# Patient Record
Sex: Male | Born: 1935 | Race: White | Hispanic: No | Marital: Married | Smoking: Former smoker
Health system: Southern US, Community
[De-identification: ages and names within clinical notes are randomized; demographics above are authoritative.]

## PROBLEM LIST (undated history)

## (undated) DIAGNOSIS — N3941 Urge incontinence: Secondary | ICD-10-CM

## (undated) DIAGNOSIS — I639 Cerebral infarction, unspecified: Secondary | ICD-10-CM

## (undated) DIAGNOSIS — J189 Pneumonia, unspecified organism: Secondary | ICD-10-CM

## (undated) DIAGNOSIS — B159 Hepatitis A without hepatic coma: Secondary | ICD-10-CM

## (undated) DIAGNOSIS — F32A Depression, unspecified: Secondary | ICD-10-CM

## (undated) DIAGNOSIS — M5430 Sciatica, unspecified side: Secondary | ICD-10-CM

## (undated) DIAGNOSIS — M810 Age-related osteoporosis without current pathological fracture: Secondary | ICD-10-CM

## (undated) DIAGNOSIS — I2510 Atherosclerotic heart disease of native coronary artery without angina pectoris: Secondary | ICD-10-CM

## (undated) DIAGNOSIS — E785 Hyperlipidemia, unspecified: Secondary | ICD-10-CM

## (undated) DIAGNOSIS — M1990 Unspecified osteoarthritis, unspecified site: Secondary | ICD-10-CM

## (undated) DIAGNOSIS — E119 Type 2 diabetes mellitus without complications: Secondary | ICD-10-CM

## (undated) DIAGNOSIS — R6882 Decreased libido: Secondary | ICD-10-CM

## (undated) DIAGNOSIS — IMO0002 Reserved for concepts with insufficient information to code with codable children: Secondary | ICD-10-CM

## (undated) DIAGNOSIS — Z9289 Personal history of other medical treatment: Secondary | ICD-10-CM

## (undated) DIAGNOSIS — L97429 Non-pressure chronic ulcer of left heel and midfoot with unspecified severity: Secondary | ICD-10-CM

## (undated) DIAGNOSIS — I10 Essential (primary) hypertension: Secondary | ICD-10-CM

## (undated) DIAGNOSIS — F329 Major depressive disorder, single episode, unspecified: Secondary | ICD-10-CM

## (undated) DIAGNOSIS — R9430 Abnormal result of cardiovascular function study, unspecified: Secondary | ICD-10-CM

## (undated) DIAGNOSIS — K317 Polyp of stomach and duodenum: Secondary | ICD-10-CM

## (undated) DIAGNOSIS — J449 Chronic obstructive pulmonary disease, unspecified: Secondary | ICD-10-CM

## (undated) DIAGNOSIS — R42 Dizziness and giddiness: Secondary | ICD-10-CM

## (undated) DIAGNOSIS — K219 Gastro-esophageal reflux disease without esophagitis: Secondary | ICD-10-CM

## (undated) DIAGNOSIS — N529 Male erectile dysfunction, unspecified: Secondary | ICD-10-CM

## (undated) DIAGNOSIS — C61 Malignant neoplasm of prostate: Secondary | ICD-10-CM

## (undated) DIAGNOSIS — C679 Malignant neoplasm of bladder, unspecified: Secondary | ICD-10-CM

## (undated) HISTORY — PX: CARPAL TUNNEL RELEASE: SHX101

## (undated) HISTORY — PX: HERNIA REPAIR: SHX51

## (undated) HISTORY — DX: Non-pressure chronic ulcer of left heel and midfoot with unspecified severity: L97.429

## (undated) HISTORY — PX: PENILE PROSTHESIS IMPLANT: SHX240

## (undated) HISTORY — PX: OTHER SURGICAL HISTORY: SHX169

## (undated) HISTORY — DX: Type 2 diabetes mellitus without complications: E11.9

## (undated) HISTORY — PX: URINARY SPHINCTER IMPLANT: SHX2624

## (undated) HISTORY — PX: PROSTATECTOMY: SHX69

## (undated) HISTORY — PX: MIDDLE EAR SURGERY: SHX713

## (undated) HISTORY — DX: Chronic obstructive pulmonary disease, unspecified: J44.9

## (undated) HISTORY — PX: BLADDER SURGERY: SHX569

## (undated) HISTORY — PX: FINGER SURGERY: SHX640

## (undated) HISTORY — PX: APPENDECTOMY: SHX54

---

## 1994-09-30 DIAGNOSIS — I2510 Atherosclerotic heart disease of native coronary artery without angina pectoris: Secondary | ICD-10-CM

## 1994-09-30 HISTORY — PX: CORONARY ANGIOPLASTY: SHX604

## 1994-09-30 HISTORY — DX: Atherosclerotic heart disease of native coronary artery without angina pectoris: I25.10

## 1996-09-30 HISTORY — PX: CATARACT EXTRACTION W/ INTRAOCULAR LENS  IMPLANT, BILATERAL: SHX1307

## 1998-03-07 ENCOUNTER — Encounter: Admission: RE | Admit: 1998-03-07 | Discharge: 1998-06-05 | Payer: Self-pay | Admitting: Internal Medicine

## 1998-05-01 ENCOUNTER — Ambulatory Visit (HOSPITAL_COMMUNITY): Admission: RE | Admit: 1998-05-01 | Discharge: 1998-05-01 | Payer: Self-pay | Admitting: Cardiovascular Disease

## 1998-08-29 ENCOUNTER — Encounter: Admission: RE | Admit: 1998-08-29 | Discharge: 1998-11-27 | Payer: Self-pay | Admitting: Anesthesiology

## 1999-09-26 ENCOUNTER — Encounter: Admission: RE | Admit: 1999-09-26 | Discharge: 1999-10-19 | Payer: Self-pay | Admitting: Orthopedic Surgery

## 2000-01-29 ENCOUNTER — Encounter: Admission: RE | Admit: 2000-01-29 | Discharge: 2000-01-29 | Payer: Self-pay | Admitting: Urology

## 2000-01-29 ENCOUNTER — Encounter: Payer: Self-pay | Admitting: Urology

## 2000-04-08 ENCOUNTER — Ambulatory Visit (HOSPITAL_COMMUNITY): Admission: RE | Admit: 2000-04-08 | Discharge: 2000-04-08 | Payer: Self-pay | Admitting: Gastroenterology

## 2000-06-19 ENCOUNTER — Encounter: Admission: RE | Admit: 2000-06-19 | Discharge: 2000-06-19 | Payer: Self-pay | Admitting: Urology

## 2000-06-19 ENCOUNTER — Encounter: Payer: Self-pay | Admitting: Urology

## 2000-07-08 ENCOUNTER — Encounter: Admission: RE | Admit: 2000-07-08 | Discharge: 2000-07-08 | Payer: Self-pay | Admitting: Urology

## 2000-07-08 ENCOUNTER — Encounter: Payer: Self-pay | Admitting: Urology

## 2000-10-08 ENCOUNTER — Encounter: Admission: RE | Admit: 2000-10-08 | Discharge: 2001-01-06 | Payer: Self-pay | Admitting: Internal Medicine

## 2001-04-01 ENCOUNTER — Encounter: Admission: RE | Admit: 2001-04-01 | Discharge: 2001-04-06 | Payer: Self-pay | Admitting: Internal Medicine

## 2001-11-27 HISTORY — PX: CARDIAC CATHETERIZATION: SHX172

## 2002-07-06 ENCOUNTER — Ambulatory Visit (HOSPITAL_COMMUNITY): Admission: RE | Admit: 2002-07-06 | Discharge: 2002-07-06 | Payer: Self-pay | Admitting: Gastroenterology

## 2002-07-13 ENCOUNTER — Ambulatory Visit (HOSPITAL_COMMUNITY): Admission: RE | Admit: 2002-07-13 | Discharge: 2002-07-13 | Payer: Self-pay | Admitting: Internal Medicine

## 2002-07-13 ENCOUNTER — Encounter: Payer: Self-pay | Admitting: Internal Medicine

## 2002-08-03 ENCOUNTER — Encounter: Payer: Self-pay | Admitting: Internal Medicine

## 2002-08-03 ENCOUNTER — Ambulatory Visit (HOSPITAL_COMMUNITY): Admission: RE | Admit: 2002-08-03 | Discharge: 2002-08-03 | Payer: Self-pay | Admitting: Internal Medicine

## 2002-08-09 ENCOUNTER — Ambulatory Visit: Admission: RE | Admit: 2002-08-09 | Discharge: 2002-09-06 | Payer: Self-pay | Admitting: Radiation Oncology

## 2002-08-18 ENCOUNTER — Encounter: Payer: Self-pay | Admitting: Radiation Oncology

## 2002-08-18 ENCOUNTER — Ambulatory Visit (HOSPITAL_COMMUNITY): Admission: RE | Admit: 2002-08-18 | Discharge: 2002-08-18 | Payer: Self-pay | Admitting: Radiation Oncology

## 2002-09-21 ENCOUNTER — Ambulatory Visit (HOSPITAL_COMMUNITY): Admission: RE | Admit: 2002-09-21 | Discharge: 2002-09-21 | Payer: Self-pay | Admitting: Gastroenterology

## 2002-09-21 ENCOUNTER — Encounter: Payer: Self-pay | Admitting: Gastroenterology

## 2002-10-19 ENCOUNTER — Ambulatory Visit (HOSPITAL_COMMUNITY): Admission: RE | Admit: 2002-10-19 | Discharge: 2002-10-19 | Payer: Self-pay | Admitting: Gastroenterology

## 2002-10-19 ENCOUNTER — Encounter: Payer: Self-pay | Admitting: Gastroenterology

## 2002-11-25 ENCOUNTER — Ambulatory Visit (HOSPITAL_COMMUNITY): Admission: RE | Admit: 2002-11-25 | Discharge: 2002-11-25 | Payer: Self-pay | Admitting: Internal Medicine

## 2003-04-25 ENCOUNTER — Encounter: Payer: Self-pay | Admitting: Internal Medicine

## 2003-04-25 ENCOUNTER — Ambulatory Visit (HOSPITAL_COMMUNITY): Admission: RE | Admit: 2003-04-25 | Discharge: 2003-04-25 | Payer: Self-pay | Admitting: Internal Medicine

## 2003-06-28 ENCOUNTER — Ambulatory Visit (HOSPITAL_BASED_OUTPATIENT_CLINIC_OR_DEPARTMENT_OTHER): Admission: RE | Admit: 2003-06-28 | Discharge: 2003-06-28 | Payer: Self-pay | Admitting: Internal Medicine

## 2003-09-07 ENCOUNTER — Encounter: Admission: RE | Admit: 2003-09-07 | Discharge: 2003-09-07 | Payer: Self-pay | Admitting: Orthopedic Surgery

## 2003-11-21 ENCOUNTER — Emergency Department (HOSPITAL_COMMUNITY): Admission: EM | Admit: 2003-11-21 | Discharge: 2003-11-21 | Payer: Self-pay | Admitting: Family Medicine

## 2003-11-25 ENCOUNTER — Ambulatory Visit (HOSPITAL_COMMUNITY): Admission: RE | Admit: 2003-11-25 | Discharge: 2003-11-25 | Payer: Self-pay | Admitting: Internal Medicine

## 2004-06-07 ENCOUNTER — Encounter (INDEPENDENT_AMBULATORY_CARE_PROVIDER_SITE_OTHER): Payer: Self-pay | Admitting: Specialist

## 2004-06-07 ENCOUNTER — Observation Stay (HOSPITAL_COMMUNITY): Admission: RE | Admit: 2004-06-07 | Discharge: 2004-06-08 | Payer: Self-pay | Admitting: Orthopedic Surgery

## 2004-08-09 ENCOUNTER — Ambulatory Visit (HOSPITAL_COMMUNITY): Admission: RE | Admit: 2004-08-09 | Discharge: 2004-08-09 | Payer: Self-pay | Admitting: Neurology

## 2004-10-15 ENCOUNTER — Ambulatory Visit (HOSPITAL_COMMUNITY): Admission: RE | Admit: 2004-10-15 | Discharge: 2004-10-15 | Payer: Self-pay | Admitting: Urology

## 2004-10-15 ENCOUNTER — Encounter (INDEPENDENT_AMBULATORY_CARE_PROVIDER_SITE_OTHER): Payer: Self-pay | Admitting: Specialist

## 2004-10-15 ENCOUNTER — Ambulatory Visit (HOSPITAL_BASED_OUTPATIENT_CLINIC_OR_DEPARTMENT_OTHER): Admission: RE | Admit: 2004-10-15 | Discharge: 2004-10-15 | Payer: Self-pay | Admitting: Urology

## 2004-11-28 ENCOUNTER — Emergency Department (HOSPITAL_COMMUNITY): Admission: EM | Admit: 2004-11-28 | Discharge: 2004-11-28 | Payer: Self-pay | Admitting: Emergency Medicine

## 2005-01-08 ENCOUNTER — Ambulatory Visit: Payer: Self-pay | Admitting: Internal Medicine

## 2005-04-19 ENCOUNTER — Ambulatory Visit: Payer: Self-pay | Admitting: Internal Medicine

## 2005-05-21 ENCOUNTER — Encounter: Admission: RE | Admit: 2005-05-21 | Discharge: 2005-05-21 | Payer: Self-pay | Admitting: Neurology

## 2005-06-05 ENCOUNTER — Encounter: Admission: RE | Admit: 2005-06-05 | Discharge: 2005-06-05 | Payer: Self-pay | Admitting: Specialist

## 2006-03-09 ENCOUNTER — Emergency Department (HOSPITAL_COMMUNITY): Admission: EM | Admit: 2006-03-09 | Discharge: 2006-03-10 | Payer: Self-pay | Admitting: Emergency Medicine

## 2006-05-28 ENCOUNTER — Ambulatory Visit: Payer: Self-pay | Admitting: Internal Medicine

## 2006-07-22 ENCOUNTER — Ambulatory Visit: Payer: Self-pay | Admitting: Internal Medicine

## 2006-08-19 ENCOUNTER — Ambulatory Visit: Payer: Self-pay | Admitting: Internal Medicine

## 2007-03-10 ENCOUNTER — Encounter: Admission: RE | Admit: 2007-03-10 | Discharge: 2007-03-10 | Payer: Self-pay | Admitting: Urology

## 2007-03-12 ENCOUNTER — Ambulatory Visit (HOSPITAL_COMMUNITY): Admission: RE | Admit: 2007-03-12 | Discharge: 2007-03-12 | Payer: Self-pay | Admitting: Urology

## 2007-04-02 ENCOUNTER — Ambulatory Visit: Payer: Self-pay | Admitting: Internal Medicine

## 2007-05-05 ENCOUNTER — Ambulatory Visit: Payer: Self-pay | Admitting: Internal Medicine

## 2007-09-28 ENCOUNTER — Emergency Department (HOSPITAL_COMMUNITY): Admission: EM | Admit: 2007-09-28 | Discharge: 2007-09-28 | Payer: Self-pay | Admitting: Emergency Medicine

## 2007-10-17 ENCOUNTER — Emergency Department (HOSPITAL_COMMUNITY): Admission: EM | Admit: 2007-10-17 | Discharge: 2007-10-17 | Payer: Self-pay | Admitting: *Deleted

## 2007-10-27 ENCOUNTER — Encounter: Admission: RE | Admit: 2007-10-27 | Discharge: 2007-10-27 | Payer: Self-pay | Admitting: Orthopaedic Surgery

## 2007-11-23 ENCOUNTER — Encounter: Admission: RE | Admit: 2007-11-23 | Discharge: 2007-11-23 | Payer: Self-pay | Admitting: Orthopaedic Surgery

## 2007-11-26 ENCOUNTER — Encounter: Admission: RE | Admit: 2007-11-26 | Discharge: 2007-11-26 | Payer: Self-pay | Admitting: Dentistry

## 2007-11-26 ENCOUNTER — Ambulatory Visit: Payer: Self-pay | Admitting: Dentistry

## 2008-03-11 ENCOUNTER — Emergency Department (HOSPITAL_COMMUNITY): Admission: EM | Admit: 2008-03-11 | Discharge: 2008-03-11 | Payer: Self-pay | Admitting: Emergency Medicine

## 2008-05-19 ENCOUNTER — Encounter (HOSPITAL_COMMUNITY): Admission: RE | Admit: 2008-05-19 | Discharge: 2008-06-16 | Payer: Self-pay | Admitting: Urology

## 2008-05-27 ENCOUNTER — Encounter (HOSPITAL_COMMUNITY): Admission: RE | Admit: 2008-05-27 | Discharge: 2008-06-16 | Payer: Self-pay | Admitting: Urology

## 2008-09-14 ENCOUNTER — Encounter: Admission: RE | Admit: 2008-09-14 | Discharge: 2008-09-14 | Payer: Self-pay | Admitting: Orthopaedic Surgery

## 2008-11-14 ENCOUNTER — Encounter: Admission: RE | Admit: 2008-11-14 | Discharge: 2008-12-26 | Payer: Self-pay | Admitting: Neurology

## 2008-11-27 ENCOUNTER — Emergency Department (HOSPITAL_COMMUNITY): Admission: EM | Admit: 2008-11-27 | Discharge: 2008-11-27 | Payer: Self-pay | Admitting: Emergency Medicine

## 2009-02-12 ENCOUNTER — Emergency Department (HOSPITAL_COMMUNITY): Admission: EM | Admit: 2009-02-12 | Discharge: 2009-02-12 | Payer: Self-pay | Admitting: Emergency Medicine

## 2009-06-12 HISTORY — PX: OTHER SURGICAL HISTORY: SHX169

## 2009-07-17 ENCOUNTER — Encounter: Admission: RE | Admit: 2009-07-17 | Discharge: 2009-09-14 | Payer: Self-pay | Admitting: Chiropractic Medicine

## 2009-10-08 ENCOUNTER — Emergency Department (HOSPITAL_COMMUNITY): Admission: EM | Admit: 2009-10-08 | Discharge: 2009-10-08 | Payer: Self-pay | Admitting: Emergency Medicine

## 2010-06-14 ENCOUNTER — Emergency Department (HOSPITAL_COMMUNITY): Admission: EM | Admit: 2010-06-14 | Discharge: 2010-06-14 | Payer: Self-pay | Admitting: Emergency Medicine

## 2010-10-20 ENCOUNTER — Encounter: Payer: Self-pay | Admitting: Neurology

## 2010-10-20 ENCOUNTER — Encounter: Payer: Self-pay | Admitting: Orthopedic Surgery

## 2010-12-16 LAB — CBC
HCT: 31.5 % — ABNORMAL LOW (ref 39.0–52.0)
Hemoglobin: 10.6 g/dL — ABNORMAL LOW (ref 13.0–17.0)
RBC: 3.2 MIL/uL — ABNORMAL LOW (ref 4.22–5.81)
RDW: 13 % (ref 11.5–15.5)
WBC: 6.2 10*3/uL (ref 4.0–10.5)

## 2010-12-16 LAB — BASIC METABOLIC PANEL
Calcium: 9.2 mg/dL (ref 8.4–10.5)
GFR calc Af Amer: >60 mL/min (ref >60)
GFR calc non Af Amer: 53 mL/min — ABNORMAL LOW (ref >60)
Glucose, Bld: 152 mg/dL — ABNORMAL HIGH (ref 70–99)
Potassium: 4 mEq/L (ref 3.5–5.1)
Sodium: 135 mEq/L (ref 135–145)

## 2010-12-16 LAB — DIFFERENTIAL
Basophils Absolute: 0 10*3/uL (ref 0.0–0.1)
Eosinophils Relative: 4 % (ref 0–5)
Lymphocytes Relative: 27 % (ref 12–46)
Lymphs Abs: 1.7 10*3/uL (ref 0.7–4.0)
Monocytes Absolute: 0.7 10*3/uL (ref 0.1–1.0)
Monocytes Relative: 11 % (ref 3–12)
Neutro Abs: 3.6 10*3/uL (ref 1.7–7.7)

## 2011-02-12 NOTE — Assessment & Plan Note (Signed)
[facility]                             PULMONARY OFFICE NOTE   NAME:Guy [name], Guy [name]                       MRN:          [id number]  DATE:05/05/2007                            DOB:          Feb 01, 1920    PROBLEM:  1. Prostate cancer, left upper lobe nodule.  2. Dyspnea/asthma/chronic obstructive pulmonary disease.  3. Obstructive sleep apnea.  4. Esophageal reflux.  5. Coronary disease/stent.  6. Diabetes.   HISTORY:  He says his PSA is going up. Breathing is ok. He is aware of  some nasal septal deviation and has an appointment pending to see Dr.  Osborn Coho. His wife is worried that this will represent a  significant respiratory impairment for him and a reassured her. He had  had allergy testing in the past year and apparently no specific therapy  was indicated from that.   MEDICATIONS:  His list is charted and reviewed without significant  change.   OBJECTIVE:  Weight 169 pounds, blood pressure 122/60, pulse 60, room air  saturation 98%. Pleasant, relaxed, gentleman work of breathing is not  increased.  LUNG FIELDS: Very clear.  HEART SOUNDS: Regular without murmur.  There is a little ptosis at the tip of his nose but he can breath  through his nose with his mouth closed.   IMPRESSION:  1. Lung nodule, biopsy proven to be prostate cancer.  2. Chronic obstructive pulmonary disease with mild dyspnea.   PLAN:  1. Keep appointment with Dr. Annalee Genta.  2. Note that the chest x-ray on July 3 at the [facility] radiology      facility showed mild increase in size of left lung nodule compared      with July 22, 2006. It now measures 12.2 mm      compared with 10.5 mm previously. There is also wedge compression      deformity in the upper thoracic spine which is stable. Plan return      4 months follow up, earlier p.r.n.     Clinton D. Maple Hudson, MD, Tonny Bollman, FACP  Electronically Signed    CDY/MedQ  DD: 05/05/2007  DT: 05/06/2007  Job #:  [id number]   cc:   Veverly Fells. Altheimer, M.D.  Valetta Fuller, M.D.  Nanetta Batty, M.D.

## 2011-02-12 NOTE — Discharge Summary (Signed)
NAMEBYNUM, MCCULLARS NO.:  [id number]   MEDICAL RECORD NO.:  [id number]          PATIENT TYPE:  EMS   LOCATION:  ED                           FACILITY:  [facility]   PHYSICIAN:  Sheppard Penton. Stacie Acres, M.D.  DATE OF BIRTH:  Feb 03, 1920   DATE OF ADMISSION:  10/17/2007  DATE OF DISCHARGE:  10/17/2007                               DISCHARGE SUMMARY   COMPLAINT:  Varicose vein bleeding.   HISTORY OF PRESENT ILLNESS:  An 77 year old white male who was getting a  shower and developed bleeding from a varicose vein in his right thigh.  He applied pressure. Could not stop the bleeding. EMS was called. They  applied a tight wrap and the bleeding was controlled. He decided to come  to the emergency department for evaluation. This has happened before.  Denies any chest pain or shortness of breath, or other bleeding  problems.   PAST MEDICAL HISTORY:  Hypertension, diabetes, metastatic cancer.   MEDICATIONS:  Aspirin daily.   SOCIAL HISTORY:  Married. Nonsmoker, nondrinker. No drug use.   FAMILY HISTORY:  Noncontributory.   MEDICATIONS:  Avandia, glyburide, aspirin, Diazepam, Meclizine. Other  med's noted on the ED record.   ALLERGIES:  PENICILLIN, SULFA, ALBUTEROL.   REVIEW OF SYSTEMS:  Other than the above, all systems negative.   PHYSICAL EXAMINATION:  VITAL SIGNS:  Nurses notes reviewed. Blood  pressure 137/67, pulse 67, respiratory rate 20. Temperature 96.9. Pulse  ox 99%.  GENERAL:  Awake, alert, cooperative. [facility] 9. Well developed,  well nourished. No acute distress.  NEUROLOGIC:  Affect and judgment appropriate with age.  EXTREMITIES:  The bandage that EMS had put on was removed. There was a  slight amount of oozing at a vein sight in the right media thigh. There  was no spurting of blood. Distal pulses are intact. Bleeding site noted.   EMERGENCY DEPARTMENT COURSE:  A thrombin pad was put on the site and a  Cobain dressing was then applied.   PLAN:   Observe the patient in the ED to make sure the bleeding is  controlled.     Sheppard Penton. Stacie Acres, M.D.  Electronically Signed    NMM/MEDQ  D:  10/17/2007  T:  10/17/2007  Job:  [id number]

## 2011-02-12 NOTE — Assessment & Plan Note (Signed)
[facility]                             PULMONARY OFFICE NOTE   NAME:Soileau, ELLIE SPICKLER                       MRN:          [id number]  DATE:04/02/2007                            DOB:          08/15/1920    PULMONARY OFFICE FOLLOWUP   PROBLEMS:  1. Prostate cancer, left upper lobe nodule.  2. Dyspnea/asthma/chronic obstructive pulmonary disease.  3. Obstructive sleep apnea.  4. Esophageal reflux.  5. Coronary disease/stent.  6. Diabetes.   HISTORY:  He still comments that he gets short of breath easily while  talking on the telephone, but there has been no change in this pattern  over several years.  He reports his PSA is going up again.  Bone scan  was negative.  Our last chest x-ray here in October was unchanged at  that time.  He has had some left breast tenderness and has had a  mammogram.  No cough, wheeze, or phlegm.   MEDICATION LIST:  Charted and reviewed.   OBJECTIVE:  BP 130/54, pulse 54, room air saturation 97%.  Breath sounds are diminished without wheeze, cough, or rales.  I do not  find adenopathy.  HEART:  Sounds are regular without murmur or gallop.  There is no cyanosis or clubbing.   IMPRESSION:  1. Chronic obstructive pulmonary disease with chronic stable dyspnea.  2. Lung nodule with documented prostate cancer.   PLAN:  Chest x-ray with office followup in 1 month, earlier p.r.n.     Clinton D. Maple Hudson, MD, Tonny Bollman, FACP  Electronically Signed    CDY/MedQ  DD: 04/04/2007  DT: 04/04/2007  Job #: [id number]   cc:   Veverly Fells. Altheimer, M.D.  Valetta Fuller, M.D.

## 2011-02-12 NOTE — Cardiovascular Report (Signed)
NAMELLEYTON, BYERS NO.:  [id number]   MEDICAL RECORD NO.:  [id number]          PATIENT TYPE:  EMS   LOCATION:  ED                           FACILITY:  [facility]   PHYSICIAN:  Sheppard Penton. Stacie Acres, M.D.  DATE OF BIRTH:  1920/03/19   DATE OF PROCEDURE:  DATE OF DISCHARGE:  10/17/2007                            CARDIAC CATHETERIZATION   COMPLAINT/>  Varicose vein bleeding.   HISTORY OF PRESENT ILLNESS:  An 77 year old white male who was getting a  shower and developed bleeding from a varicose vein in his right thigh.  He applied pressure. Could not stop the bleeding. EMS was called. They  applied a tight wrap and the bleeding was controlled. He decided to come  to the emergency department for evaluation. This has happened before.  Denies any chest pain or shortness of breath, or other bleeding  problems.   PAST MEDICAL HISTORY:  Hypertension, diabetes, metastatic cancer.   MEDICATIONS:  Aspirin daily.   SOCIAL HISTORY:  Married. Nonsmoker, nondrinker. No drug use.   FAMILY HISTORY:  Noncontributory.   MEDICATIONS:  Avandia, glyburide, aspirin, Diazepam, Meclizine. Other  med's noted on the ED record.   ALLERGIES:  PENICILLIN, SULFA, ALBUTEROL.   REVIEW OF SYSTEMS:  Other than the above, all systems negative.   PHYSICAL EXAMINATION:  VITAL SIGNS:  Nurses notes reviewed. Blood  pressure 137/67, pulse 67, respiratory rate 20. Temperature 96.9. Pulse  ox 99%.  GENERAL:  Awake, alert, cooperative. [facility] 9. Well developed,  well nourished. No acute distress.  NEUROLOGIC:  Affect and judgment appropriate with age.  EXTREMITIES:  The bandage that EMS had put on was removed. There was a  slight amount of oozing at a vein sight in the right medial thigh. There  was no spurting of blood. Distal pulses are intact. Bleeding site noted.   EMERGENCY DEPARTMENT COURSE:  A thrombin pad was put on the site and a  Cobain dressing was then applied.   PLAN:  Observe  the patient in the ED to make sure the bleeding is  controlled.      Sheppard Penton. Stacie Acres, M.D.  Electronically Signed     NMM/MEDQ  D:  10/17/2007  T:  10/22/2007  Job:  [id number]

## 2011-02-15 NOTE — Procedures (Signed)
[facility]  Patient:    GEN, CLAGG                       MRN: [id number] Proc. Date: 04/08/00 Adm. Date:  [id number] Attending:  Deneen Harts CC:         Veverly Fells. Altheimer, M.D.                           Procedure Report  PROCEDURE PERFORMED:  Panendoscopy.  ENDOSCOPIST:  Griffith Citron, M.D.  INDICATIONS FOR PROCEDURE:  The patient is a 77 year old male undergoing endoscopy to evaluate refractory symptoms of nausea, intermittent epigastric pain, weight loss of 20 pounds over the past several months.  The patient underwent endoscopy May of 1999 at which the findings included hiatal hernia, fundal gastric polyps which were biopsied and found to be benign and moderate antritis.  The patient was continued on Prevacid 30 mg b.i.d. over recent weeks.  Symptoms persisted despite this medication.  DESCRIPTION OF PROCEDURE:  After reviewing the nature of the procedure with the patient including potential risks and complications, and after discussing alternative methods of diagnosis and treatment, informed consent was signed.  Premedicated with topical anesthetic followed by IV sedation totalling Versed 5 mg, fentanyl 50 mcg IV.  Using an Olympus video endoscope, proximal esophagus intubated under direct vision.  Normal oropharynx without lesion of the epiglottis, vocal cords or piriform sinus.  Proximal, mid and distal segments of the esophagus were normal.  The mucosal Z-line was distinct at 35 cm.  Small hiatal hernia extending to 38 cm, noninflamed.  Gastric fundus notable for a half dozen diminutive 3 to 4 mm fundal polyps, benign-appearing, unchanged from prior endoscopy.  Remainder of the body and antrum were normal.  Pylorus symmetric.  Duodenal bulb and second portion were normal.  Retroflex view of the angularis, lesser curve, gastric cardia and fundus revealed the hiatal hernia defect.  Fundal polyps were seen best in  this projection.  Stomach was decompressed, scope withdrawn.  The patient tolerated the procedure without difficulty being maintained on Datascope monitor and low-flow oxygen throughout.  ASSESSMENT: 1. Hiatal hernia--small, noninflamed. 2. Fundal gastric polyps--benign, unchanged over the past two years. 3. Previously seen gastritis, now resolved.  RECOMMENDATIONS: 1. Antireflux measures. 2. Consider a trial off of Prevacid as this medication can be associated with    nausea and abdominal pain. 3. Can consider abdominal CT to rule out pancreatic carcinoma of weight loss    progresses. DD:  04/08/00 TD:  04/08/00 Job: [id number]

## 2011-02-15 NOTE — Consult Note (Signed)
[facility]  Patient:    Guy [name], Guy [name]                       MRN: [id number] Proc. Date: 10/08/00 Adm. Date:  [id number] Attending:  Sharren Bridge CC:         Veverly Fells. Altheimer, M.D.                          Consultation Report  HISTORY:  This 77 year old male, with longstanding type 2 diabetes and multiple other medical problems, is referred here for dryness of the skin of the feet and also both metatarsal and posterior foot pain.  As indicated above, the patient has had type 2 diabetes for approximately 10 years.  He also has a history of coronary heart disease, carcinoma of the prostate and carcinoma of the lung.  He has never had previous ulceration of the feet; has had some mild callus formation.  He has noted dry skin on his feet for a number of months and for the past six to eight months, has had considerable metatarsal pain, particularly involving the first and second metatarsal heads bilaterally.  As a presumably separate problem, he has also had some pain in the posterior aspect of the foot which has been attributed to plantar fasciitis and for which he has used heel inserts in his shoes; interestingly, with so doing, he has noted that the forefoot pain in the metatarsal areas has worsened.  PRESENT MEDICATIONS:  The patients regular medications are numerous in number and include Avapro, Prevacid, glyburide, Vancenase spray, simvastatin, aspirin Miacalcin spray, vitamin D and calcium.  ALLERGIES:  He is allergic to PENICILLIN and SULFA.  EXAMINATION  EXTREMITIES:  Examination today is limited to the distal lower extremities. The feet are without gross deformity and there is no significant edema.  The nails are thickened from chronic fungal disease and dysplasia bilaterally. There is no apparent ingrowing of the nails.  He does have palpable pulses at all locations in his feet and the skin temperatures are adequate  and symmetrical throughout both feet.  Monofilament testing shows the preservation of protective sensation throughout.  He does have slight callus formation underlying the first metatarsal head on the plantar aspect of the left foot.  Despite the fact that he does not have particularly substantial clawing of the toes, there is some distal migration of the metatarsal fat pads so that there is very little soft tissue between the metatarsal heads and the floor, so to speak.  He is rather tender in these areas, particularly the first and second metatarsal head areas bilaterally, and there is some question that perhaps a sesamoid bone can be palpated at the first metatarsal head areas bilaterally as well.  He is also tender at the anterior aspect of the os calcis bilaterally, consistent with his known plantar fasciitis.  IMPRESSION 1. Metatarsalgia likely more secondary to anterior fat pad migration and    possible sesamoid bone formation than to diabetic nephropathy. 2. Plantar fasciitis. 3. 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  RECOMMENDATION 1. The callus underlying the first metatarsal head on the left foot is sharply    pared without incident. 2. The patient is advised that he needs a remake of his footwear to provide    significant metatarsal posting and more adequate cushioning to all areas of    the foot with more even distribution of weightbearing throughout surfaces  of both feet. 3. He is also advised that he may ultimately need x-rays for the detection of    sesamoid bones and possible resection of those bones. 4. He is advised not to use the inserts that he has for his plantar fasciitis    in addition to the custom inserts we will provide, but that they should    serve both purposes. 5. He is in agreement with the proposed plan of action and will consult [facility] on [address] for preparation of the appropriate inserts    and for  evaluation of the adequacy of width and depth of his footwear. 6. The patient was given general instruction regarding foot care by video    instruction with nurse and limited physician reinforcement. 7. Followup visit will be to this clinic in five weeks after he has had time    to obtain and wear his new footwear. DD:  10/15/00 TD:  10/16/00 Job: [id number]

## 2011-02-15 NOTE — Op Note (Signed)
[name], [name] NO.:  [id number]   MEDICAL RECORD NO.:  [id number]          PATIENT TYPE:  AMB   LOCATION:  [facility]                         FACILITY:  [facility]   PHYSICIAN:  Valetta Fuller, M.D.  DATE OF BIRTH:  16-Sep-1920   DATE OF PROCEDURE:  10/15/2004  DATE OF DISCHARGE:                                 OPERATIVE REPORT   PREOPERATIVE DIAGNOSIS:  Bladder tumor.   POSTOPERATIVE DIAGNOSIS:  Bladder tumor.   PROCEDURE PERFORMED:  Cystoscopy, bladder biopsy x2 with fulguration.   SURGEON:  Valetta Fuller, M.D.   ANESTHESIA:  General.   INDICATIONS:  Guy [name] is an 77 year old male with a complex urologic  history. He has a history of metastatic adenocarcinoma of the prostate.  Recently he was noted to have some microhematuria and flexible cystoscopy  was performed in the office. This showed what appeared to be a small 5-mm  papillary tumor just above his left ureteral orifice. He presents now for  biopsy and fulguration of this.   TECHNIQUE AND FINDINGS:  The patient was brought to the operating room where  he had successful induction of general anesthesia. He was placed in  lithotomy position and prepped and draped in the usual manner. The patient  had deactivation of his urinary sphincter apparatus. The cystoscope was then  inserted. He had no evidence of urethral stricture or bladder neck  contracture. Careful inspection of the bladder with both lens systems  revealed again just a 5 mm papillary tumor above his left ureteral orifice.  There were no other obvious tumors. There was an area of slight increased  erythema in his trigone. Utilizing a cold cup,  I took a biopsy which  removed the complete tumor in the left orifice region and I also took a cold  cup biopsy of the trigone. These were sent separately. The areas were  fulgurated and hemostasis was excellent. At the completion of the procedure,  we drained his bladder. I reactivated his sphincter  unit and he was brought  to the recovery room in stable condition.      DSG/MEDQ  D:  10/16/2004  T:  10/16/2004  Job:  [id number]

## 2011-04-17 ENCOUNTER — Other Ambulatory Visit: Payer: Self-pay | Admitting: *Deleted

## 2011-04-17 DIAGNOSIS — M545 Low back pain: Secondary | ICD-10-CM

## 2011-04-18 ENCOUNTER — Ambulatory Visit
Admission: RE | Admit: 2011-04-18 | Discharge: 2011-04-18 | Disposition: A | Discharge status: Home / Self Care | Payer: Medicare Other | Source: Ambulatory Visit | Attending: *Deleted | Admitting: *Deleted

## 2011-04-18 DIAGNOSIS — M545 Low back pain: Secondary | ICD-10-CM

## 2011-06-27 ENCOUNTER — Other Ambulatory Visit (HOSPITAL_COMMUNITY): Payer: Self-pay | Admitting: Urology

## 2011-06-27 ENCOUNTER — Other Ambulatory Visit: Payer: Self-pay | Admitting: Urology

## 2011-06-27 ENCOUNTER — Ambulatory Visit (HOSPITAL_COMMUNITY)
Admission: RE | Admit: 2011-06-27 | Discharge: 2011-06-27 | Disposition: A | Discharge status: Home / Self Care | Payer: Medicare Other | Source: Ambulatory Visit | Attending: Urology | Admitting: Urology

## 2011-06-27 ENCOUNTER — Encounter (HOSPITAL_COMMUNITY): Payer: Medicare Other

## 2011-06-27 DIAGNOSIS — Z01810 Encounter for preprocedural cardiovascular examination: Secondary | ICD-10-CM | POA: Insufficient documentation

## 2011-06-27 DIAGNOSIS — Z01818 Encounter for other preprocedural examination: Secondary | ICD-10-CM | POA: Insufficient documentation

## 2011-06-27 DIAGNOSIS — M8448XA Pathological fracture, other site, initial encounter for fracture: Secondary | ICD-10-CM | POA: Insufficient documentation

## 2011-06-27 DIAGNOSIS — J438 Other emphysema: Secondary | ICD-10-CM | POA: Insufficient documentation

## 2011-06-27 DIAGNOSIS — C61 Malignant neoplasm of prostate: Secondary | ICD-10-CM | POA: Insufficient documentation

## 2011-06-27 DIAGNOSIS — Z01812 Encounter for preprocedural laboratory examination: Secondary | ICD-10-CM | POA: Insufficient documentation

## 2011-06-27 DIAGNOSIS — N393 Stress incontinence (female) (male): Secondary | ICD-10-CM

## 2011-06-27 DIAGNOSIS — R911 Solitary pulmonary nodule: Secondary | ICD-10-CM | POA: Insufficient documentation

## 2011-06-27 DIAGNOSIS — R0602 Shortness of breath: Secondary | ICD-10-CM | POA: Insufficient documentation

## 2011-06-27 LAB — APTT: aPTT: 39 seconds — ABNORMAL HIGH (ref 24–37)

## 2011-06-27 LAB — COMPREHENSIVE METABOLIC PANEL
ALT: 21 U/L (ref 0–53)
AST: 37 U/L (ref 0–37)
Alkaline Phosphatase: 105 U/L (ref 39–117)
CO2: 27 mEq/L (ref 19–32)
Calcium: 9.5 mg/dL (ref 8.4–10.5)
GFR calc non Af Amer: >60 mL/min (ref >60)
Glucose, Bld: 137 mg/dL — ABNORMAL HIGH (ref 70–99)
Potassium: 4.2 mEq/L (ref 3.5–5.1)
Sodium: 139 mEq/L (ref 135–145)
Total Protein: 6.9 g/dL (ref 6.0–8.3)

## 2011-06-27 LAB — CBC
Hemoglobin: 11.8 g/dL — ABNORMAL LOW (ref 13.0–17.0)
MCH: 32.4 pg (ref 26.0–34.0)
Platelets: 105 10*3/uL — ABNORMAL LOW (ref 150–400)
RBC: 3.64 MIL/uL — ABNORMAL LOW (ref 4.22–5.81)
WBC: 6.8 10*3/uL (ref 4.0–10.5)

## 2011-06-27 LAB — PROTIME-INR
INR: 1.18 (ref 0.00–1.49)
Prothrombin Time: 15.2 seconds (ref 11.6–15.2)

## 2011-07-02 ENCOUNTER — Observation Stay (HOSPITAL_COMMUNITY)
Admission: RE | Admit: 2011-07-02 | Discharge: 2011-07-03 | Disposition: A | Discharge status: Home / Self Care | Payer: Medicare Other | Source: Ambulatory Visit | Attending: Urology | Admitting: Urology

## 2011-07-02 DIAGNOSIS — R32 Unspecified urinary incontinence: Secondary | ICD-10-CM | POA: Insufficient documentation

## 2011-07-02 DIAGNOSIS — I10 Essential (primary) hypertension: Secondary | ICD-10-CM | POA: Insufficient documentation

## 2011-07-02 DIAGNOSIS — J4489 Other specified chronic obstructive pulmonary disease: Secondary | ICD-10-CM | POA: Insufficient documentation

## 2011-07-02 DIAGNOSIS — G4733 Obstructive sleep apnea (adult) (pediatric): Secondary | ICD-10-CM | POA: Insufficient documentation

## 2011-07-02 DIAGNOSIS — T8389XA Other specified complication of genitourinary prosthetic devices, implants and grafts, initial encounter: Principal | ICD-10-CM | POA: Insufficient documentation

## 2011-07-02 DIAGNOSIS — J449 Chronic obstructive pulmonary disease, unspecified: Secondary | ICD-10-CM | POA: Insufficient documentation

## 2011-07-02 DIAGNOSIS — I2510 Atherosclerotic heart disease of native coronary artery without angina pectoris: Secondary | ICD-10-CM | POA: Insufficient documentation

## 2011-07-02 DIAGNOSIS — Y831 Surgical operation with implant of artificial internal device as the cause of abnormal reaction of the patient, or of later complication, without mention of misadventure at the time of the procedure: Secondary | ICD-10-CM | POA: Insufficient documentation

## 2011-07-02 DIAGNOSIS — Z01818 Encounter for other preprocedural examination: Secondary | ICD-10-CM | POA: Insufficient documentation

## 2011-07-02 DIAGNOSIS — Z01810 Encounter for preprocedural cardiovascular examination: Secondary | ICD-10-CM | POA: Insufficient documentation

## 2011-07-02 DIAGNOSIS — C7800 Secondary malignant neoplasm of unspecified lung: Secondary | ICD-10-CM | POA: Insufficient documentation

## 2011-07-02 DIAGNOSIS — C61 Malignant neoplasm of prostate: Secondary | ICD-10-CM | POA: Insufficient documentation

## 2011-07-02 DIAGNOSIS — Z01812 Encounter for preprocedural laboratory examination: Secondary | ICD-10-CM | POA: Insufficient documentation

## 2011-07-02 LAB — GLUCOSE, CAPILLARY
Glucose-Capillary: 138 mg/dL — ABNORMAL HIGH (ref 70–99)
Glucose-Capillary: 159 mg/dL — ABNORMAL HIGH (ref 70–99)
Glucose-Capillary: 157 mg/dL — ABNORMAL HIGH (ref 70–99)

## 2011-07-02 LAB — TYPE AND SCREEN: ABO/RH(D): O POS

## 2011-07-02 LAB — BASIC METABOLIC PANEL
CO2: 26 mEq/L (ref 19–32)
Chloride: 102 mEq/L (ref 96–112)
Glucose, Bld: 177 mg/dL — ABNORMAL HIGH (ref 70–99)
Sodium: 135 mEq/L (ref 135–145)

## 2011-07-02 LAB — ABO/RH: ABO/RH(D): O POS

## 2011-07-03 LAB — BASIC METABOLIC PANEL
GFR calc Af Amer: 61 mL/min — ABNORMAL LOW (ref >90)
GFR calc non Af Amer: 53 mL/min — ABNORMAL LOW (ref >90)
Potassium: 3.9 mEq/L (ref 3.5–5.1)
Sodium: 136 mEq/L (ref 135–145)

## 2011-07-03 LAB — HEMOGLOBIN AND HEMATOCRIT, BLOOD
HCT: 28.6 % — ABNORMAL LOW (ref 39.0–52.0)
Hemoglobin: 9.9 g/dL — ABNORMAL LOW (ref 13.0–17.0)

## 2011-07-11 NOTE — Op Note (Signed)
Guy [name], Guy [name]                ACCOUNT NO.:  [id number]  MEDICAL RECORD NO.:  [id number]  LOCATION:  1440                         FACILITY:  [facility]  PHYSICIAN:  Martina Sinner, MD DATE OF BIRTH:  [birth year]  DATE OF PROCEDURE: DATE OF DISCHARGE:                              OPERATIVE REPORT   DIAGNOSIS:  Malfunctioning artificial urinary sphincter.  POSTOPERATIVE DIAGNOSIS:  Malfunctioning artificial urinary sphincter.  SURGERY:  Replacement of artificial urinary sphincter plus cystoscopy.  SURGEON:  Deanthony Maull A. Clorine Swing, M.D.  ASSISTANT:  Delia Chimes, NP  INDICATIONS FOR PROCEDURE:  Mr. Guy [name] has had an artificial sphincter with a revision.  Based upon his perineal incision, he may have had erosion years ago and now has a more distal cuff.  He went from one pad a day to 6 or 7 pads a day acutely within approximately 24 hours and the diagnosis was likely a leak versus atrophy.  Preoperative antibiotics were given.  Preoperative laboratory tests were normal.  Extra care was taken with leg positioning, minimize the risk of compartment syndrome, neuropathy, and DVT.  He had an oblique incision left lower quadrant and has had a previous hernia repair.  He had a very long perineal incision that extends above the anus to into the scrotum and I could palpate the pump at the scrotal perineal junction.  He had a small scrotum.  He had a penile prosthesis pump in the right hemiscrotum and an artificial urinary sphincter pump in the left.  I made a 5 cm incision equally above and below the palpable cuff.  I dissected down, I used my usual retraction and I opened up the pseudocapsule of the artificial sphincter.  The artificial sphincter was quite easy to mobilize.  I clamped off a small piece of tubing, cut the tubing, and delivered the cuff, passing the Vesseloop behind the urethra.  Urethra looked healthy.  I was very careful not to put traction on it to cause  injury.  I later measured the cuff and that was 3.5 cm in size.  I decided to make my usual oblique incision with the appropriate landmarks in his abdominal skin crease.  He had a little bit of protuberant abdomen.  I dissected down to external oblique and could see the fibers.  It turned out that he had a mesh in that area, so I stayed more laterally.  There was 1 small area approximately the size of my index finger that I could easily get through and finger dissect down to the preperitoneal space.  I inserted the reservoir deflated and filled 25 cc of saline.  I closed the external oblique and a little bit of mesh, which I opened along the length of fibers with a scalpel with 2-0 Vicryl on a CT1 needle.  I then used a Senaida Ores with appropriate traction and delivered the left scrotal pump into the incision and I opened the pseudocapsule.  I removed the pump after clamping the tubing.  After I removed the pump, I attached a syringe to the blue tubing and there was 0 mL in keeping with a leak.  Redundant tubing was cut and a little bit was left  in situ along with a reservoir.  With my usual technique, I delivered the left hemiscrotum up through the abdominal incision and mobilized the subdartos pouch with a Babcock.  I then placed the pump and the dependent left hemiscrotum using the ring forceps and this went very nicely and I held in place with a loose Babcock.  Quick connects were used with tubing at appropriate length and the abdominal incision.  All connections were made.  I cycled the device 3 times.  I cystoscoped the patient and urethra was opened with the cuff open and it set down nicely with it closed.  I really felt visually and cystoscopically the 3.5 cm cuff of the appropriate cuff.  The irrigation was used for all incisions.  3-0 Vicryl in 3 layers was used for the perineal incision as well as 4-0 Vicryl subcuticular.  3-0 Vicryl 1 layer subcuticular was used the  abdominal incision followed by 4-0 subcuticular.  Dermabond and fluff dressings and mesh pants were utilized.  The cuff was deactivated at the end of the case with the 14- French catheter draining clear urine.  Blood loss was less than 50 mL. I was very pleased with downsizing of the cuff and replacement of the artificial sphincter.  Hopefully, I reaches the patient's treatment goal.          ______________________________ Martina Sinner, MD     SAM/MEDQ  D:  07/02/2011  T:  07/03/2011  Job:  [id number]  Electronically Signed by Alfredo Martinez MD on 07/11/2011 02:57:12 PM

## 2011-08-01 ENCOUNTER — Inpatient Hospital Stay (HOSPITAL_COMMUNITY)
Admission: AD | Admit: 2011-08-01 | Discharge: 2011-08-05 | DRG: 672 | Disposition: A | Discharge status: Home Health | Payer: Medicare Other | Source: Ambulatory Visit | Attending: Urology | Admitting: Urology

## 2011-08-01 DIAGNOSIS — J4489 Other specified chronic obstructive pulmonary disease: Secondary | ICD-10-CM | POA: Diagnosis present

## 2011-08-01 DIAGNOSIS — R32 Unspecified urinary incontinence: Secondary | ICD-10-CM

## 2011-08-01 DIAGNOSIS — IMO0002 Reserved for concepts with insufficient information to code with codable children: Principal | ICD-10-CM | POA: Diagnosis present

## 2011-08-01 DIAGNOSIS — I2510 Atherosclerotic heart disease of native coronary artery without angina pectoris: Secondary | ICD-10-CM | POA: Diagnosis present

## 2011-08-01 DIAGNOSIS — G4730 Sleep apnea, unspecified: Secondary | ICD-10-CM | POA: Diagnosis present

## 2011-08-01 DIAGNOSIS — Z79899 Other long term (current) drug therapy: Secondary | ICD-10-CM

## 2011-08-01 DIAGNOSIS — K219 Gastro-esophageal reflux disease without esophagitis: Secondary | ICD-10-CM | POA: Diagnosis present

## 2011-08-01 DIAGNOSIS — B9689 Other specified bacterial agents as the cause of diseases classified elsewhere: Secondary | ICD-10-CM | POA: Diagnosis present

## 2011-08-01 DIAGNOSIS — N393 Stress incontinence (female) (male): Secondary | ICD-10-CM | POA: Diagnosis present

## 2011-08-01 DIAGNOSIS — Z794 Long term (current) use of insulin: Secondary | ICD-10-CM

## 2011-08-01 DIAGNOSIS — Z87891 Personal history of nicotine dependence: Secondary | ICD-10-CM

## 2011-08-01 DIAGNOSIS — I10 Essential (primary) hypertension: Secondary | ICD-10-CM | POA: Diagnosis present

## 2011-08-01 DIAGNOSIS — Z9861 Coronary angioplasty status: Secondary | ICD-10-CM

## 2011-08-01 DIAGNOSIS — J449 Chronic obstructive pulmonary disease, unspecified: Secondary | ICD-10-CM | POA: Diagnosis present

## 2011-08-01 DIAGNOSIS — Z880 Allergy status to penicillin: Secondary | ICD-10-CM

## 2011-08-01 DIAGNOSIS — Z7982 Long term (current) use of aspirin: Secondary | ICD-10-CM

## 2011-08-01 DIAGNOSIS — Y831 Surgical operation with implant of artificial internal device as the cause of abnormal reaction of the patient, or of later complication, without mention of misadventure at the time of the procedure: Secondary | ICD-10-CM | POA: Diagnosis present

## 2011-08-01 DIAGNOSIS — E119 Type 2 diabetes mellitus without complications: Secondary | ICD-10-CM | POA: Diagnosis present

## 2011-08-01 DIAGNOSIS — T148XXA Other injury of unspecified body region, initial encounter: Secondary | ICD-10-CM

## 2011-08-01 LAB — CBC
MCH: 33 pg (ref 26.0–34.0)
MCHC: 34.2 g/dL (ref 30.0–36.0)
MCV: 96.5 fL (ref 78.0–100.0)
Platelets: 93 10*3/uL — ABNORMAL LOW (ref 150–400)
RBC: 3.42 MIL/uL — ABNORMAL LOW (ref 4.22–5.81)
RDW: 13.2 % (ref 11.5–15.5)

## 2011-08-01 LAB — BASIC METABOLIC PANEL
CO2: 26 mEq/L (ref 19–32)
Calcium: 9.1 mg/dL (ref 8.4–10.5)
Creatinine, Ser: 1.18 mg/dL (ref 0.50–1.35)
GFR calc non Af Amer: 53 mL/min — ABNORMAL LOW (ref >90)

## 2011-08-02 LAB — CBC
MCH: 32.9 pg (ref 26.0–34.0)
MCV: 96.3 fL (ref 78.0–100.0)
Platelets: 71 10*3/uL — ABNORMAL LOW (ref 150–400)
RDW: 13 % (ref 11.5–15.5)
WBC: 6.1 10*3/uL (ref 4.0–10.5)

## 2011-08-02 LAB — GLUCOSE, CAPILLARY: Glucose-Capillary: 168 mg/dL — ABNORMAL HIGH (ref 70–99)

## 2011-08-02 LAB — BASIC METABOLIC PANEL
Calcium: 9.1 mg/dL (ref 8.4–10.5)
Chloride: 104 mEq/L (ref 96–112)
Creatinine, Ser: 1.34 mg/dL (ref 0.50–1.35)
GFR calc Af Amer: 53 mL/min — ABNORMAL LOW (ref >90)

## 2011-08-03 LAB — BASIC METABOLIC PANEL
BUN: 17 mg/dL (ref 6–23)
CO2: 26 mEq/L (ref 19–32)
Calcium: 8.9 mg/dL (ref 8.4–10.5)
Creatinine, Ser: 1.27 mg/dL (ref 0.50–1.35)
GFR calc non Af Amer: 49 mL/min — ABNORMAL LOW (ref >90)
Glucose, Bld: 119 mg/dL — ABNORMAL HIGH (ref 70–99)
Sodium: 138 mEq/L (ref 135–145)

## 2011-08-03 LAB — CBC
HCT: 31.7 % — ABNORMAL LOW (ref 39.0–52.0)
MCV: 98.1 fL (ref 78.0–100.0)
RBC: 3.23 MIL/uL — ABNORMAL LOW (ref 4.22–5.81)
WBC: 5.7 10*3/uL (ref 4.0–10.5)

## 2011-08-03 LAB — GLUCOSE, CAPILLARY
Glucose-Capillary: 170 mg/dL — ABNORMAL HIGH (ref 70–99)
Glucose-Capillary: 132 mg/dL — ABNORMAL HIGH (ref 70–99)
Glucose-Capillary: 155 mg/dL — ABNORMAL HIGH (ref 70–99)

## 2011-08-03 LAB — SURGICAL PCR SCREEN: Staphylococcus aureus: NEGATIVE

## 2011-08-03 MED ORDER — NITROGLYCERIN 0.4 MG SL SUBL
0.4000 mg | SUBLINGUAL_TABLET | SUBLINGUAL | Status: DC | PRN
Start: 2011-08-03 — End: 2011-08-05

## 2011-08-03 MED ORDER — HYDROCODONE-ACETAMINOPHEN 5-325 MG PO TABS
1.0000 | ORAL_TABLET | Freq: Four times a day (QID) | ORAL | Status: DC | PRN
  Administered 2011-08-05: 1 via ORAL
  Filled 2011-08-03: qty 1

## 2011-08-03 MED ORDER — PANTOPRAZOLE SODIUM 40 MG PO TBEC
40.0000 mg | DELAYED_RELEASE_TABLET | Freq: Two times a day (BID) | ORAL | Status: DC
  Administered 2011-08-04 – 2011-08-05 (×3): 40 mg via ORAL
  Filled 2011-08-03 (×2): qty 1

## 2011-08-03 MED ORDER — VANCOMYCIN HCL 1000 MG IV SOLR
750.0000 mg | INTRAVENOUS | Status: DC
  Administered 2011-08-04: 750 mg via INTRAVENOUS
  Filled 2011-08-03 (×2): qty 750

## 2011-08-03 MED ORDER — DOCUSATE SODIUM 100 MG PO CAPS
200.0000 mg | ORAL_CAPSULE | Freq: Two times a day (BID) | ORAL | Status: DC
  Administered 2011-08-04 – 2011-08-05 (×3): 200 mg via ORAL
  Filled 2011-08-03 (×4): qty 2

## 2011-08-03 MED ORDER — FLUOCINONIDE 0.05 % EX CREA
TOPICAL_CREAM | Freq: Two times a day (BID) | CUTANEOUS | Status: DC
  Administered 2011-08-04 – 2011-08-05 (×3): via TOPICAL
  Filled 2011-08-03: qty 30

## 2011-08-03 MED ORDER — LOSARTAN POTASSIUM 50 MG PO TABS
50.0000 mg | ORAL_TABLET | Freq: Every day | ORAL | Status: DC
  Administered 2011-08-04 – 2011-08-05 (×2): 50 mg via ORAL
  Filled 2011-08-03 (×2): qty 1

## 2011-08-03 MED ORDER — ACETAMINOPHEN 325 MG PO TABS: 650.0000 mg | ORAL_TABLET | Freq: Three times a day (TID) | ORAL | Status: DC | PRN

## 2011-08-03 MED ORDER — INSULIN ASPART 100 UNIT/ML ~~LOC~~ SOLN
0.0000 [IU] | Freq: Three times a day (TID) | SUBCUTANEOUS | Status: DC
  Administered 2011-08-04 – 2011-08-05 (×3): 2 [IU] via SUBCUTANEOUS

## 2011-08-03 MED ORDER — GABAPENTIN 100 MG PO CAPS
100.0000 mg | ORAL_CAPSULE | Freq: Every day | ORAL | Status: DC
  Administered 2011-08-04: 100 mg via ORAL
  Filled 2011-08-03 (×2): qty 1

## 2011-08-03 MED ORDER — ROSUVASTATIN CALCIUM 10 MG PO TABS
10.0000 mg | ORAL_TABLET | Freq: Every day | ORAL | Status: DC
  Administered 2011-08-04: 10 mg via ORAL
  Filled 2011-08-03 (×2): qty 1

## 2011-08-03 MED ORDER — ONDANSETRON HCL 4 MG/2ML IJ SOLN: 4.0000 mg | Freq: Four times a day (QID) | INTRAMUSCULAR | Status: DC | PRN

## 2011-08-03 MED ORDER — SODIUM CHLORIDE 0.9 % IV SOLN: INTRAVENOUS | Status: DC

## 2011-08-03 MED ORDER — GLIMEPIRIDE 4 MG PO TABS
4.0000 mg | ORAL_TABLET | Freq: Every day | ORAL | Status: DC
  Administered 2011-08-04 – 2011-08-05 (×2): 4 mg via ORAL
  Filled 2011-08-03 (×2): qty 1

## 2011-08-03 MED ORDER — CIPROFLOXACIN IN D5W 400 MG/200ML IV SOLN
400.0000 mg | Freq: Two times a day (BID) | INTRAVENOUS | Status: DC
  Administered 2011-08-04 – 2011-08-05 (×3): 400 mg via INTRAVENOUS
  Filled 2011-08-03 (×6): qty 200

## 2011-08-03 MED ORDER — LORATADINE 10 MG PO TABS
10.0000 mg | ORAL_TABLET | Freq: Every day | ORAL | Status: DC
  Administered 2011-08-04 – 2011-08-05 (×2): 10 mg via ORAL
  Filled 2011-08-03 (×2): qty 1

## 2011-08-03 MED ORDER — PANCRELIPASE (LIP-PROT-AMYL) 12000-38000 UNITS PO CPEP
2.0000 | ORAL_CAPSULE | Freq: Three times a day (TID) | ORAL | Status: DC
  Administered 2011-08-04: 2 via ORAL
  Administered 2011-08-04: 10:00:00 via ORAL
  Administered 2011-08-04 – 2011-08-05 (×2): 2 via ORAL
  Filled 2011-08-03 (×6): qty 2

## 2011-08-03 MED ORDER — MECLIZINE HCL 25 MG PO TABS
25.0000 mg | ORAL_TABLET | Freq: Three times a day (TID) | ORAL | Status: DC | PRN
  Filled 2011-08-03: qty 1

## 2011-08-03 MED ORDER — SERTRALINE HCL 100 MG PO TABS
200.0000 mg | ORAL_TABLET | Freq: Every day | ORAL | Status: DC
  Administered 2011-08-04 – 2011-08-05 (×2): 200 mg via ORAL
  Filled 2011-08-03 (×2): qty 2

## 2011-08-03 MED ORDER — MORPHINE SULFATE 2 MG/ML IJ SOLN
2.0000 mg | INTRAMUSCULAR | Status: DC | PRN
  Administered 2011-08-04: 2 mg via INTRAVENOUS

## 2011-08-03 MED ORDER — CYCLOSPORINE 0.05 % OP EMUL
1.0000 [drp] | Freq: Two times a day (BID) | OPHTHALMIC | Status: DC
  Administered 2011-08-04 – 2011-08-05 (×3): 1 [drp] via OPHTHALMIC
  Filled 2011-08-03 (×5): qty 1

## 2011-08-03 MED ORDER — CALCIUM CARBONATE-VITAMIN D 500-200 MG-UNIT PO TABS
1.0000 | ORAL_TABLET | Freq: Two times a day (BID) | ORAL | Status: DC
  Administered 2011-08-04 (×2): via ORAL
  Administered 2011-08-05: 1 via ORAL
  Filled 2011-08-03 (×4): qty 1

## 2011-08-04 LAB — GLUCOSE, CAPILLARY
Glucose-Capillary: 216 mg/dL — ABNORMAL HIGH (ref 70–99)
Glucose-Capillary: 192 mg/dL — ABNORMAL HIGH (ref 70–99)
Glucose-Capillary: 158 mg/dL — ABNORMAL HIGH (ref 70–99)

## 2011-08-04 LAB — TYPE AND SCREEN
ABO/RH(D): O POS
Antibody Screen: NEGATIVE

## 2011-08-04 MED ORDER — MORPHINE SULFATE 2 MG/ML IJ SOLN
INTRAMUSCULAR | Status: AC
  Filled 2011-08-04: qty 1

## 2011-08-04 MED ORDER — MORPHINE SULFATE 2 MG/ML IJ SOLN
INTRAMUSCULAR | Status: AC
  Administered 2011-08-04: 2 mg via INTRAVENOUS
  Filled 2011-08-04: qty 1

## 2011-08-04 NOTE — Op Note (Signed)
NAMETUFF, CLABO NO.:  [id number]  MEDICAL RECORD NO.:  [id number]  LOCATION:  1445                         FACILITY:  [facility]  PHYSICIAN:  Martina Sinner, MD DATE OF BIRTH:  01-25-1920  DATE OF PROCEDURE:  08/03/2011 DATE OF DISCHARGE:                              OPERATIVE REPORT   ASSISTANT:  Jerilee Field, MD  SURGEON:  Martina Sinner, MD.  PREOPERATIVE DIAGNOSIS:  Infected artificial sphincter.  POSTOPERATIVE DIAGNOSIS:  Infected artificial sphincter.  SURGERY:  Removal of artificial sphincter plus cystoscopy.  Approximately 1 month after a re-do artificial sphincter, Mr. Sentell had a perineal incision.  It was obvious in the hospital that the sphincter was inspected, so you can send to the above procedure.  Preoperative laboratory tests were normal.  Preoperative antibiotics were given.  He had Gram-positive cocci with cultures and sensitivity pending.  Extra care was taken in with the leg positioning to minimize the risks of compartment syndrome, neuropathy, and DVT.  He had minimal swelling in the perineum with mild redness of the skin.  I opened his perineal incision and did some cautery dissection with lot of finger dissection until I could feel the artificial sphincter cuff easily.  14-French catheter was then placed.  Prior to this maneuver, I cystoscoped the patient and clinically he did not have any erosion by cystoscopy.  I opened the pseudocapsule carefully without moving his sphincter to injure the urethra.  I cut the tubing appropriately and removed the cuff easily.  The patient was recystoscoped and the urethra was normal with mild hyperemia in the ureter cuff with no evulsion.  Copious irrigation was used in the perineum with saline and even the Pulsavac was utilized first for a few minutes.  I opened the left lower quadrant incision recognizing that clinically it was not infected, but I wanted to remove all the  prosthesis.  I found the tubing easily.  Minimal cautery and blunt dissection was utilized. I did almost finger dissect and removed the component easily.  Dr. Mena Goes and I with appropriate retraction and tension dissected down to near the fascia where his abdominal mesh was.  I cut the 2 tubing emptying the reservoir.  The reservoir was easily removed with minimal cautery dissection.  Irrigation was utilized in the abdominal incision. I closed the abdominal incision with 3-0 Vicryl for subcutaneous tissue and 4-0 subcuticular for the skin.  I closed the perineum very loosely with 2 layers of broadly spaced interrupted 3-0 Vicryl and I put in a small Penrose drain.  Appropriate dressings were applied.  Leg positioning was good.  Blood loss was minimal.  Hopefully, this will reach the patient's treatment goal.          ______________________________ Martina Sinner, MD     SAM/MEDQ  D:  08/03/2011  T:  08/04/2011  Job:  [id number]

## 2011-08-04 NOTE — Consult Note (Signed)
ANTIBIOTIC CONSULT NOTE - FOLLOW UP  Pharmacy Consult for Vancomycin Indication: Wound infection  Allergies  Allergen Reactions  . Albuterol Sulfate Hfa (RUE:[id number]) Shortness Of Breath  . Adhesive (Tape) Other (See Comments)    REACTION: SKIN BLISTERS  . Penicillins Other (See Comments)    REACTION: ITCHING HANDS  . Sulfa Drugs Cross Reactors Hives    Patient Measurements: Height: 5\' 6"  (167.6 cm) (entered during cutover) Weight: 162 lb 8.7 oz (73.73 kg) (entered during cutover) IBW/kg (Calculated) : 63.8   Vital Signs: Temp: 98.4 F (36.9 C) (11/04 0700) Temp src: Oral (11/04 0700) BP: 146/66 mmHg (11/04 0700) Pulse Rate: 74  (11/04 0700) Intake/Output from previous day: 11/03 0701 - 11/04 0700 In: 1275 [P.O.:240; I.V.:835; IV Piggyback:200] Out: 700 [Urine:700] Intake/Output from this shift:    Labs:  Basename 08/03/11 0550 08/02/11 0540 08/01/11 1708  WBC 5.7 6.1 6.3  HGB 10.4* 10.6* 11.3*  PLT 89* 71* 93*  LABCREA -- -- --  CREATININE 1.27 1.34 1.18   Estimated Creatinine Clearance: 36.3 ml/min (by C-G formula based on Cr of 1.27).  Microbiology: Recent Results (from the past 720 hour(s))  WOUND CULTURE     Status: Normal   Collection Time   08/02/11  8:19 AM      Component Value Range Status Comment   Specimen Description WOUND PERINEUM   Final    Special Requests IMMUNE:NORM   Final    Gram Stain     Final    Value: ABUNDANT WBC PRESENT, PREDOMINANTLY PMN     FEW SQUAMOUS EPITHELIAL CELLS PRESENT     MODERATE GRAM POSITIVE COCCI IN PAIRS AND CHAINS   Culture     Final    Value: MULTIPLE ORGANISMS PRESENT, NONE PREDOMINANT     Note: NO STAPHYLOCOCCUS AUREUS ISOLATED NO GROUP A STREP (S.PYOGENES) ISOLATED   Report Status 08/04/2011 FINAL   Final   SURGICAL PCR SCREEN     Status: Normal   Collection Time   08/03/11  5:37 AM      Component Value Range Status Comment   MRSA, PCR NEGATIVE  NEGATIVE  Final    Staphylococcus aureus NEGATIVE  NEGATIVE   Final     Assessment: 69 YOM s/p replacement of artificial urinary sphincter + cystoscopy early Oct, 12 -  presented with wound infection and was started on Vancomycin + Cipro empirically.  Pt underwent replacement of artificial urinary sphincter + cystoscopy on 11/3. Wound culture negative for predominant organism.  Scr changing rapidly.   Goal of Therapy:  Vancomycin trough level 10-15 mcg/ml  Plan:  Continue vancomycin at 750 mg iv q24 hours for now.  Will f/u MD's plans for antibiotics and obtain vancomycin trough tomorrow if vancomycin is to be continued.  Geoffry Paradise Thi 08/04/2011,11:40 AM

## 2011-08-04 NOTE — Progress Notes (Signed)
  Subjective: Patient reports mild inguinal pain. No CP or SOB. +Flatus.   Objective: Vital signs in last 24 hours: Temp:  [98.4 F (36.9 C)-98.9 F (37.2 C)] 98.4 F (36.9 C) (11/04 0700) Pulse Rate:  [74-79] 74  (11/04 0700) Resp:  [18-20] 18  (11/04 0700) BP: (123-146)/(61-74) 146/66 mmHg (11/04 0700) SpO2:  [97 %-98 %] 97 % (11/04 0700)  Intake/Output from previous day: 11/03 0701 - 11/04 0700 In: 1275 [P.O.:240; I.V.:835; IV Piggyback:200] Out: 700 [Urine:700] Intake/Output this shift:    Physical Exam:  General:Well appearing, NAD.  GI: abd non-tender, soft. Left inguinal dressing. Perineum - Penrose backed out half way. Wound clean, serosanginous drainage  Foley - urine clear.   Lab Results:  Basename 08/03/11 0550 08/02/11 0540 08/01/11 1708  HGB 10.4* 10.6* 11.3*  HCT 31.7* 31.0* 33.0*   BMET  Basename 08/03/11 0550 08/02/11 0540  NA 138 137  K 3.9 3.4*  CL 105 104  CO2 26 25  GLUCOSE 119* 118*  BUN 17 17  CREATININE 1.27 1.34  CALCIUM 8.9 9.1   No results found for this basename: LABPT:3,INR:3 in the last 72 hours No results found for this basename: LABURIN:1 in the last 72 hours Results for orders placed during the hospital encounter of 08/01/11  WOUND CULTURE     Status: Normal   Collection Time   08/02/11  8:19 AM      Component Value Range Status Comment   Specimen Description WOUND PERINEUM   Final    Special Requests IMMUNE:NORM   Final    Gram Stain     Final    Value: ABUNDANT WBC PRESENT, PREDOMINANTLY PMN     FEW SQUAMOUS EPITHELIAL CELLS PRESENT     MODERATE GRAM POSITIVE COCCI IN PAIRS AND CHAINS   Culture     Final    Value: MULTIPLE ORGANISMS PRESENT, NONE PREDOMINANT     Note: NO STAPHYLOCOCCUS AUREUS ISOLATED NO GROUP A STREP (S.PYOGENES) ISOLATED   Report Status 08/04/2011 FINAL   Final   SURGICAL PCR SCREEN     Status: Normal   Collection Time   08/03/11  5:37 AM      Component Value Range Status Comment   MRSA, PCR  NEGATIVE  NEGATIVE  Final    Staphylococcus aureus NEGATIVE  NEGATIVE  Final     Studies/Results: No results found.  Assessment/Plan:  Infected AUS s/p explant. Stable post-op. Continue supportive care. Transition to po abx based on cx.   LOS: 3 days   Antony Haste 08/04/2011, 4:57 PM

## 2011-08-05 MED ORDER — HYDROCODONE-ACETAMINOPHEN 5-325 MG PO TABS: 1.0000 | ORAL_TABLET | Freq: Four times a day (QID) | ORAL | Status: AC | PRN

## 2011-08-05 MED ORDER — CEPHALEXIN 250 MG PO CAPS: 250.0000 mg | ORAL_CAPSULE | Freq: Four times a day (QID) | ORAL | Status: AC

## 2011-08-05 NOTE — Progress Notes (Signed)
Sept 5th, 2012 Vitals normal Laboratory tests normal Patient alert and stable Pain minimal and well-controlled Incision healing well Dc'ed drain penrose Home nursing set up Post treatment course discussed in detail Followup discussed in detail See orders

## 2011-08-05 NOTE — Plan of Care (Signed)
Problem: Phase I Progression Outcomes Goal: Initial discharge plan identified Outcome: Adequate for Discharge pa  Problem: Phase II Progression Outcomes Goal: Other Phase II Outcomes/Goals a  Problem: Phase III Progression Outcomes Goal: Voiding independently Outcome: Progressing Patient d/c home with [facility] Goal: IV changed to normal saline lock Outcome: Adequate for Discharge IV d/c'd  Problem: Discharge Progression Outcomes Goal: Tubes and drains discontinued if indicated Outcome: Not Applicable Date Met:  08/05/11 Patient d/c home with [facility]

## 2011-08-08 NOTE — Discharge Summary (Signed)
Physician Discharge Summary   Patient ID: Guy [name] [id number] 77 y.o. [birth year]  Admit date: 08/01/2011  Discharge date and time: 08/05/2011 12:58 PM   Admitting Physician: Martina Sinner, MD   Discharge Physician: Brylei Pedley  Admission Diagnoses: wound infection  Discharge Diagnoses: wound infection  Admission Condition: good  Discharged Condition: good  Indication for Admission: wound infection  Hospital Course:admitted for explantation of sphincter; normal post-op course; wound looked great; labs normal; do's and don't discussed   Consults: none   Treatments: antibiotics:   Disposition: home health nurse  Patient Instructions:  Discharge Medication List as of 08/05/2011 12:33 PM    START taking these medications   Details  cephALEXin (KEFLEX) 250 MG capsule Take 1 capsule (250 mg total) by mouth 4 (four) times daily., Starting 08/05/2011, Until Mon 08/12/11, Print    HYDROcodone-acetaminophen (NORCO) 5-325 MG per tablet Take 1-2 tablets by mouth every 6 (six) hours as needed., Starting 08/05/2011, Until Thu 08/15/11, Print      CONTINUE these medications which have NOT CHANGED   Details  acetaminophen (TYLENOL) 325 MG tablet Take 650 mg by mouth every 8 (eight) hours as needed. FOR PAIN , Until Discontinued, Historical Med    aspirin EC 81 MG tablet Take 81 mg by mouth daily.  , Until Discontinued, Historical Med    Calcium Carbonate-Vitamin D (CALCIUM 600+D HIGH POTENCY) 600-400 MG-UNIT per tablet Take 1 tablet by mouth 2 (two) times daily.  , Until Discontinued, Historical Med    cycloSPORINE (RESTASIS) 0.05 % ophthalmic emulsion Place 1 drop into both eyes 2 (two) times daily.  , Until Discontinued, Historical Med    docusate sodium (COLACE) 100 MG capsule Take 100 mg by mouth 2 (two) times daily.  , Until Discontinued, Historical Med    fluocinonide (LIDEX) 0.05 % cream Apply 1 application topically 2 (two) times daily.  , Until Discontinued,  Historical Med    gabapentin (NEURONTIN) 100 MG capsule Take 100 mg by mouth at bedtime.  , Until Discontinued, Historical Med    glimepiride (AMARYL) 4 MG tablet Take 4 mg by mouth daily before breakfast.  , Until Discontinued, Historical Med    insulin glargine (LANTUS) 100 UNIT/ML injection Inject 12 Units into the skin daily.  , Until Discontinued, Historical Med    loratadine (CLARITIN) 10 MG tablet Take 10 mg by mouth daily.  , Until Discontinued, Historical Med    losartan (COZAAR) 50 MG tablet Take 50 mg by mouth every morning.  , Until Discontinued, Historical Med    meclizine (ANTIVERT) 25 MG tablet Take 25 mg by mouth 3 (three) times daily as needed. FOR DIZZINESS , Until Discontinued, Historical Med    metoprolol (LOPRESSOR) 50 MG tablet Take 25 mg by mouth 2 (two) times daily.  , Until Discontinued, Historical Med    Multiple Vitamins-Minerals (MULTIVITAMINS THER. W/MINERALS) TABS Take 1 tablet by mouth daily.  , Until Discontinued, Historical Med    omeprazole (PRILOSEC) 20 MG capsule Take 20 mg by mouth 2 (two) times daily.  , Until Discontinued, Historical Med    sertraline (ZOLOFT) 100 MG tablet Take 200 mg by mouth daily.  , Until Discontinued, Historical Med    simvastatin (ZOCOR) 80 MG tablet Take 40 mg by mouth daily.  , Until Discontinued, Historical Med    nitroGLYCERIN (NITROSTAT) 0.4 MG SL tablet Place 0.4 mg under the tongue every 5 (five) minutes as needed. FOR CHEST PAIN , Until Discontinued, Historical Med  Activity: activity as tolerated Diet: regular diet Wound Care: as directed  F/up: with me in 3 days  Signed: Tywanda Rice A 08/08/2011 8:16 AM

## 2011-09-26 ENCOUNTER — Other Ambulatory Visit (HOSPITAL_COMMUNITY): Payer: Self-pay | Admitting: Urology

## 2011-09-26 DIAGNOSIS — R102 Pelvic and perineal pain: Secondary | ICD-10-CM

## 2011-10-04 ENCOUNTER — Other Ambulatory Visit (HOSPITAL_COMMUNITY): Payer: Self-pay | Admitting: Urology

## 2011-10-04 ENCOUNTER — Ambulatory Visit (HOSPITAL_COMMUNITY)
Admission: RE | Admit: 2011-10-04 | Discharge: 2011-10-04 | Disposition: A | Discharge status: Home / Self Care | Payer: Medicare Other | Source: Ambulatory Visit | Attending: Urology | Admitting: Urology

## 2011-10-04 DIAGNOSIS — M949 Disorder of cartilage, unspecified: Secondary | ICD-10-CM | POA: Insufficient documentation

## 2011-10-04 DIAGNOSIS — R609 Edema, unspecified: Secondary | ICD-10-CM | POA: Insufficient documentation

## 2011-10-04 DIAGNOSIS — R188 Other ascites: Secondary | ICD-10-CM | POA: Insufficient documentation

## 2011-10-04 DIAGNOSIS — N433 Hydrocele, unspecified: Secondary | ICD-10-CM | POA: Insufficient documentation

## 2011-10-04 DIAGNOSIS — R109 Unspecified abdominal pain: Secondary | ICD-10-CM | POA: Insufficient documentation

## 2011-10-04 DIAGNOSIS — R102 Pelvic and perineal pain: Secondary | ICD-10-CM

## 2011-10-04 DIAGNOSIS — Z9079 Acquired absence of other genital organ(s): Secondary | ICD-10-CM | POA: Insufficient documentation

## 2011-10-04 DIAGNOSIS — Z9689 Presence of other specified functional implants: Secondary | ICD-10-CM | POA: Insufficient documentation

## 2011-10-04 DIAGNOSIS — M899 Disorder of bone, unspecified: Secondary | ICD-10-CM | POA: Insufficient documentation

## 2011-10-04 LAB — CREATININE, SERUM
Creatinine, Ser: 1.35 mg/dL (ref 0.50–1.35)
GFR calc Af Amer: 52 mL/min — ABNORMAL LOW (ref >90)
GFR calc non Af Amer: 45 mL/min — ABNORMAL LOW (ref >90)

## 2011-10-04 MED ORDER — GADOBENATE DIMEGLUMINE 529 MG/ML IV SOLN
15.0000 mL | Freq: Once | INTRAVENOUS | Status: AC | PRN
  Administered 2011-10-04: 15 mL via INTRAVENOUS

## 2012-05-27 ENCOUNTER — Ambulatory Visit: Payer: Medicare Other | Attending: Endocrinology | Admitting: Rehabilitation

## 2012-05-27 DIAGNOSIS — IMO0001 Reserved for inherently not codable concepts without codable children: Secondary | ICD-10-CM | POA: Insufficient documentation

## 2012-05-27 DIAGNOSIS — R5381 Other malaise: Secondary | ICD-10-CM | POA: Insufficient documentation

## 2012-05-27 DIAGNOSIS — M6281 Muscle weakness (generalized): Secondary | ICD-10-CM | POA: Insufficient documentation

## 2012-06-08 ENCOUNTER — Ambulatory Visit: Payer: Medicare Other | Attending: Endocrinology | Admitting: Physical Therapy

## 2012-06-08 DIAGNOSIS — IMO0001 Reserved for inherently not codable concepts without codable children: Secondary | ICD-10-CM | POA: Insufficient documentation

## 2012-06-08 DIAGNOSIS — R5381 Other malaise: Secondary | ICD-10-CM | POA: Insufficient documentation

## 2012-06-08 DIAGNOSIS — M6281 Muscle weakness (generalized): Secondary | ICD-10-CM | POA: Insufficient documentation

## 2012-06-12 ENCOUNTER — Ambulatory Visit: Payer: Medicare Other | Admitting: Physical Therapy

## 2012-06-15 ENCOUNTER — Ambulatory Visit: Payer: Medicare Other | Admitting: Physical Therapy

## 2012-06-17 ENCOUNTER — Ambulatory Visit: Payer: Medicare Other | Admitting: Physical Therapy

## 2012-06-22 ENCOUNTER — Ambulatory Visit: Payer: Medicare Other | Admitting: Physical Therapy

## 2012-06-23 ENCOUNTER — Ambulatory Visit: Payer: Medicare Other | Admitting: Physical Therapy

## 2012-06-30 ENCOUNTER — Ambulatory Visit: Payer: Medicare Other | Attending: Endocrinology | Admitting: Physical Therapy

## 2012-06-30 ENCOUNTER — Ambulatory Visit: Payer: Medicare Other | Admitting: Physical Therapy

## 2012-06-30 DIAGNOSIS — IMO0001 Reserved for inherently not codable concepts without codable children: Secondary | ICD-10-CM | POA: Insufficient documentation

## 2012-06-30 DIAGNOSIS — M6281 Muscle weakness (generalized): Secondary | ICD-10-CM | POA: Insufficient documentation

## 2012-06-30 DIAGNOSIS — R5381 Other malaise: Secondary | ICD-10-CM | POA: Insufficient documentation

## 2012-07-06 ENCOUNTER — Ambulatory Visit: Payer: Medicare Other | Admitting: Physical Therapy

## 2012-11-11 ENCOUNTER — Emergency Department (HOSPITAL_COMMUNITY)
Admission: EM | Admit: 2012-11-11 | Discharge: 2012-11-11 | Disposition: A | Discharge status: Home / Self Care | Payer: Medicare Other | Attending: Emergency Medicine | Admitting: Emergency Medicine

## 2012-11-11 ENCOUNTER — Encounter (HOSPITAL_COMMUNITY): Payer: Self-pay

## 2012-11-11 DIAGNOSIS — I10 Essential (primary) hypertension: Secondary | ICD-10-CM | POA: Insufficient documentation

## 2012-11-11 DIAGNOSIS — K219 Gastro-esophageal reflux disease without esophagitis: Secondary | ICD-10-CM | POA: Insufficient documentation

## 2012-11-11 DIAGNOSIS — Z9861 Coronary angioplasty status: Secondary | ICD-10-CM | POA: Insufficient documentation

## 2012-11-11 DIAGNOSIS — R1032 Left lower quadrant pain: Secondary | ICD-10-CM | POA: Insufficient documentation

## 2012-11-11 DIAGNOSIS — M545 Low back pain, unspecified: Secondary | ICD-10-CM | POA: Insufficient documentation

## 2012-11-11 DIAGNOSIS — M5430 Sciatica, unspecified side: Secondary | ICD-10-CM | POA: Insufficient documentation

## 2012-11-11 DIAGNOSIS — M5440 Lumbago with sciatica, unspecified side: Secondary | ICD-10-CM

## 2012-11-11 DIAGNOSIS — R52 Pain, unspecified: Secondary | ICD-10-CM | POA: Insufficient documentation

## 2012-11-11 DIAGNOSIS — Z794 Long term (current) use of insulin: Secondary | ICD-10-CM | POA: Insufficient documentation

## 2012-11-11 DIAGNOSIS — Z7982 Long term (current) use of aspirin: Secondary | ICD-10-CM | POA: Insufficient documentation

## 2012-11-11 DIAGNOSIS — R630 Anorexia: Secondary | ICD-10-CM | POA: Insufficient documentation

## 2012-11-11 DIAGNOSIS — Z8739 Personal history of other diseases of the musculoskeletal system and connective tissue: Secondary | ICD-10-CM | POA: Insufficient documentation

## 2012-11-11 DIAGNOSIS — K5900 Constipation, unspecified: Secondary | ICD-10-CM | POA: Insufficient documentation

## 2012-11-11 DIAGNOSIS — Z79899 Other long term (current) drug therapy: Secondary | ICD-10-CM | POA: Insufficient documentation

## 2012-11-11 DIAGNOSIS — R269 Unspecified abnormalities of gait and mobility: Secondary | ICD-10-CM | POA: Insufficient documentation

## 2012-11-11 DIAGNOSIS — Z8546 Personal history of malignant neoplasm of prostate: Secondary | ICD-10-CM | POA: Insufficient documentation

## 2012-11-11 DIAGNOSIS — E119 Type 2 diabetes mellitus without complications: Secondary | ICD-10-CM | POA: Insufficient documentation

## 2012-11-11 HISTORY — DX: Sciatica, unspecified side: M54.30

## 2012-11-11 HISTORY — DX: Gastro-esophageal reflux disease without esophagitis: K21.9

## 2012-11-11 HISTORY — DX: Type 2 diabetes mellitus without complications: E11.9

## 2012-11-11 HISTORY — DX: Unspecified osteoarthritis, unspecified site: M19.90

## 2012-11-11 HISTORY — DX: Essential (primary) hypertension: I10

## 2012-11-11 MED ORDER — POLYETHYLENE GLYCOL 3350 17 G PO PACK: 17.0000 g | PACK | Freq: Every day | ORAL | Status: DC

## 2012-11-11 MED ORDER — OXYCODONE-ACETAMINOPHEN 5-325 MG PO TABS
2.0000 | ORAL_TABLET | Freq: Once | ORAL | Status: AC
  Administered 2012-11-11: 2 via ORAL
  Filled 2012-11-11: qty 2

## 2012-11-11 MED ORDER — OXYCODONE-ACETAMINOPHEN 10-325 MG PO TABS: 1.0000 | ORAL_TABLET | ORAL | Status: DC | PRN

## 2012-11-11 NOTE — ED Notes (Signed)
Voiced understanding of instructions given 

## 2012-11-11 NOTE — ED Provider Notes (Signed)
History     CSN: [id number]  Arrival date & time 11/11/12  4098   First MD Initiated Contact with Patient 11/11/12 [phone]      Chief Complaint  Patient presents with  . Back Pain    HPI Pt is a 79 yo M with PMH of prostate cancer, HTN, DM, scoliosis who was brought in via EMS for back pain. Pt was evaluated at [facility] ED 2 days ago for the same pain. He had a full work up including MRI and labs which was negative, and he was diagnosed with sciatica treated with Tramadol and Gabapentin. Pt's wife was concerned today because his pain was so severe he was unable to get out of bed. Once he stood up, he was able to bear weight and walk with his walker, but any position change makes his pain worse. Pt states his pain is unchanged since Sunday. He denies numbness or tingling, only shooting pains from his lower back down both legs. He has never had this before. He denies and injury to back or legs. No new movements or exercises.  Past Medical History  Diagnosis Date  . Hypertension   . Diabetes mellitus without complication   . Arthritis   . Sciatica   . GERD (gastroesophageal reflux disease)     Past Surgical History  Procedure Laterality Date  . Coronary angioplasty with stent placement      No family history on file.  History  Substance Use Topics  . Smoking status: Not on file  . Smokeless tobacco: Not on file  . Alcohol Use: No      Review of Systems  Constitutional: Positive for appetite change. Negative for fever.  HENT: Negative for neck stiffness.   Respiratory: Negative for shortness of breath.   Cardiovascular: Negative for chest pain.  Gastrointestinal: Positive for abdominal pain (Chronic s/p multiple urology procedures) and constipation (last BM 7 days ago).  Musculoskeletal: Positive for back pain and gait problem.  Skin: Negative for rash.  Neurological: Negative for headaches.    Allergies  Albuterol sulfate hfa; Adhesive; Penicillins; and Sulfa drugs  cross reactors  Home Medications   Current Outpatient Rx  Name  Route  Sig  Dispense  Refill  . acetaminophen (TYLENOL) 325 MG tablet   Oral   Take 325 mg by mouth every 6 (six) hours as needed for pain.          [name] aspirin EC 81 MG tablet   Oral   Take 81 mg by mouth every evening.          . bicalutamide (CASODEX) 50 MG tablet   Oral   Take 50 mg by mouth daily.         . Calcium Carbonate-Vitamin D (CALCIUM 600+D HIGH POTENCY) 600-400 MG-UNIT per tablet   Oral   Take 1 tablet by mouth 2 (two) times daily.           . cycloSPORINE (RESTASIS) 0.05 % ophthalmic emulsion   Both Eyes   Place 1 drop into both eyes 2 (two) times daily.           [name] docusate sodium (COLACE) 100 MG capsule   Oral   Take 20 mg by mouth 2 (two) times daily.          . fluocinonide (LIDEX) 0.05 % cream   Topical   Apply 1 application topically 2 (two) times daily.           [name] gabapentin (NEURONTIN)  100 MG capsule   Oral   Take 100 mg by mouth at bedtime.           [name] glimepiride (AMARYL) 4 MG tablet   Oral   Take 4 mg by mouth daily before breakfast.           . insulin glargine (LANTUS) 100 UNIT/ML injection   Subcutaneous   Inject 12 Units into the skin at bedtime.          [name] Leuprolide Acetate (LUPRON IJ)   Injection   Inject 1 each as directed as directed. He receives every 6 [facility] at [facility].         . loratadine (CLARITIN) 10 MG tablet   Oral   Take 10 mg by mouth daily as needed for allergies.          [name] losartan (COZAAR) 50 MG tablet   Oral   Take 50 mg by mouth every morning.           . meclizine (ANTIVERT) 25 MG tablet   Oral   Take 25 mg by mouth 3 (three) times daily as needed for dizziness.          . metoprolol (LOPRESSOR) 50 MG tablet   Oral   Take 25 mg by mouth 2 (two) times daily.           . Multiple Vitamins-Minerals (MULTIVITAMINS THER. W/MINERALS) TABS   Oral   Take 1 tablet by mouth every morning.           . nitroGLYCERIN (NITROSTAT) 0.4 MG SL tablet   Sublingual   Place 0.4 mg under the tongue every 5 (five) minutes as needed for chest pain.          [name] omeprazole (PRILOSEC) 20 MG capsule   Oral   Take 20 mg by mouth 2 (two) times daily.           [name] oxybutynin (DITROPAN) 5 MG tablet   Oral   Take 5 mg by mouth at bedtime.         . sertraline (ZOLOFT) 100 MG tablet   Oral   Take 200 mg by mouth at bedtime.          . simvastatin (ZOCOR) 80 MG tablet   Oral   Take 40 mg by mouth at bedtime.          . traMADol (ULTRAM) 50 MG tablet   Oral   Take 50 mg by mouth every 8 (eight) hours as needed for pain.          [name] oxyCODONE-acetaminophen (PERCOCET) 10-325 MG per tablet   Oral   Take 1 tablet by mouth every 4 (four) hours as needed for pain.   30 tablet   0   . polyethylene glycol (MIRALAX / GLYCOLAX) packet   Oral   Take 17 g by mouth daily.   14 each   0     BP 181/83  Pulse 68  Temp(Src) 98.6 F (37 C) (Oral)  Resp 17  SpO2 95%  Physical Exam  Constitutional: He is oriented to person, place, and time. He appears well-developed.  Elderly male, acutely distressed with movement otherwise comfortable  HENT:  Head: Normocephalic and atraumatic.  Mouth/Throat: Oropharynx is clear and moist. Mucous membranes are dry. Abnormal dentition (Missing mulitple teeth).  Neck: Normal range of motion. Neck supple.  Cardiovascular: Normal rate, regular rhythm and normal heart sounds.   Pulmonary/Chest: Effort normal and  breath sounds normal. He has no wheezes.  Abdominal: Soft. There is tenderness (TTP LLQ with firmness over old surgical sites). There is guarding.  Musculoskeletal:  Pain with any movement, including bumping bed. Pain in back with straight leg raise bilaterally, but improved with bending knee. Lower extremities neurovascularly intact. No sensory loss.   Lymphadenopathy:    He has no cervical adenopathy.  Neurological: He is alert and oriented  to person, place, and time. No cranial nerve deficit.  Skin: Skin is warm and dry. No rash noted.    ED Course  Procedures (including critical care time)  Labs Reviewed - No data to display No results found.   1. Low back pain with sciatica     MDM  79 yo M with acute back pain. Given negative work up, most likely sciatica nerve pain.  DDx include spinal stenosis, pathologic fracture or bony mets from prostate cancer, but scans negative. Old records reviewed. No metastatic lesions noted on MRI or definitive cause of pain. Since he had a full workup at [facility], there is no indication for further imaging or work up.  Will treat pain with Percocet in the ED. Since this pain is getting worse at home, will consult case management to discuss help resources at home. Home health orders placed for home health PT, OT, RN, NA and SW.  Percocet helped pain. Will d/c with Rx for percocet. Advised of increased fall risk and he should be careful ambulating while on medication. Also given Rx for Miralax to help with constipation especially while on narcotics.   Will call EMS to transport home after home health services arranged. D/c home in stable condition.    Hilarie Fredrickson, MD 11/11/12 1128

## 2012-11-11 NOTE — Progress Notes (Signed)
Pt clinicals fax to [facility] with confirmation fax received

## 2012-11-11 NOTE — ED Notes (Signed)
Gave phone for pt to talk with spouse

## 2012-11-11 NOTE — ED Notes (Signed)
Bed:WHALA<BR> Expected date:<BR> Expected time:<BR> Means of arrival:<BR> Comments:<BR> Ems/ leg pain

## 2012-11-11 NOTE — Progress Notes (Signed)
[facility] ED CM spoke with pt and left him with [place] [facility] lists Pt choice [facility] CM left voice message for susan of [facility] at [phone] to provide the referral Pt requesting to call his wife Cm spoke with Rn, Traci to assist with call to his wife   Choices offered  HOME HEALTH AGENCIES SERVING [place]   Agencies that are Medicare-Certified and are affiliated with [facility] Agency  Telephone Number Address  [facility]   The [facility] has ownership interest in this company; however, you are under no obligation to use this agency. [phone] or  [phone] [address] [place], [place] [address] [email or web address]   Agencies that are Medicare-Certified and are not affiliated with [facility] Telephone Number Address  [facility] [phone] Fax [phone] [address], [address] [place], [place]  [address] [email or web address]  [facility] [phone] or [phone] Fax [phone] [address] [place], [place] [address] [email or web address]  [facility] [phone] Fax [phone] [address] [place], [place] [address] [email or web address]  [facility] [phone] Fax [phone] [address] [place], [place]  [address] [email or web address]  [facility] [phone] Fax [phone] [address] [place], [place] [address] [email or web address]  [facility] [phone] [address] [place], [place] [address] [email or web address].dk  [facility] [phone]  [address] [place], [place]  [address] [email or web address]  [facility] [phone] or [phone] Fax number [phone] [address], [address] [place], [place]  [address] [email or web address]  [facility] [phone] Fax [phone] [address] [place], [place]  [address]  [facility]  [phone] Fax [phone] [address] [place], [place] [address] [email or web address]

## 2012-11-11 NOTE — ED Notes (Signed)
Per EMS, Pt, from home, c/o lower back pain radiating into legs.  Pain score 10/10, with movement.  Was seen at [facility], on Sunday, for same complaint.  Vitals are stable.

## 2012-11-11 NOTE — Progress Notes (Signed)
ED CM noted Cm consult Orders to be written by EDP

## 2012-11-13 ENCOUNTER — Emergency Department (HOSPITAL_COMMUNITY)
Admission: EM | Admit: 2012-11-13 | Discharge: 2012-11-13 | Disposition: A | Discharge status: Home / Self Care | Payer: Medicare Other | Source: Home / Self Care | Attending: Emergency Medicine | Admitting: Emergency Medicine

## 2012-11-13 ENCOUNTER — Encounter (HOSPITAL_COMMUNITY): Payer: Self-pay | Admitting: Emergency Medicine

## 2012-11-13 DIAGNOSIS — M5430 Sciatica, unspecified side: Secondary | ICD-10-CM

## 2012-11-13 DIAGNOSIS — Z85118 Personal history of other malignant neoplasm of bronchus and lung: Secondary | ICD-10-CM | POA: Insufficient documentation

## 2012-11-13 DIAGNOSIS — Z9861 Coronary angioplasty status: Secondary | ICD-10-CM | POA: Insufficient documentation

## 2012-11-13 DIAGNOSIS — Z8669 Personal history of other diseases of the nervous system and sense organs: Secondary | ICD-10-CM | POA: Insufficient documentation

## 2012-11-13 DIAGNOSIS — Z87891 Personal history of nicotine dependence: Secondary | ICD-10-CM | POA: Insufficient documentation

## 2012-11-13 DIAGNOSIS — Z8546 Personal history of malignant neoplasm of prostate: Secondary | ICD-10-CM | POA: Insufficient documentation

## 2012-11-13 DIAGNOSIS — E785 Hyperlipidemia, unspecified: Secondary | ICD-10-CM | POA: Insufficient documentation

## 2012-11-13 DIAGNOSIS — M810 Age-related osteoporosis without current pathological fracture: Secondary | ICD-10-CM | POA: Insufficient documentation

## 2012-11-13 DIAGNOSIS — Z7982 Long term (current) use of aspirin: Secondary | ICD-10-CM | POA: Insufficient documentation

## 2012-11-13 DIAGNOSIS — F3289 Other specified depressive episodes: Secondary | ICD-10-CM | POA: Insufficient documentation

## 2012-11-13 DIAGNOSIS — Z9889 Other specified postprocedural states: Secondary | ICD-10-CM | POA: Insufficient documentation

## 2012-11-13 DIAGNOSIS — M129 Arthropathy, unspecified: Secondary | ICD-10-CM | POA: Insufficient documentation

## 2012-11-13 DIAGNOSIS — Z79899 Other long term (current) drug therapy: Secondary | ICD-10-CM | POA: Insufficient documentation

## 2012-11-13 DIAGNOSIS — Z8719 Personal history of other diseases of the digestive system: Secondary | ICD-10-CM | POA: Insufficient documentation

## 2012-11-13 DIAGNOSIS — Z8673 Personal history of transient ischemic attack (TIA), and cerebral infarction without residual deficits: Secondary | ICD-10-CM | POA: Insufficient documentation

## 2012-11-13 DIAGNOSIS — I10 Essential (primary) hypertension: Secondary | ICD-10-CM | POA: Insufficient documentation

## 2012-11-13 DIAGNOSIS — I2510 Atherosclerotic heart disease of native coronary artery without angina pectoris: Secondary | ICD-10-CM | POA: Insufficient documentation

## 2012-11-13 DIAGNOSIS — Z87448 Personal history of other diseases of urinary system: Secondary | ICD-10-CM | POA: Insufficient documentation

## 2012-11-13 DIAGNOSIS — K219 Gastro-esophageal reflux disease without esophagitis: Secondary | ICD-10-CM | POA: Insufficient documentation

## 2012-11-13 DIAGNOSIS — Z8551 Personal history of malignant neoplasm of bladder: Secondary | ICD-10-CM | POA: Insufficient documentation

## 2012-11-13 DIAGNOSIS — M79609 Pain in unspecified limb: Secondary | ICD-10-CM | POA: Insufficient documentation

## 2012-11-13 DIAGNOSIS — Z8619 Personal history of other infectious and parasitic diseases: Secondary | ICD-10-CM | POA: Insufficient documentation

## 2012-11-13 DIAGNOSIS — E119 Type 2 diabetes mellitus without complications: Secondary | ICD-10-CM | POA: Insufficient documentation

## 2012-11-13 DIAGNOSIS — K5900 Constipation, unspecified: Secondary | ICD-10-CM | POA: Insufficient documentation

## 2012-11-13 DIAGNOSIS — Z794 Long term (current) use of insulin: Secondary | ICD-10-CM | POA: Insufficient documentation

## 2012-11-13 DIAGNOSIS — M1990 Unspecified osteoarthritis, unspecified site: Secondary | ICD-10-CM | POA: Insufficient documentation

## 2012-11-13 DIAGNOSIS — F329 Major depressive disorder, single episode, unspecified: Secondary | ICD-10-CM | POA: Insufficient documentation

## 2012-11-13 LAB — BASIC METABOLIC PANEL
BUN: 20 mg/dL (ref 6–23)
Calcium: 9.4 mg/dL (ref 8.4–10.5)
Creatinine, Ser: 1.03 mg/dL (ref 0.50–1.35)
GFR calc Af Amer: 72 mL/min — ABNORMAL LOW (ref >90)

## 2012-11-13 LAB — CBC
HCT: 37.7 % — ABNORMAL LOW (ref 39.0–52.0)
MCH: 32.3 pg (ref 26.0–34.0)
MCV: 95.2 fL (ref 78.0–100.0)
Platelets: 162 10*3/uL (ref 150–400)
RDW: 13 % (ref 11.5–15.5)

## 2012-11-13 MED ORDER — ONDANSETRON HCL 4 MG/2ML IJ SOLN
4.0000 mg | Freq: Once | INTRAMUSCULAR | Status: AC
  Administered 2012-11-13: 4 mg via INTRAVENOUS
  Filled 2012-11-13: qty 2

## 2012-11-13 MED ORDER — DOCUSATE SODIUM 250 MG PO CAPS: 250.0000 mg | ORAL_CAPSULE | Freq: Every day | ORAL | Status: DC

## 2012-11-13 MED ORDER — SODIUM CHLORIDE 0.9 % IV BOLUS (SEPSIS)
500.0000 mL | Freq: Once | INTRAVENOUS | Status: AC
  Administered 2012-11-13: 500 mL via INTRAVENOUS

## 2012-11-13 MED ORDER — MAGNESIUM CITRATE PO SOLN
1.0000 | Freq: Once | ORAL | Status: AC
  Administered 2012-11-13: 1 via ORAL
  Filled 2012-11-13: qty 296

## 2012-11-13 MED ORDER — HYDROMORPHONE HCL PF 1 MG/ML IJ SOLN
1.0000 mg | Freq: Once | INTRAMUSCULAR | Status: AC
  Administered 2012-11-13: 1 mg via INTRAVENOUS
  Filled 2012-11-13: qty 1

## 2012-11-13 NOTE — Progress Notes (Signed)
[facility] ED CM consulted by EDP, Linker about home health This Cm set up home health services for this pt on 11/09/12 with [facility] CM consulted Darl Pikes of [facility] about progression of pt iervices.  Reports pt was scheduled to be seen on 11/13/12.  Cm reported pt presently in [facility] ED.  [facility] staff offered to speak with wife who is with pt in ED.  CM provided [facility] ED contact number.

## 2012-11-13 NOTE — ED Notes (Signed)
Attempted to round - social work with pt

## 2012-11-13 NOTE — ED Notes (Addendum)
Per EMS: Pt c/o of 10/10 lower back pain and bilateral leg pain. Pt was recently diagnosed with sciatica two weeks ago. Pt was seen her two days ago for the same symptoms.  Pt reports he has not had a bowel movement in 10 days. Pt reports not urinating in the past day because he has been unable to stand and can only void with standing. Pt reports he has been unable to stand due to the pain.

## 2012-11-13 NOTE — ED Notes (Signed)
Pt reports feeling unable to coordinate movements. Neuro check unremarkable. Vitals rechecked.  EDP Notified.

## 2012-11-13 NOTE — Progress Notes (Signed)
CM spoke with spouse at bedside who confirms she did receive a call from [place] while she has been with the husband (prior to CM arrival) Cm reviewed with wife medicare guidelines for admission and private duty nursing Provided spouse with list of private duty nursing agencies She identified a private duty nursing agency close to her home residence and stated she would call them to see "what they cost"  Cm answered questions about transportation home via ambulance. CM also provided in a pt belonging bag a urinal and bed pan.  Order for bedside commode from Dr linker called in to [facility] staff, [place] [phone] This DME will be delivered to the home address.  CM and wife reviewed some interventions attempted for pt's constipation (stool softeners, prunes, lots of fluids, vegetables, fruit, mobility, decrease of constipating medications) The spouse also states she will contact [facility] for possible resources for pt The wife allowed to ventilate her feelings about pt's increased pain, constipation and difficulty voiding related to prostrate issues.  Reporting he his on 4 prescriptions for pain CM and EDP discussed 4 prescriptions and note pt on neurontin, percocet, tylenol and tramadol. Spouse inquired about an injection and CM discussed this with EDP

## 2012-11-13 NOTE — ED Notes (Signed)
Attempted rounding, nurse in with pt

## 2012-11-13 NOTE — ED Notes (Signed)
ZOX:[id number]<AV> Expected date:<BR> Expected time:<BR> Means of arrival:<BR> Comments:<BR> 89yoM- sciatica, no BM in 10 days

## 2012-11-13 NOTE — ED Provider Notes (Signed)
I saw and evaluated the patient, reviewed the resident's note and I agree with the findings and plan.   .Face to face Exam:  General:  Awake HEENT:  Atraumatic Resp:  Normal effort Abd:  Nondistended Neuro:No focal weakness Lymph: No adenopathy  Nelia Shi, MD 11/13/12 2322

## 2012-11-13 NOTE — Progress Notes (Signed)
Spouse reports medications filled primary through [facility] and at [facility] for emergencies.  Reports [facility] assisted her in getting 2/10/;14 rx filled at [facility]

## 2012-11-13 NOTE — Progress Notes (Signed)
CM offered to assist with getting a hospital bed (to assist with pt raising up in bed to get to side of bed to void- so spouse would not have to lift pt) but wife refused

## 2012-11-13 NOTE — ED Provider Notes (Signed)
History     CSN: [id number]  Arrival date & time 11/13/12  1149   First MD Initiated Contact with Patient 11/13/12 1507      Chief Complaint  Patient presents with  . Back Pain  . Leg Pain  . Constipation    (Consider location/radiation/quality/duration/timing/severity/associated sxs/prior treatment) HPI Pt presenting with low back pain and pain radiating down to legs bilaterally.  Pain is worse with movement and palpation.  He had MRI several days ago at [facility] and diagnosed with sciatica.  Pt has had difficulty moving and getting out of bed.  No urinary retention or incontinence of bowel or bladder.  Also c/o symptoms of constipation.  Has not been able to have bowel movement in 10 days.  No fever or vomiting.  Has been taking neurotin and tramdol for pain.  Percocet was added 2 days ago which he states has not helped very much with pain.  No weakness of legs.  There are no other associated systemic symptoms, there are no other alleviating or modifying factors.  Past Medical History  Diagnosis Date  . Hypertension   . Diabetes mellitus without complication   . Arthritis   . Sciatica   . GERD (gastroesophageal reflux disease)   . Cancer     Prostate, Lung    Past Surgical History  Procedure Laterality Date  . Coronary angioplasty with stent placement      No family history on file.  History  Substance Use Topics  . Smoking status: Not on file  . Smokeless tobacco: Not on file  . Alcohol Use: No      Review of Systems ROS reviewed and all otherwise negative except for mentioned in HPI  Allergies  Albuterol sulfate hfa; Adhesive; Penicillins; and Sulfa drugs cross reactors  Home Medications   Current Outpatient Rx  Name  Route  Sig  Dispense  Refill  . acetaminophen (TYLENOL) 325 MG tablet   Oral   Take 325 mg by mouth every 6 (six) hours as needed for pain.          [name] aspirin EC 81 MG tablet   Oral   Take 81 mg by mouth every evening.          .  bicalutamide (CASODEX) 50 MG tablet   Oral   Take 50 mg by mouth daily.         . Calcium Carbonate-Vitamin D (CALCIUM 600+D HIGH POTENCY) 600-400 MG-UNIT per tablet   Oral   Take 1 tablet by mouth 2 (two) times daily.           . cycloSPORINE (RESTASIS) 0.05 % ophthalmic emulsion   Both Eyes   Place 1 drop into both eyes 2 (two) times daily.           [name] docusate sodium (COLACE) 100 MG capsule   Oral   Take 100 mg by mouth 2 (two) times daily.          . fluocinonide (LIDEX) 0.05 % cream   Topical   Apply 1 application topically 2 (two) times daily.           [name] gabapentin (NEURONTIN) 100 MG capsule   Oral   Take 100 mg by mouth 3 (three) times daily.          [name] glimepiride (AMARYL) 4 MG tablet   Oral   Take 4 mg by mouth daily before breakfast.           . insulin glargine (  LANTUS) 100 UNIT/ML injection   Subcutaneous   Inject 12 Units into the skin at bedtime.          [name] Leuprolide Acetate (LUPRON IJ)   Injection   Inject 1 each as directed as directed. He receives every 6 months at [facility].         . loratadine (CLARITIN) 10 MG tablet   Oral   Take 10 mg by mouth daily as needed for allergies.          [name] losartan (COZAAR) 50 MG tablet   Oral   Take 50 mg by mouth every morning.           . meclizine (ANTIVERT) 25 MG tablet   Oral   Take 25 mg by mouth 3 (three) times daily as needed for dizziness.          . metoprolol (LOPRESSOR) 50 MG tablet   Oral   Take 25 mg by mouth 2 (two) times daily.           . Multiple Vitamins-Minerals (MULTIVITAMINS THER. W/MINERALS) TABS   Oral   Take 1 tablet by mouth every morning.          . nitroGLYCERIN (NITROSTAT) 0.4 MG SL tablet   Sublingual   Place 0.4 mg under the tongue every 5 (five) minutes as needed for chest pain.          [name] omeprazole (PRILOSEC) 20 MG capsule   Oral   Take 20 mg by mouth 2 (two) times daily.           [name] oxybutynin (DITROPAN) 5 MG  tablet   Oral   Take 5 mg by mouth at bedtime.         [name] oxyCODONE-acetaminophen (PERCOCET) 10-325 MG per tablet   Oral   Take 1 tablet by mouth every 4 (four) hours as needed for pain.   30 tablet   0   . polyethylene glycol (MIRALAX / GLYCOLAX) packet   Oral   Take 17 g by mouth daily.   14 each   0   . sertraline (ZOLOFT) 100 MG tablet   Oral   Take 200 mg by mouth at bedtime.          . simvastatin (ZOCOR) 80 MG tablet   Oral   Take 40 mg by mouth at bedtime.          . traMADol (ULTRAM) 50 MG tablet   Oral   Take 50 mg by mouth every 8 (eight) hours as needed for pain.          [name] docusate sodium (COLACE) 250 MG capsule   Oral   Take 1 capsule (250 mg total) by mouth daily.   10 capsule   0     BP 180/85  Pulse 68  Temp(Src) 98 F (36.7 C) (Oral)  Resp 14  SpO2 98% Vitals reviewed Physical Exam Physical Examination: General appearance - alert, well appearing, and in no distress Mental status - alert, oriented to person, place, and time Mouth - mucous membranes moist, pharynx normal without lesions Chest - clear to auscultation, no wheezes, rales or rhonchi, symmetric air entry Heart - normal rate, regular rhythm, normal S1, S2, no murmurs, rubs, clicks or gallops Abdomen - soft, nontender, nondistended, no masses or organomegaly Back exam - ttp in bilateral paraspinous region bilaterall Neurological - alert, oriented, normal speech, strength 5/5 in extremities x 4, sensation intact in extremities Extremities -  peripheral pulses normal, no pedal edema, no clubbing or cyanosis Skin - normal coloration and turgor, no rashes  ED Course  Procedures (including critical care time)  3:27 PM  D/w Case management- she has seen patient 2 days ago and worked on home health.  They have not been able to go presumably because of the weather.  She will find out about home health and talk with wife about private nursing.   5:08 PM care manager has confirmed that  home health was to come today but patient was not there, they were here in the ED.  Have given wife information about private nursing.  Also ordered bedside commode for home use.  Pt feels much better with pain after dilaudid.  Did briefly have desat after dilaudid.   Labs Reviewed  CBC - Abnormal; Notable for the following:    RBC 3.96 (*)    Hemoglobin 12.8 (*)    HCT 37.7 (*)    All other components within normal limits  BASIC METABOLIC PANEL - Abnormal; Notable for the following:    Sodium 133 (*)    Glucose, Bld 118 (*)    GFR calc non Af Amer 62 (*)    GFR calc Af Amer 72 (*)    All other components within normal limits   No results found.   1. Sciatica       MDM  Pt presenting with c/o low back pain- diagnosed with sciatica- is having difficult time moving around at home due to the pain.  No signs of symptoms of cauda equina, recent MR results reviewed from [facility] in epic.  Pain relieved with IV dilaudid here in the ED- pt was very drowsy after this but has returned to baseline now.  D/w case manager who has followed up with home health, wife is going to contact private nursing as well.  I have also ordered bedside commode for home use.  Discharged with strict return precautions.  Pt agreeable with plan.        Ethelda Chick, MD 11/13/12 [phone]

## 2012-11-14 ENCOUNTER — Emergency Department (HOSPITAL_COMMUNITY): Payer: Medicare Other

## 2012-11-14 ENCOUNTER — Encounter (HOSPITAL_COMMUNITY): Payer: Self-pay | Admitting: *Deleted

## 2012-11-14 ENCOUNTER — Inpatient Hospital Stay (HOSPITAL_COMMUNITY)
Admission: EM | Admit: 2012-11-14 | Discharge: 2012-11-23 | DRG: 982 | Disposition: A | Discharge status: Skilled Nursing Facility | Payer: Medicare Other | Attending: Internal Medicine | Admitting: Internal Medicine

## 2012-11-14 DIAGNOSIS — R531 Weakness: Secondary | ICD-10-CM

## 2012-11-14 DIAGNOSIS — R339 Retention of urine, unspecified: Secondary | ICD-10-CM | POA: Diagnosis present

## 2012-11-14 DIAGNOSIS — E871 Hypo-osmolality and hyponatremia: Secondary | ICD-10-CM | POA: Diagnosis present

## 2012-11-14 DIAGNOSIS — R52 Pain, unspecified: Secondary | ICD-10-CM

## 2012-11-14 DIAGNOSIS — N390 Urinary tract infection, site not specified: Secondary | ICD-10-CM | POA: Diagnosis present

## 2012-11-14 DIAGNOSIS — S32009A Unspecified fracture of unspecified lumbar vertebra, initial encounter for closed fracture: Secondary | ICD-10-CM | POA: Diagnosis present

## 2012-11-14 DIAGNOSIS — Z880 Allergy status to penicillin: Secondary | ICD-10-CM

## 2012-11-14 DIAGNOSIS — I10 Essential (primary) hypertension: Secondary | ICD-10-CM | POA: Diagnosis present

## 2012-11-14 DIAGNOSIS — Z8673 Personal history of transient ischemic attack (TIA), and cerebral infarction without residual deficits: Secondary | ICD-10-CM

## 2012-11-14 DIAGNOSIS — S32040A Wedge compression fracture of fourth lumbar vertebra, initial encounter for closed fracture: Secondary | ICD-10-CM

## 2012-11-14 DIAGNOSIS — D72829 Elevated white blood cell count, unspecified: Secondary | ICD-10-CM | POA: Diagnosis present

## 2012-11-14 DIAGNOSIS — R748 Abnormal levels of other serum enzymes: Secondary | ICD-10-CM | POA: Diagnosis present

## 2012-11-14 DIAGNOSIS — R188 Other ascites: Secondary | ICD-10-CM | POA: Diagnosis present

## 2012-11-14 DIAGNOSIS — M48061 Spinal stenosis, lumbar region without neurogenic claudication: Secondary | ICD-10-CM | POA: Diagnosis present

## 2012-11-14 DIAGNOSIS — E785 Hyperlipidemia, unspecified: Secondary | ICD-10-CM | POA: Diagnosis present

## 2012-11-14 DIAGNOSIS — D638 Anemia in other chronic diseases classified elsewhere: Secondary | ICD-10-CM | POA: Diagnosis present

## 2012-11-14 DIAGNOSIS — C61 Malignant neoplasm of prostate: Secondary | ICD-10-CM | POA: Diagnosis present

## 2012-11-14 DIAGNOSIS — G92 Toxic encephalopathy: Principal | ICD-10-CM | POA: Diagnosis present

## 2012-11-14 DIAGNOSIS — R0989 Other specified symptoms and signs involving the circulatory and respiratory systems: Secondary | ICD-10-CM | POA: Diagnosis present

## 2012-11-14 DIAGNOSIS — Z66 Do not resuscitate: Secondary | ICD-10-CM | POA: Diagnosis present

## 2012-11-14 DIAGNOSIS — R0600 Dyspnea, unspecified: Secondary | ICD-10-CM | POA: Diagnosis present

## 2012-11-14 DIAGNOSIS — K219 Gastro-esophageal reflux disease without esophagitis: Secondary | ICD-10-CM | POA: Diagnosis present

## 2012-11-14 DIAGNOSIS — X58XXXA Exposure to other specified factors, initial encounter: Secondary | ICD-10-CM | POA: Diagnosis present

## 2012-11-14 DIAGNOSIS — D649 Anemia, unspecified: Secondary | ICD-10-CM | POA: Diagnosis present

## 2012-11-14 DIAGNOSIS — R2681 Unsteadiness on feet: Secondary | ICD-10-CM | POA: Diagnosis present

## 2012-11-14 DIAGNOSIS — T50995A Adverse effect of other drugs, medicaments and biological substances, initial encounter: Secondary | ICD-10-CM | POA: Diagnosis present

## 2012-11-14 DIAGNOSIS — I2510 Atherosclerotic heart disease of native coronary artery without angina pectoris: Secondary | ICD-10-CM | POA: Diagnosis present

## 2012-11-14 DIAGNOSIS — C7800 Secondary malignant neoplasm of unspecified lung: Secondary | ICD-10-CM | POA: Diagnosis present

## 2012-11-14 DIAGNOSIS — E119 Type 2 diabetes mellitus without complications: Secondary | ICD-10-CM | POA: Diagnosis present

## 2012-11-14 DIAGNOSIS — G929 Unspecified toxic encephalopathy: Principal | ICD-10-CM | POA: Diagnosis present

## 2012-11-14 DIAGNOSIS — Z79899 Other long term (current) drug therapy: Secondary | ICD-10-CM

## 2012-11-14 DIAGNOSIS — R269 Unspecified abnormalities of gait and mobility: Secondary | ICD-10-CM | POA: Diagnosis present

## 2012-11-14 DIAGNOSIS — E860 Dehydration: Secondary | ICD-10-CM

## 2012-11-14 DIAGNOSIS — Z794 Long term (current) use of insulin: Secondary | ICD-10-CM

## 2012-11-14 DIAGNOSIS — Z87891 Personal history of nicotine dependence: Secondary | ICD-10-CM

## 2012-11-14 DIAGNOSIS — R0609 Other forms of dyspnea: Secondary | ICD-10-CM | POA: Diagnosis present

## 2012-11-14 DIAGNOSIS — R972 Elevated prostate specific antigen [PSA]: Secondary | ICD-10-CM | POA: Diagnosis present

## 2012-11-14 DIAGNOSIS — M5430 Sciatica, unspecified side: Secondary | ICD-10-CM | POA: Diagnosis present

## 2012-11-14 HISTORY — DX: Male erectile dysfunction, unspecified: N52.9

## 2012-11-14 HISTORY — DX: Malignant neoplasm of bladder, unspecified: C67.9

## 2012-11-14 HISTORY — DX: Unspecified osteoarthritis, unspecified site: M19.90

## 2012-11-14 HISTORY — DX: Polyp of stomach and duodenum: K31.7

## 2012-11-14 HISTORY — DX: Atherosclerotic heart disease of native coronary artery without angina pectoris: I25.10

## 2012-11-14 HISTORY — DX: Hyperlipidemia, unspecified: E78.5

## 2012-11-14 HISTORY — DX: Cerebral infarction, unspecified: I63.9

## 2012-11-14 HISTORY — DX: Personal history of other medical treatment: Z92.89

## 2012-11-14 HISTORY — DX: Major depressive disorder, single episode, unspecified: F32.9

## 2012-11-14 HISTORY — DX: Age-related osteoporosis without current pathological fracture: M81.0

## 2012-11-14 HISTORY — DX: Dizziness and giddiness: R42

## 2012-11-14 HISTORY — DX: Depression, unspecified: F32.A

## 2012-11-14 HISTORY — DX: Decreased libido: R68.82

## 2012-11-14 HISTORY — DX: Urge incontinence: N39.41

## 2012-11-14 HISTORY — DX: Malignant neoplasm of prostate: C61

## 2012-11-14 HISTORY — DX: Hepatitis a without hepatic coma: B15.9

## 2012-11-14 LAB — CBC WITH DIFFERENTIAL/PLATELET
Basophils Absolute: 0 10*3/uL (ref 0.0–0.1)
Basophils Relative: 0 % (ref 0–1)
Eosinophils Relative: 0 % (ref 0–5)
HCT: 36.5 % — ABNORMAL LOW (ref 39.0–52.0)
Lymphocytes Relative: 7 % — ABNORMAL LOW (ref 12–46)
MCHC: 34.2 g/dL (ref 30.0–36.0)
MCV: 96.3 fL (ref 78.0–100.0)
Monocytes Absolute: 0.9 10*3/uL (ref 0.1–1.0)
Neutro Abs: 10.3 10*3/uL — ABNORMAL HIGH (ref 1.7–7.7)
Platelets: 183 10*3/uL (ref 150–400)
RDW: 13.1 % (ref 11.5–15.5)
WBC: 12 10*3/uL — ABNORMAL HIGH (ref 4.0–10.5)

## 2012-11-14 LAB — URINALYSIS, ROUTINE W REFLEX MICROSCOPIC
Leukocytes, UA: NEGATIVE
Nitrite: NEGATIVE
Protein, ur: NEGATIVE mg/dL
Urobilinogen, UA: 0.2 mg/dL (ref 0.0–1.0)

## 2012-11-14 LAB — BASIC METABOLIC PANEL
CO2: 30 mEq/L (ref 19–32)
Calcium: 9.7 mg/dL (ref 8.4–10.5)
Creatinine, Ser: 1.12 mg/dL (ref 0.50–1.35)
GFR calc Af Amer: 65 mL/min — ABNORMAL LOW (ref >90)
GFR calc non Af Amer: 56 mL/min — ABNORMAL LOW (ref >90)
Sodium: 133 mEq/L — ABNORMAL LOW (ref 135–145)

## 2012-11-14 MED ORDER — MECLIZINE HCL 25 MG PO TABS
25.0000 mg | ORAL_TABLET | Freq: Three times a day (TID) | ORAL | Status: DC | PRN
  Filled 2012-11-14: qty 1

## 2012-11-14 MED ORDER — CALCIUM CARBONATE-VITAMIN D 500-200 MG-UNIT PO TABS
1.0000 | ORAL_TABLET | Freq: Two times a day (BID) | ORAL | Status: DC
Start: 2012-11-14 — End: 2012-11-23
  Administered 2012-11-14 – 2012-11-23 (×18): 1 via ORAL
  Filled 2012-11-14 (×21): qty 1

## 2012-11-14 MED ORDER — OXYCODONE-ACETAMINOPHEN 5-325 MG PO TABS
1.0000 | ORAL_TABLET | ORAL | Status: DC | PRN
  Administered 2012-11-14 – 2012-11-16 (×5): 1 via ORAL
  Filled 2012-11-14 (×6): qty 1

## 2012-11-14 MED ORDER — TRAMADOL HCL 50 MG PO TABS
50.0000 mg | ORAL_TABLET | Freq: Three times a day (TID) | ORAL | Status: DC | PRN
  Administered 2012-11-15: 50 mg via ORAL
  Filled 2012-11-14: qty 1

## 2012-11-14 MED ORDER — CYCLOSPORINE 0.05 % OP EMUL
1.0000 [drp] | Freq: Two times a day (BID) | OPHTHALMIC | Status: DC
  Administered 2012-11-14 – 2012-11-23 (×17): 1 [drp] via OPHTHALMIC
  Filled 2012-11-14 (×22): qty 1

## 2012-11-14 MED ORDER — GLIMEPIRIDE 4 MG PO TABS
4.0000 mg | ORAL_TABLET | Freq: Every day | ORAL | Status: DC
  Administered 2012-11-15 – 2012-11-22 (×7): 4 mg via ORAL
  Filled 2012-11-14 (×12): qty 1

## 2012-11-14 MED ORDER — IBUPROFEN 600 MG PO TABS
600.0000 mg | ORAL_TABLET | Freq: Three times a day (TID) | ORAL | Status: DC | PRN
  Administered 2012-11-14: 600 mg via ORAL
  Filled 2012-11-14: qty 1
  Filled 2012-11-14: qty 3

## 2012-11-14 MED ORDER — LORATADINE 10 MG PO TABS
10.0000 mg | ORAL_TABLET | Freq: Every day | ORAL | Status: DC | PRN
  Filled 2012-11-14: qty 1

## 2012-11-14 MED ORDER — LORAZEPAM 1 MG PO TABS
1.0000 mg | ORAL_TABLET | Freq: Three times a day (TID) | ORAL | Status: DC | PRN
  Administered 2012-11-15 – 2012-11-18 (×2): 1 mg via ORAL
  Filled 2012-11-14 (×2): qty 1

## 2012-11-14 MED ORDER — ATORVASTATIN CALCIUM 40 MG PO TABS
40.0000 mg | ORAL_TABLET | Freq: Every day | ORAL | Status: DC
  Administered 2012-11-14 – 2012-11-22 (×9): 40 mg via ORAL
  Filled 2012-11-14 (×11): qty 1

## 2012-11-14 MED ORDER — PANTOPRAZOLE SODIUM 40 MG PO TBEC
40.0000 mg | DELAYED_RELEASE_TABLET | Freq: Every day | ORAL | Status: DC
  Administered 2012-11-14 – 2012-11-23 (×10): 40 mg via ORAL
  Filled 2012-11-14 (×10): qty 1

## 2012-11-14 MED ORDER — GABAPENTIN 100 MG PO CAPS
100.0000 mg | ORAL_CAPSULE | Freq: Three times a day (TID) | ORAL | Status: DC
  Administered 2012-11-14 – 2012-11-23 (×27): 100 mg via ORAL
  Filled 2012-11-14 (×32): qty 1

## 2012-11-14 MED ORDER — OXYBUTYNIN CHLORIDE 5 MG PO TABS
5.0000 mg | ORAL_TABLET | Freq: Every day | ORAL | Status: DC
  Administered 2012-11-14 – 2012-11-22 (×9): 5 mg via ORAL
  Filled 2012-11-14 (×12): qty 1

## 2012-11-14 MED ORDER — LOSARTAN POTASSIUM 50 MG PO TABS
50.0000 mg | ORAL_TABLET | Freq: Every day | ORAL | Status: DC
  Administered 2012-11-14 – 2012-11-23 (×10): 50 mg via ORAL
  Filled 2012-11-14 (×11): qty 1

## 2012-11-14 MED ORDER — INSULIN GLARGINE 100 UNIT/ML ~~LOC~~ SOLN
16.0000 [IU] | Freq: Every day | SUBCUTANEOUS | Status: DC
  Administered 2012-11-14 – 2012-11-17 (×4): 16 [IU] via SUBCUTANEOUS
  Filled 2012-11-14 (×2): qty 1

## 2012-11-14 MED ORDER — OXYCODONE HCL 5 MG PO TABS
5.0000 mg | ORAL_TABLET | ORAL | Status: DC | PRN
  Administered 2012-11-15 – 2012-11-16 (×3): 5 mg via ORAL
  Filled 2012-11-14 (×3): qty 1

## 2012-11-14 MED ORDER — ADULT MULTIVITAMIN W/MINERALS CH
1.0000 | ORAL_TABLET | Freq: Every day | ORAL | Status: DC
  Administered 2012-11-15 – 2012-11-23 (×9): 1 via ORAL
  Filled 2012-11-14 (×9): qty 1

## 2012-11-14 MED ORDER — CALCIUM CARBONATE-VITAMIN D 600-400 MG-UNIT PO TABS: 1.0000 | ORAL_TABLET | Freq: Two times a day (BID) | ORAL | Status: DC

## 2012-11-14 MED ORDER — SERTRALINE HCL 100 MG PO TABS
200.0000 mg | ORAL_TABLET | Freq: Every day | ORAL | Status: DC
  Administered 2012-11-14 – 2012-11-22 (×9): 200 mg via ORAL
  Filled 2012-11-14 (×9): qty 2
  Filled 2012-11-14: qty 4
  Filled 2012-11-14: qty 2

## 2012-11-14 MED ORDER — ASPIRIN EC 81 MG PO TBEC
81.0000 mg | DELAYED_RELEASE_TABLET | Freq: Every evening | ORAL | Status: DC
  Administered 2012-11-14 – 2012-11-22 (×9): 81 mg via ORAL
  Filled 2012-11-14 (×11): qty 1

## 2012-11-14 MED ORDER — METOPROLOL TARTRATE 25 MG PO TABS
25.0000 mg | ORAL_TABLET | Freq: Two times a day (BID) | ORAL | Status: DC
  Administered 2012-11-14 – 2012-11-18 (×9): 25 mg via ORAL
  Filled 2012-11-14 (×13): qty 1

## 2012-11-14 MED ORDER — ACETAMINOPHEN 325 MG PO TABS: 650.0000 mg | ORAL_TABLET | ORAL | Status: DC | PRN

## 2012-11-14 MED ORDER — THERA M PLUS PO TABS: 1.0000 | ORAL_TABLET | Freq: Every morning | ORAL | Status: DC

## 2012-11-14 MED ORDER — BICALUTAMIDE 50 MG PO TABS
50.0000 mg | ORAL_TABLET | Freq: Every day | ORAL | Status: DC
  Administered 2012-11-14 – 2012-11-23 (×10): 50 mg via ORAL
  Filled 2012-11-14 (×14): qty 1

## 2012-11-14 MED ORDER — MORPHINE SULFATE 4 MG/ML IJ SOLN
4.0000 mg | Freq: Once | INTRAMUSCULAR | Status: AC
  Administered 2012-11-14: 4 mg via INTRAVENOUS
  Filled 2012-11-14: qty 1

## 2012-11-14 MED ORDER — OXYCODONE-ACETAMINOPHEN 10-325 MG PO TABS: 1.0000 | ORAL_TABLET | ORAL | Status: DC | PRN

## 2012-11-14 NOTE — ED Notes (Signed)
Pt comes in from home with c/o lower extremity leg pain. Pt was recently here yesterday and was diagnosed with sciatica pain and was d/c with pain meds. Pt has been unable to get out of bed or ambulate to get around. Pt stays at home with his elderly and frail 90lb wife that is unable to care for him. EMS talked with pts PCP Dr. Casimiro Needle Altheimer prior to arrival and states that pt needs admission to rehab or to hospital for further evaluation.

## 2012-11-14 NOTE — ED Notes (Signed)
ZOX:WR60<AV> Expected date:11/14/12<BR> Expected time: 9:50 AM<BR> Means of arrival:Ambulance<BR> Comments:<BR> Leg pain

## 2012-11-14 NOTE — ED Provider Notes (Signed)
History     CSN: [id number]  Arrival date & time 11/14/12  1009   First MD Initiated Contact with Patient 11/14/12 1022      Chief Complaint  Patient presents with  . Leg Pain    (Consider location/radiation/quality/duration/timing/severity/associated sxs/prior treatment) The history is provided by the patient and the spouse.  Guy [name] is a 79 y.o. male  History of prostate cancer, hypertension, diabetes here presenting with worsening sciatica. Here symptoms of sciatica for the last 3 weeks.  Denies any falls. This is his fourth ER visit currently. He had MRI done at [facility] that showed sciatica and was sent home on Ultram, Percocet.  He came in yesterday to be evaluated and case manager saw him and with he was set up for home care. He lives at home with wife was unable to take care of him.  She says that she was unable to get off the bed didn't yesterday.  Denies any fevers or chills or new injuries. Called PMD was sent here for rehabilitation or nursing home placement.    Past Medical History  Diagnosis Date  . Hypertension   . Diabetes mellitus without complication   . Arthritis   . Sciatica   . GERD (gastroesophageal reflux disease)   . Cancer     Prostate, Lung    Past Surgical History  Procedure Laterality Date  . Coronary angioplasty with stent placement      History reviewed. No pertinent family history.  History  Substance Use Topics  . Smoking status: Not on file  . Smokeless tobacco: Not on file  . Alcohol Use: No      Review of Systems  Musculoskeletal: Positive for back pain.  All other systems reviewed and are negative.    Allergies  Albuterol sulfate hfa; Adhesive; Penicillins; and Sulfa drugs cross reactors  Home Medications   Current Outpatient Rx  Name  Route  Sig  Dispense  Refill  . acetaminophen (TYLENOL) 325 MG tablet   Oral   Take 325 mg by mouth daily.          [name] aspirin EC 81 MG tablet   Oral   Take 81 mg by mouth every  evening.          . bicalutamide (CASODEX) 50 MG tablet   Oral   Take 50 mg by mouth daily.         . Calcium Carbonate-Vitamin D (CALCIUM 600+D HIGH POTENCY) 600-400 MG-UNIT per tablet   Oral   Take 1 tablet by mouth 2 (two) times daily.           . cycloSPORINE (RESTASIS) 0.05 % ophthalmic emulsion   Both Eyes   Place 1 drop into both eyes 2 (two) times daily.           [name] docusate sodium (COLACE) 100 MG capsule   Oral   Take 100 mg by mouth 2 (two) times daily.          [name] docusate sodium (COLACE) 250 MG capsule   Oral   Take 1 capsule (250 mg total) by mouth daily.   10 capsule   0   . fluocinonide (LIDEX) 0.05 % cream   Topical   Apply 1 application topically 2 (two) times daily.           [name] gabapentin (NEURONTIN) 100 MG capsule   Oral   Take 100 mg by mouth 3 (three) times daily.          [name]  glimepiride (AMARYL) 4 MG tablet   Oral   Take 4 mg by mouth daily before breakfast.           . insulin glargine (LANTUS) 100 UNIT/ML injection   Subcutaneous   Inject 16 Units into the skin at bedtime.          [name] Leuprolide Acetate (LUPRON IJ)   Injection   Inject 1 each as directed as directed. He receives every 6 months at [facility].         . loratadine (CLARITIN) 10 MG tablet   Oral   Take 10 mg by mouth daily as needed for allergies.          [name] losartan (COZAAR) 50 MG tablet   Oral   Take 50 mg by mouth every morning.           . meclizine (ANTIVERT) 25 MG tablet   Oral   Take 25 mg by mouth 3 (three) times daily as needed for dizziness.          . metoprolol (LOPRESSOR) 50 MG tablet   Oral   Take 25 mg by mouth 2 (two) times daily.           . Multiple Vitamins-Minerals (MULTIVITAMINS THER. W/MINERALS) TABS   Oral   Take 1 tablet by mouth every morning.          . nitroGLYCERIN (NITROSTAT) 0.4 MG SL tablet   Sublingual   Place 0.4 mg under the tongue every 5 (five) minutes as needed for chest pain.           [name] omeprazole (PRILOSEC) 20 MG capsule   Oral   Take 20 mg by mouth 2 (two) times daily.           [name] oxybutynin (DITROPAN) 5 MG tablet   Oral   Take 5 mg by mouth at bedtime.         [name] oxyCODONE-acetaminophen (PERCOCET) 10-325 MG per tablet   Oral   Take 1 tablet by mouth every 4 (four) hours as needed for pain.   30 tablet   0   . polyethylene glycol (MIRALAX / GLYCOLAX) packet   Oral   Take 17 g by mouth daily.   14 each   0   . sertraline (ZOLOFT) 100 MG tablet   Oral   Take 200 mg by mouth at bedtime.          . simvastatin (ZOCOR) 80 MG tablet   Oral   Take 40 mg by mouth at bedtime.          . traMADol (ULTRAM) 50 MG tablet   Oral   Take 50 mg by mouth every 8 (eight) hours as needed for pain.            BP 181/77  Pulse 70  Temp(Src) 97.7 F (36.5 C) (Axillary)  Resp 18  SpO2 97%  Physical Exam  Nursing note and vitals reviewed. Constitutional: He is oriented to person, place, and time. He appears well-developed and well-nourished.  Uncomfortable   HENT:  Head: Normocephalic.  Mouth/Throat: Oropharynx is clear and moist.  Eyes: Conjunctivae are normal. Pupils are equal, round, and reactive to light.  Neck: Normal range of motion. Neck supple.  Cardiovascular: Normal rate, regular rhythm and normal heart sounds.   Pulmonary/Chest: Effort normal and breath sounds normal. No respiratory distress. He has no wheezes. He has no rales.  Abdominal: Soft. Bowel sounds are normal. He exhibits  no distension. There is no tenderness. There is no rebound.  Musculoskeletal:  + paralumbar tenderness, no midline tenderness   Neurological: He is alert and oriented to person, place, and time.  Strength 4/5 bilateral legs. Nl sensation. Neg straight leg raise.   Skin: Skin is warm and dry.  Psychiatric: He has a normal mood and affect. His behavior is normal. Judgment and thought content normal.    ED Course  Procedures (including critical care  time)  Labs Reviewed  CBC WITH DIFFERENTIAL - Abnormal; Notable for the following:    WBC 12.0 (*)    RBC 3.79 (*)    Hemoglobin 12.5 (*)    HCT 36.5 (*)    Neutrophils Relative 86 (*)    Neutro Abs 10.3 (*)    Lymphocytes Relative 7 (*)    All other components within normal limits  BASIC METABOLIC PANEL - Abnormal; Notable for the following:    Sodium 133 (*)    Chloride 95 (*)    Glucose, Bld 194 (*)    BUN 27 (*)    GFR calc non Af Amer 56 (*)    GFR calc Af Amer 65 (*)    All other components within normal limits  URINALYSIS, ROUTINE W REFLEX MICROSCOPIC - Abnormal; Notable for the following:    Ketones, ur TRACE (*)    All other components within normal limits   Dg Chest 1 View  11/14/2012  *RADIOLOGY REPORT*  Clinical Data: Weakness, shortness of breath.  CHEST - 1 VIEW  Comparison: 06/27/2011  Findings: Heart is upper limits normal in size.  Nodular density projects in the left upper lobe.  Cannot exclude pulmonary nodule. This has progressed since prior study.  Otherwise no confluent airspace opacities.  No effusions.  No acute bony abnormality.  Stable deformity of the proximal right humerus.  Degenerative changes in the right AC joint.  IMPRESSION: Enlarging nodular density in the left upper lobe.  Cannot exclude pulmonary nodule/neoplasm.  Recommend further evaluation with chest CT.  Mild COPD.   Original Report Authenticated By: Charlett Nose, M.D.    Ct Chest Wo Contrast  11/14/2012  *RADIOLOGY REPORT*  Clinical Data: Lung mass  CT CHEST WITHOUT CONTRAST  Technique:  Multidetector CT imaging of the chest was performed following the standard protocol without IV contrast.  Comparison: 10/08/2009  Findings: Lobulated and spiculated left upper lobe pulmonary nodule has markedly increased in size from 11 mm to 26 x 16 mm.  Irregular patchy and spiculated density in the right upper lobe posteriorly measures 1.8 x 1.0 cm on image 25.  Adjacent irregular smaller patchy densities are  noted.  Focal ground-glass and irregular opacities in the right lower lobe on image 38 and compress in diameter of 2.6 x 1.6 cm.  9 mm nodule in the right lower lobe on image 49 is stable.  Tiny pleural effusions with basilar atelectasis.  Images of the upper abdomen demonstrate no significant change in the appearance of the liver.  Gastrohepatic ligament nodes are partially imaged.  Three-vessel coronary artery calcifications.  Aortic valve calcifications.  Mitral valve calcifications.  Negative for abnormal mediastinal adenopathy.  Calcified granulomata are noted in the right upper lobe.  Chronic appearing right-sided rib deformities.  Markedly irregular appearance of the proximal right humerus with bony expansion and lytic areas.  This is not significantly changed compared with prior chest radiographs. Stable thoracic spine.  IMPRESSION: Worsening left upper lobe nodule as described worrisome for malignancy.  PET CT is  recommended.  There are multiple other findings worrisome for pulmonary malignancy.  These can also be assessed on the PET CT.  Stable right lower lobe pulmonary nodule.  Calcified granulomata are noted.   Original Report Authenticated By: Jolaine Click, M.D.      No diagnosis found.    MDM  Guy [name] is a 79 y.o. male here with worsening sciatica and unable to function at home. I called social work for placement. Will check labs, give pain meds. Will likely need rehab vs nursing home as he is unable to be taken care of at home. No red flags for spinal compression.   11 AM Social work referred him to get nursing home placement outpatient. However, this is the 4th time he is here and can't take care of him.   12 PM I called hospitalist, Dr. Dionicia Abler, who said that patient doesn't qualify for hospital admission. I called Case Management Windell Moulding), who said that he doesn't qualify to be admitted. However, wife really can't take care of him.   2PM I talked to PCP, Dr. Clarita Leber, who  said that he can't arrange to put him in nursing home. I will place patient in TCU until Monday for placement. Patient has lung nodule that will need to be worked up outpatient.         [name], MD 11/14/12 [phone]

## 2012-11-14 NOTE — ED Notes (Signed)
Pt alert x4 c/o of  chronic back pain, prn meds given v/s 177/65, 99.6, 65 will continue to monitor.

## 2012-11-14 NOTE — Progress Notes (Signed)
Clinical Social Work Department BRIEF PSYCHOSOCIAL ASSESSMENT 11/14/2012  Patient:  Guy [name], Guy [name]     Account Number:  [id number]     Admit date:  11/14/2012  Clinical Social Worker:  Leron Croak, CLINICAL SOCIAL WORKER  Date/Time:  11/14/2012 02:31 PM  Referred by:  Physician  Date Referred:  11/14/2012 Referred for  SNF Placement   Other Referral:   Interview type:  Patient Other interview type:   Wife was at the bedside    PSYCHOSOCIAL DATA Living Status:  WIFE Admitted from facility:   Level of care:   Primary support name:  Jamarkus Lisbon Primary support relationship to patient:  SPOUSE Degree of support available:   Limited supports for the Pt's mobility. Pt does have some Home Health but not sufficient for pt needs.    CURRENT CONCERNS Current Concerns  Other - See comment   Other Concerns:   MD would like assistance with possible placement of pt in a SNF from the ED.    SOCIAL WORK ASSESSMENT / PLAN CSW met with the MD prior to meeting with the Pt and wife. MD stated that the Pt was unable to ambulate and MD would like CSW to assist with getting Pt into a SNF facility. CSW explained that Pt could not be placed in the facility from the ED, however the Pt were able to be admitted then CSW would assist with placement.    CSW met with the wife and Pt at the bedside to assess and determine what processes the Pt ans wife have taken to place Pt. Wife stated that Pt has been to the ED four times recently and that she is "unable to take care of him". Wife stated that they do have home health services but that "they are very limited and when they leave she in unable to care for him." Both Pt and wife are agreeable to placement. CSW explained the process to Pt and wife from both an inpatient and out patient standpoint. Wife was insistant on placement from ED or inpatient and CSW explained how CSW could best assist Pt and wife.  CSW was contacted again by the MD concerning  placement from the ED to a SNF facility. CSW explained that the Pt could be placed from the home setting either by the [facility] agency or Social worker that works with the [facility] agency post d/c. MD is hesistant d/c'ing Pt due to Pt unable to ambulate.  CSW contacted CM concerning assisting Pt with possible HH increase of services and social work Environmental education officer from home for SNF placement. CM asked CSW to have MD place a consult ans CSW relayed information. CSW to follow if additional services are needed.    CSW also consulted CSW Director to see how CSW could assist with placement and CSW confirmed that if pt can pay out of pocket for placement and CSW were able to find a facility to take on a weekend, then the pt would need to be placed from the home setting. CSW Director validated all CSW attempts to assist and will relay information to MD and CM. CSW will continue to assist in any way possible.   Assessment/plan status:  Information/Referral to [facility] Other assessment/ plan:   Information/referral to community resources:   CSW provided the wife with a listing of SNF facilities in the [place] area for possible placement.    PATIENT'S/FAMILY'S RESPONSE TO PLAN OF CARE: Wife and Pt are disappointed with CSW inability to assist with placement, however appreciative  for whatever assistance possible to assist.       Leron Croak, Leeroy Bock [facility] Weekend Coverage [phone]

## 2012-11-15 LAB — GLUCOSE, CAPILLARY
Glucose-Capillary: 139 mg/dL — ABNORMAL HIGH (ref 70–99)
Glucose-Capillary: 90 mg/dL (ref 70–99)

## 2012-11-15 MED ORDER — INSULIN ASPART 100 UNIT/ML ~~LOC~~ SOLN
0.0000 [IU] | Freq: Three times a day (TID) | SUBCUTANEOUS | Status: DC
  Administered 2012-11-15: 2 [IU] via SUBCUTANEOUS
  Administered 2012-11-16: 1 [IU] via SUBCUTANEOUS
  Administered 2012-11-16: 2 [IU] via SUBCUTANEOUS
  Administered 2012-11-17: 3 [IU] via SUBCUTANEOUS
  Administered 2012-11-18 (×2): 2 [IU] via SUBCUTANEOUS
  Administered 2012-11-20 – 2012-11-22 (×2): 3 [IU] via SUBCUTANEOUS
  Administered 2012-11-23: 2 [IU] via SUBCUTANEOUS
  Filled 2012-11-15: qty 1

## 2012-11-15 MED ORDER — INSULIN ASPART 100 UNIT/ML ~~LOC~~ SOLN
0.0000 [IU] | Freq: Every day | SUBCUTANEOUS | Status: DC
  Administered 2012-11-17 – 2012-11-22 (×4): 2 [IU] via SUBCUTANEOUS

## 2012-11-15 NOTE — ED Provider Notes (Signed)
Guy [name] is a 79 y.o. male here with sciatica pain. Placed in TCU for placement issues. Patient resting comfortably this AM, no issues as per nursing. This is Sunday today but will have social work get involved again tomorrow for placement.    [name], MD 11/15/12 [phone]

## 2012-11-15 NOTE — ED Notes (Signed)
Bedside report received from previous RN 

## 2012-11-15 NOTE — ED Notes (Signed)
Pt anxious and restless. PRN ativan given at this time.

## 2012-11-16 ENCOUNTER — Inpatient Hospital Stay (HOSPITAL_COMMUNITY): Payer: Medicare Other

## 2012-11-16 ENCOUNTER — Encounter (HOSPITAL_COMMUNITY): Payer: Self-pay | Admitting: *Deleted

## 2012-11-16 DIAGNOSIS — E119 Type 2 diabetes mellitus without complications: Secondary | ICD-10-CM | POA: Diagnosis present

## 2012-11-16 DIAGNOSIS — G92 Toxic encephalopathy: Secondary | ICD-10-CM | POA: Diagnosis present

## 2012-11-16 DIAGNOSIS — R188 Other ascites: Secondary | ICD-10-CM | POA: Diagnosis present

## 2012-11-16 DIAGNOSIS — E871 Hypo-osmolality and hyponatremia: Secondary | ICD-10-CM | POA: Diagnosis present

## 2012-11-16 DIAGNOSIS — D72829 Elevated white blood cell count, unspecified: Secondary | ICD-10-CM | POA: Diagnosis present

## 2012-11-16 DIAGNOSIS — R52 Pain, unspecified: Secondary | ICD-10-CM | POA: Diagnosis present

## 2012-11-16 DIAGNOSIS — M5430 Sciatica, unspecified side: Secondary | ICD-10-CM

## 2012-11-16 DIAGNOSIS — R2681 Unsteadiness on feet: Secondary | ICD-10-CM | POA: Diagnosis present

## 2012-11-16 DIAGNOSIS — R0600 Dyspnea, unspecified: Secondary | ICD-10-CM | POA: Diagnosis present

## 2012-11-16 DIAGNOSIS — D649 Anemia, unspecified: Secondary | ICD-10-CM | POA: Diagnosis present

## 2012-11-16 LAB — CBC
Hemoglobin: 11.7 g/dL — ABNORMAL LOW (ref 13.0–17.0)
MCH: 32.6 pg (ref 26.0–34.0)
MCHC: 34 g/dL (ref 30.0–36.0)
MCV: 95.8 fL (ref 78.0–100.0)
RBC: 3.59 MIL/uL — ABNORMAL LOW (ref 4.22–5.81)

## 2012-11-16 LAB — CREATININE, SERUM: Creatinine, Ser: 1.31 mg/dL (ref 0.50–1.35)

## 2012-11-16 LAB — GLUCOSE, CAPILLARY
Glucose-Capillary: 107 mg/dL — ABNORMAL HIGH (ref 70–99)
Glucose-Capillary: 132 mg/dL — ABNORMAL HIGH (ref 70–99)
Glucose-Capillary: 132 mg/dL — ABNORMAL HIGH (ref 70–99)

## 2012-11-16 MED ORDER — ACETAMINOPHEN 325 MG PO TABS: 650.0000 mg | ORAL_TABLET | Freq: Four times a day (QID) | ORAL | Status: DC | PRN

## 2012-11-16 MED ORDER — DOCUSATE SODIUM 50 MG PO CAPS: 250.0000 mg | ORAL_CAPSULE | Freq: Every day | ORAL | Status: DC

## 2012-11-16 MED ORDER — ALUM & MAG HYDROXIDE-SIMETH 200-200-20 MG/5ML PO SUSP
30.0000 mL | Freq: Four times a day (QID) | ORAL | Status: DC | PRN
  Administered 2012-11-18: 30 mL via ORAL
  Filled 2012-11-16: qty 30

## 2012-11-16 MED ORDER — SODIUM CHLORIDE 0.9 % IV SOLN
INTRAVENOUS | Status: DC
  Administered 2012-11-16 – 2012-11-17 (×2): via INTRAVENOUS
  Administered 2012-11-17: 1000 mL via INTRAVENOUS
  Administered 2012-11-18 – 2012-11-20 (×4): via INTRAVENOUS

## 2012-11-16 MED ORDER — ONDANSETRON HCL 4 MG PO TABS: 4.0000 mg | ORAL_TABLET | Freq: Four times a day (QID) | ORAL | Status: DC | PRN

## 2012-11-16 MED ORDER — KETOROLAC TROMETHAMINE 15 MG/ML IJ SOLN
15.0000 mg | Freq: Four times a day (QID) | INTRAMUSCULAR | Status: AC | PRN
  Administered 2012-11-17 – 2012-11-20 (×7): 15 mg via INTRAVENOUS
  Filled 2012-11-16 (×7): qty 1

## 2012-11-16 MED ORDER — IOHEXOL 300 MG/ML  SOLN
100.0000 mL | Freq: Once | INTRAMUSCULAR | Status: AC | PRN
  Administered 2012-11-16: 80 mL via INTRAVENOUS

## 2012-11-16 MED ORDER — POLYETHYLENE GLYCOL 3350 17 G PO PACK
17.0000 g | PACK | Freq: Every day | ORAL | Status: DC
  Administered 2012-11-16 – 2012-11-19 (×4): 17 g via ORAL
  Filled 2012-11-16 (×8): qty 1

## 2012-11-16 MED ORDER — ONDANSETRON HCL 4 MG/2ML IJ SOLN: 4.0000 mg | Freq: Four times a day (QID) | INTRAMUSCULAR | Status: DC | PRN

## 2012-11-16 MED ORDER — ACETAMINOPHEN 650 MG RE SUPP: 650.0000 mg | Freq: Four times a day (QID) | RECTAL | Status: DC | PRN

## 2012-11-16 MED ORDER — MORPHINE SULFATE 2 MG/ML IJ SOLN
1.0000 mg | INTRAMUSCULAR | Status: DC | PRN
  Administered 2012-11-16 – 2012-11-17 (×2): 1 mg via INTRAVENOUS
  Filled 2012-11-16 (×2): qty 1

## 2012-11-16 MED ORDER — ENOXAPARIN SODIUM 40 MG/0.4ML ~~LOC~~ SOLN
40.0000 mg | SUBCUTANEOUS | Status: DC
  Administered 2012-11-17 (×2): 40 mg via SUBCUTANEOUS
  Filled 2012-11-16 (×3): qty 0.4

## 2012-11-16 MED ORDER — NITROGLYCERIN 0.4 MG SL SUBL: 0.4000 mg | SUBLINGUAL_TABLET | SUBLINGUAL | Status: DC | PRN

## 2012-11-16 MED ORDER — DOCUSATE SODIUM 100 MG PO CAPS
100.0000 mg | ORAL_CAPSULE | Freq: Two times a day (BID) | ORAL | Status: DC
  Administered 2012-11-16 – 2012-11-23 (×12): 100 mg via ORAL
  Filled 2012-11-16 (×16): qty 1

## 2012-11-16 MED ORDER — SODIUM CHLORIDE 0.9 % IV SOLN: INTRAVENOUS | Status: DC

## 2012-11-16 NOTE — Progress Notes (Signed)
Clinical Social Work Department BRIEF PSYCHOSOCIAL ASSESSMENT 11/16/2012  Patient:  Guy [name], Guy [name]     Account Number:  [id number]     Admit date:  11/14/2012  Clinical Social Worker:  Read Drivers  Date/Time:  11/16/2012 01:49 PM  Referred by:  Physician  Date Referred:  11/16/2012 Referred for  SNF Placement   Other Referral:   none   Interview type:  Other - See comment Other interview type:   CSW met with pt wife at bedside    PSYCHOSOCIAL DATA Living Status:  WIFE Admitted from facility:   Level of care:   Primary support name:  [name] Primary support relationship to patient:  SPOUSE Degree of support available:   Support is limited.  Wife is only support in this area. Family is in [place] and [place].    CURRENT CONCERNS Current Concerns  Post-Acute Placement   Other Concerns:   none    SOCIAL WORK ASSESSMENT / PLAN CSW met with [name], pt wife at bedside.  Teryl Lucy is upset regarding Medicare non-payment for pt SNF without 3 day inpatient qualifiying stay.  CSW explained to [facility] that if the EDP recommended pt d/c home that [facility] could possibly set up private duty nursing or pay out of pocket for SNF.  Though [name] was not happy with these options, she was agreeable to make appropriate phone calls if d/c home was the EDP's plan.  Teryl Lucy is wanting pt to be admitted to the inpatient floor in order to have pt placed in SNF.  EDP, Knapp consulted with Hospitalist, Rama in order to assess for inpatient criteria.  CSW met with MD, Rama who stated that pt will be admitted to the floor.  CSW contacted unit CSW to make aware of the referral.   Assessment/plan status:  Psychosocial Support/Ongoing Assessment of Needs Other assessment/ plan:   CSW will continue to assist with d/c plans.   Information/referral to community resources:   SNF    PATIENT'S/FAMILY'S RESPONSE TO PLAN OF CARE: Pt wife was appreciative of CSW time and effort in placement for husband, but still  very frustrated with Medicare coverage and SNF placement.  Pt wife was happy once pt was admitted to floor to address medical concerns before d/c.        Vickii Penna, LCSWA [phone]  Clinical Social Work

## 2012-11-16 NOTE — Care Management ED Note (Signed)
       CARE MANAGEMENT ED NOTE 11/14/2012  Patient:  Guy [name], Guy [name]   Account Number:  [id number]  Date Initiated:  11/14/2012  Documentation initiated by:  [facility]  Subjective/Objective Assessment:   C/O LEG PAIN.UJ:WJXBJYNW,GNFAOZHY CA.     Subjective/Objective Assessment Detail:   ELDERLY SPOUSE UNABLE TO PROVIDE CARE @ HOME.     Action/Plan:   RECEIVED CM CONS TO ASSIST W/RESOURCES.   Action/Plan Detail:   SPOKE TO PATIENT/SPOUSE ABOUT MEDICARE REGULATIONS ON 3DAY QUALIFYING INPATIENT STAY FOR SNF PLACEMENT.SPOUSE COMPREHENDED.PROVIDED W/HH,PRIVATE DUTY SITTER LIST AS RESOURCE,& PACE PROGRAM BROCHURE(LEFT IN RM).WOULD RECOMMEND HHRN/SW.   Anticipated DC Date:  11/14/2012     Status Recommendation to Physician:   Result of Recommendation:      DC Planning Services  CM consult    Choice offered to / List presented to:  C-3 Spouse          Status of service:  Completed, signed off  ED Comments:   ED Comments Detail:

## 2012-11-16 NOTE — Progress Notes (Signed)
ED CM completed at telephonic referral to [facility] Marcial Pacas) pending review of pt chart Reviewed ED visits, coverage and listed pmh

## 2012-11-16 NOTE — ED Provider Notes (Signed)
12:35Jenna, social worker states wife hasn't taken any steps as far as getting more help at home and to look into placement into facility from home  12:40 Discussed with Dr Darnelle Catalan and reviewed his labs and scans. Pt has a CT scan of his chest that is worrisome for lung cancer . She will admit to med-surg for toxic encephalopathy.         Ward Givens, MD 11/16/12 1245

## 2012-11-16 NOTE — ED Provider Notes (Signed)
Eileen Stanford, Child psychotherapist states they cannot do anything more from the ED to expedite patient's placement in a NH. The family needs to persue placement through his PCP.  He cannot be admitted without a medical issue for 3 days for placement. She is going to talk to wife about getting a sitter for the time they don't have home health coverage.   Ward Givens, MD 11/16/12 1623

## 2012-11-16 NOTE — Progress Notes (Signed)
CSW was consulted by MD, Lynelle Doctor who requested assistance with facility placement (SNF/ALF).  CSW reviewed w/e documentation from CSW, Leron Croak who documented she had spoken to ED MD and pt wife on 11/14/2012 and relayed CSW role in the ED setting.  CSW is unable to place pt from ED.  Pt does not qualify for Medicare payment for ALF/SNF (no 3-day qualifying stay and no admittable dx).  Pt would be self-pay.  Per documentation on 11/14/2012, [facility] services are maxed out for pt.  CSW will consult with ED RNCM, Selena Batten to confirm [facility] services are in place and at capacity.    CSW met with RN to discuss pt needs.  Per MD, pt is ready for d/c once CSW has relayed options to wife and pt.  RN stated that wife is on her way to the ED.  CSW requests once the wife is here that CSW be notified to meet with the wife to discuss d/c options.  RN agreeable.  Vickii Penna, LCSWA [phone]  Clinical Social Work

## 2012-11-16 NOTE — ED Notes (Signed)
Pt refused to ambulate; tried multiple times and pt refuses to stand

## 2012-11-16 NOTE — Progress Notes (Addendum)
CSW consulted with ED RNCM re: home health and community social work services set up for pt once returning home.   ED RNCM confirmed that services are at capacity.    CSW spoke to [place], pt wife, re: pt d/c plans.  Wife explained to me that pt cannot ambulate and wife has hard time at home lifting pt for restroom visits.  CSW explained (once again- Cassandra CSW explained on 02/15) the d/c home options.  D/c home options: Option 1) pt pays out of pocket for SNF - [name] stated she cannot afford this option, though when CSW asked if she had called a SNF, [name] responded, "no".  [name] stated that her daughter is a resident of [place], [place] and she would check on their financial demands and weigh the option.  Option 2) pt/wife pays for private sitter (list given to pt wife by RN CM earlier) for the hours during the day the wife feels she needs assistance with pt.  CSW asked wife if she had called a private sitter to check on the fees associated with this.  [name]'s response was, "no".  CSW encouraged Teryl Lucy to begin to make a Plan 'B' by calling the SNF of her choice and a private duty sitter so once the EDP assessed her husband again (per [name]'s request due to pt not being able to ambulate), she would be comfortable with pt going home and have appropriate/safe plans in place.  Teryl Lucy was agreeable, but did not want to start on this plan until the EDP assess pt for inpatient criteria.  CSW will consult with Teryl Lucy again once EDP has assessed.  CSW consulted with EDP, Knapp.  EDP stated she has consulted with hospitalist to assess for inpatient criteria.  CSW will assist as necessary.  Vickii Penna, LCSWA [phone]  Clinical Social Work

## 2012-11-16 NOTE — Progress Notes (Signed)
[facility] ED CM note pt return to [facility] ED when reviewing charts. Pt seen by ED CM x 2. Wife seen X 1 Last seen on 11/13/12 Pt and wife have been set up with [facility] for PT, OT, RN, NA and SW and private duty information provided to wife on 11/13/12. Refer to notes on 11/11/12 & 11/13/12. Pt has been provided with all home health services available at this time. CM left a voice message for Darl Pikes of [facility] to find out the progress of services for pt.  Pending a return call

## 2012-11-16 NOTE — Progress Notes (Signed)
CM called Dr Altheimer's office [address], [place], [place] [address] [phone] and left a voice message for Marylu Lund, Altheimer's medical assistant in regards to pt 3 ED visits, services offered and referral pending Requested a return call to CM Left cm available number

## 2012-11-16 NOTE — Progress Notes (Signed)
Report rec'd from ED RN Cortney.  Pt transferring to Rm 1301 by bed from ED.

## 2012-11-16 NOTE — Progress Notes (Signed)
CM left another voice message for Marylu Lund, Dr Altheimer's assistant, to inform her of Dr Darnelle Catalan pending evaluation of pt

## 2012-11-16 NOTE — Progress Notes (Signed)
Late entry for 1500 11/16/12 ED CM spoke with [facility] coordinator, Tim Updated on pt admission to [facility]. Tim will check pt chart on [address] [place]

## 2012-11-16 NOTE — Progress Notes (Signed)
CARE MANAGEMENT ED NOTE 11/16/2012  Patient:  Guy [name], Guy [name]   Account Number:  [id number]  Date Initiated:  11/14/2012  Documentation initiated by:  [facility]  Subjective/Objective Assessment:   C/O LEG PAIN.WU:JWJXBJYN,WGNFAOZH CA.     Subjective/Objective Assessment Detail:   ELDERLY SPOUSE UNABLE TO PROVIDE CARE @ HOME.     Action/Plan:   RECEIVED CM CONS TO ASSIST W/RESOURCES.   Action/Plan Detail:   SPOKE TO PATIENT/SPOUSE ABOUT MEDICARE REGULATIONS ON 3DAY QUALIFYING INPATIENT STAY FOR SNF PLACEMENT.SPOUSE COMPREHENDED.PROVIDED W/HH,PRIVATE DUTY SITTER LIST AS RESOURCE,& PACE PROGRAM BROCHURE(LEFT IN RM).WOULD RECOMMEND HHRN/SW.   Anticipated DC Date:  11/14/2012     Status Recommendation to Physician:   Result of Recommendation:      DC Planning Services  CM consult    Choice offered to / List presented to:  C-3 Spouse          Status of service:  Completed, signed off  ED Comments:   ED Comments Detail:

## 2012-11-16 NOTE — ED Provider Notes (Signed)
Pt waiting for placement. Pt is pleasant, getting shaved, he has no complaints today.   Reading social works note they are unable to expedite placement in ALF at this time. Pt was kept in ED b/o his inability to walk.  Will need to talk to social work today to see where we are in the process.  Devoria Albe, MD, FACEP   Ward Givens, MD 11/16/12 [phone]

## 2012-11-16 NOTE — Care Management Note (Unsigned)
    Page 1 of 1   11/16/2012     5:14:18 PM   CARE MANAGEMENT NOTE 11/16/2012  Patient:  Guy [name], Guy [name]   Account Number:  [id number]  Date Initiated:  11/16/2012  Documentation initiated by:  Lanier Clam  Subjective/Objective Assessment:   ADMITTED W/L LEG PAIN.ZO:XWRUEA.SEVERAL ED VISITS.     Action/Plan:   FROM HOME W/SPOUSE.HAS PCP,PHARMACY.   Anticipated DC Date:  11/19/2012   Anticipated DC Plan:  SKILLED NURSING FACILITY      DC Planning Services  CM consult      Choice offered to / List presented to:             Status of service:  In process, will continue to follow Medicare Important Message given?   (If response is "NO", the following Medicare IM given date fields will be blank) Date Medicare IM given:   Date Additional Medicare IM given:    Discharge Disposition:    Per UR Regulation:  Reviewed for med. necessity/level of care/duration of stay  If discussed at Long Length of Stay Meetings, dates discussed:    Comments:  11/16/12 [facility] Kateryna Grantham RN,BSN NCM [phone] SEVERAL ED VISITS.ALREADY PROVIDED W/HHC,PVT SITTER RESOURCES.AWAIT PT/OT RECOMMENDATIONS.

## 2012-11-16 NOTE — Progress Notes (Signed)
ED CM consulted with ED SW to inform her CM has provided all services available to pt at this time

## 2012-11-16 NOTE — H&P (Signed)
[facility] History and Physical  Guy [name] YNW:[id number] DOB: 01/15/1920 DOA: 11/14/2012  Referring physician: Dr. Devoria Albe PCP: Junious Silk, MD   Chief Complaint: Left hip pain with gait instability   History of Present Illness: Guy [name] is an 79 y.o. male with a PMH of metastatic prostate cancer, on Lupron injections, last injection 10/29/12 who developed left sided hip pain after his injection.  He was evaluated as an outpatient with an MRI of the thoracic and lumbar spine on 11/08/2012 (positive for a remote T4 vertebral body compression deformity, and posterior disc bulges and facet hypertrophy resulting in right greater than left foraminal narrowing at L3-L4, L4-L5, and L5-L6, advanced on the right at L4-L5 and L5-L6) and subsequently was diagnosed with severe sciatica.  He was prescribed pain medications with no relief.  The patient's wife brought him to the ER on 11/11/2012 and again on 11/13/2012 for evaluation as she cannot manage him at home. He is unable to ambulate secondary to left hip pain. He has fallen at home multiple times.  His most recent PSA was elevated at 39.86.  He was then placed on Casodex on 11/06/12.  The patient was unable to ambulate without 2+ assist per nursing staff, and he was felt to be unsafe to return home.  Additionally, he is having intractable hip pain and the pain medication has now made him confused and restless.  Patient's wife also reports that he occasionally has nightmares and hallucinations.  Review of Systems: Constitutional: No fever, no chills;  Appetite normal; + weight loss, no weight gain.  HEENT: No blurry vision, no diplopia, no pharyngitis, no dysphagia CV: No chest pain, no palpitations.  Resp: + SOB, no cough. GI: No nausea, no vomiting, no diarrhea, no melena, no hematochezia.  GU: No dysuria, no hematuria.  MSK: Left hip pain, diffuse myalgias and arthralgias.  Neuro:  No headache, no focal neurological deficits, no  history of seizures.  Psych: No depression, no anxiety.  Endo: No thyroid disease, + DM, no heat intolerance, no cold intolerance, no polyuria, no polydipsia  Skin: No rashes, no skin lesions.  Heme: No easy bruising, no history of blood diseases.  Past Medical History Past Medical History  Diagnosis Date  . Hypertension   . Diabetes mellitus without complication   . Arthritis   . Sciatica   . GERD (gastroesophageal reflux disease)   . Bladder cancer   . Urge incontinence   . Prostate cancer     S/P prostatectomy; Lung metastasis  . Vertigo   . Hyperlipidemia   . ED (erectile dysfunction)   . Gastric polyposis   . Osteoporosis   . Decreased libido   . Depression   . Hepatitis A   . CAD (coronary artery disease)   . Stroke   . History of blood transfusion     1946  . OA (osteoarthritis)     Past Surgical History Past Surgical History  Procedure Laterality Date  . Coronary angioplasty with stent placement  1996  . Carpal tunnel release    . Cataract extraction w/ intraocular lens  implant, bilateral  1998  . Prostatectomy    . Urinary sphincter implant    . Urinary sphincter implant revision    . Appendectomy    . Left hip surgery      Donated bone for bone graft to arm  . Penile prosthesis implant      S/P removal and re-implantation of new prosthesis  . Middle  ear surgery    . Finger surgery      Left and right  . Hernia repair    . Bladder surgery    . Right arm bone graft      Pathological fracture      Social History: History   Social History  . Marital Status: Married    Spouse Name: [name]    Number of Children: 3  . Years of Education: N/A   Occupational History  . Business owner, Airline pilot, retired    Social History Main Topics  . Smoking status: Former Games developer  . Smokeless tobacco: Never Used  . Alcohol Use: No  . Drug Use: No  . Sexually Active: No   Other Topics Concern  . Not on file   Social History Narrative   Married.  Lives with  his wife.  Ambulates with a walker and a cane.    Family History:  Family History  Problem Relation Age of Onset  . Heart failure Mother   . Bladder Cancer Father   . Pancreatic cancer Sister   . Lung cancer Sister   . Breast cancer Daughter   . Liver disease Son     Allergies: Albuterol sulfate hfa; Adhesive; Penicillins; and Sulfa drugs cross reactors  Meds: Prior to Admission medications   Medication Sig Start Date End Date Taking? Authorizing Provider  acetaminophen (TYLENOL) 325 MG tablet Take 325 mg by mouth daily.    Yes Historical Provider, MD  aspirin EC 81 MG tablet Take 81 mg by mouth every evening.    Yes Historical Provider, MD  bicalutamide (CASODEX) 50 MG tablet Take 50 mg by mouth daily.   Yes Historical Provider, MD  Calcium Carbonate-Vitamin D (CALCIUM 600+D HIGH POTENCY) 600-400 MG-UNIT per tablet Take 1 tablet by mouth 2 (two) times daily.     Yes Historical Provider, MD  cycloSPORINE (RESTASIS) 0.05 % ophthalmic emulsion Place 1 drop into both eyes 2 (two) times daily.     Yes Historical Provider, MD  docusate sodium (COLACE) 100 MG capsule Take 100 mg by mouth 2 (two) times daily.    Yes Historical Provider, MD  docusate sodium (COLACE) 250 MG capsule Take 1 capsule (250 mg total) by mouth daily. 11/13/12  Yes Ethelda Chick, MD  fluocinonide (LIDEX) 0.05 % cream Apply 1 application topically 2 (two) times daily.     Yes Historical Provider, MD  gabapentin (NEURONTIN) 100 MG capsule Take 100 mg by mouth 3 (three) times daily.    Yes Historical Provider, MD  glimepiride (AMARYL) 4 MG tablet Take 4 mg by mouth daily before breakfast.     Yes Historical Provider, MD  insulin glargine (LANTUS) 100 UNIT/ML injection Inject 16 Units into the skin at bedtime.    Yes Historical Provider, MD  Leuprolide Acetate (LUPRON IJ) Inject 1 each as directed as directed. He [facility] at [facility].   Yes Historical Provider, MD  loratadine (CLARITIN)  10 MG tablet Take 10 mg by mouth daily as needed for allergies.    Yes Historical Provider, MD  losartan (COZAAR) 50 MG tablet Take 50 mg by mouth every morning.     Yes Historical Provider, MD  meclizine (ANTIVERT) 25 MG tablet Take 25 mg by mouth 3 (three) times daily as needed for dizziness.    Yes Historical Provider, MD  metoprolol (LOPRESSOR) 50 MG tablet Take 25 mg by mouth 2 (two) times daily.     Yes Historical Provider,  MD  Multiple Vitamins-Minerals (MULTIVITAMINS THER. W/MINERALS) TABS Take 1 tablet by mouth every morning.    Yes Historical Provider, MD  nitroGLYCERIN (NITROSTAT) 0.4 MG SL tablet Place 0.4 mg under the tongue every 5 (five) minutes as needed for chest pain.    Yes Historical Provider, MD  omeprazole (PRILOSEC) 20 MG capsule Take 20 mg by mouth 2 (two) times daily.     Yes Historical Provider, MD  oxybutynin (DITROPAN) 5 MG tablet Take 5 mg by mouth at bedtime.   Yes Historical Provider, MD  oxyCODONE-acetaminophen (PERCOCET) 10-325 MG per tablet Take 1 tablet by mouth every 4 (four) hours as needed for pain. 11/11/12  Yes Amber Nydia Bouton, MD  polyethylene glycol (MIRALAX / GLYCOLAX) packet Take 17 g by mouth daily. 11/11/12  Yes Amber Nydia Bouton, MD  sertraline (ZOLOFT) 100 MG tablet Take 200 mg by mouth at bedtime.    Yes Historical Provider, MD  simvastatin (ZOCOR) 80 MG tablet Take 40 mg by mouth at bedtime.    Yes Historical Provider, MD  traMADol (ULTRAM) 50 MG tablet Take 50 mg by mouth every 8 (eight) hours as needed for pain.    Yes Historical Provider, MD    Physical Exam: Filed Vitals:   11/15/12 2331 11/16/12 0317 11/16/12 1217 11/16/12 1345  BP:  135/52 135/79 152/55  Pulse: 55 65 61 56  Temp: 97.7 F (36.5 C) 97.9 F (36.6 C) 97.5 F (36.4 C) 98.8 F (37.1 C)  TempSrc: Oral Oral Oral Oral  Resp: 16 20 20 18   SpO2: 96% 94% 96% 96%     Physical Exam: Blood pressure 152/55, pulse 56, temperature 98.8 F (37.1 C), temperature source Oral, resp.  rate 18, SpO2 96.00%. Gen: No acute distress.  Restless at times, appears to be responding to internal stimuli (grasping at objects in the air). Head: Normocephalic, atraumatic. Eyes: PERRL, EOMI, sclerae nonicteric. Mouth: Oropharynx clear.  Edentulous.  Mucous membranes moist. Neck: Supple, no thyromegaly, no lymphadenopathy, no jugular venous distention. Chest: Lungs CTAB, diminished at the bases. CV: Heart sounds regular, no M/R/G. Abdomen: Soft, nontender, nondistended with normal active bowel sounds. Extremities: Extremities without clubbing, edema or cyanosis Skin: Warm and dry. Neuro: Alert and oriented times 3 but having periods of confusion and responding to internal stimuli; cranial nerves II through XII grossly intact. Psych: Mood and affect anxious at times.  Labs on Admission:  Basic Metabolic Panel:  Recent Labs Lab 11/13/12 1534 11/14/12 1040  NA 133* 133*  K 3.7 4.1  CL 96 95*  CO2 27 30  GLUCOSE 118* 194*  BUN 20 27*  CREATININE 1.03 1.12  CALCIUM 9.4 9.7   CBC:  Recent Labs Lab 11/13/12 1534 11/14/12 1040  WBC 8.6 12.0*  NEUTROABS  --  10.3*  HGB 12.8* 12.5*  HCT 37.7* 36.5*  MCV 95.2 96.3  PLT 162 183   CBG:  Recent Labs Lab 11/15/12 1147 11/15/12 1651 11/15/12 2108 11/16/12 0822 11/16/12 1220  GLUCAP 139* 90 137* 107* 148*   Urinalysis    Component Value Date/Time   COLORURINE YELLOW 11/14/2012 1217   APPEARANCEUR CLEAR 11/14/2012 1217   LABSPEC 1.020 11/14/2012 1217   PHURINE 5.5 11/14/2012 1217   GLUCOSEU NEGATIVE 11/14/2012 1217   HGBUR NEGATIVE 11/14/2012 1217   BILIRUBINUR NEGATIVE 11/14/2012 1217   KETONESUR TRACE* 11/14/2012 1217   PROTEINUR NEGATIVE 11/14/2012 1217   UROBILINOGEN 0.2 11/14/2012 1217   NITRITE NEGATIVE 11/14/2012 1217   LEUKOCYTESUR NEGATIVE 11/14/2012 1217  Radiological Exams on Admission: No results found.  Assessment/Plan Principal Problem:   Encephalopathy, toxic secondary to pain medication -Patient  appears to have an acute encephalopathy that is likely from pain medications although brain metastasis given his metastatic prostate cancer also in the differential. -Difficult problem in that the patient does have intractable pain that we must treat however he is fair he sensitive to pain medications. We can cautiously try nonsteroidal anti-inflammatories with close monitoring of his renal function. Active Problems:   Intractable pain, left hip -This could be sciatica but given his history of metastatic prostate cancer, worrisome for bone involvement. He has had recent outpatient MRI scans of his lumbar and thoracic spine which did not show any evidence of metastatic deposits. -We'll do a nuclear medicine bone scan to see if there is any evidence of metastatic bone disease involving his pelvic girdle. Would defer a PET scan for now, which can be done as an outpatient. -We'll try to manage pain with nonsteroidal anti-inflammatories and low-dose narcotics.   Gait instability -Physical and occupational therapy evaluations will be requested. -Patient is currently a 2+ assist and is unsafe to return home with his frail, elderly wife.   Hyponatremia -Maybe secondary to dehydration. We'll gently hydrate.   Prostate cancer metastatic to multiple sites -Patient appears to have pulmonary metastasis and a rising PSA. -Will get bone scan. -Continue Casodex.   DM (diabetes mellitus) -Continue Amaryl, 16 units of Lantus each bedtime, and moderate scale SSI.   Dehydration -BUN to creatinine ratio elevated over usual baseline values. Likely secondary to poor by mouth intake. Hydrate and monitor.   Leukocytosis, unspecified -No obvious infectious etiology. UA done on 11/14/2012 negative for nitrites and leukocytes.  CXR done 11/14/12 negative for PNA.   Normocytic anemia -Likely anemia of chronic disease. Check B12 level.   Dyspnea -Likely from an enlarging pulmonary metastasis. Supplemental oxygen as needed  for oxygen saturation less than 92%.   Ascites -Will get a diagnostic and therapeutic paracentesis with studies.  Code Status: DNR Family Communication: [name] [phone], cell [phone]. Disposition Plan: Probably SNF.  Time spent: One hour.  Devanee Pomplun [facility] Pager [phone]  If 7PM-7AM, please contact night-coverage [email or web address] Password [facility] 11/16/2012, 3:03 PM

## 2012-11-16 NOTE — ED Notes (Signed)
Report called to [facility] given to [place], Charity fundraiser; pt stable at time of transport

## 2012-11-17 ENCOUNTER — Inpatient Hospital Stay (HOSPITAL_COMMUNITY): Payer: Medicare Other

## 2012-11-17 LAB — CBC
HCT: 30.9 % — ABNORMAL LOW (ref 39.0–52.0)
Hemoglobin: 11.1 g/dL — ABNORMAL LOW (ref 13.0–17.0)
MCH: 34.2 pg — ABNORMAL HIGH (ref 26.0–34.0)
MCV: 95.1 fL (ref 78.0–100.0)
Platelets: 166 10*3/uL (ref 150–400)
RBC: 3.25 MIL/uL — ABNORMAL LOW (ref 4.22–5.81)
WBC: 11 10*3/uL — ABNORMAL HIGH (ref 4.0–10.5)

## 2012-11-17 LAB — COMPREHENSIVE METABOLIC PANEL
AST: 40 U/L — ABNORMAL HIGH (ref 0–37)
BUN: 26 mg/dL — ABNORMAL HIGH (ref 6–23)
CO2: 28 mEq/L (ref 19–32)
Calcium: 8.9 mg/dL (ref 8.4–10.5)
Chloride: 95 mEq/L — ABNORMAL LOW (ref 96–112)
Creatinine, Ser: 1.18 mg/dL (ref 0.50–1.35)
GFR calc Af Amer: 61 mL/min — ABNORMAL LOW (ref >90)
GFR calc non Af Amer: 52 mL/min — ABNORMAL LOW (ref >90)
Glucose, Bld: 109 mg/dL — ABNORMAL HIGH (ref 70–99)
Total Bilirubin: 0.5 mg/dL (ref 0.3–1.2)

## 2012-11-17 LAB — GLUCOSE, CAPILLARY: Glucose-Capillary: 77 mg/dL (ref 70–99)

## 2012-11-17 LAB — VITAMIN B12: Vitamin B-12: 840 pg/mL (ref 211–911)

## 2012-11-17 LAB — AMMONIA: Ammonia: 33 umol/L (ref 11–60)

## 2012-11-17 MED ORDER — TECHNETIUM TC 99M MEDRONATE IV KIT
25.0000 | PACK | Freq: Once | INTRAVENOUS | Status: AC | PRN
  Administered 2012-11-17: 25 via INTRAVENOUS

## 2012-11-17 MED ORDER — FLEET ENEMA 7-19 GM/118ML RE ENEM
1.0000 | ENEMA | Freq: Once | RECTAL | Status: AC
  Administered 2012-11-17: 1 via RECTAL
  Filled 2012-11-17: qty 1

## 2012-11-17 MED ORDER — PREDNISONE 50 MG PO TABS
50.0000 mg | ORAL_TABLET | Freq: Every day | ORAL | Status: DC
  Administered 2012-11-17 – 2012-11-22 (×6): 50 mg via ORAL
  Filled 2012-11-17 (×8): qty 1

## 2012-11-17 NOTE — Consult Note (Addendum)
Reason for Consult:L Hip pain Referring Physician: Dr. Rodney Cruise Guy [name] is an 79 y.o. male.  HPI: Patient is admitted to the medicine service for intractable low back and left hip pain. He is treated for metastatic prostate cancer and had a Lupron injection Ativan of January and has had hip pain ever since then. The pain affects his ability to walk wakes him up at night but it does come and go. He denies any recent or distant falls. The pain limits his ability to bear weight on his left lower extremity. He does report that the pain radiates from the back down the leg but does not feel that the pain is electrical. He is treated for metastatic prostate [facility] at [facility]. The pain became considerably worse a few weeks ago after a Lupron injection. [facility]up at [facility] included MRI scans of the thoracic and lumbar spine. These studies were done on 11/08/2012. The MRI scan of the lumbar spine showed some moderate to advanced foraminal stenosis on the left at L4-5 and the right. The findings are as follows:  Degenerative disc disease:. . T12-L1: No significant focal abnormality. . L1-L2: No significant focal abnormality. . L2-L3: No significant focal abnormality. [name] L3-L4: Small posterior disc bulge and facet hypertrophy results in mild left foraminal narrowing. [name] L4-L5: Posterior disc bulge present with moderate to advanced left foraminal stenosis and advanced right foraminal stenosis. Narrowing of the lateral recesses. Small amount of fluid within the facet joints. [name] L5-L6: Posterior disc bulge present with mild left foraminal stenosis and advanced right foraminal stenosis. Narrowing of the lateral recesses. Small amount of fluid within the facet joints. . L6-S1: No significant focal abnormality. [name] Upper Sacrum/Ilium: No significant focal abnormality. After consultationthis morning we did obtain plain radiographs of the left hip AP and lateral that were unremarkable for any  evidence of fracture, or significant arthritis. In addition a bone scan was accomplished the did not show any evidence of metastatic lesion in the pelvis or hip area.     Past Medical History  Diagnosis Date  . Hypertension   . Diabetes mellitus without complication   . Arthritis   . Sciatica   . GERD (gastroesophageal reflux disease)   . Bladder cancer   . Urge incontinence   . Prostate cancer     S/P prostatectomy; Lung metastasis  . Vertigo   . Hyperlipidemia   . ED (erectile dysfunction)   . Gastric polyposis   . Osteoporosis   . Decreased libido   . Depression   . Hepatitis A   . CAD (coronary artery disease)   . Stroke   . History of blood transfusion     1946  . OA (osteoarthritis)     Past Surgical History  Procedure Laterality Date  . Coronary angioplasty with stent placement  1996  . Carpal tunnel release    . Cataract extraction w/ intraocular lens  implant, bilateral  1998  . Prostatectomy    . Urinary sphincter implant    . Urinary sphincter implant revision    . Appendectomy    . Left hip surgery      Donated bone for bone graft to arm  . Penile prosthesis implant      S/P removal and re-implantation of new prosthesis  . Middle ear surgery    . Finger surgery      Left and right  . Hernia repair    . Bladder surgery    .  Right arm bone graft      Pathological fracture    Family History  Problem Relation Age of Onset  . Heart failure Mother   . Bladder Cancer Father   . Pancreatic cancer Sister   . Lung cancer Sister   . Breast cancer Daughter   . Liver disease Son     Social History:  reports that he has quit smoking. He has never used smokeless tobacco. He reports that he does not drink alcohol or use illicit drugs.  Allergies:  Allergies  Allergen Reactions  . Albuterol Sulfate Hfa (WUJ:[id number]) Shortness Of Breath  . Adhesive (Tape) Other (See Comments)    REACTION: SKIN BLISTERS  . Penicillins Other (See Comments)    REACTION:  ITCHING HANDS  . Sulfa Drugs Cross Reactors Hives    Medications: I have reviewed the patient's current medications.  Results for orders placed during the hospital encounter of 11/14/12 (from the past 48 hour(s))  GLUCOSE, CAPILLARY     Status: Abnormal   Collection Time    11/15/12  9:08 PM      Result Value Range   Glucose-Capillary 137 (*) 70 - 99 mg/dL  GLUCOSE, CAPILLARY     Status: Abnormal   Collection Time    11/16/12  8:22 AM      Result Value Range   Glucose-Capillary 107 (*) 70 - 99 mg/dL   Comment 1 Notify RN    GLUCOSE, CAPILLARY     Status: Abnormal   Collection Time    11/16/12 12:20 PM      Result Value Range   Glucose-Capillary 148 (*) 70 - 99 mg/dL   Comment 1 Notify RN    CBC     Status: Abnormal   Collection Time    11/16/12  3:35 PM      Result Value Range   WBC 14.2 (*) 4.0 - 10.5 K/uL   RBC 3.59 (*) 4.22 - 5.81 MIL/uL   Hemoglobin 11.7 (*) 13.0 - 17.0 g/dL   HCT 95.6 (*) 21.3 - 08.6 %   MCV 95.8  78.0 - 100.0 fL   MCH 32.6  26.0 - 34.0 pg   MCHC 34.0  30.0 - 36.0 g/dL   RDW 57.8  46.9 - 62.9 %   Platelets 206  150 - 400 K/uL  CREATININE, SERUM     Status: Abnormal   Collection Time    11/16/12  3:35 PM      Result Value Range   Creatinine, Ser 1.31  0.50 - 1.35 mg/dL   GFR calc non Af Amer 46 (*) >90 mL/min   GFR calc Af Amer 54 (*) >90 mL/min   Comment:            The eGFR has been calculated     using the CKD EPI equation.     This calculation has not been     validated in all clinical     situations.     eGFR's persistently     <90 mL/min signify     possible Chronic Kidney Disease.  GLUCOSE, CAPILLARY     Status: Abnormal   Collection Time    11/16/12  5:15 PM      Result Value Range   Glucose-Capillary 132 (*) 70 - 99 mg/dL   Comment 1 Documented in Chart     Comment 2 Notify RN    GLUCOSE, CAPILLARY     Status: Abnormal   Collection Time    11/16/12  7:58 PM      Result Value Range   Glucose-Capillary 132 (*) 70 - 99 mg/dL    Comment 1 Notify RN    CBC     Status: Abnormal   Collection Time    11/17/12  4:00 AM      Result Value Range   WBC 11.0 (*) 4.0 - 10.5 K/uL   RBC 3.25 (*) 4.22 - 5.81 MIL/uL   Hemoglobin 11.1 (*) 13.0 - 17.0 g/dL   HCT 14.7 (*) 82.9 - 56.2 %   MCV 95.1  78.0 - 100.0 fL   MCH 34.2 (*) 26.0 - 34.0 pg   MCHC 35.9  30.0 - 36.0 g/dL   RDW 13.0  86.5 - 78.4 %   Platelets 166  150 - 400 K/uL  COMPREHENSIVE METABOLIC PANEL     Status: Abnormal   Collection Time    11/17/12  4:00 AM      Result Value Range   Sodium 132 (*) 135 - 145 mEq/L   Potassium 3.8  3.5 - 5.1 mEq/L   Chloride 95 (*) 96 - 112 mEq/L   CO2 28  19 - 32 mEq/L   Glucose, Bld 109 (*) 70 - 99 mg/dL   BUN 26 (*) 6 - 23 mg/dL   Creatinine, Ser 6.96  0.50 - 1.35 mg/dL   Calcium 8.9  8.4 - 29.5 mg/dL   Total Protein 6.3  6.0 - 8.3 g/dL   Albumin 2.5 (*) 3.5 - 5.2 g/dL   AST 40 (*) 0 - 37 U/L   ALT 45  0 - 53 U/L   Alkaline Phosphatase 208 (*) 39 - 117 U/L   Total Bilirubin 0.5  0.3 - 1.2 mg/dL   GFR calc non Af Amer 52 (*) >90 mL/min   GFR calc Af Amer 61 (*) >90 mL/min   Comment:            The eGFR has been calculated     using the CKD EPI equation.     This calculation has not been     validated in all clinical     situations.     eGFR's persistently     <90 mL/min signify     possible Chronic Kidney Disease.  VITAMIN B12     Status: None   Collection Time    11/17/12  4:00 AM      Result Value Range   Vitamin B-12 840  211 - 911 pg/mL  AMMONIA     Status: None   Collection Time    11/17/12  4:08 AM      Result Value Range   Ammonia 33  11 - 60 umol/L  GLUCOSE, CAPILLARY     Status: None   Collection Time    11/17/12  7:54 AM      Result Value Range   Glucose-Capillary 77  70 - 99 mg/dL   Comment 1 Documented in Chart     Comment 2 Notify RN    GLUCOSE, CAPILLARY     Status: Abnormal   Collection Time    11/17/12 12:32 PM      Result Value Range   Glucose-Capillary 110 (*) 70 - 99 mg/dL    Comment 1 Documented in Chart     Comment 2 Notify RN    GLUCOSE, CAPILLARY     Status: Abnormal   Collection Time    11/17/12  5:17 PM      Result Value Range  Glucose-Capillary 184 (*) 70 - 99 mg/dL   Comment 1 Notify RN      Dg Pelvis 1-2 Views  11/17/2012  *RADIOLOGY REPORT*  Clinical Data: Left hip pain.  10/04/2011  PELVIS - 1-2 VIEW  Comparison: CT 06/01/2008.  Dates for body bone scan.  Findings: Deformity noted in the left iliac crest at the anterior superior iliac spine.  This likely is related to old injury or enthesopathic changes and is unchanged since 2009.  Slight irregularity noted at the tip of the greater trochanter, likely enthesopathic changes.  No fracture.  Mild diffuse osteopenia. Since the surgical clips again noted in the pelvis.  IMPRESSION: No acute bony abnormality.  No fracture.  Chronic changes as above.   Original Report Authenticated By: Charlett Nose, M.D.    Dg Hip 1 View Left  11/17/2012  *RADIOLOGY REPORT*  Clinical Data: Left hip pain with no known injury  LEFT HIP - 1 VIEW:  Comparison: 06/14/2010  Findings: Overall assessment of the hip is compromised by the single lateral view.  The hip joint appears mildly narrowed and this is stable.  Deformity of the inferior aspect of the left iliac wing is again noted.  Bone density is stable in comparison with the prior exam and no definite acute abnormality is seen within the femoral head or shaft.  Evaluation of the intertrochanteric zone is compromised by position resulting in bony overlap of the greater trochanter and femoral neck.  IMPRESSION: Stable mild degenerative change of the hip joint. No obvious acute bony abnormality seen with compromised evaluation of the intertrochanteric zone.   Original Report Authenticated By: Rhodia Albright, M.D.    Ct Head W Wo Contrast  11/16/2012  *RADIOLOGY REPORT*  Clinical Data: Prostate carcinoma, leg pain  CT HEAD WITHOUT AND WITH CONTRAST  Technique:  Contiguous axial images  were obtained from the base of the skull through the vertex without and with intravenous contrast.  Contrast: 80mL OMNIPAQUE IOHEXOL 300 MG/ML  SOLN  Comparison: 06/14/2010  Findings: Atherosclerotic and physiologic intracranial calcifications.  Mucoperiosteal thickening in the right maxillary sinus.  Stable lacunar infarct in the left basal ganglia. Diffuse parenchymal atrophy. Patchy areas of hypoattenuation in deep and periventricular white matter bilaterally. Negative for acute intracranial hemorrhage, mass lesion, acute infarction, midline shift, or mass-effect.  No   unexpected enhancement after IV contrast administration.  Ventricles and sulci symmetric. Bone windows demonstrate no focal lesion.  IMPRESSION:  1. Negative for bleed, metastatic disease, or other acute intracranial process.  2. Atrophy and nonspecific white matter changes. 3.  Right maxillary sinus disease.   Original Report Authenticated By: D. Andria Rhein, MD    Nm Bone Scan Whole Body  11/17/2012  *RADIOLOGY REPORT*  Clinical Data: Intractable left hip pain.  NUCLEAR MEDICINE WHOLE BODY BONE SCINTIGRAPHY  Technique:  Whole body anterior and posterior images were obtained approximately 3 hours after intravenous injection of radiopharmaceutical.  Radiopharmaceutical: CURIE TC-MDP TECHNETIUM TC 25M MEDRONATE IV KIT  Comparison: On plain films 11/17/2012  Findings: There is rightward scoliosis in the lumbar spine. Increased activity noted in the lower lumbar spine, likely the L3 level.  Cannot exclude compression fracture.  Recommend further evaluation with lumbar spine series.  Minimal/subtle increased activity at the tip of the left greater trochanter of unknown etiology or significance.  No other areas of abnormal bony uptake.  IMPRESSION: Increased activity within the mid to lower lumbar spine, likely L3. Cannot exclude compression fracture.  Recommend further evaluation with lumbar  spine series.  Subtle focal increased activity at  the tip of the left greater trochanter.  Given the location and appearance on the plain films, this could be related to enthesopathic changes.   Original Report Authenticated By: Charlett Nose, M.D.     ROS the patient denies any shortness of breath denies any chest pain Blood pressure 168/70, pulse 64, temperature 99.4 F (37.4 C), temperature source Oral, resp. rate 18, SpO2 96.00%. Physical Exam patient is tender along the paralumbar muscles, straight leg raising reproduces low back pain on the left at 60 on the right at 80, the patient denies any radicular component. Internal and external rotation of the left hip reproduces minimal pain. Foot tap reproduces low back pain on the left side.  Assessment/Plan: Low back and left hip pain without any evidence of fracture or metastatic lesions going into the hip. The patient does have moderate to advanced foraminal stenosis at L4-5. As a diagnostic and therapeutic maneuver it may be reasonable to have radiology do epidural steroid injections at that level to see if it relates his pain. In addition physical therapy for stretching and strengthening may also be reasonable. If the patient is not amenable to epidural steroid injections pain management is the most reasonable option. There is no indication for any surgical intervention at this time.  Guy [name] 11/17/2012, 5:31 PM

## 2012-11-17 NOTE — Plan of Care (Signed)
Problem: Phase I Progression Outcomes Goal: Pain controlled with appropriate interventions Outcome: Not Met (add Reason) Even with prn toradol and morphine patient still c/o pain with moving left leg.

## 2012-11-17 NOTE — Progress Notes (Addendum)
Clinical Social Work Department CLINICAL SOCIAL WORK PLACEMENT NOTE 11/17/2012  Patient:  Guy [name], Guy [name]  Account Number:  [id number] Admit date:  11/14/2012  Clinical Social Worker:  Jacelyn Grip  Date/time:  11/17/2012 12:00 N  Clinical Social Work is seeking post-discharge placement for this patient at the following level of care:   SKILLED NURSING   (*CSW will update this form in Epic as items are completed)   11/17/2012  Patient/family provided with [facility] Department of Clinical Social Work's list of facilities offering this level of care within the geographic area requested by the patient (or if unable, by the patient's family).  11/17/2012  Patient/family informed of their freedom to choose among providers that offer the needed level of care, that participate in Medicare, Medicaid or managed care program needed by the patient, have an available bed and are willing to accept the patient.  11/17/2012  Patient/family informed of [facility]' ownership interest in [facility], as well as of the fact that they are under no obligation to receive care at this facility.  PASARR submitted to EDS on 11/17/2012 PASARR number received from EDS on 11/18/2012  FL2 transmitted to all facilities in geographic area requested by pt/family on  11/17/2012 FL2 transmitted to all facilities within larger geographic area on   Patient informed that his/her managed care company has contracts with or will negotiate with  certain facilities, including the following:     Patient/family informed of bed offers received:  11/18/2012 Patient chooses bed at [facility] Physician recommends and patient chooses bed at    Patient to be transferred to  on  [facility] on 11/23/2012 Patient to be transferred to facility by ambulance Sharin Mons)  The following physician request were entered in Epic:   Additional Comments: Pt wife preference is [facility].    Jacklynn Lewis, MSW, LCSWA  Clinical Social Work [phone]

## 2012-11-17 NOTE — Progress Notes (Addendum)
CSW attempted to follow up with pt spouse re: SNF placement.  Pt currently having xrays completed and pt spouse not at bedside.  CSW contacted pt wife via telephone and left voice message.  CSW to continue to follow to assist with SNF placement. Per RNCM, pt meets inpatient criteria.  Addendum 12:02pm:   Pt spouse arrived to pt room.  CSW met with pt spouse to discuss SNF placement.  Pt wife was relieved to learn that pt meets Medicare Inpatient Criteria for SNF placement.  Pt wife agreeable to SNF search to [facility].  Pt wife reports that her daughter is a resident at facility and she would like pt at facility in order to visit both family members.  CSW completed FL2 and initiated SNF search to [facility]. CSW left voice message for [facility] admission coordinator.  CSW to follow up with pt spouse in regard to if [facility] is able to offer pt a bed.  CSW to continue to follow and facilitate pt discharge needs when pt medically stable for discharge.   Jacklynn Lewis, MSW, LCSWA  Clinical Social Work [phone]

## 2012-11-17 NOTE — Evaluation (Signed)
Occupational Therapy Evaluation Patient Details Name: Guy [name] MRN: [id number] DOB: 1920/06/20 Today's Date: 11/17/2012 Time: 4098-1191 OT Time Calculation (min): 15 min  OT Assessment / Plan / Recommendation Clinical Impression  This 79 year old man with metastatic prostate  CA was admitted with tox encephalopathy due to pain medication, intractable L hip pain, confusion and gait instability.  He has decreased activity tolerance due to pain.  Will trial OT to see if we can improve adls/functional transfers.      OT Assessment  Patient needs continued OT Services    Follow Up Recommendations  SNF    Barriers to Discharge      Equipment Recommendations   (to be further assessed, maybe drop arm 3:1)    Recommendations for Other Services    Frequency  Min 2X/week    Precautions / Restrictions Precautions Precautions: Back Precaution Booklet Issued: No Precaution Comments: uncontrolled pain Restrictions Weight Bearing Restrictions: No   Pertinent Vitals/Pain 10/10 pain in  L hip when sitting EOB. Pt was able to roll onto L side.  Back also painful--rolled for bed mobility.  Pt was premedicated.  Repositioned    ADL  Grooming: Simulated;Set up Where Assessed - Grooming: Supine, head of bed up Upper Body Bathing: Simulated;Set up Where Assessed - Upper Body Bathing: Supine, head of bed up Lower Body Bathing: Simulated;+1 Total assistance Where Assessed - Lower Body Bathing: Rolling right and/or left Upper Body Dressing: Simulated;Minimal assistance Where Assessed - Upper Body Dressing: Supine, head of bed up Lower Body Dressing: Simulated;+1 Total assistance Where Assessed - Lower Body Dressing: Rolling right and/or left Toileting - Clothing Manipulation and Hygiene: Performed;Maximal assistance Where Assessed - Toileting Clothing Manipulation and Hygiene:  (rolling  to L) Transfers/Ambulation Related to ADLs: sat eob only.  Unweighted L hip and unable to attempt to  stand due to pain.  can only tolerate hob about 30 degrees ADL Comments: rolled in bed for bedpan    OT Diagnosis: Generalized weakness;Acute pain  OT Problem List: Decreased strength;Decreased activity tolerance;Decreased knowledge of use of DME or AE;Pain OT Treatment Interventions: Self-care/ADL training;Therapeutic exercise;DME and/or AE instruction;Therapeutic activities;Patient/family education   OT Goals Acute Rehab OT Goals OT Goal Formulation: With patient Time For Goal Achievement: 12/01/12 Potential to Achieve Goals: Good ADL Goals Pt Will Transfer to Toilet: with mod assist;Drop arm 3-in-1 (sliding board) ADL Goal: Toilet Transfer - Progress: Goal set today Miscellaneous OT Goals Miscellaneous OT Goal #1: pt will tolerate sitting eob for ub adls with min guard x 5 minutes OT Goal: Miscellaneous Goal #1 - Progress: Goal set today Miscellaneous OT Goal #2: pt will complete LB adls (bathing, depends) with reacher, bed level, rolling with mod A OT Goal: Miscellaneous Goal #2 - Progress: Goal set today  Visit Information  Last OT Received On: 11/17/12 Assistance Needed: +2    Subjective Data  Subjective: I can't stand:  I'm in excruiating pain Patient Stated Goal: none stated but agreeable to OT/PT   Prior Functioning     Home Living Additional Comments: did not ask questions: HOH, no batteries for hearing aid.  Was writing and gesturing Prior Function Comments: unknown Communication Communication: HOH         Vision/Perception     Cognition  Cognition Overall Cognitive Status: Appears within functional limits for tasks assessed/performed Arousal/Alertness: Awake/alert Orientation Level: Appears intact for tasks assessed Behavior During Session: [facility] for tasks performed    Extremity/Trunk Assessment Right Upper Extremity Assessment RUE ROM/Strength/Tone: Deficits RUE ROM/Strength/Tone  Deficits: shoulder limited to 90 FF Left Upper Extremity  Assessment LUE ROM/Strength/Tone: Within functional levels Right Lower Extremity Assessment RLE ROM/Strength/Tone: [facility] for tasks assessed (pt is able to move legs against gravity in supine ) Left Lower Extremity Assessment LLE ROM/Strength/Tone: [facility] for tasks assessed (Comment: pt is able to move legs against gravity in supine ) Trunk Assessment Trunk Assessment: Other exceptions Trunk Exceptions: pt indicates severe pain in back     Mobility Bed Mobility Bed Mobility: Rolling Right;Rolling Left Rolling Right: 3: Mod assist;With rail Rolling Left: 3: Mod assist Details for Bed Mobility Assistance: pt needs verbal cues and hand over hand assist to reach for bedrails Transfers Sit to Stand: Other (comment) Details for Transfer Assistance: attempted several times with bed elevated, use of walker, cues to stand mostly on right leg.  Pt unable to shift weight forward onto legs because of severe pain in back     Exercise     Balance Balance Balance Assessed: Yes Static Sitting Balance Static Sitting - Balance Support: No upper extremity supported;Feet supported;Feet unsupported Static Sitting - Level of Assistance: 5: Stand by assistance Static Sitting - Comment/# of Minutes: pt with increasing pain while sitting on EOB , and had to lie down to alleviate the pain    End of Session OT - End of Session Activity Tolerance: Patient limited by pain Patient left: in bed;with call bell/phone within reach;with nursing in room  GO     [facility] 11/17/2012, 10:49 AM Marica Otter, OTR/L [phone] 11/17/2012

## 2012-11-17 NOTE — Evaluation (Signed)
Physical Therapy Evaluation Patient Details Name: Guy [name] MRN: [id number] DOB: 05/09/1920 Today's Date: 11/17/2012 Time: 4098-1191 PT Time Calculation (min): 12 min  PT Assessment / Plan / Recommendation Clinical Impression  79 yo male with history of metastatic prostated cancer admitted for uncontrolled pain and history of falls.  He is unable to tolerate prolonged sitting on EOB or attempt to stand at this point.  He will need continued Pt at d/c to progress functional mobility within his pain tolerance to decrease burden of care    PT Assessment  Patient needs continued PT services    Follow Up Recommendations  SNF    Does the patient have the potential to tolerate intense rehabilitation      Barriers to Discharge Decreased caregiver support      Equipment Recommendations  Rolling walker with 5" wheels    Recommendations for Other Services     Frequency Min 3X/week    Precautions / Restrictions Precautions Precautions: Back Precaution Booklet Issued: No Precaution Comments: uncontrolled pain Restrictions Weight Bearing Restrictions: No   Pertinent Vitals/Pain Pt with excruciating pain when sitting on EOB.  Pt unable to attempt to stand due to pain      Mobility  Bed Mobility Bed Mobility: Rolling Right;Rolling Left Rolling Right: 3: Mod assist;With rail Details for Bed Mobility Assistance: pt needs verbal cues and hand over hand assist to reach for bedrails Transfers Transfers: Sit to Stand (pt is unable to stand due to severe pain) Sit to Stand: Other (comment) Details for Transfer Assistance: attempted several times with bed elevated, use of walker, cues to stand mostly on right leg.  Pt unable to shift weight forward onto legs because of severe pain in back Ambulation/Gait Ambulation/Gait Assistance: Not tested (comment) Assistive device: Rolling walker    Exercises     PT Diagnosis: Difficulty walking;Generalized weakness;Acute pain  PT Problem  List: Decreased activity tolerance;Pain;Decreased knowledge of use of DME;Decreased mobility PT Treatment Interventions:     PT Goals Acute Rehab PT Goals PT Goal Formulation: With patient Time For Goal Achievement: 12/01/12 Potential to Achieve Goals: Fair Pt will Roll Supine to Right Side: with modified independence PT Goal: Rolling Supine to Right Side - Progress: Goal set today Pt will Roll Supine to Left Side: with modified independence PT Goal: Rolling Supine to Left Side - Progress: Goal set today Pt will go Supine/Side to Sit: with min assist PT Goal: Supine/Side to Sit - Progress: Goal set today Pt will Sit at Edge of Bed: with modified independence;6-10 min PT Goal: Sit at [place] Of Bed - Progress: Goal set today Pt will go Sit to Supine/Side: with min assist PT Goal: Sit to Supine/Side - Progress: Goal set today Pt will Transfer Bed to Chair/Chair to Bed: with min assist PT Transfer Goal: Bed to Chair/Chair to Bed - Progress: Goal set today  Visit Information  Last PT Received On: 11/17/12 Assistance Needed: +2 PT/OT Co-Evaluation/Treatment: Yes    Subjective Data  Subjective: the pain is excruciating Patient Stated Goal: to lie down to get rid of the pain   Prior Functioning  Home Living Additional Comments:  (pt HOH so did not assess previous function. ) Prior Function Comments: per chart, wife unable to manage hiim at home due to immobility from uncontrolled pain Communication Communication: HOH (no batteries for heaing aids. Pt follows gestures)    Cognition  Cognition Overall Cognitive Status: Appears within functional limits for tasks assessed/performed Arousal/Alertness: Awake/alert Orientation Level: Appears intact for tasks  assessed Behavior During Session: [facility] for tasks performed    Extremity/Trunk Assessment Right Lower Extremity Assessment RLE ROM/Strength/Tone: [facility] for tasks assessed (pt is able to move legs against gravity in supine ) Left Lower  Extremity Assessment LLE ROM/Strength/Tone: [facility] for tasks assessed (Comment: pt is able to move legs against gravity in supine ) Trunk Assessment Trunk Assessment: Other exceptions Trunk Exceptions: pt indicates severe pain in back   Balance Balance Balance Assessed: Yes Static Sitting Balance Static Sitting - Balance Support: No upper extremity supported;Feet supported;Feet unsupported Static Sitting - Level of Assistance: 5: Stand by assistance Static Sitting - Comment/# of Minutes: pt with increasing pain while sitting on EOB , and had to lie down to alleviate the pain   End of Session PT - End of Session Activity Tolerance: Patient limited by pain Patient left: in bed;with nursing in room  GP    Teresa K. Manson Passey, [place] [phone] 11/17/2012, 9:57 AM

## 2012-11-17 NOTE — Progress Notes (Addendum)
[facility] PROGRESS NOTE  Guy [name]:[id number] DOB: 1919/10/17 DOA: 11/14/2012 PCP: Junious Silk, MD  Brief narrative: Guy [name] is an 79 y.o. male with a PMH of metastatic prostate cancer, on Lupron injections, last injection 10/29/12 who developed left sided hip pain after his injection. He was evaluated as an outpatient with an MRI of the thoracic and lumbar spine on 11/08/2012 (positive for a remote T4 vertebral body compression deformity, and posterior disc bulges and facet hypertrophy resulting in right greater than left foraminal narrowing at L3-L4, L4-L5, and L5-L6, advanced on the right at L4-L5 and L5-L6) and subsequently was diagnosed with severe sciatica. He was admitted to the hospital on 11/16/2012 with intractable left hip pain, confusion, and gait instability.  Assessment/Plan: Principal Problem:  Encephalopathy, toxic secondary to pain medication  -Patient appears to have an acute encephalopathy that is likely from pain medications although brain metastasis given his metastatic prostate cancer also in the differential.  -Difficult problem in that the patient does have intractable pain that we must treat however he is fair he sensitive to pain medications. -We can cautiously try nonsteroidal anti-inflammatories with close monitoring of his renal function.  -CT of the head negative for metastatic disease. Active Problems:  Intractable pain, left hip  -This could be sciatica but given his history of metastatic prostate cancer, worrisome for bone involvement. He has had recent outpatient MRI scans of his lumbar and thoracic spine which did not show any evidence of metastatic deposits.  -Followup bone scan (ordered on admission) to see if there is any evidence of metastatic bone disease involving his pelvic girdle. Would defer a PET scan for now, which can be done as an outpatient.  -We'll try to manage pain with nonsteroidal anti-inflammatories and low-dose  narcotics.  -Add prednisone taper.   -Spoke with Dr. Turner Daniels (orthopedics) who recommended obtaining AP pelvis and cross table lateral films of the left hip.  Ordered.  Appreciate Dr. Wadie Lessen assistance in getting this patient appropriately evaluated. -Addendum: Spoke with Dr. Turner Daniels later in the day, who reviewed all the patient's studies and examined him in the interim.  He feels that his symptoms may be from L4-L5 and L5-L6 foraminal narrowing and spoke with him about having IR do an epidural steroid injection.  He is thinking about it. Please re-visit this option with him in the a.m. Gait instability  -Physical and occupational therapy evaluations requested and are pending.  -Patient is currently a 2+ assist and is unsafe to return home with his frail, elderly wife.  Hyponatremia  -Maybe secondary to dehydration. We'll gently hydrate.  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  -Will get bone scan. Alkaline phosphatase levels are elevated which would be consistent with metastatic bone disease. -Continue Casodex.  DM (diabetes mellitus)  -Continue Amaryl, 16 units of Lantus each bedtime, and moderate scale SSI. CBGs 107-148. Dehydration  -BUN to creatinine ratio elevated over usual baseline values. Likely secondary to poor by mouth intake. Hydrate and monitor.  Leukocytosis, unspecified  -No obvious infectious etiology. UA done on 11/14/2012 negative for nitrites and leukocytes. CXR done 11/14/12 negative for PNA.  Normocytic anemia  -Likely anemia of chronic disease. Check B12 level.  Dyspnea  -Likely from an enlarging pulmonary metastasis. Supplemental oxygen as needed for oxygen saturation less than 92%.  Ascites  -Will get a diagnostic and therapeutic paracentesis with studies.   Code Status: DNR  Family Communication: Teryl Lucy [phone],  cell [phone].  Disposition Plan: Probably SNF.   Medical Consultants:  Dr.  Turner Daniels, Orthopedics  Other Consultants:  Physical therapy  Occupational therapy  Anti-infectives:  None.  HPI/Subjective: Guy [name] is still complaining of left hip pain.  He is extremely HOH.  He has not yet been up.    Objective: Filed Vitals:   11/16/12 1217 11/16/12 1345 11/16/12 2111 11/17/12 0525  BP: 135/79 152/55 154/55 165/63  Pulse: 61 56 65 60  Temp: 97.5 F (36.4 C) 98.8 F (37.1 C) 99.2 F (37.3 C) 97.6 F (36.4 C)  TempSrc: Oral Oral Oral Oral  Resp: 20 18 18 18   SpO2: 96% 96% 93% 99%    Intake/Output Summary (Last 24 hours) at 11/17/12 0704 Last data filed at 11/16/12 1613  Gross per 24 hour  Intake      0 ml  Output    150 ml  Net   -150 ml    Exam: Gen:  NAD Cardiovascular:  RRR, No M/R/G Respiratory:  Lungs CTAB Gastrointestinal:  Abdomen soft, NT/ND, + BS Extremities:  No C/E/C  Data Reviewed: Basic Metabolic Panel:  Recent Labs Lab 11/13/12 1534 11/14/12 1040 11/16/12 1535 11/17/12 0400  NA 133* 133*  --  132*  K 3.7 4.1  --  3.8  CL 96 95*  --  95*  CO2 27 30  --  28  GLUCOSE 118* 194*  --  109*  BUN 20 27*  --  26*  CREATININE 1.03 1.12 1.31 1.18  CALCIUM 9.4 9.7  --  8.9   GFR The CrCl is unknown because both a height and weight (above a minimum accepted value) are required for this calculation. Liver Function Tests:  Recent Labs Lab 11/17/12 0400  AST 40*  ALT 45  ALKPHOS 208*  BILITOT 0.5  PROT 6.3  ALBUMIN 2.5*    Recent Labs Lab 11/17/12 0408  AMMONIA 33   CBC:  Recent Labs Lab 11/13/12 1534 11/14/12 1040 11/16/12 1535 11/17/12 0400  WBC 8.6 12.0* 14.2* 11.0*  NEUTROABS  --  10.3*  --   --   HGB 12.8* 12.5* 11.7* 11.1*  HCT 37.7* 36.5* 34.4* 30.9*  MCV 95.2 96.3 95.8 95.1  PLT 162 183 206 166   CBG:  Recent Labs Lab 11/15/12 2108 11/16/12 0822 11/16/12 1220 11/16/12 1715 11/16/12 1958  GLUCAP 137* 107* 148* 132* 132*    Procedures and Diagnostic Studies: Dg Chest 1  View  11/14/2012  *RADIOLOGY REPORT*  Clinical Data: Weakness, shortness of breath.  CHEST - 1 VIEW  Comparison: 06/27/2011  Findings: Heart is upper limits normal in size.  Nodular density projects in the left upper lobe.  Cannot exclude pulmonary nodule. This has progressed since prior study.  Otherwise no confluent airspace opacities.  No effusions.  No acute bony abnormality.  Stable deformity of the proximal right humerus.  Degenerative changes in the right AC joint.  IMPRESSION: Enlarging nodular density in the left upper lobe.  Cannot exclude pulmonary nodule/neoplasm.  Recommend further evaluation with chest CT.  Mild COPD.   Original Report Authenticated By: Charlett Nose, M.D.    Ct Head W Wo Contrast  11/16/2012  *RADIOLOGY REPORT*  Clinical Data: Prostate carcinoma, leg pain  CT HEAD WITHOUT AND WITH CONTRAST  Technique:  Contiguous axial images were obtained from the base of the skull through the vertex without and with intravenous contrast.  Contrast: 80mL OMNIPAQUE IOHEXOL 300 MG/ML  SOLN  Comparison: 06/14/2010  Findings: Atherosclerotic and  physiologic intracranial calcifications.  Mucoperiosteal thickening in the right maxillary sinus.  Stable lacunar infarct in the left basal ganglia. Diffuse parenchymal atrophy. Patchy areas of hypoattenuation in deep and periventricular white matter bilaterally. Negative for acute intracranial hemorrhage, mass lesion, acute infarction, midline shift, or mass-effect.  No   unexpected enhancement after IV contrast administration.  Ventricles and sulci symmetric. Bone windows demonstrate no focal lesion.  IMPRESSION:  1. Negative for bleed, metastatic disease, or other acute intracranial process.  2. Atrophy and nonspecific white matter changes. 3.  Right maxillary sinus disease.   Original Report Authenticated By: D. Andria Rhein, MD    Ct Chest Wo Contrast  11/14/2012  *RADIOLOGY REPORT*  Clinical Data: Lung mass  CT CHEST WITHOUT CONTRAST  Technique:   Multidetector CT imaging of the chest was performed following the standard protocol without IV contrast.  Comparison: 10/08/2009  Findings: Lobulated and spiculated left upper lobe pulmonary nodule has markedly increased in size from 11 mm to 26 x 16 mm.  Irregular patchy and spiculated density in the right upper lobe posteriorly measures 1.8 x 1.0 cm on image 25.  Adjacent irregular smaller patchy densities are noted.  Focal ground-glass and irregular opacities in the right lower lobe on image 38 and compress in diameter of 2.6 x 1.6 cm.  9 mm nodule in the right lower lobe on image 49 is stable.  Tiny pleural effusions with basilar atelectasis.  Images of the upper abdomen demonstrate no significant change in the appearance of the liver.  Gastrohepatic ligament nodes are partially imaged.  Three-vessel coronary artery calcifications.  Aortic valve calcifications.  Mitral valve calcifications.  Negative for abnormal mediastinal adenopathy.  Calcified granulomata are noted in the right upper lobe.  Chronic appearing right-sided rib deformities.  Markedly irregular appearance of the proximal right humerus with bony expansion and lytic areas.  This is not significantly changed compared with prior chest radiographs. Stable thoracic spine.  IMPRESSION: Worsening left upper lobe nodule as described worrisome for malignancy.  PET CT is recommended.  There are multiple other findings worrisome for pulmonary malignancy.  These can also be assessed on the PET CT.  Stable right lower lobe pulmonary nodule.  Calcified granulomata are noted.   Original Report Authenticated By: Jolaine Click, M.D.     Scheduled Meds: . aspirin EC  81 mg Oral QPM  . atorvastatin  40 mg Oral q1800  . bicalutamide  50 mg Oral Daily  . calcium-vitamin D  1 tablet Oral BID  . cycloSPORINE  1 drop Both Eyes BID  . docusate sodium  100 mg Oral BID  . enoxaparin (LOVENOX) injection  40 mg Subcutaneous Q24H  . gabapentin  100 mg Oral TID  .  glimepiride  4 mg Oral QAC breakfast  . insulin aspart  0-15 Units Subcutaneous TID WC  . insulin aspart  0-5 Units Subcutaneous QHS  . insulin glargine  16 Units Subcutaneous QHS  . losartan  50 mg Oral Daily  . metoprolol  25 mg Oral BID  . multivitamin with minerals  1 tablet Oral Daily  . oxybutynin  5 mg Oral QHS  . pantoprazole  40 mg Oral Daily  . polyethylene glycol  17 g Oral Daily  . sertraline  200 mg Oral QHS   Continuous Infusions: . sodium chloride 1,000 mL (11/17/12 0100)    Time spent: 35 minutes.   LOS: 3 days   Elmira Olkowski  [facility] Pager [phone].  If 8PM-8AM, please contact night-coverage at  [email or web address], password [facility] 11/17/2012, 7:04 AM

## 2012-11-18 ENCOUNTER — Inpatient Hospital Stay (HOSPITAL_COMMUNITY): Payer: Medicare Other

## 2012-11-18 DIAGNOSIS — E119 Type 2 diabetes mellitus without complications: Secondary | ICD-10-CM

## 2012-11-18 DIAGNOSIS — E860 Dehydration: Secondary | ICD-10-CM

## 2012-11-18 DIAGNOSIS — R188 Other ascites: Secondary | ICD-10-CM

## 2012-11-18 LAB — BASIC METABOLIC PANEL
CO2: 27 mEq/L (ref 19–32)
Chloride: 100 mEq/L (ref 96–112)
Glucose, Bld: 132 mg/dL — ABNORMAL HIGH (ref 70–99)
Potassium: 3.6 mEq/L (ref 3.5–5.1)
Sodium: 135 mEq/L (ref 135–145)

## 2012-11-18 LAB — GLUCOSE, CAPILLARY
Glucose-Capillary: 128 mg/dL — ABNORMAL HIGH (ref 70–99)
Glucose-Capillary: 57 mg/dL — ABNORMAL LOW (ref 70–99)
Glucose-Capillary: 140 mg/dL — ABNORMAL HIGH (ref 70–99)

## 2012-11-18 LAB — CBC
Hemoglobin: 9.5 g/dL — ABNORMAL LOW (ref 13.0–17.0)
RBC: 2.93 MIL/uL — ABNORMAL LOW (ref 4.22–5.81)
WBC: 6.8 10*3/uL (ref 4.0–10.5)

## 2012-11-18 MED ORDER — DEXTROSE 50 % IV SOLN: 25.0000 mL | Freq: Once | INTRAVENOUS | Status: AC | PRN

## 2012-11-18 MED ORDER — METOPROLOL TARTRATE 25 MG PO TABS
25.0000 mg | ORAL_TABLET | Freq: Two times a day (BID) | ORAL | Status: DC
  Administered 2012-11-18 – 2012-11-23 (×8): 25 mg via ORAL
  Filled 2012-11-18 (×12): qty 1

## 2012-11-18 MED ORDER — DEXTROSE 50 % IV SOLN
INTRAVENOUS | Status: AC
  Administered 2012-11-18: 50 mL
  Filled 2012-11-18: qty 50

## 2012-11-18 MED ORDER — INSULIN GLARGINE 100 UNIT/ML ~~LOC~~ SOLN
14.0000 [IU] | Freq: Every day | SUBCUTANEOUS | Status: DC
  Administered 2012-11-18 – 2012-11-20 (×3): 14 [IU] via SUBCUTANEOUS

## 2012-11-18 NOTE — Progress Notes (Signed)
Patient ID: Guy [name], male   DOB: Oct 23, 1919, 79 y.o.   MRN: [id number] 79 YO male referred for L L4/5 epidural corticosteroid injection. He has severe L back and hip pain. Remote MRI from 2012 demonstrates foraminal stenosis at L4/5. A recent bone scan demonstrates activity in L3. Fluorscopic imaging on our fluoro table demonstrates an L3 compression. Plain radiographs of the L spine confirm an L3 compression fracture. At this point I would recommend obtaining the recent MRI from [facility] to determine if there is edema in L3 indicating an acute fracture. I would hold off on corticosteroids until then.

## 2012-11-18 NOTE — Progress Notes (Signed)
No urinary output since foley removed. Bladder scanned pt, scanner reads 0 ML in bladder. Notified On call.

## 2012-11-18 NOTE — Progress Notes (Signed)
[facility] PROGRESS NOTE  Guy [name] EAV:[id number] DOB: Feb 27, 1920 DOA: 11/14/2012 PCP: Junious Silk, MD  Assessment/Plan:  Encephalopathy, toxic secondary to pain medication  -Patient appears to have an acute encephalopathy that is likely from pain medications although brain metastasis given his metastatic prostate cancer also in the differential.  -Difficult problem in that the patient does have intractable pain that we must treat however he is fair he sensitive to pain medications.  -Will try steroids injection for pain to limit narcotic.  -CT of the head negative for metastatic disease.   Intractable pain, left hip  -This could be sciatica but given his history of metastatic prostate cancer, worrisome for bone involvement. He has had recent outpatient MRI scans of his lumbar and thoracic spine which did not show any evidence of metastatic deposits.  -Followup bone scan (ordered on admission) to see if there is any evidence of metastatic bone disease involving his pelvic girdle. Would defer a PET scan for now, which can be done as an outpatient.  -We'll try to manage pain with nonsteroidal anti-inflammatories and low-dose narcotics.  -Add prednisone taper.  Dr Merryl Hacker with Dr. Turner Daniels 2-18, who reviewed all the patient's studies and examined him in the interim. He feels that his symptoms may be from L4-L5 and L5-L6 foraminal narrowing and spoke with him about having IR do an epidural steroid injection. Will sk IR for injection. Discussed with wife and patient.   Gait instability  -Physical and occupational therapy evaluations requested and are pending.  -Patient is currently a 2+ assist and is unsafe to return home with his frail, elderly wife.  SNF at time of discharge. Hyponatremia  -Maybe secondary to dehydration. We'll gently hydrate.  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  -bone scan result  below. Alkaline phosphatase levels are elevated which would be consistent with metastatic bone disease.  -Continue Casodex.  DM (diabetes mellitus)  -Continue Amaryl, 16 units of Lantus each bedtime, and moderate scale SSI. CBGs 107-148.  -Hypoglycemia, will decrease lantus to 14 units.  Dehydration  -BUN to creatinine ratio elevated over usual baseline values. Likely secondary to poor by mouth intake. Hydrate and monitor.  -B-met today pending. Leukocytosis, unspecified  -No obvious infectious etiology. UA done on 11/14/2012 negative for nitrites and leukocytes. CXR done 11/14/12 negative for PNA.  -Follow trend. Normocytic anemia  -Likely anemia of chronic disease.  B12 level at 840.  Dyspnea  -Likely from an enlarging pulmonary metastasis. Supplemental oxygen as needed for oxygen saturation less than 92%.  Ascites  -Will get a diagnostic and therapeutic paracentesis with studies. hopefully today.   Code Status: DNR Family Communication: Spoke with  Disposition Plan: SNF when stable.    Consultants: Dr. Turner Daniels, Orthopedics Dr Jennette Dubin.   Procedures:  Paracentesis. Pending.  Bone scan: Increased activity within the mid to lower lumbar spine, likely L3.  Cannot exclude compression fracture. Recommend further evaluation  with lumbar spine series.  Subtle focal increased activity at the tip of the left greater  trochanter. Given the location and appearance on the plain films,  this could be related to enthesopathic changes.  Antibiotics:  none  HPI/Subjective: Pain 7/10 hip  and back. He refer mw to speak with wife regarding injection.   Objective: Filed Vitals:   11/17/12 2246 11/18/12 0626 11/18/12 0900 11/18/12 0949  BP: 144/62 127/46 148/61 148/61  Pulse: 63 67 52 52  Temp:  97.6 F (36.4  C) 98.1 F (36.7 C)   TempSrc:  Oral Oral   Resp:  24    SpO2:  97% 97%     Intake/Output Summary (Last 24 hours) at 11/18/12 1025 Last data filed at 11/18/12 1000  Gross  per 24 hour  Intake 891.25 ml  Output    850 ml  Net  41.25 ml   There were no vitals filed for this visit.  Exam:   General:  No distress.  Cardiovascular: S 1, S 2 RRR  Respiratory: CTA  Abdomen: Mild distended, NR.   Data Reviewed: Basic Metabolic Panel:  Recent Labs Lab 11/13/12 1534 11/14/12 1040 11/16/12 1535 11/17/12 0400  NA 133* 133*  --  132*  K 3.7 4.1  --  3.8  CL 96 95*  --  95*  CO2 27 30  --  28  GLUCOSE 118* 194*  --  109*  BUN 20 27*  --  26*  CREATININE 1.03 1.12 1.31 1.18  CALCIUM 9.4 9.7  --  8.9   Liver Function Tests:  Recent Labs Lab 11/17/12 0400  AST 40*  ALT 45  ALKPHOS 208*  BILITOT 0.5  PROT 6.3  ALBUMIN 2.5*   No results found for this basename: LIPASE, AMYLASE,  in the last 168 hours  Recent Labs Lab 11/17/12 0408  AMMONIA 33   CBC:  Recent Labs Lab 11/13/12 1534 11/14/12 1040 11/16/12 1535 11/17/12 0400  WBC 8.6 12.0* 14.2* 11.0*  NEUTROABS  --  10.3*  --   --   HGB 12.8* 12.5* 11.7* 11.1*  HCT 37.7* 36.5* 34.4* 30.9*  MCV 95.2 96.3 95.8 95.1  PLT 162 183 206 166   Cardiac Enzymes: No results found for this basename: CKTOTAL, CKMB, CKMBINDEX, TROPONINI,  in the last 168 hours BNP (last 3 results) No results found for this basename: PROBNP,  in the last 8760 hours CBG:  Recent Labs Lab 11/17/12 1232 11/17/12 1717 11/17/12 2141 11/18/12 0749 11/18/12 0825  GLUCAP 110* 184* 229* 57* 128*    No results found for this or any previous visit (from the past 240 hour(s)).   Studies: Dg Pelvis 1-2 Views  11/17/2012  *RADIOLOGY REPORT*  Clinical Data: Left hip pain.  10/04/2011  PELVIS - 1-2 VIEW  Comparison: CT 06/01/2008.  Dates for body bone scan.  Findings: Deformity noted in the left iliac crest at the anterior superior iliac spine.  This likely is related to old injury or enthesopathic changes and is unchanged since 2009.  Slight irregularity noted at the tip of the greater trochanter, likely  enthesopathic changes.  No fracture.  Mild diffuse osteopenia. Since the surgical clips again noted in the pelvis.  IMPRESSION: No acute bony abnormality.  No fracture.  Chronic changes as above.   Original Report Authenticated By: Charlett Nose, M.D.    Dg Hip 1 View Left  11/17/2012  *RADIOLOGY REPORT*  Clinical Data: Left hip pain with no known injury  LEFT HIP - 1 VIEW:  Comparison: 06/14/2010  Findings: Overall assessment of the hip is compromised by the single lateral view.  The hip joint appears mildly narrowed and this is stable.  Deformity of the inferior aspect of the left iliac wing is again noted.  Bone density is stable in comparison with the prior exam and no definite acute abnormality is seen within the femoral head or shaft.  Evaluation of the intertrochanteric zone is compromised by position resulting in bony overlap of the greater trochanter and femoral neck.  IMPRESSION: Stable mild degenerative change of the hip joint. No obvious acute bony abnormality seen with compromised evaluation of the intertrochanteric zone.   Original Report Authenticated By: Rhodia Albright, M.D.    Ct Head W Wo Contrast  11/16/2012  *RADIOLOGY REPORT*  Clinical Data: Prostate carcinoma, leg pain  CT HEAD WITHOUT AND WITH CONTRAST  Technique:  Contiguous axial images were obtained from the base of the skull through the vertex without and with intravenous contrast.  Contrast: 80mL OMNIPAQUE IOHEXOL 300 MG/ML  SOLN  Comparison: 06/14/2010  Findings: Atherosclerotic and physiologic intracranial calcifications.  Mucoperiosteal thickening in the right maxillary sinus.  Stable lacunar infarct in the left basal ganglia. Diffuse parenchymal atrophy. Patchy areas of hypoattenuation in deep and periventricular white matter bilaterally. Negative for acute intracranial hemorrhage, mass lesion, acute infarction, midline shift, or mass-effect.  No   unexpected enhancement after IV contrast administration.  Ventricles and sulci  symmetric. Bone windows demonstrate no focal lesion.  IMPRESSION:  1. Negative for bleed, metastatic disease, or other acute intracranial process.  2. Atrophy and nonspecific white matter changes. 3.  Right maxillary sinus disease.   Original Report Authenticated By: D. Andria Rhein, MD    Nm Bone Scan Whole Body  11/17/2012  *RADIOLOGY REPORT*  Clinical Data: Intractable left hip pain.  NUCLEAR MEDICINE WHOLE BODY BONE SCINTIGRAPHY  Technique:  Whole body anterior and posterior images were obtained approximately 3 hours after intravenous injection of radiopharmaceutical.  Radiopharmaceutical: CURIE TC-MDP TECHNETIUM TC 26M MEDRONATE IV KIT  Comparison: On plain films 11/17/2012  Findings: There is rightward scoliosis in the lumbar spine. Increased activity noted in the lower lumbar spine, likely the L3 level.  Cannot exclude compression fracture.  Recommend further evaluation with lumbar spine series.  Minimal/subtle increased activity at the tip of the left greater trochanter of unknown etiology or significance.  No other areas of abnormal bony uptake.  IMPRESSION: Increased activity within the mid to lower lumbar spine, likely L3. Cannot exclude compression fracture.  Recommend further evaluation with lumbar spine series.  Subtle focal increased activity at the tip of the left greater trochanter.  Given the location and appearance on the plain films, this could be related to enthesopathic changes.   Original Report Authenticated By: Charlett Nose, M.D.     Scheduled Meds: . aspirin EC  81 mg Oral QPM  . atorvastatin  40 mg Oral q1800  . bicalutamide  50 mg Oral Daily  . calcium-vitamin D  1 tablet Oral BID  . cycloSPORINE  1 drop Both Eyes BID  . docusate sodium  100 mg Oral BID  . enoxaparin (LOVENOX) injection  40 mg Subcutaneous Q24H  . gabapentin  100 mg Oral TID  . glimepiride  4 mg Oral QAC breakfast  . insulin aspart  0-15 Units Subcutaneous TID WC  . insulin aspart  0-5 Units  Subcutaneous QHS  . insulin glargine  16 Units Subcutaneous QHS  . losartan  50 mg Oral Daily  . metoprolol  25 mg Oral BID  . multivitamin with minerals  1 tablet Oral Daily  . oxybutynin  5 mg Oral QHS  . pantoprazole  40 mg Oral Daily  . polyethylene glycol  17 g Oral Daily  . predniSONE  50 mg Oral Q breakfast  . sertraline  200 mg Oral QHS   Continuous Infusions: . sodium chloride 75 mL/hr at 11/18/12 1610    Active Problems:   Encephalopathy, toxic secondary to pain medication   Intractable pain,  left hip   Gait instability   Hyponatremia   Prostate cancer metastatic to multiple sites   DM (diabetes mellitus)   Dehydration   Leukocytosis, unspecified   Normocytic anemia   Dyspnea   Ascites    Time spent: 25 minutes    REGALADO,BELKYS  [facility] Pager [phone]. If 8PM-8AM, please contact night-coverage at [email or web address], password [facility] 11/18/2012, 10:25 AM  LOS: 4 days

## 2012-11-18 NOTE — Progress Notes (Signed)
Hypoglycemic Event  CBG: 57  Treatment: D50 IV 25 mL  Symptoms: None  Follow-up CBG: Time:0825 CBG Result:1282  Possible Reasons for Event: Inadequate meal intake  Comments/MD notified:    Armanda Heritage  Remember to initiate Hypoglycemia Order Set & complete

## 2012-11-18 NOTE — Progress Notes (Signed)
Patient ID: COLLEEN DONAHOE, male   DOB: [birth year], 79 y.o.   MRN: [id number] Pt presented to [place] dept today for paracentesis. On prelim [place] abd all four quadrants no significant ascites noted. Procedure was cancelled.

## 2012-11-18 NOTE — Consult Note (Signed)
Urology Consult  Referring physician: Dr. Darnelle Catalan, Dr. Turner Daniels Reason for referral:  Urinary retention with Artificial Urinary Sphincter  Chief Complaint:  Unable to urinate  History of Present Illness:   History of Present Illness:  Guy [name] is an 79 y.o. male with a PMH of metastatic prostate cancer, on Lupron injections, apparantly treated at [facility], but with no notes- and no local Urologist association- with  ? Last Lupron injection 10/29/12.  He developed left sided hip pain after his injection.    He was evaluated as an outpatient with an MRI of the thoracic and lumbar spine on 11/08/2012 (positive for a remote T4 vertebral body compression deformity, and posterior disc bulges and facet hypertrophy resulting in right greater than left foraminal narrowing at L3-L4, L4-L5, and L5-L6, advanced on the right at L4-L5 and L5-L6) and subsequently was diagnosed with severe sciatica. He was prescribed pain medications with no relief. The patient's wife brought him to [facility] on 11/11/2012 and again on 11/13/2012 for evaluation as she cannot manage him at home. He is unable to ambulate secondary to left hip pain. He has fallen at home multiple times. His most recent PSA was elevated at 39.86. He was then placed on Casodex on 11/06/12. The patient was unable to ambulate without 2+ assist per nursing staff, and he was felt to be unsafe to return home. Additionally, he is having intractable hip pain and the pain medication has now made him confused and restless. Patient's wife also reports that he occasionally has nightmares and hallucinations.      Past Medical History  Diagnosis Date  . Hypertension   . Diabetes mellitus without complication   . Arthritis   . Sciatica   . GERD (gastroesophageal reflux disease)   . Bladder cancer   . Urge incontinence   . Prostate cancer     S/P prostatectomy; Lung metastasis  . Vertigo   . Hyperlipidemia   . ED (erectile dysfunction)   . Gastric polyposis   .  Osteoporosis   . Decreased libido   . Depression   . Hepatitis A   . CAD (coronary artery disease)   . Stroke   . History of blood transfusion     1946  . OA (osteoarthritis)    Past Surgical History  Procedure Laterality Date  . Coronary angioplasty with stent placement  1996  . Carpal tunnel release    . Cataract extraction w/ intraocular lens  implant, bilateral  1998  . Prostatectomy    . Urinary sphincter implant    . Urinary sphincter implant revision    . Appendectomy    . Left hip surgery      Donated bone for bone graft to arm  . Penile prosthesis implant      S/P removal and re-implantation of new prosthesis  . Middle ear surgery    . Finger surgery      Left and right  . Hernia repair    . Bladder surgery    . Right arm bone graft      Pathological fracture    Medications: I have reviewed the patient's current medications. Allergies:  Allergies  Allergen Reactions  . Albuterol Sulfate Hfa (ZOX:[id number]) Shortness Of Breath  . Adhesive (Tape) Other (See Comments)    REACTION: SKIN BLISTERS  . Penicillins Other (See Comments)    REACTION: ITCHING HANDS  . Sulfa Drugs Cross Reactors Hives    Family History  Problem Relation Age of Onset  . Heart  failure Mother   . Bladder Cancer Father   . Pancreatic cancer Sister   . Lung cancer Sister   . Breast cancer Daughter   . Liver disease Son    Social History:  reports that he has quit smoking. He has never used smokeless tobacco. He reports that he does not drink alcohol or use illicit drugs.  ROS: All systems are reviewed and negative except as noted.Pt is unable to commjunicate. ROS from Dr. Darnelle Catalan:  No fever, no chills; Appetite normal; + weight loss, no weight gain. HEENT: No blurry vision, no diplopia, no pharyngitis, no dysphagia CV: No chest pain, no palpitations. Resp: + SOB, no cough. GI: No nausea, no vomiting, no diarrhea, no melena, no hematochezia. GU: No dysuria, no hematuria. MSK: Left hip pain,  diffuse myalgias and arthralgias. Neuro: No headache, no focal neurological deficits, no history of seizures. Psych: No depression, no anxiety. Endo: No thyroid disease, + DM, no heat intolerance, no cold intolerance, no polyuria, no polydipsia Skin: No rashes, no skin lesions. Heme: No easy bruising, no history of blood diseases.   Physical Exam:  Vital signs in last 24 hours: Temp:  [97.6 F (36.4 C)-99.4 F (37.4 C)] 97.6 F (36.4 C) (02/19 0626) Pulse Rate:  [63-69] 67 (02/19 0626) Resp:  [18-24] 24 (02/19 0626) BP: (127-168)/(46-77) 127/46 mmHg (02/19 0626) SpO2:  [96 %-98 %] 97 % (02/19 0626)  Cardiovascular: Skin warm; not flushed Respiratory: Breaths quiet; no shortness of breath Abdomen: No masses Neurological: Normal sensation to touch Musculoskeletal: Normal motor function arms and legs Lymphatics: No inguinal adenopathy Skin: No rashes Genitourinary: AUS in place. Locked -out. Foley catheter in position. Penis, scrotum wnl.   Laboratory Data:  Results for orders placed during the hospital encounter of 11/14/12 (from the past 72 hour(s))  GLUCOSE, CAPILLARY     Status: Abnormal   Collection Time    11/15/12 11:47 AM      Result Value Range   Glucose-Capillary 139 (*) 70 - 99 mg/dL  GLUCOSE, CAPILLARY     Status: None   Collection Time    11/15/12  4:51 PM      Result Value Range   Glucose-Capillary 90  70 - 99 mg/dL  GLUCOSE, CAPILLARY     Status: Abnormal   Collection Time    11/15/12  9:08 PM      Result Value Range   Glucose-Capillary 137 (*) 70 - 99 mg/dL  GLUCOSE, CAPILLARY     Status: Abnormal   Collection Time    11/16/12  8:22 AM      Result Value Range   Glucose-Capillary 107 (*) 70 - 99 mg/dL   Comment 1 Notify RN    GLUCOSE, CAPILLARY     Status: Abnormal   Collection Time    11/16/12 12:20 PM      Result Value Range   Glucose-Capillary 148 (*) 70 - 99 mg/dL   Comment 1 Notify RN    CBC     Status: Abnormal   Collection Time    11/16/12  3:35  PM      Result Value Range   WBC 14.2 (*) 4.0 - 10.5 K/uL   RBC 3.59 (*) 4.22 - 5.81 MIL/uL   Hemoglobin 11.7 (*) 13.0 - 17.0 g/dL   HCT 16.1 (*) 09.6 - 04.5 %   MCV 95.8  78.0 - 100.0 fL   MCH 32.6  26.0 - 34.0 pg   MCHC 34.0  30.0 - 36.0 g/dL  RDW 13.1  11.5 - 15.5 %   Platelets 206  150 - 400 K/uL  CREATININE, SERUM     Status: Abnormal   Collection Time    11/16/12  3:35 PM      Result Value Range   Creatinine, Ser 1.31  0.50 - 1.35 mg/dL   GFR calc non Af Amer 46 (*) >90 mL/min   GFR calc Af Amer 54 (*) >90 mL/min   Comment:            The eGFR has been calculated     using the CKD EPI equation.     This calculation has not been     validated in all clinical     situations.     eGFR's persistently     <90 mL/min signify     possible Chronic Kidney Disease.  GLUCOSE, CAPILLARY     Status: Abnormal   Collection Time    11/16/12  5:15 PM      Result Value Range   Glucose-Capillary 132 (*) 70 - 99 mg/dL   Comment 1 Documented in Chart     Comment 2 Notify RN    GLUCOSE, CAPILLARY     Status: Abnormal   Collection Time    11/16/12  7:58 PM      Result Value Range   Glucose-Capillary 132 (*) 70 - 99 mg/dL   Comment 1 Notify RN    CBC     Status: Abnormal   Collection Time    11/17/12  4:00 AM      Result Value Range   WBC 11.0 (*) 4.0 - 10.5 K/uL   RBC 3.25 (*) 4.22 - 5.81 MIL/uL   Hemoglobin 11.1 (*) 13.0 - 17.0 g/dL   HCT 04.5 (*) 40.9 - 81.1 %   MCV 95.1  78.0 - 100.0 fL   MCH 34.2 (*) 26.0 - 34.0 pg   MCHC 35.9  30.0 - 36.0 g/dL   RDW 91.4  78.2 - 95.6 %   Platelets 166  150 - 400 K/uL  COMPREHENSIVE METABOLIC PANEL     Status: Abnormal   Collection Time    11/17/12  4:00 AM      Result Value Range   Sodium 132 (*) 135 - 145 mEq/L   Potassium 3.8  3.5 - 5.1 mEq/L   Chloride 95 (*) 96 - 112 mEq/L   CO2 28  19 - 32 mEq/L   Glucose, Bld 109 (*) 70 - 99 mg/dL   BUN 26 (*) 6 - 23 mg/dL   Creatinine, Ser 2.13  0.50 - 1.35 mg/dL   Calcium 8.9  8.4 - 08.6  mg/dL   Total Protein 6.3  6.0 - 8.3 g/dL   Albumin 2.5 (*) 3.5 - 5.2 g/dL   AST 40 (*) 0 - 37 U/L   ALT 45  0 - 53 U/L   Alkaline Phosphatase 208 (*) 39 - 117 U/L   Total Bilirubin 0.5  0.3 - 1.2 mg/dL   GFR calc non Af Amer 52 (*) >90 mL/min   GFR calc Af Amer 61 (*) >90 mL/min   Comment:            The eGFR has been calculated     using the CKD EPI equation.     This calculation has not been     validated in all clinical     situations.     eGFR's persistently     <90 mL/min signify  possible Chronic Kidney Disease.  VITAMIN B12     Status: None   Collection Time    11/17/12  4:00 AM      Result Value Range   Vitamin B-12 840  211 - 911 pg/mL  AMMONIA     Status: None   Collection Time    11/17/12  4:08 AM      Result Value Range   Ammonia 33  11 - 60 umol/L  GLUCOSE, CAPILLARY     Status: None   Collection Time    11/17/12  7:54 AM      Result Value Range   Glucose-Capillary 77  70 - 99 mg/dL   Comment 1 Documented in Chart     Comment 2 Notify RN    GLUCOSE, CAPILLARY     Status: Abnormal   Collection Time    11/17/12 12:32 PM      Result Value Range   Glucose-Capillary 110 (*) 70 - 99 mg/dL   Comment 1 Documented in Chart     Comment 2 Notify RN    GLUCOSE, CAPILLARY     Status: Abnormal   Collection Time    11/17/12  5:17 PM      Result Value Range   Glucose-Capillary 184 (*) 70 - 99 mg/dL   Comment 1 Notify RN    GLUCOSE, CAPILLARY     Status: Abnormal   Collection Time    11/17/12  9:41 PM      Result Value Range   Glucose-Capillary 229 (*) 70 - 99 mg/dL  GLUCOSE, CAPILLARY     Status: Abnormal   Collection Time    11/18/12  7:49 AM      Result Value Range   Glucose-Capillary 57 (*) 70 - 99 mg/dL   No results found for this or any previous visit (from the past 240 hour(s)). Creatinine: 1.18/ gfr 52  Recent Labs  11/13/12 1534 11/14/12 1040 11/16/12 1535 11/17/12 0400  CREATININE 1.03 1.12 1.31 1.18    Xrays: *RADIOLOGY REPORT*   Clinical Data: Intractable left hip pain.  NUCLEAR MEDICINE WHOLE BODY BONE SCINTIGRAPHY  Technique: Whole body anterior and posterior images were obtained  approximately 3 hours after intravenous injection of  radiopharmaceutical.  Radiopharmaceutical: CURIE TC-MDP TECHNETIUM TC 66M  MEDRONATE IV KIT  Comparison: On plain films 11/17/2012  Findings: There is rightward scoliosis in the lumbar spine.  Increased activity noted in the lower lumbar spine, likely the L3  level. Cannot exclude compression fracture. Recommend further  evaluation with lumbar spine series.  Minimal/subtle increased activity at the tip of the left greater  trochanter of unknown etiology or significance. No other areas of  abnormal bony uptake.  IMPRESSION:  Increased activity within the mid to lower lumbar spine, likely L3.  Cannot exclude compression fracture. Recommend further evaluation  with lumbar spine series.  Subtle focal increased activity at the tip of the left greater  trochanter. Given the location and appearance on the plain films,  this could be related to enthesopathic changes.  Original Report Authenticated By: Charlett Nose, M.D.    Impression/Assessment:  Metastatic CaP, Rx per [facility]. He has had Artificial urinary sphincter for post radical prostatectomy sphincteric incontinence in the past,. Removed and replaced for infection. He now has a "locked-out" sphincter, wth foley in place.   Plan:    Advise remove foley when pt is able to sit on bedside commode or transfer to toilet, to avoid chance of erosion. Would leave sphincter open.  Continue to ck his pvr's.  RTC : [facility] [place].   Mikaelah Trostle I 11/18/2012, 8:22 AM

## 2012-11-18 NOTE — Progress Notes (Signed)
CSW followed up with pt and pt wife in regard to response from [facility].  Pt was sleeping comfortably at this time.  CSW notified pt wife that [facility] is able to offer pt a bed.  Pt wife accepting of bed offer and very relieved to have pt spouse and pt daughter at same facility.  CSW notified [employer] of acceptance of bed offer.  CSW to facilitate pt discharge needs when pt medically stable for discharge.  Jacklynn Lewis, MSW, LCSWA  Clinical Social Work [phone]

## 2012-11-18 NOTE — Progress Notes (Addendum)
Unable to open pt. Artificial urinary sphincter, pt/ bladder scanned-350mL present. Urology nurse assisted with no success. On call paged regarding inability to open artificial urinary sphincter, On call had no success, and called Urology MD on call for further instructions. Orders received.

## 2012-11-18 NOTE — Progress Notes (Signed)
Event: Notified by RN that she has been unable to get pt's artificial urinary sphincter to function properly. She has requested the assistance of several  RN's from the urology unit and no one has been able to get the sphincter to open as it should to allow urine to pass. Bladder scan shows >350cc. Subjective: Pt remains very confused as he has been since admission. Unable to provide specific information. Objective: MOMEN HAM is an 79 y.o. male with a PMH of metastatic prostate cancer, on Lupron injections, last injection 10/29/12 who developed left sided hip pain after his injection. He was evaluated as an outpatient with an MRI of the thoracic and lumbar spine on 11/08/2012 (positive for a remote T4 vertebral body compression deformity, and posterior disc bulges and facet hypertrophy resulting in right greater than left foraminal narrowing at L3-L4, L4-L5, and L5-L6, advanced on the right at L4-L5 and L5-L6) and subsequently was diagnosed with severe sciatica. He was admitted to the hospital on 11/16/2012 with intractable left hip pain, confusion, and gait instability. At bedside pt noted to be pleasantly confused but otherwise in no acute distress. Recent VS, T-98, BP-144/62, P-63, R-20 w/ 02 sast of 98% on R/A. Lower abd area over suprapubic region is firm to palpation.  Assessment/Plan: 1. Urinary retention: Unable to get pt's artificial urinary sphincter device to open and allow flow of urine. Bladder scan reveals >350cc. Discussed pt w/ Dr Patsi Sears w/ urology service. He requests a foley be placed. He will see pt in am. RN placed foley and returned 500 cc yellow urine. Will continue to monitor closely.  Leanne Chang, NP-C [facility] Pager [phone]

## 2012-11-19 ENCOUNTER — Inpatient Hospital Stay (HOSPITAL_COMMUNITY): Payer: Medicare Other

## 2012-11-19 DIAGNOSIS — G92 Toxic encephalopathy: Principal | ICD-10-CM

## 2012-11-19 DIAGNOSIS — G929 Unspecified toxic encephalopathy: Principal | ICD-10-CM

## 2012-11-19 DIAGNOSIS — E871 Hypo-osmolality and hyponatremia: Secondary | ICD-10-CM

## 2012-11-19 LAB — CBC
HCT: 29.7 % — ABNORMAL LOW (ref 39.0–52.0)
Hemoglobin: 10.3 g/dL — ABNORMAL LOW (ref 13.0–17.0)
MCV: 95.5 fL (ref 78.0–100.0)
WBC: 9 10*3/uL (ref 4.0–10.5)

## 2012-11-19 LAB — GLUCOSE, CAPILLARY
Glucose-Capillary: 56 mg/dL — ABNORMAL LOW (ref 70–99)
Glucose-Capillary: 86 mg/dL (ref 70–99)
Glucose-Capillary: 215 mg/dL — ABNORMAL HIGH (ref 70–99)

## 2012-11-19 LAB — BASIC METABOLIC PANEL
BUN: 32 mg/dL — ABNORMAL HIGH (ref 6–23)
CO2: 27 mEq/L (ref 19–32)
Chloride: 103 mEq/L (ref 96–112)
Creatinine, Ser: 1.29 mg/dL (ref 0.50–1.35)
Glucose, Bld: 64 mg/dL — ABNORMAL LOW (ref 70–99)
Potassium: 3.7 mEq/L (ref 3.5–5.1)

## 2012-11-19 MED ORDER — GADOBENATE DIMEGLUMINE 529 MG/ML IV SOLN
15.0000 mL | Freq: Once | INTRAVENOUS | Status: AC | PRN
  Administered 2012-11-19: 15 mL via INTRAVENOUS

## 2012-11-19 NOTE — Progress Notes (Signed)
CSW continuing to follow for discharge planning to [facility] and Rehab.  Per RN,pt not yet medically ready for discharge.  CSW updated [facility].  CSW updated pt spouse at bedside and provided support.  CSW to continue to follow and facilitate pt discharge needs to [facility] when pt medically stable for discharge.  Jacklynn Lewis, MSW, LCSWA  Clinical Social Work [phone]

## 2012-11-19 NOTE — Progress Notes (Addendum)
No urine output, Bladder scan-284mL, on call notified.

## 2012-11-19 NOTE — Progress Notes (Signed)
Physical Therapy Treatment Patient Details Name: Guy [name] MRN: [id number] DOB: 1919-12-10 Today's Date: 11/19/2012 Time: 1478-2956 PT Time Calculation (min): 28 min  PT Assessment / Plan / Recommendation Comments on Treatment Session  pt has been premedicated by nursing and better able to tolerate acitivty today. He still is limited in sitting tolerance by pain. Expect continued slow progress, but he will be able to particpate with PT at SNF    Follow Up Recommendations  SNF     Does the patient have the potential to tolerate intense rehabilitation     Barriers to Discharge        Equipment Recommendations  Rolling walker with 5" wheels    Recommendations for Other Services    Frequency Min 4X/week   Plan Discharge plan remains appropriate;Frequency remains appropriate    Precautions / Restrictions     Pertinent Vitals/Pain Pt c/o pain in back with activity that increases with time in sitting.  Can only tolerate sitting for 15 minutes    Mobility  Bed Mobility Bed Mobility: Supine to Sit Supine to Sit: 2: Max assist Details for Bed Mobility Assistance: pt tried to initiate moving legs to get edge of bed, but needed assist by moving hips on pad and moving shoulders into sitting upright Transfers Transfers: Stand to [employer] Transfers;Sit to Stand Sit to Stand: 3: Mod assist;1: +2 Total assist Sit to Stand: Patient Percentage: 50% Stand to Sit: 3: Mod assist Stand Pivot Transfers: 2: Max assist Transfer via Lift Equipment: Stedy Details for Transfer Assistance: pt with pain in back on standing.  Tranferred back to bed with use of stedy due to pt pain and fatigue Ambulation/Gait Ambulation/Gait Assistance: Not tested (comment) General Gait Details: pt unable to stand erect, weight shift and step Stairs: No Wheelchair Mobility Wheelchair Mobility: No    Exercises General Exercises - Upper Extremity Shoulder Flexion: AROM;Both;10 reps General Exercises -  Lower Extremity Ankle Circles/Pumps: AROM;10 reps;Seated Long Arc Quad: AROM;10 reps;Seated Hip Flexion/Marching: AROM;Right;10 reps   PT Diagnosis:    PT Problem List:   PT Treatment Interventions:     PT Goals Acute Rehab PT Goals PT Goal Formulation: With patient Time For Goal Achievement: 12/01/12 Potential to Achieve Goals: Fair Pt will Roll Supine to Right Side: with modified independence Pt will Roll Supine to Left Side: with modified independence Pt will go Supine/Side to Sit: with min assist PT Goal: Supine/Side to Sit - Progress: Progressing toward goal Pt will Sit at Edge of Bed: with modified independence;6-10 min Pt will go Sit to Supine/Side: with min assist Pt will Transfer Bed to Chair/Chair to Bed: with min assist PT Transfer Goal: Bed to Chair/Chair to Bed - Progress: Progressing toward goal  Visit Information  Last PT Received On: 11/19/12    Subjective Data  Subjective: It starting to get sore pt reports after sitting in chair for about 15 minutes Patient Stated Goal: pt hopes to go to [employer]  Cognition Overall Cognitive Status: Appears within functional limits for tasks assessed/performed Arousal/Alertness: Awake/alert Orientation Level: Appears intact for tasks assessed Behavior During Session: [facility] for tasks performed    Balance Static Sitting Balance Static Sitting - Balance Support: No upper extremity supported;Feet supported Static Sitting - Level of Assistance: 5: Stand by assistance  End of Session PT - End of Session Activity Tolerance: Patient limited by pain Patient left: in bed;with call bell/phone within reach;with nursing in room Nurse Communication: Mobility status;Need for lift equipment  GP    Bayard Hugger. [place], [place] [phone] 11/19/2012, 4:24 PM

## 2012-11-19 NOTE — Progress Notes (Signed)
[facility] PROGRESS NOTE  Guy [name] WUJ:[id number] DOB: [birth year] DOA: 11/14/2012 PCP: Junious Silk, MD  Assessment/Plan:  Encephalopathy, toxic secondary to pain medication  -Patient with acute encephalopathy that is likely from pain medications although brain metastasis given his metastatic prostate cancer also in the differential.  -CT of the head negative for metastatic disease.  -Improved. -Will try to limit narcotic.   Intractable pain, left hip/ back pain:  -This could be sciatica but given his history of metastatic prostate cancer, worrisome for bone involvement. He has had recent outpatient MRI scans of his lumbar and thoracic spine which did not show any evidence of metastatic deposits.  -Followup bone scan demonstrates activity in L 3. Would defer a PET scan for now, which can be done as an outpatient.  -We'll try to manage pain with nonsteroidal anti-inflammatories and low-dose narcotics.  - prednisone taper.  -Dr Merryl Hacker with Dr. Turner Daniels 2-18, who reviewed all the patient's studies and examined him in the interim. He feels that his symptoms may be from L4-L5 and L5-L6 foraminal narrowing and spoke with him about having IR do an epidural steroid injection.  -Dr Bonnielee Haff with IR was recommending to obtain MRI imagine from [facility]. The nurse request CD 2-19  to [facility]. They will send it by mail.  -X ray lumbar spine : Findings are worrisome for L3 compression deformity. Correlation  with outpatient MRI is recommended.  -Patient relates improvement of pain. I will ask PT evaluation.  -Will discuss care with Dr Turner Daniels.  Gait instability  -Physical and occupational therapy evaluations requested and are pending.  SNF at time of discharge.  Hyponatremia  -Maybe secondary to dehydration. We'll gently hydrate. Resolved with IV fluids.   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  -bone scan  result below. Alkaline phosphatase levels are elevated which would be consistent with metastatic bone disease.  -Continue Casodex.   DM (diabetes mellitus)  -Continue Amaryl, 16 units of Lantus each bedtime, and moderate scale SSI. CBGs 107-148.  -Hypoglycemia, will decrease lantus to 14 units.   Leukocytosis, unspecified  -No obvious infectious etiology. UA done on 11/14/2012 negative for nitrites and leukocytes. CXR done 11/14/12 negative for PNA.  -Follow trend. Resolved.  Normocytic anemia  -Likely anemia of chronic disease.  B12 level at 840.  Dyspnea  -Likely from an enlarging pulmonary metastasis. Supplemental oxygen as needed for oxygen saturation less than 92%.  Ascites  -no significant ascites. Unable to perform diagnostic paracentesis.   Urinary retention with Artificial Urinary Sphincter; no urine out put overnight. Nurse to perform bladder scan. Foley catheter was removed. Nurse will contact urology.    Code Status: DNR Family Communication: Spoke with  Disposition Plan: SNF when stable.    Consultants: Dr. Turner Daniels, Orthopedics Dr Jennette Dubin.   Procedures:  Paracentesis. Pending.  Bone scan: Increased activity within the mid to lower lumbar spine, likely L3.  Cannot exclude compression fracture. Recommend further evaluation  with lumbar spine series.  Subtle focal increased activity at the tip of the left greater  trochanter. Given the location and appearance on the plain films,  this could be related to enthesopathic changes.  Antibiotics:  none  HPI/Subjective: Pain 7/10 hip  and back. He refer mw to speak with wife regarding injection.   Objective: Filed Vitals:   11/18/12 0949 11/18/12 2045 11/18/12 2110 11/19/12 0501  BP: 148/61 139/66  154/89  Pulse: 52 55 64 59  Temp:  98.7 F (37.1 C)  98.6 F (37 C)  TempSrc:  Oral  Oral  Resp:  18  18  SpO2:  97%  100%    Intake/Output Summary (Last 24 hours) at 11/19/12 1440 Last data filed at 11/19/12  0805  Gross per 24 hour  Intake    220 ml  Output    300 ml  Net    -80 ml   There were no vitals filed for this visit.  Exam:   General:  No distress.  Cardiovascular: S 1, S 2 RRR  Respiratory: CTA  Abdomen: Mild distended, NR.   Data Reviewed: Basic Metabolic Panel:  Recent Labs Lab 11/13/12 1534 11/14/12 1040 11/16/12 1535 11/17/12 0400 11/18/12 1155 11/19/12 0351  NA 133* 133*  --  132* 135 136  K 3.7 4.1  --  3.8 3.6 3.7  CL 96 95*  --  95* 100 103  CO2 27 30  --  28 27 27   GLUCOSE 118* 194*  --  109* 132* 64*  BUN 20 27*  --  26* 29* 32*  CREATININE 1.03 1.12 1.31 1.18 1.30 1.29  CALCIUM 9.4 9.7  --  8.9 8.5 8.4   Liver Function Tests:  Recent Labs Lab 11/17/12 0400  AST 40*  ALT 45  ALKPHOS 208*  BILITOT 0.5  PROT 6.3  ALBUMIN 2.5*   No results found for this basename: LIPASE, AMYLASE,  in the last 168 hours  Recent Labs Lab 11/17/12 0408  AMMONIA 33   CBC:  Recent Labs Lab 11/14/12 1040 11/16/12 1535 11/17/12 0400 11/18/12 1155 11/19/12 0351  WBC 12.0* 14.2* 11.0* 6.8 9.0  NEUTROABS 10.3*  --   --   --   --   HGB 12.5* 11.7* 11.1* 9.5* 10.3*  HCT 36.5* 34.4* 30.9* 28.1* 29.7*  MCV 96.3 95.8 95.1 95.9 95.5  PLT 183 206 166 125* 152   Cardiac Enzymes: No results found for this basename: CKTOTAL, CKMB, CKMBINDEX, TROPONINI,  in the last 168 hours BNP (last 3 results) No results found for this basename: PROBNP,  in the last 8760 hours CBG:  Recent Labs Lab 11/18/12 1712 11/18/12 2033 11/19/12 0746 11/19/12 0825 11/19/12 1152  GLUCAP 124* 163* 56* 86 106*    No results found for this or any previous visit (from the past 240 hour(s)).   Studies: Dg Lumbar Spine Complete  11/18/2012  *RADIOLOGY REPORT*  Clinical Data: Back pain  LUMBAR SPINE - COMPLETE 4+ VIEW  Comparison: 04/18/2011  Findings: Numbering of the vertebral bodies is based on the previous MRI from 04/18/2011.  There is a new compression fracture of L3  primarily involving the inferior endplate.  There is also suspected to be an inferior end plate depression of L4 which is subtle.  L1, L2, and L5 are intact. Advanced degenerative changes in the facet joints of the mid and lower lumbar spine are noted.  Severe osteopenia.  No pars defect.  IMPRESSION: Findings are worrisome for L3 compression deformity.  Correlation with outpatient MRI is recommended.   Original Report Authenticated By: Jolaine Click, M.D.    US Abdomen Limited  11/18/2012  *RADIOLOGY REPORT*  Clinical Data: Evaluate for ascites.  LIMITED ABDOMINAL ULTRASOUND  Comparison:  None.  Findings: Ultrasound is performed of the abdomen to evaluate for drainable ascites.  No significant ascites identified.  IMPRESSION: Exam is negative for ascites.   Original Report Authenticated By: Norva Pavlov, M.D.    Ir Fluoro Rm 30-60 Min  11/18/2012  *RADIOLOGY REPORT*  Clinical Data: Back pain and left hip pain  IR FLOURO RM 0-60 MIN  Comparison: None.  Findings: The patient was referred for L4-5 corticosteroid injection.  Fluoroscopic examination demonstrates a suspected L3 compression fracture.  The corticosteroid injection was not performed.  IMPRESSION: L3 vertebral compression fracture is suspected.  Plain radiographs of the lumbar spine will follow this examination.  Please note that this numbering scheme is based on the recent MRI from our facility dated 04/18/2011. I believe this is a different numbering scheme than on the recent MRI performed in [facility].   Original Report Authenticated By: Jolaine Click, M.D.     Scheduled Meds: . aspirin EC  81 mg Oral QPM  . atorvastatin  40 mg Oral q1800  . bicalutamide  50 mg Oral Daily  . calcium-vitamin D  1 tablet Oral BID  . cycloSPORINE  1 drop Both Eyes BID  . docusate sodium  100 mg Oral BID  . gabapentin  100 mg Oral TID  . glimepiride  4 mg Oral QAC breakfast  . insulin aspart  0-15 Units Subcutaneous TID WC  . insulin aspart  0-5 Units  Subcutaneous QHS  . insulin glargine  14 Units Subcutaneous QHS  . losartan  50 mg Oral Daily  . metoprolol  25 mg Oral BID  . multivitamin with minerals  1 tablet Oral Daily  . oxybutynin  5 mg Oral QHS  . pantoprazole  40 mg Oral Daily  . polyethylene glycol  17 g Oral Daily  . predniSONE  50 mg Oral Q breakfast  . sertraline  200 mg Oral QHS   Continuous Infusions: . sodium chloride 75 mL/hr (11/19/12 1042)    Active Problems:   Encephalopathy, toxic secondary to pain medication   Intractable pain, left hip   Gait instability   Hyponatremia   Prostate cancer metastatic to multiple sites   DM (diabetes mellitus)   Dehydration   Leukocytosis, unspecified   Normocytic anemia   Dyspnea   Ascites    Time spent: 25 minutes    Margaretha Mahan  [facility] Pager [phone]. If 8PM-8AM, please contact night-coverage at [email or web address], password [facility] 11/19/2012, 2:40 PM  LOS: 5 days

## 2012-11-20 ENCOUNTER — Encounter (HOSPITAL_COMMUNITY): Payer: Self-pay | Admitting: Radiology

## 2012-11-20 LAB — GLUCOSE, CAPILLARY
Glucose-Capillary: 80 mg/dL (ref 70–99)
Glucose-Capillary: 103 mg/dL — ABNORMAL HIGH (ref 70–99)
Glucose-Capillary: 152 mg/dL — ABNORMAL HIGH (ref 70–99)
Glucose-Capillary: 167 mg/dL — ABNORMAL HIGH (ref 70–99)

## 2012-11-20 MED ORDER — VANCOMYCIN HCL IN DEXTROSE 1-5 GM/200ML-% IV SOLN
1000.0000 mg | INTRAVENOUS | Status: AC
  Administered 2012-11-23: 1000 mg via INTRAVENOUS
  Filled 2012-11-20: qty 200

## 2012-11-20 NOTE — Progress Notes (Signed)
OT Cancellation Note  Patient Details Name: Guy [name] MRN: [id number] DOB: 1919-10-06   Cancelled Treatment:     Checked back with pt after he was medicated, but he did not feel he could attempt to sit eob again.  Will reattempt on another day.    Ceria Suminski 11/20/2012, 11:40 AM Marica Otter, OTR/L [phone] 11/20/2012

## 2012-11-20 NOTE — Progress Notes (Signed)
Subjective: Patient reports back pain.  He states he has not been able to void and that he was I/O cathed at 6am.  His foley was removed yesterday and the 2 PVRs that are documented are 0 and . He currently has an order for q6 prn I/O cath. His artificial sphincter is inactivated.   Objective: Vital signs in last 24 hours: Temp:  [99 F (37.2 C)-99.1 F (37.3 C)] 99 F (37.2 C) (02/21 0600) Pulse Rate:  [52-56] 53 (02/21 0600) Resp:  [18] 18 (02/21 0600) BP: (135-141)/(52-66) 135/59 mmHg (02/21 0600) SpO2:  [97 %-98 %] 98 % (02/21 0600)  Intake/Output from previous day: 02/20 0701 - 02/21 0700 In: 300 [P.O.:300] Out: 1350 [Urine:1350] Intake/Output this shift:    Physical Exam:  General:alert and cooperative GI: soft, non tender, no bladder distention palp. GU: penile meatus with no evidence of erosion or discharge    Lab Results:  Recent Labs  11/18/12 1155 11/19/12 0351  HGB 9.5* 10.3*  HCT 28.1* 29.7*   BMET  Recent Labs  11/18/12 1155 11/19/12 0351  NA 135 136  K 3.6 3.7  CL 100 103  CO2 27 27  GLUCOSE 132* 64*  BUN 29* 32*  CREATININE 1.30 1.29  CALCIUM 8.5 8.4   No results found for this basename: LABPT, INR,  in the last 72 hours No results found for this basename: LABURIN,  in the last 72 hours Results for orders placed during the hospital encounter of 08/01/11  WOUND CULTURE     Status: None   Collection Time    08/02/11  8:19 AM      Result Value Range Status   Specimen Description WOUND PERINEUM   Final   Special Requests IMMUNE:NORM   Final   Gram Stain     Final   Value: ABUNDANT WBC PRESENT, PREDOMINANTLY PMN     FEW SQUAMOUS EPITHELIAL CELLS PRESENT     MODERATE GRAM POSITIVE COCCI IN PAIRS AND CHAINS   Culture     Final   Value: MULTIPLE ORGANISMS PRESENT, NONE PREDOMINANT     Note: NO STAPHYLOCOCCUS AUREUS ISOLATED NO GROUP A STREP (S.PYOGENES) ISOLATED   Report Status 08/04/2011 FINAL   Final  SURGICAL PCR SCREEN      Status: None   Collection Time    08/03/11  5:37 AM      Result Value Range Status   MRSA, PCR NEGATIVE  NEGATIVE Final   Staphylococcus aureus NEGATIVE  NEGATIVE Final   Comment:            The Xpert SA Assay (FDA     approved for NASAL specimens     only), is one component of     a comprehensive surveillance     program.  It is not intended     to diagnose infection nor to     guide or monitor treatment.    Studies/Results: Dg Lumbar Spine Complete  11/18/2012  *RADIOLOGY REPORT*  Clinical Data: Back pain  LUMBAR SPINE - COMPLETE 4+ VIEW  Comparison: 04/18/2011  Findings: Numbering of the vertebral bodies is based on the previous MRI from 04/18/2011.  There is a new compression fracture of L3 primarily involving the inferior endplate.  There is also suspected to be an inferior end plate depression of L4 which is subtle.  L1, L2, and L5 are intact. Advanced degenerative changes in the facet joints of the mid and lower lumbar spine are noted.  Severe osteopenia.  No pars  defect.  IMPRESSION: Findings are worrisome for L3 compression deformity.  Correlation with outpatient MRI is recommended.   Original Report Authenticated By: Jolaine Click, M.D.    Mr Lumbar Spine W Wo Contrast  11/20/2012  *RADIOLOGY REPORT*  Clinical Data: Prostate cancer.  Compression fracture.  Back pain.  MRI LUMBAR SPINE WITHOUT AND WITH CONTRAST  Technique:  Multiplanar and multiecho pulse sequences of the lumbar spine were obtained without and with intravenous contrast.  Contrast: 15mL MULTIHANCE GADOBENATE DIMEGLUMINE 529 MG/ML IV SOLN  Comparison: Radiography 11/18/2012.  MRI 04/18/2011.  Findings: There is an acute or subacute compression fracture at L3 with loss of height of 40%.  There is edema and enhancement of the vertebral body.  The pattern is most consistent with a benign osteoporotic fracture.  There is no specific finding to suggest underlying metastatic disease.  There are no other regional marrow space  abnormalities.  At the L3 level, there is no retropulsed bone.  There is curvature convex to the right with the apex at L3.  There is mild narrowing of the lateral recesses at L2-3 because of bulging of the disc.  At L3-4, the disc bulges moderately in a diffuse fashion.  There is facet and ligamentous hypertrophy. There is stenosis of both lateral recesses and neural foramina.  At L4-5, there is bilateral facet arthropathy with anterolisthesis of 2 mm.  There is ligamentous hypertrophy.  There is circumferential bulging of the disc.  There is stenosis of both lateral recesses and neural foramina.  L5-S1 is transitional and unremarkable.  IMPRESSION: Acute or subacute compression fracture at L3 with loss of height of 40%.  No retropulsed bone.  No finding to suggest that this is anything other than a benign osteoporotic fracture.  No other marrow space lesions.  Narrowing of both lateral recesses and neural foramina that could cause neural compression.  This is due to bulging of the disc and facet and ligamentous hypertrophy.  Bilateral lateral recess and foraminal stenosis at L4-5 because of facet arthropathy, 2 mm of anterolisthesis and bulging of the disc.  The degenerative changes are slightly progressive since the previous study but do not show a dramatic change.   Original Report Authenticated By: Paulina Fusi, M.D.    US Abdomen Limited  11/18/2012  *RADIOLOGY REPORT*  Clinical Data: Evaluate for ascites.  LIMITED ABDOMINAL ULTRASOUND  Comparison:  None.  Findings: Ultrasound is performed of the abdomen to evaluate for drainable ascites.  No significant ascites identified.  IMPRESSION: Exam is negative for ascites.   Original Report Authenticated By: Norva Pavlov, M.D.    Ir Fluoro Rm 30-60 Min  11/18/2012  *RADIOLOGY REPORT*  Clinical Data: Back pain and left hip pain  IR FLOURO RM 0-60 MIN  Comparison: None.  Findings: The patient was referred for L4-5 corticosteroid injection.  Fluoroscopic  examination demonstrates a suspected L3 compression fracture.  The corticosteroid injection was not performed.  IMPRESSION: L3 vertebral compression fracture is suspected.  Plain radiographs of the lumbar spine will follow this examination.  Please note that this numbering scheme is based on the recent MRI from our facility dated 04/18/2011. I believe this is a different numbering scheme than on the recent MRI performed in [facility].   Original Report Authenticated By: Jolaine Click, M.D.     Assessment/Plan:    Allow pt to attempt to void in urinal while laying down q6 hours and prn.  After void attempt check PVR.  If >389ml,  I/O cath.  If <  do not cath.  Repeat process in 1-2 hours.  Want to eliminate as much cathing as possible in attempt to avoid artificial sphincter erosion.    LOS: 6 days   YARBROUGH,Tushar Enns G. 11/20/2012, 8:42 AM

## 2012-11-20 NOTE — Progress Notes (Signed)
Pt. Was offered urinal and he was unable to urinate using the urinal. Bladder scan showed 373 ml of urine. Per order an In and Out cath was done. 340 ml was drained. Post bladder scan showed 34 ml left in bladder.

## 2012-11-20 NOTE — Progress Notes (Signed)
Occupational Therapy Treatment Patient Details Name: Guy [name] MRN: [id number] DOB: [birth year] Today's Date: 11/20/2012 Time: 4098-1191 OT Time Calculation (min): 10 min  OT Assessment / Plan / Recommendation Comments on Treatment Session Attempted adls at eob but pt could not tolerate.  Pain medications brought in.  If time permits, will return later today.      Follow Up Recommendations  SNF    Barriers to Discharge       Equipment Recommendations       Recommendations for Other Services    Frequency Min 2X/week   Plan      Precautions / Restrictions Precautions Precautions: Back Restrictions Weight Bearing Restrictions: No   Pertinent Vitals/Pain Initially mild pain supine in bed.  EOB pain high, repositioned and RN brought pain meds    ADL  Transfers/Ambulation Related to ADLs: tolerated sitting eob less than 1 minute due to back pain ADL Comments: attempted adls at eob, pt unable to tolerate.  Returned to supine and RN brought pain medication.  Repositioned legs up in bed and HOB up to about 30 for breakfast.  Pt has hip pain when HOB raised too high    OT Diagnosis:    OT Problem List:   OT Treatment Interventions:     OT Goals Acute Rehab OT Goals Time For Goal Achievement: 12/01/12 Miscellaneous OT Goals Miscellaneous OT Goal #1: pt will tolerate sitting eob for ub adls with min guard x 5 minutes OT Goal: Miscellaneous Goal #1 - Progress: Other (comment) (did not tolerate due to pain)  Visit Information  Last OT Received On: 11/20/12 Assistance Needed: +2 (1 eob)    Subjective Data      Prior Functioning       Cognition  Cognition Overall Cognitive Status: Appears within functional limits for tasks assessed/performed Arousal/Alertness: Awake/alert Orientation Level: Appears intact for tasks assessed Behavior During Session: [facility] for tasks performed    Mobility  Bed Mobility Rolling Right: 3: Mod assist;With rail Right Sidelying to Sit: 2:  Max assist Details for Bed Mobility Assistance: cues for arms to avoid twisting.  Assist with legs off bed and trunk up    Exercises      Balance     End of Session OT - End of Session Activity Tolerance: Patient limited by pain Patient left: in bed;with call bell/phone within reach  GO     [facility] 11/20/2012, 9:38 AM Marica Otter, OTR/L [phone] 11/20/2012

## 2012-11-20 NOTE — Progress Notes (Signed)
Pt. Was offered the urinal to use. He was unable to urinate using the urinal. An in and out cath was done 375cc of urine was collected.

## 2012-11-20 NOTE — Progress Notes (Addendum)
[facility] PROGRESS NOTE  Guy [name] HKV:[id number] DOB: Aug 25, 1920 DOA: 11/14/2012 PCP: Junious Silk, MD  Assessment/Plan:  Encephalopathy, toxic secondary to pain medication  -Patient with acute encephalopathy that is likely from pain medications although brain metastasis given his metastatic prostate cancer also in the differential.  -CT of the head negative for metastatic disease.  -Improved. -Will try to limit narcotic.   Intractable pain, left hip/ back pain:  -This could be sciatica but given his history of metastatic prostate cancer, worrisome for bone involvement. He has had recent outpatient MRI scans of his lumbar and thoracic spine which did not show any evidence of metastatic deposits.  -Followup bone scan demonstrates activity in L 3. Would defer a PET scan for now, which can be done as an outpatient.  -We'll try to manage pain with nonsteroidal anti-inflammatories and low-dose narcotics.  - prednisone taper.  -X ray lumbar spine : Findings are worrisome for L3 compression deformity. -patient was not able to ambulate with PT . He is still complaining of back, left hip pain. Pain is ok if he doesn't move. MRI show new L 3 compression fracture. I called DR Turner Daniels he will review MRI, and inform us further plan.   Gait instability  -Physical and occupational therapy evaluations requested and are pending.  SNF at time of discharge.  Hyponatremia  -Maybe secondary to dehydration. We'll gently hydrate. Resolved with IV fluids.   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  -bone scan result below. Alkaline phosphatase levels are elevated which would be consistent with metastatic bone disease.  -Continue Casodex.   DM (diabetes mellitus)  -Continue Amaryl, , and moderate scale SSI.   - lantus to 14 units.   Leukocytosis, unspecified  -No obvious infectious etiology. UA done on 11/14/2012  negative for nitrites and leukocytes. CXR done 11/14/12 negative for PNA.  -Follow trend. Resolved.  Normocytic anemia  -Likely anemia of chronic disease.  B12 level at 840.  Dyspnea  -Likely from an enlarging pulmonary metastasis. Supplemental oxygen as needed for oxygen saturation less than 92%.   Ascites  -no significant ascites. Unable to perform diagnostic paracentesis.   Urinary retention with Artificial Urinary Sphincter; urology recommend voiding attempts and  in and out cath for urine retention. Appreciate urology help.   Code Status: DNR Family Communication: Spoke with patient. Disposition Plan: SNF when stable.    Consultants: Dr. Turner Daniels, Orthopedics Dr Jennette Dubin.   Procedures:  Paracentesis. Pending.  Bone scan: Increased activity within the mid to lower lumbar spine, likely L3.  Cannot exclude compression fracture. Recommend further evaluation  with lumbar spine series.  Subtle focal increased activity at the tip of the left greater  trochanter. Given the location and appearance on the plain films,  this could be related to enthesopathic changes.  Antibiotics:  none  HPI/Subjective: Pain 7/10 hip  and back. He refer mw to speak with wife regarding injection.   Objective: Filed Vitals:   11/19/12 2100 11/19/12 2146 11/20/12 0600 11/20/12 0926  BP:  140/66 135/59 161/56  Pulse:  56 53 59  Temp: 99 F (37.2 C) 99 F (37.2 C) 99 F (37.2 C)   TempSrc:  Oral Oral   Resp:  18 18   SpO2:  98% 98%     Intake/Output Summary (Last 24 hours) at 11/20/12 0956 Last data filed at 11/20/12 0600  Gross per 24 hour  Intake    180 ml  Output   1350 ml  Net  -1170 ml   There were no vitals filed for this visit.  Exam:   General:  No distress.  Cardiovascular: S 1, S 2 RRR  Respiratory: CTA  Abdomen: Mild distended, NR.   Data Reviewed: Basic Metabolic Panel:  Recent Labs Lab 11/13/12 1534 11/14/12 1040 11/16/12 1535 11/17/12 0400  11/18/12 1155 11/19/12 0351  NA 133* 133*  --  132* 135 136  K 3.7 4.1  --  3.8 3.6 3.7  CL 96 95*  --  95* 100 103  CO2 27 30  --  28 27 27   GLUCOSE 118* 194*  --  109* 132* 64*  BUN 20 27*  --  26* 29* 32*  CREATININE 1.03 1.12 1.31 1.18 1.30 1.29  CALCIUM 9.4 9.7  --  8.9 8.5 8.4   Liver Function Tests:  Recent Labs Lab 11/17/12 0400  AST 40*  ALT 45  ALKPHOS 208*  BILITOT 0.5  PROT 6.3  ALBUMIN 2.5*   No results found for this basename: LIPASE, AMYLASE,  in the last 168 hours  Recent Labs Lab 11/17/12 0408  AMMONIA 33   CBC:  Recent Labs Lab 11/14/12 1040 11/16/12 1535 11/17/12 0400 11/18/12 1155 11/19/12 0351  WBC 12.0* 14.2* 11.0* 6.8 9.0  NEUTROABS 10.3*  --   --   --   --   HGB 12.5* 11.7* 11.1* 9.5* 10.3*  HCT 36.5* 34.4* 30.9* 28.1* 29.7*  MCV 96.3 95.8 95.1 95.9 95.5  PLT 183 206 166 125* 152   Cardiac Enzymes: No results found for this basename: CKTOTAL, CKMB, CKMBINDEX, TROPONINI,  in the last 168 hours BNP (last 3 results) No results found for this basename: PROBNP,  in the last 8760 hours CBG:  Recent Labs Lab 11/19/12 0825 11/19/12 1152 11/19/12 1856 11/19/12 2144 11/20/12 0741  GLUCAP 86 106* 178* 215* 80    No results found for this or any previous visit (from the past 240 hour(s)).   Studies: Dg Lumbar Spine Complete  11/18/2012  *RADIOLOGY REPORT*  Clinical Data: Back pain  LUMBAR SPINE - COMPLETE 4+ VIEW  Comparison: 04/18/2011  Findings: Numbering of the vertebral bodies is based on the previous MRI from 04/18/2011.  There is a new compression fracture of L3 primarily involving the inferior endplate.  There is also suspected to be an inferior end plate depression of L4 which is subtle.  L1, L2, and L5 are intact. Advanced degenerative changes in the facet joints of the mid and lower lumbar spine are noted.  Severe osteopenia.  No pars defect.  IMPRESSION: Findings are worrisome for L3 compression deformity.  Correlation with  outpatient MRI is recommended.   Original Report Authenticated By: Jolaine Click, M.D.    Mr Lumbar Spine W Wo Contrast  11/20/2012  *RADIOLOGY REPORT*  Clinical Data: Prostate cancer.  Compression fracture.  Back pain.  MRI LUMBAR SPINE WITHOUT AND WITH CONTRAST  Technique:  Multiplanar and multiecho pulse sequences of the lumbar spine were obtained without and with intravenous contrast.  Contrast: 15mL MULTIHANCE GADOBENATE DIMEGLUMINE 529 MG/ML IV SOLN  Comparison: Radiography 11/18/2012.  MRI 04/18/2011.  Findings: There is an acute or subacute compression fracture at L3 with loss of height of 40%.  There is edema and enhancement of the vertebral body.  The pattern is most consistent with a benign osteoporotic fracture.  There is no specific finding to suggest underlying metastatic disease.  There are no other regional marrow space abnormalities.  At the L3 level, there is no retropulsed bone.  There is curvature convex to the right with the apex at L3.  There is mild narrowing of the lateral recesses at L2-3 because of bulging of the disc.  At L3-4, the disc bulges moderately in a diffuse fashion.  There is facet and ligamentous hypertrophy. There is stenosis of both lateral recesses and neural foramina.  At L4-5, there is bilateral facet arthropathy with anterolisthesis of 2 mm.  There is ligamentous hypertrophy.  There is circumferential bulging of the disc.  There is stenosis of both lateral recesses and neural foramina.  L5-S1 is transitional and unremarkable.  IMPRESSION: Acute or subacute compression fracture at L3 with loss of height of 40%.  No retropulsed bone.  No finding to suggest that this is anything other than a benign osteoporotic fracture.  No other marrow space lesions.  Narrowing of both lateral recesses and neural foramina that could cause neural compression.  This is due to bulging of the disc and facet and ligamentous hypertrophy.  Bilateral lateral recess and foraminal stenosis at L4-5  because of facet arthropathy, 2 mm of anterolisthesis and bulging of the disc.  The degenerative changes are slightly progressive since the previous study but do not show a dramatic change.   Original Report Authenticated By: Paulina Fusi, M.D.    US Abdomen Limited  11/18/2012  *RADIOLOGY REPORT*  Clinical Data: Evaluate for ascites.  LIMITED ABDOMINAL ULTRASOUND  Comparison:  None.  Findings: Ultrasound is performed of the abdomen to evaluate for drainable ascites.  No significant ascites identified.  IMPRESSION: Exam is negative for ascites.   Original Report Authenticated By: Norva Pavlov, M.D.    Ir Fluoro Rm 30-60 Min  11/18/2012  *RADIOLOGY REPORT*  Clinical Data: Back pain and left hip pain  IR FLOURO RM 0-60 MIN  Comparison: None.  Findings: The patient was referred for L4-5 corticosteroid injection.  Fluoroscopic examination demonstrates a suspected L3 compression fracture.  The corticosteroid injection was not performed.  IMPRESSION: L3 vertebral compression fracture is suspected.  Plain radiographs of the lumbar spine will follow this examination.  Please note that this numbering scheme is based on the recent MRI from our facility dated 04/18/2011. I believe this is a different numbering scheme than on the recent MRI performed in [facility].   Original Report Authenticated By: Jolaine Click, M.D.     Scheduled Meds: . aspirin EC  81 mg Oral QPM  . atorvastatin  40 mg Oral q1800  . bicalutamide  50 mg Oral Daily  . calcium-vitamin D  1 tablet Oral BID  . cycloSPORINE  1 drop Both Eyes BID  . docusate sodium  100 mg Oral BID  . gabapentin  100 mg Oral TID  . glimepiride  4 mg Oral QAC breakfast  . insulin aspart  0-15 Units Subcutaneous TID WC  . insulin aspart  0-5 Units Subcutaneous QHS  . insulin glargine  14 Units Subcutaneous QHS  . losartan  50 mg Oral Daily  . metoprolol  25 mg Oral BID  . multivitamin with minerals  1 tablet Oral Daily  . oxybutynin  5 mg Oral QHS  .  pantoprazole  40 mg Oral Daily  . polyethylene glycol  17 g Oral Daily  . predniSONE  50 mg Oral Q breakfast  . sertraline  200 mg Oral QHS   Continuous Infusions: . sodium chloride 75 mL/hr at 11/20/12 0100    Active Problems:   Encephalopathy, toxic secondary  to pain medication   Intractable pain, left hip   Gait instability   Hyponatremia   Prostate cancer metastatic to multiple sites   DM (diabetes mellitus)   Dehydration   Leukocytosis, unspecified   Normocytic anemia   Dyspnea   Ascites    Time spent: 25 minutes    Jermon Chalfant  [facility] Pager [phone]. If 8PM-8AM, please contact night-coverage at [email or web address], password [facility] 11/20/2012, 9:56 AM  LOS: 6 days       I Spoke with Dr Turner Daniels, he discussed case with one of his partner. We can consider kyphoplasty vs pain management. I spoke with wife and patient they agree to proceed with kyphoplasty. IR consulted. If procedure not able to be done by Monday could also arrange outpatient.

## 2012-11-20 NOTE — H&P (Signed)
HPI: Guy [name] is an 79 y.o. male admitted with hip and back pain. Part of his workup including a bone scan which suggested possible L3 comp fx, but a previous MRI at outside facility did not report this. He was sent to IR for epidural injection, but under fluoroscopy, concern again for L3 compression fracture was noted by Dr. Bonnielee Haff. He has now had repeat MRI which confirms acute L3 comp fracture. IR is now requested to perform VP/KP Pt reports continued back pain in lower lumbar region. PMHX and meds and chart otherwise reviewed, no infectious issues and pt appears hemodynamically stable.  Past Medical History:  Past Medical History  Diagnosis Date  . Hypertension   . Diabetes mellitus without complication   . Arthritis   . Sciatica   . GERD (gastroesophageal reflux disease)   . Bladder cancer   . Urge incontinence   . Prostate cancer     S/P prostatectomy; Lung metastasis  . Vertigo   . Hyperlipidemia   . ED (erectile dysfunction)   . Gastric polyposis   . Osteoporosis   . Decreased libido   . Depression   . Hepatitis A   . CAD (coronary artery disease)   . Stroke   . History of blood transfusion     1946  . OA (osteoarthritis)     Past Surgical History:  Past Surgical History  Procedure Laterality Date  . Coronary angioplasty with stent placement  1996  . Carpal tunnel release    . Cataract extraction w/ intraocular lens  implant, bilateral  1998  . Prostatectomy    . Urinary sphincter implant    . Urinary sphincter implant revision    . Appendectomy    . Left hip surgery      Donated bone for bone graft to arm  . Penile prosthesis implant      S/P removal and re-implantation of new prosthesis  . Middle ear surgery    . Finger surgery      Left and right  . Hernia repair    . Bladder surgery    . Right arm bone graft      Pathological fracture    Family History:  Family History  Problem Relation Age of Onset  . Heart failure Mother   . Bladder  Cancer Father   . Pancreatic cancer Sister   . Lung cancer Sister   . Breast cancer Daughter   . Liver disease Son     Social History:  reports that he has quit smoking. He has never used smokeless tobacco. He reports that he does not drink alcohol or use illicit drugs.  Allergies:  Allergies  Allergen Reactions  . Albuterol Sulfate Hfa (ZOX:[id number]) Shortness Of Breath  . Adhesive (Tape) Other (See Comments)    REACTION: SKIN BLISTERS  . Penicillins Other (See Comments)    REACTION: ITCHING HANDS  . Sulfa Drugs Cross Reactors Hives    Medications: Medications Prior to Admission  Medication Sig Dispense Refill  . acetaminophen (TYLENOL) 325 MG tablet Take 325 mg by mouth daily.       [name] aspirin EC 81 MG tablet Take 81 mg by mouth every evening.       . bicalutamide (CASODEX) 50 MG tablet Take 50 mg by mouth daily.      . Calcium Carbonate-Vitamin D (CALCIUM 600+D HIGH POTENCY) 600-400 MG-UNIT per tablet Take 1 tablet by mouth 2 (two) times daily.        . cycloSPORINE (  RESTASIS) 0.05 % ophthalmic emulsion Place 1 drop into both eyes 2 (two) times daily.        [name] docusate sodium (COLACE) 100 MG capsule Take 100 mg by mouth 2 (two) times daily.       [name] docusate sodium (COLACE) 250 MG capsule Take 1 capsule (250 mg total) by mouth daily.  10 capsule  0  . fluocinonide (LIDEX) 0.05 % cream Apply 1 application topically 2 (two) times daily.        [name] gabapentin (NEURONTIN) 100 MG capsule Take 100 mg by mouth 3 (three) times daily.       [name] glimepiride (AMARYL) 4 MG tablet Take 4 mg by mouth daily before breakfast.        . insulin glargine (LANTUS) 100 UNIT/ML injection Inject 16 Units into the skin at bedtime.       [name] Leuprolide Acetate (LUPRON IJ) Inject 1 each as directed as directed. He receives every 6 months at [facility].      . loratadine (CLARITIN) 10 MG tablet Take 10 mg by mouth daily as needed for allergies.       [name] losartan (COZAAR) 50 MG tablet Take 50 mg  by mouth every morning.        . meclizine (ANTIVERT) 25 MG tablet Take 25 mg by mouth 3 (three) times daily as needed for dizziness.       . metoprolol (LOPRESSOR) 50 MG tablet Take 25 mg by mouth 2 (two) times daily.        . Multiple Vitamins-Minerals (MULTIVITAMINS THER. W/MINERALS) TABS Take 1 tablet by mouth every morning.       . nitroGLYCERIN (NITROSTAT) 0.4 MG SL tablet Place 0.4 mg under the tongue every 5 (five) minutes as needed for chest pain.       [name] omeprazole (PRILOSEC) 20 MG capsule Take 20 mg by mouth 2 (two) times daily.        [name] oxybutynin (DITROPAN) 5 MG tablet Take 5 mg by mouth at bedtime.      [name] oxyCODONE-acetaminophen (PERCOCET) 10-325 MG per tablet Take 1 tablet by mouth every 4 (four) hours as needed for pain.  30 tablet  0  . polyethylene glycol (MIRALAX / GLYCOLAX) packet Take 17 g by mouth daily.  14 each  0  . sertraline (ZOLOFT) 100 MG tablet Take 200 mg by mouth at bedtime.       . simvastatin (ZOCOR) 80 MG tablet Take 40 mg by mouth at bedtime.       . traMADol (ULTRAM) 50 MG tablet Take 50 mg by mouth every 8 (eight) hours as needed for pain.         Please HPI for pertinent positives, otherwise complete 10 system ROS negative.  Physical Exam: Blood pressure 168/61, pulse 64, temperature 98.7 F (37.1 C), temperature source Oral, resp. rate 18, SpO2 96.00%. There is no weight on file to calculate BMI.   General Appearance:  Alert, cooperative, no distress, appears stated age  Head:  Normocephalic, without obvious abnormality, atraumatic  ENT: Unremarkable  Lungs:   Clear to auscultation bilaterally, no w/r/r, respirations unlabored without use of accessory muscles.  Back:  Tenderness in mid low back  Heart:  Regular rate and rhythm, S1, S2 normal, no murmur, rub or gallop. Carotids 2+ without bruit.  Abdomen:   Soft, non-tender, non distended. Bowel sounds active all four quadrants,  no masses, no organomegaly.  Neurologic: Normal affect, no gross  deficits.  Results for orders placed during the hospital encounter of 11/14/12 (from the past 48 hour(s))  GLUCOSE, CAPILLARY     Status: Abnormal   Collection Time    11/18/12  5:12 PM      Result Value Range   Glucose-Capillary 124 (*) 70 - 99 mg/dL  GLUCOSE, CAPILLARY     Status: Abnormal   Collection Time    11/18/12  8:33 PM      Result Value Range   Glucose-Capillary 163 (*) 70 - 99 mg/dL   Comment 1 Notify RN    CBC     Status: Abnormal   Collection Time    11/19/12  3:51 AM      Result Value Range   WBC 9.0  4.0 - 10.5 K/uL   RBC 3.11 (*) 4.22 - 5.81 MIL/uL   Hemoglobin 10.3 (*) 13.0 - 17.0 g/dL   HCT 16.1 (*) 09.6 - 04.5 %   MCV 95.5  78.0 - 100.0 fL   MCH 33.1  26.0 - 34.0 pg   MCHC 34.7  30.0 - 36.0 g/dL   RDW 40.9  81.1 - 91.4 %   Platelets 152  150 - 400 K/uL  BASIC METABOLIC PANEL     Status: Abnormal   Collection Time    11/19/12  3:51 AM      Result Value Range   Sodium 136  135 - 145 mEq/L   Potassium 3.7  3.5 - 5.1 mEq/L   Chloride 103  96 - 112 mEq/L   CO2 27  19 - 32 mEq/L   Glucose, Bld 64 (*) 70 - 99 mg/dL   BUN 32 (*) 6 - 23 mg/dL   Creatinine, Ser 7.82  0.50 - 1.35 mg/dL   Calcium 8.4  8.4 - 95.6 mg/dL   GFR calc non Af Amer 47 (*) >90 mL/min   GFR calc Af Amer 55 (*) >90 mL/min   Comment:            The eGFR has been calculated     using the CKD EPI equation.     This calculation has not been     validated in all clinical     situations.     eGFR's persistently     <90 mL/min signify     possible Chronic Kidney Disease.  GLUCOSE, CAPILLARY     Status: Abnormal   Collection Time    11/19/12  7:46 AM      Result Value Range   Glucose-Capillary 56 (*) 70 - 99 mg/dL   Comment 1 Documented in Chart     Comment 2 Notify RN    GLUCOSE, CAPILLARY     Status: None   Collection Time    11/19/12  8:25 AM      Result Value Range   Glucose-Capillary 86  70 - 99 mg/dL   Comment 1 Documented in Chart     Comment 2 Notify RN    GLUCOSE,  CAPILLARY     Status: Abnormal   Collection Time    11/19/12 11:52 AM      Result Value Range   Glucose-Capillary 106 (*) 70 - 99 mg/dL   Comment 1 Documented in Chart     Comment 2 Notify RN    GLUCOSE, CAPILLARY     Status: Abnormal   Collection Time    11/19/12  6:56 PM      Result Value Range   Glucose-Capillary 178 (*) 70 - 99 mg/dL  Comment 1 Documented in Chart     Comment 2 Notify RN    GLUCOSE, CAPILLARY     Status: Abnormal   Collection Time    11/19/12  9:44 PM      Result Value Range   Glucose-Capillary 215 (*) 70 - 99 mg/dL   Comment 1 Notify RN    GLUCOSE, CAPILLARY     Status: None   Collection Time    11/20/12  7:41 AM      Result Value Range   Glucose-Capillary 80  70 - 99 mg/dL  GLUCOSE, CAPILLARY     Status: Abnormal   Collection Time    11/20/12 12:02 PM      Result Value Range   Glucose-Capillary 103 (*) 70 - 99 mg/dL   Mr Lumbar Spine W Wo Contrast  11/20/2012  *RADIOLOGY REPORT*  Clinical Data: Prostate cancer.  Compression fracture.  Back pain.  MRI LUMBAR SPINE WITHOUT AND WITH CONTRAST  Technique:  Multiplanar and multiecho pulse sequences of the lumbar spine were obtained without and with intravenous contrast.  Contrast: 15mL MULTIHANCE GADOBENATE DIMEGLUMINE 529 MG/ML IV SOLN  Comparison: Radiography 11/18/2012.  MRI 04/18/2011.  Findings: There is an acute or subacute compression fracture at L3 with loss of height of 40%.  There is edema and enhancement of the vertebral body.  The pattern is most consistent with a benign osteoporotic fracture.  There is no specific finding to suggest underlying metastatic disease.  There are no other regional marrow space abnormalities.  At the L3 level, there is no retropulsed bone.  There is curvature convex to the right with the apex at L3.  There is mild narrowing of the lateral recesses at L2-3 because of bulging of the disc.  At L3-4, the disc bulges moderately in a diffuse fashion.  There is facet and ligamentous  hypertrophy. There is stenosis of both lateral recesses and neural foramina.  At L4-5, there is bilateral facet arthropathy with anterolisthesis of 2 mm.  There is ligamentous hypertrophy.  There is circumferential bulging of the disc.  There is stenosis of both lateral recesses and neural foramina.  L5-S1 is transitional and unremarkable.  IMPRESSION: Acute or subacute compression fracture at L3 with loss of height of 40%.  No retropulsed bone.  No finding to suggest that this is anything other than a benign osteoporotic fracture.  No other marrow space lesions.  Narrowing of both lateral recesses and neural foramina that could cause neural compression.  This is due to bulging of the disc and facet and ligamentous hypertrophy.  Bilateral lateral recess and foraminal stenosis at L4-5 because of facet arthropathy, 2 mm of anterolisthesis and bulging of the disc.  The degenerative changes are slightly progressive since the previous study but do not show a dramatic change.   Original Report Authenticated By: Paulina Fusi, M.D.     Assessment/Plan Acute symptomatic L3 compression fracture. Have had Dr. Deanne Coffer review images and feels pt is amenable to vertebral augmentation. Discussed procedure with pt at length, including risks, complications, use of sedation, and expected symptom relief and recovery. Labs reviewed, afebrile. Will check coags. Have asked for insurance pre-authorization for procedure...pending. Plan for procedure on Mon 2/24.   Brayton El PA-C 11/20/2012, 4:00 PM

## 2012-11-21 DIAGNOSIS — R0989 Other specified symptoms and signs involving the circulatory and respiratory systems: Secondary | ICD-10-CM

## 2012-11-21 DIAGNOSIS — R0609 Other forms of dyspnea: Secondary | ICD-10-CM

## 2012-11-21 LAB — GLUCOSE, CAPILLARY
Glucose-Capillary: 68 mg/dL — ABNORMAL LOW (ref 70–99)
Glucose-Capillary: 82 mg/dL (ref 70–99)
Glucose-Capillary: 113 mg/dL — ABNORMAL HIGH (ref 70–99)
Glucose-Capillary: 233 mg/dL — ABNORMAL HIGH (ref 70–99)

## 2012-11-21 LAB — BASIC METABOLIC PANEL
CO2: 26 mEq/L (ref 19–32)
Chloride: 103 mEq/L (ref 96–112)
Creatinine, Ser: 1.26 mg/dL (ref 0.50–1.35)
GFR calc Af Amer: 56 mL/min — ABNORMAL LOW (ref >90)
Potassium: 3.7 mEq/L (ref 3.5–5.1)
Sodium: 135 mEq/L (ref 135–145)

## 2012-11-21 LAB — PROTIME-INR
INR: 1.32 (ref 0.00–1.49)
Prothrombin Time: 16.1 seconds — ABNORMAL HIGH (ref 11.6–15.2)

## 2012-11-21 LAB — APTT: aPTT: 35 seconds (ref 24–37)

## 2012-11-21 MED ORDER — INSULIN GLARGINE 100 UNIT/ML ~~LOC~~ SOLN
14.0000 [IU] | Freq: Every day | SUBCUTANEOUS | Status: DC
  Administered 2012-11-21: 14 [IU] via SUBCUTANEOUS

## 2012-11-21 NOTE — Progress Notes (Signed)
[facility] PROGRESS NOTE  BAO BAZEN ZOX:[id number] DOB: 16-Mar-1920 DOA: 11/14/2012 PCP: Junious Silk, MD  Assessment/Plan:  Encephalopathy, toxic secondary to pain medication : resolved. -Patient with acute encephalopathy that is likely from pain medications although brain metastasis given his metastatic prostate cancer also in the differential.  -CT of the head negative for metastatic disease.  -Will try to limit narcotic.   Intractable pain, left hip/ back pain:  -This could be sciatica but given his history of metastatic prostate cancer, worrisome for bone involvement. He has had recent outpatient MRI scans of his lumbar and thoracic spine which did not show any evidence of metastatic deposits.  -Followup bone scan demonstrates activity in L 3. Would defer a PET scan for now, which can be done as an outpatient.  -We'll try to manage pain with nonsteroidal anti-inflammatories and low-dose narcotics.  - prednisone taper.  -X ray lumbar spine : Findings are worrisome for L3 compression deformity. -patient was not able to ambulate with PT . He is still complaining of back, left hip pain. Pain is ok if he doesn't move. MRI show new L 3 compression fracture. Discussed care with Dr Turner Daniels, recommend KP. IR planning to do KP Monday.  Gait instability  -Physical and occupational therapy evaluations requested and are pending.  SNF at time of discharge.  Hyponatremia  -Maybe secondary to dehydration. We'll gently hydrate. Resolved with IV fluids.   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  -bone scan result below. Alkaline phosphatase levels are elevated which would be consistent with metastatic bone disease.  -Continue Casodex.   DM (diabetes mellitus)  -Continue Amaryl, , and moderate scale SSI.   - lantus to 14 units.   Leukocytosis, unspecified  -No obvious infectious etiology. UA done on 11/14/2012  negative for nitrites and leukocytes. CXR done 11/14/12 negative for PNA.  -Follow trend. Resolved.  Normocytic anemia  -Likely anemia of chronic disease.  B12 level at 840.  Dyspnea  -Likely from an enlarging pulmonary metastasis. Supplemental oxygen as needed for oxygen saturation less than 92%.   Ascites  -no significant ascites. Unable to perform diagnostic paracentesis.   Urinary retention with Artificial Urinary Sphincter; urology recommend voiding attempts and  in and out cath for urine retention. Appreciate urology help.   Code Status: DNR Family Communication: Spoke with patient. Disposition Plan: SNF when stable.    Consultants: Dr. Turner Daniels, Orthopedics Dr Jennette Dubin.   Procedures:  Paracentesis. Pending.  Bone scan: Increased activity within the mid to lower lumbar spine, likely L3.  Cannot exclude compression fracture. Recommend further evaluation  with lumbar spine series.  Subtle focal increased activity at the tip of the left greater  trochanter. Given the location and appearance on the plain films,  this could be related to enthesopathic changes.  Antibiotics:  none  HPI/Subjective: Pain 7/10 hip  and back. Pain is ok if he doesn't move.  Objective: Filed Vitals:   11/20/12 0926 11/20/12 1400 11/20/12 2106 11/21/12 0430  BP: 161/56 168/61 150/61 159/65  Pulse: 59 64 67 61  Temp:  98.7 F (37.1 C) 99 F (37.2 C) 98.6 F (37 C)  TempSrc:  Oral Oral Oral  Resp:  18 16 15   SpO2:  96% 96% 100%    Intake/Output Summary (Last 24 hours) at 11/21/12 1055 Last data filed at 11/21/12 0430  Gross per 24 hour  Intake    460 ml  Output   1090  ml  Net   -630 ml   There were no vitals filed for this visit.  Exam:   General:  No distress.  Cardiovascular: S 1, S 2 RRR  Respiratory: CTA  Abdomen: Mild distended, NR.   Data Reviewed: Basic Metabolic Panel:  Recent Labs Lab 11/16/12 1535 11/17/12 0400 11/18/12 1155 11/19/12 0351 11/21/12 0401   NA  --  132* 135 136 135  K  --  3.8 3.6 3.7 3.7  CL  --  95* 100 103 103  CO2  --  28 27 27 26   GLUCOSE  --  109* 132* 64* 84  BUN  --  26* 29* 32* 25*  CREATININE 1.31 1.18 1.30 1.29 1.26  CALCIUM  --  8.9 8.5 8.4 7.8*   Liver Function Tests:  Recent Labs Lab 11/17/12 0400  AST 40*  ALT 45  ALKPHOS 208*  BILITOT 0.5  PROT 6.3  ALBUMIN 2.5*   No results found for this basename: LIPASE, AMYLASE,  in the last 168 hours  Recent Labs Lab 11/17/12 0408  AMMONIA 33   CBC:  Recent Labs Lab 11/16/12 1535 11/17/12 0400 11/18/12 1155 11/19/12 0351  WBC 14.2* 11.0* 6.8 9.0  HGB 11.7* 11.1* 9.5* 10.3*  HCT 34.4* 30.9* 28.1* 29.7*  MCV 95.8 95.1 95.9 95.5  PLT 206 166 125* 152   Cardiac Enzymes: No results found for this basename: CKTOTAL, CKMB, CKMBINDEX, TROPONINI,  in the last 168 hours BNP (last 3 results) No results found for this basename: PROBNP,  in the last 8760 hours CBG:  Recent Labs Lab 11/20/12 1202 11/20/12 1742 11/20/12 2104 11/21/12 0750 11/21/12 0812  GLUCAP 103* 152* 167* 50* 79    No results found for this or any previous visit (from the past 240 hour(s)).   Studies: Mr Lumbar Spine W Wo Contrast  11/20/2012  *RADIOLOGY REPORT*  Clinical Data: Prostate cancer.  Compression fracture.  Back pain.  MRI LUMBAR SPINE WITHOUT AND WITH CONTRAST  Technique:  Multiplanar and multiecho pulse sequences of the lumbar spine were obtained without and with intravenous contrast.  Contrast: 15mL MULTIHANCE GADOBENATE DIMEGLUMINE 529 MG/ML IV SOLN  Comparison: Radiography 11/18/2012.  MRI 04/18/2011.  Findings: There is an acute or subacute compression fracture at L3 with loss of height of 40%.  There is edema and enhancement of the vertebral body.  The pattern is most consistent with a benign osteoporotic fracture.  There is no specific finding to suggest underlying metastatic disease.  There are no other regional marrow space abnormalities.  At the L3 level,  there is no retropulsed bone.  There is curvature convex to the right with the apex at L3.  There is mild narrowing of the lateral recesses at L2-3 because of bulging of the disc.  At L3-4, the disc bulges moderately in a diffuse fashion.  There is facet and ligamentous hypertrophy. There is stenosis of both lateral recesses and neural foramina.  At L4-5, there is bilateral facet arthropathy with anterolisthesis of 2 mm.  There is ligamentous hypertrophy.  There is circumferential bulging of the disc.  There is stenosis of both lateral recesses and neural foramina.  L5-S1 is transitional and unremarkable.  IMPRESSION: Acute or subacute compression fracture at L3 with loss of height of 40%.  No retropulsed bone.  No finding to suggest that this is anything other than a benign osteoporotic fracture.  No other marrow space lesions.  Narrowing of both lateral recesses and neural foramina that could cause neural  compression.  This is due to bulging of the disc and facet and ligamentous hypertrophy.  Bilateral lateral recess and foraminal stenosis at L4-5 because of facet arthropathy, 2 mm of anterolisthesis and bulging of the disc.  The degenerative changes are slightly progressive since the previous study but do not show a dramatic change.   Original Report Authenticated By: Paulina Fusi, M.D.     Scheduled Meds: . aspirin EC  81 mg Oral QPM  . atorvastatin  40 mg Oral q1800  . bicalutamide  50 mg Oral Daily  . calcium-vitamin D  1 tablet Oral BID  . cycloSPORINE  1 drop Both Eyes BID  . docusate sodium  100 mg Oral BID  . gabapentin  100 mg Oral TID  . glimepiride  4 mg Oral QAC breakfast  . insulin aspart  0-15 Units Subcutaneous TID WC  . insulin aspart  0-5 Units Subcutaneous QHS  . insulin glargine  14 Units Subcutaneous QHS  . losartan  50 mg Oral Daily  . metoprolol  25 mg Oral BID  . multivitamin with minerals  1 tablet Oral Daily  . oxybutynin  5 mg Oral QHS  . pantoprazole  40 mg Oral Daily   . polyethylene glycol  17 g Oral Daily  . predniSONE  50 mg Oral Q breakfast  . sertraline  200 mg Oral QHS  . [START ON 11/23/2012] vancomycin  1,000 mg Intravenous On Call   Continuous Infusions:    Active Problems:   Encephalopathy, toxic secondary to pain medication   Intractable pain, left hip   Gait instability   Hyponatremia   Prostate cancer metastatic to multiple sites   DM (diabetes mellitus)   Dehydration   Leukocytosis, unspecified   Normocytic anemia   Dyspnea   Ascites    Time spent: 25 minutes    Allyana Vogan  [facility] Pager [phone]. If 8PM-8AM, please contact night-coverage at [email or web address], password [facility] 11/21/2012, 10:55 AM  LOS: 7 days

## 2012-11-21 NOTE — Progress Notes (Signed)
Patient ID: Guy [name], male   DOB: Aug 06, 1920, 79 y.o.   MRN: [id number]  Pt c/o back pain. Awaiting KP. Not able to void, but said nurse is not having trouble cathing him. He's not having urgency or incontinence.   I spoke to nurse and they are doing CIC q 6 hrs without difficulty.   BMET    Component Value Date/Time   NA 135 11/21/2012 0401   K 3.7 11/21/2012 0401   CL 103 11/21/2012 0401   CO2 26 11/21/2012 0401   GLUCOSE 84 11/21/2012 0401   BUN 25* 11/21/2012 0401   CREATININE 1.26 11/21/2012 0401   CALCIUM 7.8* 11/21/2012 0401   GFRNONAA 48* 11/21/2012 0401   GFRAA 56* 11/21/2012 0401    Plan -  -incontinence with AUS -Urinary retention -Met PCa  Plan- -continue CIC q 6 hrs or prn -on CAB with casodex and Lupron

## 2012-11-22 LAB — GLUCOSE, CAPILLARY: Glucose-Capillary: 214 mg/dL — ABNORMAL HIGH (ref 70–99)

## 2012-11-22 LAB — URINALYSIS, ROUTINE W REFLEX MICROSCOPIC
Bilirubin Urine: NEGATIVE
Ketones, ur: NEGATIVE mg/dL
Nitrite: POSITIVE — AB
Specific Gravity, Urine: 1.017 (ref 1.005–1.030)
Urobilinogen, UA: 0.2 mg/dL (ref 0.0–1.0)

## 2012-11-22 LAB — URINE MICROSCOPIC-ADD ON

## 2012-11-22 MED ORDER — CEFTRIAXONE SODIUM 1 G IJ SOLR
1.0000 g | INTRAMUSCULAR | Status: DC
  Administered 2012-11-22: 1 g via INTRAVENOUS
  Filled 2012-11-22 (×2): qty 10

## 2012-11-22 MED ORDER — PREDNISONE 20 MG PO TABS
40.0000 mg | ORAL_TABLET | Freq: Every day | ORAL | Status: DC
  Administered 2012-11-23: 40 mg via ORAL
  Filled 2012-11-22 (×2): qty 2

## 2012-11-22 NOTE — Progress Notes (Addendum)
[facility] PROGRESS NOTE  Guy [name] JXB:[id number] DOB: 1919-11-30 DOA: 11/14/2012 PCP: Junious Silk, MD  Assessment/Plan:  Encephalopathy, toxic secondary to pain medication : resolved. -Patient with acute encephalopathy that is likely from pain medications although brain metastasis given his metastatic prostate cancer also in the differential.  -CT of the head negative for metastatic disease.  -limit narcotic.  Intractable pain, left hip/ back pain:  -This could be sciatica but given his history of metastatic prostate cancer, worrisome for bone involvement. He has had recent outpatient MRI scans of his lumbar and thoracic spine which did not show any evidence of metastatic deposits.  -Followup bone scan demonstrates activity in L 3. Would defer a PET scan for now, which can be done as an outpatient.  -We'll try to manage pain with nonsteroidal anti-inflammatories and low-dose narcotics.  - prednisone taper.  -X ray lumbar spine : Findings are worrisome for L3 compression deformity. -patient was not able to ambulate with PT . He is still complaining of back, left hip pain. Pain is ok if he doesn't move. MRI show new L 3 compression fracture. Discussed care with Dr Turner Daniels, recommend KP. IR planning to do KP Monday. Hopefully discharge tomorrow after KP.  Gait instability  -Physical and occupational therapy evaluations requested and are pending.  SNF at time of discharge.  Hyponatremia  -Maybe secondary to dehydration. We'll gently hydrate. Resolved with IV fluids.   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  -bone scan result below. Alkaline phosphatase levels are elevated which would be consistent with metastatic bone disease.  -Continue Casodex.   DM (diabetes mellitus)  -Continue Amaryl, , and moderate scale SSI.   - Hold lantus, patient NPO after midnight.   Leukocytosis, unspecified  -No  obvious infectious etiology. UA done on 11/14/2012 negative for nitrites and leukocytes. CXR done 11/14/12 negative for PNA.  -Follow trend. Resolved.  Normocytic anemia  -Likely anemia of chronic disease.  B12 level at 840.  Dyspnea  -Likely from an enlarging pulmonary metastasis. Supplemental oxygen as needed for oxygen saturation less than 92%.   Ascites  -no significant ascites. Unable to perform diagnostic paracentesis.   Urinary retention with Artificial Urinary Sphincter; urology recommend voiding attempts and  in and out cath for urine retention. Appreciate urology help. Check UA.   DVT prophylaxis: SCD.   Code Status: DNR Family Communication: Spoke with patient. Disposition Plan: SNF after KP.    Consultants: Dr. Turner Daniels, Orthopedics Dr Jennette Dubin.   Procedures:  none  Bone scan: Increased activity within the mid to lower lumbar spine, likely L3.  Cannot exclude compression fracture. Recommend further evaluation  with lumbar spine series.  Subtle focal increased activity at the tip of the left greater  trochanter. Given the location and appearance on the plain films,  this could be related to enthesopathic changes.  Antibiotics:  none  HPI/Subjective:  Pain is ok if he doesn't move. No complaints. Had BM.   Objective: Filed Vitals:   11/21/12 0430 11/21/12 1305 11/21/12 2213 11/22/12 0505  BP: 159/65 146/70 153/73 147/67  Pulse: 61 83 61 57  Temp: 98.6 F (37 C) 98.2 F (36.8 C) 98.6 F (37 C) 98.7 F (37.1 C)  TempSrc: Oral Oral Oral Oral  Resp: 15 18 18 16   SpO2: 100% 98% 95% 97%    Intake/Output Summary (Last 24 hours) at 11/22/12 1107 Last data filed at 11/22/12 0700  Gross per 24 hour  Intake    480 ml  Output   1620 ml  Net  -1140 ml   There were no vitals filed for this visit.  Exam:   General:  No distress.  Cardiovascular: S 1, S 2 RRR  Respiratory: CTA  Abdomen: Mild distended, NR.   Data Reviewed: Basic Metabolic  Panel:  Recent Labs Lab 11/16/12 1535 11/17/12 0400 11/18/12 1155 11/19/12 0351 11/21/12 0401  NA  --  132* 135 136 135  K  --  3.8 3.6 3.7 3.7  CL  --  95* 100 103 103  CO2  --  28 27 27 26   GLUCOSE  --  109* 132* 64* 84  BUN  --  26* 29* 32* 25*  CREATININE 1.31 1.18 1.30 1.29 1.26  CALCIUM  --  8.9 8.5 8.4 7.8*   Liver Function Tests:  Recent Labs Lab 11/17/12 0400  AST 40*  ALT 45  ALKPHOS 208*  BILITOT 0.5  PROT 6.3  ALBUMIN 2.5*   No results found for this basename: LIPASE, AMYLASE,  in the last 168 hours  Recent Labs Lab 11/17/12 0408  AMMONIA 33   CBC:  Recent Labs Lab 11/16/12 1535 11/17/12 0400 11/18/12 1155 11/19/12 0351  WBC 14.2* 11.0* 6.8 9.0  HGB 11.7* 11.1* 9.5* 10.3*  HCT 34.4* 30.9* 28.1* 29.7*  MCV 95.8 95.1 95.9 95.5  PLT 206 166 125* 152   Cardiac Enzymes: No results found for this basename: CKTOTAL, CKMB, CKMBINDEX, TROPONINI,  in the last 168 hours BNP (last 3 results) No results found for this basename: PROBNP,  in the last 8760 hours CBG:  Recent Labs Lab 11/21/12 1229 11/21/12 1253 11/21/12 1633 11/21/12 2157 11/22/12 0741  GLUCAP 68* 82 113* 233* 71    No results found for this or any previous visit (from the past 240 hour(s)).   Studies: No results found.  Scheduled Meds: . aspirin EC  81 mg Oral QPM  . atorvastatin  40 mg Oral q1800  . bicalutamide  50 mg Oral Daily  . calcium-vitamin D  1 tablet Oral BID  . cycloSPORINE  1 drop Both Eyes BID  . docusate sodium  100 mg Oral BID  . gabapentin  100 mg Oral TID  . glimepiride  4 mg Oral QAC breakfast  . insulin aspart  0-15 Units Subcutaneous TID WC  . insulin aspart  0-5 Units Subcutaneous QHS  . losartan  50 mg Oral Daily  . metoprolol  25 mg Oral BID  . multivitamin with minerals  1 tablet Oral Daily  . oxybutynin  5 mg Oral QHS  . pantoprazole  40 mg Oral Daily  . polyethylene glycol  17 g Oral Daily  . predniSONE  50 mg Oral Q breakfast  .  sertraline  200 mg Oral QHS  . [START ON 11/23/2012] vancomycin  1,000 mg Intravenous On Call   Continuous Infusions:    Active Problems:   Encephalopathy, toxic secondary to pain medication   Intractable pain, left hip   Gait instability   Hyponatremia   Prostate cancer metastatic to multiple sites   DM (diabetes mellitus)   Dehydration   Leukocytosis, unspecified   Normocytic anemia   Dyspnea   Ascites    Time spent: 25 minutes    Josel Keo  [facility] Pager [phone]. If 8PM-8AM, please contact night-coverage at [email or web address], password [facility] 11/22/2012, 11:07 AM  LOS: 8 days

## 2012-11-23 ENCOUNTER — Inpatient Hospital Stay (HOSPITAL_COMMUNITY): Payer: Medicare Other

## 2012-11-23 ENCOUNTER — Other Ambulatory Visit: Payer: Self-pay

## 2012-11-23 LAB — GLUCOSE, CAPILLARY
Glucose-Capillary: 93 mg/dL (ref 70–99)
Glucose-Capillary: 131 mg/dL — ABNORMAL HIGH (ref 70–99)

## 2012-11-23 MED ORDER — MIDAZOLAM HCL 2 MG/2ML IJ SOLN
INTRAMUSCULAR | Status: AC | PRN
  Administered 2012-11-23 (×2): 0.5 mg via INTRAVENOUS
  Administered 2012-11-23: 1 mg via INTRAVENOUS

## 2012-11-23 MED ORDER — VANCOMYCIN HCL IN DEXTROSE 1-5 GM/200ML-% IV SOLN
1000.0000 mg | Freq: Once | INTRAVENOUS | Status: AC
  Administered 2012-11-23: 1000 mg via INTRAVENOUS
  Filled 2012-11-23: qty 200

## 2012-11-23 MED ORDER — CEPHALEXIN 500 MG PO CAPS: 500.0000 mg | ORAL_CAPSULE | Freq: Three times a day (TID) | ORAL | Status: DC

## 2012-11-23 MED ORDER — FENTANYL CITRATE 0.05 MG/ML IJ SOLN
INTRAMUSCULAR | Status: AC | PRN
  Administered 2012-11-23 (×3): 50 ug via INTRAVENOUS

## 2012-11-23 MED ORDER — PREDNISONE 20 MG PO TABS: 40.0000 mg | ORAL_TABLET | Freq: Every day | ORAL | Status: DC

## 2012-11-23 MED ORDER — TRAMADOL HCL 50 MG PO TABS: 25.0000 mg | ORAL_TABLET | Freq: Three times a day (TID) | ORAL | Status: DC | PRN

## 2012-11-23 MED ORDER — INSULIN GLARGINE 100 UNIT/ML ~~LOC~~ SOLN: 14.0000 [IU] | Freq: Every day | SUBCUTANEOUS | Status: DC

## 2012-11-23 MED ORDER — CEPHALEXIN 500 MG PO CAPS
500.0000 mg | ORAL_CAPSULE | Freq: Three times a day (TID) | ORAL | Status: DC
  Administered 2012-11-23: 500 mg via ORAL
  Filled 2012-11-23 (×3): qty 1

## 2012-11-23 NOTE — Progress Notes (Signed)
Visited with pt and his wife at bedside. Pt's wife shared their story at length...especially her frustration with Medicare and how long it took for her husband to be admitted and get help. Listened empathetically and provided ministry of presence.  Rutherford Nail

## 2012-11-23 NOTE — Procedures (Signed)
L3 kyphoplasty No complication No blood loss. See complete dictation in [facility].

## 2012-11-23 NOTE — Progress Notes (Signed)
PT Cancellation Note  Patient Details Name: Guy [name] MRN: [id number] DOB: [birth year]   Cancelled Treatment:    Reason Eval/Treat Not Completed: Medical issues which prohibited therapy pt with kyphoplasty today.  Please reorder PT when pt ready to resume ambulation attmepts   Donnetta Hail 11/23/2012, 11:50 AM

## 2012-11-23 NOTE — Discharge Summary (Addendum)
Physician Discharge Summary  Guy [name]:[id number] DOB: [birth year] DOA: 11/14/2012  PCP: Junious Silk, MD  Admit date: 11/14/2012 Discharge date: 11/23/2012  Time spent: 35 minutes  Recommendations for Outpatient Follow-up:  Needs to follow up with urologist. Need PT.  Discharge Diagnoses:    Encephalopathy, toxic secondary to pain medication   L3 Compression fracture.   Intractable pain, left hip   Gait instability   Hyponatremia   Prostate cancer metastatic to multiple sites   DM (diabetes mellitus)   Dehydration   Leukocytosis, unspecified   Normocytic anemia   Dyspnea   Ascites   Discharge Condition: Stable.  Diet recommendation: Heart Healthy.  Filed Weights   11/22/12 2139  Weight: 65.772 kg (145 lb)    History of present illness:  Guy [name] is an 79 y.o. male with a PMH of metastatic prostate cancer, on Lupron injections, last injection 10/29/12 who developed left sided hip pain after his injection. He was evaluated as an outpatient with an MRI of the thoracic and lumbar spine on 11/08/2012 (positive for a remote T4 vertebral body compression deformity, and posterior disc bulges and facet hypertrophy resulting in right greater than left foraminal narrowing at L3-L4, L4-L5, and L5-L6, advanced on the right at L4-L5 and L5-L6) and subsequently was diagnosed with severe sciatica. He was prescribed pain medications with no relief. The patient's wife brought him to the ER on 11/11/2012 and again on 11/13/2012 for evaluation as she cannot manage him at home. He is unable to ambulate secondary to left hip pain. He has fallen at home multiple times. His most recent PSA was elevated at 39.86. He was then placed on Casodex on 11/06/12. The patient was unable to ambulate without 2+ assist per nursing staff, and he was felt to be unsafe to return home. Additionally, he is having intractable hip pain and the pain medication has now made him confused and restless. Patient's  wife also reports that he occasionally has nightmares and hallucinations.   Hospital Course:  Encephalopathy, toxic secondary to pain medication : resolved.  -Patient with acute encephalopathy that is likely from pain medications although brain metastasis given his metastatic prostate cancer also in the differential.  -CT of the head negative for metastatic disease.  -limit narcotic.  Intractable pain, left hip/ back pain:  -This could be sciatica but given his history of metastatic prostate cancer, worrisome for bone involvement. He has had recent outpatient MRI scans of his lumbar and thoracic spine which did not show any evidence of metastatic deposits.  -Followup bone scan demonstrates activity in L 3. Would defer a PET scan for now, which can be done as an outpatient.  -We'll try to manage pain with nonsteroidal anti-inflammatories and low-dose narcotics.  - prednisone taper.  -X ray lumbar spine : Findings are worrisome for L3 compression deformity. MRI show new L 3 compression fracture. Discussed care with Dr Turner Daniels, recommend KP.  Patient s/p KP 2-24. Patient will be transfer to SNF this afternoon if stable. He will need to work with PT.  Hopefully discharge tomorrow after KP.  Gait instability  -Physical and occupational therapy evaluations requested and are pending.  SNF at time of discharge.  Hyponatremia  -Maybe secondary to dehydration. We'll gently hydrate. Resolved with IV fluids.  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  -bone scan result below. Alkaline phosphatase levels are elevated which would be consistent with metastatic bone disease.  -Continue  Casodex.  DM (diabetes mellitus)  -Continue Amaryl, , and moderate scale SSI.  - Hold lantus, patient NPO after midnight.  Leukocytosis, unspecified  -No obvious infectious etiology. UA done on 11/14/2012 negative for nitrites and leukocytes. CXR done  11/14/12 negative for PNA.  -Follow trend. Resolved.  Normocytic anemia  -Likely anemia of chronic disease. B12 level at 840.  Dyspnea  -Likely from an enlarging pulmonary metastasis. Supplemental oxygen as needed for oxygen saturation less than 92%.  Ascites  -no significant ascites. Unable to perform diagnostic paracentesis.  Urinary retention with Artificial Urinary Sphincter; urology recommend voiding attempts and in and out cath for urine retention. Appreciate urology help. -continue CIC q 6 hrs or prn, continue with CAB with casodex and Lupron. DVT prophylaxis: SCD.  UTI: UA with WBC 21 to 50. Received 2 days IV ceftriaxone. Patient will be discharge on Keflex for 7 days. Please fu urine culture result.    Procedures:  KP 2-24  Consultations:  IR  Dr Molli Hazard.   Discharge Exam: Filed Vitals:   11/23/12 1031 11/23/12 1037 11/23/12 1120 11/23/12 1139  BP: 184/74 162/75 156/59 162/53  Pulse: 61 61 55 54  Temp:   98.5 F (36.9 C) 98.4 F (36.9 C)  TempSrc:   Oral Oral  Resp: 15 18 18 18   Height:      Weight:      SpO2: 96% 97% 93% 95%    General: no distress.  Cardiovascular: S1, S 2 RRR Respiratory: CTA  Discharge Instructions  Discharge Orders   Future Orders Complete By Expires     Diet Carb Modified  As directed     Increase activity slowly  As directed         Medication List    STOP taking these medications       oxyCODONE-acetaminophen 10-325 MG per tablet  Commonly known as:  PERCOCET      TAKE these medications       acetaminophen 325 MG tablet  Commonly known as:  TYLENOL  Take 325 mg by mouth daily.     aspirin EC 81 MG tablet  Take 81 mg by mouth every evening.     bicalutamide 50 MG tablet  Commonly known as:  CASODEX  Take 50 mg by mouth daily.     CALCIUM 600+D HIGH POTENCY 600-400 MG-UNIT per tablet  Generic drug:  Calcium Carbonate-Vitamin D  Take 1 tablet by mouth 2 (two) times daily.     cephALEXin 500 MG capsule   Commonly known as:  KEFLEX  Take 1 capsule (500 mg total) by mouth every 8 (eight) hours.     docusate sodium 100 MG capsule  Commonly known as:  COLACE  Take 100 mg by mouth 2 (two) times daily.     docusate sodium 250 MG capsule  Commonly known as:  COLACE  Take 1 capsule (250 mg total) by mouth daily.     fluocinonide cream 0.05 %  Commonly known as:  LIDEX  Apply 1 application topically 2 (two) times daily.     gabapentin 100 MG capsule  Commonly known as:  NEURONTIN  Take 100 mg by mouth 3 (three) times daily.     glimepiride 4 MG tablet  Commonly known as:  AMARYL  Take 4 mg by mouth daily before breakfast.     insulin glargine 100 UNIT/ML injection  Commonly known as:  LANTUS  Inject 14 Units into the skin at bedtime.     loratadine 10 MG  tablet  Commonly known as:  CLARITIN  Take 10 mg by mouth daily as needed for allergies.     losartan 50 MG tablet  Commonly known as:  COZAAR  Take 50 mg by mouth every morning.     LUPRON IJ  Inject 1 each as directed as directed. [facility].     meclizine 25 MG tablet  Commonly known as:  ANTIVERT  Take 25 mg by mouth 3 (three) times daily as needed for dizziness.     metoprolol 50 MG tablet  Commonly known as:  LOPRESSOR  Take 25 mg by mouth 2 (two) times daily.     multivitamins ther. w/minerals Tabs  Take 1 tablet by mouth every morning.     nitroGLYCERIN 0.4 MG SL tablet  Commonly known as:  NITROSTAT  Place 0.4 mg under the tongue every 5 (five) minutes as needed for chest pain.     omeprazole 20 MG capsule  Commonly known as:  PRILOSEC  Take 20 mg by mouth 2 (two) times daily.     oxybutynin 5 MG tablet  Commonly known as:  DITROPAN  Take 5 mg by mouth at bedtime.     polyethylene glycol packet  Commonly known as:  MIRALAX / GLYCOLAX  Take 17 g by mouth daily.     predniSONE 20 MG tablet  Commonly known as:  DELTASONE  Take 2 tablets (40 mg total) by  mouth daily with breakfast.     RESTASIS 0.05 % ophthalmic emulsion  Generic drug:  cycloSPORINE  Place 1 drop into both eyes 2 (two) times daily.     sertraline 100 MG tablet  Commonly known as:  ZOLOFT  Take 200 mg by mouth at bedtime.     simvastatin 80 MG tablet  Commonly known as:  ZOCOR  Take 40 mg by mouth at bedtime.     traMADol 50 MG tablet  Commonly known as:  ULTRAM  Take 0.5 tablets (25 mg total) by mouth every 8 (eight) hours as needed for pain.          The results of significant diagnostics from this hospitalization (including imaging, microbiology, ancillary and laboratory) are listed below for reference.    Significant Diagnostic Studies: Dg Chest 1 View  11/14/2012  *RADIOLOGY REPORT*  Clinical Data: Weakness, shortness of breath.  CHEST - 1 VIEW  Comparison: 06/27/2011  Findings: Heart is upper limits normal in size.  Nodular density projects in the left upper lobe.  Cannot exclude pulmonary nodule. This has progressed since prior study.  Otherwise no confluent airspace opacities.  No effusions.  No acute bony abnormality.  Stable deformity of the proximal right humerus.  Degenerative changes in the right AC joint.  IMPRESSION: Enlarging nodular density in the left upper lobe.  Cannot exclude pulmonary nodule/neoplasm.  Recommend further evaluation with chest CT.  Mild COPD.   Original Report Authenticated By: Charlett Nose, M.D.    Dg Lumbar Spine Complete  11/18/2012  *RADIOLOGY REPORT*  Clinical Data: Back pain  LUMBAR SPINE - COMPLETE 4+ VIEW  Comparison: 04/18/2011  Findings: Numbering of the vertebral bodies is based on the previous MRI from 04/18/2011.  There is a new compression fracture of L3 primarily involving the inferior endplate.  There is also suspected to be an inferior end plate depression of L4 which is subtle.  L1, L2, and L5 are intact. Advanced degenerative changes in the facet joints of the mid and  lower lumbar spine are noted.  Severe osteopenia.   No pars defect.  IMPRESSION: Findings are worrisome for L3 compression deformity.  Correlation with outpatient MRI is recommended.   Original Report Authenticated By: Jolaine Click, M.D.    Dg Pelvis 1-2 Views  11/17/2012  *RADIOLOGY REPORT*  Clinical Data: Left hip pain.  10/04/2011  PELVIS - 1-2 VIEW  Comparison: CT 06/01/2008.  Dates for body bone scan.  Findings: Deformity noted in the left iliac crest at the anterior superior iliac spine.  This likely is related to old injury or enthesopathic changes and is unchanged since 2009.  Slight irregularity noted at the tip of the greater trochanter, likely enthesopathic changes.  No fracture.  Mild diffuse osteopenia. Since the surgical clips again noted in the pelvis.  IMPRESSION: No acute bony abnormality.  No fracture.  Chronic changes as above.   Original Report Authenticated By: Charlett Nose, M.D.    Dg Hip 1 View Left  11/17/2012  *RADIOLOGY REPORT*  Clinical Data: Left hip pain with no known injury  LEFT HIP - 1 VIEW:  Comparison: 06/14/2010  Findings: Overall assessment of the hip is compromised by the single lateral view.  The hip joint appears mildly narrowed and this is stable.  Deformity of the inferior aspect of the left iliac wing is again noted.  Bone density is stable in comparison with the prior exam and no definite acute abnormality is seen within the femoral head or shaft.  Evaluation of the intertrochanteric zone is compromised by position resulting in bony overlap of the greater trochanter and femoral neck.  IMPRESSION: Stable mild degenerative change of the hip joint. No obvious acute bony abnormality seen with compromised evaluation of the intertrochanteric zone.   Original Report Authenticated By: Rhodia Albright, M.D.    Ct Head W Wo Contrast  11/16/2012  *RADIOLOGY REPORT*  Clinical Data: Prostate carcinoma, leg pain  CT HEAD WITHOUT AND WITH CONTRAST  Technique:  Contiguous axial images were obtained from the base of the skull through  the vertex without and with intravenous contrast.  Contrast: 80mL OMNIPAQUE IOHEXOL 300 MG/ML  SOLN  Comparison: 06/14/2010  Findings: Atherosclerotic and physiologic intracranial calcifications.  Mucoperiosteal thickening in the right maxillary sinus.  Stable lacunar infarct in the left basal ganglia. Diffuse parenchymal atrophy. Patchy areas of hypoattenuation in deep and periventricular white matter bilaterally. Negative for acute intracranial hemorrhage, mass lesion, acute infarction, midline shift, or mass-effect.  No   unexpected enhancement after IV contrast administration.  Ventricles and sulci symmetric. Bone windows demonstrate no focal lesion.  IMPRESSION:  1. Negative for bleed, metastatic disease, or other acute intracranial process.  2. Atrophy and nonspecific white matter changes. 3.  Right maxillary sinus disease.   Original Report Authenticated By: D. Andria Rhein, MD    Ct Chest Wo Contrast  11/14/2012  *RADIOLOGY REPORT*  Clinical Data: Lung mass  CT CHEST WITHOUT CONTRAST  Technique:  Multidetector CT imaging of the chest was performed following the standard protocol without IV contrast.  Comparison: 10/08/2009  Findings: Lobulated and spiculated left upper lobe pulmonary nodule has markedly increased in size from 11 mm to 26 x 16 mm.  Irregular patchy and spiculated density in the right upper lobe posteriorly measures 1.8 x 1.0 cm on image 25.  Adjacent irregular smaller patchy densities are noted.  Focal ground-glass and irregular opacities in the right lower lobe on image 38 and compress in diameter of 2.6 x 1.6 cm.  9 mm nodule in the  right lower lobe on image 49 is stable.  Tiny pleural effusions with basilar atelectasis.  Images of the upper abdomen demonstrate no significant change in the appearance of the liver.  Gastrohepatic ligament nodes are partially imaged.  Three-vessel coronary artery calcifications.  Aortic valve calcifications.  Mitral valve calcifications.  Negative for  abnormal mediastinal adenopathy.  Calcified granulomata are noted in the right upper lobe.  Chronic appearing right-sided rib deformities.  Markedly irregular appearance of the proximal right humerus with bony expansion and lytic areas.  This is not significantly changed compared with prior chest radiographs. Stable thoracic spine.  IMPRESSION: Worsening left upper lobe nodule as described worrisome for malignancy.  PET CT is recommended.  There are multiple other findings worrisome for pulmonary malignancy.  These can also be assessed on the PET CT.  Stable right lower lobe pulmonary nodule.  Calcified granulomata are noted.   Original Report Authenticated By: Jolaine Click, M.D.    Mr Lumbar Spine W Wo Contrast  11/20/2012  *RADIOLOGY REPORT*  Clinical Data: Prostate cancer.  Compression fracture.  Back pain.  MRI LUMBAR SPINE WITHOUT AND WITH CONTRAST  Technique:  Multiplanar and multiecho pulse sequences of the lumbar spine were obtained without and with intravenous contrast.  Contrast: 15mL MULTIHANCE GADOBENATE DIMEGLUMINE 529 MG/ML IV SOLN  Comparison: Radiography 11/18/2012.  MRI 04/18/2011.  Findings: There is an acute or subacute compression fracture at L3 with loss of height of 40%.  There is edema and enhancement of the vertebral body.  The pattern is most consistent with a benign osteoporotic fracture.  There is no specific finding to suggest underlying metastatic disease.  There are no other regional marrow space abnormalities.  At the L3 level, there is no retropulsed bone.  There is curvature convex to the right with the apex at L3.  There is mild narrowing of the lateral recesses at L2-3 because of bulging of the disc.  At L3-4, the disc bulges moderately in a diffuse fashion.  There is facet and ligamentous hypertrophy. There is stenosis of both lateral recesses and neural foramina.  At L4-5, there is bilateral facet arthropathy with anterolisthesis of 2 mm.  There is ligamentous hypertrophy.   There is circumferential bulging of the disc.  There is stenosis of both lateral recesses and neural foramina.  L5-S1 is transitional and unremarkable.  IMPRESSION: Acute or subacute compression fracture at L3 with loss of height of 40%.  No retropulsed bone.  No finding to suggest that this is anything other than a benign osteoporotic fracture.  No other marrow space lesions.  Narrowing of both lateral recesses and neural foramina that could cause neural compression.  This is due to bulging of the disc and facet and ligamentous hypertrophy.  Bilateral lateral recess and foraminal stenosis at L4-5 because of facet arthropathy, 2 mm of anterolisthesis and bulging of the disc.  The degenerative changes are slightly progressive since the previous study but do not show a dramatic change.   Original Report Authenticated By: Paulina Fusi, M.D.    Nm Bone Scan Whole Body  11/17/2012  *RADIOLOGY REPORT*  Clinical Data: Intractable left hip pain.  NUCLEAR MEDICINE WHOLE BODY BONE SCINTIGRAPHY  Technique:  Whole body anterior and posterior images were obtained approximately 3 hours after intravenous injection of radiopharmaceutical.  Radiopharmaceutical: CURIE TC-MDP TECHNETIUM TC 109M MEDRONATE IV KIT  Comparison: On plain films 11/17/2012  Findings: There is rightward scoliosis in the lumbar spine. Increased activity noted in the lower lumbar spine, likely  the L3 level.  Cannot exclude compression fracture.  Recommend further evaluation with lumbar spine series.  Minimal/subtle increased activity at the tip of the left greater trochanter of unknown etiology or significance.  No other areas of abnormal bony uptake.  IMPRESSION: Increased activity within the mid to lower lumbar spine, likely L3. Cannot exclude compression fracture.  Recommend further evaluation with lumbar spine series.  Subtle focal increased activity at the tip of the left greater trochanter.  Given the location and appearance on the plain films,  this could be related to enthesopathic changes.   Original Report Authenticated By: Charlett Nose, M.D.    US Abdomen Limited  11/18/2012  *RADIOLOGY REPORT*  Clinical Data: Evaluate for ascites.  LIMITED ABDOMINAL ULTRASOUND  Comparison:  None.  Findings: Ultrasound is performed of the abdomen to evaluate for drainable ascites.  No significant ascites identified.  IMPRESSION: Exam is negative for ascites.   Original Report Authenticated By: Norva Pavlov, M.D.    Ir Fluoro Rm 30-60 Min  11/18/2012  *RADIOLOGY REPORT*  Clinical Data: Back pain and left hip pain  IR FLOURO RM 0-60 MIN  Comparison: None.  Findings: The patient was referred for L4-5 corticosteroid injection.  Fluoroscopic examination demonstrates a suspected L3 compression fracture.  The corticosteroid injection was not performed.  IMPRESSION: L3 vertebral compression fracture is suspected.  Plain radiographs of the lumbar spine will follow this examination.  Please note that this numbering scheme is based on the recent MRI from our facility dated 04/18/2011. I believe this is a different numbering scheme than on the recent MRI performed in [facility]   Original Report Authenticated By: Jolaine Click, M.D.    Mr Outside Films Spine  11/23/2012  This examination belongs to an outside facility and is stored  here for comparison purposes only.  Contact the originating outside  institution for any associated report or interpretation.    Microbiology: No results found for this or any previous visit (from the past 240 hour(s)).   Labs: Basic Metabolic Panel:  Recent Labs Lab 11/16/12 1535 11/17/12 0400 11/18/12 1155 11/19/12 0351 11/21/12 0401  NA  --  132* 135 136 135  K  --  3.8 3.6 3.7 3.7  CL  --  95* 100 103 103  CO2  --  28 27 27 26   GLUCOSE  --  109* 132* 64* 84  BUN  --  26* 29* 32* 25*  CREATININE 1.31 1.18 1.30 1.29 1.26  CALCIUM  --  8.9 8.5 8.4 7.8*   Liver Function Tests:  Recent Labs Lab 11/17/12 0400   AST 40*  ALT 45  ALKPHOS 208*  BILITOT 0.5  PROT 6.3  ALBUMIN 2.5*   No results found for this basename: LIPASE, AMYLASE,  in the last 168 hours  Recent Labs Lab 11/17/12 0408  AMMONIA 33   CBC:  Recent Labs Lab 11/16/12 1535 11/17/12 0400 11/18/12 1155 11/19/12 0351  WBC 14.2* 11.0* 6.8 9.0  HGB 11.7* 11.1* 9.5* 10.3*  HCT 34.4* 30.9* 28.1* 29.7*  MCV 95.8 95.1 95.9 95.5  PLT 206 166 125* 152   Cardiac Enzymes: No results found for this basename: CKTOTAL, CKMB, CKMBINDEX, TROPONINI,  in the last 168 hours BNP: BNP (last 3 results) No results found for this basename: PROBNP,  in the last 8760 hours CBG:  Recent Labs Lab 11/22/12 1113 11/22/12 1739 11/22/12 2105 11/23/12 0749 11/23/12 1145  GLUCAP 102* 185* 214* 93 131*       Signed:  Stephane Niemann  [facility] 11/23/2012, 12:03 PM

## 2012-11-23 NOTE — Progress Notes (Signed)
Pt for discharge to [facility].   CSW facilitated pt discharge needs including contacting facility, faxing pt discharge information via [facility], discussing with pt and pt wife at bedside, providing RN phone number to call report, and arranging ambulance transportation for pt to [facility].  No further social work needs identified at this time.   CSW signing off.   Jacklynn Lewis, MSW, LCSWA  Clinical Social Work [phone]

## 2012-11-24 LAB — URINE CULTURE

## 2012-12-17 ENCOUNTER — Non-Acute Institutional Stay (SKILLED_NURSING_FACILITY): Payer: Medicare Other | Admitting: Internal Medicine

## 2012-12-17 DIAGNOSIS — IMO0002 Reserved for concepts with insufficient information to code with codable children: Secondary | ICD-10-CM

## 2012-12-17 DIAGNOSIS — C61 Malignant neoplasm of prostate: Secondary | ICD-10-CM

## 2012-12-17 DIAGNOSIS — S32030S Wedge compression fracture of third lumbar vertebra, sequela: Secondary | ICD-10-CM

## 2012-12-17 DIAGNOSIS — I10 Essential (primary) hypertension: Secondary | ICD-10-CM

## 2012-12-17 DIAGNOSIS — E119 Type 2 diabetes mellitus without complications: Secondary | ICD-10-CM

## 2012-12-21 NOTE — Progress Notes (Signed)
Patient ID: Guy [name], male   DOB: March 06, 1920, 79 y.o.   MRN: [id number]         PROGRESS NOTE  DATE:  12/17/2012  FACILITY: [facility]   LEVEL OF CARE: SNF  Routine Visit  CHIEF COMPLAINT:  Manage hypertension, diabetes mellitus and metastatic prostate cancer.    HISTORY OF PRESENT ILLNESS:    REASSESSMENT OF ONGOING PROBLEMS:  Metastatic prostate cancer.  This is widely metastatic.  He is receiving Lupron injections.  He is tolerating them without any problems.    Hypertension.  The patient is tolerating his antihypertensives without any complications.  He denies headaches, dizziness or visual disturbances.  Last blood pressure is 170/64.    Diabetes mellitus.  He is tolerating his antihyperglycemic agents without any problems.  He denies polyuria or polydipsia.  A recent  hemoglobin A1C is not available.     PAST MEDICAL HISTORY : Reviewed.  No changes.  CURRENT MEDICATIONS: Reviewed per [facility]  REVIEW OF SYSTEMS:  GENERAL: no change in appetite, no fatigue, no weight changes, no fever, chills or weakness RESPIRATORY: no cough, SOB, DOE,, wheezing, hemoptysis CARDIAC: chronic lower extremity swelling.  no chest pain or palpitations GI: no abdominal pain, diarrhea, constipation, heart burn, nausea or vomiting  PHYSICAL EXAMINATION  VS:  T  98.2      P  66           RR  18         BP  170/64     POX %                WT (Lb)  152.6  GENERAL: no acute distress, normal body habitus EYES: conjunctivae normal, sclerae normal, normal eye lids NECK: supple, trachea midline, no neck masses, no thyroid tenderness, no thyromegaly LYMPHATICS: no LAN in the neck, no supraclavicular LAN RESPIRATORY: breathing is even & unlabored, BS CTAB CARDIAC: RRR, no murmur,no extra heart sound.  EDEMA/VARICOSITIES:  +2 bilateral lower extremity edema.  ARTERIAL:  pedal pulses nonpalpable.  GI: abdomen soft, normal BS, no masses, no tenderness, no hepatomegaly, no splenomegaly PSYCHIATRIC: the  patient is alert & oriented to person, affect & behavior appropriate  LABS/RADIOLOGY:  None in chart.   ASSESSMENT/PLAN:  Hypertension 401.1.  Last blood pressure elevated.  We will review a log.    Metastatic prostate cancer (            ).  Continue Lupron injections.    Diabetes mellitus 250.00.  Continue current medications.  Check  hemoglobin A1C.    L3 compression fracture (            ).  Status post kyphoplasty in February.    Constipation 564.00.  Continue MiraLAX.   Hyperlipidemia 272.0.  Continue Zocor.  Check fasting lipid panel.    GERD 530.81.  Well controlled.    V58.69.  Check CBC and CMP.     CPT CODE: 13244.

## 2012-12-28 ENCOUNTER — Non-Acute Institutional Stay (SKILLED_NURSING_FACILITY): Payer: Medicare Other | Admitting: Adult Health

## 2012-12-28 ENCOUNTER — Encounter: Payer: Self-pay | Admitting: Adult Health

## 2012-12-28 DIAGNOSIS — R609 Edema, unspecified: Secondary | ICD-10-CM | POA: Insufficient documentation

## 2012-12-28 DIAGNOSIS — C61 Malignant neoplasm of prostate: Secondary | ICD-10-CM | POA: Insufficient documentation

## 2012-12-28 DIAGNOSIS — I10 Essential (primary) hypertension: Secondary | ICD-10-CM | POA: Insufficient documentation

## 2012-12-28 MED ORDER — POTASSIUM CHLORIDE ER 10 MEQ PO TBCR: 10.0000 meq | EXTENDED_RELEASE_TABLET | Freq: Every day | ORAL | Status: DC

## 2012-12-28 MED ORDER — FUROSEMIDE 20 MG PO TABS: 20.0000 mg | ORAL_TABLET | Freq: Every day | ORAL | Status: DC

## 2012-12-28 NOTE — Progress Notes (Signed)
Subjective:     Patient ID: Guy [name], male   DOB: 02-Nov-1919, 79 y.o.   MRN: [id number]  Chief Complaint  Patient presents with  . Edema    HPI His wife has noted that he is getting worsening edema present it is bilateral and pitting. He denies any pain; no worsening shortness of breath and no chest pain. His wife is concerned about the level of edema he has present in his feet.  Past Medical History  Diagnosis Date  . Hypertension   . Diabetes mellitus without complication   . Arthritis   . Sciatica   . GERD (gastroesophageal reflux disease)   . Bladder cancer   . Urge incontinence   . Prostate cancer     S/P prostatectomy; Lung metastasis  . Vertigo   . Hyperlipidemia   . ED (erectile dysfunction)   . Gastric polyposis   . Osteoporosis   . Decreased libido   . Depression   . Hepatitis A   . CAD (coronary artery disease)   . Stroke   . History of blood transfusion     1946  . OA (osteoarthritis)    Past Surgical History  Procedure Laterality Date  . Coronary angioplasty with stent placement  1996  . Carpal tunnel release    . Cataract extraction w/ intraocular lens  implant, bilateral  1998  . Prostatectomy    . Urinary sphincter implant    . Urinary sphincter implant revision    . Appendectomy    . Left hip surgery      Donated bone for bone graft to arm  . Penile prosthesis implant      S/P removal and re-implantation of new prosthesis  . Middle ear surgery    . Finger surgery      Left and right  . Hernia repair    . Bladder surgery    . Right arm bone graft      Pathological fracture   Current Outpatient Prescriptions on File Prior to Visit  Medication Sig Dispense Refill  . acetaminophen (TYLENOL) 325 MG tablet Take 325 mg by mouth daily.       [name] aspirin EC 81 MG tablet Take 81 mg by mouth every evening.       . bicalutamide (CASODEX) 50 MG tablet Take 50 mg by mouth daily.      . Calcium Carbonate-Vitamin D (CALCIUM 600+D HIGH POTENCY) 600-400  MG-UNIT per tablet Take 1 tablet by mouth 2 (two) times daily.        . cycloSPORINE (RESTASIS) 0.05 % ophthalmic emulsion Place 1 drop into both eyes 2 (two) times daily.        [name] docusate sodium (COLACE) 100 MG capsule Take 100 mg by mouth 2 (two) times daily.       . fluocinonide (LIDEX) 0.05 % cream Apply 1 application topically 2 (two) times daily.        [name] gabapentin (NEURONTIN) 100 MG capsule Take 100 mg by mouth 3 (three) times daily.       [name] glimepiride (AMARYL) 4 MG tablet Take 4 mg by mouth daily before breakfast.        . insulin glargine (LANTUS) 100 UNIT/ML injection Inject 14 Units into the skin at bedtime.  10 mL  0  . Leuprolide Acetate (LUPRON IJ) Inject 1 each as directed as directed. He receives every 6 months at [facility].      . loratadine (CLARITIN) 10 MG tablet  Take 10 mg by mouth daily as needed for allergies.       [name] losartan (COZAAR) 50 MG tablet Take 50 mg by mouth every morning.        . meclizine (ANTIVERT) 25 MG tablet Take 25 mg by mouth 3 (three) times daily as needed for dizziness.       . metoprolol (LOPRESSOR) 50 MG tablet Take 25 mg by mouth 2 (two) times daily.        . Multiple Vitamins-Minerals (MULTIVITAMINS THER. W/MINERALS) TABS Take 1 tablet by mouth every morning.       . nitroGLYCERIN (NITROSTAT) 0.4 MG SL tablet Place 0.4 mg under the tongue every 5 (five) minutes as needed for chest pain.       [name] omeprazole (PRILOSEC) 20 MG capsule Take 20 mg by mouth 2 (two) times daily.        [name] oxybutynin (DITROPAN) 5 MG tablet Take 5 mg by mouth at bedtime.      . polyethylene glycol (MIRALAX / GLYCOLAX) packet Take 17 g by mouth daily.  14 each  0  . predniSONE (DELTASONE) 20 MG tablet Take 2 tablets (40 mg total) by mouth daily with breakfast.  3 tablet  0  . sertraline (ZOLOFT) 100 MG tablet Take 200 mg by mouth at bedtime.       . simvastatin (ZOCOR) 80 MG tablet Take 40 mg by mouth at bedtime.       . traMADol (ULTRAM) 50 MG tablet Take  0.5 tablets (25 mg total) by mouth every 8 (eight) hours as needed for pain.  30 tablet  0  . cephALEXin (KEFLEX) 500 MG capsule Take 1 capsule (500 mg total) by mouth every 8 (eight) hours.  21 capsule  0  . docusate sodium (COLACE) 250 MG capsule Take 1 capsule (250 mg total) by mouth daily.  10 capsule  0   No current facility-administered medications on file prior to visit.   Filed Vitals:   12/28/12 1439  BP: 141/66  Pulse: 58  Height: 5\' 10"  (1.778 m)  Weight: 155 lb (70.308 kg)     Review of Systems  Constitutional: Negative for appetite change.  Respiratory: Negative for cough, shortness of breath and wheezing.   Cardiovascular: Positive for leg swelling. Negative for chest pain.  Gastrointestinal: Negative for abdominal pain and constipation.  Musculoskeletal: Negative for back pain and joint swelling.  Skin: Negative.   Psychiatric/Behavioral: Negative.        Objective:   Physical Exam  Constitutional: He is oriented to person, place, and time.  thin  Neck: Neck supple. No JVD present.  Cardiovascular: Normal rate and intact distal pulses.   Did have several ectopic beats apically  Pulmonary/Chest: Effort normal and breath sounds normal.  Abdominal: Soft. Bowel sounds are normal.  Musculoskeletal: Normal range of motion.  Has 2+ pitting edema bilateral lower extremities in both ankles and feet  Neurological: He is alert and oriented to person, place, and time.  Skin: Skin is warm and dry.  Psychiatric: He has a normal mood and affect.       Assessment:    edema     Plan:    will begin lasix 20 mg daily with k+ 10 meq daily will begin him on daily weights; will get a chest x-ray and ekg; will get a cbc; bmp and bnp and will continue to monitor his status

## 2012-12-29 ENCOUNTER — Emergency Department (HOSPITAL_COMMUNITY): Payer: Medicare Other

## 2012-12-29 ENCOUNTER — Inpatient Hospital Stay (HOSPITAL_COMMUNITY): Payer: Medicare Other

## 2012-12-29 ENCOUNTER — Inpatient Hospital Stay (HOSPITAL_COMMUNITY)
Admission: EM | Admit: 2012-12-29 | Discharge: 2013-01-04 | DRG: 871 | Disposition: A | Discharge status: Skilled Nursing Facility | Payer: Medicare Other | Attending: Internal Medicine | Admitting: Internal Medicine

## 2012-12-29 DIAGNOSIS — J690 Pneumonitis due to inhalation of food and vomit: Secondary | ICD-10-CM

## 2012-12-29 DIAGNOSIS — IMO0002 Reserved for concepts with insufficient information to code with codable children: Secondary | ICD-10-CM

## 2012-12-29 DIAGNOSIS — R609 Edema, unspecified: Secondary | ICD-10-CM

## 2012-12-29 DIAGNOSIS — Z8551 Personal history of malignant neoplasm of bladder: Secondary | ICD-10-CM

## 2012-12-29 DIAGNOSIS — R52 Pain, unspecified: Secondary | ICD-10-CM

## 2012-12-29 DIAGNOSIS — D638 Anemia in other chronic diseases classified elsewhere: Secondary | ICD-10-CM | POA: Diagnosis present

## 2012-12-29 DIAGNOSIS — F329 Major depressive disorder, single episode, unspecified: Secondary | ICD-10-CM | POA: Diagnosis present

## 2012-12-29 DIAGNOSIS — R2681 Unsteadiness on feet: Secondary | ICD-10-CM

## 2012-12-29 DIAGNOSIS — I519 Heart disease, unspecified: Secondary | ICD-10-CM | POA: Diagnosis present

## 2012-12-29 DIAGNOSIS — R4182 Altered mental status, unspecified: Secondary | ICD-10-CM | POA: Diagnosis present

## 2012-12-29 DIAGNOSIS — M810 Age-related osteoporosis without current pathological fracture: Secondary | ICD-10-CM | POA: Diagnosis present

## 2012-12-29 DIAGNOSIS — E2749 Other adrenocortical insufficiency: Secondary | ICD-10-CM | POA: Diagnosis present

## 2012-12-29 DIAGNOSIS — R6520 Severe sepsis without septic shock: Secondary | ICD-10-CM

## 2012-12-29 DIAGNOSIS — E872 Acidosis, unspecified: Secondary | ICD-10-CM | POA: Diagnosis present

## 2012-12-29 DIAGNOSIS — C61 Malignant neoplasm of prostate: Secondary | ICD-10-CM

## 2012-12-29 DIAGNOSIS — E119 Type 2 diabetes mellitus without complications: Secondary | ICD-10-CM

## 2012-12-29 DIAGNOSIS — R0600 Dyspnea, unspecified: Secondary | ICD-10-CM

## 2012-12-29 DIAGNOSIS — N179 Acute kidney failure, unspecified: Secondary | ICD-10-CM

## 2012-12-29 DIAGNOSIS — Y92009 Unspecified place in unspecified non-institutional (private) residence as the place of occurrence of the external cause: Secondary | ICD-10-CM

## 2012-12-29 DIAGNOSIS — Z87891 Personal history of nicotine dependence: Secondary | ICD-10-CM

## 2012-12-29 DIAGNOSIS — Z9861 Coronary angioplasty status: Secondary | ICD-10-CM

## 2012-12-29 DIAGNOSIS — D689 Coagulation defect, unspecified: Secondary | ICD-10-CM

## 2012-12-29 DIAGNOSIS — G9341 Metabolic encephalopathy: Secondary | ICD-10-CM | POA: Diagnosis present

## 2012-12-29 DIAGNOSIS — D649 Anemia, unspecified: Secondary | ICD-10-CM

## 2012-12-29 DIAGNOSIS — G929 Unspecified toxic encephalopathy: Secondary | ICD-10-CM

## 2012-12-29 DIAGNOSIS — F3289 Other specified depressive episodes: Secondary | ICD-10-CM | POA: Diagnosis present

## 2012-12-29 DIAGNOSIS — M1990 Unspecified osteoarthritis, unspecified site: Secondary | ICD-10-CM | POA: Diagnosis present

## 2012-12-29 DIAGNOSIS — D72829 Elevated white blood cell count, unspecified: Secondary | ICD-10-CM

## 2012-12-29 DIAGNOSIS — G92 Toxic encephalopathy: Secondary | ICD-10-CM

## 2012-12-29 DIAGNOSIS — G9340 Encephalopathy, unspecified: Secondary | ICD-10-CM

## 2012-12-29 DIAGNOSIS — Z8673 Personal history of transient ischemic attack (TIA), and cerebral infarction without residual deficits: Secondary | ICD-10-CM

## 2012-12-29 DIAGNOSIS — R6521 Severe sepsis with septic shock: Secondary | ICD-10-CM | POA: Diagnosis present

## 2012-12-29 DIAGNOSIS — I2510 Atherosclerotic heart disease of native coronary artery without angina pectoris: Secondary | ICD-10-CM | POA: Diagnosis present

## 2012-12-29 DIAGNOSIS — T380X5A Adverse effect of glucocorticoids and synthetic analogues, initial encounter: Secondary | ICD-10-CM | POA: Diagnosis present

## 2012-12-29 DIAGNOSIS — C800 Disseminated malignant neoplasm, unspecified: Secondary | ICD-10-CM

## 2012-12-29 DIAGNOSIS — E876 Hypokalemia: Secondary | ICD-10-CM | POA: Diagnosis present

## 2012-12-29 DIAGNOSIS — I10 Essential (primary) hypertension: Secondary | ICD-10-CM

## 2012-12-29 DIAGNOSIS — A419 Sepsis, unspecified organism: Principal | ICD-10-CM

## 2012-12-29 DIAGNOSIS — E860 Dehydration: Secondary | ICD-10-CM

## 2012-12-29 DIAGNOSIS — D696 Thrombocytopenia, unspecified: Secondary | ICD-10-CM | POA: Diagnosis present

## 2012-12-29 DIAGNOSIS — C7800 Secondary malignant neoplasm of unspecified lung: Secondary | ICD-10-CM | POA: Diagnosis present

## 2012-12-29 DIAGNOSIS — Z66 Do not resuscitate: Secondary | ICD-10-CM | POA: Diagnosis present

## 2012-12-29 DIAGNOSIS — N390 Urinary tract infection, site not specified: Secondary | ICD-10-CM | POA: Diagnosis present

## 2012-12-29 DIAGNOSIS — K219 Gastro-esophageal reflux disease without esophagitis: Secondary | ICD-10-CM | POA: Diagnosis present

## 2012-12-29 DIAGNOSIS — E785 Hyperlipidemia, unspecified: Secondary | ICD-10-CM | POA: Diagnosis present

## 2012-12-29 DIAGNOSIS — Z7982 Long term (current) use of aspirin: Secondary | ICD-10-CM

## 2012-12-29 DIAGNOSIS — B961 Klebsiella pneumoniae [K. pneumoniae] as the cause of diseases classified elsewhere: Secondary | ICD-10-CM | POA: Diagnosis present

## 2012-12-29 DIAGNOSIS — E871 Hypo-osmolality and hyponatremia: Secondary | ICD-10-CM

## 2012-12-29 DIAGNOSIS — Z794 Long term (current) use of insulin: Secondary | ICD-10-CM

## 2012-12-29 DIAGNOSIS — J9600 Acute respiratory failure, unspecified whether with hypoxia or hypercapnia: Secondary | ICD-10-CM

## 2012-12-29 DIAGNOSIS — Z22322 Carrier or suspected carrier of Methicillin resistant Staphylococcus aureus: Secondary | ICD-10-CM

## 2012-12-29 DIAGNOSIS — R188 Other ascites: Secondary | ICD-10-CM

## 2012-12-29 DIAGNOSIS — Z8546 Personal history of malignant neoplasm of prostate: Secondary | ICD-10-CM

## 2012-12-29 LAB — POCT I-STAT 3, ART BLOOD GAS (G3+)
O2 Saturation: 100 %
pCO2 arterial: 35 mmHg (ref 35.0–45.0)
pH, Arterial: 7.373 (ref 7.350–7.450)
pO2, Arterial: 332 mmHg — ABNORMAL HIGH (ref 80.0–100.0)

## 2012-12-29 LAB — CBC
Hemoglobin: 9 g/dL — ABNORMAL LOW (ref 13.0–17.0)
Platelets: 77 10*3/uL — ABNORMAL LOW (ref 150–400)
RBC: 2.85 MIL/uL — ABNORMAL LOW (ref 4.22–5.81)

## 2012-12-29 LAB — URINE MICROSCOPIC-ADD ON

## 2012-12-29 LAB — CARBOXYHEMOGLOBIN: Total hemoglobin: 12.3 g/dL — ABNORMAL LOW (ref 13.5–18.0)

## 2012-12-29 LAB — CBC WITH DIFFERENTIAL/PLATELET
Basophils Absolute: 0 10*3/uL (ref 0.0–0.1)
Eosinophils Absolute: 0 10*3/uL (ref 0.0–0.7)
HCT: 24.5 % — ABNORMAL LOW (ref 39.0–52.0)
Lymphs Abs: 0.5 10*3/uL — ABNORMAL LOW (ref 0.7–4.0)
MCH: 32.2 pg (ref 26.0–34.0)
MCHC: 34.3 g/dL (ref 30.0–36.0)
MCV: 93.9 fL (ref 78.0–100.0)
Monocytes Absolute: 0.7 10*3/uL (ref 0.1–1.0)
Monocytes Relative: 6 % (ref 3–12)
Neutro Abs: 10.8 10*3/uL — ABNORMAL HIGH (ref 1.7–7.7)
Platelets: 71 10*3/uL — ABNORMAL LOW (ref 150–400)
RDW: 14.2 % (ref 11.5–15.5)
WBC: 12 10*3/uL — ABNORMAL HIGH (ref 4.0–10.5)

## 2012-12-29 LAB — DIC (DISSEMINATED INTRAVASCULAR COAGULATION)PANEL
D-Dimer, Quant: 8.67 ug/mL-FEU — ABNORMAL HIGH (ref 0.00–0.48)
Platelets: 102 10*3/uL — ABNORMAL LOW (ref 150–400)
Smear Review: NONE SEEN

## 2012-12-29 LAB — COMPREHENSIVE METABOLIC PANEL
ALT: 16 U/L (ref 0–53)
ALT: 19 U/L (ref 0–53)
AST: 38 U/L — ABNORMAL HIGH (ref 0–37)
AST: 44 U/L — ABNORMAL HIGH (ref 0–37)
Albumin: 1.7 g/dL — ABNORMAL LOW (ref 3.5–5.2)
Alkaline Phosphatase: 104 U/L (ref 39–117)
Alkaline Phosphatase: 116 U/L (ref 39–117)
BUN: 31 mg/dL — ABNORMAL HIGH (ref 6–23)
CO2: 21 mEq/L (ref 19–32)
Calcium: 7.6 mg/dL — ABNORMAL LOW (ref 8.4–10.5)
Chloride: 108 mEq/L (ref 96–112)
GFR calc Af Amer: 36 mL/min — ABNORMAL LOW (ref >90)
GFR calc non Af Amer: 31 mL/min — ABNORMAL LOW (ref >90)
Glucose, Bld: 131 mg/dL — ABNORMAL HIGH (ref 70–99)
Potassium: 3.1 mEq/L — ABNORMAL LOW (ref 3.5–5.1)
Potassium: 3.6 mEq/L (ref 3.5–5.1)
Sodium: 140 mEq/L (ref 135–145)
Sodium: 139 mEq/L (ref 135–145)
Total Bilirubin: 0.7 mg/dL (ref 0.3–1.2)
Total Protein: 4.8 g/dL — ABNORMAL LOW (ref 6.0–8.3)
Total Protein: 5.2 g/dL — ABNORMAL LOW (ref 6.0–8.3)

## 2012-12-29 LAB — TYPE AND SCREEN
DAT, IgG: NEGATIVE
PT AG Type: NEGATIVE

## 2012-12-29 LAB — PROTIME-INR
INR: 1.51 — ABNORMAL HIGH (ref 0.00–1.49)
Prothrombin Time: 17.8 seconds — ABNORMAL HIGH (ref 11.6–15.2)

## 2012-12-29 LAB — URINALYSIS, ROUTINE W REFLEX MICROSCOPIC
Glucose, UA: 250 mg/dL — AB
Glucose, UA: 100 mg/dL — AB
Ketones, ur: NEGATIVE mg/dL
Protein, ur: 30 mg/dL — AB
Specific Gravity, Urine: 1.018 (ref 1.005–1.030)
Urobilinogen, UA: 1 mg/dL (ref 0.0–1.0)
pH: 6 (ref 5.0–8.0)

## 2012-12-29 LAB — MRSA PCR SCREENING: MRSA by PCR: POSITIVE — AB

## 2012-12-29 LAB — GLUCOSE, CAPILLARY

## 2012-12-29 LAB — APTT: aPTT: 37 seconds (ref 24–37)

## 2012-12-29 LAB — PROCALCITONIN: Procalcitonin: 50.92 ng/mL

## 2012-12-29 MED ORDER — DEXTROSE 5 % IV SOLN
1.0000 g | INTRAVENOUS | Status: AC
  Filled 2012-12-29: qty 1

## 2012-12-29 MED ORDER — ASPIRIN 300 MG RE SUPP: 300.0000 mg | RECTAL | Status: DC

## 2012-12-29 MED ORDER — ASPIRIN 325 MG PO TABS
325.0000 mg | ORAL_TABLET | Freq: Every day | ORAL | Status: DC
  Administered 2012-12-29 – 2013-01-01 (×4): 325 mg via ORAL
  Filled 2012-12-29 (×5): qty 1

## 2012-12-29 MED ORDER — DEXTROSE 5 % IV SOLN: 2.0000 g | Freq: Once | INTRAVENOUS | Status: DC

## 2012-12-29 MED ORDER — FENTANYL CITRATE 0.05 MG/ML IJ SOLN
25.0000 ug | INTRAMUSCULAR | Status: DC | PRN
  Administered 2012-12-29 (×2): 50 ug via INTRAVENOUS

## 2012-12-29 MED ORDER — ETOMIDATE 2 MG/ML IV SOLN: 20.0000 mg | Freq: Once | INTRAVENOUS | Status: AC

## 2012-12-29 MED ORDER — SUCCINYLCHOLINE CHLORIDE 20 MG/ML IJ SOLN: 20.0000 mg | Freq: Once | INTRAMUSCULAR | Status: DC

## 2012-12-29 MED ORDER — NOREPINEPHRINE BITARTRATE 1 MG/ML IJ SOLN
5.0000 ug/min | INTRAVENOUS | Status: DC
  Administered 2012-12-29: 10 ug/min via INTRAVENOUS
  Filled 2012-12-29: qty 4

## 2012-12-29 MED ORDER — CHLORHEXIDINE GLUCONATE 0.12 % MT SOLN
15.0000 mL | Freq: Two times a day (BID) | OROMUCOSAL | Status: DC
  Administered 2012-12-29 – 2012-12-30 (×2): 15 mL via OROMUCOSAL
  Filled 2012-12-29 (×2): qty 15

## 2012-12-29 MED ORDER — FENTANYL CITRATE 0.05 MG/ML IJ SOLN
INTRAMUSCULAR | Status: AC
  Filled 2012-12-29: qty 2

## 2012-12-29 MED ORDER — SODIUM CHLORIDE 0.9 % IV SOLN: 250.0000 mL | INTRAVENOUS | Status: DC | PRN

## 2012-12-29 MED ORDER — MIDAZOLAM HCL 2 MG/2ML IJ SOLN
INTRAMUSCULAR | Status: AC
  Filled 2012-12-29: qty 2

## 2012-12-29 MED ORDER — DEXTROSE 5 % IV SOLN
1.0000 g | Freq: Three times a day (TID) | INTRAVENOUS | Status: DC
  Administered 2012-12-30 – 2013-01-01 (×8): 1 g via INTRAVENOUS
  Filled 2012-12-29 (×9): qty 1

## 2012-12-29 MED ORDER — HYDROCORTISONE SOD SUCCINATE 100 MG IJ SOLR
50.0000 mg | Freq: Four times a day (QID) | INTRAMUSCULAR | Status: DC
  Administered 2012-12-29 – 2012-12-31 (×8): 50 mg via INTRAVENOUS
  Filled 2012-12-29 (×10): qty 1
  Filled 2012-12-29: qty 2

## 2012-12-29 MED ORDER — LEVOFLOXACIN IN D5W 750 MG/150ML IV SOLN
750.0000 mg | Freq: Once | INTRAVENOUS | Status: AC
  Administered 2012-12-29: 750 mg via INTRAVENOUS
  Filled 2012-12-29 (×2): qty 150

## 2012-12-29 MED ORDER — ROCURONIUM BROMIDE 50 MG/5ML IV SOLN
INTRAVENOUS | Status: AC
  Filled 2012-12-29: qty 2

## 2012-12-29 MED ORDER — SODIUM CHLORIDE 0.9 % IV BOLUS (SEPSIS): 1000.0000 mL | INTRAVENOUS | Status: DC | PRN

## 2012-12-29 MED ORDER — CYCLOSPORINE 0.05 % OP EMUL
1.0000 [drp] | Freq: Two times a day (BID) | OPHTHALMIC | Status: DC
  Administered 2012-12-29 – 2013-01-04 (×12): 1 [drp] via OPHTHALMIC
  Filled 2012-12-29 (×15): qty 1

## 2012-12-29 MED ORDER — VANCOMYCIN HCL IN DEXTROSE 1-5 GM/200ML-% IV SOLN
1000.0000 mg | Freq: Once | INTRAVENOUS | Status: AC
  Administered 2012-12-29: 1000 mg via INTRAVENOUS
  Filled 2012-12-29: qty 200

## 2012-12-29 MED ORDER — SODIUM CHLORIDE 0.9 % IV SOLN
10.0000 ug/h | INTRAVENOUS | Status: DC
  Administered 2012-12-29: 25 ug/h via INTRAVENOUS
  Filled 2012-12-29 (×2): qty 50

## 2012-12-29 MED ORDER — HEPARIN SODIUM (PORCINE) 5000 UNIT/ML IJ SOLN: 5000.0000 [IU] | Freq: Three times a day (TID) | INTRAMUSCULAR | Status: DC

## 2012-12-29 MED ORDER — SUCCINYLCHOLINE CHLORIDE 20 MG/ML IJ SOLN
INTRAMUSCULAR | Status: AC
  Administered 2012-12-29: 20 mg via INTRAVENOUS
  Filled 2012-12-29: qty 1

## 2012-12-29 MED ORDER — INSULIN ASPART 100 UNIT/ML ~~LOC~~ SOLN
2.0000 [IU] | SUBCUTANEOUS | Status: DC
  Administered 2012-12-29 – 2012-12-30 (×2): 4 [IU] via SUBCUTANEOUS
  Administered 2012-12-30: 6 [IU] via SUBCUTANEOUS
  Administered 2012-12-30 – 2012-12-31 (×3): 4 [IU] via SUBCUTANEOUS
  Administered 2012-12-31 (×2): 2 [IU] via SUBCUTANEOUS

## 2012-12-29 MED ORDER — LIDOCAINE HCL (CARDIAC) 20 MG/ML IV SOLN
INTRAVENOUS | Status: AC
  Filled 2012-12-29: qty 5

## 2012-12-29 MED ORDER — ETOMIDATE 2 MG/ML IV SOLN
INTRAVENOUS | Status: AC
  Administered 2012-12-29: 20 mg via INTRAVENOUS
  Filled 2012-12-29: qty 20

## 2012-12-29 MED ORDER — ASPIRIN 81 MG PO CHEW: 324.0000 mg | CHEWABLE_TABLET | ORAL | Status: DC

## 2012-12-29 MED ORDER — MIDAZOLAM HCL 2 MG/2ML IJ SOLN
1.0000 mg | INTRAMUSCULAR | Status: DC | PRN
  Administered 2012-12-29: 0.5 mg via INTRAVENOUS

## 2012-12-29 MED ORDER — MIDAZOLAM HCL 2 MG/2ML IJ SOLN
INTRAMUSCULAR | Status: AC
  Administered 2012-12-29: 5 mg
  Filled 2012-12-29: qty 6

## 2012-12-29 MED ORDER — VANCOMYCIN HCL IN DEXTROSE 750-5 MG/150ML-% IV SOLN
750.0000 mg | INTRAVENOUS | Status: DC
  Filled 2012-12-29: qty 150

## 2012-12-29 MED ORDER — LEVOFLOXACIN IN D5W 750 MG/150ML IV SOLN
750.0000 mg | INTRAVENOUS | Status: DC
  Administered 2012-12-31 – 2013-01-04 (×3): 750 mg via INTRAVENOUS
  Filled 2012-12-29 (×3): qty 150

## 2012-12-29 MED ORDER — SODIUM CHLORIDE 0.9 % IV SOLN
INTRAVENOUS | Status: DC
  Administered 2012-12-29 – 2012-12-30 (×4): via INTRAVENOUS

## 2012-12-29 MED ORDER — PANTOPRAZOLE SODIUM 40 MG IV SOLR
40.0000 mg | Freq: Every day | INTRAVENOUS | Status: DC
  Administered 2012-12-29 – 2012-12-30 (×2): 40 mg via INTRAVENOUS
  Filled 2012-12-29 (×4): qty 40

## 2012-12-29 NOTE — Consult Note (Signed)
Referring Physician: Dr. Craige Cotta    Chief Complaint: altered mental status  HPI: Guy [name] is an 79 y.o. male admitted from SNF, undergoing rehab after kyphoplasty. Family noted all day that he had become more and more drowsy. His wife reportedly did try to wake him up, and he still had food in his mouth. Concern for aspiration as CXR shows RUL patchy infiltrate concerning for PNA.  EMS brought patient in and was ultimately intubated. CT Head negative. He is currently intubated, sedated and is on pressors for support. There is no family currently present.  Date last known well: 12/28/2012 Time last known well: Unable to determine  tPA Given: No: out of window.   Past Medical History  Diagnosis Date  . Hypertension   . Diabetes mellitus without complication   . Arthritis   . Sciatica   . GERD (gastroesophageal reflux disease)   . Bladder cancer   . Urge incontinence   . Prostate cancer     S/P prostatectomy; Lung metastasis  . Vertigo   . Hyperlipidemia   . ED (erectile dysfunction)   . Gastric polyposis   . Osteoporosis   . Decreased libido   . Depression   . Hepatitis A   . CAD (coronary artery disease)   . Stroke   . History of blood transfusion     1946  . OA (osteoarthritis)     Past Surgical History  Procedure Laterality Date  . Coronary angioplasty with stent placement  1996  . Carpal tunnel release    . Cataract extraction w/ intraocular lens  implant, bilateral  1998  . Prostatectomy    . Urinary sphincter implant    . Urinary sphincter implant revision    . Appendectomy    . Left hip surgery      Donated bone for bone graft to arm  . Penile prosthesis implant      S/P removal and re-implantation of new prosthesis  . Middle ear surgery    . Finger surgery      Left and right  . Hernia repair    . Bladder surgery    . Right arm bone graft      Pathological fracture    Family History  Problem Relation Age of Onset  . Heart failure Mother   .  Bladder Cancer Father   . Pancreatic cancer Sister   . Lung cancer Sister   . Breast cancer Daughter   . Liver disease Son    Social History:  reports that he has quit smoking. He has never used smokeless tobacco. He reports that he does not drink alcohol or use illicit drugs.  Allergies:  Allergies  Allergen Reactions  . Albuterol Sulfate Hfa (MVH:QIONGEXBM) Shortness Of Breath  . Adhesive (Tape) Other (See Comments)    REACTION: SKIN BLISTERS  . Penicillins Other (See Comments)    REACTION: ITCHING HANDS  . Sulfa Drugs Cross Reactors Hives   Current Facility-Administered Medications  Medication Dose Route Frequency Provider Last Rate Last Dose  . 0.9 %  sodium chloride infusion  250 mL Intravenous PRN Simonne Martinet, NP      . 0.9 %  sodium chloride infusion   Intravenous Continuous Simonne Martinet, NP      . aspirin tablet 325 mg  325 mg Oral Daily Oretha Milch, MD      . aztreonam (AZACTAM) 1 g in dextrose 5 % 50 mL IVPB  1 g Intravenous NOW Anh P  Arlester Marker, [facility]      . [START ON 12/30/2012] aztreonam (AZACTAM) 1 g in dextrose 5 % 50 mL IVPB  1 g Intravenous Q8H Anh P Pham, RPH      . cycloSPORINE (RESTASIS) 0.05 % ophthalmic emulsion 1 drop  1 drop Both Eyes BID Simonne Martinet, NP      . fentaNYL (SUBLIMAZE) 0.05 MG/ML injection           . fentaNYL (SUBLIMAZE) 10 mcg/mL in sodium chloride 0.9 % 250 mL infusion  10-150 mcg/hr Intravenous Continuous Oretha Milch, MD      . fentaNYL (SUBLIMAZE) injection 25-50 mcg  25-50 mcg Intravenous Q2H PRN Simonne Martinet, NP   50 mcg at 12/29/12 1744  . hydrocortisone sodium succinate (SOLU-CORTEF) 100 mg/2 mL injection 50 mg  50 mg Intravenous Q6H Simonne Martinet, NP   50 mg at 12/29/12 1753  . insulin aspart (novoLOG) injection 2-6 Units  2-6 Units Subcutaneous Q4H Simonne Martinet, NP      . levofloxacin (LEVAQUIN) IVPB 750 mg  750 mg Intravenous Once Simonne Martinet, NP      . Melene Muller ON 12/31/2012] levofloxacin (LEVAQUIN) IVPB 750 mg  750 mg  Intravenous Q48H Anh P Pham, RPH      . lidocaine (cardiac) 100 mg/33ml (XYLOCAINE) 20 MG/ML injection 2%           . midazolam (VERSED) 2 MG/2ML injection           . midazolam (VERSED) injection 1-2 mg  1-2 mg Intravenous Q2H PRN Simonne Martinet, NP   0.5 mg at 12/29/12 1612  . norepinephrine (LEVOPHED) 4 mg in dextrose 5 % 250 mL infusion  5-50 mcg/min Intravenous Titrated Simonne Martinet, NP 37.5 mL/hr at 12/29/12 1725 10 mcg/min at 12/29/12 1725  . pantoprazole (PROTONIX) injection 40 mg  40 mg Intravenous QHS Simonne Martinet, NP      . rocuronium [facility]) 50 MG/5ML injection           . sodium chloride 0.9 % bolus 1,000 mL  1,000 mL Intravenous PRN Simonne Martinet, NP      . sodium chloride 0.9 % bolus 1,000 mL  1,000 mL Intravenous PRN Simonne Martinet, NP      . Melene Muller ON 12/30/2012] vancomycin (VANCOCIN) IVPB 750 mg/150 ml premix  750 mg Intravenous Q24H Anh P Pham, RPH       Current Outpatient Prescriptions  Medication Sig Dispense Refill  . acetaminophen (TYLENOL) 325 MG tablet Take 325 mg by mouth daily.       . bicalutamide (CASODEX) 50 MG tablet Take 50 mg by mouth daily.      . bisacodyl (DULCOLAX) 10 MG suppository Place 10 mg rectally as needed for constipation (for constipation not relieved by MOM).      . Calcium Carbonate-Vitamin D (CALCIUM 600+D HIGH POTENCY) 600-400 MG-UNIT per tablet Take 1 tablet by mouth 2 (two) times daily.        . cycloSPORINE (RESTASIS) 0.05 % ophthalmic emulsion Place 1 drop into both eyes 2 (two) times daily.        [name] docusate sodium (COLACE) 100 MG capsule Take 100 mg by mouth 2 (two) times daily.       . furosemide (LASIX) 20 MG tablet Take 1 tablet (20 mg total) by mouth daily.  30 tablet  3  . gabapentin (NEURONTIN) 100 MG capsule Take 100 mg by mouth 3 (three) times daily.       [name]  glimepiride (AMARYL) 4 MG tablet Take 4 mg by mouth daily before breakfast.        . insulin aspart (NOVOLOG) 100 UNIT/ML injection Inject 8 Units into the skin once.       . insulin glargine (LANTUS) 100 UNIT/ML injection Inject 14 Units into the skin at bedtime.  10 mL  0  . loratadine (CLARITIN) 10 MG tablet Take 10 mg by mouth daily as needed for allergies.       [name] losartan (COZAAR) 50 MG tablet Take 50 mg by mouth every morning.        . magnesium hydroxide (MILK OF MAGNESIA) 400 MG/5ML suspension Take 30 mLs by mouth daily as needed for constipation.      . meclizine (ANTIVERT) 25 MG tablet Take 25 mg by mouth 3 (three) times daily as needed for dizziness.       . metoprolol (LOPRESSOR) 50 MG tablet Take 25 mg by mouth 2 (two) times daily.        . Multiple Vitamins-Minerals (MULTIVITAMINS THER. W/MINERALS) TABS Take 1 tablet by mouth every morning.       . naproxen sodium (ANAPROX) 220 MG tablet Take 440 mg by mouth 2 (two) times daily with a meal.      . nitroGLYCERIN (NITROSTAT) 0.4 MG SL tablet Place 0.4 mg under the tongue every 5 (five) minutes as needed for chest pain.       [name] omeprazole (PRILOSEC) 20 MG capsule Take 20 mg by mouth 2 (two) times daily.        [name] oxybutynin (DITROPAN) 5 MG tablet Take 5 mg by mouth at bedtime.      . polyethylene glycol (MIRALAX / GLYCOLAX) packet Take 17 g by mouth daily.  14 each  0  . potassium chloride (K-DUR) 10 MEQ tablet Take 1 tablet (10 mEq total) by mouth daily.  30 tablet  99  . predniSONE (DELTASONE) 20 MG tablet Take 2 tablets (40 mg total) by mouth daily with breakfast.  3 tablet  0  . sertraline (ZOLOFT) 100 MG tablet Take 200 mg by mouth at bedtime.       . simvastatin (ZOCOR) 40 MG tablet Take 40 mg by mouth at bedtime.      . sodium phosphate (FLEET) enema Place 1 enema rectally once as needed (for constipation not relieved by MOM or Bisacodyl Supp.). follow package directions      . traMADol (ULTRAM) 50 MG tablet Take 25 mg by mouth every 8 (eight) hours.         ROS: History obtained from unobtainable from patient due to mental status  Per medical record/EMS General ROS: negative for -  chills, fatigue, fever, night sweats, weight gain or weight loss Psychological ROS: negative for - behavioral disorder, hallucinations, memory difficulties, mood swings or suicidal ideation, altered mental status today Ophthalmic ROS: negative for - blurry vision, double vision, eye pain or loss of vision ENT ROS: negative for - epistaxis, nasal discharge, oral lesions, sore throat, tinnitus or vertigo Allergy and Immunology ROS: negative for - hives or itchy/watery eyes Hematological and Lymphatic ROS: negative for - bleeding problems, bruising or swollen lymph nodes Endocrine ROS: negative for - galactorrhea, hair pattern changes, polydipsia/polyuria or temperature intolerance Respiratory ROS: negative for - cough, hemoptysis, shortness of breath or wheezing Cardiovascular ROS: negative for - chest pain, dyspnea on exertion, edema or irregular heartbeat Gastrointestinal ROS: negative for - abdominal pain, diarrhea, hematemesis, nausea/vomiting or stool incontinence Genito-Urinary ROS: negative for -  dysuria, hematuria, incontinence or urinary frequency/urgency Musculoskeletal ROS: negative for - joint swelling or muscular weakness Neurological ROS: as noted in HPI Dermatological ROS: negative for rash and skin lesion changes   Physical Examination: Blood pressure 91/46, pulse 57, temperature 98.1 F (36.7 C), resp. rate 14, height 5\' 11"  (1.803 m), SpO2 100.00%.  Neurologic Examination: Mental Status: Intubated, sedated. Cranial Nerves: II:  pupils equal, round, reactive to light and accommodation, corneal intact IX,X: gag reflex present XII: tongue strength normal  Motor: extremities non purposeful movements. No response to painful stimuli Sensory: unable to assess Plantars: equivicol Cerebellar: unable to test   Results for orders placed during the hospital encounter of 12/29/12 (from the past 48 hour(s))  COMPREHENSIVE METABOLIC PANEL     Status: Abnormal   Collection Time     12/29/12  2:31 PM      Result Value Range   Sodium 140  135 - 145 mEq/L   Potassium 3.1 (*) 3.5 - 5.1 mEq/L   Chloride 108  96 - 112 mEq/L   CO2 21  19 - 32 mEq/L   Glucose, Bld 94  70 - 99 mg/dL   BUN 31 (*) 6 - 23 mg/dL   Creatinine, Ser 1.61 (*) 0.50 - 1.35 mg/dL   Calcium 7.5 (*) 8.4 - 10.5 mg/dL   Total Protein 4.8 (*) 6.0 - 8.3 g/dL   Albumin 1.7 (*) 3.5 - 5.2 g/dL   AST 38 (*) 0 - 37 U/L   ALT 16  0 - 53 U/L   Alkaline Phosphatase 104  39 - 117 U/L   Total Bilirubin 0.7  0.3 - 1.2 mg/dL   GFR calc non Af Amer 32 (*) >90 mL/min   GFR calc Af Amer 37 (*) >90 mL/min   Comment:            The eGFR has been calculated     using the CKD EPI equation.     This calculation has not been     validated in all clinical     situations.     eGFR's persistently     <90 mL/min signify     possible Chronic Kidney Disease.  CBC WITH DIFFERENTIAL     Status: Abnormal   Collection Time    12/29/12  2:31 PM      Result Value Range   WBC 12.0 (*) 4.0 - 10.5 K/uL   RBC 2.61 (*) 4.22 - 5.81 MIL/uL   Hemoglobin 8.4 (*) 13.0 - 17.0 g/dL   HCT 09.6 (*) 04.5 - 40.9 %   MCV 93.9  78.0 - 100.0 fL   MCH 32.2  26.0 - 34.0 pg   MCHC 34.3  30.0 - 36.0 g/dL   RDW 81.1  91.4 - 78.2 %   Platelets 71 (*) 150 - 400 K/uL   Comment: REPEATED TO VERIFY     SPECIMEN CHECKED FOR CLOTS     PLATELETS APPEAR DECREASED     PLATELET COUNT CONFIRMED BY SMEAR   Neutrophils Relative 90 (*) 43 - 77 %   Lymphocytes Relative 4 (*) 12 - 46 %   Monocytes Relative 6  3 - 12 %   Eosinophils Relative 0  0 - 5 %   Basophils Relative 0  0 - 1 %   Neutro Abs 10.8 (*) 1.7 - 7.7 K/uL   Lymphs Abs 0.5 (*) 0.7 - 4.0 K/uL   Monocytes Absolute 0.7  0.1 - 1.0 K/uL   Eosinophils Absolute 0.0  0.0 - 0.7 K/uL   Basophils Absolute 0.0  0.0 - 0.1 K/uL   WBC Morphology TOXIC GRANULATION     Comment: INCREASED BANDS (>20% BANDS)     MILD LEFT SHIFT (1-5% METAS, OCC MYELO, OCC BANDS)     VACUOLATED NEUTROPHILS  PROTIME-INR      Status: Abnormal   Collection Time    12/29/12  2:31 PM      Result Value Range   Prothrombin Time 19.1 (*) 11.6 - 15.2 seconds   INR 1.66 (*) 0.00 - 1.49  CG4 I-STAT (LACTIC ACID)     Status: Abnormal   Collection Time    12/29/12  2:55 PM      Result Value Range   Lactic Acid, Venous 3.22 (*) 0.5 - 2.2 mmol/L  URINALYSIS, ROUTINE W REFLEX MICROSCOPIC     Status: Abnormal   Collection Time    12/29/12  3:00 PM      Result Value Range   Color, Urine YELLOW  YELLOW   APPearance CLOUDY (*) CLEAR   Specific Gravity, Urine 1.010  1.005 - 1.030   pH 6.0  5.0 - 8.0   Glucose, UA 250 (*) NEGATIVE mg/dL   Hgb urine dipstick MODERATE (*) NEGATIVE   Bilirubin Urine NEGATIVE  NEGATIVE   Ketones, ur NEGATIVE  NEGATIVE mg/dL   Protein, ur 30 (*) NEGATIVE mg/dL   Urobilinogen, UA 0.2  0.0 - 1.0 mg/dL   Nitrite NEGATIVE  NEGATIVE   Leukocytes, UA LARGE (*) NEGATIVE  URINE MICROSCOPIC-ADD ON     Status: Abnormal   Collection Time    12/29/12  3:00 PM      Result Value Range   Squamous Epithelial / LPF RARE  RARE   WBC, UA TOO NUMEROUS TO COUNT  <3 WBC/hpf   Bacteria, UA MANY (*) RARE   Casts HYALINE CASTS (*) NEGATIVE  CBC     Status: Abnormal   Collection Time    12/29/12  5:00 PM      Result Value Range   WBC 13.3 (*) 4.0 - 10.5 K/uL   RBC 2.85 (*) 4.22 - 5.81 MIL/uL   Hemoglobin 9.0 (*) 13.0 - 17.0 g/dL   HCT 47.8 (*) 29.5 - 62.1 %   MCV 95.4  78.0 - 100.0 fL   MCH 31.6  26.0 - 34.0 pg   MCHC 33.1  30.0 - 36.0 g/dL   RDW 30.8  65.7 - 84.6 %   Platelets 77 (*) 150 - 400 K/uL   Comment: CONSISTENT WITH PREVIOUS RESULT  COMPREHENSIVE METABOLIC PANEL     Status: Abnormal   Collection Time    12/29/12  5:00 PM      Result Value Range   Sodium 139  135 - 145 mEq/L   Potassium 3.6  3.5 - 5.1 mEq/L   Chloride 108  96 - 112 mEq/L   CO2 21  19 - 32 mEq/L   Glucose, Bld 131 (*) 70 - 99 mg/dL   BUN 31 (*) 6 - 23 mg/dL   Creatinine, Ser 9.62 (*) 0.50 - 1.35 mg/dL   Calcium 7.6 (*)  8.4 - 10.5 mg/dL   Total Protein 5.2 (*) 6.0 - 8.3 g/dL   Albumin 1.8 (*) 3.5 - 5.2 g/dL   AST 44 (*) 0 - 37 U/L   ALT 19  0 - 53 U/L   Alkaline Phosphatase 116  39 - 117 U/L   Total Bilirubin 0.6  0.3 - 1.2 mg/dL  GFR calc non Af Amer 31 (*) >90 mL/min   GFR calc Af Amer 36 (*) >90 mL/min   Comment:            The eGFR has been calculated     using the CKD EPI equation.     This calculation has not been     validated in all clinical     situations.     eGFR's persistently     <90 mL/min signify     possible Chronic Kidney Disease.  LACTIC ACID, PLASMA     Status: Abnormal   Collection Time    12/29/12  5:00 PM      Result Value Range   Lactic Acid, Venous 3.0 (*) 0.5 - 2.2 mmol/L  PROCALCITONIN     Status: None   Collection Time    12/29/12  5:00 PM      Result Value Range   Procalcitonin 50.92     Comment:            Interpretation:     PCT >= 10 ng/mL:     Important systemic inflammatory response,     almost exclusively due to severe bacterial     sepsis or septic shock.     (NOTE)             ICU PCT Algorithm               Non ICU PCT Algorithm        ----------------------------     ------------------------------             PCT < 0.25 ng/mL                 PCT < 0.1 ng/mL         Stopping of antibiotics            Stopping of antibiotics           strongly encouraged.               strongly encouraged.        ----------------------------     ------------------------------           PCT level decrease by               PCT < 0.25 ng/mL           >= 80% from peak PCT           OR PCT 0.25 - 0.5 ng/mL          Stopping of antibiotics                                                 encouraged.         Stopping of antibiotics               encouraged.        ----------------------------     ------------------------------           PCT level decrease by              PCT >= 0.25 ng/mL           < 80% from peak PCT            AND PCT >= 0.5 ng/mL  Continuing  antibiotics                                                  encouraged.           Continuing antibiotics                encouraged.        ----------------------------     ------------------------------         PCT level increase compared          PCT > 0.5 ng/mL             with peak PCT AND              PCT >= 0.5 ng/mL             Escalation of antibiotics                                              strongly encouraged.          Escalation of antibiotics            strongly encouraged.  TROPONIN I     Status: Abnormal   Collection Time    12/29/12  5:00 PM      Result Value Range   Troponin I 2.43 (*) <0.30 ng/mL   Comment:            Due to the release kinetics of cTnI,     a negative result within the first hours     of the onset of symptoms does not rule out     myocardial infarction with certainty.     If myocardial infarction is still suspected,     repeat the test at appropriate intervals.     CRITICAL RESULT CALLED TO, READ BACK BY AND VERIFIED WITH:     Rexene Edison [id number] EBANKS COLCLOUGH, S  PROTIME-INR     Status: Abnormal   Collection Time    12/29/12  5:00 PM      Result Value Range   Prothrombin Time 17.8 (*) 11.6 - 15.2 seconds   INR 1.51 (*) 0.00 - 1.49  APTT     Status: None   Collection Time    12/29/12  5:00 PM      Result Value Range   aPTT 37  24 - 37 seconds   Comment:            IF BASELINE aPTT IS ELEVATED,     SUGGEST PATIENT RISK ASSESSMENT     BE USED TO DETERMINE APPROPRIATE     ANTICOAGULANT THERAPY.  FIBRINOGEN     Status: None   Collection Time    12/29/12  5:00 PM      Result Value Range   Fibrinogen 349  204 - 475 mg/dL  TYPE AND SCREEN     Status: None   Collection Time    12/29/12  5:00 PM      Result Value Range   ABO/RH(D) O POS     Antibody Screen PENDING     Sample Expiration 01/01/2013     Ct Head Wo Contrast  12/29/2012  *RADIOLOGY REPORT*  Clinical Data: Altered mental status  CT HEAD WITHOUT CONTRAST  Technique:   Contiguous axial images were obtained from the base of the skull through the vertex without contrast.  Comparison: CT 11/16/2012  Findings: Moderate atrophy.  Chronic microvascular ischemic changes in the white matter are stable from prior study.  No acute infarct. Negative for hemorrhage or mass.  No midline shift.  Calvarium is intact.  There is chronic sinusitis, most prominent in the right maxillary sinus.  No acute bony abnormality.  IMPRESSION: Atrophy and chronic microvascular ischemia.  No acute intracranial abnormality.  Chronic sinusitis.   Original Report Authenticated By: Janeece Riggers, M.D.    Dg Chest Portable 1 View  12/29/2012  *RADIOLOGY REPORT*  Clinical Data: Central line placement.  PORTABLE CHEST - 1 VIEW  Comparison: 12/29/2012 1:17 p.m.  Findings: Endotracheal tube tip 2.3 cm above the carina.  Left central line has been placed and the tip is at the level of the distal superior vena cava.  No gross pneumothorax.  Mediastinum appears prominent.  There is may be related to the right upper lobe atelectasis/consolidation.  Primary mediastinal abnormality not excluded.  Pulmonary vascular congestion most notable centrally.  Cardiomegaly.  IMPRESSION: Left central line has been placed and the tip is at the level of the distal superior vena cava.  No gross pneumothorax.  Mediastinum appears prominent.  There is may be related to the right upper lobe atelectasis/consolidation.  Primary mediastinal abnormality not excluded.  Pulmonary vascular congestion most notable centrally.  Cardiomegaly.  This is a call report.   Original Report Authenticated By: Lacy Duverney, M.D.    Dg Chest Portable 1 View  12/29/2012  *RADIOLOGY REPORT*  Clinical Data: Altered mental status, ETT adjustment  PORTABLE CHEST - 1 VIEW  Comparison: None.  Findings: Endotracheal tube terminates 3 cm above the carina.  Patchy right upper lobe opacity, suspicious for pneumonia.  Patchy bibasilar opacities, likely atelectasis.   Patchy/nodular opacity in the lateral left mid lung, suspicious for neoplasm on prior CT. No pleural effusion or pneumothorax.  Mild cardiomegaly.  IMPRESSION: Endotracheal tube terminates 3 cm above the carina.  Otherwise unchanged.   Original Report Authenticated By: Charline Bills, M.D.    Dg Chest Portable 1 View  12/29/2012  *RADIOLOGY REPORT*  Clinical Data: Altered mental status, ETT placement  PORTABLE CHEST - 1 VIEW  Comparison: CT chest dated 11/14/2012  Findings: Endotracheal tube terminates 1 cm above the carina.  Mild patchy right upper lobe opacity, suspicious for pneumonia. Patchy bibasilar opacities, possibly atelectasis.  Nodular opacity in the left mid lung, corresponding to possible malignancy on prior CT.  No pleural effusion or pneumothorax.  Mild cardiomegaly.  IMPRESSION: Endotracheal tube terminates 1 cm above the carina.  Consider withdrawal approximately 2 cm.  Mild patchy right upper lobe opacity, suspicious for pneumonia.  Nodular opacity in the left mid lung, corresponding to possible malignancy on prior CT.   Original Report Authenticated By: Charline Bills, M.D.     Assessment: 78 y.o. male with altered mental status, progressive with concerns for aspiration leading to ultimate intubation. CT shows no definite stroke. Patients presentation may be multifactorial including infection; however, would proceed with MRI once patient becomes stable.  Stroke Risk Factors - diabetes mellitus, hyperlipidemia and hypertension  Plan: 1. HgbA1c, fasting lipid panel 2. MRI, MRA  of the brain without contrast-once stable. 3. PT consult, OT consult, Speech consult-upon extubation. Keep NPO until formal speech eval 4. Echocardiogram 5. Carotid dopplers 6. Prophylactic therapy-Antiplatelet med: Aspirin - dose 325mg  (started  already due to positive troponin) 7. Risk factor modification 8. Telemetry monitoring 9. Frequent neuro checks   Job Founds, MBA, MHA [facility] Pager [phone] 12/29/2012, 6:33 PM Patient seen and examined together with physician assistant and I concur with the assessment and plan.  Wyatt Portela, MD

## 2012-12-29 NOTE — Progress Notes (Signed)
Pt intubated with a 7.5 ETT secured at 24 cm at the lip. Post CXR MD ordered to pull back the ETT 1cm. The ETT is now secured at 23cm @ the lip. A second CXR confirmed appropriate ETT placement. RT will monitor

## 2012-12-29 NOTE — ED Notes (Signed)
Pt returned from CT and on monitor. Wife at bedside.

## 2012-12-29 NOTE — Progress Notes (Signed)
CSW met with wife for emotional and comfort support. CSW had worked with Pt prior to this admission and informed MD and nursing staff of Pt Hx.   CSW contacted [facility] to inform them of his daughter's need to be transported to ED. Facility CSW was also notified through VM of need for daughter to be transported to ED/Hospital.   Evening CSW will provide support as needed.    Leron Croak, LCSWA [facility] Emergency Dept.  [phone]

## 2012-12-29 NOTE — ED Notes (Signed)
May use CVL per CCM MD

## 2012-12-29 NOTE — ED Notes (Signed)
Lactic acid results given to Dr. Beaton 

## 2012-12-29 NOTE — ED Notes (Signed)
Dr. Sood at the bedside.

## 2012-12-29 NOTE — ED Notes (Addendum)
Pt intubated with 7.5 ETT secured at 23

## 2012-12-29 NOTE — ED Notes (Addendum)
Pharmacy to send Levaquin. Not in Pyxis

## 2012-12-29 NOTE — ED Notes (Signed)
Pt BIB EMS from [facility] for AMS/LOC. Pt in rehab there for back fx. Pt fell x2 weeks ago, and presents to ED with bruised area to left forehead. Per report, pt usual a/o x4. Pt went to MD appt this AM, for f/u on back and had steroid injection. Pt became unresponsive after back from MD appt. Pt cheynne-stokes breathing on ED arrival

## 2012-12-29 NOTE — ED Notes (Signed)
Pt moved into Trauma C. RSI kit at bedside. Dr.Beaton to intubate per wife request. Wife taken to consult room and chaplain called. Respiratory at bedside.

## 2012-12-29 NOTE — ED Provider Notes (Signed)
History     CSN: [id number]  Arrival date & time 12/29/12  1240   First MD Initiated Contact with Patient 12/29/12 1247      Chief Complaint  Patient presents with  . Altered Mental Status  . Loss of Consciousness    (Consider location/radiation/quality/duration/timing/severity/associated sxs/prior treatment) HPI Patient present to the ED via EMS with altered mental status.  EMTs report that he was initially responsive to verbal stimuli, responding in mumbles.  During the trip from the nursing home to the ED, he rapidly declined and on arrival he was not responsive to sternal rub and breathing in Cheney-Stokes pattern.  History of fall 2 weeks with with head trauma that was not evaluated.  Also with metastatic prostrate cancer.  Wife reports that he was normal yesterday and earlier this morning.  Past Medical History  Diagnosis Date  . Hypertension   . Diabetes mellitus without complication   . Arthritis   . Sciatica   . GERD (gastroesophageal reflux disease)   . Bladder cancer   . Urge incontinence   . Prostate cancer     S/P prostatectomy; Lung metastasis  . Vertigo   . Hyperlipidemia   . ED (erectile dysfunction)   . Gastric polyposis   . Osteoporosis   . Decreased libido   . Depression   . Hepatitis A   . CAD (coronary artery disease)   . Stroke   . History of blood transfusion     1946  . OA (osteoarthritis)     Past Surgical History  Procedure Laterality Date  . Coronary angioplasty with stent placement  1996  . Carpal tunnel release    . Cataract extraction w/ intraocular lens  implant, bilateral  1998  . Prostatectomy    . Urinary sphincter implant    . Urinary sphincter implant revision    . Appendectomy    . Left hip surgery      Donated bone for bone graft to arm  . Penile prosthesis implant      S/P removal and re-implantation of new prosthesis  . Middle ear surgery    . Finger surgery      Left and right  . Hernia repair    . Bladder surgery     . Right arm bone graft      Pathological fracture    Family History  Problem Relation Age of Onset  . Heart failure Mother   . Bladder Cancer Father   . Pancreatic cancer Sister   . Lung cancer Sister   . Breast cancer Daughter   . Liver disease Son     History  Substance Use Topics  . Smoking status: Former Games developer  . Smokeless tobacco: Never Used  . Alcohol Use: No      Review of Systems  Unable to perform ROS   Allergies  Albuterol sulfate hfa; Adhesive; Penicillins; and Sulfa drugs cross reactors  Home Medications   Current Outpatient Rx  Name  Route  Sig  Dispense  Refill  . acetaminophen (TYLENOL) 325 MG tablet   Oral   Take 325 mg by mouth daily.          . bicalutamide (CASODEX) 50 MG tablet   Oral   Take 50 mg by mouth daily.         . bisacodyl (DULCOLAX) 10 MG suppository   Rectal   Place 10 mg rectally as needed for constipation (for constipation not relieved by MOM).         [name]  Calcium Carbonate-Vitamin D (CALCIUM 600+D HIGH POTENCY) 600-400 MG-UNIT per tablet   Oral   Take 1 tablet by mouth 2 (two) times daily.           . cycloSPORINE (RESTASIS) 0.05 % ophthalmic emulsion   Both Eyes   Place 1 drop into both eyes 2 (two) times daily.           [name] docusate sodium (COLACE) 100 MG capsule   Oral   Take 100 mg by mouth 2 (two) times daily.          . furosemide (LASIX) 20 MG tablet   Oral   Take 1 tablet (20 mg total) by mouth daily.   30 tablet   3   . gabapentin (NEURONTIN) 100 MG capsule   Oral   Take 100 mg by mouth 3 (three) times daily.          [name] glimepiride (AMARYL) 4 MG tablet   Oral   Take 4 mg by mouth daily before breakfast.           . insulin aspart (NOVOLOG) 100 UNIT/ML injection   Subcutaneous   Inject 8 Units into the skin once.         . insulin glargine (LANTUS) 100 UNIT/ML injection   Subcutaneous   Inject 14 Units into the skin at bedtime.   10 mL   0   . loratadine (CLARITIN) 10 MG  tablet   Oral   Take 10 mg by mouth daily as needed for allergies.          [name] losartan (COZAAR) 50 MG tablet   Oral   Take 50 mg by mouth every morning.           . magnesium hydroxide (MILK OF MAGNESIA) 400 MG/5ML suspension   Oral   Take 30 mLs by mouth daily as needed for constipation.         . meclizine (ANTIVERT) 25 MG tablet   Oral   Take 25 mg by mouth 3 (three) times daily as needed for dizziness.          . metoprolol (LOPRESSOR) 50 MG tablet   Oral   Take 25 mg by mouth 2 (two) times daily.           . Multiple Vitamins-Minerals (MULTIVITAMINS THER. W/MINERALS) TABS   Oral   Take 1 tablet by mouth every morning.          . naproxen sodium (ANAPROX) 220 MG tablet   Oral   Take 440 mg by mouth 2 (two) times daily with a meal.         . nitroGLYCERIN (NITROSTAT) 0.4 MG SL tablet   Sublingual   Place 0.4 mg under the tongue every 5 (five) minutes as needed for chest pain.          [name] omeprazole (PRILOSEC) 20 MG capsule   Oral   Take 20 mg by mouth 2 (two) times daily.           [name] oxybutynin (DITROPAN) 5 MG tablet   Oral   Take 5 mg by mouth at bedtime.         . polyethylene glycol (MIRALAX / GLYCOLAX) packet   Oral   Take 17 g by mouth daily.   14 each   0   . potassium chloride (K-DUR) 10 MEQ tablet   Oral   Take 1 tablet (10 mEq total) by mouth daily.   30 tablet  99   . predniSONE (DELTASONE) 20 MG tablet   Oral   Take 2 tablets (40 mg total) by mouth daily with breakfast.   3 tablet   0   . sertraline (ZOLOFT) 100 MG tablet   Oral   Take 200 mg by mouth at bedtime.          . simvastatin (ZOCOR) 40 MG tablet   Oral   Take 40 mg by mouth at bedtime.         . sodium phosphate (FLEET) enema   Rectal   Place 1 enema rectally once as needed (for constipation not relieved by MOM or Bisacodyl Supp.). follow package directions         . traMADol (ULTRAM) 50 MG tablet   Oral   Take 25 mg by mouth every 8 (eight)  hours.           BP 78/41  Pulse 57  Resp 14  Ht 5\' 11"  (1.803 m)  SpO2 100%  Physical Exam  Constitutional:  Thin man, not responsive  HENT:  Bruising over left temple; eggs noted in mouth  Eyes:  Pinpoint pupils bilaterally  Cardiovascular: Normal rate and regular rhythm.   Pulmonary/Chest:  Cheney-Stokes pattern    ED Course  INTUBATION Date/Time: 12/29/2012 2:37 PM Performed by: Nelia Shi Authorized by: Nelia Shi Consent: Verbal consent obtained. written consent not obtained. Consent given by: spouse Patient identity confirmed: arm band Indications: respiratory failure and airway protection Intubation method: video-assisted Patient status: paralyzed (RSI) Preoxygenation: BVM Sedatives: etomidate Paralytic: succinylcholine Laryngoscope size: Miller 4 Tube size: 7.5 mm Tube type: cuffed Number of attempts: 1 Cricoid pressure: no Cords visualized: yes Post-procedure assessment: chest rise and CO2 detector Breath sounds: equal Cuff inflated: yes ETT to lip: 24 cm Tube secured with: ETT holder Chest x-ray interpreted by me. Chest x-ray findings: endotracheal tube too low Tube repositioned: tube repositioned successfully Patient tolerance: Patient tolerated the procedure well with no immediate complications.   (including critical care time)  Labs Reviewed  COMPREHENSIVE METABOLIC PANEL  CBC WITH DIFFERENTIAL  PROTIME-INR  URINALYSIS, ROUTINE W REFLEX MICROSCOPIC   Ct Head Wo Contrast  12/29/2012  *RADIOLOGY REPORT*  Clinical Data: Altered mental status  CT HEAD WITHOUT CONTRAST  Technique:  Contiguous axial images were obtained from the base of the skull through the vertex without contrast.  Comparison: CT 11/16/2012  Findings: Moderate atrophy.  Chronic microvascular ischemic changes in the white matter are stable from prior study.  No acute infarct. Negative for hemorrhage or mass.  No midline shift.  Calvarium is intact.  There is chronic  sinusitis, most prominent in the right maxillary sinus.  No acute bony abnormality.  IMPRESSION: Atrophy and chronic microvascular ischemia.  No acute intracranial abnormality.  Chronic sinusitis.   Original Report Authenticated By: Janeece Riggers, M.D.    Dg Chest Portable 1 View  12/29/2012  *RADIOLOGY REPORT*  Clinical Data: Altered mental status, ETT adjustment  PORTABLE CHEST - 1 VIEW  Comparison: None.  Findings: Endotracheal tube terminates 3 cm above the carina.  Patchy right upper lobe opacity, suspicious for pneumonia.  Patchy bibasilar opacities, likely atelectasis.  Patchy/nodular opacity in the lateral left mid lung, suspicious for neoplasm on prior CT. No pleural effusion or pneumothorax.  Mild cardiomegaly.  IMPRESSION: Endotracheal tube terminates 3 cm above the carina.  Otherwise unchanged.   Original Report Authenticated By: Charline Bills, M.D.    Dg Chest Portable 1 View  12/29/2012  *RADIOLOGY  REPORT*  Clinical Data: Altered mental status, ETT placement  PORTABLE CHEST - 1 VIEW  Comparison: CT chest dated 11/14/2012  Findings: Endotracheal tube terminates 1 cm above the carina.  Mild patchy right upper lobe opacity, suspicious for pneumonia. Patchy bibasilar opacities, possibly atelectasis.  Nodular opacity in the left mid lung, corresponding to possible malignancy on prior CT.  No pleural effusion or pneumothorax.  Mild cardiomegaly.  IMPRESSION: Endotracheal tube terminates 1 cm above the carina.  Consider withdrawal approximately 2 cm.  Mild patchy right upper lobe opacity, suspicious for pneumonia.  Nodular opacity in the left mid lung, corresponding to possible malignancy on prior CT.   Original Report Authenticated By: Charline Bills, M.D.      No diagnosis found.  1310 - Intubated by Dr. Radford Pax for CT scan and further evaluation after discussion with wife.  Patient does have a DNR order; however, his wife wanted intubation to allow further evaluation to identify if there was a  reversible cause.  MDM  DDx includes brain herniation (metastatic vs subdural bleed) and stroke. Head CT was unremarkable for any acute bleed.  After reviewing his chart from a month ago he apparently had metastatic disease which was quite extensive.  This finding along with his initial presentation in Cheyne-Stokes is very ominous for a poor outcome.  I discussed this with his wife and would not do chest compressions should his heart stop.  Further discussion about removing from the ventilator will occur at a later time.  PCCM consulted and will admit the patient.  BOOTH, Darreld Hoffer 12/29/2012, 1500       Phebe Colla, MD 12/30/12 1536

## 2012-12-29 NOTE — ED Notes (Signed)
Cindee Lame, NP with Critical Care placing central line

## 2012-12-29 NOTE — Procedures (Signed)
Central Venous Catheter Insertion Procedure Note KAIYU MIRABAL [id number] [birth year]  Procedure: Insertion of Central Venous Catheter Indications: Assessment of intravascular volume, Drug and/or fluid administration and Frequent blood sampling  Procedure Details Consent: Risks of procedure as well as the alternatives and risks of each were explained to the (patient/caregiver).  Consent for procedure obtained. Time Out: Verified patient identification, verified procedure, site/side was marked, verified correct patient position, special equipment/implants available, medications/allergies/relevent history reviewed, required imaging and test results available.  Performed  Maximum sterile technique was used including antiseptics, cap, gloves, gown, hand hygiene, mask and sheet. Skin prep: Chlorhexidine; local anesthetic administered A antimicrobial bonded/coated triple lumen catheter was placed in the left internal jugular vein using the Seldinger technique to 22 cm.  Evaluation Blood flow good Complications: No apparent complications Patient did tolerate procedure well. Chest X-ray ordered to verify placement.  CXR: line in acceptable position.   Canary Brim, NP-C [facility] Pgr: [phone] or [phone]   12/29/2012, 4:24 PM  I was present for procedure.  Coralyn Helling, MD [facility] Pulmonary/Critical Care 12/29/2012, 5:21 PM Pager:  [phone] After 3pm call: [phone]

## 2012-12-29 NOTE — Progress Notes (Signed)
eLink Physician-Brief Progress Note Patient Name: Guy [name] DOB: July 09, 1920 MRN: [id number]  Date of Service  12/29/2012   HPI/Events of Note   Pos trop  eICU Interventions  ASA 325   Intervention Category Intermediate Interventions: Diagnostic test evaluation  Bruchy Mikel V. 12/29/2012, 6:34 PM

## 2012-12-29 NOTE — ED Notes (Signed)
Report called to floor

## 2012-12-29 NOTE — H&P (Signed)
PULMONARY  / CRITICAL CARE MEDICINE  Name: Guy [name] MRN: [id number] DOB: [birth year]    ADMISSION DATE:  12/29/2012 CONSULTATION DATE:  12/29/12  REFERRING MD :  EDP - Dr. Radford Pax  CHIEF COMPLAINT:  AMS, Acute Respiratory Failure  BRIEF PATIENT DESCRIPTION: 79 y/o M with PMH of HTN, DM, Prostate Cancer s/p prostatectomy with lung metastasis who as of late has been in SNF after kyphoplasty fo rehab efforts presented to [facility] ER on 4/1 with AMS and acute respiratory failure.  Intubated in ER, PCCM called for ICU admit.  Concern for CVA / aspiration.     SIGNIFICANT EVENTS / STUDIES:  4/01 - admit to [facility] with AMS, acute respiratory failure requiring intubation  LINES / TUBES: 4/1 OETT>>> 4/1 L IJ TLC>>>   CULTURES: 4/1 Urine >> 4/1 Sputum >> 4/1 Blood >>   ANTIBIOTICS:  PCN ALLERGIC Aztreonam 4/1>>> Levaquin 4/1>>> Vanco 4/1>>>  HISTORY OF PRESENT ILLNESS:  79 y/o M with PMH of HTN, DM, Prostate Cancer s/p prostatectomy with lung metastasis who as of late has been in SNF after kyphoplasty for rehab efforts presented to [facility] ER on 4/1 with AMS and acute respiratory failure.  Family indicated he was in his usual state of health yesterday.  He was noted to be more sleepy this morning while being fed breakfast at 7 am.  He was to go to doctor's office at 9 am for steroid injection for his back.  His wife had trouble arousing him, and he was noted to still have his breakfast in his mouth.  He was brought to the ER.  He was intubated for airway protection.  There was concern for CVA and he had CT head which was negative.  He was hypotensive, and chest xray showed infiltrate consistent with pneumonia.  He was started on antibiotics, CVL placed, cultures sent, and he was given IV fluids.  Neurology was called to further assess for stroke.  Of note is pt is on chronic prednisone.  PAST MEDICAL HISTORY :  Past Medical History  Diagnosis Date  . Hypertension   . Diabetes mellitus without  complication   . Arthritis   . Sciatica   . GERD (gastroesophageal reflux disease)   . Bladder cancer   . Urge incontinence   . Prostate cancer     S/P prostatectomy; Lung metastasis  . Vertigo   . Hyperlipidemia   . ED (erectile dysfunction)   . Gastric polyposis   . Osteoporosis   . Decreased libido   . Depression   . Hepatitis A   . CAD (coronary artery disease)   . Stroke   . History of blood transfusion     1946  . OA (osteoarthritis)    Past Surgical History  Procedure Laterality Date  . Coronary angioplasty with stent placement  1996  . Carpal tunnel release    . Cataract extraction w/ intraocular lens  implant, bilateral  1998  . Prostatectomy    . Urinary sphincter implant    . Urinary sphincter implant revision    . Appendectomy    . Left hip surgery      Donated bone for bone graft to arm  . Penile prosthesis implant      S/P removal and re-implantation of new prosthesis  . Middle ear surgery    . Finger surgery      Left and right  . Hernia repair    . Bladder surgery    . Right arm bone graft  Pathological fracture   Prior to Admission medications   Medication Sig Start Date End Date Taking? Authorizing Provider  acetaminophen (TYLENOL) 325 MG tablet Take 325 mg by mouth daily.    Yes Historical Provider, MD  bicalutamide (CASODEX) 50 MG tablet Take 50 mg by mouth daily.   Yes Historical Provider, MD  bisacodyl (DULCOLAX) 10 MG suppository Place 10 mg rectally as needed for constipation (for constipation not relieved by MOM).   Yes Historical Provider, MD  Calcium Carbonate-Vitamin D (CALCIUM 600+D HIGH POTENCY) 600-400 MG-UNIT per tablet Take 1 tablet by mouth 2 (two) times daily.     Yes Historical Provider, MD  cycloSPORINE (RESTASIS) 0.05 % ophthalmic emulsion Place 1 drop into both eyes 2 (two) times daily.     Yes Historical Provider, MD  docusate sodium (COLACE) 100 MG capsule Take 100 mg by mouth 2 (two) times daily.    Yes Historical  Provider, MD  furosemide (LASIX) 20 MG tablet Take 1 tablet (20 mg total) by mouth daily. 12/28/12  Yes Sharee Holster, NP  gabapentin (NEURONTIN) 100 MG capsule Take 100 mg by mouth 3 (three) times daily.    Yes Historical Provider, MD  glimepiride (AMARYL) 4 MG tablet Take 4 mg by mouth daily before breakfast.     Yes Historical Provider, MD  insulin aspart (NOVOLOG) 100 UNIT/ML injection Inject 8 Units into the skin once.   Yes Historical Provider, MD  insulin glargine (LANTUS) 100 UNIT/ML injection Inject 14 Units into the skin at bedtime. 11/23/12  Yes Belkys A Regalado, MD  loratadine (CLARITIN) 10 MG tablet Take 10 mg by mouth daily as needed for allergies.    Yes Historical Provider, MD  losartan (COZAAR) 50 MG tablet Take 50 mg by mouth every morning.     Yes Historical Provider, MD  magnesium hydroxide (MILK OF MAGNESIA) 400 MG/5ML suspension Take 30 mLs by mouth daily as needed for constipation.   Yes Historical Provider, MD  meclizine (ANTIVERT) 25 MG tablet Take 25 mg by mouth 3 (three) times daily as needed for dizziness.    Yes Historical Provider, MD  metoprolol (LOPRESSOR) 50 MG tablet Take 25 mg by mouth 2 (two) times daily.     Yes Historical Provider, MD  Multiple Vitamins-Minerals (MULTIVITAMINS THER. W/MINERALS) TABS Take 1 tablet by mouth every morning.    Yes Historical Provider, MD  naproxen sodium (ANAPROX) 220 MG tablet Take 440 mg by mouth 2 (two) times daily with a meal.   Yes Historical Provider, MD  nitroGLYCERIN (NITROSTAT) 0.4 MG SL tablet Place 0.4 mg under the tongue every 5 (five) minutes as needed for chest pain.    Yes Historical Provider, MD  omeprazole (PRILOSEC) 20 MG capsule Take 20 mg by mouth 2 (two) times daily.     Yes Historical Provider, MD  oxybutynin (DITROPAN) 5 MG tablet Take 5 mg by mouth at bedtime.   Yes Historical Provider, MD  polyethylene glycol (MIRALAX / GLYCOLAX) packet Take 17 g by mouth daily. 11/11/12  Yes Amber Nydia Bouton, MD  potassium  chloride (K-DUR) 10 MEQ tablet Take 1 tablet (10 mEq total) by mouth daily. 12/28/12  Yes Sharee Holster, NP  predniSONE (DELTASONE) 20 MG tablet Take 2 tablets (40 mg total) by mouth daily with breakfast. 11/23/12  Yes Belkys A Regalado, MD  sertraline (ZOLOFT) 100 MG tablet Take 200 mg by mouth at bedtime.    Yes Historical Provider, MD  simvastatin (ZOCOR) 40 MG tablet Take 40 mg  by mouth at bedtime.   Yes Historical Provider, MD  sodium phosphate (FLEET) enema Place 1 enema rectally once as needed (for constipation not relieved by MOM or Bisacodyl Supp.). follow package directions   Yes Historical Provider, MD  traMADol (ULTRAM) 50 MG tablet Take 25 mg by mouth every 8 (eight) hours.   Yes Historical Provider, MD   Allergies  Allergen Reactions  . Albuterol Sulfate Hfa (ZOX:[id number]) Shortness Of Breath  . Adhesive (Tape) Other (See Comments)    REACTION: SKIN BLISTERS  . Penicillins Other (See Comments)    REACTION: ITCHING HANDS  . Sulfa Drugs Cross Reactors Hives    FAMILY HISTORY:  Family History  Problem Relation Age of Onset  . Heart failure Mother   . Bladder Cancer Father   . Pancreatic cancer Sister   . Lung cancer Sister   . Breast cancer Daughter   . Liver disease Son    SOCIAL HISTORY:  reports that he has quit smoking. He has never used smokeless tobacco. He reports that he does not drink alcohol or use illicit drugs.  REVIEW OF SYSTEMS:   Unable to obtain due to patient mental status.  SUBJECTIVE:   VITAL SIGNS: Temp:  [96.4 F (35.8 C)-98.6 F (37 C)] 98.6 F (37 C) (04/01 1515) Pulse Rate:  [57-66] 57 (04/01 1515) Resp:  [14-18] 17 (04/01 1515) BP: (68-97)/(35-64) 76/37 mmHg (04/01 1515) SpO2:  [84 %-100 %] 100 % (04/01 1515) FiO2 (%):  [100 %] 100 % (04/01 1339) HEMODYNAMICS:   VENTILATOR SETTINGS: Vent Mode:  [-] PRVC FiO2 (%):  [100 %] 100 % Set Rate:  [14 bmp] 14 bmp Vt Set:  [600 mL] 600 mL PEEP:  [5 cmH20] 5 cmH20 Plateau Pressure:  [17  cmH20] 17 cmH20 INTAKE / OUTPUT: Intake/Output   None     PHYSICAL EXAMINATION: General:  Ill appearing Neuro:  Sedated HEENT:  Pupils mid point and weakly reactive, ETT in place, no LAN Cardiovascular:  s1s2 regular, bradycardic, no murmur Lungs:  Coarse breath sounds b/l, no wheeze Abdomen:  Soft, scar lower abdomen, no organomegaly, decreased bowel sounds Musculoskeletal:  1+ leg edema Skin:  No rashes  LABS:  Recent Labs Lab 12/29/12 1431 12/29/12 1455  HGB 8.4*  --   WBC 12.0*  --   PLT 71*  --   NA 140  --   K 3.1*  --   CL 108  --   CO2 21  --   GLUCOSE 94  --   BUN 31*  --   CREATININE 1.78*  --   CALCIUM 7.5*  --   AST 38*  --   ALT 16  --   ALKPHOS 104  --   BILITOT 0.7  --   PROT 4.8*  --   ALBUMIN 1.7*  --   INR 1.66*  --   LATICACIDVEN  --  3.22*   No results found for this basename: GLUCAP,  in the last 168 hours  Imaging: Ct Head Wo Contrast  12/29/2012  *RADIOLOGY REPORT*  Clinical Data: Altered mental status  CT HEAD WITHOUT CONTRAST  Technique:  Contiguous axial images were obtained from the base of the skull through the vertex without contrast.  Comparison: CT 11/16/2012  Findings: Moderate atrophy.  Chronic microvascular ischemic changes in the white matter are stable from prior study.  No acute infarct. Negative for hemorrhage or mass.  No midline shift.  Calvarium is intact.  There is chronic sinusitis, most prominent in the  right maxillary sinus.  No acute bony abnormality.  IMPRESSION: Atrophy and chronic microvascular ischemia.  No acute intracranial abnormality.  Chronic sinusitis.   Original Report Authenticated By: Janeece Riggers, M.D.    Dg Chest Portable 1 View  12/29/2012  *RADIOLOGY REPORT*  Clinical Data: Central line placement.  PORTABLE CHEST - 1 VIEW  Comparison: 12/29/2012 1:17 p.m.  Findings: Endotracheal tube tip 2.3 cm above the carina.  Left central line has been placed and the tip is at the level of the distal superior vena cava.   No gross pneumothorax.  Mediastinum appears prominent.  There is may be related to the right upper lobe atelectasis/consolidation.  Primary mediastinal abnormality not excluded.  Pulmonary vascular congestion most notable centrally.  Cardiomegaly.  IMPRESSION: Left central line has been placed and the tip is at the level of the distal superior vena cava.  No gross pneumothorax.  Mediastinum appears prominent.  There is may be related to the right upper lobe atelectasis/consolidation.  Primary mediastinal abnormality not excluded.  Pulmonary vascular congestion most notable centrally.  Cardiomegaly.  This is a call report.   Original Report Authenticated By: Lacy Duverney, M.D.    Dg Chest Portable 1 View  12/29/2012  *RADIOLOGY REPORT*  Clinical Data: Altered mental status, ETT adjustment  PORTABLE CHEST - 1 VIEW  Comparison: None.  Findings: Endotracheal tube terminates 3 cm above the carina.  Patchy right upper lobe opacity, suspicious for pneumonia.  Patchy bibasilar opacities, likely atelectasis.  Patchy/nodular opacity in the lateral left mid lung, suspicious for neoplasm on prior CT. No pleural effusion or pneumothorax.  Mild cardiomegaly.  IMPRESSION: Endotracheal tube terminates 3 cm above the carina.  Otherwise unchanged.   Original Report Authenticated By: Charline Bills, M.D.    Dg Chest Portable 1 View  12/29/2012  *RADIOLOGY REPORT*  Clinical Data: Altered mental status, ETT placement  PORTABLE CHEST - 1 VIEW  Comparison: CT chest dated 11/14/2012  Findings: Endotracheal tube terminates 1 cm above the carina.  Mild patchy right upper lobe opacity, suspicious for pneumonia. Patchy bibasilar opacities, possibly atelectasis.  Nodular opacity in the left mid lung, corresponding to possible malignancy on prior CT.  No pleural effusion or pneumothorax.  Mild cardiomegaly.  IMPRESSION: Endotracheal tube terminates 1 cm above the carina.  Consider withdrawal approximately 2 cm.  Mild patchy right upper  lobe opacity, suspicious for pneumonia.  Nodular opacity in the left mid lung, corresponding to possible malignancy on prior CT.   Original Report Authenticated By: Charline Bills, M.D.      ASSESSMENT / PLAN:  PULMONARY A: Acute respiratory failure 2nd to altered mental status and pneumonia. P:   Full vent support F/u CXR and ABG Oxygen to keep SpO2 > 92%  CARDIOVASCULAR A: Shock likely from sepsis with pneumonia. Hx of HTN, CAD. P:  Fluids to keep CVP 8 to 12 Pressors to keep MAP > 65 F/u cardiac enzymes Monitor heart rhythm Hold outpt lasix, cozaar, lopressor, zocor  RENAL A:  CKD. Hypokalemia. Lactic acidosis. P:   Monitor renal fx, urine outpt F/u and replace electrolytes as needed  GASTROINTESTINAL A:  Nutrition. Hx of GERD. P:   Tube feeds if unable to wean from vent soon Protonix for SUP  HEMATOLOGIC A:  Anemia of chronic disease. Thrombocytopenia. Hx of metastatic prostate cancer. P:  F/u DIC panel Hold heparin for now SCD for DVT prevention  INFECTIOUS A:  HCAP. Possible UTI. Hx of PCN allergy. P:   Levaquin, azactam, vancomycin pending culture  results  ENDOCRINE A:  Diabetes type II. Relative adrenal insufficiency 2nd to chronic prednisone use. P:   SSI Solucortef 50 mg q6h Hold outpt amaryl, lantus  NEUROLOGIC A:  Acute encephalopathy with concern for acute stroke. Hx of depression, chronic back pain. P:   Neurology consulted  Hold aspirin due to thrombocytopenia  F/u TSH Hold outpt neurontin, zoloft  GOALS of CARE  A: Limited resuscitation. P: No CPR, no defibrillation   CC time 50 minutes.  Coralyn Helling, MD [facility] Pulmonary/Critical Care 12/29/2012, 5:18 PM Pager:  [phone] After 3pm call: [phone]

## 2012-12-29 NOTE — ED Notes (Signed)
Troponin level reported to CCM MD

## 2012-12-29 NOTE — ED Notes (Signed)
Dr.Beaton in with pts wife

## 2012-12-29 NOTE — Progress Notes (Signed)
Chaplain Note:  Chaplain visited with pt and pt's family.  Pt was in trauma bay, intubated, sedated, and did not communicate during this visit. Pt's wife and nurse were at bedside.  Pt's daughter arrived later in the visit.  Chaplain provided spiritual comfort and support for pt, pt's family, and ED staff.  All expressed appreciation for chaplain support.  Chaplain will follow up as needed.  12/29/12 1500  Clinical Encounter Type  Visited With Patient and family together  Visit Type Spiritual support;Patient actively dying  Referral From Nurse  Consult/Referral To Chaplain  Recommendations Continued spiritual support this PM  Spiritual Encounters  Spiritual Needs Emotional;Grief support  Stress Factors  Patient Stress Factors Health changes;Major life changes  Family Stress Factors Loss;Major life changes  Verdie Shire, [address] [phone]

## 2012-12-29 NOTE — Progress Notes (Signed)
ANTIBIOTIC CONSULT NOTE - INITIAL  Pharmacy Consult for vancomycin, aztreonam, levaquin Indication: sepsis  Allergies  Allergen Reactions  . Albuterol Sulfate Hfa (ION:[id number]) Shortness Of Breath  . Adhesive (Tape) Other (See Comments)    REACTION: SKIN BLISTERS  . Penicillins Other (See Comments)    REACTION: ITCHING HANDS  . Sulfa Drugs Cross Reactors Hives    Patient Measurements: Height: 5\' 11"  (180.3 cm) IBW/kg (Calculated) : 75.3   Vital Signs: Temp: 98.6 F (37 C) (04/01 1515) BP: 76/37 mmHg (04/01 1515) Pulse Rate: 57 (04/01 1515) Intake/Output from previous day:   Intake/Output from this shift:    Labs:  Recent Labs  12/29/12 1431  WBC 12.0*  HGB 8.4*  PLT 71*  CREATININE 1.78*   The CrCl is unknown because both a height and weight (above a minimum accepted value) are required for this calculation. No results found for this basename: VANCOTROUGH, VANCOPEAK, VANCORANDOM, GENTTROUGH, GENTPEAK, GENTRANDOM, TOBRATROUGH, TOBRAPEAK, TOBRARND, AMIKACINPEAK, AMIKACINTROU, AMIKACIN,  in the last 72 hours   Microbiology: No results found for this or any previous visit (from the past 720 hour(s)).  Medical History: Past Medical History  Diagnosis Date  . Hypertension   . Diabetes mellitus without complication   . Arthritis   . Sciatica   . GERD (gastroesophageal reflux disease)   . Bladder cancer   . Urge incontinence   . Prostate cancer     S/P prostatectomy; Lung metastasis  . Vertigo   . Hyperlipidemia   . ED (erectile dysfunction)   . Gastric polyposis   . Osteoporosis   . Decreased libido   . Depression   . Hepatitis A   . CAD (coronary artery disease)   . Stroke   . History of blood transfusion     1946  . OA (osteoarthritis)     Medications:  See med rec   Assessment: Patient is a 79 y.o M presented to the ED from [facility] secondary to AMS.  Patient was s/p fall 2 weeks ago and was at PCP office this morning.   He received a  steroid injection for his back and became unresponsive when he returned to SNF.  Now hypotensive and is intubated.  To start broad spectrum abx for suspected sepsis. Scr 1.78 (est crcl~ 27)  Goal of Therapy:  Vancomycin trough level 15-20 mcg/ml  Plan:  1) vancomycin 1gm IV x1 now, then 750 mg Iv q24h 2) Levaquin 750mg  IV q48h 3) aztreonam 1 gm IV q8h  Penny Frisbie P 12/29/2012,4:19 PM

## 2012-12-29 NOTE — ED Notes (Signed)
Pt wife in room. Requesting all measures be taken to help pt.

## 2012-12-29 NOTE — Progress Notes (Signed)
CRITICAL VALUE ALERT  Critical value received: Positive MRSA PCR  Date of notification:  12/29/2012  Time of notification:  2230   Critical value read back:yes  Nurse who received alert:  Truddie Coco, RN  MD notified (1st page):  Dr. Vassie Loll  Time of first page:  2230  MD notified (2nd page):  Time of second page:  Responding MD:  Dr. Vassie Loll  Time MD responded:  2230

## 2012-12-30 ENCOUNTER — Inpatient Hospital Stay (HOSPITAL_COMMUNITY): Payer: Medicare Other

## 2012-12-30 DIAGNOSIS — R188 Other ascites: Secondary | ICD-10-CM

## 2012-12-30 DIAGNOSIS — J9600 Acute respiratory failure, unspecified whether with hypoxia or hypercapnia: Secondary | ICD-10-CM

## 2012-12-30 DIAGNOSIS — G9340 Encephalopathy, unspecified: Secondary | ICD-10-CM

## 2012-12-30 DIAGNOSIS — N179 Acute kidney failure, unspecified: Secondary | ICD-10-CM

## 2012-12-30 LAB — LIPID PANEL
Cholesterol: 80 mg/dL (ref 0–200)
HDL: 26 mg/dL — ABNORMAL LOW
LDL Cholesterol: 37 mg/dL (ref 0–99)
Total CHOL/HDL Ratio: 3.1 ratio
Triglycerides: 85 mg/dL
VLDL: 17 mg/dL (ref 0–40)

## 2012-12-30 LAB — TROPONIN I
Troponin I: 2.91 ng/mL (ref <0.30)
Troponin I: 2.46 ng/mL (ref <0.30)

## 2012-12-30 LAB — CBC
HCT: 24.2 % — ABNORMAL LOW (ref 39.0–52.0)
Hemoglobin: 8.3 g/dL — ABNORMAL LOW (ref 13.0–17.0)
MCHC: 34.3 g/dL (ref 30.0–36.0)
MCV: 93.1 fL (ref 78.0–100.0)

## 2012-12-30 LAB — GLUCOSE, CAPILLARY
Glucose-Capillary: 160 mg/dL — ABNORMAL HIGH (ref 70–99)
Glucose-Capillary: 162 mg/dL — ABNORMAL HIGH (ref 70–99)
Glucose-Capillary: 193 mg/dL — ABNORMAL HIGH (ref 70–99)
Glucose-Capillary: 241 mg/dL — ABNORMAL HIGH (ref 70–99)

## 2012-12-30 LAB — CORTISOL: Cortisol, Plasma: 30.8 ug/dL

## 2012-12-30 LAB — LEGIONELLA ANTIGEN, URINE: Legionella Antigen, Urine: NEGATIVE

## 2012-12-30 LAB — LACTIC ACID, PLASMA: Lactic Acid, Venous: 1.5 mmol/L (ref 0.5–2.2)

## 2012-12-30 LAB — BASIC METABOLIC PANEL
BUN: 38 mg/dL — ABNORMAL HIGH (ref 6–23)
Creatinine, Ser: 1.71 mg/dL — ABNORMAL HIGH (ref 0.50–1.35)
GFR calc non Af Amer: 33 mL/min — ABNORMAL LOW (ref >90)
Glucose, Bld: 189 mg/dL — ABNORMAL HIGH (ref 70–99)
Potassium: 3.8 mEq/L (ref 3.5–5.1)

## 2012-12-30 MED ORDER — CHLORHEXIDINE GLUCONATE 0.12 % MT SOLN
15.0000 mL | Freq: Two times a day (BID) | OROMUCOSAL | Status: DC
  Administered 2012-12-30 – 2013-01-04 (×10): 15 mL via OROMUCOSAL
  Filled 2012-12-30 (×12): qty 15

## 2012-12-30 MED ORDER — CHLORHEXIDINE GLUCONATE CLOTH 2 % EX PADS
6.0000 | MEDICATED_PAD | Freq: Every day | CUTANEOUS | Status: AC
  Administered 2012-12-31 – 2013-01-03 (×5): 6 via TOPICAL

## 2012-12-30 MED ORDER — BIOTENE DRY MOUTH MT LIQD
15.0000 mL | Freq: Four times a day (QID) | OROMUCOSAL | Status: DC
  Administered 2012-12-30 – 2013-01-04 (×16): 15 mL via OROMUCOSAL

## 2012-12-30 MED ORDER — MUPIROCIN 2 % EX OINT
1.0000 "application " | TOPICAL_OINTMENT | Freq: Two times a day (BID) | CUTANEOUS | Status: AC
  Administered 2012-12-30 – 2013-01-03 (×10): 1 via NASAL
  Filled 2012-12-30: qty 22

## 2012-12-30 NOTE — Care Management Note (Signed)
    Page 1 of 1   12/30/2012     10:50:15 AM   CARE MANAGEMENT NOTE 12/30/2012  Patient:  Guy [name], Guy [name]   Account Number:  [id number]  Date Initiated:  12/30/2012  Documentation initiated by:  [facility]  Subjective/Objective Assessment:   Respr failure - requiring intubation.     Action/Plan:   Anticipated DC Date:  01/06/2013   Anticipated DC Plan:  SKILLED NURSING FACILITY  In-house referral  Clinical Social Worker      DC Planning Services  CM consult      Choice offered to / List presented to:             Status of service:  In process, will continue to follow Medicare Important Message given?   (If response is "NO", the following Medicare IM given date fields will be blank) Date Medicare IM given:   Date Additional Medicare IM given:    Discharge Disposition:    Per UR Regulation:  Reviewed for med. necessity/level of care/duration of stay  If discussed at Long Length of Stay Meetings, dates discussed:    Comments:  Contact:  Tim, Corriher [phone]   [phone]                Carr,Charlene Daughter [phone]  12-30-12 10:42am Avie Arenas, RNBSN [phone] ?? wife has dementia.  Husband with CA but wife doesn't seem to remember.  Daughter in rehab at SNF.  Son out of state.  From SNF - SW consult placed.

## 2012-12-30 NOTE — Procedures (Signed)
Extubation Procedure Note  Patient Details:   Name: Guy [name] DOB: Dec 04, 1919 MRN: [id number]   Airway Documentation:     Evaluation  O2 sats: stable throughout Complications: No apparent complications Patient did tolerate procedure well. Bilateral Breath Sounds: Clear Suctioning: Airway Yes  Ave Filter 12/30/2012, 2:34 PM

## 2012-12-30 NOTE — Progress Notes (Signed)
Called by primary RN to complete NIHSS.  Pt is alert & responsive but intubated.  Pt follows commands & is able to easily express his needs. No focal deficits noted.  Pt denies sensory deficit. NIHSS 0, but further assessment post- extubation needed to establish if there is any dysarthria or aphasia.

## 2012-12-30 NOTE — Progress Notes (Signed)
VASCULAR LAB PRELIMINARY  PRELIMINARY  PRELIMINARY  PRELIMINARY  Carotid duplex  completed.    Preliminary report:  Right:  No evidence of hemodynamically significant internal carotid artery stenosis.  Left:  Unable to adequately image due to IJ line and bandages.  No evidence of hemodynamically significant internal carotid artery stenosis.    Incidental finding:  Small thrombus adhered to the IJ line in the jugular vein.   Gurneet Matarese, RVT 12/30/2012, 10:11 AM

## 2012-12-30 NOTE — Progress Notes (Signed)
Dr. Tyson Alias had discussion with patient's wife and daughter and they agreed to no code blue.

## 2012-12-30 NOTE — Progress Notes (Addendum)
Subjective: Patient is still intubated and on mechanical ventilation. Mental status has improved as has his level of alertness over the past 24 hours.  Objective: Current vital signs: BP 130/58  Pulse 63  Temp(Src) 99 F (37.2 C) (Core (Comment))  Resp 23  Ht 5\' 11"  (1.803 m)  Wt 73.4 kg (161 lb 13.1 oz)  BMI 22.58 kg/m2  SpO2 100%  Neurologic Exam: Alert and in no acute distress. Patient obeys commands without difficulty. Extraocular movements were full and conjugate. No facial weakness was noted. Motor exam showed normal and symmetrical strength of upper and lower extremities. Coordination was normal bilaterally. Deep tendon reflexes were absent in lower extremities bilaterally. Plantar responses were flexor bilaterally.  Lab Results: Results for orders placed during the hospital encounter of 12/29/12 (from the past 48 hour(s))  COMPREHENSIVE METABOLIC PANEL     Status: Abnormal   Collection Time    12/29/12  2:31 PM      Result Value Range   Sodium 140  135 - 145 mEq/L   Potassium 3.1 (*) 3.5 - 5.1 mEq/L   Chloride 108  96 - 112 mEq/L   CO2 21  19 - 32 mEq/L   Glucose, Bld 94  70 - 99 mg/dL   BUN 31 (*) 6 - 23 mg/dL   Creatinine, Ser 1.61 (*) 0.50 - 1.35 mg/dL   Calcium 7.5 (*) 8.4 - 10.5 mg/dL   Total Protein 4.8 (*) 6.0 - 8.3 g/dL   Albumin 1.7 (*) 3.5 - 5.2 g/dL   AST 38 (*) 0 - 37 U/L   ALT 16  0 - 53 U/L   Alkaline Phosphatase 104  39 - 117 U/L   Total Bilirubin 0.7  0.3 - 1.2 mg/dL   GFR calc non Af Amer 32 (*) >90 mL/min   GFR calc Af Amer 37 (*) >90 mL/min   Comment:            The eGFR has been calculated     using the CKD EPI equation.     This calculation has not been     validated in all clinical     situations.     eGFR's persistently     <90 mL/min signify     possible Chronic Kidney Disease.  CBC WITH DIFFERENTIAL     Status: Abnormal   Collection Time    12/29/12  2:31 PM      Result Value Range   WBC 12.0 (*) 4.0 - 10.5 K/uL   RBC 2.61  (*) 4.22 - 5.81 MIL/uL   Hemoglobin 8.4 (*) 13.0 - 17.0 g/dL   HCT 09.6 (*) 04.5 - 40.9 %   MCV 93.9  78.0 - 100.0 fL   MCH 32.2  26.0 - 34.0 pg   MCHC 34.3  30.0 - 36.0 g/dL   RDW 81.1  91.4 - 78.2 %   Platelets 71 (*) 150 - 400 K/uL   Comment: REPEATED TO VERIFY     SPECIMEN CHECKED FOR CLOTS     PLATELETS APPEAR DECREASED     PLATELET COUNT CONFIRMED BY SMEAR   Neutrophils Relative 90 (*) 43 - 77 %   Lymphocytes Relative 4 (*) 12 - 46 %   Monocytes Relative 6  3 - 12 %   Eosinophils Relative 0  0 - 5 %   Basophils Relative 0  0 - 1 %   Neutro Abs 10.8 (*) 1.7 - 7.7 K/uL   Lymphs Abs 0.5 (*) 0.7 -  4.0 K/uL   Monocytes Absolute 0.7  0.1 - 1.0 K/uL   Eosinophils Absolute 0.0  0.0 - 0.7 K/uL   Basophils Absolute 0.0  0.0 - 0.1 K/uL   WBC Morphology TOXIC GRANULATION     Comment: INCREASED BANDS (>20% BANDS)     MILD LEFT SHIFT (1-5% METAS, OCC MYELO, OCC BANDS)     VACUOLATED NEUTROPHILS  PROTIME-INR     Status: Abnormal   Collection Time    12/29/12  2:31 PM      Result Value Range   Prothrombin Time 19.1 (*) 11.6 - 15.2 seconds   INR 1.66 (*) 0.00 - 1.49  CG4 I-STAT (LACTIC ACID)     Status: Abnormal   Collection Time    12/29/12  2:55 PM      Result Value Range   Lactic Acid, Venous 3.22 (*) 0.5 - 2.2 mmol/L  URINALYSIS, ROUTINE W REFLEX MICROSCOPIC     Status: Abnormal   Collection Time    12/29/12  3:00 PM      Result Value Range   Color, Urine YELLOW  YELLOW   APPearance CLOUDY (*) CLEAR   Specific Gravity, Urine 1.010  1.005 - 1.030   pH 6.0  5.0 - 8.0   Glucose, UA 250 (*) NEGATIVE mg/dL   Hgb urine dipstick MODERATE (*) NEGATIVE   Bilirubin Urine NEGATIVE  NEGATIVE   Ketones, ur NEGATIVE  NEGATIVE mg/dL   Protein, ur 30 (*) NEGATIVE mg/dL   Urobilinogen, UA 0.2  0.0 - 1.0 mg/dL   Nitrite NEGATIVE  NEGATIVE   Leukocytes, UA LARGE (*) NEGATIVE  URINE MICROSCOPIC-ADD ON     Status: Abnormal   Collection Time    12/29/12  3:00 PM      Result Value Range    Squamous Epithelial / LPF RARE  RARE   WBC, UA TOO NUMEROUS TO COUNT  <3 WBC/hpf   Bacteria, UA MANY (*) RARE   Casts HYALINE CASTS (*) NEGATIVE  CULTURE, BLOOD (ROUTINE X 2)     Status: None   Collection Time    12/29/12  4:45 PM      Result Value Range   Specimen Description BLOOD ARM RIGHT     Special Requests BOTTLES DRAWN AEROBIC AND ANAEROBIC 10CC     Culture  Setup Time 12/29/2012 23:40     Culture       Value:        BLOOD CULTURE RECEIVED NO GROWTH TO DATE CULTURE WILL BE HELD FOR 5 DAYS BEFORE ISSUING A FINAL NEGATIVE REPORT   Report Status PENDING    URINALYSIS, ROUTINE W REFLEX MICROSCOPIC     Status: Abnormal   Collection Time    12/29/12  4:57 PM      Result Value Range   Color, Urine AMBER (*) YELLOW   Comment: BIOCHEMICALS MAY BE AFFECTED BY COLOR   APPearance TURBID (*) CLEAR   Specific Gravity, Urine 1.018  1.005 - 1.030   pH 5.5  5.0 - 8.0   Glucose, UA 100 (*) NEGATIVE mg/dL   Hgb urine dipstick LARGE (*) NEGATIVE   Bilirubin Urine SMALL (*) NEGATIVE   Ketones, ur NEGATIVE  NEGATIVE mg/dL   Protein, ur 161 (*) NEGATIVE mg/dL   Urobilinogen, UA 1.0  0.0 - 1.0 mg/dL   Nitrite NEGATIVE  NEGATIVE   Leukocytes, UA LARGE (*) NEGATIVE  URINE MICROSCOPIC-ADD ON     Status: Abnormal   Collection Time    12/29/12  4:57 PM  Result Value Range   Squamous Epithelial / LPF FEW (*) RARE   WBC, UA TOO NUMEROUS TO COUNT  <3 WBC/hpf   RBC / HPF 11-20  <3 RBC/hpf   Bacteria, UA MANY (*) RARE   Casts GRANULAR CAST (*) NEGATIVE  CULTURE, BLOOD (ROUTINE X 2)     Status: None   Collection Time    12/29/12  5:00 PM      Result Value Range   Specimen Description BLOOD HAND RIGHT     Special Requests BOTTLES DRAWN AEROBIC AND ANAEROBIC 10CC     Culture  Setup Time 12/29/2012 23:40     Culture       Value:        BLOOD CULTURE RECEIVED NO GROWTH TO DATE CULTURE WILL BE HELD FOR 5 DAYS BEFORE ISSUING A FINAL NEGATIVE REPORT   Report Status PENDING    CBC     Status:  Abnormal   Collection Time    12/29/12  5:00 PM      Result Value Range   WBC 13.3 (*) 4.0 - 10.5 K/uL   RBC 2.85 (*) 4.22 - 5.81 MIL/uL   Hemoglobin 9.0 (*) 13.0 - 17.0 g/dL   HCT 16.1 (*) 09.6 - 04.5 %   MCV 95.4  78.0 - 100.0 fL   MCH 31.6  26.0 - 34.0 pg   MCHC 33.1  30.0 - 36.0 g/dL   RDW 40.9  81.1 - 91.4 %   Platelets 77 (*) 150 - 400 K/uL   Comment: CONSISTENT WITH PREVIOUS RESULT  COMPREHENSIVE METABOLIC PANEL     Status: Abnormal   Collection Time    12/29/12  5:00 PM      Result Value Range   Sodium 139  135 - 145 mEq/L   Potassium 3.6  3.5 - 5.1 mEq/L   Chloride 108  96 - 112 mEq/L   CO2 21  19 - 32 mEq/L   Glucose, Bld 131 (*) 70 - 99 mg/dL   BUN 31 (*) 6 - 23 mg/dL   Creatinine, Ser 7.82 (*) 0.50 - 1.35 mg/dL   Calcium 7.6 (*) 8.4 - 10.5 mg/dL   Total Protein 5.2 (*) 6.0 - 8.3 g/dL   Albumin 1.8 (*) 3.5 - 5.2 g/dL   AST 44 (*) 0 - 37 U/L   ALT 19  0 - 53 U/L   Alkaline Phosphatase 116  39 - 117 U/L   Total Bilirubin 0.6  0.3 - 1.2 mg/dL   GFR calc non Af Amer 31 (*) >90 mL/min   GFR calc Af Amer 36 (*) >90 mL/min   Comment:            The eGFR has been calculated     using the CKD EPI equation.     This calculation has not been     validated in all clinical     situations.     eGFR's persistently     <90 mL/min signify     possible Chronic Kidney Disease.  LACTIC ACID, PLASMA     Status: Abnormal   Collection Time    12/29/12  5:00 PM      Result Value Range   Lactic Acid, Venous 3.0 (*) 0.5 - 2.2 mmol/L  CORTISOL     Status: None   Collection Time    12/29/12  5:00 PM      Result Value Range   Cortisol, Plasma 30.8     Comment: (NOTE)  AM:  4.3 - 22.4 ug/dL     PM:  3.1 - 16.1 ug/dL  PROCALCITONIN     Status: None   Collection Time    12/29/12  5:00 PM      Result Value Range   Procalcitonin 50.92     Comment:            Interpretation:     PCT >= 10 ng/mL:     Important systemic inflammatory response,     almost exclusively due to  severe bacterial     sepsis or septic shock.     (NOTE)             ICU PCT Algorithm               Non ICU PCT Algorithm        ----------------------------     ------------------------------             PCT < 0.25 ng/mL                 PCT < 0.1 ng/mL         Stopping of antibiotics            Stopping of antibiotics           strongly encouraged.               strongly encouraged.        ----------------------------     ------------------------------           PCT level decrease by               PCT < 0.25 ng/mL           >= 80% from peak PCT           OR PCT 0.25 - 0.5 ng/mL          Stopping of antibiotics                                                 encouraged.         Stopping of antibiotics               encouraged.        ----------------------------     ------------------------------           PCT level decrease by              PCT >= 0.25 ng/mL           < 80% from peak PCT            AND PCT >= 0.5 ng/mL            Continuing antibiotics                                                  encouraged.           Continuing antibiotics                encouraged.        ----------------------------     ------------------------------         PCT level increase compared  PCT > 0.5 ng/mL             with peak PCT AND              PCT >= 0.5 ng/mL             Escalation of antibiotics                                              strongly encouraged.          Escalation of antibiotics            strongly encouraged.  TROPONIN I     Status: Abnormal   Collection Time    12/29/12  5:00 PM      Result Value Range   Troponin I 2.43 (*) <0.30 ng/mL   Comment:            Due to the release kinetics of cTnI,     a negative result within the first hours     of the onset of symptoms does not rule out     myocardial infarction with certainty.     If myocardial infarction is still suspected,     repeat the test at appropriate intervals.     CRITICAL RESULT CALLED TO, READ  BACK BY AND VERIFIED WITH:     Rexene Edison [id number] [address], S     CALLED TO GATE, J. RN [phone] AT [address] [place], [place]  PROTIME-INR     Status: Abnormal   Collection Time    12/29/12  5:00 PM      Result Value Range   Prothrombin Time 17.8 (*) 11.6 - 15.2 seconds   INR 1.51 (*) 0.00 - 1.49  APTT     Status: None   Collection Time    12/29/12  5:00 PM      Result Value Range   aPTT 37  24 - 37 seconds   Comment:            IF BASELINE aPTT IS ELEVATED,     SUGGEST PATIENT RISK ASSESSMENT     BE USED TO DETERMINE APPROPRIATE     ANTICOAGULANT THERAPY.  FIBRINOGEN     Status: None   Collection Time    12/29/12  5:00 PM      Result Value Range   Fibrinogen 349  204 - 475 mg/dL  TYPE AND SCREEN     Status: None   Collection Time    12/29/12  5:00 PM      Result Value Range   ABO/RH(D) O POS     Antibody Screen POS     Sample Expiration 01/01/2013     Antibody Identification ANTI-K     DAT, IgG NEG     PT AG Type NEGATIVE FOR KELL ANTIGEN    TSH     Status: None   Collection Time    12/29/12  5:10 PM      Result Value Range   TSH 1.660  0.350 - 4.500 uIU/mL  POCT I-STAT 3, BLOOD GAS (G3+)     Status: Abnormal   Collection Time    12/29/12  6:36 PM      Result Value Range   pH, Arterial 7.373  7.350 - 7.450   pCO2 arterial 35.0  35.0 - 45.0 mmHg   pO2, Arterial  332.0 (*) 80.0 - 100.0 mmHg   Bicarbonate 20.4  20.0 - 24.0 mEq/L   TCO2 21  0 - 100 mmol/L   O2 Saturation 100.0     Acid-base deficit 4.0 (*) 0.0 - 2.0 mmol/L   Patient temperature 37.0 C     Collection site RADIAL, ALLEN'S TEST ACCEPTABLE     Drawn by Operator     Sample type ARTERIAL    GLUCOSE, CAPILLARY     Status: Abnormal   Collection Time    12/29/12  6:57 PM      Result Value Range   Glucose-Capillary 188 (*) 70 - 99 mg/dL  STREP PNEUMONIAE URINARY ANTIGEN     Status: None   Collection Time    12/29/12  7:21 PM      Result Value Range   Strep Pneumo Urinary Antigen NEGATIVE   NEGATIVE   Comment:            Infection due to S. pneumoniae     cannot be absolutely ruled out     since the antigen present     may be below the detection limit     of the test.  DIC (DISSEMINATED INTRAVASCULAR COAGULATION) PANEL     Status: Abnormal   Collection Time    12/29/12  8:00 PM      Result Value Range   Prothrombin Time 18.0 (*) 11.6 - 15.2 seconds   INR 1.54 (*) 0.00 - 1.49   aPTT 42 (*) 24 - 37 seconds   Comment:            IF BASELINE aPTT IS ELEVATED,     SUGGEST PATIENT RISK ASSESSMENT     BE USED TO DETERMINE APPROPRIATE     ANTICOAGULANT THERAPY.   Fibrinogen 383  204 - 475 mg/dL   D-Dimer, Quant 1.61 (*) 0.00 - 0.48 ug/mL-FEU   Comment:            AT THE INHOUSE ESTABLISHED CUTOFF     VALUE OF 0.48 ug/mL FEU,     THIS ASSAY HAS BEEN DOCUMENTED     IN THE LITERATURE TO HAVE     A SENSITIVITY AND NEGATIVE     PREDICTIVE VALUE OF AT LEAST     98 TO 99%.  THE TEST RESULT     SHOULD BE CORRELATED WITH     AN ASSESSMENT OF THE CLINICAL     PROBABILITY OF DVT / VTE.   Platelets 102 (*) 150 - 400 K/uL   Comment: CONSISTENT WITH PREVIOUS RESULT   Smear Review NO SCHISTOCYTES SEEN    MRSA PCR SCREENING     Status: Abnormal   Collection Time    12/29/12  8:18 PM      Result Value Range   MRSA by PCR POSITIVE (*) NEGATIVE   Comment:            The GeneXpert MRSA Assay (FDA     approved for NASAL specimens     only), is one component of a     comprehensive MRSA colonization     surveillance program. It is not     intended to diagnose MRSA     infection nor to guide or     monitor treatment for     MRSA infections.     RESULT CALLED TO, READ BACK BY AND VERIFIED WITHLearta Codding RN 2230 12/29/12 A BROWNING  GLUCOSE, CAPILLARY     Status: Abnormal  Collection Time    12/29/12  8:27 PM      Result Value Range   Glucose-Capillary 162 (*) 70 - 99 mg/dL   Comment 1 Notify RN     Comment 2 Documented in Chart    CARBOXYHEMOGLOBIN     Status: Abnormal    Collection Time    12/29/12  8:30 PM      Result Value Range   Total hemoglobin 12.3 (*) 13.5 - 18.0 g/dL   O2 Saturation 16.1     Carboxyhemoglobin 0.7  0.5 - 1.5 %   Methemoglobin 0.9  0.0 - 1.5 %  CULTURE, RESPIRATORY (NON-EXPECTORATED)     Status: None   Collection Time    12/29/12 11:41 PM      Result Value Range   Specimen Description TRACHEAL ASPIRATE     Special Requests NONE     Gram Stain       Value: RARE WBC PRESENT, PREDOMINANTLY PMN     RARE SQUAMOUS EPITHELIAL CELLS PRESENT     ABUNDANT GRAM POSITIVE COCCI     IN PAIRS   Culture PENDING     Report Status PENDING    GLUCOSE, CAPILLARY     Status: Abnormal   Collection Time    12/29/12 11:50 PM      Result Value Range   Glucose-Capillary 241 (*) 70 - 99 mg/dL   Comment 1 Documented in Chart     Comment 2 Notify RN    TROPONIN I     Status: Abnormal   Collection Time    12/30/12 12:54 AM      Result Value Range   Troponin I 2.91 (*) <0.30 ng/mL   Comment:            Due to the release kinetics of cTnI,     a negative result within the first hours     of the onset of symptoms does not rule out     myocardial infarction with certainty.     If myocardial infarction is still suspected,     repeat the test at appropriate intervals.     CRITICAL VALUE NOTED.  VALUE IS CONSISTENT WITH PREVIOUSLY REPORTED AND CALLED VALUE.  GLUCOSE, CAPILLARY     Status: Abnormal   Collection Time    12/30/12  4:18 AM      Result Value Range   Glucose-Capillary 193 (*) 70 - 99 mg/dL   Comment 1 Notify RN    CBC     Status: Abnormal   Collection Time    12/30/12  5:00 AM      Result Value Range   WBC 8.9  4.0 - 10.5 K/uL   RBC 2.60 (*) 4.22 - 5.81 MIL/uL   Hemoglobin 8.3 (*) 13.0 - 17.0 g/dL   HCT 09.6 (*) 04.5 - 40.9 %   MCV 93.1  78.0 - 100.0 fL   MCH 31.9  26.0 - 34.0 pg   MCHC 34.3  30.0 - 36.0 g/dL   RDW 81.1  91.4 - 78.2 %   Platelets 70 (*) 150 - 400 K/uL   Comment: CONSISTENT WITH PREVIOUS RESULT  BASIC METABOLIC  PANEL     Status: Abnormal   Collection Time    12/30/12  5:00 AM      Result Value Range   Sodium 138  135 - 145 mEq/L   Potassium 3.8  3.5 - 5.1 mEq/L   Chloride 107  96 - 112 mEq/L   CO2 20  19 - 32 mEq/L   Glucose, Bld 189 (*) 70 - 99 mg/dL   BUN 38 (*) 6 - 23 mg/dL   Creatinine, Ser 4.09 (*) 0.50 - 1.35 mg/dL   Calcium 7.4 (*) 8.4 - 10.5 mg/dL   GFR calc non Af Amer 33 (*) >90 mL/min   GFR calc Af Amer 39 (*) >90 mL/min   Comment:            The eGFR has been calculated     using the CKD EPI equation.     This calculation has not been     validated in all clinical     situations.     eGFR's persistently     <90 mL/min signify     possible Chronic Kidney Disease.  LIPID PANEL     Status: Abnormal   Collection Time    12/30/12  5:00 AM      Result Value Range   Cholesterol 80  0 - 200 mg/dL   Triglycerides 85  <811 mg/dL   HDL 26 (*) >91 mg/dL   Total CHOL/HDL Ratio 3.1     VLDL 17  0 - 40 mg/dL   LDL Cholesterol 37  0 - 99 mg/dL   Comment:            Total Cholesterol/HDL:CHD Risk     Coronary Heart Disease Risk Table                         Men   Women      1/2 Average Risk   3.4   3.3      Average Risk       5.0   4.4      2 X Average Risk   9.6   7.1      3 X Average Risk  23.4   11.0                Use the calculated Patient Ratio     above and the CHD Risk Table     to determine the patient's CHD Risk.                ATP III CLASSIFICATION (LDL):      <100     mg/dL   Optimal      478-295  mg/dL   Near or Above                        Optimal      130-159  mg/dL   Borderline      621-308  mg/dL   High      >657     mg/dL   Very High  TROPONIN I     Status: Abnormal   Collection Time    12/30/12  6:32 AM      Result Value Range   Troponin I 2.32 (*) <0.30 ng/mL   Comment:            Due to the release kinetics of cTnI,     a negative result within the first hours     of the onset of symptoms does not rule out     myocardial infarction with  certainty.     If myocardial infarction is still suspected,     repeat the test at appropriate intervals.     CRITICAL VALUE NOTED.  VALUE IS CONSISTENT WITH PREVIOUSLY REPORTED AND CALLED VALUE.  GLUCOSE, CAPILLARY     Status: Abnormal   Collection Time    12/30/12  7:14 AM      Result Value Range   Glucose-Capillary 162 (*) 70 - 99 mg/dL  TROPONIN I     Status: Abnormal   Collection Time    12/30/12  8:05 AM      Result Value Range   Troponin I 2.46 (*) <0.30 ng/mL   Comment:            Due to the release kinetics of cTnI,     a negative result within the first hours     of the onset of symptoms does not rule out     myocardial infarction with certainty.     If myocardial infarction is still suspected,     repeat the test at appropriate intervals.     CRITICAL VALUE NOTED.  VALUE IS CONSISTENT WITH PREVIOUSLY REPORTED AND CALLED VALUE.  LACTIC ACID, PLASMA     Status: None   Collection Time    12/30/12  8:30 AM      Result Value Range   Lactic Acid, Venous 1.5  0.5 - 2.2 mmol/L    Studies/Results: Ct Head Wo Contrast  12/29/2012  *RADIOLOGY REPORT*  Clinical Data: Altered mental status  CT HEAD WITHOUT CONTRAST  Technique:  Contiguous axial images were obtained from the base of the skull through the vertex without contrast.  Comparison: CT 11/16/2012  Findings: Moderate atrophy.  Chronic microvascular ischemic changes in the white matter are stable from prior study.  No acute infarct. Negative for hemorrhage or mass.  No midline shift.  Calvarium is intact.  There is chronic sinusitis, most prominent in the right maxillary sinus.  No acute bony abnormality.  IMPRESSION: Atrophy and chronic microvascular ischemia.  No acute intracranial abnormality.  Chronic sinusitis.   Original Report Authenticated By: Janeece Riggers, M.D.    Dg Chest Port 1 View  12/30/2012  *RADIOLOGY REPORT*  Clinical Data: Follow up pneumonia  PORTABLE CHEST - 1 VIEW  Comparison: 12/29/2012  Findings: Endotracheal  tube remains in good position.  Central venous catheter tip remains in the SVC.  NG tube has been placed since yesterday.  Improvement in superior mediastinal widening.  This may have been due to vascular congestion.  Diffuse bilateral airspace disease shows some improvement and this may be edema.  There are increasing pleural effusions and increasing bibasilar atelectasis.  IMPRESSION: Prominent superior mediastinum shows improvement.  This may be due to improving vascular congestion and edema.  Increasing bilateral pleural effusions and bibasilar atelectasis.   Original Report Authenticated By: Janeece Riggers, M.D.    Dg Chest Portable 1 View  12/29/2012  *RADIOLOGY REPORT*  Clinical Data: Central line placement.  PORTABLE CHEST - 1 VIEW  Comparison: 12/29/2012 1:17 p.m.  Findings: Endotracheal tube tip 2.3 cm above the carina.  Left central line has been placed and the tip is at the level of the distal superior vena cava.  No gross pneumothorax.  Mediastinum appears prominent.  There is may be related to the right upper lobe atelectasis/consolidation.  Primary mediastinal abnormality not excluded.  Pulmonary vascular congestion most notable centrally.  Cardiomegaly.  IMPRESSION: Left central line has been placed and the tip is at the level of the distal superior vena cava.  No gross pneumothorax.  Mediastinum appears prominent.  There is may be related to the right upper lobe atelectasis/consolidation.  Primary mediastinal abnormality not excluded.  Pulmonary vascular congestion most notable  centrally.  Cardiomegaly.  This is a call report.   Original Report Authenticated By: Lacy Duverney, M.D.    Dg Chest Portable 1 View  12/29/2012  *RADIOLOGY REPORT*  Clinical Data: Altered mental status, ETT adjustment  PORTABLE CHEST - 1 VIEW  Comparison: None.  Findings: Endotracheal tube terminates 3 cm above the carina.  Patchy right upper lobe opacity, suspicious for pneumonia.  Patchy bibasilar opacities, likely  atelectasis.  Patchy/nodular opacity in the lateral left mid lung, suspicious for neoplasm on prior CT. No pleural effusion or pneumothorax.  Mild cardiomegaly.  IMPRESSION: Endotracheal tube terminates 3 cm above the carina.  Otherwise unchanged.   Original Report Authenticated By: Charline Bills, M.D.    Dg Chest Portable 1 View  12/29/2012  *RADIOLOGY REPORT*  Clinical Data: Altered mental status, ETT placement  PORTABLE CHEST - 1 VIEW  Comparison: CT chest dated 11/14/2012  Findings: Endotracheal tube terminates 1 cm above the carina.  Mild patchy right upper lobe opacity, suspicious for pneumonia. Patchy bibasilar opacities, possibly atelectasis.  Nodular opacity in the left mid lung, corresponding to possible malignancy on prior CT.  No pleural effusion or pneumothorax.  Mild cardiomegaly.  IMPRESSION: Endotracheal tube terminates 1 cm above the carina.  Consider withdrawal approximately 2 cm.  Mild patchy right upper lobe opacity, suspicious for pneumonia.  Nodular opacity in the left mid lung, corresponding to possible malignancy on prior CT.   Original Report Authenticated By: Charline Bills, M.D.     Medications:  I have reviewed the patient's current medications. Scheduled: . aspirin  325 mg Oral Daily  . aztreonam  1 g Intravenous NOW  . aztreonam  1 g Intravenous Q8H  . chlorhexidine  15 mL Mouth/Throat BID  . cycloSPORINE  1 drop Both Eyes BID  . hydrocortisone sod succinate (SOLU-CORTEF) injection  50 mg Intravenous Q6H  . insulin aspart  2-6 Units Subcutaneous Q4H  . [START ON 12/31/2012] levofloxacin (LEVAQUIN) IV  750 mg Intravenous Q48H  . pantoprazole (PROTONIX) IV  40 mg Intravenous QHS  . vancomycin  750 mg Intravenous Q24H   Continuous: . sodium chloride 125 mL/hr at 12/30/12 0700  . fentaNYL infusion INTRAVENOUS Stopped (12/30/12 0830)  . norepinephrine (LEVOPHED) Adult infusion Stopped (12/29/12 2144)   BJY:NWGNFA chloride, fentaNYL, midazolam, sodium chloride,  sodium chloride  Assessment/Plan: Altered mental status, most likely multifactorial, including pneumonia as well as metabolic encephalopathy. Patient's mental status has markedly improved and appears to be normal at this point. There no clinical indications of acute stroke.  No further neurological intervention is indicated. Recommend canceling MRI of the brain as well as previously recommended stroke workup. We will sign off on his care at this point but will remain available for reevaluation if indicated.  C.R. [name], MD [facility] [phone]  12/30/2012  10:29 AM

## 2012-12-30 NOTE — Progress Notes (Signed)
PULMONARY  / CRITICAL CARE MEDICINE  Name: Guy [name] MRN: [id number] DOB: 1920-08-28    ADMISSION DATE:  12/29/2012 CONSULTATION DATE:  12/29/12  REFERRING MD :  [facility] - Dr. Radford Pax  CHIEF COMPLAINT:  AMS, Acute Respiratory Failure  BRIEF PATIENT DESCRIPTION: 79 y/o M with PMH of HTN, DM, Prostate Cancer s/p prostatectomy with lung metastasis who as of late has been in SNF after kyphoplasty (10/2012) fo rehab efforts presented to [facility] ER on 4/1 with AMS and acute respiratory failure.  Intubated in ER, PCCM called for ICU admit.  Concern for CVA / aspiration.     SIGNIFICANT EVENTS / STUDIES:  4/01 - admit to [facility] with AMS, acute respiratory failure requiring intubation 4/2 - awake, follows commands, no pressors  LINES / TUBES: 4/1 OETT>>> 4/1 L IJ TLC>>>  CULTURES: 4/1 Urine >> 4/1 Sputum >> 4/1 Blood ctx x2 >>>gram neg rods   ANTIBIOTICS:  PCN ALLERGIC Aztreonam 4/1>>> Levaquin 4/1>>> Vanco 4/1>>>4/2  SUBJECTIVE: on vent, but alert, denies any pain  VITAL SIGNS: Temp:  [96.4 F (35.8 C)-98.7 F (37.1 C)] 98.7 F (37.1 C) (04/02 0600) Pulse Rate:  [51-70] 57 (04/02 0600) Resp:  [12-22] 16 (04/02 0600) BP: (68-168)/(35-104) 118/54 mmHg (04/02 0600) SpO2:  [84 %-100 %] 100 % (04/02 0600) FiO2 (%):  [48.7 %-100 %] 50.1 % (04/02 0600) Weight:  [161 lb 13.1 oz (73.4 kg)] 161 lb 13.1 oz (73.4 kg) (04/02 0500) HEMODYNAMICS: CVP:  [12 mmHg-13 mmHg] 13 mmHg VENTILATOR SETTINGS: Vent Mode:  [-] PRVC FiO2 (%):  [48.7 %-100 %] 50.1 % Set Rate:  [14 bmp] 14 bmp Vt Set:  [600 mL] 600 mL PEEP:  [5 cmH20] 5 cmH20 Plateau Pressure:  [17 cmH20-18 cmH20] 18 cmH20 INTAKE / OUTPUT: Intake/Output     04/01 0701 - 04/02 0700   I.V. (mL/kg) 1470.1 (20)   IV Piggyback 400   Total Intake(mL/kg) 1870.1 (25.5)   Urine (mL/kg/hr) 810   Total Output 810   Net +1060.1         PHYSICAL EXAMINATION: General:  Ill appearing, alert on vent, follow commands Neuro:  Follow commands, no focal  neuro deficits HEENT:  ETT Cardiovascular:  s1s2 regular, bradycardic, no murmur Lungs:  Coarse breath sounds b/l, no wheeze Abdomen:  Soft, scar lower abdomen, no organomegaly, decreased bowel sounds Musculoskeletal:  2-3+ pitting edema up to knees bilaterally Skin:  warm  LABS:  Recent Labs Lab 12/29/12 1431 12/29/12 1455 12/29/12 1700 12/29/12 1836 12/29/12 2000 12/30/12 0054 12/30/12 0500  HGB 8.4*  --  9.0*  --   --   --  8.3*  WBC 12.0*  --  13.3*  --   --   --  8.9  PLT 71*  --  77*  --  102*  --  70*  NA 140  --  139  --   --   --  138  K 3.1*  --  3.6  --   --   --  3.8  CL 108  --  108  --   --   --  107  CO2 21  --  21  --   --   --  20  GLUCOSE 94  --  131*  --   --   --  189*  BUN 31*  --  31*  --   --   --  38*  CREATININE 1.78*  --  1.80*  --   --   --  1.71*  CALCIUM 7.5*  --  7.6*  --   --   --  7.4*  AST 38*  --  44*  --   --   --   --   ALT 16  --  19  --   --   --   --   ALKPHOS 104  --  116  --   --   --   --   BILITOT 0.7  --  0.6  --   --   --   --   PROT 4.8*  --  5.2*  --   --   --   --   ALBUMIN 1.7*  --  1.8*  --   --   --   --   APTT  --   --  37  --  42*  --   --   INR 1.66*  --  1.51*  --  1.54*  --   --   LATICACIDVEN  --  3.22* 3.0*  --   --   --   --   TROPONINI  --   --  2.43*  --   --  2.91*  --   PROCALCITON  --   --  50.92  --   --   --   --   PHART  --   --   --  7.373  --   --   --   PCO2ART  --   --   --  35.0  --   --   --   PO2ART  --   --   --  332.0*  --   --   --     Recent Labs Lab 12/29/12 1857 12/29/12 2027 12/29/12 2350 12/30/12 0418  GLUCAP 188* 162* 241* 193*    Imaging: 12/30/12: central edema improving. Right upper lobe infiltrate  ASSESSMENT / PLAN:  PULMONARY A: Acute respiratory failure 2nd to altered mental status and pneumonia, likely aspiration, r/o edema (ischemia) P:   Try SBT, cpap 5 ps 5 goals 2 hrs Would need to discuss reintubation status pcxr improved See cvs May need  lasix  CARDIOVASCULAR A: Shock likely from sepsis with pneumonia. Hx of HTN, CAD. R/o ischemia P:  -Off pressor since 10pm -Tro 2.9--on ASA -hold outpt lasix, cozaar, lopressor, zocor -check 2 D echo -Consider card consult pending family discussions -when bale, add beta blocker, (was brady slight), holding  RENAL A:  CKD (baseline 1.1-1.3). Cr 1.7. Urine output 810cc Hypokalemia-resolved Lactic acidosis 3.2-->3 P:   -Check BMP in AM -Repeat lactic acid today -may need lasix -no pressors, limit gross overload  GASTROINTESTINAL A:  Nutrition. Hx of GERD.  P:   -Tube feeds if unable to wean from vent soon Protonix for SUP -SPeech/swallow eval once weaned-  HEMATOLOGIC A:  Anemia of chronic disease. Baseline Hb 9-11,  Hb 8.3 today Thrombocytopenia. Plt 70K Hx of metastatic prostate cancer. P:  F/u DIC panel. D dimer elevated 8.67, fibrinogen 383 Hold heparin for now SCD for DVT prevention  INFECTIOUS A:  HCAP vs UTI vs bacteremia. Blood ctx x 2 positive for gram neg rods Hx of PCN allergy. P:   Levaquin, azactam continue ( has made clinical progres on this) Renal US now, r/o hydro -Consider d/c vanco If declines change to [facility] -Narrow abx once sensitivities are available  ENDOCRINE A:  Diabetes type II. Relative adrenal insufficiency 2nd to chronic prednisone use. P:   SSI Solucortef 50 mg  q6h Hold outpt amaryl, lantus  NEUROLOGIC A:  Acute encephalopathy with concern for acute stroke. Hx of depression, chronic back pain. P:   Neurology recs dc all cva work up ASA for +tro  TSH 1.66 Hold outpt neurontin, zoloft  GOALS of CARE  A: Limited resuscitation. P: No CPR, no defibrillation, will discuss reintubation status  Today's summary: improving , tolerating weaning.  Will get MRI and speech/swallow eval. Continue abx for possible UTI/PNA/bacteremia  Carrolyn Meiers, PGY-3 12/30/2012, 6:47 AM  I have fully examined this patient and agree with above  findings.    And edited i nfull ' Ccm time 30 min   Mcarthur Rossetti. Tyson Alias, MD, FACP Pgr: [phone] [facility]

## 2012-12-30 NOTE — Progress Notes (Signed)
SLP Cancellation Note  Patient Details Name: Guy [name] MRN: [id number] DOB: 1920-01-03   Cancelled treatment:       Reason Eval/Treat Not Completed: Patient not medically ready. Received orders for SLP eval and treat via stroke protocol. Pt orally intubated, not appropriate at this time. SLP will sign off, please reorder when ready. Thanks [place], [place] CCC-SLP [phone]  Claudine Mouton 12/30/2012, 9:16 AM

## 2012-12-30 NOTE — Progress Notes (Signed)
CRITICAL VALUE ALERT  Critical value received:  Gram Neg Rods in Anaerobic and Aerobic BC Bottle  Date of notification:  12/30/12  Time of notification:  1230, 1425    Critical value read back:yes  Nurse who received alert:  Alto Denver, RN  MD notified (1st page):  Dr. Anselm Jungling (in person)  Time of first page:  1430  MD notified (2nd page):  Time of second page:  Responding MD:  Dr. Anselm Jungling  Time MD responded:  1430

## 2012-12-31 DIAGNOSIS — I517 Cardiomegaly: Secondary | ICD-10-CM

## 2012-12-31 LAB — BASIC METABOLIC PANEL
BUN: 42 mg/dL — ABNORMAL HIGH (ref 6–23)
Calcium: 7.5 mg/dL — ABNORMAL LOW (ref 8.4–10.5)
Creatinine, Ser: 1.56 mg/dL — ABNORMAL HIGH (ref 0.50–1.35)
GFR calc Af Amer: 43 mL/min — ABNORMAL LOW (ref >90)
GFR calc non Af Amer: 37 mL/min — ABNORMAL LOW (ref >90)
Glucose, Bld: 143 mg/dL — ABNORMAL HIGH (ref 70–99)

## 2012-12-31 LAB — CBC WITH DIFFERENTIAL/PLATELET
Basophils Absolute: 0 10*3/uL (ref 0.0–0.1)
Basophils Relative: 0 % (ref 0–1)
Eosinophils Absolute: 0 10*3/uL (ref 0.0–0.7)
MCH: 31.9 pg (ref 26.0–34.0)
MCHC: 34.3 g/dL (ref 30.0–36.0)
Neutro Abs: 9.5 10*3/uL — ABNORMAL HIGH (ref 1.7–7.7)
Neutrophils Relative %: 89 % — ABNORMAL HIGH (ref 43–77)
RDW: 14.4 % (ref 11.5–15.5)

## 2012-12-31 LAB — URINE CULTURE

## 2012-12-31 LAB — GLUCOSE, CAPILLARY
Glucose-Capillary: 120 mg/dL — ABNORMAL HIGH (ref 70–99)
Glucose-Capillary: 129 mg/dL — ABNORMAL HIGH (ref 70–99)
Glucose-Capillary: 138 mg/dL — ABNORMAL HIGH (ref 70–99)
Glucose-Capillary: 170 mg/dL — ABNORMAL HIGH (ref 70–99)
Glucose-Capillary: 175 mg/dL — ABNORMAL HIGH (ref 70–99)
Glucose-Capillary: 207 mg/dL — ABNORMAL HIGH (ref 70–99)

## 2012-12-31 MED ORDER — PANTOPRAZOLE SODIUM 40 MG PO TBEC
40.0000 mg | DELAYED_RELEASE_TABLET | Freq: Every day | ORAL | Status: DC
  Administered 2012-12-31 – 2013-01-04 (×5): 40 mg via ORAL
  Filled 2012-12-31 (×5): qty 1

## 2012-12-31 MED ORDER — VANCOMYCIN HCL IN DEXTROSE 750-5 MG/150ML-% IV SOLN
750.0000 mg | INTRAVENOUS | Status: DC
  Administered 2012-12-31 – 2013-01-03 (×4): 750 mg via INTRAVENOUS
  Filled 2012-12-31 (×5): qty 150

## 2012-12-31 MED ORDER — METOPROLOL TARTRATE 12.5 MG HALF TABLET
12.5000 mg | ORAL_TABLET | Freq: Two times a day (BID) | ORAL | Status: DC
  Administered 2012-12-31 – 2013-01-04 (×9): 12.5 mg via ORAL
  Filled 2012-12-31 (×12): qty 1

## 2012-12-31 MED ORDER — FUROSEMIDE 20 MG PO TABS
20.0000 mg | ORAL_TABLET | Freq: Every day | ORAL | Status: DC
  Filled 2012-12-31 (×2): qty 1

## 2012-12-31 MED ORDER — INSULIN ASPART 100 UNIT/ML ~~LOC~~ SOLN
0.0000 [IU] | Freq: Three times a day (TID) | SUBCUTANEOUS | Status: DC
Start: 2012-12-31 — End: 2013-01-04
  Administered 2012-12-31: 2 [IU] via SUBCUTANEOUS
  Administered 2013-01-01: 3 [IU] via SUBCUTANEOUS
  Administered 2013-01-01: 5 [IU] via SUBCUTANEOUS
  Administered 2013-01-01: 1 [IU] via SUBCUTANEOUS
  Administered 2013-01-02 (×3): 3 [IU] via SUBCUTANEOUS
  Administered 2013-01-03 (×2): 2 [IU] via SUBCUTANEOUS
  Administered 2013-01-03 – 2013-01-04 (×2): 5 [IU] via SUBCUTANEOUS
  Administered 2013-01-04: 3 [IU] via SUBCUTANEOUS
  Administered 2013-01-04: 5 [IU] via SUBCUTANEOUS

## 2012-12-31 MED ORDER — PERFLUTREN LIPID MICROSPHERE
1.0000 mL | INTRAVENOUS | Status: AC | PRN
  Administered 2012-12-31: 2 mL via INTRAVENOUS
  Filled 2012-12-31: qty 10

## 2012-12-31 MED ORDER — GLUCERNA SHAKE PO LIQD
237.0000 mL | Freq: Three times a day (TID) | ORAL | Status: DC
  Administered 2012-12-31 – 2013-01-04 (×8): 237 mL via ORAL

## 2012-12-31 NOTE — Progress Notes (Signed)
INITIAL NUTRITION ASSESSMENT  DOCUMENTATION CODES Per approved criteria  -Not Applicable   INTERVENTION:  Glucerna Shake po TID, each supplement provides 220 kcal and 10 grams of protein.  NUTRITION DIAGNOSIS: Inadequate oral intake related to poor appetite as evidenced by poor intake of full liquid meals.   Goal: Intake to meet >90% of estimated nutrition needs.  Monitor:  PO intake, labs, weight trend.  Reason for Assessment: MST=2  79 y.o. male  Admitting Dx: AMS, acute respiratory failure  ASSESSMENT: Paitient admitted with AMS, required intubation in the ER.  Patient has a history of prostate cancer with lung metastasis.  CT of head on admission was negative for CVA.  Chest xray consistent with PNA.  Per discussion with patient, he feels like he has gained weight recently due to fluids.  He states that he used to be 5\' 10" , but now is 5\' 6" . Patient reports that he is "allergic" to milk, does not like yogurt, but does eat cheese.  Tries to stay away from foods containing sugar.  Agreed to try Glucerna to supplement PO intake.  Consult to SLP for swallow evaluation is planned for today.  Patient is at nutrition risk, given mild muscle depletion and current poor PO intake.  Nutrition Focused Physical Exam:  Subcutaneous Fat:  Orbital Region: WNL Upper Arm Region: WNL Thoracic and Lumbar Region: NA  Muscle:  Temple Region: Mild-moderate depletion Clavicle Bone Region: WNL Clavicle and Acromion Bone Region: WNL Scapular Bone Region: NA Dorsal Hand: Mild-moderate depletion Patellar Region: NA Anterior Thigh Region: NA Posterior Calf Region: NA  Edema: present  Height: Ht Readings from Last 1 Encounters:  12/31/12 5\' 6"  (1.676 m)    Weight: Wt Readings from Last 1 Encounters:  12/31/12 161 lb 9.6 oz (73.3 kg)    Ideal Body Weight: 64.5 kg  % Ideal Body Weight: 114% (up with edema)  Wt Readings from Last 10 Encounters:  12/31/12 161 lb 9.6 oz (73.3  kg)  12/28/12 155 lb (70.308 kg)  11/22/12 145 lb (65.772 kg)  08/01/11 162 lb 8.7 oz (73.73 kg)    Usual Body Weight: 146 lb per patient  % Usual Body Weight: 110%  BMI: 23.6 using usual weight of 146 lb  Estimated Nutritional Needs: Kcal: 1550-1650 Protein: 80-90 gm Fluid: 1.6-1.7 L  Skin: abrasion on left lower leg  Diet Order: Full Liquid  EDUCATION NEEDS: -Education not appropriate at this time   Intake/Output Summary (Last 24 hours) at 12/31/12 1021 Last data filed at 12/31/12 0600  Gross per 24 hour  Intake    925 ml  Output    845 ml  Net     80 ml    Last BM: 4/1   Labs:   Recent Labs Lab 12/29/12 1700 12/30/12 0500 12/31/12 0500  NA 139 138 140  K 3.6 3.8 3.5  CL 108 107 110  CO2 21 20 19   BUN 31* 38* 42*  CREATININE 1.80* 1.71* 1.56*  CALCIUM 7.6* 7.4* 7.5*  GLUCOSE 131* 189* 143*    CBG (last 3)   Recent Labs  12/30/12 2342 12/31/12 0430 12/31/12 0758  GLUCAP 120* 138* 129*    Scheduled Meds: . antiseptic oral rinse  15 mL Mouth Rinse QID  . aspirin  325 mg Oral Daily  . aztreonam  1 g Intravenous Q8H  . chlorhexidine  15 mL Mouth/Throat BID  . Chlorhexidine Gluconate Cloth  6 each Topical Q0600  . cycloSPORINE  1 drop Both Eyes BID  .  furosemide  20 mg Oral Daily  . hydrocortisone sod succinate (SOLU-CORTEF) injection  50 mg Intravenous Q6H  . insulin aspart  2-6 Units Subcutaneous Q4H  . levofloxacin (LEVAQUIN) IV  750 mg Intravenous Q48H  . metoprolol tartrate  12.5 mg Oral BID  . mupirocin ointment  1 application Nasal BID  . pantoprazole (PROTONIX) IV  40 mg Intravenous QHS    Continuous Infusions: . norepinephrine (LEVOPHED) Adult infusion Stopped (12/29/12 2144)    Past Medical History  Diagnosis Date  . Hypertension   . Diabetes mellitus without complication   . Arthritis   . Sciatica   . GERD (gastroesophageal reflux disease)   . Bladder cancer   . Urge incontinence   . Prostate cancer     S/P  prostatectomy; Lung metastasis  . Vertigo   . Hyperlipidemia   . ED (erectile dysfunction)   . Gastric polyposis   . Osteoporosis   . Decreased libido   . Depression   . Hepatitis A   . CAD (coronary artery disease)   . Stroke   . History of blood transfusion     1946  . OA (osteoarthritis)     Past Surgical History  Procedure Laterality Date  . Coronary angioplasty with stent placement  1996  . Carpal tunnel release    . Cataract extraction w/ intraocular lens  implant, bilateral  1998  . Prostatectomy    . Urinary sphincter implant    . Urinary sphincter implant revision    . Appendectomy    . Left hip surgery      Donated bone for bone graft to arm  . Penile prosthesis implant      S/P removal and re-implantation of new prosthesis  . Middle ear surgery    . Finger surgery      Left and right  . Hernia repair    . Bladder surgery    . Right arm bone graft      Pathological fracture    Joaquin Courts, RD, LDN, CNSC Pager [phone] After Hours Pager [phone]

## 2012-12-31 NOTE — Progress Notes (Signed)
ANTIBIOTIC CONSULT NOTE - INITIAL  Pharmacy Consult for Vancomycin Indication: supsected pneumonia  Allergies  Allergen Reactions  . Albuterol Sulfate Hfa (ZOX:[id number]) Shortness Of Breath  . Adhesive (Tape) Other (See Comments)    REACTION: SKIN BLISTERS  . Penicillins Other (See Comments)    REACTION: ITCHING HANDS  . Sulfa Drugs Cross Reactors Hives    Patient Measurements: Height: 5\' 5"  (165.1 cm) Weight: 161 lb 13.1 oz (73.4 kg) IBW/kg (Calculated) : 61.5  Vital Signs: Temp: 98.4 F (36.9 C) (04/03 1821) Temp src: Oral (04/03 1821) BP: 173/90 mmHg (04/03 1821) Pulse Rate: 70 (04/03 1821)   Recent Labs  12/29/12 1700 12/29/12 2000 12/30/12 0500 12/31/12 0500  WBC 13.3*  --  8.9 10.7*  HGB 9.0*  --  8.3* 9.1*  PLT 77* 102* 70* 79*  CREATININE 1.80*  --  1.71* 1.56*   Estimated Creatinine Clearance: 27.4 ml/min (by C-G formula based on Cr of 1.56).  Medical History: Past Medical History  Diagnosis Date  . Hypertension   . Diabetes mellitus without complication   . Arthritis   . Sciatica   . GERD (gastroesophageal reflux disease)   . Bladder cancer   . Urge incontinence   . Prostate cancer     S/P prostatectomy; Lung metastasis  . Vertigo   . Hyperlipidemia   . ED (erectile dysfunction)   . Gastric polyposis   . Osteoporosis   . Decreased libido   . Depression   . Hepatitis A   . CAD (coronary artery disease)   . Stroke   . History of blood transfusion     1946  . OA (osteoarthritis)    Assessment: 75yoM admitted with AMS and acute respiratory failure being restarted on Vancomycin for possible pneumonia. Also currently on Levaquin and aztreonam. WBC 10.7, Tmax 99'F, CrCl ~27 ml/min.  Antibiotics Aztreonam 4/2 >> Levaquin 4/1 >> Vancomycin 4/1-4/1,  4/3 >>  Cultures Urine (4/1): Klebsiella Pneumoniae - Levaquin sensitive  Tracheal Aspirate (4/1): Staph Aureus Blood x2 (4/1): Gram negative rods MRSA PCR screen (+)  Goal of Therapy:   Vancomycin trough level 15-20 mcg/ml Clinical improvement  Plan:  1) Restart Vancomycin 750mg  IV Q24 hours.  2) F/u renal function, clinical status, cultures, LOT   Benjaman Pott, PharmD, BCPS 12/31/2012   7:11 PM

## 2012-12-31 NOTE — Evaluation (Signed)
Physical Therapy Evaluation Patient Details Name: Guy [name] MRN: [id number] DOB: 04-11-1920 Today's Date: 12/31/2012 Time: 6962-9528 PT Time Calculation (min): 30 min  PT Assessment / Plan / Recommendation Clinical Impression  Pt s/p AMS, encephalopathy, septic shock, with VDRF with decr mobility secondary to decr endurance and decr balance.  Will benefit from PT to address mobility issues.  Needs NHP.            Follow Up Recommendations  Supervision/Assistance - 24 hour;SNF       Barriers to Discharge Decreased caregiver support      Equipment Recommendations  None recommended by PT         Frequency Min 3X/week    Precautions / Restrictions Precautions Precautions: Fall Restrictions Weight Bearing Restrictions: No   Pertinent Vitals/Pain See doc flowsheet      Mobility  Bed Mobility Bed Mobility: Rolling Left;Left Sidelying to Sit;Sitting - Scoot to Edge of Bed Rolling Left: 1: +2 Total assist Rolling Left: Patient Percentage: 40% Left Sidelying to Sit: 1: +2 Total assist Left Sidelying to Sit: Patient Percentage: 60% Sitting - Scoot to Edge of Bed: 3: Mod assist Details for Bed Mobility Assistance: Pt used PT to pull up to sitting.  Needed assist and cues for technique.  Mod assisst to scoot EOB. Transfers Transfers: Sit to Stand;Stand to Sit;Stand Pivot Transfers Sit to Stand: 1: +2 Total assist;With upper extremity assist;From bed Sit to Stand: Patient Percentage: 30% Stand to Sit: 1: +2 Total assist;With upper extremity assist;To chair/3-in-1;With armrests Stand to Sit: Patient Percentage: 30% Stand Pivot Transfers: 1: +2 Total assist Stand Pivot Transfers: Patient Percentage: 40% Details for Transfer Assistance: Pt with difficulty following cues to use hands on bed and push from bed needing extra assist to achieve standing position.  Once standing,pt very unsteady with significant posterior lean and wide BOS.  Needed assist to weight shift to pivot to  chair with pt using UEs and pulling on therapists to be able to weight shift.  Pt took incr time to pivot around to chair.   Ambulation/Gait Ambulation/Gait Assistance: Not tested (comment) Stairs: No Wheelchair Mobility Wheelchair Mobility: No    Exercises General Exercises - Lower Extremity Long Arc Quad: AROM;Both;10 reps;Seated Hip Flexion/Marching: AROM;Both;10 reps;Seated   PT Diagnosis: Generalized weakness  PT Problem List: Decreased activity tolerance;Decreased balance;Decreased mobility;Decreased knowledge of use of DME;Decreased safety awareness;Decreased knowledge of precautions PT Treatment Interventions: DME instruction;Gait training;Functional mobility training;Therapeutic activities;Therapeutic exercise;Balance training;Patient/family education;Wheelchair mobility training   PT Goals Acute Rehab PT Goals PT Goal Formulation: With patient Time For Goal Achievement: 01/14/13 Potential to Achieve Goals: Good Pt will go Supine/Side to Sit: with modified independence;with rail PT Goal: Supine/Side to Sit - Progress: Goal set today Pt will go Sit to Stand: with supervision;with upper extremity assist PT Goal: Sit to Stand - Progress: Goal set today Pt will Transfer Bed to Chair/Chair to Bed: with min assist PT Transfer Goal: Bed to Chair/Chair to Bed - Progress: Goal set today Pt will Ambulate: 16 - 50 feet;with min assist;with least restrictive assistive device PT Goal: Ambulate - Progress: Goal set today Pt will Perform Home Exercise Program: with supervision, verbal cues required/provided PT Goal: Perform Home Exercise Program - Progress: Goal set today Pt will Propel Wheelchair: 51 - 150 feet;with modified independence PT Goal: Propel Wheelchair - Progress: Goal set today  Visit Information  Last PT Received On: 12/31/12 Assistance Needed: +2    Subjective Data  Subjective: "I am going to fall." Patient Stated  Goal: To get therapy at the NH   Prior Functioning   Home Living Lives With: Other (Comment) (Lived with wife (w/ dementia) prior to recentNH for therapy ) Available Help at Discharge: Skilled Nursing Facility;Available 24 hours/day Type of Home: Skilled Nursing Facility Home Adaptive Equipment: Dan Humphreys - four wheeled;Shower chair with back;Bedside commode/3-in-1 Prior Function Level of Independence: Needs assistance Needs Assistance: Bathing;Dressing;Feeding;Grooming;Toileting;Meal Prep;Light Housekeeping;Gait;Transfers Bath: Total Dressing: Total Feeding: Moderate Grooming: Moderate Toileting: Maximal Meal Prep: Total Light Housekeeping: Total Gait Assistance: Ambulated with PT in nursing home PTA with RW. Transfer Assistance: min assist to wheeelchair with nursing per pt. Able to Take Stairs?: No Driving: No Vocation: Retired Musician: No difficulties    Copywriter, advertising Overall Cognitive Status: Appears within functional limits for tasks assessed/performed Arousal/Alertness: Awake/alert Orientation Level: Appears intact for tasks assessed Behavior During Session: [facility] for tasks performed    Extremity/Trunk Assessment Right Lower Extremity Assessment RLE ROM/Strength/Tone: [facility] for tasks assessed Left Lower Extremity Assessment LLE ROM/Strength/Tone: [facility] for tasks assessed   Balance Static Sitting Balance Static Sitting - Balance Support: Bilateral upper extremity supported;Feet supported Static Sitting - Level of Assistance: 4: Min assist Static Sitting - Comment/# of Minutes: posterior lean noted needing occasional steadying assist at EOB 5 minutes Static Standing Balance Static Standing - Balance Support: Bilateral upper extremity supported;During functional activity Static Standing - Level of Assistance: 1: +2 Total assist;Patient percentage (comment) (pt =40) Static Standing - Comment/# of Minutes: Pt has posterior lean needing constant steadying assist for static stance.  Stood 2 minutes.    End of  Session PT - End of Session Equipment Utilized During Treatment: Gait belt;Oxygen Activity Tolerance: Patient limited by fatigue Patient left: in chair;with call bell/phone within reach Nurse Communication: Mobility status       INGOLD,Debbra Digiulio 12/31/2012, 11:15 AM [facility] Acute Rehabilitation [phone] (pager)

## 2012-12-31 NOTE — Evaluation (Signed)
Clinical/Bedside Swallow Evaluation Patient Details  Name: Guy [name] MRN: [id number] Date of Birth: [birth year]  Today's Date: 12/31/2012 Time: 1215-1230 SLP Time Calculation (min): 15 min  Past Medical History:  Past Medical History  Diagnosis Date  . Hypertension   . Diabetes mellitus without complication   . Arthritis   . Sciatica   . GERD (gastroesophageal reflux disease)   . Bladder cancer   . Urge incontinence   . Prostate cancer     S/P prostatectomy; Lung metastasis  . Vertigo   . Hyperlipidemia   . ED (erectile dysfunction)   . Gastric polyposis   . Osteoporosis   . Decreased libido   . Depression   . Hepatitis A   . CAD (coronary artery disease)   . Stroke   . History of blood transfusion     1946  . OA (osteoarthritis)    Past Surgical History:  Past Surgical History  Procedure Laterality Date  . Coronary angioplasty with stent placement  1996  . Carpal tunnel release    . Cataract extraction w/ intraocular lens  implant, bilateral  1998  . Prostatectomy    . Urinary sphincter implant    . Urinary sphincter implant revision    . Appendectomy    . Left hip surgery      Donated bone for bone graft to arm  . Penile prosthesis implant      S/P removal and re-implantation of new prosthesis  . Middle ear surgery    . Finger surgery      Left and right  . Hernia repair    . Bladder surgery    . Right arm bone graft      Pathological fracture   HPI:  79 y/o M with PMH of HTN, DM, Prostate Cancer s/p prostatectomy with lung metastasis who as of late has been in SNF after kyphoplasty (10/2012) fo rehab efforts presented to [facility] ER on 4/1 with AMS and acute respiratory failure.  Intubated in ER, PCCM called for ICU admit.  Initial concern for CVA / aspiration. New neuro event ruled-out.  Pt's MS back to baseline.       Assessment / Plan / Recommendation Clinical Impression  Pt presents with functional oropharyngeal swallow marked by active mastication,  swift swallow trigger, and appropriate expiratory response post-swallow.  No signs of aspiration.  Recommend advancing diet as tolerated (pt currently on full liquids.) No SLP f/u warranted.       Diet Recommendation Regular;Thin liquid   Medication Administration: Whole meds with liquid    Other  Recommendations     Follow Up Recommendations  None       Pertinent Vitals/Pain none    SLP Swallow Goals     Swallow Study Prior Functional Status  Type of Home: Skilled Nursing Facility Lives With: Other (Comment) (Lived with wife (w/ dementia) prior to recentNH for therapy ) Available Help at Discharge: Skilled Nursing Facility;Available 24 hours/day Vocation: Retired    General Date of Onset: 12/29/12 HPI: 79 y/o M with PMH of HTN, DM, Prostate Cancer s/p prostatectomy with lung metastasis who as of late has been in SNF after kyphoplasty (10/2012) fo rehab efforts presented to [facility] ER on 4/1 with AMS and acute respiratory failure.  Intubated in ER, PCCM called for ICU admit.  Initial concern for CVA / aspiration. New neuro event ruled-out.  Pt's MS back to baseline.     Type of Study: Bedside swallow evaluation Diet Prior to this Study:  Other (Comment) (liquids) Temperature Spikes Noted: No Respiratory Status: Supplemental O2 delivered via (comment) History of Recent Intubation: Yes Length of Intubations (days): 1 days Date extubated: 12/30/12 Behavior/Cognition: Alert;Cooperative;Pleasant mood Oral Cavity - Dentition: Missing dentition Self-Feeding Abilities: Able to feed self Patient Positioning: Upright in chair Volitional Cough: Strong Volitional Swallow: Able to elicit    Oral/Motor/Sensory Function Overall Oral Motor/Sensory Function: Appears within functional limits for tasks assessed   Ice Chips Ice chips: Within functional limits   Thin Liquid Thin Liquid: Within functional limits Presentation: Cup;Straw    Nectar Thick Nectar Thick Liquid: Not tested   Honey Thick  Honey Thick Liquid: Not tested   Puree Puree: Within functional limits Presentation: Self Fed;Spoon   Solid   Kairav Russomanno L. [place], [place] CCC/SLP Pager [phone]     Solid: Not tested       Blenda Mounts Laurice 12/31/2012,1:00 PM

## 2012-12-31 NOTE — Clinical Social Work Psychosocial (Signed)
     Clinical Social Work Department BRIEF PSYCHOSOCIAL ASSESSMENT 12/31/2012  Patient:  EGON, DITTUS     Account Number:  [id number]     Admit date:  12/29/2012  Clinical Social Worker:  Margaree Mackintosh  Date/Time:  12/31/2012 10:48 AM  Referred by:  Physician  Date Referred:  12/31/2012 Referred for  SNF Placement   Other Referral:   Pt admitted from [facility].   Interview type:  Patient Other interview type:    PSYCHOSOCIAL DATA Living Status:  FACILITY Admitted from facility:  [facility] Level of care:  Skilled Nursing Facility Primary support name:  Karsten Howry: [phone] Primary support relationship to patient:  SPOUSE Degree of support available:   Unknown.    CURRENT CONCERNS Current Concerns  Post-Acute Placement   Other Concerns:    SOCIAL WORK ASSESSMENT / PLAN Clinical Social Worker recieved referral indicating pt is from [facility].  CSW reviewed chart and met wtih pt at bedside.  CSW introduced self, explained role, and provided support.  CSW provided active listening.  Pt stated, "I'm doing better".  Pt agreeable to return to [employer] at [employer]. CSW to submit information to SNF.  CSW to continue to follow and assist as needed.   Assessment/plan status:  Information/Referral to [facility] Other assessment/ plan:   Information/referral to community resources:   SNF.    PATIENTS/FAMILYS RESPONSE TO PLAN OF CARE: Pt was pleasant and engaged in conversation. Pt thanked CSW for intervention.

## 2012-12-31 NOTE — Progress Notes (Signed)
Micro reviewed   Tracheal aspirate with abundant staph aureus; screen pos for MRSA.    CXR with bilateral infiltrates concerning for infection vs edema.    Initiate vanc. Further changes according to final culture and sensitivity.

## 2012-12-31 NOTE — Progress Notes (Signed)
Echocardiogram 2D Echocardiogram with Definity has been performed.  Guy [name] 12/31/2012, 9:41 AM

## 2012-12-31 NOTE — Progress Notes (Addendum)
PULMONARY  / CRITICAL CARE MEDICINE  Name: Guy [name] MRN: [id number] DOB: [birth year]    ADMISSION DATE:  12/29/2012 CONSULTATION DATE:  12/29/12  REFERRING MD :  EDP - Dr. Radford Pax  CHIEF COMPLAINT:  AMS, Acute Respiratory Failure  BRIEF PATIENT DESCRIPTION: 79 y/o M with PMH of HTN, DM, Prostate Cancer s/p prostatectomy with lung metastasis who as of late has been in SNF after kyphoplasty (10/2012) fo rehab efforts presented to [facility] ER on 4/1 with AMS and acute respiratory failure.  Intubated in ER, PCCM called for ICU admit.  Concern for CVA / aspiration.     SIGNIFICANT EVENTS / STUDIES:  4/01 - admit to [facility] with AMS, acute respiratory failure requiring intubation 4/2 - awake, follows commands, no pressors  LINES / TUBES: 4/1 OETT>>>12/30/12 4/1 L IJ TLC>>>  CULTURES: 4/1 Urine >>gram neg rods 4/1 Sputum >> 4/1 Blood ctx x2 >>>gram neg rods >>>  ANTIBIOTICS:  PCN ALLERGIC Aztreonam 4/1>>> Levaquin 4/1>>> Vanco 4/1>>>4/2  SUBJECTIVE: denies any pain.  He states that his breathing is rough. On 2L , satting well 98-100%. Passed swallow eval  VITAL SIGNS: Temp:  [98.1 F (36.7 C)-99.3 F (37.4 C)] 98.1 F (36.7 C) (04/03 0600) Pulse Rate:  [55-72] 60 (04/03 0600) Resp:  [6-36] 23 (04/03 0600) BP: (114-173)/(54-83) 169/71 mmHg (04/03 0600) SpO2:  [98 %-100 %] 99 % (04/03 0600) FiO2 (%):  [39.9 %-49.6 %] 39.9 % (04/02 1400) Weight:  [161 lb 9.6 oz (73.3 kg)] 161 lb 9.6 oz (73.3 kg) (04/03 0500) HEMODYNAMICS: CVP:  [4 mmHg-24 mmHg] 9 mmHg INTAKE / OUTPUT: Intake/Output     04/02 0701 - 04/03 0700   I.V. (mL/kg) 1148.8 (15.7)   NG/GT 120   IV Piggyback 150   Total Intake(mL/kg) 1418.8 (19.4)   Urine (mL/kg/hr) 955 (0.5)   Total Output 955   Net +463.8         PHYSICAL EXAMINATION: General: chronicallly ill appearing but improving, follow commands Neuro:  Follow commands, no focal neuro deficits HEENT:  extubated Cardiovascular:  s1s2 regular, bradycardic, no  murmur Lungs:  Good air entry bilaterally, no wheeze, no crackles Abdomen:  Soft, scar lower abdomen, no organomegaly, decreased bowel sounds Musculoskeletal:  2-3+ pitting edema up to mid-knees bilaterally Skin:  warm  LABS:  Recent Labs Lab 12/29/12 1431 12/29/12 1455 12/29/12 1700 12/29/12 1836 12/29/12 2000  12/30/12 0500 12/30/12 0632 12/30/12 0805 12/30/12 0830 12/30/12 1214 12/31/12 0500  HGB 8.4*  --  9.0*  --   --   --  8.3*  --   --   --   --  9.1*  WBC 12.0*  --  13.3*  --   --   --  8.9  --   --   --   --  10.7*  PLT 71*  --  77*  --  102*  --  70*  --   --   --   --  79*  NA 140  --  139  --   --   --  138  --   --   --   --   --   K 3.1*  --  3.6  --   --   --  3.8  --   --   --   --   --   CL 108  --  108  --   --   --  107  --   --   --   --   --  CO2 21  --  21  --   --   --  20  --   --   --   --   --   GLUCOSE 94  --  131*  --   --   --  189*  --   --   --   --   --   BUN 31*  --  31*  --   --   --  38*  --   --   --   --   --   CREATININE 1.78*  --  1.80*  --   --   --  1.71*  --   --   --   --   --   CALCIUM 7.5*  --  7.6*  --   --   --  7.4*  --   --   --   --   --   AST 38*  --  44*  --   --   --   --   --   --   --   --   --   ALT 16  --  19  --   --   --   --   --   --   --   --   --   ALKPHOS 104  --  116  --   --   --   --   --   --   --   --   --   BILITOT 0.7  --  0.6  --   --   --   --   --   --   --   --   --   PROT 4.8*  --  5.2*  --   --   --   --   --   --   --   --   --   ALBUMIN 1.7*  --  1.8*  --   --   --   --   --   --   --   --   --   APTT  --   --  37  --  42*  --   --   --   --   --   --   --   INR 1.66*  --  1.51*  --  1.54*  --   --   --   --   --   --   --   LATICACIDVEN  --  3.22* 3.0*  --   --   --   --   --   --  1.5  --   --   TROPONINI  --   --  2.43*  --   --   < >  --  2.32* 2.46*  --  1.99*  --   PROCALCITON  --   --  50.92  --   --   --   --   --   --   --   --   --   PHART  --   --   --  7.373  --   --   --   --   --    --   --   --   PCO2ART  --   --   --  35.0  --   --   --   --   --   --   --   --  PO2ART  --   --   --  332.0*  --   --   --   --   --   --   --   --   < > = values in this interval not displayed.  Recent Labs Lab 12/30/12 1104 12/30/12 1550 12/30/12 2023 12/30/12 2342 12/31/12 0430  GLUCAP 160* 118* 116* 120* 138*   Renal US 4/2: no hydronephrosis  ASSESSMENT / PLAN:  PULMONARY A: Acute respiratory failure 2nd to altered mental status and pneumonia, likely aspiration, r/o edema (ischemia). Extubated 12/30/12.  Satting well on 2L [place] P:   Continue supplemental oxygen via [place] Incentive spirometry Continue abx, see ID ambulate  CARDIOVASCULAR A: Shock likely from sepsis with pneumonia. Shock is now resolved Hx of HTN, CAD. Stress ischemia P:   -Tro 2.9--1.99, on ASA -Resume some of home meds: lasix, lopressor, cozaar, zocor -2 D echo pending -resume BB today  RENAL A:  CKD (baseline 1.1-1.3). Cr 1.7-->1.5, improving. Urine output 900 cc Hypokalemia-resolved Lactic acidosis 3.2-->3-->1.5- resolved P:   -Check BMP in AM -hold lasix 24 hr more as atn resolves kvo  GASTROINTESTINAL A:  Nutrition. Hx of GERD.  P:   Protonix for SUP Resume home diet SLP needed  HEMATOLOGIC A:  Anemia of chronic disease. Baseline Hb 9-11,  Hb 9.1 today Leukocytosis is improving from 13.3-->10.7. Thrombocytopenia likely from sepsis. Plt 79K Hx of metastatic prostate cancer. P:  F/u DIC panel. D dimer elevated 8.67, fibrinogen 383 Hold heparin for now SCD for DVT prevention CBC in AM ambulate  INFECTIOUS A:  HCAP vs UTI vs bacteremia. Blood ctx x 2 positive for gram neg rods, pending sensitivities Hx of PCN allergy. P:   Continue Levaquin, azactam Day #3( has made clinical progres on this)- narrow when available Renal US- no hydro -Narrow abx once sensitivities are available -Will call Dr Mcdermit urology, plan in future foley, gets straight cath bid, does he need chtonic  abx?  ENDOCRINE A:  Diabetes type II. Relative adrenal insufficiency 2nd to chronic prednisone use. P:   SSI D/c solucortef 50 mg q6h, as HTn now Hold outpt amaryl, lantus  NEUROLOGIC A:  Acute encephalopathy resolved Hx of depression, chronic back pain. P:   Continue ASA for +tro  Resume home meds once he passes swallow eval outpt neurontin, zoloft Swallow eval  GOALS of CARE  A: Limited resuscitation. P: No CPR, no defibrillation, DNI - full NCB  Today's summary: Continue to improve clinically. Renal US did not show any obstruction/hydronephrosis. Continue abx for gram neg rods bacteremia.  Transfer to floor.  To triad, thanks  Carrolyn Meiers, PGY-3 12/31/2012, 6:22 AM  I have fully examined this patient and agree with above findings.    And edited in full   Mcarthur Rossetti. Tyson Alias, MD, FACP Pgr: [phone] [facility]

## 2012-12-31 NOTE — Progress Notes (Signed)
Chaplain Note:  Chaplain visited with pt who was sitting up in a recliner next to bed.  Pt was awake, alert, oriented, and in good spirits.  Chaplain provided spiritual comfort and support.  Chaplain will follow up as needed.  12/31/12 1000  Clinical Encounter Type  Visited With Patient  Visit Type Spiritual support  Referral From Other (Comment) (Henrine Hayter referral)  Spiritual Encounters  Spiritual Needs Emotional  Stress Factors  Patient Stress Factors Health changes  Family Stress Factors None identified (No family present at this time)  Verdie Shire, Chaplan [phone]

## 2012-12-31 NOTE — Progress Notes (Signed)
Microbiology reviewed.   UTI with Klebsiella pan sensitive;   Currently on Levaquin; cont' further changes per rounding physician.

## 2013-01-01 LAB — CULTURE, RESPIRATORY W GRAM STAIN

## 2013-01-01 LAB — BASIC METABOLIC PANEL WITH GFR
BUN: 37 mg/dL — ABNORMAL HIGH (ref 6–23)
CO2: 22 meq/L (ref 19–32)
Calcium: 7.5 mg/dL — ABNORMAL LOW (ref 8.4–10.5)
Chloride: 108 meq/L (ref 96–112)
Creatinine, Ser: 1.37 mg/dL — ABNORMAL HIGH (ref 0.50–1.35)
GFR calc Af Amer: 51 mL/min — ABNORMAL LOW (ref >90)
GFR calc non Af Amer: 44 mL/min — ABNORMAL LOW (ref >90)
Glucose, Bld: 180 mg/dL — ABNORMAL HIGH (ref 70–99)
Potassium: 3.1 meq/L — ABNORMAL LOW (ref 3.5–5.1)
Sodium: 139 meq/L (ref 135–145)

## 2013-01-01 LAB — CBC WITH DIFFERENTIAL/PLATELET
Basophils Absolute: 0 10*3/uL (ref 0.0–0.1)
Eosinophils Absolute: 0 10*3/uL (ref 0.0–0.7)
Eosinophils Relative: 0 % (ref 0–5)
HCT: 29.4 % — ABNORMAL LOW (ref 39.0–52.0)
Lymphocytes Relative: 8 % — ABNORMAL LOW (ref 12–46)
Lymphs Abs: 0.9 10*3/uL (ref 0.7–4.0)
MCV: 92.7 fL (ref 78.0–100.0)
Monocytes Absolute: 0.6 10*3/uL (ref 0.1–1.0)
Neutrophils Relative %: 86 % — ABNORMAL HIGH (ref 43–77)
Platelets: 87 10*3/uL — ABNORMAL LOW (ref 150–400)
RDW: 14.1 % (ref 11.5–15.5)
WBC: 10.6 10*3/uL — ABNORMAL HIGH (ref 4.0–10.5)

## 2013-01-01 LAB — URINE CULTURE: Colony Count: >=100000

## 2013-01-01 LAB — CULTURE, BLOOD (ROUTINE X 2)

## 2013-01-01 LAB — GLUCOSE, CAPILLARY
Glucose-Capillary: 240 mg/dL — ABNORMAL HIGH (ref 70–99)
Glucose-Capillary: 236 mg/dL — ABNORMAL HIGH (ref 70–99)

## 2013-01-01 LAB — PROTIME-INR: INR: 1.3 (ref 0.00–1.49)

## 2013-01-01 MED ORDER — POTASSIUM CHLORIDE CRYS ER 20 MEQ PO TBCR
20.0000 meq | EXTENDED_RELEASE_TABLET | Freq: Two times a day (BID) | ORAL | Status: AC
  Administered 2013-01-01 (×2): 20 meq via ORAL
  Filled 2013-01-01 (×2): qty 1

## 2013-01-01 NOTE — Progress Notes (Addendum)
[facility] PROGRESS NOTE  Guy [name] ZOX:[id number] DOB: [birth year] DOA: 12/29/2012 PCP: Terald Sleeper, MD  Assessment/Plan: Active Problems:   DM (diabetes mellitus)   Normocytic anemia   Malignant neoplasm of prostate   Acute encephalopathy   Aspiration pneumonia   Acute respiratory failure   Septic shock   Acute renal failure   Coagulation disorder    : Acute respiratory failure 2nd to altered mental status and pneumonia, likely aspiration, r/o edema (ischemia). Extubated 12/30/12. Satting well on 2L   On broad-spectrum antibiotics, oxygen requirements have been stable  Shock likely from sepsis with pneumonia. Shock is now resolved  Hx of HTN, CAD.  Stress ischemia Tro 2.9--1.99, on ASA  INR 1.51 upon admission -Resume some of home meds: lasix, lopressor, cozaar, zocor  -2 D echo shows EF of 55% grade 1 diastolic dysfunction -resume BB today   CKD (baseline 1.1-1.3). Cr 1.7-->1.5, improving. Urine output 1000 cc  Hypokalemia-resolved  Lactic acidosis 3.2-->3-->1.5- resolved Holding Lasix because of renal failure   Anemia of chronic disease. Baseline Hb 9-11, Hb 9.1 today  Leukocytosis is improving from 13.3-->10.7.  Thrombocytopenia likely from sepsis. Plt 79K  Hx of metastatic prostate cancer. F/u DIC panel. D dimer elevated 8.67, fibrinogen 383 , INR elevated, the check INR Hold heparin for now , Doppler lower extremities to rule out DVT SCD for DVT prevention  Platelet count of 79, CBC stable   HCAP vs UTI vs bacteremia. Blood ctx x 2 positive for gram neg rods, pending sensitivities  Hx of PCN allergy.   Continue Levaquin, azactam Day #4( has made clinical progres on this)- will switch to oral levofloxacin and doxycycline to cover for Klebsiella pneumonia UTI and bacteremia and MRSA pneumonia Renal US- no hydro  -Narrow abx once sensitivities are available  -Will call Dr Lavone Nian urology, plan in future foley, gets straight cath bid,    Diabetes type II.  Relative adrenal insufficiency 2nd to chronic prednisone use Continue sliding scale insulin   Code Status:No CPR, no defibrillation, DNI - full NCB  Family Communication: family updated about patient's clinical progress Disposition Plan:  We'll order PT OT evaluation   Brief narrative: 79 y/o M with PMH of HTN, DM, Prostate Cancer s/p prostatectomy with lung metastasis who as of late has been in SNF after kyphoplasty (10/2012) fo rehab efforts presented to [facility] ER on 4/1 with AMS and acute respiratory failure. Intubated in ER, PCCM called for ICU admit. Concern for CVA / aspiration.  SIGNIFICANT EVENTS / STUDIES:  4/01 - admit to [facility] with AMS, acute respiratory failure requiring intubation  4/2 - awake, follows commands, no pressors  LINES / TUBES:  4/1 OETT>>>12/30/12  4/1 L IJ TLC>>>  CULTURES:  4/1 Urine >>gram neg rods  4/1 Sputum >>  4/1 Blood ctx x2 >>>gram neg rods >>>  ANTIBIOTICS: PCN ALLERGIC  Aztreonam 4/1>>>  Levaquin 4/1>>>  Vanco 4/1>>>4/2   HPI/Subjective: No complaints   Objective: Filed Vitals:   12/31/12 1821 12/31/12 2100 01/01/13 0515 01/01/13 0908  BP: 173/90 161/76 159/74 166/67  Pulse: 70 64 69 69  Temp: 98.4 F (36.9 C) 98.1 F (36.7 C) 98.3 F (36.8 C) 99.2 F (37.3 C)  TempSrc: Oral Oral Oral   Resp: 20 20 20 17   Height: 5\' 5"  (1.651 m)     Weight: 73.4 kg (161 lb 13.1 oz)     SpO2: 99% 98% 95% 95%    Intake/Output Summary (Last 24 hours) at 01/01/13 8119 Last data  filed at 01/01/13 0909  Gross per 24 hour  Intake    130 ml  Output   1310 ml  Net  -1180 ml    Exam:  General: chronicallly ill appearing but improving, follow commands  Neuro: Follow commands, no focal neuro deficits  HEENT: extubated  Cardiovascular: s1s2 regular, bradycardic, no murmur  Lungs: Good air entry bilaterally, no wheeze, no crackles  Abdomen: Soft, scar lower abdomen, no organomegaly, decreased bowel sounds  Musculoskeletal: 2-3+  pitting edema up to mid-knees bilaterally  Skin: warm    Data Reviewed: Basic Metabolic Panel:  Recent Labs Lab 12/29/12 1431 12/29/12 1700 12/30/12 0500 12/31/12 0500 01/01/13 0540  NA 140 139 138 140 139  K 3.1* 3.6 3.8 3.5 3.1*  CL 108 108 107 110 108  CO2 21 21 20 19 22   GLUCOSE 94 131* 189* 143* 180*  BUN 31* 31* 38* 42* 37*  CREATININE 1.78* 1.80* 1.71* 1.56* 1.37*  CALCIUM 7.5* 7.6* 7.4* 7.5* 7.5*    Liver Function Tests:  Recent Labs Lab 12/29/12 1431 12/29/12 1700  AST 38* 44*  ALT 16 19  ALKPHOS 104 116  BILITOT 0.7 0.6  PROT 4.8* 5.2*  ALBUMIN 1.7* 1.8*   No results found for this basename: LIPASE, AMYLASE,  in the last 168 hours No results found for this basename: AMMONIA,  in the last 168 hours  CBC:  Recent Labs Lab 12/29/12 1431 12/29/12 1700 12/29/12 2000 12/30/12 0500 12/31/12 0500 01/01/13 0540  WBC 12.0* 13.3*  --  8.9 10.7* 10.6*  NEUTROABS 10.8*  --   --   --  9.5* 9.1*  HGB 8.4* 9.0*  --  8.3* 9.1* 10.2*  HCT 24.5* 27.2*  --  24.2* 26.5* 29.4*  MCV 93.9 95.4  --  93.1 93.0 92.7  PLT 71* 77* 102* 70* 79* 87*    Cardiac Enzymes:  Recent Labs Lab 12/29/12 1700 12/30/12 0054 12/30/12 0632 12/30/12 0805 12/30/12 1214  TROPONINI 2.43* 2.91* 2.32* 2.46* 1.99*   BNP (last 3 results) No results found for this basename: PROBNP,  in the last 8760 hours   CBG:  Recent Labs Lab 12/31/12 1218 12/31/12 1517 12/31/12 1823 12/31/12 2052 01/01/13 0752  GLUCAP 170* 195* 175* 207* 141*    Recent Results (from the past 240 hour(s))  URINE CULTURE     Status: None   Collection Time    12/29/12  3:00 PM      Result Value Range Status   Specimen Description URINE, CATHETERIZED   Final   Special Requests NONE   Final   Culture  Setup Time 12/29/2012 16:01   Final   Colony Count >=100,000 COLONIES/ML   Final   Culture KLEBSIELLA PNEUMONIAE   Final   Report Status 12/31/2012 FINAL   Final   Organism ID, Bacteria KLEBSIELLA  PNEUMONIAE   Final  CULTURE, BLOOD (ROUTINE X 2)     Status: None   Collection Time    12/29/12  4:45 PM      Result Value Range Status   Specimen Description BLOOD ARM RIGHT   Final   Special Requests BOTTLES DRAWN AEROBIC AND ANAEROBIC 10CC   Final   Culture  Setup Time 12/29/2012 23:40   Final   Culture     Final   Value: KLEBSIELLA PNEUMONIAE     Note: SUSCEPTIBILITIES PERFORMED ON PREVIOUS CULTURE WITHIN THE LAST 5 DAYS.     Note: Gram Stain Report Called to,Read Back By and Verified With: KELSEY  WHITE@1056  ON [id number] BY [facility]   Report Status 01/01/2013 FINAL   Final  URINE CULTURE     Status: None   Collection Time    12/29/12  4:56 PM      Result Value Range Status   Specimen Description URINE, CATHETERIZED   Final   Special Requests NONE   Final   Culture  Setup Time 12/30/2012 03:01   Final   Colony Count >=100,000 COLONIES/ML   Final   Culture KLEBSIELLA PNEUMONIAE   Final   Report Status 01/01/2013 FINAL   Final   Organism ID, Bacteria KLEBSIELLA PNEUMONIAE   Final  CULTURE, BLOOD (ROUTINE X 2)     Status: None   Collection Time    12/29/12  5:00 PM      Result Value Range Status   Specimen Description BLOOD HAND RIGHT   Final   Special Requests BOTTLES DRAWN AEROBIC AND ANAEROBIC 10CC   Final   Culture  Setup Time 12/29/2012 23:40   Final   Culture     Final   Value: KLEBSIELLA PNEUMONIAE     Note: Gram Stain Report Called to,Read Back By and Verified With: CHRIS M@1425  ON [id number] BY [facility]   Report Status 01/01/2013 FINAL   Final   Organism ID, Bacteria KLEBSIELLA PNEUMONIAE   Final  MRSA PCR SCREENING     Status: Abnormal   Collection Time    12/29/12  8:18 PM      Result Value Range Status   MRSA by PCR POSITIVE (*) NEGATIVE Final   Comment:            The GeneXpert MRSA Assay (FDA     approved for NASAL specimens     only), is one component of a     comprehensive MRSA colonization     surveillance program. It is not     intended to diagnose MRSA      infection nor to guide or     monitor treatment for     MRSA infections.     RESULT CALLED TO, READ BACK BY AND VERIFIED WITHLearta Codding RN 2230 12/29/12 A BROWNING  CULTURE, RESPIRATORY (NON-EXPECTORATED)     Status: None   Collection Time    12/29/12 11:41 PM      Result Value Range Status   Specimen Description TRACHEAL ASPIRATE   Final   Special Requests NONE   Final   Gram Stain     Final   Value: RARE WBC PRESENT, PREDOMINANTLY PMN     RARE SQUAMOUS EPITHELIAL CELLS PRESENT     ABUNDANT GRAM POSITIVE COCCI     IN PAIRS   Culture     Final   Value: ABUNDANT METHICILLIN RESISTANT STAPHYLOCOCCUS AUREUS     Note: RIFAMPIN AND GENTAMICIN SHOULD NOT BE USED AS SINGLE DRUGS FOR TREATMENT OF STAPH INFECTIONS. This organism is presumed to be Clindamycin resistant based on detection of inducible Clindamycin resistance. CRITICAL RESULT CALLED TO, READ BACK BY AND      VERIFIED WITH: ASHLEY T@7 ;38AM ON 01/01/13 BY [facility]   Report Status 01/01/2013 FINAL   Final   Organism ID, Bacteria METHICILLIN RESISTANT STAPHYLOCOCCUS AUREUS   Final     Studies: Ct Head Wo Contrast  12/29/2012  *RADIOLOGY REPORT*  Clinical Data: Altered mental status  CT HEAD WITHOUT CONTRAST  Technique:  Contiguous axial images were obtained from the base of the skull through the vertex without contrast.  Comparison: CT 11/16/2012  Findings: Moderate atrophy.  Chronic microvascular ischemic changes in the white matter are stable from prior study.  No acute infarct. Negative for hemorrhage or mass.  No midline shift.  Calvarium is intact.  There is chronic sinusitis, most prominent in the right maxillary sinus.  No acute bony abnormality.  IMPRESSION: Atrophy and chronic microvascular ischemia.  No acute intracranial abnormality.  Chronic sinusitis.   Original Report Authenticated By: Janeece Riggers, M.D.    US Renal Port  12/30/2012  *RADIOLOGY REPORT*  Clinical Data: Septic shock.  RENAL/URINARY TRACT ULTRASOUND COMPLETE   Comparison:  None.  Findings:  Right Kidney:  11.7 cm in length.  Diffuse increased echogenicity consistent with medical renal disease.  There is also mild renal cortical thinning.  83.8 cm upper pole cyst is noted.  No hydronephrosis.  Left Kidney:  10.0 cm in length.  Slight increased echogenicity and relatively normal renal cortical thickness.  A 1.6 cm upper pole cyst is noted.  No hydronephrosis.  Bladder:  Decompressed by Foley catheter.  IMPRESSION:  1.  Increased echogenicity of the right kidney suggesting medical renal disease. 2.  Bilateral cysts. 3.  No hydronephrosis.   Original Report Authenticated By: Rudie Meyer, M.D.    Dg Chest Port 1 View  12/30/2012  *RADIOLOGY REPORT*  Clinical Data: Follow up pneumonia  PORTABLE CHEST - 1 VIEW  Comparison: 12/29/2012  Findings: Endotracheal tube remains in good position.  Central venous catheter tip remains in the SVC.  NG tube has been placed since yesterday.  Improvement in superior mediastinal widening.  This may have been due to vascular congestion.  Diffuse bilateral airspace disease shows some improvement and this may be edema.  There are increasing pleural effusions and increasing bibasilar atelectasis.  IMPRESSION: Prominent superior mediastinum shows improvement.  This may be due to improving vascular congestion and edema.  Increasing bilateral pleural effusions and bibasilar atelectasis.   Original Report Authenticated By: Janeece Riggers, M.D.    Dg Chest Portable 1 View  12/29/2012  *RADIOLOGY REPORT*  Clinical Data: Central line placement.  PORTABLE CHEST - 1 VIEW  Comparison: 12/29/2012 1:17 p.m.  Findings: Endotracheal tube tip 2.3 cm above the carina.  Left central line has been placed and the tip is at the level of the distal superior vena cava.  No gross pneumothorax.  Mediastinum appears prominent.  There is may be related to the right upper lobe atelectasis/consolidation.  Primary mediastinal abnormality not excluded.  Pulmonary vascular  congestion most notable centrally.  Cardiomegaly.  IMPRESSION: Left central line has been placed and the tip is at the level of the distal superior vena cava.  No gross pneumothorax.  Mediastinum appears prominent.  There is may be related to the right upper lobe atelectasis/consolidation.  Primary mediastinal abnormality not excluded.  Pulmonary vascular congestion most notable centrally.  Cardiomegaly.  This is a call report.   Original Report Authenticated By: Lacy Duverney, M.D.    Dg Chest Portable 1 View  12/29/2012  *RADIOLOGY REPORT*  Clinical Data: Altered mental status, ETT adjustment  PORTABLE CHEST - 1 VIEW  Comparison: None.  Findings: Endotracheal tube terminates 3 cm above the carina.  Patchy right upper lobe opacity, suspicious for pneumonia.  Patchy bibasilar opacities, likely atelectasis.  Patchy/nodular opacity in the lateral left mid lung, suspicious for neoplasm on prior CT. No pleural effusion or pneumothorax.  Mild cardiomegaly.  IMPRESSION: Endotracheal tube terminates 3 cm above the carina.  Otherwise unchanged.   Original Report Authenticated By: Charline Bills,  M.D.    Dg Chest Portable 1 View  12/29/2012  *RADIOLOGY REPORT*  Clinical Data: Altered mental status, ETT placement  PORTABLE CHEST - 1 VIEW  Comparison: CT chest dated 11/14/2012  Findings: Endotracheal tube terminates 1 cm above the carina.  Mild patchy right upper lobe opacity, suspicious for pneumonia. Patchy bibasilar opacities, possibly atelectasis.  Nodular opacity in the left mid lung, corresponding to possible malignancy on prior CT.  No pleural effusion or pneumothorax.  Mild cardiomegaly.  IMPRESSION: Endotracheal tube terminates 1 cm above the carina.  Consider withdrawal approximately 2 cm.  Mild patchy right upper lobe opacity, suspicious for pneumonia.  Nodular opacity in the left mid lung, corresponding to possible malignancy on prior CT.   Original Report Authenticated By: Charline Bills, M.D.      Scheduled Meds: . antiseptic oral rinse  15 mL Mouth Rinse QID  . aspirin  325 mg Oral Daily  . aztreonam  1 g Intravenous Q8H  . chlorhexidine  15 mL Mouth/Throat BID  . Chlorhexidine Gluconate Cloth  6 each Topical Q0600  . cycloSPORINE  1 drop Both Eyes BID  . feeding supplement  237 mL Oral TID BM  . insulin aspart  0-9 Units Subcutaneous TID WC  . levofloxacin (LEVAQUIN) IV  750 mg Intravenous Q48H  . metoprolol tartrate  12.5 mg Oral BID  . mupirocin ointment  1 application Nasal BID  . pantoprazole  40 mg Oral Q1200  . vancomycin  750 mg Intravenous Q24H   Continuous Infusions:   Active Problems:   DM (diabetes mellitus)   Normocytic anemia   Malignant neoplasm of prostate   Acute encephalopathy   Aspiration pneumonia   Acute respiratory failure   Septic shock   Acute renal failure   Coagulation disorder    Time spent: 40 minutes   [facility]  [facility] Pager [phone]. If 8PM-8AM, please contact night-coverage at [email or web address], password [facility] 01/01/2013, 9:23 AM  LOS: 3 days

## 2013-01-01 NOTE — ED Provider Notes (Signed)
I saw and evaluated the patient, reviewed the resident's note and I agree with the findings and plan.   .Face to face Exam:  General:  Obtunded with Cheyn-Stokes respirations HEENT:  Bruising on head from recent fall Resp:  Periods of apnea Abd:  Nondistended Neuro:unconscious   Patient had DNR status but patient's wife wanted [place] to intubate him on short term basis until we could determine why he was having this problem.  Discussed with her the ramifications and possibiity that this would not be his wishes but she insisted on intubation  Patient inubeted with [facility]. No difficulties.    CRITICAL CARE Performed by: Nelia Shi   Total critical care time: 60 min  Critical care time was exclusive of separately billable procedures and treating other patients.  Critical care was necessary to treat or prevent imminent or life-threatening deterioration.  Critical care was time spent personally by me on the following activities: development of treatment plan with patient and/or surrogate as well as nursing, discussions with consultants, evaluation of patient's response to treatment, examination of patient, obtaining history from patient or surrogate, ordering and performing treatments and interventions, ordering and review of laboratory studies, ordering and review of radiographic studies, pulse oximetry and re-evaluation of patient's condition.   Nelia Shi, MD 01/01/13 1125

## 2013-01-01 NOTE — Clinical Social Work Note (Signed)
CSW contacted Social research officer, government at [facility] regarding Saturday discharge and patient can return on Saturday. CSW advised that weekend CSW can call the SNF and ask for weekend supervisor regarding sending discharge paperwork.  Genelle Bal, MSW, LCSW [phone]

## 2013-01-02 DIAGNOSIS — M7989 Other specified soft tissue disorders: Secondary | ICD-10-CM

## 2013-01-02 DIAGNOSIS — M79609 Pain in unspecified limb: Secondary | ICD-10-CM

## 2013-01-02 LAB — BASIC METABOLIC PANEL
Chloride: 110 mEq/L (ref 96–112)
Creatinine, Ser: 1.25 mg/dL (ref 0.50–1.35)
GFR calc Af Amer: 57 mL/min — ABNORMAL LOW (ref >90)
Potassium: 4.1 mEq/L (ref 3.5–5.1)
Sodium: 140 mEq/L (ref 135–145)

## 2013-01-02 LAB — CULTURE, BLOOD (ROUTINE X 2)

## 2013-01-02 LAB — GLUCOSE, CAPILLARY

## 2013-01-02 LAB — CBC
HCT: 32 % — ABNORMAL LOW (ref 39.0–52.0)
RDW: 13.8 % (ref 11.5–15.5)
WBC: 9.1 10*3/uL (ref 4.0–10.5)

## 2013-01-02 MED ORDER — ENOXAPARIN SODIUM 60 MG/0.6ML ~~LOC~~ SOLN
1.0000 mg/kg | SUBCUTANEOUS | Status: DC
  Administered 2013-01-02: 50 mg via SUBCUTANEOUS
  Filled 2013-01-02 (×2): qty 0.6

## 2013-01-02 MED ORDER — ASPIRIN 81 MG PO CHEW
81.0000 mg | CHEWABLE_TABLET | Freq: Every day | ORAL | Status: DC
  Administered 2013-01-02 – 2013-01-04 (×3): 81 mg via ORAL
  Filled 2013-01-02 (×3): qty 1

## 2013-01-02 MED ORDER — WARFARIN SODIUM 2.5 MG PO TABS
2.5000 mg | ORAL_TABLET | Freq: Once | ORAL | Status: AC
  Administered 2013-01-02: 2.5 mg via ORAL
  Filled 2013-01-02: qty 1

## 2013-01-02 MED ORDER — COUMADIN BOOK
Freq: Once | Status: AC
  Administered 2013-01-02: 12:00:00
  Filled 2013-01-02: qty 1

## 2013-01-02 MED ORDER — ASPIRIN 325 MG PO TABS: 325.0000 mg | ORAL_TABLET | Freq: Every day | ORAL | Status: DC

## 2013-01-02 MED ORDER — DOXYCYCLINE HYCLATE 50 MG PO CAPS: 100.0000 mg | ORAL_CAPSULE | Freq: Two times a day (BID) | ORAL | Status: DC

## 2013-01-02 MED ORDER — LEVOFLOXACIN 750 MG PO TABS: 750.0000 mg | ORAL_TABLET | Freq: Every day | ORAL | Status: DC

## 2013-01-02 MED ORDER — PANTOPRAZOLE SODIUM 40 MG PO TBEC: 40.0000 mg | DELAYED_RELEASE_TABLET | Freq: Every day | ORAL | Status: DC

## 2013-01-02 MED ORDER — WARFARIN - PHARMACIST DOSING INPATIENT
Freq: Every day | Status: DC
  Administered 2013-01-02: 18:00:00

## 2013-01-02 MED ORDER — WARFARIN VIDEO
Freq: Once | Status: AC
  Administered 2013-01-02: 12:00:00

## 2013-01-02 NOTE — Progress Notes (Signed)
Cancel DC ,started coumadin and lovenox, after discussiong all options including IVC filter,

## 2013-01-02 NOTE — Progress Notes (Signed)
ANTICOAGULATION and ANTIBIOTC CONSULT NOTE  Pharmacy Consult for Vancomycin, Levaquin, Lovenox, Warfarin Indication: New LLE DVT, MRSA PNA, Klebsiella UTI and Bacteremia  Allergies  Allergen Reactions  . Albuterol Sulfate Hfa (UXL:[id number]) Shortness Of Breath  . Adhesive (Tape) Other (See Comments)    REACTION: SKIN BLISTERS  . Penicillins Other (See Comments)    REACTION: ITCHING HANDS  . Sulfa Drugs Cross Reactors Hives    Patient Measurements: Height: 5\' 10"  (177.8 cm) Weight: 106 lb 9.6 oz (48.353 kg) IBW/kg (Calculated) : 73  Vital Signs: Temp: 98.1 F (36.7 C) (04/05 0949) Temp src: Oral (04/05 0949) BP: 160/82 mmHg (04/05 0949) Pulse Rate: 77 (04/05 0949)  Labs:  Recent Labs  12/30/12 1214  12/31/12 0500 01/01/13 0540 01/01/13 0940 01/02/13 0610  HGB  --   < > 9.1* 10.2*  --  10.8*  HCT  --   --  26.5* 29.4*  --  32.0*  PLT  --   --  79* 87*  --  78*  LABPROT  --   --   --   --  15.9*  --   INR  --   --   --   --  1.30  --   CREATININE  --   --  1.56* 1.37*  --  1.25  TROPONINI 1.99*  --   --   --   --   --   < > = values in this interval not displayed.  Estimated Creatinine Clearance: 26.9 ml/min (by C-G formula based on Cr of 1.25).   Medical History: Past Medical History  Diagnosis Date  . Hypertension   . Diabetes mellitus without complication   . Arthritis   . Sciatica   . GERD (gastroesophageal reflux disease)   . Bladder cancer   . Urge incontinence   . Prostate cancer     S/P prostatectomy; Lung metastasis  . Vertigo   . Hyperlipidemia   . ED (erectile dysfunction)   . Gastric polyposis   . Osteoporosis   . Decreased libido   . Depression   . Hepatitis A   . CAD (coronary artery disease)   . Stroke   . History of blood transfusion     1946  . OA (osteoarthritis)     Medications:  Scheduled:  . antiseptic oral rinse  15 mL Mouth Rinse QID  . aspirin  81 mg Oral Daily  . chlorhexidine  15 mL Mouth/Throat BID  .  Chlorhexidine Gluconate Cloth  6 each Topical Q0600  . cycloSPORINE  1 drop Both Eyes BID  . enoxaparin (LOVENOX) injection  1 mg/kg Subcutaneous Q24H  . feeding supplement  237 mL Oral TID BM  . insulin aspart  0-9 Units Subcutaneous TID WC  . levofloxacin (LEVAQUIN) IV  750 mg Intravenous Q48H  . metoprolol tartrate  12.5 mg Oral BID  . mupirocin ointment  1 application Nasal BID  . pantoprazole  40 mg Oral Q1200  . [COMPLETED] potassium chloride  20 mEq Oral BID  . vancomycin  750 mg Intravenous Q24H  . [DISCONTINUED] aspirin  325 mg Oral Daily  . [DISCONTINUED] aztreonam  1 g Intravenous Q8H    Assessment: 79 y/o M with new finding of LLE DVT involving the common femoral and proximal femoral veins. Per MD, to start lovenox bridging to warfarin today. Pt also has MRSA PNA and Klebsiella UTI/Bacteremia. Scr improving at 1.25<1.37 with CrCl still <30. WBC 9.1. Baseline INR from 4/4 is 1.30. Noted DDI with  levaquin. MD lowering ASA dose to 81mg  from 325mg .   Goal of Therapy:  INR 2-3 Vancomycin Trough 15-20 mg/L Monitor platelets by anticoagulation protocol: Yes   Plan:  -Start Lovenox 50 mg [place] q24h due to renal function -Start Warfarin 2.5mg  x 1 tonight at 1800 -Daily PT/INR -CBC minimum q72h -Monitor for bleeding  -Continue vancomycin 750 mg IV q24h -Continue Levaquin 750 mg IV q24h -F/U clinical progression, LOT  Abran Duke, PharmD Clinical Pharmacist Phone: [phone] Pager: [phone] 01/02/2013 11:16 AM

## 2013-01-02 NOTE — Discharge Summary (Signed)
Physician Discharge Summary  SALLY REIMERS MRN: [id number] DOB/AGE: 11/05/1919 79 y.o.  PCP: Terald Sleeper, MD   Admit date: 12/29/2012 Discharge date: 01/02/2013  Discharge Diagnoses:  Klebsiella pneumonia bacteremia secondary to UTI Coagulopathy secondary to sepsis MRSA pneumonia   DM (diabetes mellitus)   Normocytic anemia   Malignant neoplasm of prostate   Acute encephalopathy   Aspiration pneumonia   Acute respiratory failure   Septic shock   Acute renal failure   Coagulation disorder     Medication List    STOP taking these medications       furosemide 20 MG tablet  Commonly known as:  LASIX     losartan 50 MG tablet  Commonly known as:  COZAAR     naproxen sodium 220 MG tablet  Commonly known as:  ANAPROX     predniSONE 20 MG tablet  Commonly known as:  DELTASONE      TAKE these medications       acetaminophen 325 MG tablet  Commonly known as:  TYLENOL  Take 325 mg by mouth daily.     aspirin 325 MG tablet  Take 1 tablet (325 mg total) by mouth daily.     bicalutamide 50 MG tablet  Commonly known as:  CASODEX  Take 50 mg by mouth daily.     bisacodyl 10 MG suppository  Commonly known as:  DULCOLAX  Place 10 mg rectally as needed for constipation (for constipation not relieved by MOM).     CALCIUM 600+D HIGH POTENCY 600-400 MG-UNIT per tablet  Generic drug:  Calcium Carbonate-Vitamin D  Take 1 tablet by mouth 2 (two) times daily.     docusate sodium 100 MG capsule  Commonly known as:  COLACE  Take 100 mg by mouth 2 (two) times daily.     doxycycline 50 MG capsule  Commonly known as:  VIBRAMYCIN  Take 2 capsules (100 mg total) by mouth 2 (two) times daily.     gabapentin 100 MG capsule  Commonly known as:  NEURONTIN  Take 100 mg by mouth 3 (three) times daily.     glimepiride 4 MG tablet  Commonly known as:  AMARYL  Take 4 mg by mouth daily before breakfast.     insulin aspart 100 UNIT/ML injection  Commonly known as:   novoLOG  Inject 8 Units into the skin once.     insulin glargine 100 UNIT/ML injection  Commonly known as:  LANTUS  Inject 14 Units into the skin at bedtime.     levofloxacin 750 MG tablet  Commonly known as:  LEVAQUIN  Take 1 tablet (750 mg total) every 48 hours until 4/19      loratadine 10 MG tablet  Commonly known as:  CLARITIN  Take 10 mg by mouth daily as needed for allergies.     magnesium hydroxide 400 MG/5ML suspension  Commonly known as:  MILK OF MAGNESIA  Take 30 mLs by mouth daily as needed for constipation.     meclizine 25 MG tablet  Commonly known as:  ANTIVERT  Take 25 mg by mouth 3 (three) times daily as needed for dizziness.     metoprolol 50 MG tablet  Commonly known as:  LOPRESSOR  Take 25 mg by mouth 2 (two) times daily.     multivitamins ther. w/minerals Tabs  Take 1 tablet by mouth every morning.     nitroGLYCERIN 0.4 MG SL tablet  Commonly known as:  NITROSTAT  Place 0.4 mg under  the tongue every 5 (five) minutes as needed for chest pain.     omeprazole 20 MG capsule  Commonly known as:  PRILOSEC  Take 20 mg by mouth 2 (two) times daily.     oxybutynin 5 MG tablet  Commonly known as:  DITROPAN  Take 5 mg by mouth at bedtime.     pantoprazole 40 MG tablet  Commonly known as:  PROTONIX  Take 1 tablet (40 mg total) by mouth daily at 12 noon.     polyethylene glycol packet  Commonly known as:  MIRALAX / GLYCOLAX  Take 17 g by mouth daily.     potassium chloride 10 MEQ tablet  Commonly known as:  K-DUR  Take 1 tablet (10 mEq total) by mouth daily.     RESTASIS 0.05 % ophthalmic emulsion  Generic drug:  cycloSPORINE  Place 1 drop into both eyes 2 (two) times daily.     sertraline 100 MG tablet  Commonly known as:  ZOLOFT  Take 200 mg by mouth at bedtime.     simvastatin 40 MG tablet  Commonly known as:  ZOCOR  Take 40 mg by mouth at bedtime.     sodium phosphate enema  Commonly known as:  FLEET  Place 1 enema rectally once as  needed (for constipation not relieved by MOM or Bisacodyl Supp.). follow package directions     traMADol 50 MG tablet  Commonly known as:  ULTRAM  Take 25 mg by mouth every 8 (eight) hours.        Discharge Condition: Stable Disposition: 03-Skilled Nursing Facility   Consults:  Critical care  Significant Diagnostic Studies: Ct Head Wo Contrast  12/29/2012  *RADIOLOGY REPORT*  Clinical Data: Altered mental status  CT HEAD WITHOUT CONTRAST  Technique:  Contiguous axial images were obtained from the base of the skull through the vertex without contrast.  Comparison: CT 11/16/2012  Findings: Moderate atrophy.  Chronic microvascular ischemic changes in the white matter are stable from prior study.  No acute infarct. Negative for hemorrhage or mass.  No midline shift.  Calvarium is intact.  There is chronic sinusitis, most prominent in the right maxillary sinus.  No acute bony abnormality.  IMPRESSION: Atrophy and chronic microvascular ischemia.  No acute intracranial abnormality.  Chronic sinusitis.   Original Report Authenticated By: Janeece Riggers, M.D.    US Renal Port  12/30/2012  *RADIOLOGY REPORT*  Clinical Data: Septic shock.  RENAL/URINARY TRACT ULTRASOUND COMPLETE  Comparison:  None.  Findings:  Right Kidney:  11.7 cm in length.  Diffuse increased echogenicity consistent with medical renal disease.  There is also mild renal cortical thinning.  83.8 cm upper pole cyst is noted.  No hydronephrosis.  Left Kidney:  10.0 cm in length.  Slight increased echogenicity and relatively normal renal cortical thickness.  A 1.6 cm upper pole cyst is noted.  No hydronephrosis.  Bladder:  Decompressed by Foley catheter.  IMPRESSION:  1.  Increased echogenicity of the right kidney suggesting medical renal disease. 2.  Bilateral cysts. 3.  No hydronephrosis.   Original Report Authenticated By: Rudie Meyer, M.D.    Dg Chest Port 1 View  12/30/2012  *RADIOLOGY REPORT*  Clinical Data: Follow up pneumonia  PORTABLE  CHEST - 1 VIEW  Comparison: 12/29/2012  Findings: Endotracheal tube remains in good position.  Central venous catheter tip remains in the SVC.  NG tube has been placed since yesterday.  Improvement in superior mediastinal widening.  This may have been due to  vascular congestion.  Diffuse bilateral airspace disease shows some improvement and this may be edema.  There are increasing pleural effusions and increasing bibasilar atelectasis.  IMPRESSION: Prominent superior mediastinum shows improvement.  This may be due to improving vascular congestion and edema.  Increasing bilateral pleural effusions and bibasilar atelectasis.   Original Report Authenticated By: Janeece Riggers, M.D.    Dg Chest Portable 1 View  12/29/2012  *RADIOLOGY REPORT*  Clinical Data: Central line placement.  PORTABLE CHEST - 1 VIEW  Comparison: 12/29/2012 1:17 p.m.  Findings: Endotracheal tube tip 2.3 cm above the carina.  Left central line has been placed and the tip is at the level of the distal superior vena cava.  No gross pneumothorax.  Mediastinum appears prominent.  There is may be related to the right upper lobe atelectasis/consolidation.  Primary mediastinal abnormality not excluded.  Pulmonary vascular congestion most notable centrally.  Cardiomegaly.  IMPRESSION: Left central line has been placed and the tip is at the level of the distal superior vena cava.  No gross pneumothorax.  Mediastinum appears prominent.  There is may be related to the right upper lobe atelectasis/consolidation.  Primary mediastinal abnormality not excluded.  Pulmonary vascular congestion most notable centrally.  Cardiomegaly.  This is a call report.   Original Report Authenticated By: Lacy Duverney, M.D.    Dg Chest Portable 1 View  12/29/2012  *RADIOLOGY REPORT*  Clinical Data: Altered mental status, ETT adjustment  PORTABLE CHEST - 1 VIEW  Comparison: None.  Findings: Endotracheal tube terminates 3 cm above the carina.  Patchy right upper lobe opacity,  suspicious for pneumonia.  Patchy bibasilar opacities, likely atelectasis.  Patchy/nodular opacity in the lateral left mid lung, suspicious for neoplasm on prior CT. No pleural effusion or pneumothorax.  Mild cardiomegaly.  IMPRESSION: Endotracheal tube terminates 3 cm above the carina.  Otherwise unchanged.   Original Report Authenticated By: Charline Bills, M.D.    Dg Chest Portable 1 View  12/29/2012  *RADIOLOGY REPORT*  Clinical Data: Altered mental status, ETT placement  PORTABLE CHEST - 1 VIEW  Comparison: CT chest dated 11/14/2012  Findings: Endotracheal tube terminates 1 cm above the carina.  Mild patchy right upper lobe opacity, suspicious for pneumonia. Patchy bibasilar opacities, possibly atelectasis.  Nodular opacity in the left mid lung, corresponding to possible malignancy on prior CT.  No pleural effusion or pneumothorax.  Mild cardiomegaly.  IMPRESSION: Endotracheal tube terminates 1 cm above the carina.  Consider withdrawal approximately 2 cm.  Mild patchy right upper lobe opacity, suspicious for pneumonia.  Nodular opacity in the left mid lung, corresponding to possible malignancy on prior CT.   Original Report Authenticated By: Charline Bills, M.D.        Microbiology: Recent Results (from the past 240 hour(s))  URINE CULTURE     Status: None   Collection Time    12/29/12  3:00 PM      Result Value Range Status   Specimen Description URINE, CATHETERIZED   Final   Special Requests NONE   Final   Culture  Setup Time 12/29/2012 16:01   Final   Colony Count >=100,000 COLONIES/ML   Final   Culture KLEBSIELLA PNEUMONIAE   Final   Report Status 12/31/2012 FINAL   Final   Organism ID, Bacteria KLEBSIELLA PNEUMONIAE   Final  CULTURE, BLOOD (ROUTINE X 2)     Status: None   Collection Time    12/29/12  4:45 PM      Result Value Range Status  Specimen Description BLOOD ARM RIGHT   Final   Special Requests BOTTLES DRAWN AEROBIC AND ANAEROBIC 10CC   Final   Culture  Setup Time  12/29/2012 23:40   Final   Culture     Final   Value: KLEBSIELLA PNEUMONIAE     Note: SUSCEPTIBILITIES PERFORMED ON PREVIOUS CULTURE WITHIN THE LAST 5 DAYS.     Note: Gram Stain Report Called to,Read Back By and Verified With: KELSEY WHITE@1056  ON [id number] BY [facility]   Report Status 01/01/2013 FINAL   Final  URINE CULTURE     Status: None   Collection Time    12/29/12  4:56 PM      Result Value Range Status   Specimen Description URINE, CATHETERIZED   Final   Special Requests NONE   Final   Culture  Setup Time 12/30/2012 03:01   Final   Colony Count >=100,000 COLONIES/ML   Final   Culture KLEBSIELLA PNEUMONIAE   Final   Report Status 01/01/2013 FINAL   Final   Organism ID, Bacteria KLEBSIELLA PNEUMONIAE   Final  CULTURE, BLOOD (ROUTINE X 2)     Status: None   Collection Time    12/29/12  5:00 PM      Result Value Range Status   Specimen Description BLOOD HAND RIGHT   Final   Special Requests BOTTLES DRAWN AEROBIC AND ANAEROBIC 10CC   Final   Culture  Setup Time 12/29/2012 23:40   Final   Culture     Final   Value: KLEBSIELLA PNEUMONIAE     Note: Gram Stain Report Called to,Read Back By and Verified With: CHRIS M@1425  ON [id number] BY [facility]   Report Status PENDING   Incomplete   Organism ID, Bacteria KLEBSIELLA PNEUMONIAE   Final  MRSA PCR SCREENING     Status: Abnormal   Collection Time    12/29/12  8:18 PM      Result Value Range Status   MRSA by PCR POSITIVE (*) NEGATIVE Final   Comment:            The GeneXpert MRSA Assay (FDA     approved for NASAL specimens     only), is one component of a     comprehensive MRSA colonization     surveillance program. It is not     intended to diagnose MRSA     infection nor to guide or     monitor treatment for     MRSA infections.     RESULT CALLED TO, READ BACK BY AND VERIFIED WITHLearta Codding RN 2230 12/29/12 A BROWNING  CULTURE, RESPIRATORY (NON-EXPECTORATED)     Status: None   Collection Time    12/29/12 11:41 PM      Result Value  Range Status   Specimen Description TRACHEAL ASPIRATE   Final   Special Requests NONE   Final   Gram Stain     Final   Value: RARE WBC PRESENT, PREDOMINANTLY PMN     RARE SQUAMOUS EPITHELIAL CELLS PRESENT     ABUNDANT GRAM POSITIVE COCCI     IN PAIRS   Culture     Final   Value: ABUNDANT METHICILLIN RESISTANT STAPHYLOCOCCUS AUREUS     Note: RIFAMPIN AND GENTAMICIN SHOULD NOT BE USED AS SINGLE DRUGS FOR TREATMENT OF STAPH INFECTIONS. This organism is presumed to be Clindamycin resistant based on detection of inducible Clindamycin resistance. CRITICAL RESULT CALLED TO, READ BACK BY AND      VERIFIED  WITH: ASHLEY T@7 ;38AM ON 01/01/13 BY [facility]   Report Status 01/01/2013 FINAL   Final   Organism ID, Bacteria METHICILLIN RESISTANT STAPHYLOCOCCUS AUREUS   Final     Labs: Results for orders placed during the hospital encounter of 12/29/12 (from the past 48 hour(s))  GLUCOSE, CAPILLARY     Status: Abnormal   Collection Time    12/31/12 12:18 PM      Result Value Range   Glucose-Capillary 170 (*) 70 - 99 mg/dL  GLUCOSE, CAPILLARY     Status: Abnormal   Collection Time    12/31/12  3:17 PM      Result Value Range   Glucose-Capillary 195 (*) 70 - 99 mg/dL  GLUCOSE, CAPILLARY     Status: Abnormal   Collection Time    12/31/12  6:23 PM      Result Value Range   Glucose-Capillary 175 (*) 70 - 99 mg/dL  GLUCOSE, CAPILLARY     Status: Abnormal   Collection Time    12/31/12  8:52 PM      Result Value Range   Glucose-Capillary 207 (*) 70 - 99 mg/dL  CBC WITH DIFFERENTIAL     Status: Abnormal   Collection Time    01/01/13  5:40 AM      Result Value Range   WBC 10.6 (*) 4.0 - 10.5 K/uL   RBC 3.17 (*) 4.22 - 5.81 MIL/uL   Hemoglobin 10.2 (*) 13.0 - 17.0 g/dL   HCT 16.1 (*) 09.6 - 04.5 %   MCV 92.7  78.0 - 100.0 fL   MCH 32.2  26.0 - 34.0 pg   MCHC 34.7  30.0 - 36.0 g/dL   RDW 40.9  81.1 - 91.4 %   Platelets 87 (*) 150 - 400 K/uL   Comment: CONSISTENT WITH PREVIOUS RESULT   Neutrophils  Relative 86 (*) 43 - 77 %   Neutro Abs 9.1 (*) 1.7 - 7.7 K/uL   Lymphocytes Relative 8 (*) 12 - 46 %   Lymphs Abs 0.9  0.7 - 4.0 K/uL   Monocytes Relative 5  3 - 12 %   Monocytes Absolute 0.6  0.1 - 1.0 K/uL   Eosinophils Relative 0  0 - 5 %   Eosinophils Absolute 0.0  0.0 - 0.7 K/uL   Basophils Relative 0  0 - 1 %   Basophils Absolute 0.0  0.0 - 0.1 K/uL  BASIC METABOLIC PANEL     Status: Abnormal   Collection Time    01/01/13  5:40 AM      Result Value Range   Sodium 139  135 - 145 mEq/L   Potassium 3.1 (*) 3.5 - 5.1 mEq/L   Chloride 108  96 - 112 mEq/L   CO2 22  19 - 32 mEq/L   Glucose, Bld 180 (*) 70 - 99 mg/dL   BUN 37 (*) 6 - 23 mg/dL   Creatinine, Ser 7.82 (*) 0.50 - 1.35 mg/dL   Calcium 7.5 (*) 8.4 - 10.5 mg/dL   GFR calc non Af Amer 44 (*) >90 mL/min   GFR calc Af Amer 51 (*) >90 mL/min   Comment:            The eGFR has been calculated     using the CKD EPI equation.     This calculation has not been     validated in all clinical     situations.     eGFR's persistently     <90 mL/min signify  possible Chronic Kidney Disease.  GLUCOSE, CAPILLARY     Status: Abnormal   Collection Time    01/01/13  7:52 AM      Result Value Range   Glucose-Capillary 141 (*) 70 - 99 mg/dL  PROTIME-INR     Status: Abnormal   Collection Time    01/01/13  9:40 AM      Result Value Range   Prothrombin Time 15.9 (*) 11.6 - 15.2 seconds   INR 1.30  0.00 - 1.49  GLUCOSE, CAPILLARY     Status: Abnormal   Collection Time    01/01/13 11:36 AM      Result Value Range   Glucose-Capillary 240 (*) 70 - 99 mg/dL  GLUCOSE, CAPILLARY     Status: Abnormal   Collection Time    01/01/13  4:51 PM      Result Value Range   Glucose-Capillary 296 (*) 70 - 99 mg/dL  GLUCOSE, CAPILLARY     Status: Abnormal   Collection Time    01/01/13  9:07 PM      Result Value Range   Glucose-Capillary 236 (*) 70 - 99 mg/dL   Comment 1 Documented in Chart     Comment 2 Notify RN    BASIC METABOLIC PANEL      Status: Abnormal   Collection Time    01/02/13  6:10 AM      Result Value Range   Sodium 140  135 - 145 mEq/L   Potassium 4.1  3.5 - 5.1 mEq/L   Comment: DELTA CHECK NOTED   Chloride 110  96 - 112 mEq/L   CO2 24  19 - 32 mEq/L   Glucose, Bld 213 (*) 70 - 99 mg/dL   BUN 24 (*) 6 - 23 mg/dL   Comment: DELTA CHECK NOTED   Creatinine, Ser 1.25  0.50 - 1.35 mg/dL   Calcium 8.1 (*) 8.4 - 10.5 mg/dL   GFR calc non Af Amer 49 (*) >90 mL/min   GFR calc Af Amer 57 (*) >90 mL/min   Comment:            The eGFR has been calculated     using the CKD EPI equation.     This calculation has not been     validated in all clinical     situations.     eGFR's persistently     <90 mL/min signify     possible Chronic Kidney Disease.  CBC     Status: Abnormal   Collection Time    01/02/13  6:10 AM      Result Value Range   WBC 9.1  4.0 - 10.5 K/uL   RBC 3.43 (*) 4.22 - 5.81 MIL/uL   Hemoglobin 10.8 (*) 13.0 - 17.0 g/dL   HCT 16.1 (*) 09.6 - 04.5 %   MCV 93.3  78.0 - 100.0 fL   MCH 31.5  26.0 - 34.0 pg   MCHC 33.8  30.0 - 36.0 g/dL   RDW 40.9  81.1 - 91.4 %   Platelets 78 (*) 150 - 400 K/uL   Comment: CONSISTENT WITH PREVIOUS RESULT     HPI :79 y/o M with PMH of HTN, DM, Prostate Cancer s/p prostatectomy with lung metastasis who as of late has been in SNF after kyphoplasty (10/2012) fo rehab efforts presented to [facility] ER on 4/1 with AMS and acute respiratory failure. Intubated in ER, PCCM called for ICU admit. Concern for CVA / aspiration  HOSPITAL COURSE: #1 MRSA  pneumonia Patient treated with broad-spectrum antibiotics namely vancomycin, aspirin, levofloxacin Intubated on 4/1, extubated on 4/2 Cultures obtained showed Klebsiella pneumonia bacteremia/UTI and MRSA in the sputum The patient will continue with oral doxycycline 100 mg by mouth twice a day for 2 weeks  #2 Klebsiella pneumonia bacteremia/UTI Patient will continue with levofloxacin 750 mg every 48 hours for another 2 weeks The  patient catheterizes intermittently, He is being discharged on a Foley catheter which can be removed in one to 2 weeks after followup with urology At the end of his 2 week course of levofloxacin the patient can resume his Macrodantin   #3 septic shock secondary to pneumonia and UTI Elevated troponin 2.9- 1.99 INR was also elevated to 1.5 on upon admission These parameters were consistent with septic shock and coagulopathy related to sepsis 2-D echo showed normal EF of 55% with grade 1 diastolic dysfunction Patient did not require any vasopressors  #4 CK D. Baseline 1.1-1.3 Creatinine 1.7 upon admission Patient was also found a lactic acidosis with lactic acid of 3.2, renal ultrasound did not show any hydronephrosis  HIS cOZAAR AND HIS lASIX HAVE BEEN HELD These can be resumed upon followup with his primary care provider in a week  BMP in a week   #5 anemia/thrombocytopenia Thrombocytopenia likely from sepsis. Plt 79K  Hx of metastatic prostate cancer. DSE panel abnormal with d-dimer of 3.67 upon admission, INR was also elevated Doppler bilateral lower extremities were done to rule out DVT the results pending at this time Heparin was avoided because of thrombocytopenia Recommend repeat CBC weekly   #6 diabetes type 2 Patient was on prednisone at one point otherwise states that the prednisone was discontinued He can resume his outpatient regimen      Discharge Exam:  Blood pressure 149/62, pulse 58, temperature 99 F (37.2 C), temperature source Oral, resp. rate 18, height 5\' 10"  (1.778 m), weight 48.353 kg (106 lb 9.6 oz), SpO2 95.00%.  General: chronicallly ill appearing but improving, follow commands  Neuro: Follow commands, no focal neuro deficits  HEENT: extubated  Cardiovascular: s1s2 regular, bradycardic, no murmur  Lungs: Good air entry bilaterally, no wheeze, no crackles  Abdomen: Soft, scar lower abdomen, no organomegaly, decreased bowel sounds  Musculoskeletal:  2-3+ pitting edema up to mid-knees bilaterally  Skin: warm        Signed: Amylah Will 01/02/2013, 8:38 AM

## 2013-01-02 NOTE — Progress Notes (Signed)
*  PRELIMINARY RESULTS* Vascular Ultrasound Lower extremity venous duplex has been completed.  Preliminary findings: Right= Superficial thrombosis involving the proximal great saphenous vein. There appears to be thrombosis in the proximal calf. Cannot clearly visualize to determine superficial vs deep. Possibly in gastroc vein.  Left = DVT involving the common femoral vein and proximal femoral vein. Superficial thrombosis involving the proximal great saphenous vein.  Farrel Demark, RDMS, RVT  01/02/2013, 9:18 AM

## 2013-01-02 NOTE — Progress Notes (Signed)
Order received, chart reviewed, noted plan is for pt to D/C to SNF today, will defer OT eval to that facility as they deem appropriate. Acute OT will sign off.

## 2013-01-03 LAB — GLUCOSE, CAPILLARY
Glucose-Capillary: 180 mg/dL — ABNORMAL HIGH (ref 70–99)
Glucose-Capillary: 186 mg/dL — ABNORMAL HIGH (ref 70–99)

## 2013-01-03 LAB — CBC
HCT: 32 % — ABNORMAL LOW (ref 39.0–52.0)
MCV: 92.2 fL (ref 78.0–100.0)
RBC: 3.47 MIL/uL — ABNORMAL LOW (ref 4.22–5.81)
RDW: 13.6 % (ref 11.5–15.5)
WBC: 12.3 10*3/uL — ABNORMAL HIGH (ref 4.0–10.5)

## 2013-01-03 LAB — PROTIME-INR: Prothrombin Time: 15.6 seconds — ABNORMAL HIGH (ref 11.6–15.2)

## 2013-01-03 MED ORDER — ENOXAPARIN SODIUM 80 MG/0.8ML ~~LOC~~ SOLN
1.0000 mg/kg | Freq: Two times a day (BID) | SUBCUTANEOUS | Status: DC
  Administered 2013-01-03 – 2013-01-04 (×3): 70 mg via SUBCUTANEOUS
  Filled 2013-01-03 (×5): qty 0.8

## 2013-01-03 MED ORDER — WARFARIN SODIUM 2.5 MG PO TABS
2.5000 mg | ORAL_TABLET | Freq: Once | ORAL | Status: AC
  Administered 2013-01-03: 2.5 mg via ORAL
  Filled 2013-01-03: qty 1

## 2013-01-03 NOTE — Progress Notes (Signed)
ANTICOAGULATION CONSULT NOTE  Pharmacy Consult for Lovenox, Warfarin Indication: LLE DVT  Allergies  Allergen Reactions  . Albuterol Sulfate Hfa (NFA:[id number]) Shortness Of Breath  . Adhesive (Tape) Other (See Comments)    REACTION: SKIN BLISTERS  . Penicillins Other (See Comments)    REACTION: ITCHING HANDS  . Sulfa Drugs Cross Reactors Hives    Patient Measurements: Height: 5\' 10"  (177.8 cm) Weight: 158 lb 1.6 oz (71.714 kg) IBW/kg (Calculated) : 73  Vital Signs: Temp: 98.4 F (36.9 C) (04/06 0531) Temp src: Oral (04/06 0531) BP: 183/80 mmHg (04/06 0531) Pulse Rate: 80 (04/06 0531)  Labs:  Recent Labs  01/01/13 0540 01/01/13 0940 01/02/13 0610 01/02/13 1758 01/03/13 0510  HGB 10.2*  --  10.8*  --  10.9*  HCT 29.4*  --  32.0*  --  32.0*  PLT 87*  --  78*  --  102*  LABPROT  --  15.9*  --  16.0* 15.6*  INR  --  1.30  --  1.31 1.27  CREATININE 1.37*  --  1.25  --   --     Estimated Creatinine Clearance: 39.8 ml/min (by C-G formula based on Cr of 1.25).   Medical History: Past Medical History  Diagnosis Date  . Hypertension   . Diabetes mellitus without complication   . Arthritis   . Sciatica   . GERD (gastroesophageal reflux disease)   . Bladder cancer   . Urge incontinence   . Prostate cancer     S/P prostatectomy; Lung metastasis  . Vertigo   . Hyperlipidemia   . ED (erectile dysfunction)   . Gastric polyposis   . Osteoporosis   . Decreased libido   . Depression   . Hepatitis A   . CAD (coronary artery disease)   . Stroke   . History of blood transfusion     1946  . OA (osteoarthritis)     Medications:  Scheduled:  . antiseptic oral rinse  15 mL Mouth Rinse QID  . aspirin  81 mg Oral Daily  . chlorhexidine  15 mL Mouth/Throat BID  . [COMPLETED] Chlorhexidine Gluconate Cloth  6 each Topical Q0600  . [COMPLETED] coumadin book   Does not apply Once  . cycloSPORINE  1 drop Both Eyes BID  . enoxaparin (LOVENOX) injection  1 mg/kg  Subcutaneous Q24H  . feeding supplement  237 mL Oral TID BM  . insulin aspart  0-9 Units Subcutaneous TID WC  . levofloxacin (LEVAQUIN) IV  750 mg Intravenous Q48H  . metoprolol tartrate  12.5 mg Oral BID  . mupirocin ointment  1 application Nasal BID  . pantoprazole  40 mg Oral Q1200  . vancomycin  750 mg Intravenous Q24H  . [COMPLETED] warfarin  2.5 mg Oral ONCE-1800  . [COMPLETED] warfarin   Does not apply Once  . Warfarin - Pharmacist Dosing Inpatient   Does not apply q1800  . [DISCONTINUED] aspirin  325 mg Oral Daily    Assessment: 79 y/o M with new finding of LLE DVT involving the common femoral and proximal femoral veins. Per MD, to start lovenox bridging to warfarin 4/5. Pt also has MRSA PNA and Klebsiella UTI/Bacteremia. Scr improving at 1.25<1.37 with CrCl still <30. Baseline INR from 4/4 is 1.30. INR this AM is 1.27. CBC stable. Noted DDI with levaquin. MD lowering ASA dose to 81mg  from 325mg . Weight was inaccurate in the computer yesterday, so changing dose today. No bleeding noted.  Goal of Therapy:  INR 2-3 Monitor platelets  by anticoagulation protocol: Yes   Plan:  -Change Lovenox to 70mg  [place] q12h  -Warfarin 2.5mg  x 1 tonight at 1800 -Daily PT/INR -CBC minimum q72h -Monitor for bleeding  Abran Duke, PharmD Clinical Pharmacist Phone: [phone] Pager: [phone] 01/03/2013 7:36 AM

## 2013-01-03 NOTE — Progress Notes (Signed)
01/03/13 05:45- Patient c/o abdominal pain,when assessed,patient is pointing to bladder area.patient has a foley catheter draining cloudy urine.Bladder scan done,got 641 cc.M. Lynch,NP notified,order to flush foley cath. Which was done.Post flushing,foley drained 300cc more in addition to 450 cc emptied earlier. Tiarah Shisler Joselita,RN

## 2013-01-03 NOTE — Progress Notes (Addendum)
[facility] PROGRESS NOTE  Guy [name] ZOX:[id number] DOB: August 19, 1920 DOA: 12/29/2012 PCP: Terald Sleeper, MD  Assessment/Plan: Active Problems:   DM (diabetes mellitus)   Normocytic anemia   Malignant neoplasm of prostate   Acute encephalopathy   Aspiration pneumonia   Acute respiratory failure   Septic shock   Acute renal failure   Coagulation disorder      HPI :79 y/o M with PMH of HTN, DM, Prostate Cancer s/p prostatectomy with lung metastasis who as of late has been in SNF after kyphoplasty (10/2012) fo rehab efforts presented to [facility] ER on 4/1 with AMS and acute respiratory failure. Intubated in ER, PCCM called for ICU admit. Concern for CVA / aspiration   HOSPITAL COURSE:  #1 MRSA pneumonia  Patient treated with broad-spectrum antibiotics namely vancomycin, Azactam, levofloxacin  Intubated on 4/1, extubated on 4/2  Cultures obtained showed Klebsiella pneumonia bacteremia/UTI and MRSA in the sputum  The patient will continue with oral doxycycline 100 mg by mouth twice a day for 2 weeks   #2 Klebsiella pneumonia bacteremia/UTI  Patient will continue with levofloxacin 750 mg every 48 hours for another 2 weeks  The patient catheterizes intermittently,  He is being discharged on a Foley catheter which can be removed in one to 2 weeks after followup with urology  At the end of his 2 week course of levofloxacin the patient can resume his Macrodantin   #3 septic shock secondary to pneumonia and UTI  Elevated troponin 2.9- 1.99  INR was also elevated to 1.5 on upon admission  These parameters were consistent with septic shock and coagulopathy related to sepsis  2-D echo showed normal EF of 55% with grade 1 diastolic dysfunction  Patient did not require any vasopressors   #4 CK D. Baseline 1.1-1.3  Creatinine 1.7 upon admission  Patient was also found a lactic acidosis with lactic acid of 3.2, renal ultrasound did not show any hydronephrosis  HIS cOZAAR AND HIS  lASIX HAVE BEEN HELD  These can be resumed upon followup with his primary care provider in a week  BMP in a week   #5 anemia/thrombocytopenia  Thrombocytopenia likely from sepsis. Plt 79K  Hx of metastatic prostate cancer.  DSE panel abnormal with d-dimer of 3.67 upon admission, INR was also elevated  Doppler bilateral lower extremities were done to rule out DVT the results pending at this time  Heparin was avoided because of thrombocytopenia  Recommend repeat CBC weekly   #6 diabetes type 2  Patient was on prednisone at one point otherwise states that the prednisone was discontinued  He can resume his outpatient regimen  #7 DVT Started on Coumadin and Lovenox after discussing possible thrombocytopenia, fortunately the patient's platelet count is better today   HPI/Subjective: Feeling better no complaints  Objective: Filed Vitals:   01/02/13 0949 01/02/13 1839 01/02/13 2138 01/03/13 0531  BP: 160/82 166/94 153/81 183/80  Pulse: 77 81 59 80  Temp: 98.1 F (36.7 C) 98.6 F (37 C) 99.3 F (37.4 C) 98.4 F (36.9 C)  TempSrc: Oral  Oral Oral  Resp: 18 18 18 19   Height:   5\' 10"  (1.778 m)   Weight:   71.714 kg (158 lb 1.6 oz)   SpO2: 94% 94% 93% 92%    Intake/Output Summary (Last 24 hours) at 01/03/13 0858 Last data filed at 01/03/13 0700  Gross per 24 hour  Intake    380 ml  Output   1750 ml  Net  -1370 ml  Exam:  HENT:  Head: Atraumatic.  Nose: Nose normal.  Mouth/Throat: Oropharynx is clear and moist.  Eyes: Conjunctivae are normal. Pupils are equal, round, and reactive to light. No scleral icterus.  Neck: Neck supple. No tracheal deviation present.  Cardiovascular: Normal rate, regular rhythm, normal heart sounds and intact distal pulses.  Pulmonary/Chest: Effort normal and breath sounds normal. No respiratory distress.  Abdominal: Soft. Normal appearance and bowel sounds are normal. She exhibits no distension. There is no tenderness.  Musculoskeletal: She  exhibits no edema and no tenderness.  Neurological: She is alert. No cranial nerve deficit.    Data Reviewed: Basic Metabolic Panel:  Recent Labs Lab 12/29/12 1700 12/30/12 0500 12/31/12 0500 01/01/13 0540 01/02/13 0610  NA 139 138 140 139 140  K 3.6 3.8 3.5 3.1* 4.1  CL 108 107 110 108 110  CO2 21 20 19 22 24   GLUCOSE 131* 189* 143* 180* 213*  BUN 31* 38* 42* 37* 24*  CREATININE 1.80* 1.71* 1.56* 1.37* 1.25  CALCIUM 7.6* 7.4* 7.5* 7.5* 8.1*    Liver Function Tests:  Recent Labs Lab 12/29/12 1431 12/29/12 1700  AST 38* 44*  ALT 16 19  ALKPHOS 104 116  BILITOT 0.7 0.6  PROT 4.8* 5.2*  ALBUMIN 1.7* 1.8*   No results found for this basename: LIPASE, AMYLASE,  in the last 168 hours No results found for this basename: AMMONIA,  in the last 168 hours  CBC:  Recent Labs Lab 12/29/12 1431  12/30/12 0500 12/31/12 0500 01/01/13 0540 01/02/13 0610 01/03/13 0510  WBC 12.0*  < > 8.9 10.7* 10.6* 9.1 12.3*  NEUTROABS 10.8*  --   --  9.5* 9.1*  --   --   HGB 8.4*  < > 8.3* 9.1* 10.2* 10.8* 10.9*  HCT 24.5*  < > 24.2* 26.5* 29.4* 32.0* 32.0*  MCV 93.9  < > 93.1 93.0 92.7 93.3 92.2  PLT 71*  < > 70* 79* 87* 78* 102*  < > = values in this interval not displayed.  Cardiac Enzymes:  Recent Labs Lab 12/29/12 1700 12/30/12 0054 12/30/12 0632 12/30/12 0805 12/30/12 1214  TROPONINI 2.43* 2.91* 2.32* 2.46* 1.99*   BNP (last 3 results) No results found for this basename: PROBNP,  in the last 8760 hours   CBG:  Recent Labs Lab 01/01/13 1136 01/01/13 1651 01/01/13 2107 01/02/13 0747 01/02/13 1123  GLUCAP 240* 296* 236* 205* 218*    Recent Results (from the past 240 hour(s))  URINE CULTURE     Status: None   Collection Time    12/29/12  3:00 PM      Result Value Range Status   Specimen Description URINE, CATHETERIZED   Final   Special Requests NONE   Final   Culture  Setup Time 12/29/2012 16:01   Final   Colony Count >=100,000 COLONIES/ML   Final    Culture KLEBSIELLA PNEUMONIAE   Final   Report Status 12/31/2012 FINAL   Final   Organism ID, Bacteria KLEBSIELLA PNEUMONIAE   Final  CULTURE, BLOOD (ROUTINE X 2)     Status: None   Collection Time    12/29/12  4:45 PM      Result Value Range Status   Specimen Description BLOOD ARM RIGHT   Final   Special Requests BOTTLES DRAWN AEROBIC AND ANAEROBIC 10CC   Final   Culture  Setup Time 12/29/2012 23:40   Final   Culture     Final   Value: KLEBSIELLA PNEUMONIAE  Note: SUSCEPTIBILITIES PERFORMED ON PREVIOUS CULTURE WITHIN THE LAST 5 DAYS.     Note: Gram Stain Report Called to,Read Back By and Verified With: KELSEY WHITE@1056  ON [id number] BY [facility]   Report Status 01/01/2013 FINAL   Final  URINE CULTURE     Status: None   Collection Time    12/29/12  4:56 PM      Result Value Range Status   Specimen Description URINE, CATHETERIZED   Final   Special Requests NONE   Final   Culture  Setup Time 12/30/2012 03:01   Final   Colony Count >=100,000 COLONIES/ML   Final   Culture KLEBSIELLA PNEUMONIAE   Final   Report Status 01/01/2013 FINAL   Final   Organism ID, Bacteria KLEBSIELLA PNEUMONIAE   Final  CULTURE, BLOOD (ROUTINE X 2)     Status: None   Collection Time    12/29/12  5:00 PM      Result Value Range Status   Specimen Description BLOOD HAND RIGHT   Final   Special Requests BOTTLES DRAWN AEROBIC AND ANAEROBIC 10CC   Final   Culture  Setup Time 12/29/2012 23:40   Final   Culture     Final   Value: KLEBSIELLA PNEUMONIAE     Note: Gram Stain Report Called to,Read Back By and Verified With: CHRIS M@1425  ON [id number] BY [facility]   Report Status 01/02/2013 FINAL   Final   Organism ID, Bacteria KLEBSIELLA PNEUMONIAE   Final  MRSA PCR SCREENING     Status: Abnormal   Collection Time    12/29/12  8:18 PM      Result Value Range Status   MRSA by PCR POSITIVE (*) NEGATIVE Final   Comment:            The GeneXpert MRSA Assay (FDA     approved for NASAL specimens     only), is one component of a      comprehensive MRSA colonization     surveillance program. It is not     intended to diagnose MRSA     infection nor to guide or     monitor treatment for     MRSA infections.     RESULT CALLED TO, READ BACK BY AND VERIFIED WITHLearta Codding RN 2230 12/29/12 A BROWNING  CULTURE, RESPIRATORY (NON-EXPECTORATED)     Status: None   Collection Time    12/29/12 11:41 PM      Result Value Range Status   Specimen Description TRACHEAL ASPIRATE   Final   Special Requests NONE   Final   Gram Stain     Final   Value: RARE WBC PRESENT, PREDOMINANTLY PMN     RARE SQUAMOUS EPITHELIAL CELLS PRESENT     ABUNDANT GRAM POSITIVE COCCI     IN PAIRS   Culture     Final   Value: ABUNDANT METHICILLIN RESISTANT STAPHYLOCOCCUS AUREUS     Note: RIFAMPIN AND GENTAMICIN SHOULD NOT BE USED AS SINGLE DRUGS FOR TREATMENT OF STAPH INFECTIONS. This organism is presumed to be Clindamycin resistant based on detection of inducible Clindamycin resistance. CRITICAL RESULT CALLED TO, READ BACK BY AND      VERIFIED WITH: ASHLEY T@7 ;38AM ON 01/01/13 BY [facility]   Report Status 01/01/2013 FINAL   Final   Organism ID, Bacteria METHICILLIN RESISTANT STAPHYLOCOCCUS AUREUS   Final     Studies: Ct Head Wo Contrast  12/29/2012  *RADIOLOGY REPORT*  Clinical Data: Altered mental status  CT HEAD WITHOUT CONTRAST  Technique:  Contiguous axial images were obtained from the base of the skull through the vertex without contrast.  Comparison: CT 11/16/2012  Findings: Moderate atrophy.  Chronic microvascular ischemic changes in the white matter are stable from prior study.  No acute infarct. Negative for hemorrhage or mass.  No midline shift.  Calvarium is intact.  There is chronic sinusitis, most prominent in the right maxillary sinus.  No acute bony abnormality.  IMPRESSION: Atrophy and chronic microvascular ischemia.  No acute intracranial abnormality.  Chronic sinusitis.   Original Report Authenticated By: Janeece Riggers, M.D.    US Renal  Port  12/30/2012  *RADIOLOGY REPORT*  Clinical Data: Septic shock.  RENAL/URINARY TRACT ULTRASOUND COMPLETE  Comparison:  None.  Findings:  Right Kidney:  11.7 cm in length.  Diffuse increased echogenicity consistent with medical renal disease.  There is also mild renal cortical thinning.  83.8 cm upper pole cyst is noted.  No hydronephrosis.  Left Kidney:  10.0 cm in length.  Slight increased echogenicity and relatively normal renal cortical thickness.  A 1.6 cm upper pole cyst is noted.  No hydronephrosis.  Bladder:  Decompressed by Foley catheter.  IMPRESSION:  1.  Increased echogenicity of the right kidney suggesting medical renal disease. 2.  Bilateral cysts. 3.  No hydronephrosis.   Original Report Authenticated By: Rudie Meyer, M.D.    Dg Chest Port 1 View  12/30/2012  *RADIOLOGY REPORT*  Clinical Data: Follow up pneumonia  PORTABLE CHEST - 1 VIEW  Comparison: 12/29/2012  Findings: Endotracheal tube remains in good position.  Central venous catheter tip remains in the SVC.  NG tube has been placed since yesterday.  Improvement in superior mediastinal widening.  This may have been due to vascular congestion.  Diffuse bilateral airspace disease shows some improvement and this may be edema.  There are increasing pleural effusions and increasing bibasilar atelectasis.  IMPRESSION: Prominent superior mediastinum shows improvement.  This may be due to improving vascular congestion and edema.  Increasing bilateral pleural effusions and bibasilar atelectasis.   Original Report Authenticated By: Janeece Riggers, M.D.    Dg Chest Portable 1 View  12/29/2012  *RADIOLOGY REPORT*  Clinical Data: Central line placement.  PORTABLE CHEST - 1 VIEW  Comparison: 12/29/2012 1:17 p.m.  Findings: Endotracheal tube tip 2.3 cm above the carina.  Left central line has been placed and the tip is at the level of the distal superior vena cava.  No gross pneumothorax.  Mediastinum appears prominent.  There is may be related to the right  upper lobe atelectasis/consolidation.  Primary mediastinal abnormality not excluded.  Pulmonary vascular congestion most notable centrally.  Cardiomegaly.  IMPRESSION: Left central line has been placed and the tip is at the level of the distal superior vena cava.  No gross pneumothorax.  Mediastinum appears prominent.  There is may be related to the right upper lobe atelectasis/consolidation.  Primary mediastinal abnormality not excluded.  Pulmonary vascular congestion most notable centrally.  Cardiomegaly.  This is a call report.   Original Report Authenticated By: Lacy Duverney, M.D.    Dg Chest Portable 1 View  12/29/2012  *RADIOLOGY REPORT*  Clinical Data: Altered mental status, ETT adjustment  PORTABLE CHEST - 1 VIEW  Comparison: None.  Findings: Endotracheal tube terminates 3 cm above the carina.  Patchy right upper lobe opacity, suspicious for pneumonia.  Patchy bibasilar opacities, likely atelectasis.  Patchy/nodular opacity in the lateral left mid lung, suspicious for neoplasm on prior CT. No  pleural effusion or pneumothorax.  Mild cardiomegaly.  IMPRESSION: Endotracheal tube terminates 3 cm above the carina.  Otherwise unchanged.   Original Report Authenticated By: Charline Bills, M.D.    Dg Chest Portable 1 View  12/29/2012  *RADIOLOGY REPORT*  Clinical Data: Altered mental status, ETT placement  PORTABLE CHEST - 1 VIEW  Comparison: CT chest dated 11/14/2012  Findings: Endotracheal tube terminates 1 cm above the carina.  Mild patchy right upper lobe opacity, suspicious for pneumonia. Patchy bibasilar opacities, possibly atelectasis.  Nodular opacity in the left mid lung, corresponding to possible malignancy on prior CT.  No pleural effusion or pneumothorax.  Mild cardiomegaly.  IMPRESSION: Endotracheal tube terminates 1 cm above the carina.  Consider withdrawal approximately 2 cm.  Mild patchy right upper lobe opacity, suspicious for pneumonia.  Nodular opacity in the left mid lung, corresponding to  possible malignancy on prior CT.   Original Report Authenticated By: Charline Bills, M.D.     Scheduled Meds: . antiseptic oral rinse  15 mL Mouth Rinse QID  . aspirin  81 mg Oral Daily  . chlorhexidine  15 mL Mouth/Throat BID  . cycloSPORINE  1 drop Both Eyes BID  . enoxaparin (LOVENOX) injection  1 mg/kg Subcutaneous Q12H  . feeding supplement  237 mL Oral TID BM  . insulin aspart  0-9 Units Subcutaneous TID WC  . levofloxacin (LEVAQUIN) IV  750 mg Intravenous Q48H  . metoprolol tartrate  12.5 mg Oral BID  . mupirocin ointment  1 application Nasal BID  . pantoprazole  40 mg Oral Q1200  . vancomycin  750 mg Intravenous Q24H  . warfarin  2.5 mg Oral ONCE-1800  . Warfarin - Pharmacist Dosing Inpatient   Does not apply q1800   Continuous Infusions:   Active Problems:   DM (diabetes mellitus)   Normocytic anemia   Malignant neoplasm of prostate   Acute encephalopathy   Aspiration pneumonia   Acute respiratory failure   Septic shock   Acute renal failure   Coagulation disorder    Time spent: 40 minutes   [facility]  [facility] Pager [phone]. If 8PM-8AM, please contact night-coverage at [email or web address], password [facility] 01/03/2013, 8:58 AM  LOS: 5 days

## 2013-01-03 NOTE — Plan of Care (Signed)
Problem: Consults Goal: Diagnosis - Venous Thromboembolism (VTE) Choose a selection  Outcome: Completed/Met Date Met:  01/03/13 DVT (Deep Vein Thrombosis)

## 2013-01-04 LAB — COMPREHENSIVE METABOLIC PANEL
BUN: 15 mg/dL (ref 6–23)
CO2: 25 mEq/L (ref 19–32)
Chloride: 100 mEq/L (ref 96–112)
Creatinine, Ser: 1.12 mg/dL (ref 0.50–1.35)
GFR calc non Af Amer: 56 mL/min — ABNORMAL LOW (ref >90)
Total Bilirubin: 0.6 mg/dL (ref 0.3–1.2)

## 2013-01-04 LAB — CBC
HCT: 31.8 % — ABNORMAL LOW (ref 39.0–52.0)
MCH: 32.2 pg (ref 26.0–34.0)
MCHC: 34.6 g/dL (ref 30.0–36.0)
MCV: 93 fL (ref 78.0–100.0)
RDW: 13.8 % (ref 11.5–15.5)

## 2013-01-04 LAB — GLUCOSE, CAPILLARY
Glucose-Capillary: 275 mg/dL — ABNORMAL HIGH (ref 70–99)
Glucose-Capillary: 238 mg/dL — ABNORMAL HIGH (ref 70–99)

## 2013-01-04 MED ORDER — ENOXAPARIN SODIUM 80 MG/0.8ML ~~LOC~~ SOLN: 1.0000 mg/kg | Freq: Two times a day (BID) | SUBCUTANEOUS | Status: DC

## 2013-01-04 MED ORDER — WARFARIN SODIUM 2.5 MG PO TABS: 2.5000 mg | ORAL_TABLET | Freq: Every day | ORAL | Status: DC

## 2013-01-04 MED ORDER — ASPIRIN 81 MG PO CHEW: 81.0000 mg | CHEWABLE_TABLET | Freq: Every day | ORAL | Status: DC

## 2013-01-04 MED ORDER — WARFARIN SODIUM 5 MG PO TABS: 5.0000 mg | ORAL_TABLET | ORAL | Status: DC | PRN

## 2013-01-04 NOTE — Progress Notes (Signed)
Patient discharged to [facility]. Report called to Leotis Shames, RN at [facility]. Patient AVS reviewed with Leotis Shames, RN and was sent to [facility] with patient at time of discharge.  Also spoke with patient's wife earlier today regarding need for patient to follow-up with Urology on an outpatient basis. Wife verbalized understanding. Urology MD to contact [facility] to set up outpt appointment.  Patient discharged with foley catheter. [facility] staff aware to monitor catheter for hematuria and to flush daily as noted in AVS.  Patient remains stable; no signs or symptoms of distress.  Patient transported via EMS to [facility].

## 2013-01-04 NOTE — Progress Notes (Signed)
ANTICOAGULATION CONSULT NOTE - Follow Up Consult  Pharmacy Consult for Lovenox + Coumadin Indication: LLE DVT  Allergies  Allergen Reactions  . Albuterol Sulfate Hfa (JXB:[id number]) Shortness Of Breath  . Adhesive (Tape) Other (See Comments)    REACTION: SKIN BLISTERS  . Penicillins Other (See Comments)    REACTION: ITCHING HANDS  . Sulfa Drugs Cross Reactors Hives    Patient Measurements: Height: 5\' 10"  (177.8 cm) Weight: 158 lb 1.6 oz (71.714 kg) IBW/kg (Calculated) : 73  Vital Signs: Temp: 99 F (37.2 C) (04/07 0418) Temp src: Oral (04/07 0418) BP: 124/73 mmHg (04/07 0418) Pulse Rate: 62 (04/07 0418)  Labs:  Recent Labs  01/02/13 0610 01/02/13 1758 01/03/13 0510 01/04/13 0630  HGB 10.8*  --  10.9* 11.0*  HCT 32.0*  --  32.0* 31.8*  PLT 78*  --  102* 125*  LABPROT  --  16.0* 15.6* 16.3*  INR  --  1.31 1.27 1.34  CREATININE 1.25  --   --   --     Estimated Creatinine Clearance: 39.8 ml/min (by C-G formula based on Cr of 1.25).  Assessment: 89yom continues on day #3/5 lovenox to coumadin bridge for new LLE DVT (4/5). INR remains subtherapeutic today at 1.34 after 2 doses of 2.5mg . Will increase dose today. Watch for drug interaction with levaquin and doxycycline. Lovenox dose remains appropriate. Hgb/Hct/Plts improving. No bleeding reported.  Goal of Therapy:  INR 2-3 Monitor platelets by anticoagulation protocol: Yes   Plan:  1) Increase coumadin to 5mg  x 1 today prior to discharge then can continue 2.5mg  daily 2) Continue lovenox 70mg  q12 for at least 5 days  Fredrik Rigger 01/04/2013,8:36 AM

## 2013-01-04 NOTE — Clinical Social Work Note (Signed)
Patient is being discharged back to [facility] skilled nursing facility today. Discharge information forwarded to facility. Patient's medical packet compiled and will accompany him to facility via ambulance.  Genelle Bal, MSW, LCSW [phone]

## 2013-01-04 NOTE — Discharge Summary (Addendum)
Physician Discharge Summary  CORNELLIUS KROPP MRN: [id number] DOB/AGE: 01/04/1920 79 y.o.  PCP: Terald Sleeper, MD   Admit date: 12/29/2012 Discharge date: 01/04/2013  Discharge Diagnoses:  Bilateral DVT   Guy [name] (diabetes mellitus)   Normocytic anemia   Malignant neoplasm of Guy   Acute encephalopathy   Aspiration pneumonia   Acute respiratory failure   Septic shock   Acute renal failure   Coagulation disorder Thrombocytopenia improving      Medication List    STOP taking these medications       furosemide 20 MG tablet  Commonly known as:  LASIX     losartan 50 MG tablet  Commonly known as:  COZAAR     naproxen sodium 220 MG tablet  Commonly known as:  ANAPROX     predniSONE 20 MG tablet  Commonly known as:  DELTASONE      TAKE these medications       acetaminophen 325 MG tablet  Commonly known as:  TYLENOL  Take 325 mg by mouth daily.     aspirin 81 MG chewable tablet  Chew 1 tablet (81 mg total) by mouth daily.     bicalutamide 50 MG tablet  Commonly known as:  CASODEX  Take 50 mg by mouth daily.     bisacodyl 10 MG suppository  Commonly known as:  DULCOLAX  Place 10 mg rectally as needed for constipation (for constipation not relieved by MOM).     CALCIUM 600+D HIGH POTENCY 600-400 MG-UNIT per tablet  Generic drug:  Calcium Carbonate-Vitamin D  Take 1 tablet by mouth 2 (two) times daily.     docusate sodium 100 MG capsule  Commonly known as:  COLACE  Take 100 mg by mouth 2 (two) times daily.     doxycycline 50 MG capsule  Commonly known as:  VIBRAMYCIN  Take 2 capsules (100 mg total) by mouth 2 (two) times daily.     enoxaparin 80 MG/0.8ML injection  Commonly known as:  LOVENOX  Inject 0.7 mLs (70 mg total) into the skin every 12 (twelve) hours.     gabapentin 100 MG capsule  Commonly known as:  NEURONTIN  Take 100 mg by mouth 3 (three) times daily.     glimepiride 4 MG tablet  Commonly known as:  AMARYL  Take 4 mg by mouth  daily before breakfast.     insulin aspart 100 UNIT/ML injection  Commonly known as:  novoLOG  Inject 8 Units into the skin once.     insulin glargine 100 UNIT/ML injection  Commonly known as:  LANTUS  Inject 14 Units into the skin at bedtime.     levofloxacin 750 MG tablet  Commonly known as:  LEVAQUIN  Take 1 tablet (750 mg total) by mouth daily.     loratadine 10 MG tablet  Commonly known as:  CLARITIN  Take 10 mg by mouth daily as needed for allergies.     magnesium hydroxide 400 MG/5ML suspension  Commonly known as:  MILK OF MAGNESIA  Take 30 mLs by mouth daily as needed for constipation.     meclizine 25 MG tablet  Commonly known as:  ANTIVERT  Take 25 mg by mouth 3 (three) times daily as needed for dizziness.     metoprolol 50 MG tablet  Commonly known as:  LOPRESSOR  Take 25 mg by mouth 2 (two) times daily.     multivitamins ther. w/minerals Tabs  Take 1 tablet by mouth every morning.  nitroGLYCERIN 0.4 MG SL tablet  Commonly known as:  NITROSTAT  Place 0.4 mg under the tongue every 5 (five) minutes as needed for chest pain.     omeprazole 20 MG capsule  Commonly known as:  PRILOSEC  Take 20 mg by mouth 2 (two) times daily.     oxybutynin 5 MG tablet  Commonly known as:  DITROPAN  Take 5 mg by mouth at bedtime.     pantoprazole 40 MG tablet  Commonly known as:  PROTONIX  Take 1 tablet (40 mg total) by mouth daily at 12 noon.     polyethylene glycol packet  Commonly known as:  MIRALAX / GLYCOLAX  Take 17 g by mouth daily.     potassium chloride 10 MEQ tablet  Commonly known as:  K-DUR  Take 1 tablet (10 mEq total) by mouth daily.     RESTASIS 0.05 % ophthalmic emulsion  Generic drug:  cycloSPORINE  Place 1 drop into both eyes 2 (two) times daily.     sertraline 100 MG tablet  Commonly known as:  ZOLOFT  Take 200 mg by mouth at bedtime.     simvastatin 40 MG tablet  Commonly known as:  ZOCOR  Take 40 mg by mouth at bedtime.     sodium  phosphate enema  Commonly known as:  FLEET  Place 1 enema rectally once as needed (for constipation not relieved by MOM or Bisacodyl Supp.). follow package directions     traMADol 50 MG tablet  Commonly known as:  ULTRAM  Take 25 mg by mouth every 8 (eight) hours.        Discharge Condition: Stable  Disposition: 03-Skilled Nursing Facility   Consults:  Critical care    Significant Diagnostic Studies: Ct Head Wo Contrast  12/29/2012  *RADIOLOGY REPORT*  Clinical Data: Altered mental status  CT HEAD WITHOUT CONTRAST  Technique:  Contiguous axial images were obtained from the base of the skull through the vertex without contrast.  Comparison: CT 11/16/2012  Findings: Moderate atrophy.  Chronic microvascular ischemic changes in the white matter are stable from prior study.  No acute infarct. Negative for hemorrhage or mass.  No midline shift.  Calvarium is intact.  There is chronic sinusitis, most prominent in the right maxillary sinus.  No acute bony abnormality.  IMPRESSION: Atrophy and chronic microvascular ischemia.  No acute intracranial abnormality.  Chronic sinusitis.   Original Report Authenticated By: Janeece Riggers, M.D.    US Renal Port  12/30/2012  *RADIOLOGY REPORT*  Clinical Data: Septic shock.  RENAL/URINARY TRACT ULTRASOUND COMPLETE  Comparison:  None.  Findings:  Right Kidney:  11.7 cm in length.  Diffuse increased echogenicity consistent with medical renal disease.  There is also mild renal cortical thinning.  83.8 cm upper pole cyst is noted.  No hydronephrosis.  Left Kidney:  10.0 cm in length.  Slight increased echogenicity and relatively normal renal cortical thickness.  A 1.6 cm upper pole cyst is noted.  No hydronephrosis.  Bladder:  Decompressed by Foley catheter.  IMPRESSION:  1.  Increased echogenicity of the right kidney suggesting medical renal disease. 2.  Bilateral cysts. 3.  No hydronephrosis.   Original Report Authenticated By: Rudie Meyer, M.D.    Dg Chest Port 1  View  12/30/2012  *RADIOLOGY REPORT*  Clinical Data: Follow up pneumonia  PORTABLE CHEST - 1 VIEW  Comparison: 12/29/2012  Findings: Endotracheal tube remains in good position.  Central venous catheter tip remains in the SVC.  NG  tube has been placed since yesterday.  Improvement in superior mediastinal widening.  This may have been due to vascular congestion.  Diffuse bilateral airspace disease shows some improvement and this may be edema.  There are increasing pleural effusions and increasing bibasilar atelectasis.  IMPRESSION: Prominent superior mediastinum shows improvement.  This may be due to improving vascular congestion and edema.  Increasing bilateral pleural effusions and bibasilar atelectasis.   Original Report Authenticated By: Janeece Riggers, M.D.    Dg Chest Portable 1 View  12/29/2012  *RADIOLOGY REPORT*  Clinical Data: Central line placement.  PORTABLE CHEST - 1 VIEW  Comparison: 12/29/2012 1:17 p.m.  Findings: Endotracheal tube tip 2.3 cm above the carina.  Left central line has been placed and the tip is at the level of the distal superior vena cava.  No gross pneumothorax.  Mediastinum appears prominent.  There is may be related to the right upper lobe atelectasis/consolidation.  Primary mediastinal abnormality not excluded.  Pulmonary vascular congestion most notable centrally.  Cardiomegaly.  IMPRESSION: Left central line has been placed and the tip is at the level of the distal superior vena cava.  No gross pneumothorax.  Mediastinum appears prominent.  There is may be related to the right upper lobe atelectasis/consolidation.  Primary mediastinal abnormality not excluded.  Pulmonary vascular congestion most notable centrally.  Cardiomegaly.  This is a call report.   Original Report Authenticated By: Lacy Duverney, M.D.    Dg Chest Portable 1 View  12/29/2012  *RADIOLOGY REPORT*  Clinical Data: Altered mental status, ETT adjustment  PORTABLE CHEST - 1 VIEW  Comparison: None.  Findings:  Endotracheal tube terminates 3 cm above the carina.  Patchy right upper lobe opacity, suspicious for pneumonia.  Patchy bibasilar opacities, likely atelectasis.  Patchy/nodular opacity in the lateral left mid lung, suspicious for neoplasm on prior CT. No pleural effusion or pneumothorax.  Mild cardiomegaly.  IMPRESSION: Endotracheal tube terminates 3 cm above the carina.  Otherwise unchanged.   Original Report Authenticated By: Charline Bills, M.D.    Dg Chest Portable 1 View  12/29/2012  *RADIOLOGY REPORT*  Clinical Data: Altered mental status, ETT placement  PORTABLE CHEST - 1 VIEW  Comparison: CT chest dated 11/14/2012  Findings: Endotracheal tube terminates 1 cm above the carina.  Mild patchy right upper lobe opacity, suspicious for pneumonia. Patchy bibasilar opacities, possibly atelectasis.  Nodular opacity in the left mid lung, corresponding to possible malignancy on prior CT.  No pleural effusion or pneumothorax.  Mild cardiomegaly.  IMPRESSION: Endotracheal tube terminates 1 cm above the carina.  Consider withdrawal approximately 2 cm.  Mild patchy right upper lobe opacity, suspicious for pneumonia.  Nodular opacity in the left mid lung, corresponding to possible malignancy on prior CT.   Original Report Authenticated By: Charline Bills, M.D.        Microbiology: Recent Results (from the past 240 hour(s))  URINE CULTURE     Status: None   Collection Time    12/29/12  3:00 PM      Result Value Range Status   Specimen Description URINE, CATHETERIZED   Final   Special Requests NONE   Final   Culture  Setup Time 12/29/2012 16:01   Final   Colony Count >=100,000 COLONIES/ML   Final   Culture KLEBSIELLA PNEUMONIAE   Final   Report Status 12/31/2012 FINAL   Final   Organism ID, Bacteria KLEBSIELLA PNEUMONIAE   Final  CULTURE, BLOOD (ROUTINE X 2)     Status: None  Collection Time    12/29/12  4:45 PM      Result Value Range Status   Specimen Description BLOOD ARM RIGHT   Final    Special Requests BOTTLES DRAWN AEROBIC AND ANAEROBIC 10CC   Final   Culture  Setup Time 12/29/2012 23:40   Final   Culture     Final   Value: KLEBSIELLA PNEUMONIAE     Note: SUSCEPTIBILITIES PERFORMED ON PREVIOUS CULTURE WITHIN THE LAST 5 DAYS.     Note: Gram Stain Report Called to,Read Back By and Verified With: KELSEY WHITE@1056  ON [id number] BY [facility]   Report Status 01/01/2013 FINAL   Final  URINE CULTURE     Status: None   Collection Time    12/29/12  4:56 PM      Result Value Range Status   Specimen Description URINE, CATHETERIZED   Final   Special Requests NONE   Final   Culture  Setup Time 12/30/2012 03:01   Final   Colony Count >=100,000 COLONIES/ML   Final   Culture KLEBSIELLA PNEUMONIAE   Final   Report Status 01/01/2013 FINAL   Final   Organism ID, Bacteria KLEBSIELLA PNEUMONIAE   Final  CULTURE, BLOOD (ROUTINE X 2)     Status: None   Collection Time    12/29/12  5:00 PM      Result Value Range Status   Specimen Description BLOOD HAND RIGHT   Final   Special Requests BOTTLES DRAWN AEROBIC AND ANAEROBIC 10CC   Final   Culture  Setup Time 12/29/2012 23:40   Final   Culture     Final   Value: KLEBSIELLA PNEUMONIAE     Note: Gram Stain Report Called to,Read Back By and Verified With: CHRIS M@1425  ON [id number] BY [facility]   Report Status 01/02/2013 FINAL   Final   Organism ID, Bacteria KLEBSIELLA PNEUMONIAE   Final  MRSA PCR SCREENING     Status: Abnormal   Collection Time    12/29/12  8:18 PM      Result Value Range Status   MRSA by PCR POSITIVE (*) NEGATIVE Final   Comment:            The GeneXpert MRSA Assay (FDA     approved for NASAL specimens     only), is one component of a     comprehensive MRSA colonization     surveillance program. It is not     intended to diagnose MRSA     infection nor to guide or     monitor treatment for     MRSA infections.     RESULT CALLED TO, READ BACK BY AND VERIFIED WITHLearta Codding RN 2230 12/29/12 A BROWNING  CULTURE, RESPIRATORY  (NON-EXPECTORATED)     Status: None   Collection Time    12/29/12 11:41 PM      Result Value Range Status   Specimen Description TRACHEAL ASPIRATE   Final   Special Requests NONE   Final   Gram Stain     Final   Value: RARE WBC PRESENT, PREDOMINANTLY PMN     RARE SQUAMOUS EPITHELIAL CELLS PRESENT     ABUNDANT GRAM POSITIVE COCCI     IN PAIRS   Culture     Final   Value: ABUNDANT METHICILLIN RESISTANT STAPHYLOCOCCUS AUREUS     Note: RIFAMPIN AND GENTAMICIN SHOULD NOT BE USED AS SINGLE DRUGS FOR TREATMENT OF STAPH INFECTIONS. This organism is presumed to be Clindamycin resistant  based on detection of inducible Clindamycin resistance. CRITICAL RESULT CALLED TO, READ BACK BY AND      VERIFIED WITH: ASHLEY T@7 ;38AM ON 01/01/13 BY [facility]   Report Status 01/01/2013 FINAL   Final   Organism ID, Bacteria METHICILLIN RESISTANT STAPHYLOCOCCUS AUREUS   Final     Labs: Results for orders placed during the hospital encounter of 12/29/12 (from the past 48 hour(s))  GLUCOSE, CAPILLARY     Status: Abnormal   Collection Time    01/02/13 11:23 AM      Result Value Range   Glucose-Capillary 218 (*) 70 - 99 mg/dL   Comment 1 Documented in Chart    GLUCOSE, CAPILLARY     Status: Abnormal   Collection Time    01/02/13  5:05 PM      Result Value Range   Glucose-Capillary 226 (*) 70 - 99 mg/dL   Comment 1 Notify RN     Comment 2 Documented in Chart    PROTIME-INR     Status: Abnormal   Collection Time    01/02/13  5:58 PM      Result Value Range   Prothrombin Time 16.0 (*) 11.6 - 15.2 seconds   INR 1.31  0.00 - 1.49  GLUCOSE, CAPILLARY     Status: Abnormal   Collection Time    01/02/13  9:31 PM      Result Value Range   Glucose-Capillary 180 (*) 70 - 99 mg/dL   Comment 1 Documented in Chart     Comment 2 Notify RN    PROTIME-INR     Status: Abnormal   Collection Time    01/03/13  5:10 AM      Result Value Range   Prothrombin Time 15.6 (*) 11.6 - 15.2 seconds   INR 1.27  0.00 - 1.49  CBC      Status: Abnormal   Collection Time    01/03/13  5:10 AM      Result Value Range   WBC 12.3 (*) 4.0 - 10.5 K/uL   RBC 3.47 (*) 4.22 - 5.81 MIL/uL   Hemoglobin 10.9 (*) 13.0 - 17.0 g/dL   HCT 16.1 (*) 09.6 - 04.5 %   MCV 92.2  78.0 - 100.0 fL   MCH 31.4  26.0 - 34.0 pg   MCHC 34.1  30.0 - 36.0 g/dL   RDW 40.9  81.1 - 91.4 %   Platelets 102 (*) 150 - 400 K/uL   Comment: CONSISTENT WITH PREVIOUS RESULT  GLUCOSE, CAPILLARY     Status: Abnormal   Collection Time    01/03/13  7:45 AM      Result Value Range   Glucose-Capillary 186 (*) 70 - 99 mg/dL  GLUCOSE, CAPILLARY     Status: Abnormal   Collection Time    01/03/13 11:35 AM      Result Value Range   Glucose-Capillary 287 (*) 70 - 99 mg/dL  GLUCOSE, CAPILLARY     Status: Abnormal   Collection Time    01/03/13  4:26 PM      Result Value Range   Glucose-Capillary 193 (*) 70 - 99 mg/dL  GLUCOSE, CAPILLARY     Status: Abnormal   Collection Time    01/03/13  8:35 PM      Result Value Range   Glucose-Capillary 230 (*) 70 - 99 mg/dL  PROTIME-INR     Status: Abnormal   Collection Time    01/04/13  6:30 AM      Result  Value Range   Prothrombin Time 16.3 (*) 11.6 - 15.2 seconds   INR 1.34  0.00 - 1.49  CBC     Status: Abnormal   Collection Time    01/04/13  6:30 AM      Result Value Range   WBC 9.9  4.0 - 10.5 K/uL   RBC 3.42 (*) 4.22 - 5.81 MIL/uL   Hemoglobin 11.0 (*) 13.0 - 17.0 g/dL   HCT 09.8 (*) 11.9 - 14.7 %   MCV 93.0  78.0 - 100.0 fL   MCH 32.2  26.0 - 34.0 pg   MCHC 34.6  30.0 - 36.0 g/dL   RDW 82.9  56.2 - 13.0 %   Platelets 125 (*) 150 - 400 K/uL    Guy [name], Guy [name], Guy Cancer s/p prostatectomy with lung metastasis who as of late has been in SNF after kyphoplasty (10/2012) fo rehab efforts presented to [facility] ER on 4/1 with AMS and acute respiratory failure. Intubated in ER, PCCM called for ICU admit. Concern for CVA / aspiration    HOSPITAL COURSE:  #1 MRSA pneumonia  Patient treated with  broad-spectrum antibiotics namely vancomycin, Azactam, levofloxacin  Intubated on 4/1, extubated on 4/2  Cultures obtained showed Klebsiella pneumonia bacteremia/UTI and MRSA in the sputum  The patient will continue with oral doxycycline 100 mg by mouth twice a day for 2 weeks    #2 Klebsiella pneumonia bacteremia/UTI  Patient will continue with levofloxacin 750 mg every 48 hours for another 2 weeks  The patient catheterizes intermittently,  He is being discharged on a Foley catheter which can be removed in one to 2 weeks after followup with urology  At the end of his 2 week course of levofloxacin the patient can resume his Macrodantin    #3 septic shock secondary to pneumonia and UTI  Elevated troponin 2.9- 1.99  INR was also elevated to 1.5 on upon admission  These parameters were consistent with septic shock and coagulopathy related to sepsis  2-D echo showed normal EF of 55% with grade 1 diastolic dysfunction  Patient did not require any vasopressors    #4 CK D. Baseline 1.1-1.3  Creatinine 1.7 upon admission  Patient was also found a lactic acidosis with lactic acid of 3.2, renal ultrasound did not show any hydronephrosis  HIS cOZAAR AND HIS lASIX HAVE BEEN HELD  These can be resumed upon followup with his primary care provider in a week  BMP in a week    #5 anemia/thrombocytopenia  Thrombocytopenia likely from sepsis. Plt 125 Hx of metastatic Guy cancer.  DIC panel abnormal with d-dimer of 3.67 upon admission, INR was also elevated  Doppler bilateral lower extremities were done to rule out DVT the results pending at this time  Heparin was avoided because of thrombocytopenia  The patient's platelet count has been stable despite being on Lovenox The patient also been started on Coumadin Recommend repeat CBC weekly    #6 diabetes type 2  Patient was on prednisone at one point otherwise states that the prednisone was discontinued  He can resume his outpatient regimen     #7 DVT  Started on Coumadin and Lovenox after discussing possible thrombocytopenia, fortunately the patient's platelet count is better today  Continue Lovenox until INR greater than 2.0 for  2 consecutive days, then discontinue Received Coumadin 2.5 mg last night, INR still subtherapeutic Continue daily INR checks   #8 mild hematuria This is probably secondary to anticoagulation  with Lovenox Foley catheter needs to be flushed 4 times a day If the patient develops frank hematuria, he may need to return to the ED Anticipated this would be self-limiting Discussed with Dr Warnell Bureau , urology, he has reassured me that patient can DC and he will arrange for outpt follow up in the office, per my request he spoke to pt's wife before DC    he is being discharged with an indwelling Foley    Discharge Exam: * Blood pressure 124/73, pulse 62, temperature 99 F (37.2 C), temperature source Oral, resp. rate 18, height 5\' 10"  (1.778 m), weight 71.714 kg (158 lb 1.6 oz), SpO2 97.00%.  Cardiovascular: Normal rate, regular rhythm, normal heart sounds and intact distal pulses.  Pulmonary/Chest: Effort normal and breath sounds normal. No respiratory distress.  Abdominal: Soft. Normal appearance and bowel sounds are normal. She exhibits no distension. There is no tenderness.  Musculoskeletal: She exhibits no edema and no tenderness.  Neurological: She is alert. No cranial nerve deficit        Discharge Orders   Future Orders Complete By Expires     Diet - low sodium heart healthy  As directed     Increase activity slowly  As directed          Signed: Anival Pasha 01/04/2013, 8:28 AM

## 2013-01-08 ENCOUNTER — Non-Acute Institutional Stay (SKILLED_NURSING_FACILITY): Payer: Medicare Other | Admitting: Internal Medicine

## 2013-01-08 DIAGNOSIS — A4902 Methicillin resistant Staphylococcus aureus infection, unspecified site: Secondary | ICD-10-CM

## 2013-01-08 DIAGNOSIS — C61 Malignant neoplasm of prostate: Secondary | ICD-10-CM

## 2013-01-08 DIAGNOSIS — A419 Sepsis, unspecified organism: Secondary | ICD-10-CM

## 2013-01-08 DIAGNOSIS — I82403 Acute embolism and thrombosis of unspecified deep veins of lower extremity, bilateral: Secondary | ICD-10-CM

## 2013-01-08 DIAGNOSIS — I82409 Acute embolism and thrombosis of unspecified deep veins of unspecified lower extremity: Secondary | ICD-10-CM

## 2013-01-08 NOTE — Progress Notes (Signed)
Patient ID: Guy [name], male   DOB: [birth year], 79 y.o.   MRN: [id number] Chief complaint; readmission to the facility status post [place] 12/29/2012 through 01/04/2013  History; this is a 79 year old man initially admitted to the facility on 11/27/2012. At that point he presented to the hospital delirious which was felt to be multifactorial secondary largely to narcotic medication, mild hyponatremia. He had a compression fracture of L3 status post kyphoplasty. 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 He is followed by urology. At times in the past he is on in and out catheterizations, currently has an indwelling Foley cath.   On this occasion the patient presented the hospital on April 1 with altered mental status and acute respiratory failure. He required intubation in the ER. Ultimately he was diagnosed with MRSA pneumonia. He was intubated on April 1 and extubated on April 2. Cultures obtained showed Klebsiella pneumonia bacteremia and UTI, as well as MRSA in the sputum. He was treated with appropriate antibiotic therapy and has been discharged on doxycycline 100 mg twice a day for 2 weeks. With regards to the Klebsiella pneumonia bacteremia/UTI was treated with Levaquin 750 every 48 for another 2 weeks if her the end of 2 weeks of Levaquin the patient can resume his prophylactic Macrodantin . He was also diagnosed with bilateral DVTs and was on Lovenox transitioned to Coumadin. He had thrombocytopenia however I don't currently see that nadir of this. I don't believe this is felt to be heparin-induced. In any case his INR today was 3.2 his Coumadin has been readjusted and his Lovenox has been stopped. He was worked up for renal insufficiency with a creatinine of 1.7. Cozaar and Lasix have been held. Renal ultrasound was negative for hydronephrosis.  Past medical history/problem; #1 respiratory failure secondary to sepsis, MRSA pneumonia, Klebsiella bacteremia and  pyelonephritis #2 bilateral DVTs discharge from Lovenox to Coumadin. Lovenox was discontinued as of today Coumadin continuing at 2 mg with a followup INR in April 14. #3 widely metastatic prostate cancer on Casodex followed by urology #4 history of L3 kyphoplasty #5 acute renal insufficiency Lasix and Cozaar are on hold #6 thrombocytopenia felt to be secondary to sepsis on this admission followup CBC is recommended #7 at one point was on prednisone I think secondary to acute low back pain and I'm not sure when this was stopped however he is not on this currently.  #8 urinary retention currently with a Foley catheter in place following with urology  Medications; list is reviewed as per nursing home MAR. Notable for doxycycline 100 mg twice a day for 2 weeks Levaquin 750 every 48 for 2 weeks. In terms of his diabetes he is on Lantus 14 units at bedtime, Amaryl 4 mg before breakfast. He remains on Ditropan even though he has chronic Foley catheter in place, I will await further instructions from neurology. As noted Lasix and ARB are on hold  Socially; patient's daughter is a long-standing resident this facility previously lived in [place]. He is a no code.   Family history; not related by the patient  Review of systems; Respiratory occasional cough, no shortness of breath Cardiac no chest pain lower extremity edema noted Abdomen no pain no diarrhea GU Foley catheter in place. Hematuria noted in the catheter Musculoskeletal complains of significant neck pain.  Physical exam; Respiratory-air entry is reduced bilaterally but no crackles or wheezes, or reading is normal no accessory muscle use Cardiac-heart sounds normal no murmurs no signs of  heart failure Abdomen slightly distended but no tenderness no masses no liver no spleen GU Foley catheter in place Extremities there is edema posteriorly in the right leg to the mid thigh, mild edema bilaterally. He had bilateral DVTs there is no evidence  of this however some degree of compression stocking is indicated. Musculoskeletal; most remarkable for very significant tenderness at for C5 area I will check his most recent bone scan. This is clearly a bony source. Limited C-spine raise range of motion Mental status bright alert conversational Neurologic I did not attempt to stand him and he would get up and go test I will check with therapy.  Impression/plan #1 MRSA pneumonia. He appears to have made a recovery is on doxycycline. Is not coughing there is no reason to continue isolation #2 Klebsiella pyelonephritis/bacteremia on Levaquin Foley catheter in place. Completing Levaquin as ordered #3 urinary retention in the setting of prostate cancer. Has a Foley catheter in place with hematuria. Following with urology, in fact he has an appointment #4 history of L3 kyphoplasty significant low back pain and sciatica. Actually this appears to be somewhat better today #5 C-spine pain is somewhat concerning. We'll attempt to review his most recent bone scan. #6 renal insufficiency-Lasix and ARB on hold. I will review his lab next week along with a CBC and consider reinstituting one or both of these medications. #7 type 2 diabetes on insulin and Amaryl. Monitor his CBCs carefully he is at some risk for hypoglycemia.  Overall I am uncertain where we were with the issue of mobilization, at one point this man was attempting to go back to his home although I'm not sure that that is possible. He lives with his wife. Followup labs ordered, he has an appointment with urology today

## 2013-01-16 ENCOUNTER — Emergency Department (HOSPITAL_COMMUNITY): Payer: Medicare Other

## 2013-01-16 ENCOUNTER — Encounter (HOSPITAL_COMMUNITY): Payer: Self-pay | Admitting: Emergency Medicine

## 2013-01-16 ENCOUNTER — Emergency Department (HOSPITAL_COMMUNITY)
Admission: EM | Admit: 2013-01-16 | Discharge: 2013-01-17 | Disposition: A | Discharge status: Home / Self Care | Payer: Medicare Other | Attending: Emergency Medicine | Admitting: Emergency Medicine

## 2013-01-16 DIAGNOSIS — Z8546 Personal history of malignant neoplasm of prostate: Secondary | ICD-10-CM | POA: Insufficient documentation

## 2013-01-16 DIAGNOSIS — Z79899 Other long term (current) drug therapy: Secondary | ICD-10-CM | POA: Insufficient documentation

## 2013-01-16 DIAGNOSIS — M810 Age-related osteoporosis without current pathological fracture: Secondary | ICD-10-CM | POA: Insufficient documentation

## 2013-01-16 DIAGNOSIS — Z8619 Personal history of other infectious and parasitic diseases: Secondary | ICD-10-CM | POA: Insufficient documentation

## 2013-01-16 DIAGNOSIS — Z9861 Coronary angioplasty status: Secondary | ICD-10-CM | POA: Insufficient documentation

## 2013-01-16 DIAGNOSIS — Z794 Long term (current) use of insulin: Secondary | ICD-10-CM | POA: Insufficient documentation

## 2013-01-16 DIAGNOSIS — K219 Gastro-esophageal reflux disease without esophagitis: Secondary | ICD-10-CM | POA: Insufficient documentation

## 2013-01-16 DIAGNOSIS — E119 Type 2 diabetes mellitus without complications: Secondary | ICD-10-CM | POA: Insufficient documentation

## 2013-01-16 DIAGNOSIS — Z7901 Long term (current) use of anticoagulants: Secondary | ICD-10-CM | POA: Insufficient documentation

## 2013-01-16 DIAGNOSIS — E785 Hyperlipidemia, unspecified: Secondary | ICD-10-CM | POA: Insufficient documentation

## 2013-01-16 DIAGNOSIS — Z9889 Other specified postprocedural states: Secondary | ICD-10-CM | POA: Insufficient documentation

## 2013-01-16 DIAGNOSIS — Z8739 Personal history of other diseases of the musculoskeletal system and connective tissue: Secondary | ICD-10-CM | POA: Insufficient documentation

## 2013-01-16 DIAGNOSIS — Z8551 Personal history of malignant neoplasm of bladder: Secondary | ICD-10-CM | POA: Insufficient documentation

## 2013-01-16 DIAGNOSIS — N509 Disorder of male genital organs, unspecified: Secondary | ICD-10-CM | POA: Insufficient documentation

## 2013-01-16 DIAGNOSIS — Z8673 Personal history of transient ischemic attack (TIA), and cerebral infarction without residual deficits: Secondary | ICD-10-CM | POA: Insufficient documentation

## 2013-01-16 DIAGNOSIS — Z7982 Long term (current) use of aspirin: Secondary | ICD-10-CM | POA: Insufficient documentation

## 2013-01-16 DIAGNOSIS — I2510 Atherosclerotic heart disease of native coronary artery without angina pectoris: Secondary | ICD-10-CM | POA: Insufficient documentation

## 2013-01-16 DIAGNOSIS — M129 Arthropathy, unspecified: Secondary | ICD-10-CM | POA: Insufficient documentation

## 2013-01-16 DIAGNOSIS — F329 Major depressive disorder, single episode, unspecified: Secondary | ICD-10-CM | POA: Insufficient documentation

## 2013-01-16 DIAGNOSIS — Z8669 Personal history of other diseases of the nervous system and sense organs: Secondary | ICD-10-CM | POA: Insufficient documentation

## 2013-01-16 DIAGNOSIS — Z87448 Personal history of other diseases of urinary system: Secondary | ICD-10-CM | POA: Insufficient documentation

## 2013-01-16 DIAGNOSIS — M1990 Unspecified osteoarthritis, unspecified site: Secondary | ICD-10-CM | POA: Insufficient documentation

## 2013-01-16 DIAGNOSIS — I10 Essential (primary) hypertension: Secondary | ICD-10-CM | POA: Insufficient documentation

## 2013-01-16 DIAGNOSIS — IMO0002 Reserved for concepts with insufficient information to code with codable children: Secondary | ICD-10-CM

## 2013-01-16 DIAGNOSIS — N390 Urinary tract infection, site not specified: Secondary | ICD-10-CM | POA: Insufficient documentation

## 2013-01-16 DIAGNOSIS — Z9089 Acquired absence of other organs: Secondary | ICD-10-CM | POA: Insufficient documentation

## 2013-01-16 DIAGNOSIS — Z860100 Personal history of colon polyps, unspecified: Secondary | ICD-10-CM | POA: Insufficient documentation

## 2013-01-16 DIAGNOSIS — F3289 Other specified depressive episodes: Secondary | ICD-10-CM | POA: Insufficient documentation

## 2013-01-16 DIAGNOSIS — Z9079 Acquired absence of other genital organ(s): Secondary | ICD-10-CM | POA: Insufficient documentation

## 2013-01-16 LAB — URINALYSIS, ROUTINE W REFLEX MICROSCOPIC
Bilirubin Urine: NEGATIVE
Glucose, UA: NEGATIVE mg/dL
Protein, ur: 30 mg/dL — AB
Specific Gravity, Urine: 1.016 (ref 1.005–1.030)

## 2013-01-16 LAB — BASIC METABOLIC PANEL
BUN: 22 mg/dL (ref 6–23)
Calcium: 8.1 mg/dL — ABNORMAL LOW (ref 8.4–10.5)
Chloride: 99 mEq/L (ref 96–112)
Creatinine, Ser: 1.27 mg/dL (ref 0.50–1.35)
GFR calc Af Amer: 56 mL/min — ABNORMAL LOW (ref >90)

## 2013-01-16 LAB — URINE MICROSCOPIC-ADD ON

## 2013-01-16 LAB — CBC
HCT: 31 % — ABNORMAL LOW (ref 39.0–52.0)
MCH: 32.1 pg (ref 26.0–34.0)
MCV: 94.8 fL (ref 78.0–100.0)
RDW: 14.3 % (ref 11.5–15.5)
WBC: 8.5 10*3/uL (ref 4.0–10.5)

## 2013-01-16 MED ORDER — HYDROCODONE-ACETAMINOPHEN 5-325 MG PO TABS
1.0000 | ORAL_TABLET | Freq: Once | ORAL | Status: AC
  Administered 2013-01-16: 1 via ORAL
  Filled 2013-01-16: qty 1

## 2013-01-16 MED ORDER — DEXTROSE 5 % IV SOLN
1.0000 g | INTRAVENOUS | Status: DC
  Administered 2013-01-16: 1 g via INTRAVENOUS
  Filled 2013-01-16: qty 10

## 2013-01-16 MED ORDER — CEPHALEXIN 500 MG PO CAPS: 500.0000 mg | ORAL_CAPSULE | Freq: Three times a day (TID) | ORAL | Status: DC

## 2013-01-16 NOTE — ED Notes (Signed)
Pt in route to Ultrasound.

## 2013-01-16 NOTE — ED Notes (Signed)
Patient reports bilateral testicle pain x2 days. Pt does have a Hx of failed urethra stent. Pt currently has a Foley in place at this time. Alertx4, NAD. Vitals Stable. BP150/72, P:60, R:16.

## 2013-01-16 NOTE — ED Provider Notes (Addendum)
History     CSN: [id number]  Arrival date & time 01/16/13  1932   First MD Initiated Contact with Patient 01/16/13 1944      Chief Complaint  Patient presents with  . Testicle Pain    (Consider location/radiation/quality/duration/timing/severity/associated sxs/prior treatment) Patient is a 79 y.o. male presenting with testicular pain. The history is provided by the patient and the spouse.  Testicle Pain Pertinent negatives include no chest pain, no abdominal pain, no headaches and no shortness of breath.  pt with hx metastatic prostate ca, foley cath, hx multiple prior penile implant surgeries, presents c/o right testicle pain in past day. Constant. Dull, moderate, non radiating. Denies hx same pain. No dysuria. Foley working normally, normal urine output. No abd pain or distension. No nv. No trauma or injury to area. No swelling or bruising to area. No fever or chills.     Past Medical History  Diagnosis Date  . Hypertension   . Diabetes mellitus without complication   . Arthritis   . Sciatica   . GERD (gastroesophageal reflux disease)   . Bladder cancer   . Urge incontinence   . Prostate cancer     S/P prostatectomy; Lung metastasis  . Vertigo   . Hyperlipidemia   . ED (erectile dysfunction)   . Gastric polyposis   . Osteoporosis   . Decreased libido   . Depression   . Hepatitis A   . CAD (coronary artery disease)   . Stroke   . History of blood transfusion     1946  . OA (osteoarthritis)     Past Surgical History  Procedure Laterality Date  . Coronary angioplasty with stent placement  1996  . Carpal tunnel release    . Cataract extraction w/ intraocular lens  implant, bilateral  1998  . Prostatectomy    . Urinary sphincter implant    . Urinary sphincter implant revision    . Appendectomy    . Left hip surgery      Donated bone for bone graft to arm  . Penile prosthesis implant      S/P removal and re-implantation of new prosthesis  . Middle ear surgery     . Finger surgery      Left and right  . Hernia repair    . Bladder surgery    . Right arm bone graft      Pathological fracture    Family History  Problem Relation Age of Onset  . Heart failure Mother   . Bladder Cancer Father   . Pancreatic cancer Sister   . Lung cancer Sister   . Breast cancer Daughter   . Liver disease Son     History  Substance Use Topics  . Smoking status: Former Games developer  . Smokeless tobacco: Never Used  . Alcohol Use: No      Review of Systems  Constitutional: Negative for fever and chills.  HENT: Negative for neck pain.   Eyes: Negative for redness.  Respiratory: Negative for shortness of breath.   Cardiovascular: Negative for chest pain.  Gastrointestinal: Negative for vomiting, abdominal pain and abdominal distention.  Genitourinary: Positive for testicular pain. Negative for flank pain and penile pain.  Musculoskeletal: Negative for back pain.  Skin: Negative for rash.  Neurological: Negative for headaches.  Hematological: Does not bruise/bleed easily.  Psychiatric/Behavioral: Negative for confusion.    Allergies  Albuterol sulfate hfa; Adhesive; Penicillins; and Sulfa drugs cross reactors  Home Medications   Current Outpatient Rx  Name  Route  Sig  Dispense  Refill  . acetaminophen (TYLENOL) 325 MG tablet   Oral   Take 325 mg by mouth daily.          [name] aspirin 81 MG chewable tablet   Oral   Chew 1 tablet (81 mg total) by mouth daily.   30 tablet   2   . bicalutamide (CASODEX) 50 MG tablet   Oral   Take 50 mg by mouth daily.         . bisacodyl (DULCOLAX) 10 MG suppository   Rectal   Place 10 mg rectally as needed for constipation (for constipation not relieved by MOM).         . Calcium Carbonate-Vitamin D (CALCIUM 600+D HIGH POTENCY) 600-400 MG-UNIT per tablet   Oral   Take 1 tablet by mouth 2 (two) times daily.           . cycloSPORINE (RESTASIS) 0.05 % ophthalmic emulsion   Both Eyes   Place 1 drop into  both eyes 2 (two) times daily.           [name] docusate sodium (COLACE) 100 MG capsule   Oral   Take 100 mg by mouth 2 (two) times daily.          [name] doxycycline (VIBRAMYCIN) 50 MG capsule   Oral   Take 2 capsules (100 mg total) by mouth 2 (two) times daily.   15 capsule   0   . EXPIRED: enoxaparin (LOVENOX) 80 MG/0.8ML injection   Subcutaneous   Inject 0.7 mLs (70 mg total) into the skin every 12 (twelve) hours.   22.4 Syringe   10     Continue until INR greater than 2.0 for 2 consecut ...   . gabapentin (NEURONTIN) 100 MG capsule   Oral   Take 100 mg by mouth 3 (three) times daily.          [name] glimepiride (AMARYL) 4 MG tablet   Oral   Take 4 mg by mouth daily before breakfast.           . insulin aspart (NOVOLOG) 100 UNIT/ML injection   Subcutaneous   Inject 8 Units into the skin once.         . insulin glargine (LANTUS) 100 UNIT/ML injection   Subcutaneous   Inject 14 Units into the skin at bedtime.   10 mL   0   . levofloxacin (LEVAQUIN) 750 MG tablet   Oral   Take 1 tablet (750 mg total) by mouth daily.   10 tablet   0   . loratadine (CLARITIN) 10 MG tablet   Oral   Take 10 mg by mouth daily as needed for allergies.          . magnesium hydroxide (MILK OF MAGNESIA) 400 MG/5ML suspension   Oral   Take 30 mLs by mouth daily as needed for constipation.         . meclizine (ANTIVERT) 25 MG tablet   Oral   Take 25 mg by mouth 3 (three) times daily as needed for dizziness.          . metoprolol (LOPRESSOR) 50 MG tablet   Oral   Take 25 mg by mouth 2 (two) times daily.           . Multiple Vitamins-Minerals (MULTIVITAMINS THER. W/MINERALS) TABS   Oral   Take 1 tablet by mouth every morning.          [name]  nitroGLYCERIN (NITROSTAT) 0.4 MG SL tablet   Sublingual   Place 0.4 mg under the tongue every 5 (five) minutes as needed for chest pain.          [name] omeprazole (PRILOSEC) 20 MG capsule   Oral   Take 20 mg by mouth 2 (two) times daily.            [name] oxybutynin (DITROPAN) 5 MG tablet   Oral   Take 5 mg by mouth at bedtime.         . pantoprazole (PROTONIX) 40 MG tablet   Oral   Take 1 tablet (40 mg total) by mouth daily at 12 noon.   30 tablet   0   . polyethylene glycol (MIRALAX / GLYCOLAX) packet   Oral   Take 17 g by mouth daily.   14 each   0   . potassium chloride (K-DUR) 10 MEQ tablet   Oral   Take 1 tablet (10 mEq total) by mouth daily.   30 tablet   99   . sertraline (ZOLOFT) 100 MG tablet   Oral   Take 200 mg by mouth at bedtime.          . simvastatin (ZOCOR) 40 MG tablet   Oral   Take 40 mg by mouth at bedtime.         . sodium phosphate (FLEET) enema   Rectal   Place 1 enema rectally once as needed (for constipation not relieved by MOM or Bisacodyl Supp.). follow package directions         . traMADol (ULTRAM) 50 MG tablet   Oral   Take 25 mg by mouth every 8 (eight) hours.         [name] warfarin (COUMADIN) 2.5 MG tablet   Oral   Take 1 tablet (2.5 mg total) by mouth daily.   30 tablet   0   . warfarin (COUMADIN) 2.5 MG tablet   Oral   Take 1 tablet (2.5 mg total) by mouth daily at 6 PM.   1 tablet   0     On 01/05/13     BP 163/62  Pulse 64  Temp(Src) 97.6 F (36.4 C) (Oral)  Resp 22  SpO2 100%  Physical Exam  Nursing note and vitals reviewed. Constitutional: He appears well-developed and well-nourished. No distress.  HENT:  Nose: Nose normal.  Mouth/Throat: Oropharynx is clear and moist.  Eyes: Conjunctivae are normal.  Neck: Neck supple. No tracheal deviation present.  Cardiovascular: Normal rate, regular rhythm, normal heart sounds and intact distal pulses.   Pulmonary/Chest: Effort normal. No accessory muscle usage. No respiratory distress.  Abdominal: Soft. Bowel sounds are normal. He exhibits no distension and no mass. There is no tenderness. There is no rebound and no guarding.  Genitourinary:  Foley cath in place. Right testicular tenderness. ?mild  scrotal erythema, no fluctuance/abscess.  Penile prosthesis/reservoir palp superior aspect right scrotum, adjacent to epididymis/testicle, entire area tender, ?mildy swollen.   Musculoskeletal: Normal range of motion. He exhibits no edema and no tenderness.  Neurological: He is alert.  Skin: Skin is warm and dry.  Psychiatric: He has a normal mood and affect.    ED Course  Procedures (including critical care time)  Results for orders placed during the hospital encounter of 01/16/13  CBC      Result Value Range   WBC 8.5  4.0 - 10.5 K/uL   RBC 3.27 (*) 4.22 - 5.81 MIL/uL   Hemoglobin 10.5 (*)  13.0 - 17.0 g/dL   HCT 45.4 (*) 09.8 - 11.9 %   MCV 94.8  78.0 - 100.0 fL   MCH 32.1  26.0 - 34.0 pg   MCHC 33.9  30.0 - 36.0 g/dL   RDW 14.7  82.9 - 56.2 %   Platelets 181  150 - 400 K/uL  BASIC METABOLIC PANEL      Result Value Range   Sodium 133 (*) 135 - 145 mEq/L   Potassium 4.4  3.5 - 5.1 mEq/L   Chloride 99  96 - 112 mEq/L   CO2 28  19 - 32 mEq/L   Glucose, Bld 278 (*) 70 - 99 mg/dL   BUN 22  6 - 23 mg/dL   Creatinine, Ser 1.30  0.50 - 1.35 mg/dL   Calcium 8.1 (*) 8.4 - 10.5 mg/dL   GFR calc non Af Amer 48 (*) >90 mL/min   GFR calc Af Amer 56 (*) >90 mL/min  PROTIME-INR      Result Value Range   Prothrombin Time 27.3 (*) 11.6 - 15.2 seconds   INR 2.69 (*) 0.00 - 1.49  URINALYSIS, ROUTINE W REFLEX MICROSCOPIC      Result Value Range   Color, Urine YELLOW  YELLOW   APPearance CLOUDY (*) CLEAR   Specific Gravity, Urine 1.016  1.005 - 1.030   pH 6.0  5.0 - 8.0   Glucose, UA NEGATIVE  NEGATIVE mg/dL   Hgb urine dipstick LARGE (*) NEGATIVE   Bilirubin Urine NEGATIVE  NEGATIVE   Ketones, ur NEGATIVE  NEGATIVE mg/dL   Protein, ur 30 (*) NEGATIVE mg/dL   Urobilinogen, UA 0.2  0.0 - 1.0 mg/dL   Nitrite NEGATIVE  NEGATIVE   Leukocytes, UA LARGE (*) NEGATIVE  URINE MICROSCOPIC-ADD ON      Result Value Range   Squamous Epithelial / LPF RARE  RARE   WBC, UA 21-50  <3 WBC/hpf   RBC  / HPF 3-6  <3 RBC/hpf   Bacteria, UA RARE  RARE   Urine-Other FEW YEAST       US Scrotum  01/16/2013  *RADIOLOGY REPORT*  Clinical Data: Bilateral testicular pain.  SCROTAL ULTRASOUND DOPPLER ULTRASOUND OF THE TESTICLES  Technique:  Complete ultrasound examination of the testicles, epididymis, and other scrotal structures was performed.  Color and spectral Doppler ultrasound were also utilized to evaluate blood flow to the testicles.  Comparison:  MRI of the pelvis performed 10/04/2011  Findings:  The testicles are symmetric in size and echogenicity. The right testis measures 2.5 x 1.6 x 1.9 cm, while the left testis measures 2.9 x 1.5 x 2.0 cm.  No testicular masses are seen, and there is no evidence of microlithiasis.  The right epididymal head is asymmetrically enlarged, measuring 1.5 x 1.4 x 1.4 cm; this may reflect prior surgery, given that the reservoir for the patient's penile implant was near this location on the prior MRI.  No abnormal increased blood flow is seen on limited color Doppler evaluation.  Trace bilateral hydroceles remain within normal limits.  There is no evidence of varicocele, or other extra-testicular abnormality.  Blood flow is seen within both testicles on color Doppler sonography.  Doppler spectral waveforms show both arterial and venous flow signal in both testicles.  IMPRESSION: 1.  No evidence testicular mass or torsion. 2.  Asymmetric prominence of the right epididymal head; this may reflect prior surgery, given that the reservoir for the patient's penile implant was seen near this location on the prior  MRI.  No associated hyperemia seen to suggest epididymitis.  No definite mass identified.   Original Report Authenticated By: Tonia Ghent, M.D.   [place] Art/ven Flow Abd Pelv Doppler  01/16/2013  *RADIOLOGY REPORT*  Clinical Data: Bilateral testicular pain.  SCROTAL ULTRASOUND DOPPLER ULTRASOUND OF THE TESTICLES  Technique:  Complete ultrasound examination of the testicles,  epididymis, and other scrotal structures was performed.  Color and spectral Doppler ultrasound were also utilized to evaluate blood flow to the testicles.  Comparison:  MRI of the pelvis performed 10/04/2011  Findings:  The testicles are symmetric in size and echogenicity. The right testis measures 2.5 x 1.6 x 1.9 cm, while the left testis measures 2.9 x 1.5 x 2.0 cm.  No testicular masses are seen, and there is no evidence of microlithiasis.  The right epididymal head is asymmetrically enlarged, measuring 1.5 x 1.4 x 1.4 cm; this may reflect prior surgery, given that the reservoir for the patient's penile implant was near this location on the prior MRI.  No abnormal increased blood flow is seen on limited color Doppler evaluation.  Trace bilateral hydroceles remain within normal limits.  There is no evidence of varicocele, or other extra-testicular abnormality.  Blood flow is seen within both testicles on color Doppler sonography.  Doppler spectral waveforms show both arterial and venous flow signal in both testicles.  IMPRESSION: 1.  No evidence testicular mass or torsion. 2.  Asymmetric prominence of the right epididymal head; this may reflect prior surgery, given that the reservoir for the patient's penile implant was seen near this location on the prior MRI.  No associated hyperemia seen to suggest epididymitis.  No definite mass identified.   Original Report Authenticated By: Tonia Ghent, M.D.      MDM  Labs.   vicodin for pain.  Reviewed nursing notes and prior charts for additional history.  Recent complicated hospital stay reviewed, ?uti/urosepsis w resp failure, brief intubation. Since then noted dnr status. Residing in [place].   Given testicular/epididymal tenderness, and tenderness around prosthesis reservoir, urology on call paged.   Pt with uti on labs, recent urine culture pos klebsiella, which is sens to levaquin.   Discussed pt, hx inf pen prosthesis reservoir/sphincter, current  symptoms, uti kleb being tx w levaquin, current exam, u/s findings, labs, with urology on call, Dr Vernie Ammons - he indicates to give dose rocephin in ed, d/c to [facility], have f/u w them Monday, to [facility] ED if worse, fevers, inc pain/swelling.   Recheck pt, no change in exam from prior. Comfortable.  Discussed u/s and labs w pt/spouse and discussed w urology on call. They state already have appt this Tuesday.  Rocephin iv in ed.  Recent cx also sens cefazolin, will add keflex for additional coverage. Urology indicates will f/u Monday.   Recheck pt content, alert, no pain. Appears stable for d/c.           Suzi Roots, MD 01/16/13 1610  Suzi Roots, MD 01/16/13 2245

## 2013-01-16 NOTE — ED Notes (Signed)
PTAR paged. 

## 2013-01-16 NOTE — ED Notes (Signed)
Family Contact Information: Hassen Bruun (Wife)- [phone] [facility]), [phone] (Cell)

## 2013-01-18 ENCOUNTER — Encounter: Payer: Self-pay | Admitting: Adult Health

## 2013-01-18 ENCOUNTER — Non-Acute Institutional Stay (SKILLED_NURSING_FACILITY): Payer: Medicare Other | Admitting: Adult Health

## 2013-01-18 DIAGNOSIS — R634 Abnormal weight loss: Secondary | ICD-10-CM

## 2013-01-18 DIAGNOSIS — E119 Type 2 diabetes mellitus without complications: Secondary | ICD-10-CM

## 2013-01-18 DIAGNOSIS — Z7901 Long term (current) use of anticoagulants: Secondary | ICD-10-CM | POA: Insufficient documentation

## 2013-01-18 DIAGNOSIS — I82409 Acute embolism and thrombosis of unspecified deep veins of unspecified lower extremity: Secondary | ICD-10-CM

## 2013-01-18 DIAGNOSIS — I82403 Acute embolism and thrombosis of unspecified deep veins of lower extremity, bilateral: Secondary | ICD-10-CM

## 2013-01-18 MED ORDER — INSULIN ASPART 100 UNIT/ML ~~LOC~~ SOLN: 3.0000 [IU] | Freq: Three times a day (TID) | SUBCUTANEOUS | Status: DC

## 2013-01-18 MED ORDER — WARFARIN SODIUM 3 MG PO TABS: 1.5000 mg | ORAL_TABLET | Freq: Every day | ORAL | Status: DC

## 2013-01-18 MED ORDER — INSULIN GLARGINE 100 UNIT/ML ~~LOC~~ SOLN: 10.0000 [IU] | Freq: Every day | SUBCUTANEOUS | Status: DC

## 2013-01-18 NOTE — Assessment & Plan Note (Signed)
His current weight is 139 pounds; he has weighed 150 pounds; he is presently being treated per facility protocols for supplements. Last week his weight was 147 pounds I am not certain at this time if the 139 weight was accurate.

## 2013-01-18 NOTE — Assessment & Plan Note (Signed)
He is presently stable is on coumadin therapy for management of his dvt's.

## 2013-01-18 NOTE — Assessment & Plan Note (Signed)
Today's inr is 3.2 and he is taking coumadin 2 mg daily

## 2013-01-18 NOTE — Assessment & Plan Note (Addendum)
His diabetes is not adequately controlled. His am cbg's are running somewhat low between 70's to 100's. His ac supper cbg's are all elevated. He is taking lantus 14 units nightly and is taking amaryl 4 mg daily

## 2013-01-18 NOTE — Progress Notes (Signed)
Patient ID: Guy [name], male   DOB: 01-11-1920, 79 y.o.   MRN: [id number]  Chief Complaint  Patient presents with  . Acute Visit    diabetes; weight loss and inr management     HPI:  DM (diabetes mellitus) His diabetes is not adequately controlled. His am cbg's are running somewhat low between 70's to 100's. His ac supper cbg's are all elevated. He is taking lantus 14 units nightly and is taking amaryl 4 mg daily   DVT of lower extremity, bilateral He is presently stable is on coumadin therapy for management of his dvt's.   Coagulation disorder Today's inr is 3.2 and he is taking coumadin 2 mg daily   Loss of weight His current weight is 139 pounds; he has weighed 150 pounds; he is presently being treated per facility protocols for supplements. Last week his weight was 147 pounds I am not certain at this time if the 139 weight was accurate.    Past Medical History  Diagnosis Date  . Hypertension   . Diabetes mellitus without complication   . Arthritis   . Sciatica   . GERD (gastroesophageal reflux disease)   . Bladder cancer   . Urge incontinence   . Prostate cancer     S/P prostatectomy; Lung metastasis  . Vertigo   . Hyperlipidemia   . ED (erectile dysfunction)   . Gastric polyposis   . Osteoporosis   . Decreased libido   . Depression   . Hepatitis A   . CAD (coronary artery disease)   . Stroke   . History of blood transfusion     1946  . OA (osteoarthritis)     Past Surgical History  Procedure Laterality Date  . Coronary angioplasty with stent placement  1996  . Carpal tunnel release    . Cataract extraction w/ intraocular lens  implant, bilateral  1998  . Prostatectomy    . Urinary sphincter implant    . Urinary sphincter implant revision    . Appendectomy    . Left hip surgery      Donated bone for bone graft to arm  . Penile prosthesis implant      S/P removal and re-implantation of new prosthesis  . Middle ear surgery    . Finger surgery     Left and right  . Hernia repair    . Bladder surgery    . Right arm bone graft      Pathological fracture    VITAL SIGNS BP 124/66  Pulse 60  Ht 5\' 10"  (1.778 m)  Wt 139 lb (63.05 kg)  BMI 19.94 kg/m2   Patient's Medications  New Prescriptions   No medications on file  Previous Medications   ACETAMINOPHEN (TYLENOL) 325 MG TABLET    Take 325 mg by mouth daily.    ASPIRIN 81 MG CHEWABLE TABLET    Chew 81 mg by mouth daily.   BICALUTAMIDE (CASODEX) 50 MG TABLET    Take 50 mg by mouth daily.   BISACODYL (DULCOLAX) 10 MG SUPPOSITORY    Place 10 mg rectally as needed for constipation (for constipation not relieved by MOM).   CALCIUM CARBONATE-VITAMIN D (CALCIUM 600+D HIGH POTENCY) 600-400 MG-UNIT PER TABLET    Take 1 tablet by mouth 2 (two) times daily.     CRANBERRY 450 MG TABS    Take 1 tablet by mouth 2 (two) times daily.    CYCLOSPORINE (RESTASIS) 0.05 % OPHTHALMIC EMULSION    Place 1  drop into both eyes 2 (two) times daily.     DOCUSATE SODIUM (COLACE) 100 MG CAPSULE    Take 100 mg by mouth 2 (two) times daily as needed for constipation.    FUROSEMIDE (LASIX) 20 MG TABLET    Take 20 mg by mouth daily.    GABAPENTIN (NEURONTIN) 100 MG CAPSULE    Take 100 mg by mouth 3 (three) times daily.    GLIMEPIRIDE (AMARYL) 4 MG TABLET    Take 4 mg by mouth daily before breakfast.     INSULIN GLARGINE (LANTUS) 100 UNIT/ML INJECTION    Inject 14 Units into the skin at bedtime.   LORATADINE (CLARITIN) 10 MG TABLET    Take 10 mg by mouth daily as needed for allergies.    LOSARTAN (COZAAR) 50 MG TABLET    Take 50 mg by mouth daily.   MAGNESIUM HYDROXIDE (MILK OF MAGNESIA) 400 MG/5ML SUSPENSION    Take 30 mLs by mouth daily as needed for constipation.   MECLIZINE (ANTIVERT) 25 MG TABLET    Take 25 mg by mouth 3 (three) times daily as needed for dizziness.    METOPROLOL TARTRATE (LOPRESSOR) 25 MG TABLET    Take 25 mg by mouth 2 (two) times daily.   MULTIPLE VITAMINS-MINERALS (MULTIVITAMINS THER.  W/MINERALS) TABS    Take 1 tablet by mouth every morning.    NAPROXEN SODIUM (ANAPROX) 220 MG TABLET    Take 440 mg by mouth 2 (two) times daily as needed (for pain).   NITROGLYCERIN (NITROSTAT) 0.4 MG SL TABLET    Place 0.4 mg under the tongue every 5 (five) minutes as needed for chest pain.    OMEPRAZOLE (PRILOSEC) 20 MG CAPSULE    Take 20 mg by mouth 2 (two) times daily.     OXYBUTYNIN (DITROPAN) 5 MG TABLET    Take 5 mg by mouth at bedtime.   POLYETHYLENE GLYCOL (MIRALAX / GLYCOLAX) PACKET    Take 17 g by mouth daily.   POTASSIUM CHLORIDE (K-DUR) 10 MEQ TABLET    Take 1 tablet (10 mEq total) by mouth daily.   SERTRALINE (ZOLOFT) 100 MG TABLET    Take 200 mg by mouth at bedtime.    SIMVASTATIN (ZOCOR) 40 MG TABLET    Take 40 mg by mouth at bedtime.   SODIUM PHOSPHATE (FLEET) ENEMA    Place 1 enema rectally once as needed (for constipation not relieved by MOM or Bisacodyl Supp.). follow package directions   TRAMADOL (ULTRAM) 50 MG TABLET    Take 25 mg by mouth every 8 (eight) hours.   WARFARIN (COUMADIN) 2 MG TABLET    Take 2 mg by mouth daily.  Modified Medications   Modified Medication Previous Medication   CEPHALEXIN (KEFLEX) 500 MG CAPSULE cephALEXin (KEFLEX) 500 MG capsule      Take 500 mg by mouth 3 (three) times daily.    Take 1 capsule (500 mg total) by mouth 3 (three) times daily.  Discontinued Medications   DOXYCYCLINE (VIBRAMYCIN) 100 MG CAPSULE    Take 100 mg by mouth 2 (two) times daily.   INSULIN ASPART (NOVOLOG) 100 UNIT/ML INJECTION    Inject 8 Units into the skin once.   PANTOPRAZOLE (PROTONIX) 40 MG TABLET    Take 40 mg by mouth daily at 12 noon.    SIGNIFICANT DIAGNOSTIC EXAMS     Component Value Date/Time   ALBUMIN 1.8* 01/04/2013 1120   AST 27 01/04/2013 1120   ALT 14 01/04/2013 1120  ALKPHOS 113 01/04/2013 1120   BILITOT 0.6 01/04/2013 1120       Component Value Date/Time   BUN 22 01/16/2013 2002   GLUCOSE 278* 01/16/2013 2002   CREATININE 1.27 01/16/2013 2002   K  4.4 01/16/2013 2002   NA 133* 01/16/2013 2002   TSH 1.660 12/29/2012 1710       Component Value Date/Time   WBC 8.5 01/16/2013 2002   RBC 3.27* 01/16/2013 2002   HGB 10.5* 01/16/2013 2002   HCT 31.0* 01/16/2013 2002   PLT 181 01/16/2013 2002   MCV 94.8 01/16/2013 2002     Review of Systems  Unable to perform ROS   Physical Exam  Constitutional:  Is thin  Neck: Neck supple.  Cardiovascular: Normal rate, regular rhythm and intact distal pulses.   Respiratory: Effort normal and breath sounds normal.  GI: Soft. Bowel sounds are normal.  Musculoskeletal: Normal range of motion.  Bilateral trace edema  Neurological: He is alert.  Oriented to self  Skin: Skin is warm and dry.  Psychiatric: He has a normal mood and affect.       ASSESSMENT/ PLAN:  Diabetes; bilateral dvt; weight loss; anticoagulation management  FUTURE ORDERS:   1. Will begin coumadin 1.5 mg daily will check inr in one week 2. Will stop the amaryl;  3. Will lower lantus to 10 units nightly 4. Will begin novolog 3 units ac meals for cbg >=150.  5. Will place him on daily weights to ensure that his weight of 139 pounds was accurate and will continue to monitor his status

## 2013-01-21 ENCOUNTER — Non-Acute Institutional Stay (SKILLED_NURSING_FACILITY): Payer: Medicare Other | Admitting: Internal Medicine

## 2013-01-21 DIAGNOSIS — I10 Essential (primary) hypertension: Secondary | ICD-10-CM

## 2013-01-21 DIAGNOSIS — E119 Type 2 diabetes mellitus without complications: Secondary | ICD-10-CM

## 2013-01-21 DIAGNOSIS — C61 Malignant neoplasm of prostate: Secondary | ICD-10-CM

## 2013-01-21 DIAGNOSIS — K5900 Constipation, unspecified: Secondary | ICD-10-CM

## 2013-02-04 ENCOUNTER — Non-Acute Institutional Stay (SKILLED_NURSING_FACILITY): Payer: Medicare Other | Admitting: Internal Medicine

## 2013-02-04 DIAGNOSIS — L989 Disorder of the skin and subcutaneous tissue, unspecified: Secondary | ICD-10-CM

## 2013-02-04 DIAGNOSIS — I82403 Acute embolism and thrombosis of unspecified deep veins of lower extremity, bilateral: Secondary | ICD-10-CM

## 2013-02-04 DIAGNOSIS — Z7901 Long term (current) use of anticoagulants: Secondary | ICD-10-CM

## 2013-02-04 DIAGNOSIS — I82409 Acute embolism and thrombosis of unspecified deep veins of unspecified lower extremity: Secondary | ICD-10-CM

## 2013-02-05 DIAGNOSIS — E119 Type 2 diabetes mellitus without complications: Secondary | ICD-10-CM | POA: Insufficient documentation

## 2013-02-05 DIAGNOSIS — K5900 Constipation, unspecified: Secondary | ICD-10-CM | POA: Insufficient documentation

## 2013-02-05 NOTE — Progress Notes (Signed)
Patient ID: Guy [name], male   DOB: [birth year], 79 y.o.   MRN: [id number]        PROGRESS NOTE  DATE:  01/21/2013  FACILITY: [facility]   LEVEL OF CARE: SNF  Routine Visit  CHIEF COMPLAINT:  Manage diabetes mellitus, hypertension, metastatic prostate cancer.     HISTORY OF PRESENT ILLNESS:  REASSESSMENT OF ONGOING PROBLEM(S):  DM: The patient is complaining of elevated CBGs and also hypoglycemic spells.  In review of CBG log, CBGs are normal in the morning.  At 4:30 p.m., CBGs are elevated, ranging from 200 to 400 range.  Pt denies polyuria, polydipsia, polyphagia, changes in vision or hypoglycemic episodes.  No complications noted from the medication presently being used.  Last hemoglobin A1c is: 9 in 11/2012.    HTN: Pt 's HTN remains stable.  Patient complains of  chronic lower extremity swelling.  Denies CP, sob, DOE, headaches, dizziness or visual disturbances.  No complications from the medications currently being used.  Last BP : 125/56, 170/64.     METASTATIC PROSTATE CANCER:  Patient has widely metastatic prostate cancer.  He is receiving Lupron injections and tolerating them without any problems.   PAST MEDICAL HISTORY : Reviewed.  No changes.  CURRENT MEDICATIONS: Reviewed per [facility]  REVIEW OF SYSTEMS:  GENERAL: no change in appetite, no fatigue, no weight changes, no fever, chills or weakness RESPIRATORY: no cough, SOB, DOE, wheezing, hemoptysis CARDIAC: no chest pain or palpitations;  chronic lower extremity swelling  GI: no abdominal pain, diarrhea, constipation, heart burn, nausea or vomiting  PHYSICAL EXAMINATION  VS:  T 97.5      P 60    RR 16      BP 125/70     POX %     WT (Lb) 147.2  GENERAL: no acute distress, normal body habitus EYES: conjunctivae normal, sclerae normal, normal eye lids NECK: supple, trachea midline, no neck masses, no thyroid tenderness, no thyromegaly LYMPHATICS: no LAN in the neck, no supraclavicular LAN RESPIRATORY: breathing is  even & unlabored, BS CTAB CARDIAC: RRR, no murmur,no extra heart sounds EDEMA/VARICOSITIES:  +2 bilateral lower extremity edema  ARTERIAL:  pedal pulses nonpalpable  GI: abdomen soft, normal BS, no masses, no tenderness, no hepatomegaly, no splenomegaly PSYCHIATRIC: the patient is alert & oriented to person, affect & behavior appropriate  LABS/RADIOLOGY: 11/2012:  Glucose 149, alkaline phosphatase 142, total protein 5.6, albumin 2.6, otherwise CMP normal.    Hemoglobin 9.3, MCV 94.8, platelets 93, white count 8.3.    Liver profile normal.    ASSESSMENT/PLAN:  Diabetes mellitus.  Uncontrolled.  Amaryl and Lantus were discontinued due to hypoglycemia.  We will add a sliding scale.    Hypertension.  Stable.   Metastatic prostate cancer.  Continue Lupron injections.    Constipation.  Well controlled.    Hyperlipidemia.  Well controlled.   Peripheral neuropathy.  Continue Neurontin.    GERD.  Well controlled.     CPT CODE: 78469

## 2013-02-08 ENCOUNTER — Encounter: Payer: Self-pay | Admitting: Adult Health

## 2013-02-08 ENCOUNTER — Non-Acute Institutional Stay (SKILLED_NURSING_FACILITY): Payer: Medicare Other | Admitting: Adult Health

## 2013-02-08 DIAGNOSIS — I82409 Acute embolism and thrombosis of unspecified deep veins of unspecified lower extremity: Secondary | ICD-10-CM

## 2013-02-08 DIAGNOSIS — E1149 Type 2 diabetes mellitus with other diabetic neurological complication: Secondary | ICD-10-CM

## 2013-02-08 DIAGNOSIS — I10 Essential (primary) hypertension: Secondary | ICD-10-CM

## 2013-02-08 DIAGNOSIS — R2681 Unsteadiness on feet: Secondary | ICD-10-CM

## 2013-02-08 DIAGNOSIS — R269 Unspecified abnormalities of gait and mobility: Secondary | ICD-10-CM

## 2013-02-08 DIAGNOSIS — I82403 Acute embolism and thrombosis of unspecified deep veins of lower extremity, bilateral: Secondary | ICD-10-CM

## 2013-02-08 NOTE — Progress Notes (Signed)
Subjective:    Patient ID: Guy [name], male    DOB: 08-11-1920, 79 y.o.   MRN: [id number]   Allergies  Allergen Reactions  . Albuterol Sulfate Hfa (ION:[id number]) Shortness Of Breath  . Adhesive (Tape) Other (See Comments)    REACTION: SKIN BLISTERS  . Penicillins Other (See Comments)    REACTION: ITCHING HANDS  . Sulfa Drugs Cross Reactors Hives    Chief Complaint  Patient presents with  . Discharge Note    HPI  He is being discharged to home with family he will need home health for pt/ot/nursing/coumadin management he will need a lightweight wheelchair with a removable arm.    Past Medical History  Diagnosis Date  . Hypertension   . Diabetes mellitus without complication   . Arthritis   . Sciatica   . GERD (gastroesophageal reflux disease)   . Bladder cancer   . Urge incontinence   . Prostate cancer     S/P prostatectomy; Lung metastasis  . Vertigo   . Hyperlipidemia   . ED (erectile dysfunction)   . Gastric polyposis   . Osteoporosis   . Decreased libido   . Depression   . Hepatitis A   . CAD (coronary artery disease)   . Stroke   . History of blood transfusion     1946  . OA (osteoarthritis)     Past Surgical History  Procedure Laterality Date  . Coronary angioplasty with stent placement  1996  . Carpal tunnel release    . Cataract extraction w/ intraocular lens  implant, bilateral  1998  . Prostatectomy    . Urinary sphincter implant    . Urinary sphincter implant revision    . Appendectomy    . Left hip surgery      Donated bone for bone graft to arm  . Penile prosthesis implant      S/P removal and re-implantation of new prosthesis  . Middle ear surgery    . Finger surgery      Left and right  . Hernia repair    . Bladder surgery    . Right arm bone graft      Pathological fracture    Filed Vitals:   02/08/13 1545  BP: 131/70  Pulse: 73  Height: 5\' 10"  (1.778 m)  Weight: 139 lb 3.2 oz (63.141 kg)       Component Value  Date/Time   ALBUMIN 1.8* 01/04/2013 1120   AST 27 01/04/2013 1120   ALT 14 01/04/2013 1120   ALKPHOS 113 01/04/2013 1120   BILITOT 0.6 01/04/2013 1120       Component Value Date/Time   BUN 22 01/16/2013 2002   GLUCOSE 278* 01/16/2013 2002   CREATININE 1.27 01/16/2013 2002   K 4.4 01/16/2013 2002   NA 133* 01/16/2013 2002   TSH 1.660 12/29/2012 1710       Component Value Date/Time   WBC 8.5 01/16/2013 2002   RBC 3.27* 01/16/2013 2002   HGB 10.5* 01/16/2013 2002   HCT 31.0* 01/16/2013 2002   PLT 181 01/16/2013 2002   MCV 94.8 01/16/2013 2002      Review of Systems  Constitutional: Negative for appetite change.  Respiratory: Negative for cough and shortness of breath.   Cardiovascular: Negative for chest pain and leg swelling.  Gastrointestinal: Positive for abdominal pain. Negative for constipation.  Musculoskeletal: Negative for myalgias and arthralgias.  Skin: Negative.   Psychiatric/Behavioral: The patient is not nervous/anxious.  Objective:   Physical Exam  Constitutional: He is oriented to person, place, and time.  thin  Neck: Neck supple.  Cardiovascular: Normal rate and intact distal pulses.   irregular  Pulmonary/Chest: Effort normal and breath sounds normal.  Abdominal: Soft. Bowel sounds are normal.  Musculoskeletal: Normal range of motion.  Neurological: He is alert and oriented to person, place, and time.  Skin: Skin is warm and dry.  Psychiatric: He has a normal mood and affect.          Assessment & Plan:    Will discharge him to home with home health for pt/ot/nursing/coumadin management; will need a lightweight wheelchair to allow him to be independent with his adl care due to his gait instability. Prescriptions written.   Time spent with patient: 45 minutes

## 2013-02-12 DIAGNOSIS — N183 Chronic kidney disease, stage 3 (moderate): Secondary | ICD-10-CM

## 2013-02-12 DIAGNOSIS — I129 Hypertensive chronic kidney disease with stage 1 through stage 4 chronic kidney disease, or unspecified chronic kidney disease: Secondary | ICD-10-CM

## 2013-02-12 DIAGNOSIS — E1129 Type 2 diabetes mellitus with other diabetic kidney complication: Secondary | ICD-10-CM

## 2013-02-17 ENCOUNTER — Telehealth: Payer: Self-pay | Admitting: Cardiovascular Disease

## 2013-02-17 ENCOUNTER — Other Ambulatory Visit: Payer: Self-pay | Admitting: Adult Health

## 2013-02-17 NOTE — Telephone Encounter (Signed)
Pt is scheduled to see Dr Allyson Sabal tomorrow-she wants you to be aware of some things evaluated from her home visit!

## 2013-02-17 NOTE — Telephone Encounter (Signed)
Cristy called back and informed that we would refill his coumadin tomarrow during his visit with Dr. Allyson Sabal

## 2013-02-17 NOTE — Telephone Encounter (Signed)
Guy [name] called in a refill to [facility] this morning for Guy [name] for his coumadin.  He only has one pill left and the drugstore has not heard back from [place] on the refill. She is worried!

## 2013-02-17 NOTE — Telephone Encounter (Signed)
cristy from [facility] called today stated she was checking on Mr. Guy [name] at home and discovered that since his discharge from the hospital he had not received any lovenox injections due to a mix up at [facility] . Cristy states he has been taking his coumadin at 6 mg daily.and is doing well. I told her we would check his INR when he comes in tomarrow.

## 2013-02-18 ENCOUNTER — Other Ambulatory Visit: Payer: Self-pay | Admitting: *Deleted

## 2013-02-18 ENCOUNTER — Encounter: Payer: Self-pay | Admitting: Cardiovascular Disease

## 2013-02-18 ENCOUNTER — Ambulatory Visit (INDEPENDENT_AMBULATORY_CARE_PROVIDER_SITE_OTHER): Payer: Medicare Other | Admitting: Pharmacist Clinician (PhC)/ Clinical Pharmacy Specialist

## 2013-02-18 ENCOUNTER — Ambulatory Visit (INDEPENDENT_AMBULATORY_CARE_PROVIDER_SITE_OTHER): Payer: Medicare Other | Admitting: Cardiovascular Disease

## 2013-02-18 ENCOUNTER — Encounter: Payer: Medicare Other | Admitting: Pharmacist Clinician (PhC)/ Clinical Pharmacy Specialist

## 2013-02-18 VITALS — BP 124/60 | HR 60 | Wt 137.0 lb

## 2013-02-18 DIAGNOSIS — I82409 Acute embolism and thrombosis of unspecified deep veins of unspecified lower extremity: Secondary | ICD-10-CM

## 2013-02-18 DIAGNOSIS — E785 Hyperlipidemia, unspecified: Secondary | ICD-10-CM

## 2013-02-18 DIAGNOSIS — E119 Type 2 diabetes mellitus without complications: Secondary | ICD-10-CM | POA: Insufficient documentation

## 2013-02-18 DIAGNOSIS — I2510 Atherosclerotic heart disease of native coronary artery without angina pectoris: Secondary | ICD-10-CM

## 2013-02-18 DIAGNOSIS — I82403 Acute embolism and thrombosis of unspecified deep veins of lower extremity, bilateral: Secondary | ICD-10-CM

## 2013-02-18 DIAGNOSIS — Z7901 Long term (current) use of anticoagulants: Secondary | ICD-10-CM

## 2013-02-18 LAB — POCT INR: INR: 2.9

## 2013-02-18 MED ORDER — WARFARIN SODIUM 6 MG PO TABS: 6.0000 mg | ORAL_TABLET | Freq: Every day | ORAL | Status: DC

## 2013-02-18 NOTE — Progress Notes (Signed)
02/18/2013 Guy [name]   [birth year]  [id number]  Primary Physician Junious Silk, MD Primary Cardiologist: Runell Gess MD Guy [name]   HPI:  The patient is a very pleasant 79 year old thin and frail appearing married Caucasian male, father of 3, grandfather of 4 great grandchildren who is accompanied by his wife today who is also a patient of mine. I last saw him 3 years ago. He has a history of CAD status post remote LAD stenting in 1996 in [place]. I catheterized him November 27, 2001 revealing a patent LAD stent with mild in-stent restenosis. His last Myoview performed in March of 2008 was nonischemic, as was one on June 12, 2009. His other problems include hypertension, hyperlipidemia, and non-insulin-requiring diabetes. He does have metastatic prostate cancer involving his lungs and ribs and has had multiple operations for these. He gets occasionally chest pain which is nitrate responsive. Since I saw him last he had hospitalizations for fractured vertebrae requiring kyphoplasty as well as urinary tract infection and surgery on his urethra.      Current Outpatient Prescriptions  Medication Sig Dispense Refill  . acetaminophen (TYLENOL) 325 MG tablet Take 325 mg by mouth daily.       . bicalutamide (CASODEX) 50 MG tablet Take 50 mg by mouth daily.      . Biotin 5 MG TABS Take 5 mg by mouth daily.      . Calcium Carbonate-Vitamin D (CALCIUM 600+D HIGH POTENCY) 600-400 MG-UNIT per tablet Take 1 tablet by mouth 2 (two) times daily.        . Cranberry 450 MG TABS Take 1 tablet by mouth 2 (two) times daily.       . cycloSPORINE (RESTASIS) 0.05 % ophthalmic emulsion Place 1 drop into both eyes 2 (two) times daily.        [name] docusate sodium (COLACE) 100 MG capsule Take 100 mg by mouth 2 (two) times daily as needed for constipation.       . gabapentin (NEURONTIN) 100 MG capsule Take 100 mg by mouth 3 (three) times daily.       . insulin aspart (NOVOLOG) 100 UNIT/ML  injection Inject 3 Units into the skin 3 (three) times daily before meals. 0-150=0; 151-200=2u; 201-250=3u; 251-300=4u; 301-350=5u; over 350 =6u prior to meals      . insulin glargine (LANTUS) 100 UNIT/ML injection Inject 16 Units into the skin at bedtime.       [name] loratadine (CLARITIN) 10 MG tablet Take 10 mg by mouth daily as needed for allergies.       . magnesium hydroxide (MILK OF MAGNESIA) 400 MG/5ML suspension Take 30 mLs by mouth daily as needed for constipation.      . meclizine (ANTIVERT) 25 MG tablet Take 25 mg by mouth 3 (three) times daily as needed for dizziness.       . metoprolol tartrate (LOPRESSOR) 25 MG tablet Take 25 mg by mouth 2 (two) times daily.      . Multiple Vitamins-Minerals (MULTIVITAMINS THER. W/MINERALS) TABS Take 1 tablet by mouth every morning.       . nitroGLYCERIN (NITROSTAT) 0.4 MG SL tablet Place 0.4 mg under the tongue every 5 (five) minutes as needed for chest pain.       [name] omeprazole (PRILOSEC) 20 MG capsule Take 20 mg by mouth 2 (two) times daily.        [name] oxybutynin (DITROPAN) 5 MG tablet Take 5 mg by mouth at bedtime.      [name]  polyethylene glycol (MIRALAX / GLYCOLAX) packet Take 17 g by mouth daily.  14 each  0  . potassium chloride (K-DUR) 10 MEQ tablet Take 1 tablet (10 mEq total) by mouth daily.  30 tablet  99  . pramoxine (SARNA SENSITIVE) 1 % LOTN Apply topically 2 times daily.      . sertraline (ZOLOFT) 100 MG tablet Take 200 mg by mouth at bedtime.       . simvastatin (ZOCOR) 40 MG tablet Take 40 mg by mouth at bedtime.      . sodium phosphate (FLEET) enema Place 1 enema rectally once as needed (for constipation not relieved by MOM or Bisacodyl Supp.). follow package directions      . traMADol (ULTRAM) 50 MG tablet Take 25 mg by mouth every 8 (eight) hours.      [name] warfarin (COUMADIN) 6 MG tablet Take 6 mg by mouth daily. Per INR       No current facility-administered medications for this visit.    Allergies  Allergen Reactions  . Albuterol  Sulfate Hfa (ZOX:[id number]) Shortness Of Breath  . Adhesive (Tape) Other (See Comments)    REACTION: SKIN BLISTERS  . Albuterol Swelling    Per pt., side of his neck began to swell with nebulized Albuterol in doctor's office.  . Contrast Media (Iodinated Diagnostic Agents)     X-ray dye  . Lactose     Other reaction(s): GI Upset (intolerance)  . Norvasc (Amlodipine Besylate)   . Penicillins Other (See Comments) and Itching    REACTION: ITCHING HANDS  . Sulfa Drugs Cross Reactors Hives  . Sulfamethoxazole Rash    History   Social History  . Marital Status: Married    Spouse Name: [name]    Number of Children: 3  . Years of Education: N/A   Occupational History  . Business owner, Airline pilot, retired    Social History Main Topics  . Smoking status: Former Games developer  . Smokeless tobacco: Never Used  . Alcohol Use: No  . Drug Use: No  . Sexually Active: No   Other Topics Concern  . Not on file   Social History Narrative   Married.  Lives with his wife.  Ambulates with a walker and a cane.     Review of Systems: General: negative for chills, fever, night sweats or weight changes.  Cardiovascular: negative for chest pain, dyspnea on exertion, edema, orthopnea, palpitations, paroxysmal nocturnal dyspnea or shortness of breath Dermatological: negative for rash Respiratory: negative for cough or wheezing Urologic: negative for hematuria Abdominal: negative for nausea, vomiting, diarrhea, bright red blood per rectum, melena, or hematemesis Neurologic: negative for visual changes, syncope, or dizziness All other systems reviewed and are otherwise negative except as noted above.    Blood pressure 124/60, pulse 60, weight 137 lb (62.143 kg).  General appearance: alert, appears older than stated age and mild distress Neck: no adenopathy, no carotid bruit, no JVD, supple, symmetrical, trachea midline and thyroid not enlarged, symmetric, no tenderness/mass/nodules Lungs: clear to  auscultation bilaterally Heart: regular rate and rhythm, S1, S2 normal, no murmur, click, rub or gallop Extremities: extremities normal, atraumatic, no cyanosis or edema  EKG performed today  ASSESSMENT AND PLAN:   CAD (coronary artery disease) Patient has a history of remote LAD stenting in 1996. I catheterized him 11/27/01 revealing a patent LAD stent with mild in-stent restenosis. His last Myoview performed 06/12/09 was nonischemic. He does get occasional left real chest pain but he does have rib fractures in  that area as well.      Runell Gess MD FACP,FACC,FAHA, [facility] 02/18/2013 3:01 PM

## 2013-02-18 NOTE — Assessment & Plan Note (Signed)
Patient has a history of remote LAD stenting in 1996. I catheterized him 11/27/01 revealing a patent LAD stent with mild in-stent restenosis. His last Myoview performed 06/12/09 was nonischemic. He does get occasional left real chest pain but he does have rib fractures in that area as well.

## 2013-02-18 NOTE — Telephone Encounter (Signed)
Need new prescription for Warfarin 6mg -Please call to [phone]-pt is there waiting!

## 2013-02-18 NOTE — Patient Instructions (Addendum)
Your physician wants you to follow-up in: 3 months with an extender and 6 months with Dr. Allyson Sabal.  You will receive a reminder letter in the mail two months in advance. If you don't receive a letter, please call our office to schedule the follow-up appointment.,r

## 2013-02-24 NOTE — Progress Notes (Signed)
Patient ID: Guy [name], male   DOB: 03-09-1920, 79 y.o.   MRN: [id number]        PROGRESS NOTE  DATE: 02/04/2013  FACILITY:  [facility]  LEVEL OF CARE: SNF (31)  Acute Visit  CHIEF COMPLAINT:  Manage anticoagulation and skin lesions.   HISTORY OF PRESENT ILLNESS: I was requested by the staff to assess the patient regarding above problem(s):  BILATERAL DVT:  Patient's DVT has remained stable.  He denies calf pain, chest pain, or shortness of breath.    ANTICOAGULATION:  Today, INR is 1.3.  Patient is on Coumadin 3 mg q.d. and Lovenox 70 mg q.12 hours.  He denies any bleeding.      SKIN LESIONS:  Patient's daughter requested me to assess his right anterior neck for new onset skin lesions.   Patient denies any pain or itching.    PAST MEDICAL HISTORY : Reviewed.  No changes.  CURRENT MEDICATIONS: Reviewed per [facility]  REVIEW OF SYSTEMS:  GENERAL: no change in appetite, no fatigue, no weight changes, no fever, chills or weakness RESPIRATORY: no cough, SOB, DOE,, wheezing, hemoptysis CARDIAC: no chest pain, edema or palpitations GI: no abdominal pain, diarrhea, constipation, heart burn, nausea or vomiting  PHYSICAL EXAMINATION  GENERAL: no acute distress, normal body habitus SKIN:  INSPECTION:  Right anterior neck has red lesions which are nontender to palpation.  The lesions are flat.  There is no open skin.     RESPIRATORY: breathing is even & unlabored, BS CTAB CARDIAC: RRR, no murmur,no extra heart sounds, no edema GI: abdomen soft, normal BS, no masses, no tenderness, no hepatomegaly, no splenomegaly PSYCHIATRIC: the patient is alert & oriented to person, affect & behavior appropriate  ASSESSMENT/PLAN:  Bilateral DVT.  Stable.   V58.61.  INR is subtherapeutic and unstable.  Increase Coumadin to 3.5 mg q.d. and check INR on 02/08/2013.    Skin lesions.  New problem.  Asymptomatic.  Assured patient and daughter.  Monitor for now.    CPT CODE:  40981

## 2013-02-26 ENCOUNTER — Ambulatory Visit: Payer: Medicare Other | Admitting: Pharmacist Clinician (PhC)/ Clinical Pharmacy Specialist

## 2013-03-16 ENCOUNTER — Ambulatory Visit (INDEPENDENT_AMBULATORY_CARE_PROVIDER_SITE_OTHER): Payer: Medicare Other | Admitting: Pharmacist Clinician (PhC)/ Clinical Pharmacy Specialist

## 2013-03-16 VITALS — BP 124/64 | HR 60

## 2013-03-16 DIAGNOSIS — I82409 Acute embolism and thrombosis of unspecified deep veins of unspecified lower extremity: Secondary | ICD-10-CM

## 2013-03-16 DIAGNOSIS — Z7901 Long term (current) use of anticoagulants: Secondary | ICD-10-CM

## 2013-03-16 DIAGNOSIS — I82403 Acute embolism and thrombosis of unspecified deep veins of lower extremity, bilateral: Secondary | ICD-10-CM

## 2013-03-20 ENCOUNTER — Encounter (HOSPITAL_COMMUNITY): Payer: Self-pay | Admitting: Emergency Medicine

## 2013-03-20 ENCOUNTER — Inpatient Hospital Stay (HOSPITAL_COMMUNITY)
Admission: EM | Admit: 2013-03-20 | Discharge: 2013-03-24 | DRG: 184 | Disposition: A | Discharge status: Skilled Nursing Facility | Payer: Medicare Other | Attending: Family Medicine | Admitting: Family Medicine

## 2013-03-20 ENCOUNTER — Inpatient Hospital Stay (HOSPITAL_COMMUNITY): Payer: Medicare Other

## 2013-03-20 ENCOUNTER — Emergency Department (HOSPITAL_COMMUNITY): Payer: Medicare Other

## 2013-03-20 DIAGNOSIS — W19XXXA Unspecified fall, initial encounter: Secondary | ICD-10-CM

## 2013-03-20 DIAGNOSIS — C61 Malignant neoplasm of prostate: Secondary | ICD-10-CM

## 2013-03-20 DIAGNOSIS — D689 Coagulation defect, unspecified: Secondary | ICD-10-CM

## 2013-03-20 DIAGNOSIS — I82403 Acute embolism and thrombosis of unspecified deep veins of lower extremity, bilateral: Secondary | ICD-10-CM

## 2013-03-20 DIAGNOSIS — Z7901 Long term (current) use of anticoagulants: Secondary | ICD-10-CM

## 2013-03-20 DIAGNOSIS — N3941 Urge incontinence: Secondary | ICD-10-CM | POA: Diagnosis present

## 2013-03-20 DIAGNOSIS — Z86718 Personal history of other venous thrombosis and embolism: Secondary | ICD-10-CM

## 2013-03-20 DIAGNOSIS — G929 Unspecified toxic encephalopathy: Secondary | ICD-10-CM

## 2013-03-20 DIAGNOSIS — Z8673 Personal history of transient ischemic attack (TIA), and cerebral infarction without residual deficits: Secondary | ICD-10-CM

## 2013-03-20 DIAGNOSIS — E871 Hypo-osmolality and hyponatremia: Secondary | ICD-10-CM

## 2013-03-20 DIAGNOSIS — G92 Toxic encephalopathy: Secondary | ICD-10-CM

## 2013-03-20 DIAGNOSIS — R6521 Severe sepsis with septic shock: Secondary | ICD-10-CM

## 2013-03-20 DIAGNOSIS — Z79899 Other long term (current) drug therapy: Secondary | ICD-10-CM

## 2013-03-20 DIAGNOSIS — D72829 Elevated white blood cell count, unspecified: Secondary | ICD-10-CM

## 2013-03-20 DIAGNOSIS — S2239XA Fracture of one rib, unspecified side, initial encounter for closed fracture: Secondary | ICD-10-CM

## 2013-03-20 DIAGNOSIS — G9340 Encephalopathy, unspecified: Secondary | ICD-10-CM

## 2013-03-20 DIAGNOSIS — R269 Unspecified abnormalities of gait and mobility: Secondary | ICD-10-CM | POA: Diagnosis present

## 2013-03-20 DIAGNOSIS — F329 Major depressive disorder, single episode, unspecified: Secondary | ICD-10-CM | POA: Diagnosis present

## 2013-03-20 DIAGNOSIS — R609 Edema, unspecified: Secondary | ICD-10-CM

## 2013-03-20 DIAGNOSIS — J9600 Acute respiratory failure, unspecified whether with hypoxia or hypercapnia: Secondary | ICD-10-CM

## 2013-03-20 DIAGNOSIS — I10 Essential (primary) hypertension: Secondary | ICD-10-CM

## 2013-03-20 DIAGNOSIS — R296 Repeated falls: Secondary | ICD-10-CM | POA: Diagnosis present

## 2013-03-20 DIAGNOSIS — Z8546 Personal history of malignant neoplasm of prostate: Secondary | ICD-10-CM

## 2013-03-20 DIAGNOSIS — Z87440 Personal history of urinary (tract) infections: Secondary | ICD-10-CM

## 2013-03-20 DIAGNOSIS — I2510 Atherosclerotic heart disease of native coronary artery without angina pectoris: Secondary | ICD-10-CM

## 2013-03-20 DIAGNOSIS — A419 Sepsis, unspecified organism: Secondary | ICD-10-CM

## 2013-03-20 DIAGNOSIS — S2232XA Fracture of one rib, left side, initial encounter for closed fracture: Secondary | ICD-10-CM

## 2013-03-20 DIAGNOSIS — J690 Pneumonitis due to inhalation of food and vomit: Secondary | ICD-10-CM

## 2013-03-20 DIAGNOSIS — E119 Type 2 diabetes mellitus without complications: Secondary | ICD-10-CM

## 2013-03-20 DIAGNOSIS — R52 Pain, unspecified: Secondary | ICD-10-CM

## 2013-03-20 DIAGNOSIS — K219 Gastro-esophageal reflux disease without esophagitis: Secondary | ICD-10-CM | POA: Diagnosis present

## 2013-03-20 DIAGNOSIS — D649 Anemia, unspecified: Secondary | ICD-10-CM

## 2013-03-20 DIAGNOSIS — R188 Other ascites: Secondary | ICD-10-CM

## 2013-03-20 DIAGNOSIS — Z8551 Personal history of malignant neoplasm of bladder: Secondary | ICD-10-CM

## 2013-03-20 DIAGNOSIS — K5900 Constipation, unspecified: Secondary | ICD-10-CM

## 2013-03-20 DIAGNOSIS — R2681 Unsteadiness on feet: Secondary | ICD-10-CM

## 2013-03-20 DIAGNOSIS — E860 Dehydration: Secondary | ICD-10-CM

## 2013-03-20 DIAGNOSIS — R0600 Dyspnea, unspecified: Secondary | ICD-10-CM

## 2013-03-20 DIAGNOSIS — Z87891 Personal history of nicotine dependence: Secondary | ICD-10-CM

## 2013-03-20 DIAGNOSIS — S2249XA Multiple fractures of ribs, unspecified side, initial encounter for closed fracture: Secondary | ICD-10-CM

## 2013-03-20 DIAGNOSIS — R634 Abnormal weight loss: Secondary | ICD-10-CM

## 2013-03-20 DIAGNOSIS — I82509 Chronic embolism and thrombosis of unspecified deep veins of unspecified lower extremity: Secondary | ICD-10-CM | POA: Diagnosis present

## 2013-03-20 DIAGNOSIS — Y92009 Unspecified place in unspecified non-institutional (private) residence as the place of occurrence of the external cause: Secondary | ICD-10-CM

## 2013-03-20 DIAGNOSIS — E785 Hyperlipidemia, unspecified: Secondary | ICD-10-CM

## 2013-03-20 DIAGNOSIS — N179 Acute kidney failure, unspecified: Secondary | ICD-10-CM

## 2013-03-20 DIAGNOSIS — Z880 Allergy status to penicillin: Secondary | ICD-10-CM

## 2013-03-20 DIAGNOSIS — D638 Anemia in other chronic diseases classified elsewhere: Secondary | ICD-10-CM | POA: Diagnosis present

## 2013-03-20 DIAGNOSIS — F3289 Other specified depressive episodes: Secondary | ICD-10-CM | POA: Diagnosis present

## 2013-03-20 DIAGNOSIS — Z794 Long term (current) use of insulin: Secondary | ICD-10-CM

## 2013-03-20 LAB — CBC WITH DIFFERENTIAL/PLATELET
Lymphocytes Relative: 16 % (ref 12–46)
Lymphs Abs: 0.8 10*3/uL (ref 0.7–4.0)
MCV: 92.8 fL (ref 78.0–100.0)
Neutro Abs: 3.4 10*3/uL (ref 1.7–7.7)
Neutrophils Relative %: 71 % (ref 43–77)
Platelets: 127 10*3/uL — ABNORMAL LOW (ref 150–400)
RBC: 3.06 MIL/uL — ABNORMAL LOW (ref 4.22–5.81)
WBC: 4.8 10*3/uL (ref 4.0–10.5)

## 2013-03-20 LAB — BASIC METABOLIC PANEL
CO2: 26 mEq/L (ref 19–32)
Chloride: 98 mEq/L (ref 96–112)
Glucose, Bld: 275 mg/dL — ABNORMAL HIGH (ref 70–99)
Potassium: 4.1 mEq/L (ref 3.5–5.1)
Sodium: 131 mEq/L — ABNORMAL LOW (ref 135–145)

## 2013-03-20 LAB — URINALYSIS, ROUTINE W REFLEX MICROSCOPIC
Nitrite: NEGATIVE
Protein, ur: 30 mg/dL — AB
Specific Gravity, Urine: 1.02 (ref 1.005–1.030)
Urobilinogen, UA: 0.2 mg/dL (ref 0.0–1.0)

## 2013-03-20 LAB — HEPATIC FUNCTION PANEL
Albumin: 2.5 g/dL — ABNORMAL LOW (ref 3.5–5.2)
Indirect Bilirubin: 0.2 mg/dL — ABNORMAL LOW (ref 0.3–0.9)
Total Protein: 7.3 g/dL (ref 6.0–8.3)

## 2013-03-20 LAB — URINE MICROSCOPIC-ADD ON

## 2013-03-20 LAB — APTT: aPTT: 41 seconds — ABNORMAL HIGH (ref 24–37)

## 2013-03-20 LAB — GLUCOSE, CAPILLARY: Glucose-Capillary: 182 mg/dL — ABNORMAL HIGH (ref 70–99)

## 2013-03-20 LAB — PROTIME-INR
INR: 1.5 — ABNORMAL HIGH (ref 0.00–1.49)
Prothrombin Time: 17.7 seconds — ABNORMAL HIGH (ref 11.6–15.2)

## 2013-03-20 LAB — TROPONIN I
Troponin I: <0.3 ng/mL (ref <0.30)
Troponin I: <0.3 ng/mL (ref <0.30)

## 2013-03-20 MED ORDER — CYCLOSPORINE 0.05 % OP EMUL
1.0000 [drp] | Freq: Two times a day (BID) | OPHTHALMIC | Status: DC
  Administered 2013-03-20 – 2013-03-24 (×8): 1 [drp] via OPHTHALMIC
  Filled 2013-03-20 (×9): qty 1

## 2013-03-20 MED ORDER — MORPHINE SULFATE 4 MG/ML IJ SOLN
4.0000 mg | Freq: Once | INTRAMUSCULAR | Status: AC
  Administered 2013-03-20: 4 mg via INTRAVENOUS
  Filled 2013-03-20: qty 1

## 2013-03-20 MED ORDER — ONDANSETRON HCL 4 MG PO TABS: 4.0000 mg | ORAL_TABLET | Freq: Four times a day (QID) | ORAL | Status: DC | PRN

## 2013-03-20 MED ORDER — SODIUM CHLORIDE 0.9 % IJ SOLN: 3.0000 mL | Freq: Two times a day (BID) | INTRAMUSCULAR | Status: DC

## 2013-03-20 MED ORDER — POLYETHYLENE GLYCOL 3350 17 G PO PACK
17.0000 g | PACK | Freq: Every day | ORAL | Status: DC
  Administered 2013-03-20 – 2013-03-24 (×5): 17 g via ORAL
  Filled 2013-03-20 (×5): qty 1

## 2013-03-20 MED ORDER — INSULIN ASPART 100 UNIT/ML ~~LOC~~ SOLN: 3.0000 [IU] | Freq: Three times a day (TID) | SUBCUTANEOUS | Status: DC

## 2013-03-20 MED ORDER — SIMVASTATIN 40 MG PO TABS
40.0000 mg | ORAL_TABLET | Freq: Every day | ORAL | Status: DC
  Administered 2013-03-20 – 2013-03-23 (×4): 40 mg via ORAL
  Filled 2013-03-20 (×5): qty 1

## 2013-03-20 MED ORDER — PANTOPRAZOLE SODIUM 40 MG PO TBEC
40.0000 mg | DELAYED_RELEASE_TABLET | Freq: Every day | ORAL | Status: DC
  Administered 2013-03-20 – 2013-03-24 (×5): 40 mg via ORAL
  Filled 2013-03-20 (×6): qty 1

## 2013-03-20 MED ORDER — OXYBUTYNIN CHLORIDE 5 MG PO TABS
5.0000 mg | ORAL_TABLET | Freq: Every day | ORAL | Status: DC
  Administered 2013-03-20 – 2013-03-23 (×4): 5 mg via ORAL
  Filled 2013-03-20 (×5): qty 1

## 2013-03-20 MED ORDER — ENOXAPARIN SODIUM 40 MG/0.4ML ~~LOC~~ SOLN
40.0000 mg | SUBCUTANEOUS | Status: DC
  Administered 2013-03-20 – 2013-03-23 (×4): 40 mg via SUBCUTANEOUS
  Filled 2013-03-20 (×5): qty 0.4

## 2013-03-20 MED ORDER — METHOCARBAMOL 500 MG PO TABS
500.0000 mg | ORAL_TABLET | Freq: Once | ORAL | Status: AC
  Administered 2013-03-20: 500 mg via ORAL
  Filled 2013-03-20: qty 1

## 2013-03-20 MED ORDER — INSULIN GLARGINE 100 UNIT/ML ~~LOC~~ SOLN
16.0000 [IU] | Freq: Every day | SUBCUTANEOUS | Status: DC
  Administered 2013-03-20 – 2013-03-23 (×4): 16 [IU] via SUBCUTANEOUS
  Filled 2013-03-20 (×5): qty 0.16

## 2013-03-20 MED ORDER — INSULIN ASPART 100 UNIT/ML ~~LOC~~ SOLN
0.0000 [IU] | Freq: Three times a day (TID) | SUBCUTANEOUS | Status: DC
  Administered 2013-03-22 – 2013-03-23 (×2): 2 [IU] via SUBCUTANEOUS

## 2013-03-20 MED ORDER — LEVOFLOXACIN IN D5W 750 MG/150ML IV SOLN
750.0000 mg | INTRAVENOUS | Status: DC
  Administered 2013-03-20: 750 mg via INTRAVENOUS
  Filled 2013-03-20: qty 150

## 2013-03-20 MED ORDER — ONDANSETRON HCL 4 MG/2ML IJ SOLN: 4.0000 mg | Freq: Four times a day (QID) | INTRAMUSCULAR | Status: DC | PRN

## 2013-03-20 MED ORDER — ACETAMINOPHEN 650 MG RE SUPP: 650.0000 mg | Freq: Four times a day (QID) | RECTAL | Status: DC | PRN

## 2013-03-20 MED ORDER — SODIUM CHLORIDE 0.9 % IV SOLN
INTRAVENOUS | Status: DC
  Administered 2013-03-20: 18:00:00 via INTRAVENOUS

## 2013-03-20 MED ORDER — SODIUM CHLORIDE 0.9 % IV SOLN
Freq: Once | INTRAVENOUS | Status: AC
  Administered 2013-03-20: 14:00:00 via INTRAVENOUS

## 2013-03-20 MED ORDER — SERTRALINE HCL 100 MG PO TABS
200.0000 mg | ORAL_TABLET | Freq: Every day | ORAL | Status: DC
  Administered 2013-03-20 – 2013-03-23 (×4): 200 mg via ORAL
  Filled 2013-03-20 (×5): qty 2

## 2013-03-20 MED ORDER — BICALUTAMIDE 50 MG PO TABS
50.0000 mg | ORAL_TABLET | Freq: Every day | ORAL | Status: DC
  Administered 2013-03-20 – 2013-03-24 (×5): 50 mg via ORAL
  Filled 2013-03-20 (×5): qty 1

## 2013-03-20 MED ORDER — ACETAMINOPHEN 325 MG PO TABS: 650.0000 mg | ORAL_TABLET | Freq: Four times a day (QID) | ORAL | Status: DC | PRN

## 2013-03-20 MED ORDER — MORPHINE SULFATE 2 MG/ML IJ SOLN
1.0000 mg | INTRAMUSCULAR | Status: DC | PRN
  Administered 2013-03-21 (×2): 1 mg via INTRAVENOUS
  Filled 2013-03-20 (×3): qty 1

## 2013-03-20 MED ORDER — METOPROLOL TARTRATE 25 MG PO TABS
25.0000 mg | ORAL_TABLET | Freq: Two times a day (BID) | ORAL | Status: DC
  Administered 2013-03-20 – 2013-03-24 (×8): 25 mg via ORAL
  Filled 2013-03-20 (×9): qty 1

## 2013-03-20 NOTE — Progress Notes (Signed)
ANTIBIOTIC CONSULT NOTE - INITIAL  Pharmacy Consult for Levaquin Indication: PNA/UTI  Allergies  Allergen Reactions  . Albuterol Sulfate Hfa (ZOX:[id number]) Shortness Of Breath and Swelling  . Adhesive (Tape) Other (See Comments)    REACTION: SKIN BLISTERS  . Albuterol Swelling    Per pt., side of his neck began to swell with nebulized Albuterol in doctor's office.  . Contrast Media (Iodinated Diagnostic Agents) Other (See Comments)    Reaction unknown  . Lactose     Other reaction(s): GI Upset (intolerance)  . Norvasc (Amlodipine Besylate) Other (See Comments)    Reaction unknown  . Penicillins Other (See Comments) and Itching    REACTION: ITCHING HANDS  . Sulfa Drugs Cross Reactors Hives  . Sulfamethoxazole Rash    Patient Measurements:     Vital Signs: Temp: 100.4 F (38 C) (06/21 1505) Temp src: Rectal (06/21 1505) BP: 149/88 mmHg (06/21 1709) Pulse Rate: 60 (06/21 1709) Intake/Output from previous day:   Intake/Output from this shift:    Labs:  Recent Labs  03/20/13 1400  WBC 4.8  HGB 9.2*  PLT 127*  CREATININE 1.32   The CrCl is unknown because both a height and weight (above a minimum accepted value) are required for this calculation.    Microbiology: No results found for this or any previous visit (from the past 720 hour(s)).  Medical History: Past Medical History  Diagnosis Date  . Hypertension   . Diabetes mellitus without complication   . Arthritis   . Sciatica   . GERD (gastroesophageal reflux disease)   . Urge incontinence   . Vertigo   . Hyperlipidemia   . ED (erectile dysfunction)   . Gastric polyposis   . Osteoporosis   . Decreased libido   . Depression   . Hepatitis A   . Stroke   . History of blood transfusion     1946  . OA (osteoarthritis)   . Bladder cancer   . Prostate cancer     S/P prostatectomy; Lung metastasis  . CAD (coronary artery disease) 1996    stent to the LAD   . Hyperlipidemia   . Type 2 diabetes  mellitus     Medications:  Anti-infectives   Start     Dose/Rate Route Frequency Ordered Stop   03/20/13 1800  levofloxacin (LEVAQUIN) IVPB 750 mg     750 mg 100 mL/hr over 90 Minutes Intravenous Every 48 hours 03/20/13 1721       Assessment: 89 yom admitted after fall, shoulder, head and chest injury. Pharmacy asked to dose Levaquin for UTI and PNA  Scr 1.32, CrCl ~75ml/min  WBC wnl, Tm 100.4  Urine cx pending  CXR pending  Goal of Therapy:  Appropriate dose of Levaquin  Plan:  Levaquin 750mg  IV q48h Follow labs  Adjust dose as necessary  Gwen Her PharmD  [phone] 03/20/2013 5:31 PM

## 2013-03-20 NOTE — ED Provider Notes (Addendum)
History     CSN: [id number]  Arrival date & time 03/20/13  1305   First MD Initiated Contact with Patient 03/20/13 1321      Chief Complaint  Patient presents with  . Shoulder Pain  . Head Injury  . Chest Injury    (Consider location/radiation/quality/duration/timing/severity/associated sxs/prior treatment) HPI Comments: Pt comes in with cc of fall. Pt is on coumadin. PT states that he got up from the chair to get a napkin, and got dizzy- fell backwards. He hit his head, and fell on to the left side. He does have a mild headache, but denies nausea, vomiting, visual complains, seizures, altered mental status, loss of consciousness, new weakness, or numbness, no gait instability. No new neck pain. Pt also has left sided poster rib pain and elbow pain.     Patient is a 79 y.o. male presenting with shoulder pain and head injury. The history is provided by the patient.  Shoulder Pain Pertinent negatives include no chest pain, no abdominal pain, no headaches and no shortness of breath.  Head Injury Associated symptoms: no headaches and no seizures     Past Medical History  Diagnosis Date  . Hypertension   . Diabetes mellitus without complication   . Arthritis   . Sciatica   . GERD (gastroesophageal reflux disease)   . Urge incontinence   . Vertigo   . Hyperlipidemia   . ED (erectile dysfunction)   . Gastric polyposis   . Osteoporosis   . Decreased libido   . Depression   . Hepatitis A   . Stroke   . History of blood transfusion     1946  . OA (osteoarthritis)   . Bladder cancer   . Prostate cancer     S/P prostatectomy; Lung metastasis  . CAD (coronary artery disease) 1996    stent to the LAD   . Hyperlipidemia   . Type 2 diabetes mellitus     Past Surgical History  Procedure Laterality Date  . Carpal tunnel release    . Cataract extraction w/ intraocular lens  implant, bilateral  1998  . Prostatectomy    . Urinary sphincter implant    . Urinary sphincter  implant revision    . Appendectomy    . Left hip surgery      Donated bone for bone graft to arm  . Penile prosthesis implant      S/P removal and re-implantation of new prosthesis  . Middle ear surgery    . Finger surgery      Left and right  . Hernia repair    . Bladder surgery    . Right arm bone graft      Pathological fracture  . Cardiac catheterization  11/27/2001    patent stent with mild in-stent restenosis.  . Myoview  06/12/2009    Persantine myoview EF 76%; Normal myoview  . Coronary angioplasty  1996    stenting to the LAD in [place].     Family History  Problem Relation Age of Onset  . Heart failure Mother   . Bladder Cancer Father   . Pancreatic cancer Sister   . Lung cancer Sister   . Breast cancer Daughter   . Liver disease Son     History  Substance Use Topics  . Smoking status: Former Games developer  . Smokeless tobacco: Never Used  . Alcohol Use: No      Review of Systems  Constitutional: Negative for fever, chills and activity change.  HENT: Negative for neck stiffness.   Eyes: Negative for visual disturbance.  Respiratory: Negative for cough, chest tightness and shortness of breath.   Cardiovascular: Negative for chest pain.  Gastrointestinal: Negative for abdominal pain and abdominal distention.  Genitourinary: Negative for dysuria, enuresis and difficulty urinating.  Musculoskeletal: Positive for back pain and arthralgias.  Skin: Negative for rash and wound.  Neurological: Positive for dizziness and light-headedness. Negative for seizures, syncope and headaches.  Psychiatric/Behavioral: Negative for confusion.    Allergies  Albuterol sulfate hfa; Adhesive; Albuterol; Contrast media; Lactose; Norvasc; Penicillins; Sulfa drugs cross reactors; and Sulfamethoxazole  Home Medications   Current Outpatient Rx  Name  Route  Sig  Dispense  Refill  . acetaminophen (TYLENOL) 325 MG tablet   Oral   Take 325 mg by mouth daily.          .  bicalutamide (CASODEX) 50 MG tablet   Oral   Take 50 mg by mouth daily.         . Biotin 5 MG TABS   Oral   Take 5 mg by mouth daily.         . Calcium Carbonate-Vitamin D (CALCIUM 600+D HIGH POTENCY) 600-400 MG-UNIT per tablet   Oral   Take 1 tablet by mouth 2 (two) times daily.           . Cranberry 450 MG TABS   Oral   Take 1 tablet by mouth 2 (two) times daily.          . cycloSPORINE (RESTASIS) 0.05 % ophthalmic emulsion   Both Eyes   Place 1 drop into both eyes 2 (two) times daily.           [name] docusate sodium (COLACE) 100 MG capsule   Oral   Take 100 mg by mouth 2 (two) times daily as needed for constipation.          . gabapentin (NEURONTIN) 100 MG capsule   Oral   Take 100 mg by mouth 3 (three) times daily.          . insulin aspart (NOVOLOG) 100 UNIT/ML injection   Subcutaneous   Inject 3 Units into the skin 3 (three) times daily before meals. 0-150=0; 151-200=2u; 201-250=3u; 251-300=4u; 301-350=5u; over 350 =6u prior to meals         . insulin glargine (LANTUS) 100 UNIT/ML injection   Subcutaneous   Inject 16 Units into the skin at bedtime.          [name] loratadine (CLARITIN) 10 MG tablet   Oral   Take 10 mg by mouth daily as needed for allergies.          . magnesium hydroxide (MILK OF MAGNESIA) 400 MG/5ML suspension   Oral   Take 30 mLs by mouth daily as needed for constipation.         . meclizine (ANTIVERT) 25 MG tablet   Oral   Take 25 mg by mouth 3 (three) times daily as needed for dizziness.          . metoprolol tartrate (LOPRESSOR) 25 MG tablet   Oral   Take 25 mg by mouth 2 (two) times daily.         . Multiple Vitamins-Minerals (MULTIVITAMINS THER. W/MINERALS) TABS   Oral   Take 1 tablet by mouth every morning.          . nitroGLYCERIN (NITROSTAT) 0.4 MG SL tablet   Sublingual   Place 0.4 mg under the tongue every  5 (five) minutes as needed for chest pain.          [name] omeprazole (PRILOSEC) 20 MG capsule    Oral   Take 20 mg by mouth 2 (two) times daily.           [name] oxybutynin (DITROPAN) 5 MG tablet   Oral   Take 5 mg by mouth at bedtime.         . polyethylene glycol (MIRALAX / GLYCOLAX) packet   Oral   Take 17 g by mouth daily.   14 each   0   . potassium chloride (K-DUR) 10 MEQ tablet   Oral   Take 1 tablet (10 mEq total) by mouth daily.   30 tablet   99   . pramoxine (SARNA SENSITIVE) 1 % LOTN      Apply topically 2 times daily.         . sertraline (ZOLOFT) 100 MG tablet   Oral   Take 200 mg by mouth at bedtime.          . simvastatin (ZOCOR) 40 MG tablet   Oral   Take 40 mg by mouth at bedtime.         . sodium phosphate (FLEET) enema   Rectal   Place 1 enema rectally once as needed (for constipation not relieved by MOM or Bisacodyl Supp.). follow package directions         . traMADol (ULTRAM) 50 MG tablet   Oral   Take 25 mg by mouth every 8 (eight) hours.         [name] warfarin (COUMADIN) 6 MG tablet   Oral   Take 1 tablet (6 mg total) by mouth daily. Per INR   30 tablet   3     BP 150/70  Pulse 52  Temp(Src) 98.5 F (36.9 C) (Oral)  SpO2 96%  Physical Exam  Nursing note and vitals reviewed. Constitutional: He is oriented to person, place, and time. He appears well-developed.  HENT:  Head: Normocephalic and atraumatic.  Eyes: Conjunctivae and EOM are normal. Pupils are equal, round, and reactive to light.  Neck: Normal range of motion. Neck supple.  No midline c-spine tenderness, pt able to turn head to 45 degrees bilaterally without any pain and able to flex neck to the chest and extend without any pain or neurologic symptoms.   Cardiovascular: Normal rate and regular rhythm.   Pulmonary/Chest: Effort normal and breath sounds normal.  Abdominal: Soft. Bowel sounds are normal. He exhibits no distension. There is no tenderness. There is no rebound and no guarding.  Musculoskeletal: He exhibits no edema and no tenderness.  Pt has left  sided rib tenderness with palpation.    Head to toe evaluation shows no hematoma, bleeding of the scalp, no facial abrasions, step offs, crepitus, no tenderness to palpation of the bilateral upper and lower extremities, no gross deformities, no chest tenderness, no pelvic pain.    Neurological: He is alert and oriented to person, place, and time.  Skin: Skin is warm.    ED Course  Procedures (including critical care time)  Labs Reviewed  URINE CULTURE  CBC WITH DIFFERENTIAL  BASIC METABOLIC PANEL  PROTIME-INR  APTT  URINALYSIS, ROUTINE W REFLEX MICROSCOPIC   No results found.   No diagnosis found.    MDM  DDx includes: - Mechanical falls - ICH - Fractures - Contusions - Soft tissue injury  PT comes in post fall. Appears to be a result of  orthostatic hypotension. We will get hydration. Pt has pain over the ribs, and is a 79 y/o on coumadin with fall - so will need dedicated xrays of the ribs and pelvis and CT head to r/o bleed.  Derwood Kaplan, MD 03/20/13 1410   4:46 PM Pt not steady. On coumadin, has rib fractures, will admit for pain control and possible PT evaluation.  Derwood Kaplan, MD 03/20/13 1646

## 2013-03-20 NOTE — ED Notes (Signed)
Pt from home, reports that he got up to get some napkins, and fell backwards hitting both shoulders, head and L ribs. Pt reports that he doesn't know if he got dizzy because he falls a lot. Pt and wife deny LOC. Pt states that he has had several rib fractures since 2010 d/t osteoporosis. Pt is A&Ox4 and in NAD. Pt is on coumadin for hx of blood clots.

## 2013-03-20 NOTE — ED Notes (Signed)
Patient complaining of left arm/shoulder pain along with left flank.

## 2013-03-20 NOTE — H&P (Signed)
[facility] History and Physical  Guy [name]:[id number] DOB: 11-03-1919 DOA: 03/20/2013  Referring physician: *ER PCP: Junious Silk, MD   Chief Complaint:  Fall  HPI:  80 year old male admitted in April for back pain, had an L3 kyphoplasty, MRSA pneumonia, Klebsiella UTI, status post intubation for one day on 4/1, discharge to SNF on 5/12, independent at home with a walker, with some difficulty. Who presents with a fall. The history is provided by the wife is currently present by the bedside. The patient usually loses balance with his walker and falls backwards. She however states that he has not had any falls since he was discharged from [facility], 5/12. Patient brought to the last his balance and fell there was no loss of consciousness. Patient was able to get up at back in the chair and finish his lunch before coming to the hospital. He has been on Coumadin for 3 months for a blood clot in his leg      Review of Systems: negative for the following  Constitutional: Denies fever, chills, diaphoresis, appetite change and fatigue.  HEENT: Denies photophobia, eye pain, redness, hearing loss, ear pain, congestion, sore throat, rhinorrhea, sneezing, mouth sores, trouble swallowing, neck pain, neck stiffness and tinnitus.  Respiratory: Denies SOB, DOE, cough, chest tightness, and wheezing.  Cardiovascular: Denies chest pain, palpitations and leg swelling.  Gastrointestinal: Denies nausea, vomiting, abdominal pain, diarrhea, constipation, blood in stool and abdominal distention.  Genitourinary: Denies dysuria, urgency, frequency, hematuria, flank pain and difficulty urinating.  Musculoskeletal: Positive for back pain and arthralgias.  Skin: Negative for rash and wound.  Neurological: Positive for dizziness and light-headedness. Negative for seizures, syncope and headaches.  Psychiatric/Behavioral: Negative for confusion.   light-headedness, numbness and headaches.   Hematological: Denies adenopathy. Easy bruising, personal or family bleeding history  Psychiatric/Behavioral: Denies suicidal ideation, mood changes, confusion, nervousness, sleep disturbance and agitation       Past Medical History  Diagnosis Date  . Hypertension   . Diabetes mellitus without complication   . Arthritis   . Sciatica   . GERD (gastroesophageal reflux disease)   . Urge incontinence   . Vertigo   . Hyperlipidemia   . ED (erectile dysfunction)   . Gastric polyposis   . Osteoporosis   . Decreased libido   . Depression   . Hepatitis A   . Stroke   . History of blood transfusion     1946  . OA (osteoarthritis)   . Bladder cancer   . Prostate cancer     S/P prostatectomy; Lung metastasis  . CAD (coronary artery disease) 1996    stent to the LAD   . Hyperlipidemia   . Type 2 diabetes mellitus      Past Surgical History  Procedure Laterality Date  . Carpal tunnel release    . Cataract extraction w/ intraocular lens  implant, bilateral  1998  . Prostatectomy    . Urinary sphincter implant    . Urinary sphincter implant revision    . Appendectomy    . Left hip surgery      Donated bone for bone graft to arm  . Penile prosthesis implant      S/P removal and re-implantation of new prosthesis  . Middle ear surgery    . Finger surgery      Left and right  . Hernia repair    . Bladder surgery    . Right arm bone graft      Pathological fracture  .  Cardiac catheterization  11/27/2001    patent stent with mild in-stent restenosis.  . Myoview  06/12/2009    Persantine myoview EF 76%; Normal myoview  . Coronary angioplasty  1996    stenting to the LAD in [place].       Social History:  reports that he has quit smoking. He has never used smokeless tobacco. He reports that he does not drink alcohol or use illicit drugs.    Allergies  Allergen Reactions  . Albuterol Sulfate Hfa ([id number]) Shortness Of Breath and Swelling  . Adhesive (Tape)  Other (See Comments)    REACTION: SKIN BLISTERS  . Albuterol Swelling    Per pt., side of his neck began to swell with nebulized Albuterol in doctor's office.  . Contrast Media (Iodinated Diagnostic Agents) Other (See Comments)    Reaction unknown  . Lactose     Other reaction(s): GI Upset (intolerance)  . Norvasc (Amlodipine Besylate) Other (See Comments)    Reaction unknown  . Penicillins Other (See Comments) and Itching    REACTION: ITCHING HANDS  . Sulfa Drugs Cross Reactors Hives  . Sulfamethoxazole Rash    Family History  Problem Relation Age of Onset  . Heart failure Mother   . Bladder Cancer Father   . Pancreatic cancer Sister   . Lung cancer Sister   . Breast cancer Daughter   . Liver disease Son      Prior to Admission medications   Medication Sig Start Date End Date Taking? Authorizing Provider  acetaminophen (TYLENOL) 325 MG tablet Take 325 mg by mouth daily.     Historical Provider, MD  bicalutamide (CASODEX) 50 MG tablet Take 50 mg by mouth daily.    Historical Provider, MD  Biotin 5 MG TABS Take 5 mg by mouth daily.    Historical Provider, MD  Calcium Carbonate-Vitamin D (CALCIUM 600+D HIGH POTENCY) 600-400 MG-UNIT per tablet Take 1 tablet by mouth 2 (two) times daily.      Historical Provider, MD  Cranberry 450 MG TABS Take 1 tablet by mouth 2 (two) times daily.     Historical Provider, MD  cycloSPORINE (RESTASIS) 0.05 % ophthalmic emulsion Place 1 drop into both eyes 2 (two) times daily.      Historical Provider, MD  docusate sodium (COLACE) 100 MG capsule Take 100 mg by mouth 2 (two) times daily as needed for constipation.     Historical Provider, MD  gabapentin (NEURONTIN) 100 MG capsule Take 100 mg by mouth 3 (three) times daily.     Historical Provider, MD  insulin aspart (NOVOLOG) 100 UNIT/ML injection Inject 3 Units into the skin 3 (three) times daily before meals. 0-150=0; 151-200=2u; 201-250=3u; 251-300=4u; 301-350=5u; over 350 =6u prior to meals 01/18/13    Sharee Holster, NP  insulin glargine (LANTUS) 100 UNIT/ML injection Inject 16 Units into the skin at bedtime.     Historical Provider, MD  loratadine (CLARITIN) 10 MG tablet Take 10 mg by mouth daily as needed for allergies.     Historical Provider, MD  magnesium hydroxide (MILK OF MAGNESIA) 400 MG/5ML suspension Take 30 mLs by mouth daily as needed for constipation.    Historical Provider, MD  meclizine (ANTIVERT) 25 MG tablet Take 25 mg by mouth 3 (three) times daily as needed for dizziness.     Historical Provider, MD  metoprolol tartrate (LOPRESSOR) 25 MG tablet Take 25 mg by mouth 2 (two) times daily.    Historical Provider, MD  Multiple Vitamins-Minerals (MULTIVITAMINS THER.  W/MINERALS) TABS Take 1 tablet by mouth every morning.     Historical Provider, MD  nitroGLYCERIN (NITROSTAT) 0.4 MG SL tablet Place 0.4 mg under the tongue every 5 (five) minutes as needed for chest pain.     Historical Provider, MD  omeprazole (PRILOSEC) 20 MG capsule Take 20 mg by mouth 2 (two) times daily.      Historical Provider, MD  oxybutynin (DITROPAN) 5 MG tablet Take 5 mg by mouth at bedtime.    Historical Provider, MD  polyethylene glycol (MIRALAX / GLYCOLAX) packet Take 17 g by mouth daily. 11/11/12   Amber Nydia Bouton, MD  potassium chloride (K-DUR) 10 MEQ tablet Take 1 tablet (10 mEq total) by mouth daily. 12/28/12   Sharee Holster, NP  pramoxine (SARNA SENSITIVE) 1 % LOTN Apply topically 2 times daily.    Historical Provider, MD  sertraline (ZOLOFT) 100 MG tablet Take 200 mg by mouth at bedtime.     Historical Provider, MD  simvastatin (ZOCOR) 40 MG tablet Take 40 mg by mouth at bedtime.    Historical Provider, MD  sodium phosphate (FLEET) enema Place 1 enema rectally once as needed (for constipation not relieved by MOM or Bisacodyl Supp.). follow package directions    Historical Provider, MD  traMADol (ULTRAM) 50 MG tablet Take 25 mg by mouth every 8 (eight) hours.    Historical Provider, MD  warfarin  (COUMADIN) 3 MG tablet Take 3 mg by mouth daily.    Historical Provider, MD     Physical Exam: Filed Vitals:   03/20/13 1616 03/20/13 1709 03/20/13 1757 03/20/13 1817  BP: 184/59 149/88  172/79  Pulse: 61 60  61  Temp:   98.2 F (36.8 C) 98.2 F (36.8 C)  TempSrc:   Oral Oral  Resp: 24 27  20   Height:    5\' 6"  (1.676 m)  Weight:    62.324 kg (137 lb 6.4 oz)  SpO2:    96%     Constitutional: Vital signs reviewed. Patient is a well-developed and well-nourished in no acute distress and cooperative with exam. Alert and oriented x3.  Head: Normocephalic and atraumatic  Ear: TM normal bilaterally  Mouth: no erythema or exudates, MMM  Eyes: PERRL, EOMI, conjunctivae normal, No scleral icterus.  Neck: Supple, Trachea midline normal ROM, No JVD, mass, thyromegaly, or carotid bruit present.  Cardiovascular: RRR, S1 normal, S2 normal, no MRG, pulses symmetric and intact bilaterally  Pulmonary/Chest: CTAB, no wheezes, rales, or rhonchi  Abdominal: Soft. Non-tender, non-distended, bowel sounds are normal, no masses, organomegaly, or guarding present.  GU: no CVA tenderness Musculoskeletal: No joint deformities, erythema, or stiffness, ROM full and no nontender Ext: no edema and no cyanosis, pulses palpable bilaterally (DP and PT)  Hematology: no cervical, inginal, or axillary adenopathy.  Neurological: A&O x3, Strenght is normal and symmetric bilaterally, cranial nerve II-XII are grossly intact, no focal motor deficit, sensory intact to light touch bilaterally.  Skin: Warm, dry and intact. No rash, cyanosis, or clubbing.  Psychiatric: Normal mood and affect. speech and behavior is normal. Judgment and thought content normal. Cognition and memory are normal.       Labs on Admission:    Basic Metabolic Panel:  Recent Labs Lab 03/20/13 1400  NA 131*  K 4.1  CL 98  CO2 26  GLUCOSE 275*  BUN 19  CREATININE 1.32  CALCIUM 8.7   Liver Function Tests: No results found for this  basename: AST, ALT, ALKPHOS, BILITOT, PROT, ALBUMIN,  in the last 168 hours No results found for this basename: LIPASE, AMYLASE,  in the last 168 hours No results found for this basename: AMMONIA,  in the last 168 hours CBC:  Recent Labs Lab 03/20/13 1400  WBC 4.8  NEUTROABS 3.4  HGB 9.2*  HCT 28.4*  MCV 92.8  PLT 127*   Cardiac Enzymes: No results found for this basename: CKTOTAL, CKMB, CKMBINDEX, TROPONINI,  in the last 168 hours  BNP (last 3 results) No results found for this basename: PROBNP,  in the last 8760 hours    CBG:  Recent Labs Lab 03/20/13 1750  GLUCAP 182*    Radiological Exams on Admission: Dg Chest 2 View  03/20/2013   *RADIOLOGY REPORT*  Clinical Data: Fall, left-sided pain  CHEST - 2 VIEW  Comparison: Chest radiograph 03/20/2013  Findings: Stable cardiac silhouette.  There is chronic interstitial lung markings.  No effusion, infiltrate, pneumothorax.  Small effusion on the lateral projection.  Diffuse osteopenia.  Remote fracture of the right humerus. Nondisplaced fractures of the upper lateral left ribs again demonstrated.  No pneumothorax.  IMPRESSION:  1.  Chronic interstitial lung disease. 2.  Bilateral small effusions. 3.  Nondisplaced fractures on left again demonstrated.  No pneumothorax.   Original Report Authenticated By: Genevive Bi, M.D.   Dg Ribs Unilateral W/chest Left  03/20/2013   *RADIOLOGY REPORT*  Clinical Data: Fall with left-sided rib pain.  LEFT RIBS AND CHEST - 3+ VIEW  Comparison: Chest x-ray on 12/30/2012  Findings: Chest shows stable chronic disease.  No evidence of pneumothorax, pulmonary consolidation or pleural effusion.  The heart size and mediastinal contours are stable and within normal limits.  Left-sided rib films show mildly displaced fractures involving the sixth, seventh and eighth ribs.  IMPRESSION: Mildly displaced fractures involving the sixth, seventh and eighth left ribs.  No pneumothorax.   Original Report  Authenticated By: Irish Lack, M.D.   Dg Pelvis 1-2 Views  03/20/2013   *RADIOLOGY REPORT*  Clinical Data: Fall with left hip pain.  PELVIS - 1-2 VIEW  Comparison: None.  Findings: No acute fracture or dislocation is identified.  There are proliferative changes involving the lateral left iliac bone. Methylmethacrylate is visualized in the L3 vertebral body. Multiple clips are present in the pelvis related to prior surgery.  IMPRESSION: No acute fracture identified.   Original Report Authenticated By: Irish Lack, M.D.   Dg Elbow 2 Views Left  03/20/2013   *RADIOLOGY REPORT*  Clinical Data: Fall with left elbow pain.  LEFT ELBOW - 2 VIEW  Comparison: None.  Findings: No acute fracture, dislocation or joint effusion is identified.  No incidental bony lesions.  Surrounding soft tissues are unremarkable.  IMPRESSION: Normal left elbow.   Original Report Authenticated By: Irish Lack, M.D.    EKG: Independently reviewed.   Assessment/Plan Principal Problem:   Fracture of multiple ribs Active Problems:   Gait instability   DM (diabetes mellitus)   DVT of lower extremity, bilateral  Fall Mechanical With rib fractures, start the patient on incentive spirometry  the patient is being admitted primarily for fever, questionable pneumonia And pain control Given the fact that he is on Coumadin, will repeat a CT of the head tomorrow to ensure no intracranial bleeding prior to resuming Coumadin.  Fever Patient spiked a low-grade fever in the ER No intercurrent illness Empiric Levaquin for possible aspiration, may need to repeat a chest x-ray in the morning His blood culture/urine culture negative will DC Levaquin  Hypertension Patient is on metoprolol, which will be continued  Diabetes the patient will be started on sliding scale insulin  Urinary incontinence Followed by Dr. Celso Amy in [facility] History of intermittent self-catheterization Multiple urinary tract  infections in the past Urine appears to be clean with some microscopic hematuria next tiny dose of her history of prostrate cancer   Disposition discussed CODE STATUS. Wife would like him to be a full code May need to be discharged to SNF  Code Status:   full Family Communication: bedside Disposition Plan: admit   Time spent: 70 mins   [facility] Pager [phone]  If 7PM-7AM, please contact night-coverage [email or web address] Password [facility] 03/20/2013, 7:28 PM

## 2013-03-20 NOTE — ED Notes (Signed)
Unable to ambulate. Requires 2 assist to get out of bed and to stand. MD Rhunette Croft aware

## 2013-03-20 NOTE — ED Notes (Signed)
Late documentation: Patient arrived in ED with a foley already intact , Draining yellow urine.

## 2013-03-21 ENCOUNTER — Encounter (HOSPITAL_COMMUNITY): Payer: Self-pay | Admitting: *Deleted

## 2013-03-21 ENCOUNTER — Inpatient Hospital Stay (HOSPITAL_COMMUNITY): Payer: Medicare Other

## 2013-03-21 DIAGNOSIS — E119 Type 2 diabetes mellitus without complications: Secondary | ICD-10-CM

## 2013-03-21 DIAGNOSIS — W19XXXA Unspecified fall, initial encounter: Secondary | ICD-10-CM

## 2013-03-21 DIAGNOSIS — Y92009 Unspecified place in unspecified non-institutional (private) residence as the place of occurrence of the external cause: Secondary | ICD-10-CM

## 2013-03-21 DIAGNOSIS — I82409 Acute embolism and thrombosis of unspecified deep veins of unspecified lower extremity: Secondary | ICD-10-CM

## 2013-03-21 DIAGNOSIS — I369 Nonrheumatic tricuspid valve disorder, unspecified: Secondary | ICD-10-CM

## 2013-03-21 LAB — CBC
HCT: 25 % — ABNORMAL LOW (ref 39.0–52.0)
Hemoglobin: 8.3 g/dL — ABNORMAL LOW (ref 13.0–17.0)
MCHC: 33.2 g/dL (ref 30.0–36.0)
RDW: 13.9 % (ref 11.5–15.5)
WBC: 5.4 10*3/uL (ref 4.0–10.5)

## 2013-03-21 LAB — IRON AND TIBC
Saturation Ratios: 11 % — ABNORMAL LOW (ref 20–55)
TIBC: 252 ug/dL (ref 215–435)
UIBC: 225 ug/dL (ref 125–400)

## 2013-03-21 LAB — URINE CULTURE

## 2013-03-21 LAB — RETICULOCYTES: RBC.: 2.98 MIL/uL — ABNORMAL LOW (ref 4.22–5.81)

## 2013-03-21 LAB — COMPREHENSIVE METABOLIC PANEL
ALT: 20 U/L (ref 0–53)
Albumin: 2.2 g/dL — ABNORMAL LOW (ref 3.5–5.2)
Alkaline Phosphatase: 106 U/L (ref 39–117)
BUN: 18 mg/dL (ref 6–23)
Chloride: 102 mEq/L (ref 96–112)
Potassium: 4 mEq/L (ref 3.5–5.1)
Sodium: 133 mEq/L — ABNORMAL LOW (ref 135–145)
Total Bilirubin: 0.3 mg/dL (ref 0.3–1.2)
Total Protein: 6.5 g/dL (ref 6.0–8.3)

## 2013-03-21 LAB — MRSA PCR SCREENING: MRSA by PCR: NEGATIVE

## 2013-03-21 LAB — TSH: TSH: 2.356 u[IU]/mL (ref 0.350–4.500)

## 2013-03-21 LAB — PROTIME-INR: Prothrombin Time: 17.3 seconds — ABNORMAL HIGH (ref 11.6–15.2)

## 2013-03-21 LAB — VITAMIN B12: Vitamin B-12: 851 pg/mL (ref 211–911)

## 2013-03-21 LAB — GLUCOSE, CAPILLARY
Glucose-Capillary: 198 mg/dL — ABNORMAL HIGH (ref 70–99)
Glucose-Capillary: 128 mg/dL — ABNORMAL HIGH (ref 70–99)

## 2013-03-21 LAB — FOLATE: Folate: >20 ng/mL

## 2013-03-21 MED ORDER — WARFARIN - PHARMACIST DOSING INPATIENT: Freq: Every day | Status: DC

## 2013-03-21 MED ORDER — OXYCODONE HCL 5 MG PO TABS
5.0000 mg | ORAL_TABLET | ORAL | Status: DC | PRN
  Administered 2013-03-21 – 2013-03-24 (×5): 5 mg via ORAL
  Filled 2013-03-21 (×5): qty 1

## 2013-03-21 MED ORDER — MORPHINE SULFATE 2 MG/ML IJ SOLN
2.0000 mg | INTRAMUSCULAR | Status: DC | PRN
  Administered 2013-03-21 – 2013-03-23 (×9): 2 mg via INTRAVENOUS
  Filled 2013-03-21 (×9): qty 1

## 2013-03-21 MED ORDER — WARFARIN SODIUM 3 MG PO TABS
3.0000 mg | ORAL_TABLET | Freq: Once | ORAL | Status: AC
  Administered 2013-03-21: 3 mg via ORAL
  Filled 2013-03-21: qty 1

## 2013-03-21 MED ORDER — TRAMADOL HCL 50 MG PO TABS
25.0000 mg | ORAL_TABLET | Freq: Three times a day (TID) | ORAL | Status: DC
  Administered 2013-03-21 – 2013-03-24 (×10): 25 mg via ORAL
  Filled 2013-03-21 (×5): qty 1
  Filled 2013-03-21 (×2): qty 2
  Filled 2013-03-21 (×5): qty 1

## 2013-03-21 MED ORDER — MORPHINE SULFATE 2 MG/ML IJ SOLN: 2.0000 mg | INTRAMUSCULAR | Status: DC | PRN

## 2013-03-21 NOTE — Progress Notes (Signed)
PT Cancellation Note  Patient Details Name: Guy [name] MRN: [id number] DOB: Sep 27, 1920   Cancelled Treatment:    Reason Eval/Treat Not Completed: Pain limiting ability to participate-pt declined to attempt to mobilize. Will check back tomorrow. Thanks.   Rebeca Alert, MPT Pager: [phone]

## 2013-03-21 NOTE — Progress Notes (Signed)
  Echocardiogram 2D Echocardiogram has been performed.  Nestor Ramp M 03/21/2013, 11:48 AM

## 2013-03-21 NOTE — Progress Notes (Addendum)
ANTICOAGULATION CONSULT NOTE - Initial Consult  Pharmacy Consult for warfarin  Indication: h/o DVT  Allergies  Allergen Reactions  . Albuterol Sulfate Hfa ([id number]) Shortness Of Breath and Swelling  . Adhesive (Tape) Other (See Comments)    REACTION: SKIN BLISTERS  . Albuterol Swelling    Per pt., side of his neck began to swell with nebulized Albuterol in doctor's office.  . Contrast Media (Iodinated Diagnostic Agents) Other (See Comments)    Reaction unknown  . Lactose     Other reaction(s): GI Upset (intolerance)  . Norvasc (Amlodipine Besylate) Other (See Comments)    Reaction unknown  . Penicillins Other (See Comments) and Itching    REACTION: ITCHING HANDS  . Sulfa Drugs Cross Reactors Hives  . Sulfamethoxazole Rash    Patient Measurements: Height: 5\' 6"  (167.6 cm) Weight: 137 lb 6.4 oz (62.324 kg) IBW/kg (Calculated) : 63.8 Heparin Dosing Weight:   Vital Signs: Temp: 97.8 F (36.6 C) (06/22 0600) Temp src: Oral (06/22 0600) BP: 130/62 mmHg (06/22 1001) Pulse Rate: 55 (06/22 1001)  Labs:  Recent Labs  03/20/13 1400 03/20/13 1834 03/20/13 2248 03/21/13 0532  HGB 9.2*  --   --  8.3*  HCT 28.4*  --   --  25.0*  PLT 127*  --   --  86*  APTT 41*  --   --   --   LABPROT 17.7*  --   --  17.3*  INR 1.50*  --   --  1.46  CREATININE 1.32  --   --  1.27  TROPONINI  --  <0.30 <0.30 <0.30    Estimated Creatinine Clearance: 34.1 ml/min (by C-G formula based on Cr of 1.27).   Medical History: Past Medical History  Diagnosis Date  . Hypertension   . Diabetes mellitus without complication   . Arthritis   . Sciatica   . GERD (gastroesophageal reflux disease)   . Urge incontinence   . Vertigo   . Hyperlipidemia   . ED (erectile dysfunction)   . Gastric polyposis   . Osteoporosis   . Decreased libido   . Depression   . Hepatitis A   . Stroke   . History of blood transfusion     1946  . OA (osteoarthritis)   . Bladder cancer   . Prostate cancer      S/P prostatectomy; Lung metastasis  . CAD (coronary artery disease) 1996    stent to the LAD   . Hyperlipidemia   . Type 2 diabetes mellitus     Assessment: 17 YOM admitted with fall suffering multiple rib fractures.  CT head revealed no intracranial trauma.  Orders to resume warfarin for DVT diagnosed 3 months ago. INR is subtherapeutic (=1.46), per records from coumadin clinic visit 6/17 (INR=5.4), he was told to hold warfarin 17th-19th.   Adjusted home warfarin dose from 6/17 to be 1.5mg  TuThSu and 3mg  other days  CBC: Hgb=8.3, plts = 86, thrombocytopenia noted from April admission, appears Hgb has run ~10-11 in past  Note on levofloxacin - can potentiate warfarin's effects  On lovenox 40mg  SQ q24h   Goal of Therapy:  INR 2-3 Monitor platelets by anticoagulation protocol: Yes   Plan:   Coumadin 3mg  PO x 1 tonight  Daily INR - watch trend closely with levofloxacin  Monitor CBC and for bleeding  D/C lovenox when INR >= 2  Juliette Alcide, PharmD, BCPS.   Pager: [phone]  03/21/2013,2:07 PM

## 2013-03-21 NOTE — Progress Notes (Addendum)
[facility] PROGRESS NOTE  Guy [name] NFA:[id number] DOB: 05-Jan-1920 DOA: 03/20/2013 PCP: Junious Silk, MD  Assessment/Plan: Principal Problem:   Fracture of multiple ribs Active Problems:   Gait instability   DM (diabetes mellitus)   DVT of lower extremity, bilateral    1.Fall/Left rib fractures:  Patient presented following a mechanical fall, complicated by left 6th, 7th and 8th rib fractures. Managing with analgesics and incentive spirometry. . Coumadin is currently on hold, till head CT scan is completed today. For PT/OT.  2. Fever: Patient was noted to have a low grade temperature of 100.3, in the ED. CXR is devoid of acute findings, urinalysis appears unimpressive. Admitting MD commenced empiric Levaquin, now day #2, and septic work up, including blood and urine cultures, is in progress. Wcc is normal. Will discontinue antibiotics on 03/22/13, if work-up remains negative, and patient remains afebrile 3. Hypertension: BP is controlled on pre-admission anti-hypertensives.  4. Diabetes Mellitus: This is insulin-requiring type 2.  Managing with diet/SSI/Lantus. We shall check HBA1C.  5. Urinary incontinence: This is a long-standing problem, complicated in the past, by recurrent UTIs. Patient reportedly, has practiced intermittent self-catheterization in the past, and follows with Dr. Celso Amy at [facility], as he does have a rather complicated urologic history. Currently has Foley catheter. U/A is negative.  6. VTE: Patient was on Coumadin anticoagulation pre-admission, for bilateral LE DVTs. Holding Coumadin till head CT scan is obtained. If negative, will reinstate anticoagulation.  7. Anemia: This is chronic, normocytic, and likely due to chronic disease. HB appears reasonable at this time. Following CBC.     Code Status: Full Code.  Family Communication:  Disposition Plan: To be determined.    Brief narrative: 79 year old male with history of HTN, s/p  CVA, CAD, s/p stent to LAD, depression, bladder cancer, prostate cancer, s/p prostatectomy, urinary incontinence, s/p remote urinary sphincter surgery, s/p penile implant, DM, dyslipidemia, OA, osteoporosis, sciatica, vertigo, bilateral LE DVT on Coumadin for past 3 months, admitted in April for back pain, had an L3 kyphoplasty, MRSA pneumonia, Klebsiella UTI, status post intubation for one day on 12/29/12, discharged to SNF on 02/08/13, independent at home with a walker, with some difficulty. He presents following a fall. According to history provided by the wife, the patient usually loses balance with his walker and falls backwards. She however states that he has not had any falls since he was discharged from [facility], 02/08/13. Patient apparently lost his balance and fell. There was no loss of consciousness. Patient was able to get up and back in the chair and finish his lunch, before coming to the hospital. Admitted for further management.   Consultants:  N/A.   Procedures:  CXR.  Left elbow X-Ray.  Pelvic X-Ray.   Antibiotics:  Levaquin 03/19/13>>>  HPI/Subjective: C/O left-sided lower rib cage pain.   Objective: Vital signs in last 24 hours: Temp:  [98.2 F (36.8 C)-100.4 F (38 C)] 100 F (37.8 C) (06/21 2200) Pulse Rate:  [52-61] 57 (06/21 2200) Resp:  [20-27] 20 (06/21 2200) BP: (119-184)/(59-88) 119/64 mmHg (06/21 2200) SpO2:  [93 %-97 %] 97 % (06/21 2200) Weight:  [62.324 kg (137 lb 6.4 oz)] 62.324 kg (137 lb 6.4 oz) (06/21 1817) Weight change:  Last BM Date: 03/19/13  Intake/Output from previous day: 06/21 0701 - 06/22 0700 In: 750 [I.V.:750] Out: 550 [Urine:550]     Physical Exam: General: Comfortable, but with shallow inspiration, due to left lower rib cage pain. Alert, communicative, fully oriented, not  short of breath at rest.  HEENT:  Mild-moderate clinical pallor, no jaundice, no conjunctival injection or discharge. NECK:  Supple, JVP not seen, no carotid  bruits, no palpable lymphadenopathy, no palpable goiter. CHEST:  Clinically clear to auscultation, no wheezes, no crackles. HEART:  Sounds 1 and 2 heard, normal, regular, no murmurs. ABDOMEN:  Full, soft, non-tender, no palpable organomegaly, no palpable masses, normal bowel sounds. GENITALIA:  Normal external genitalia. LOWER EXTREMITIES:  No pitting edema, palpable peripheral pulses. MUSCULOSKELETAL SYSTEM:  Generalized osteoarthritic changes, otherwise, normal. CENTRAL NERVOUS SYSTEM:  No focal neurologic deficit on gross examination.  Lab Results:  Recent Labs  03/20/13 1400 03/21/13 0532  WBC 4.8 5.4  HGB 9.2* 8.3*  HCT 28.4* 25.0*  PLT 127* 86*    Recent Labs  03/20/13 1400 03/21/13 0532  NA 131* 133*  K 4.1 4.0  CL 98 102  CO2 26 24  GLUCOSE 275* 122*  BUN 19 18  CREATININE 1.32 1.27  CALCIUM 8.7 8.2*   Recent Results (from the past 240 hour(s))  MRSA PCR SCREENING     Status: None   Collection Time    03/20/13 11:41 PM      Result Value Range Status   MRSA by PCR NEGATIVE  NEGATIVE Final   Comment:            The GeneXpert MRSA Assay (FDA     approved for NASAL specimens     only), is one component of a     comprehensive MRSA colonization     surveillance program. It is not     intended to diagnose MRSA     infection nor to guide or     monitor treatment for     MRSA infections.     Studies/Results: Dg Chest 2 View  03/20/2013   *RADIOLOGY REPORT*  Clinical Data: Fall, left-sided pain  CHEST - 2 VIEW  Comparison: Chest radiograph 03/20/2013  Findings: Stable cardiac silhouette.  There is chronic interstitial lung markings.  No effusion, infiltrate, pneumothorax.  Small effusion on the lateral projection.  Diffuse osteopenia.  Remote fracture of the right humerus. Nondisplaced fractures of the upper lateral left ribs again demonstrated.  No pneumothorax.  IMPRESSION:  1.  Chronic interstitial lung disease. 2.  Bilateral small effusions. 3.  Nondisplaced  fractures on left again demonstrated.  No pneumothorax.   Original Report Authenticated By: Genevive Bi, M.D.   Dg Ribs Unilateral W/chest Left  03/20/2013   *RADIOLOGY REPORT*  Clinical Data: Fall with left-sided rib pain.  LEFT RIBS AND CHEST - 3+ VIEW  Comparison: Chest x-ray on 12/30/2012  Findings: Chest shows stable chronic disease.  No evidence of pneumothorax, pulmonary consolidation or pleural effusion.  The heart size and mediastinal contours are stable and within normal limits.  Left-sided rib films show mildly displaced fractures involving the sixth, seventh and eighth ribs.  IMPRESSION: Mildly displaced fractures involving the sixth, seventh and eighth left ribs.  No pneumothorax.   Original Report Authenticated By: Irish Lack, M.D.   Dg Pelvis 1-2 Views  03/20/2013   *RADIOLOGY REPORT*  Clinical Data: Fall with left hip pain.  PELVIS - 1-2 VIEW  Comparison: None.  Findings: No acute fracture or dislocation is identified.  There are proliferative changes involving the lateral left iliac bone. Methylmethacrylate is visualized in the L3 vertebral body. Multiple clips are present in the pelvis related to prior surgery.  IMPRESSION: No acute fracture identified.   Original Report Authenticated By: Irish Lack,  M.D.   Dg Elbow 2 Views Left  03/20/2013   *RADIOLOGY REPORT*  Clinical Data: Fall with left elbow pain.  LEFT ELBOW - 2 VIEW  Comparison: None.  Findings: No acute fracture, dislocation or joint effusion is identified.  No incidental bony lesions.  Surrounding soft tissues are unremarkable.  IMPRESSION: Normal left elbow.   Original Report Authenticated By: Irish Lack, M.D.    Medications: Scheduled Meds: . bicalutamide  50 mg Oral Daily  . cycloSPORINE  1 drop Both Eyes BID  . enoxaparin (LOVENOX) injection  40 mg Subcutaneous Q24H  . insulin aspart  0-9 Units Subcutaneous TID WC  . insulin glargine  16 Units Subcutaneous QHS  . levofloxacin (LEVAQUIN) IV  750 mg  Intravenous Q48H  . metoprolol tartrate  25 mg Oral BID  . oxybutynin  5 mg Oral QHS  . pantoprazole  40 mg Oral Daily  . polyethylene glycol  17 g Oral Daily  . sertraline  200 mg Oral QHS  . simvastatin  40 mg Oral QHS  . sodium chloride  3 mL Intravenous Q12H   Continuous Infusions: . sodium chloride 75 mL/hr at 03/20/13 1819   PRN Meds:.acetaminophen, acetaminophen, morphine injection, ondansetron (ZOFRAN) IV, ondansetron    LOS: 1 day   Carrell Rahmani,CHRISTOPHER  [facility] Pager [phone]. If 8PM-8AM, please contact night-coverage at [email or web address], password [facility] 03/21/2013, 7:13 AM  LOS: 1 day    \

## 2013-03-22 DIAGNOSIS — Z7901 Long term (current) use of anticoagulants: Secondary | ICD-10-CM

## 2013-03-22 DIAGNOSIS — I2510 Atherosclerotic heart disease of native coronary artery without angina pectoris: Secondary | ICD-10-CM

## 2013-03-22 LAB — CBC
Hemoglobin: 8.6 g/dL — ABNORMAL LOW (ref 13.0–17.0)
Platelets: 83 10*3/uL — ABNORMAL LOW (ref 150–400)
RBC: 2.89 MIL/uL — ABNORMAL LOW (ref 4.22–5.81)
WBC: 5.3 10*3/uL (ref 4.0–10.5)

## 2013-03-22 LAB — BASIC METABOLIC PANEL
CO2: 28 mEq/L (ref 19–32)
Chloride: 103 mEq/L (ref 96–112)
Glucose, Bld: 118 mg/dL — ABNORMAL HIGH (ref 70–99)
Potassium: 4.4 mEq/L (ref 3.5–5.1)
Sodium: 135 mEq/L (ref 135–145)

## 2013-03-22 LAB — HEMOGLOBIN A1C: Mean Plasma Glucose: 166 mg/dL — ABNORMAL HIGH (ref <117)

## 2013-03-22 LAB — URINE CULTURE: Culture: NO GROWTH

## 2013-03-22 LAB — GLUCOSE, CAPILLARY
Glucose-Capillary: 91 mg/dL (ref 70–99)
Glucose-Capillary: 98 mg/dL (ref 70–99)

## 2013-03-22 LAB — PROTIME-INR
INR: 1.54 — ABNORMAL HIGH (ref 0.00–1.49)
Prothrombin Time: 18 seconds — ABNORMAL HIGH (ref 11.6–15.2)

## 2013-03-22 MED ORDER — WARFARIN SODIUM 3 MG PO TABS
3.0000 mg | ORAL_TABLET | Freq: Once | ORAL | Status: AC
  Administered 2013-03-22: 3 mg via ORAL
  Filled 2013-03-22: qty 1

## 2013-03-22 NOTE — Progress Notes (Signed)
Patient's wife would like to see Dr. Brien Few to discuss the plan regarding her husbands discharge.  She is requesting that he be placed in a rehab facility to regain/improve his ambulation/gait.  Paged Dr. Brien Few with her request.

## 2013-03-22 NOTE — Progress Notes (Signed)
[facility] PROGRESS NOTE  PRIMITIVO MERKEY ZOX:[id number] DOB: 1920/06/05 DOA: 03/20/2013 PCP: Junious Silk, MD  Assessment/Plan: Principal Problem:   Fracture of multiple ribs Active Problems:   Gait instability   DM (diabetes mellitus)   DVT of lower extremity, bilateral    1.Fall/Left rib fractures:  Patient presented following a mechanical fall, complicated by left 6th, 7th and 8th rib fractures. Head CT scan was devoid of acute findings. Managing with analgesics and incentive spirometry. For PT/OT. Of note, 2D echocardiogram revealed normal LV cavity size, moderate concentric hypertrophy, EF of 65% to 70% and no regional wall motion abnormalities. There was very mild aortic stenosis. Trivial regurgitation. Valve area: 1.95cm^2(VTI). Valve area: 1.82cm^2 (Vmax). Mitral valve: Calcified annulus. Mildly thickened leaflets. Left atrium, moderately to severely dilated. 2. Fever: Patient was noted to have a low grade temperature of 100.3, in the ED. CXR was devoid of acute findings, urinalysis appeared unimpressive. Admitting MD commenced empiric Levaquin, now day #3, and septic work up, including blood and urine cultures, proved negative. Wcc is normal. Patient remains afebrile. Have discontinued antibiotics today.  3. Hypertension: BP is controlled on pre-admission anti-hypertensives.  4. Diabetes Mellitus: This is insulin-requiring type 2.  Managing with diet/SSI/Lantus, with satisfactory control. HBA1C is 7.4.  5. Urinary incontinence: This is a long-standing problem, complicated in the past, by recurrent UTIs. Patient reportedly, has practiced intermittent self-catheterization in the past, and follows with Dr. Celso Amy at [facility], as he does have a rather complicated urologic history. Currently has Foley catheter. U/A is negative.  6. VTE: Patient was on Coumadin anticoagulation pre-admission, for bilateral LE DVTs. Pharnmacy managing anticoagulation.  7. Anemia:  This is chronic, normocytic, and likely due to chronic disease. HB appears reasonable at this time. Following CBC.     Code Status: Full Code.  Family Communication:  Disposition Plan: To be determined.    Brief narrative: 79 year old male with history of HTN, s/p CVA, CAD, s/p stent to LAD, depression, bladder cancer, prostate cancer, s/p prostatectomy, urinary incontinence, s/p remote urinary sphincter surgery, s/p penile implant, DM, dyslipidemia, OA, osteoporosis, sciatica, vertigo, bilateral LE DVT on Coumadin for past 3 months, admitted in April for back pain, had an L3 kyphoplasty, MRSA pneumonia, Klebsiella UTI, status post intubation for one day on 12/29/12, discharged to SNF on 02/08/13, independent at home with a walker, with some difficulty. He presents following a fall. According to history provided by the wife, the patient usually loses balance with his walker and falls backwards. She however states that he has not had any falls since he was discharged from [facility], 02/08/13. Patient apparently lost his balance and fell. There was no loss of consciousness. Patient was able to get up and back in the chair and finish his lunch, before coming to the hospital. Admitted for further management.   Consultants:  N/A.   Procedures:  CXR.  Left elbow X-Ray.  Pelvic X-Ray.   Antibiotics:  Levaquin 03/19/13-03/22/13.   HPI/Subjective: C/O Still has eft-sided lower rib cage pain.   Objective: Vital signs in last 24 hours: Temp:  [97.7 F (36.5 C)-98.6 F (37 C)] 97.7 F (36.5 C) (06/23 0600) Pulse Rate:  [58-63] 60 (06/23 0600) Resp:  [16-20] 16 (06/23 0600) BP: (123-148)/(49-68) 123/68 mmHg (06/23 0600) SpO2:  [95 %-97 %] 97 % (06/23 0600) Weight change:  Last BM Date: 03/20/13  Intake/Output from previous day: 06/22 0701 - 06/23 0700 In: 320 [P.O.:320] Out: 700 [Urine:700]     Physical Exam:  General: Comfortable in chair. Alert, communicative, fully oriented, not  short of breath at rest.  HEENT:  Mild-moderate clinical pallor, no jaundice, no conjunctival injection or discharge. NECK:  Supple, JVP not seen, no carotid bruits, no palpable lymphadenopathy, no palpable goiter. CHEST:  Clinically clear to auscultation, no wheezes, no crackles. Tender to palpation lefty lower rib cage.  HEART:  Sounds 1 and 2 heard, normal, regular, no murmurs. ABDOMEN:  Full, soft, non-tender, no palpable organomegaly, no palpable masses, normal bowel sounds. GENITALIA:  Not examined. LOWER EXTREMITIES:  No pitting edema, palpable peripheral pulses. MUSCULOSKELETAL SYSTEM:  Generalized osteoarthritic changes, otherwise, normal. CENTRAL NERVOUS SYSTEM:  No focal neurologic deficit on gross examination.  Lab Results:  Recent Labs  03/21/13 0532 03/22/13 0511  WBC 5.4 5.3  HGB 8.3* 8.6*  HCT 25.0* 26.9*  PLT 86* 83*    Recent Labs  03/21/13 0532 03/22/13 0511  NA 133* 135  K 4.0 4.4  CL 102 103  CO2 24 28  GLUCOSE 122* 118*  BUN 18 17  CREATININE 1.27 1.33  CALCIUM 8.2* 8.7   Recent Results (from the past 240 hour(s))  URINE CULTURE     Status: None   Collection Time    03/20/13  2:00 PM      Result Value Range Status   Specimen Description URINE, CATHETERIZED   Final   Special Requests NONE   Final   Culture  Setup Time 03/20/2013 22:44   Final   Colony Count NO GROWTH   Final   Culture NO GROWTH   Final   Report Status 03/21/2013 FINAL   Final  CULTURE, BLOOD (ROUTINE X 2)     Status: None   Collection Time    03/20/13  6:34 PM      Result Value Range Status   Specimen Description BLOOD LEFT ARM   Final   Special Requests BOTTLES DRAWN AEROBIC AND ANAEROBIC    Final   Culture  Setup Time 03/20/2013 23:01   Final   Culture     Final   Value:        BLOOD CULTURE RECEIVED NO GROWTH TO DATE CULTURE WILL BE HELD FOR 5 DAYS BEFORE ISSUING A FINAL NEGATIVE REPORT   Report Status PENDING   Incomplete  CULTURE, BLOOD (ROUTINE X 2)     Status:  None   Collection Time    03/20/13  6:38 PM      Result Value Range Status   Specimen Description BLOOD LEFT HAND   Final   Special Requests BOTTLES DRAWN AEROBIC ONLY    Final   Culture  Setup Time 03/20/2013 23:01   Final   Culture     Final   Value:        BLOOD CULTURE RECEIVED NO GROWTH TO DATE CULTURE WILL BE HELD FOR 5 DAYS BEFORE ISSUING A FINAL NEGATIVE REPORT   Report Status PENDING   Incomplete  MRSA PCR SCREENING     Status: None   Collection Time    03/20/13 11:41 PM      Result Value Range Status   MRSA by PCR NEGATIVE  NEGATIVE Final   Comment:            The GeneXpert MRSA Assay (FDA     approved for NASAL specimens     only), is one component of a     comprehensive MRSA colonization     surveillance program. It is not  intended to diagnose MRSA     infection nor to guide or     monitor treatment for     MRSA infections.     Studies/Results: Dg Chest 2 View  03/20/2013   *RADIOLOGY REPORT*  Clinical Data: Fall, left-sided pain  CHEST - 2 VIEW  Comparison: Chest radiograph 03/20/2013  Findings: Stable cardiac silhouette.  There is chronic interstitial lung markings.  No effusion, infiltrate, pneumothorax.  Small effusion on the lateral projection.  Diffuse osteopenia.  Remote fracture of the right humerus. Nondisplaced fractures of the upper lateral left ribs again demonstrated.  No pneumothorax.  IMPRESSION:  1.  Chronic interstitial lung disease. 2.  Bilateral small effusions. 3.  Nondisplaced fractures on left again demonstrated.  No pneumothorax.   Original Report Authenticated By: Genevive Bi, M.D.   Dg Ribs Unilateral W/chest Left  03/20/2013   *RADIOLOGY REPORT*  Clinical Data: Fall with left-sided rib pain.  LEFT RIBS AND CHEST - 3+ VIEW  Comparison: Chest x-ray on 12/30/2012  Findings: Chest shows stable chronic disease.  No evidence of pneumothorax, pulmonary consolidation or pleural effusion.  The heart size and mediastinal contours are stable and  within normal limits.  Left-sided rib films show mildly displaced fractures involving the sixth, seventh and eighth ribs.  IMPRESSION: Mildly displaced fractures involving the sixth, seventh and eighth left ribs.  No pneumothorax.   Original Report Authenticated By: Irish Lack, M.D.   Dg Pelvis 1-2 Views  03/20/2013   *RADIOLOGY REPORT*  Clinical Data: Fall with left hip pain.  PELVIS - 1-2 VIEW  Comparison: None.  Findings: No acute fracture or dislocation is identified.  There are proliferative changes involving the lateral left iliac bone. Methylmethacrylate is visualized in the L3 vertebral body. Multiple clips are present in the pelvis related to prior surgery.  IMPRESSION: No acute fracture identified.   Original Report Authenticated By: Irish Lack, M.D.   Dg Elbow 2 Views Left  03/20/2013   *RADIOLOGY REPORT*  Clinical Data: Fall with left elbow pain.  LEFT ELBOW - 2 VIEW  Comparison: None.  Findings: No acute fracture, dislocation or joint effusion is identified.  No incidental bony lesions.  Surrounding soft tissues are unremarkable.  IMPRESSION: Normal left elbow.   Original Report Authenticated By: Irish Lack, M.D.   Ct Head Wo Contrast  03/21/2013   *RADIOLOGY REPORT*  Clinical Data: Fall while on anticoagulation therapy  CT HEAD WITHOUT CONTRAST  Technique:  Contiguous axial images were obtained from the base of the skull through the vertex without contrast.  Comparison: 12/29/2012  Findings: No intracranial hemorrhage.  No parenchymal contusion. No midline shift or mass effect.  Basilar cisterns are patent. No skull base fracture.  No fluid in the paranasal sinuses or mastoid air cells.  Atrophy and microvascular disease are unchanged.  There is mucosal thickening in the right maxillary sinus unchanged from prior.  IMPRESSION:  1.  No acute intracranial trauma. 2.  No change from prior.   Original Report Authenticated By: Genevive Bi, M.D.    Medications: Scheduled Meds: .  bicalutamide  50 mg Oral Daily  . cycloSPORINE  1 drop Both Eyes BID  . enoxaparin (LOVENOX) injection  40 mg Subcutaneous Q24H  . insulin aspart  0-9 Units Subcutaneous TID WC  . insulin glargine  16 Units Subcutaneous QHS  . metoprolol tartrate  25 mg Oral BID  . oxybutynin  5 mg Oral QHS  . pantoprazole  40 mg Oral Daily  . polyethylene glycol  17 g Oral Daily  . sertraline  200 mg Oral QHS  . simvastatin  40 mg Oral QHS  . sodium chloride  3 mL Intravenous Q12H  . traMADol  25 mg Oral TID  . warfarin  3 mg Oral ONCE-1800  . Warfarin - Pharmacist Dosing Inpatient   Does not apply q1800   Continuous Infusions:   PRN Meds:.acetaminophen, acetaminophen, morphine injection, ondansetron (ZOFRAN) IV, ondansetron, oxyCODONE    LOS: 2 days   Alyse Kathan,CHRISTOPHER  [facility] Pager [phone]. If 8PM-8AM, please contact night-coverage at [email or web address], password [facility] 03/22/2013, 11:10 AM  LOS: 2 days    \

## 2013-03-22 NOTE — Evaluation (Signed)
Occupational Therapy Evaluation Patient Details Name: Guy [name] MRN: [id number] DOB: 04-15-1920 Today's Date: 03/22/2013 Time: 6213-0865 OT Time Calculation (min): 22 min  OT Assessment / Plan / Recommendation Clinical Impression  Pt admitted for fall with resulting rib fractures. He is limited by pain currently and would benefit from skilled OT services to improve ADL and functional mobility independence. Wife is caregiver at home and no other family lives nearby.    OT Assessment  Patient needs continued OT Services    Follow Up Recommendations  SNF;Supervision/Assistance - 24 hour    Barriers to Discharge      Equipment Recommendations  None recommended by OT    Recommendations for Other Services    Frequency  Min 2X/week    Precautions / Restrictions Precautions Precautions: Fall Restrictions Weight Bearing Restrictions: No   Pertinent Vitals/Pain 5/10 L rib area. reposition    ADL  Eating/Feeding:  (not tested) Grooming: Simulated;Wash/dry face;Minimal assistance Where Assessed - Grooming: Supported sitting Upper Body Bathing: Simulated;Chest;Right arm;Left arm;Abdomen;Moderate assistance (painful to move L UE due to rib fractures, frozen R shoulder) Where Assessed - Upper Body Bathing: Unsupported sitting Lower Body Bathing: Simulated;Maximal assistance Where Assessed - Lower Body Bathing: Supported sit to stand Upper Body Dressing: Simulated;Maximal assistance (due to pain) Where Assessed - Upper Body Dressing: Unsupported sitting Lower Body Dressing: Simulated;+1 Total assistance Where Assessed - Lower Body Dressing: Supported sit to stand Toilet Transfer: Simulated;Moderate assistance (min assist to stand from EOB. mod to sit in chair) Toilet Transfer Method: Sit to stand;Stand pivot (up to walk in hallway. chair pulled up. see PT note) Toileting - Clothing Manipulation and Hygiene: Simulated;Maximal assistance Where Assessed - Toileting Clothing  Manipulation and Hygiene: Sit to stand from 3-in-1 or toilet Equipment Used: Rolling walker ADL Comments: Pt limited by pain in L ribs  and states he has a frozen R shoulder. Any movement is painful currently.    OT Diagnosis: Generalized weakness;Acute pain  OT Problem List: Decreased strength;Decreased range of motion;Pain;Decreased knowledge of use of DME or AE OT Treatment Interventions: Self-care/ADL training;DME and/or AE instruction;Patient/family education;Therapeutic activities   OT Goals Acute Rehab OT Goals OT Goal Formulation: With patient Time For Goal Achievement: 04/05/13 Potential to Achieve Goals: Good ADL Goals Pt Will Perform Grooming: with supervision;Unsupported;Sitting, edge of bed;Sitting, chair ADL Goal: Grooming - Progress: Goal set today Pt Will Perform Upper Body Bathing: with supervision;Unsupported;Sitting, edge of bed;Sitting, chair ADL Goal: Upper Body Bathing - Progress: Goal set today Pt Will Perform Lower Body Bathing: with mod assist;Sit to stand from chair;Sit to stand from bed ADL Goal: Lower Body Bathing - Progress: Goal set today Pt Will Transfer to Toilet: with min assist;Ambulation;3-in-1 (min guard) ADL Goal: Toilet Transfer - Progress: Goal set today Pt Will Perform Toileting - Clothing Manipulation: with min assist;Standing ADL Goal: Toileting - Clothing Manipulation - Progress: Goal set today Additional ADL Goal #1: Pt will transfer supine to EOB in prep for ADL with min assist. ADL Goal: Additional Goal #1 - Progress: Goal set today Arm Goals Additional Arm Goal #1: Pt will tolerate gentle A/AROM to bilateral UEs for 10 reps to improve ROM for ADL. Arm Goal: Additional Goal #1 - Progress: Goal set today  Visit Information  Last OT Received On: 03/22/13 Assistance Needed: +1 (Simultaneous filing. User may not have seen previous data.) PT/OT Co-Evaluation/Treatment: Yes    Subjective Data  Subjective: I can try Patient Stated Goal:  agreeable to PT/OT today   Prior Functioning  Home Living Lives With: Spouse Available Help at Discharge: Family Type of Home: House Home Access: Ramped entrance Home Layout: One level Bathroom Shower/Tub: Tub/shower unit;Walk-in shower (usually sponges) Bathroom Toilet: Standard Home Adaptive Equipment: Raised toilet seat with rails;Walker - rolling Prior Function Level of Independence: Needs assistance Needs Assistance: Transfers;Gait;Bathing;Meal Prep;Light Housekeeping Bath: Minimal Dressing: Moderate Meal Prep: Total Light Housekeeping: Total Gait Assistance: supervision by wife Transfer Assistance: supervision Communication Communication: HOH         Vision/Perception     Cognition  Cognition Arousal/Alertness: Awake/alert Behavior During Therapy: WFL for tasks assessed/performed Overall Cognitive Status: Within Functional Limits for tasks assessed    Extremity/Trunk Assessment Right Upper Extremity Assessment RUE ROM/Strength/Tone: Deficits RUE ROM/Strength/Tone Deficits: pt reports frozen R shoulder. AAROM to about 100 degrees comfortably. Elbow distal grossly 3+/5 grip is fair Left Upper Extremity Assessment LUE ROM/Strength/Tone: Deficits LUE ROM/Strength/Tone Deficits: limited due to ribcage pain from rib fractures. Only minimally raises L shoulder to about 20 degrees. elbow distal per observation appears grossly at least 3+/5 Right Lower Extremity Assessment RLE ROM/Strength/Tone: Deficits RLE ROM/Strength/Tone Deficits: Strength at least 4/5 with functional mobility Left Lower Extremity Assessment LLE ROM/Strength/Tone: Deficits LLE ROM/Strength/Tone Deficits: Strength at least 4/5 with functional mobility Trunk Assessment Trunk Assessment: Normal     Mobility Bed Mobility Bed Mobility: Supine to Sit Supine to Sit: HOB elevated;With rails;3: Mod assist Details for Bed Mobility Assistance: Increased time and heavy reliance on rail. VCS safety,  technique, hand placement. Assist for bil LEs off bed and trunk to upright. Utilized bedpad to assist with scooting, positioning Transfers Transfers: Sit to Stand;Stand to Sit Sit to Stand: 4: Min assist;From bed;With upper extremity assist Stand to Sit: 3: Mod assist;To chair/3-in-1;With armrests Details for Transfer Assistance: assist to rise, stabilize, control descent. VCS safety, technique, hand placement. Increased time.      Exercise     Balance     End of Session OT - End of Session Activity Tolerance: Patient limited by pain Patient left: in chair;with call bell/phone within reach  GO     Lennox Laity [phone] 03/22/2013, 11:45 AM

## 2013-03-22 NOTE — Care Management Note (Signed)
Patient is currently active with [facility]. RN/PT/OT/HHA.

## 2013-03-22 NOTE — Evaluation (Signed)
Physical Therapy Evaluation Patient Details Name: Guy [name] MRN: [id number] DOB: Apr 11, 1920 Today's Date: 03/22/2013 Time: 1308-6578 PT Time Calculation (min): 22 min  PT Assessment / Plan / Recommendation Clinical Impression  79 yo male admitted with fall, sustained multiple rib fractures-L side 6,7,8. Pt recently discharged from SNF this past May. On eval, pt required Mod assist for mobility-able to ambulate ~20 feet with RW. Increased time to complete all tasks and mobility is significantly limited by pain. Recommend SNF for continued rehab at this time.     PT Assessment  Patient needs continued PT services    Follow Up Recommendations  SNF    Does the patient have the potential to tolerate intense rehabilitation      Barriers to Discharge        Equipment Recommendations  None recommended by PT    Recommendations for Other Services OT consult   Frequency Min 3X/week    Precautions / Restrictions Precautions Precautions: Fall Restrictions Weight Bearing Restrictions: No   Pertinent Vitals/Pain 5/10 L side-at rest and with movement      Mobility  Bed Mobility Bed Mobility: Supine to Sit Supine to Sit: HOB elevated;With rails;3: Mod assist Details for Bed Mobility Assistance: Increased time and heavy reliance on rail. VCS safety, technique, hand placement. Assist for bil LEs off bed and trunk to upright. Utilized bedpad to assist with scooting, positioning Transfers Transfers: Sit to Stand;Stand to Sit Sit to Stand: 4: Min assist;From bed Stand to Sit: 3: Mod assist;To chair/3-in-1;With armrests Details for Transfer Assistance: assist to rise, stabilize, control descent. VCS safety, technique, hand placement. Increased time.  Ambulation/Gait Ambulation/Gait Assistance: 4: Min assist Ambulation Distance (Feet): 20 Feet Assistive device: Rolling walker Ambulation/Gait Assistance Details: slow gait speed. assist to stabilize throughut ambulation. VCs safety.  Fatigues easily. Dyspnea 3/4 with ambulation.  Gait Pattern: Decreased step length - right;Decreased step length - left;Decreased stride length;Trunk flexed    Exercises     PT Diagnosis: Difficulty walking;Abnormality of gait;Generalized weakness;Acute pain  PT Problem List: Decreased strength;Decreased range of motion;Decreased activity tolerance;Decreased balance;Decreased mobility;Pain;Decreased knowledge of use of DME PT Treatment Interventions: DME instruction;Gait training;Functional mobility training;Therapeutic activities;Therapeutic exercise;Balance training;Patient/family education   PT Goals Acute Rehab PT Goals PT Goal Formulation: With patient Time For Goal Achievement: 04/05/13 Potential to Achieve Goals: Good Pt will go Supine/Side to Sit: with supervision PT Goal: Supine/Side to Sit - Progress: Goal set today Pt will go Sit to Supine/Side: with supervision PT Goal: Sit to Supine/Side - Progress: Goal set today Pt will go Sit to Stand: with supervision PT Goal: Sit to Stand - Progress: Goal set today Pt will Ambulate: 51 - 150 feet;with supervision;with rolling walker PT Goal: Ambulate - Progress: Goal set today  Visit Information  Last PT Received On: 03/22/13 Assistance Needed: +1 (Simultaneous filing. User may not have seen previous data.)    Subjective Data  Subjective: It hurst to move this (left) arm Patient Stated Goal: less pain. home   Prior Functioning  Home Living Lives With: Spouse Available Help at Discharge: Family Type of Home: House Home Access: Ramped entrance Home Layout: One level Bathroom Shower/Tub: Tub/shower unit;Walk-in shower (usually sponges) Bathroom Toilet: Standard Home Adaptive Equipment: Raised toilet seat with rails;Walker - rolling Prior Function Level of Independence: Needs assistance Needs Assistance: Transfers;Gait;Bathing;Meal Prep;Light Housekeeping Bath: Minimal Dressing: Moderate Meal Prep: Total Light  Housekeeping: Total Gait Assistance: supervision by wife Transfer Assistance: supervision Communication Communication: [facility]    Cognition  Cognition Arousal/Alertness: Awake/alert  Behavior During Therapy: WFL for tasks assessed/performed Overall Cognitive Status: Within Functional Limits for tasks assessed    Extremity/Trunk Assessment Right Upper Extremity Assessment RUE ROM/Strength/Tone: Deficits RUE ROM/Strength/Tone Deficits: pt reports frozen R shoulder. AAROM to about 100 degrees comfortably. Elbow distal grossly 3+/5 grip is fair Left Upper Extremity Assessment LUE ROM/Strength/Tone: Deficits LUE ROM/Strength/Tone Deficits: limited due to ribcage pain from rib fractures. Only minimally raises L shoulder to about 20 degrees. elbow distal per observation appears grossly at least 3+/5 Right Lower Extremity Assessment RLE ROM/Strength/Tone: Deficits RLE ROM/Strength/Tone Deficits: Strength at least 4/5 with functional mobility Left Lower Extremity Assessment LLE ROM/Strength/Tone: Deficits LLE ROM/Strength/Tone Deficits: Strength at least 4/5 with functional mobility Trunk Assessment Trunk Assessment: Normal   Balance    End of Session PT - End of Session Equipment Utilized During Treatment: Gait belt Activity Tolerance: Patient limited by fatigue;Patient limited by pain Patient left: in chair;with call bell/phone within reach  GP     Rebeca Alert, MPT Pager: [phone]

## 2013-03-22 NOTE — Progress Notes (Signed)
ANTICOAGULATION CONSULT NOTE - Follow-up Consult  Pharmacy Consult for warfarin  Indication: Hx DVT  Allergies  Allergen Reactions  . Albuterol Sulfate Hfa (NWG:[id number]) Shortness Of Breath and Swelling  . Adhesive (Tape) Other (See Comments)    REACTION: SKIN BLISTERS  . Albuterol Swelling    Per pt., side of his neck began to swell with nebulized Albuterol in doctor's office.  . Contrast Media (Iodinated Diagnostic Agents) Other (See Comments)    Reaction unknown  . Lactose     Other reaction(s): GI Upset (intolerance)  . Norvasc (Amlodipine Besylate) Other (See Comments)    Reaction unknown  . Penicillins Other (See Comments) and Itching    REACTION: ITCHING HANDS  . Sulfa Drugs Cross Reactors Hives  . Sulfamethoxazole Rash    Patient Measurements: Height: 5\' 6"  (167.6 cm) Weight: 137 lb 6.4 oz (62.324 kg) IBW/kg (Calculated) : 63.8  Vital Signs: Temp: 97.7 F (36.5 C) (06/23 0600) Temp src: Oral (06/23 0600) BP: 123/68 mmHg (06/23 0600) Pulse Rate: 60 (06/23 0600)  Labs:  Recent Labs  03/20/13 1400 03/20/13 1834 03/20/13 2248 03/21/13 0532 03/22/13 0511  HGB 9.2*  --   --  8.3* 8.6*  HCT 28.4*  --   --  25.0* 26.9*  PLT 127*  --   --  86* 83*  APTT 41*  --   --   --   --   LABPROT 17.7*  --   --  17.3* 18.0*  INR 1.50*  --   --  1.46 1.54*  CREATININE 1.32  --   --  1.27 1.33  TROPONINI  --  <0.30 <0.30 <0.30  --     Estimated Creatinine Clearance: 32.5 ml/min (by C-G formula based on Cr of 1.33).   Medical History: Past Medical History  Diagnosis Date  . Hypertension   . Diabetes mellitus without complication   . Arthritis   . Sciatica   . GERD (gastroesophageal reflux disease)   . Urge incontinence   . Vertigo   . Hyperlipidemia   . ED (erectile dysfunction)   . Gastric polyposis   . Osteoporosis   . Decreased libido   . Depression   . Hepatitis A   . Stroke   . History of blood transfusion     1946  . OA (osteoarthritis)   .  Bladder cancer   . Prostate cancer     S/P prostatectomy; Lung metastasis  . CAD (coronary artery disease) 1996    stent to the LAD   . Hyperlipidemia   . Type 2 diabetes mellitus     Assessment: 89 YOM on chronic warfarin PTA for hx DVT admitted 6/21 for fall suffering multiple rib fractures.  CT head revealed no intracranial trauma. INR subtherapeutic (=1.46) on admission, per records from coumadin clinic visit 6/17 (INR=5.4), he was told to hold warfarin 17th-19th.   Home warfarin dose from 6/17: 1.5mg  TuThSu and 3mg  other days  INR small rise to 1.54, still subtherapeutic.    CBC: Hgb=8.6, plts = 83, thrombocytopenia noted from April admission, appears Hgb has run ~10-11 in past  Note on levofloxacin - can potentiate warfarin's effects  On lovenox 40mg  SQ q24h   Goal of Therapy:  INR 2-3 Monitor platelets by anticoagulation protocol: Yes   Plan:   Repeat Coumadin 3mg  PO x 1 tonight  Daily INR - watch trend closely with levofloxacin  Monitor CBC and for bleeding  D/C lovenox when INR >= 2  Haynes Hoehn, PharmD 03/22/2013  7:58 AM  Pager: [phone]

## 2013-03-23 LAB — BASIC METABOLIC PANEL
BUN: 19 mg/dL (ref 6–23)
CO2: 28 mEq/L (ref 19–32)
Chloride: 101 mEq/L (ref 96–112)
Creatinine, Ser: 1.39 mg/dL — ABNORMAL HIGH (ref 0.50–1.35)
Glucose, Bld: 99 mg/dL (ref 70–99)
Sodium: 134 mEq/L — ABNORMAL LOW (ref 135–145)

## 2013-03-23 LAB — PROTIME-INR
INR: 1.68 — ABNORMAL HIGH (ref 0.00–1.49)
Prothrombin Time: 19.2 seconds — ABNORMAL HIGH (ref 11.6–15.2)

## 2013-03-23 LAB — CBC
MCHC: 32.6 g/dL (ref 30.0–36.0)
RDW: 14.1 % (ref 11.5–15.5)

## 2013-03-23 LAB — GLUCOSE, CAPILLARY: Glucose-Capillary: 138 mg/dL — ABNORMAL HIGH (ref 70–99)

## 2013-03-23 MED ORDER — POLYETHYLENE GLYCOL 3350 17 G PO PACK: 17.0000 g | PACK | Freq: Every day | ORAL | Status: DC

## 2013-03-23 MED ORDER — OXYCODONE HCL 5 MG PO TABS: 5.0000 mg | ORAL_TABLET | ORAL | Status: DC | PRN

## 2013-03-23 MED ORDER — TRAMADOL HCL 50 MG PO TABS: 25.0000 mg | ORAL_TABLET | Freq: Three times a day (TID) | ORAL | Status: DC

## 2013-03-23 MED ORDER — ENOXAPARIN SODIUM 40 MG/0.4ML ~~LOC~~ SOLN: 40.0000 mg | SUBCUTANEOUS | Status: DC

## 2013-03-23 MED ORDER — INSULIN GLARGINE 100 UNIT/ML ~~LOC~~ SOLN: 16.0000 [IU] | Freq: Every day | SUBCUTANEOUS | Status: DC

## 2013-03-23 MED ORDER — WARFARIN SODIUM 4 MG PO TABS
4.0000 mg | ORAL_TABLET | Freq: Once | ORAL | Status: AC
  Administered 2013-03-23: 4 mg via ORAL
  Filled 2013-03-23: qty 1

## 2013-03-23 MED ORDER — METOPROLOL TARTRATE 25 MG PO TABS: 25.0000 mg | ORAL_TABLET | Freq: Two times a day (BID) | ORAL | Status: DC

## 2013-03-23 NOTE — Progress Notes (Signed)
[facility] PROGRESS NOTE  Guy [name] ZOX:[id number] DOB: 11-Sep-1920 DOA: 03/20/2013 PCP: Junious Silk, MD  Assessment/Plan: Principal Problem:   Fracture of multiple ribs Active Problems:   Gait instability   DM (diabetes mellitus)   DVT of lower extremity, bilateral    1.Fall/Left rib fractures:  Patient presented following a mechanical fall, complicated by left 6th, 7th and 8th rib fractures. Head CT scan was devoid of acute findings. Managing with analgesics and incentive spirometry. For PT/OT. Of note, 2D echocardiogram revealed normal LV cavity size, moderate concentric hypertrophy, EF of 65% to 70% and no regional wall motion abnormalities. There was very mild aortic stenosis. Trivial regurgitation. Valve area: 1.95cm^2(VTI). Valve area: 1.82cm^2 (Vmax). Mitral valve: Calcified annulus. Mildly thickened leaflets. Left atrium, moderately to severely dilated. 2. Fever: Patient was noted to have a low grade temperature of 100.3, in the ED. CXR was devoid of acute findings, urinalysis appeared unimpressive. Admitting MD commenced empiric Levaquin, however septic work up, including blood and urine cultures, proved negative. Wcc remained normal and patient remained afebrile. Discontinued antibiotics on 03/22/13, without deleterious effect.   3. Hypertension: BP is controlled on pre-admission anti-hypertensives.  4. Diabetes Mellitus: This is insulin-requiring type 2.  Managing with diet/SSI/Lantus, with satisfactory control. HBA1C is 7.4.  5. Urinary incontinence: This is a long-standing problem, complicated in the past, by recurrent UTIs. Patient reportedly, has practiced intermittent self-catheterization in the past, and follows with Dr. Celso Amy at [facility], as he does have a rather complicated urologic history. Currently has Foley catheter, which shall remain in situ, till his follow up at [facility]. U/A is negative.  6. VTE: Patient was on Coumadin  anticoagulation pre-admission, for bilateral LE DVTs. Thios has been continued. Pharnmacy managing anticoagulation.  7. Anemia: This is chronic, normocytic, and likely due to chronic disease. HB appears reasonable at this time. Following CBC.     Code Status: Full Code.  Family Communication:  Disposition Plan: To be determined. Aiming SNF on 03/24/13.    Brief narrative: 79 year old male with history of HTN, s/p CVA, CAD, s/p stent to LAD, depression, bladder cancer, prostate cancer, s/p prostatectomy, urinary incontinence, s/p remote urinary sphincter surgery, s/p penile implant, DM, dyslipidemia, OA, osteoporosis, sciatica, vertigo, bilateral LE DVT on Coumadin for past 3 months, admitted in April for back pain, had an L3 kyphoplasty, MRSA pneumonia, Klebsiella UTI, status post intubation for one day on 12/29/12, discharged to SNF on 02/08/13, independent at home with a walker, with some difficulty. He presents following a fall. According to history provided by the wife, the patient usually loses balance with his walker and falls backwards. She however states that he has not had any falls since he was discharged from [facility], 02/08/13. Patient apparently lost his balance and fell. There was no loss of consciousness. Patient was able to get up and back in the chair and finish his lunch, before coming to the hospital. Admitted for further management.   Consultants:  N/A.   Procedures:  CXR.  Left elbow X-Ray.  Pelvic X-Ray.   Antibiotics:  Levaquin 03/19/13-03/22/13.   HPI/Subjective: Rib cage pain is much improved. Eating well.   Objective: Vital signs in last 24 hours: Temp:  [98 F (36.7 C)-98.6 F (37 C)] 98.2 F (36.8 C) (06/24 0600) Pulse Rate:  [53-66] 62 (06/24 0600) Resp:  [18] 18 (06/24 0600) BP: (104-160)/(61-71) 160/71 mmHg (06/24 0600) SpO2:  [93 %-97 %] 96 % (06/24 0600) Weight change:  Last BM Date: 03/20/13  Intake/Output from previous day: 06/23 0701 -  06/24 0700 In: 60 [P.O.:60] Out: 800 [Urine:800]     Physical Exam: General: Comfortable in chair. Alert, communicative, fully oriented, not short of breath at rest.  HEENT:  Mild-moderate clinical pallor, no jaundice, no conjunctival injection or discharge. NECK:  Supple, JVP not seen, no carotid bruits, no palpable lymphadenopathy, no palpable goiter. CHEST:  Clinically clear to auscultation, no wheezes, no crackles. Tender to palpation lefty lower rib cage, albeit less.  HEART:  Sounds 1 and 2 heard, normal, regular, no murmurs. ABDOMEN:  Full, soft, non-tender, no palpable organomegaly, no palpable masses, normal bowel sounds. GENITALIA:  Not examined. LOWER EXTREMITIES:  No pitting edema, palpable peripheral pulses. MUSCULOSKELETAL SYSTEM:  Generalized osteoarthritic changes, otherwise, normal. CENTRAL NERVOUS SYSTEM:  No focal neurologic deficit on gross examination.  Lab Results:  Recent Labs  03/22/13 0511 03/23/13 0505  WBC 5.3 5.7  HGB 8.6* 9.0*  HCT 26.9* 27.6*  PLT 83* 98*    Recent Labs  03/22/13 0511 03/23/13 0505  NA 135 134*  K 4.4 4.1  CL 103 101  CO2 28 28  GLUCOSE 118* 99  BUN 17 19  CREATININE 1.33 1.39*  CALCIUM 8.7 8.6   Recent Results (from the past 240 hour(s))  URINE CULTURE     Status: None   Collection Time    03/20/13  2:00 PM      Result Value Range Status   Specimen Description URINE, CATHETERIZED   Final   Special Requests NONE   Final   Culture  Setup Time 03/20/2013 22:44   Final   Colony Count NO GROWTH   Final   Culture NO GROWTH   Final   Report Status 03/21/2013 FINAL   Final  CULTURE, BLOOD (ROUTINE X 2)     Status: None   Collection Time    03/20/13  6:34 PM      Result Value Range Status   Specimen Description BLOOD LEFT ARM   Final   Special Requests BOTTLES DRAWN AEROBIC AND ANAEROBIC    Final   Culture  Setup Time 03/20/2013 23:01   Final   Culture     Final   Value:        BLOOD CULTURE RECEIVED NO GROWTH TO  DATE CULTURE WILL BE HELD FOR 5 DAYS BEFORE ISSUING A FINAL NEGATIVE REPORT   Report Status PENDING   Incomplete  CULTURE, BLOOD (ROUTINE X 2)     Status: None   Collection Time    03/20/13  6:38 PM      Result Value Range Status   Specimen Description BLOOD LEFT HAND   Final   Special Requests BOTTLES DRAWN AEROBIC ONLY    Final   Culture  Setup Time 03/20/2013 23:01   Final   Culture     Final   Value:        BLOOD CULTURE RECEIVED NO GROWTH TO DATE CULTURE WILL BE HELD FOR 5 DAYS BEFORE ISSUING A FINAL NEGATIVE REPORT   Report Status PENDING   Incomplete  MRSA PCR SCREENING     Status: None   Collection Time    03/20/13 11:41 PM      Result Value Range Status   MRSA by PCR NEGATIVE  NEGATIVE Final   Comment:            The GeneXpert MRSA Assay (FDA     approved for NASAL specimens     only), is one  component of a     comprehensive MRSA colonization     surveillance program. It is not     intended to diagnose MRSA     infection nor to guide or     monitor treatment for     MRSA infections.  URINE CULTURE     Status: None   Collection Time    03/21/13  5:17 AM      Result Value Range Status   Specimen Description URINE, CATHETERIZED   Final   Special Requests NONE   Final   Culture  Setup Time 03/21/2013 15:22   Final   Colony Count NO GROWTH   Final   Culture NO GROWTH   Final   Report Status 03/22/2013 FINAL   Final     Studies/Results: No results found.  Medications: Scheduled Meds: . bicalutamide  50 mg Oral Daily  . cycloSPORINE  1 drop Both Eyes BID  . enoxaparin (LOVENOX) injection  40 mg Subcutaneous Q24H  . insulin aspart  0-9 Units Subcutaneous TID WC  . insulin glargine  16 Units Subcutaneous QHS  . metoprolol tartrate  25 mg Oral BID  . oxybutynin  5 mg Oral QHS  . pantoprazole  40 mg Oral Daily  . polyethylene glycol  17 g Oral Daily  . sertraline  200 mg Oral QHS  . simvastatin  40 mg Oral QHS  . sodium chloride  3 mL Intravenous Q12H  .  traMADol  25 mg Oral TID  . warfarin  4 mg Oral Once  . Warfarin - Pharmacist Dosing Inpatient   Does not apply q1800   Continuous Infusions:   PRN Meds:.acetaminophen, acetaminophen, morphine injection, ondansetron (ZOFRAN) IV, ondansetron, oxyCODONE    LOS: 3 days   Navin Dogan,CHRISTOPHER  [facility] Pager [phone]. If 8PM-8AM, please contact night-coverage at [email or web address], password [facility] 03/23/2013, 12:53 PM  LOS: 3 days    \

## 2013-03-23 NOTE — Progress Notes (Signed)
ANTICOAGULATION CONSULT NOTE - Follow-up Consult  Pharmacy Consult for warfarin  Indication: Hx DVT  Patient Measurements: Height: 5\' 6"  (167.6 cm) Weight: 137 lb 6.4 oz (62.324 kg) IBW/kg (Calculated) : 63.8  Vital Signs: Temp: 98.2 F (36.8 C) (06/24 0600) Temp src: Oral (06/24 0600) BP: 160/71 mmHg (06/24 0600) Pulse Rate: 62 (06/24 0600) Labs:  Recent Labs  03/20/13 1400 03/20/13 1834 03/20/13 2248 03/21/13 0532 03/22/13 0511 03/23/13 0505  HGB 9.2*  --   --  8.3* 8.6* 9.0*  HCT 28.4*  --   --  25.0* 26.9* 27.6*  PLT 127*  --   --  86* 83* 98*  APTT 41*  --   --   --   --   --   LABPROT 17.7*  --   --  17.3* 18.0* 19.2*  INR 1.50*  --   --  1.46 1.54* 1.68*  CREATININE 1.32  --   --  1.27 1.33 1.39*  TROPONINI  --  <0.30 <0.30 <0.30  --   --    Assessment: 89 YOM on chronic warfarin PTA for hx DVT admitted 6/21 for fall suffering multiple rib fractures.  CT head revealed no intracranial trauma. INR subtherapeutic (=1.46) on admission, per records from coumadin clinic visit 6/17 (INR=5.4), he was told to hold warfarin 17th-19th.   Home warfarin dose from 6/17: 1.5mg  TuThSu and 3mg  other days  Chronic anemia: CBC low, stable  Levofloxacin x1 on 6/21, potential to increase INR, no large increases in INR seen  Lovenox 40mg  SQ q24h until INR therapeutic  Coumadin 3mg  daily 6/22-23; INR 1.68.   INR remains subtherapeutic   Goal of Therapy:  INR 2-3 Monitor platelets by anticoagulation protocol: Yes   Plan:   Coumadin 4mg  PO x 1 today  Daily INR  Monitor CBC and for bleeding  D/C lovenox when INR >= 2  Otho Bellows PharmD Pager [phone] 03/23/2013, 11:10 AM

## 2013-03-23 NOTE — Clinical Social Work Psychosocial (Signed)
      Clinical Social Work Department BRIEF PSYCHOSOCIAL ASSESSMENT 03/23/2013  Patient:  Guy [name], Guy [name]     Account Number:  [id number]     Admit date:  03/20/2013  Clinical Social Worker:  Hattie Perch  Date/Time:  03/23/2013 12:00 M  Referred by:  Physician  Date Referred:  03/23/2013 Referred for  SNF Placement   Other Referral:   Interview type:   Other interview type:   family    PSYCHOSOCIAL DATA Living Status:  FAMILY Admitted from facility:   Level of care:   Primary support name:  Guy [name] Primary support relationship to patient:  SPOUSE Degree of support available:   good    CURRENT CONCERNS Current Concerns  Post-Acute Placement   Other Concerns:    SOCIAL WORK ASSESSMENT / PLAN CSW met with patient and patients spouse at bedside. patient in need of snf placement. patient's spouse requesting [facility] as her daughter is a resident there.   Assessment/plan status:   Other assessment/ plan:   Information/referral to community resources:    PATIENTS/FAMILYS RESPONSE TO PLAN OF CARE: spouse requesting [facility] for snf placement.

## 2013-03-23 NOTE — Discharge Summary (Addendum)
Physician Discharge Summary  Guy [name] ZOX:[id number] DOB: 1919-10-31 DOA: 03/20/2013  PCP: Junious Silk, MD  Admit date: 03/20/2013 Discharge date: 03/23/2013  Time spent: 40 minutes  Recommendations for Outpatient Follow-up:  1. Follow up with PMD. 2. Follow up with SNF MD.   Discharge Diagnoses:  Principal Problem:   Fracture of multiple ribs Active Problems:   Gait instability   DM (diabetes mellitus)   DVT of lower extremity, bilateral   Discharge Condition: Satisfactory  Diet recommendation: Heart-Healthy/Carbohydrate-Modified.   Filed Weights   03/20/13 1817  Weight: 62.324 kg (137 lb 6.4 oz)    History of present illness:  79 year old male with history of HTN, s/p CVA, CAD, s/p stent to LAD, depression, bladder cancer, prostate cancer, s/p prostatectomy, urinary incontinence, s/p remote urinary sphincter surgery, s/p penile implant, DM, dyslipidemia, OA, osteoporosis, sciatica, vertigo, bilateral LE DVT on Coumadin for past 3 months, admitted in April for back pain, had an L3 kyphoplasty, MRSA pneumonia, Klebsiella UTI, status post intubation for one day on 12/29/12, discharged to SNF on 02/08/13, independent at home with a walker, with some difficulty. He presents following a fall. According to history provided by the wife, the patient usually loses balance with his walker and falls backwards. She however states that he has not had any falls since he was discharged from [facility], 02/08/13. Patient apparently lost his balance and fell. There was no loss of consciousness. Patient was able to get up and back in the chair and finish his lunch, before coming to the hospital. Admitted for further management.   Hospital Course:  1.Fall/Left rib fractures: Patient presented following a mechanical fall, complicated by left 6th, 7th and 8th rib fractures. Head CT scan was devoid of acute findings. Managed with analgesics and incentive spirometry, with satisfactory pain  control. For continued PT/OT. Of note, 2D echocardiogram revealed normal LV cavity size, moderate concentric hypertrophy, EF of 65% to 70% and no regional wall motion abnormalities. There was very mild aortic stenosis. Trivial regurgitation. Valve area: 1.95cm^2(VTI). Valve area: 1.82cm^2 (Vmax). Mitral valve: Calcified annulus. Mildly thickened leaflets. Left atrium, moderately to severely dilated.  2. Fever: Patient was noted to have a low grade temperature of 100.3, in the ED. CXR was devoid of acute findings, urinalysis appeared unimpressive. Admitting MD commenced empiric Levaquin, however septic work up, including blood and urine cultures, proved negative. Wcc remained normal and patient remained afebrile. Discontinued antibiotics on 03/22/13, without deleterious effect.  3. Hypertension: BP is controlled on pre-admission anti-hypertensives.  4. Diabetes Mellitus: This is insulin-requiring type 2. Managed with diet/SSI/Lantus, with satisfactory control. HBA1C is 7.4.  5. Urinary incontinence: This is a long-standing problem, complicated in the past, by recurrent UTIs. Patient reportedly, has practiced intermittent self-catheterization in the past, and follows with Dr. Celso Amy at [facility], as he does have a rather complicated urologic history. Currently has Foley catheter, which shall remain in situ, till his follow up at [facility]. U/A is negative.  6. VTE: Patient was on Coumadin anticoagulation pre-admission, for bilateral LE DVTs. This has been continued. Anticoagulation was managed by Pharmacy, during this hospitalization.  7. Anemia: This is chronic, normocytic, and likely due to chronic disease. HB appears reasonable at this time.      Procedures:  See Below.   Consultations:  N/A.   Discharge Exam: Filed Vitals:   03/22/13 1430 03/22/13 2208 03/23/13 0600 03/23/13 1450  BP: 104/61 151/68 160/71 125/63  Pulse: 53 66 62 64  Temp: 98 F (  36.7 C) 98.6 F (37 C)  98.2 F (36.8 C) 97.8 F (36.6 C)  TempSrc: Oral Oral Oral Oral  Resp: 18 18 18 18   Height:      Weight:      SpO2: 97% 93% 96% 98%    General: Comfortable in chair. Alert, communicative, fully oriented, not short of breath at rest.  HEENT: Mild-moderate clinical pallor, no jaundice, no conjunctival injection or discharge.  NECK: Supple, JVP not seen, no carotid bruits, no palpable lymphadenopathy, no palpable goiter.  CHEST: Clinically clear to auscultation, no wheezes, no crackles. Tender to palpation lefty lower rib cage, albeit less.  HEART: Sounds 1 and 2 heard, normal, regular, no murmurs.  ABDOMEN: Full, soft, non-tender, no palpable organomegaly, no palpable masses, normal bowel sounds.  GENITALIA: Not examined.  LOWER EXTREMITIES: No pitting edema, palpable peripheral pulses.  MUSCULOSKELETAL SYSTEM: Generalized osteoarthritic changes, otherwise, normal.  CENTRAL NERVOUS SYSTEM: No focal neurologic deficit on gross examination.     Discharge Instructions      Discharge Orders   Future Orders Complete By Expires     Diet - low sodium heart healthy  As directed     Increase activity slowly  As directed         Medication List    STOP taking these medications       glimepiride 4 MG tablet  Commonly known as:  AMARYL     hydrochlorothiazide 25 MG tablet  Commonly known as:  HYDRODIURIL     HYDROcodone-acetaminophen 5-325 MG per tablet  Commonly known as:  NORCO/VICODIN      TAKE these medications       acetaminophen 325 MG tablet  Commonly known as:  TYLENOL  Take 650 mg by mouth 3 (three) times daily as needed for pain.     amLODipine 10 MG tablet  Commonly known as:  NORVASC  Take 10 mg by mouth daily.     bicalutamide 50 MG tablet  Commonly known as:  CASODEX  Take 50 mg by mouth daily.     Biotin 5 MG Tabs  Take 5 mg by mouth daily.     CALCIUM 600+D HIGH POTENCY 600-400 MG-UNIT per tablet  Generic drug:  Calcium Carbonate-Vitamin D  Take 1  tablet by mouth 2 (two) times daily.     docusate sodium 100 MG capsule  Commonly known as:  COLACE  Take 200 mg by mouth 2 (two) times daily as needed for constipation.     enoxaparin 40 MG/0.4ML injection  Commonly known as:  LOVENOX  Inject 0.4 mLs (40 mg total) into the skin daily.     fluocinonide cream 0.05 %  Commonly known as:  LIDEX  Apply 1 application topically 2 (two) times daily as needed (for skin irritation).     fluticasone 50 MCG/ACT nasal spray  Commonly known as:  FLONASE  Place 2 sprays into the nose 2 (two) times daily.     hydrOXYzine 25 MG tablet  Commonly known as:  ATARAX/VISTARIL  Take 25 mg by mouth 3 (three) times daily as needed for itching.     insulin aspart 100 UNIT/ML injection  Commonly known as:  novoLOG  Inject 3 Units into the skin 3 (three) times daily before meals. 0-150=0; 151-200=2u; 201-250=3u; 251-300=4u; 301-350=5u; over 350 =6u prior to meals     insulin glargine 100 UNIT/ML injection  Commonly known as:  LANTUS  Inject 0.16 mLs (16 Units total) into the skin at bedtime.  lipase/protease/amylase 16109 UNITS Cpep  Commonly known as:  CREON-12/PANCREASE  Take 4 capsules by mouth 3 (three) times daily.     losartan 50 MG tablet  Commonly known as:  COZAAR  Take 50 mg by mouth daily.     LUPRON DEPOT IM  Inject 1 Syringe into the muscle every 6 (six) months.     meclizine 25 MG tablet  Commonly known as:  ANTIVERT  Take 25 mg by mouth 3 (three) times daily as needed for dizziness.     Melatonin 3 MG Tabs  Take 3 mg by mouth at bedtime.     metoprolol tartrate 25 MG tablet  Commonly known as:  LOPRESSOR  Take 1 tablet (25 mg total) by mouth 2 (two) times daily.     multivitamins ther. w/minerals Tabs  Take 1 tablet by mouth every morning.     nitroGLYCERIN 0.4 MG SL tablet  Commonly known as:  NITROSTAT  Place 0.4 mg under the tongue every 5 (five) minutes as needed for chest pain.     omeprazole 20 MG capsule   Commonly known as:  PRILOSEC  Take 20 mg by mouth 2 (two) times daily.     oxybutynin 5 MG tablet  Commonly known as:  DITROPAN  Take 5 mg by mouth at bedtime.     oxybutynin 5 MG tablet  Commonly known as:  DITROPAN  Take 2.5 mg by mouth at bedtime.     oxyCODONE 5 MG immediate release tablet  Commonly known as:  Oxy IR/ROXICODONE  Take 1 tablet (5 mg total) by mouth every 4 (four) hours as needed.     polyethylene glycol packet  Commonly known as:  MIRALAX / GLYCOLAX  Take 17 g by mouth daily.     pramoxine 1 % Lotn  Commonly known as:  SARNA SENSITIVE  Apply topically 2 times daily.     RESTASIS 0.05 % ophthalmic emulsion  Generic drug:  cycloSPORINE  Place 1 drop into both eyes 2 (two) times daily.     sertraline 100 MG tablet  Commonly known as:  ZOLOFT  Take 100 mg by mouth daily.     simvastatin 40 MG tablet  Commonly known as:  ZOCOR  Take 40 mg by mouth at bedtime.     sodium chloride 0.65 % nasal spray  Commonly known as:  OCEAN  Place 1 spray into the nose as needed for congestion.     traMADol 50 MG tablet  Commonly known as:  ULTRAM  Take 0.5 tablets (25 mg total) by mouth 3 (three) times daily.     warfarin 3 MG tablet  Patient to take 1.5 mg po every Tuesday, Thursday, and Sunday All other days patient is to take 3 mg po daily.         Allergies  Allergen Reactions  . Albuterol Sulfate Hfa (UEA:[id number]) Shortness Of Breath and Swelling  . Adhesive (Tape) Other (See Comments)    REACTION: SKIN BLISTERS  . Albuterol Swelling    Per pt., side of his neck began to swell with nebulized Albuterol in doctor's office.  . Contrast Media (Iodinated Diagnostic Agents) Other (See Comments)    Reaction unknown  . Lactose     Other reaction(s): GI Upset (intolerance)  . Norvasc (Amlodipine Besylate) Other (See Comments)    Reaction unknown  . Penicillins Other (See Comments) and Itching    REACTION: ITCHING HANDS  . Sulfa Drugs Cross Reactors  Hives  . Sulfamethoxazole Rash  Follow-up Information   Follow up with Junious Silk, MD.   Contact information:   [address] [place] [address] [phone]        The results of significant diagnostics from this hospitalization (including imaging, microbiology, ancillary and laboratory) are listed below for reference.    Significant Diagnostic Studies: Dg Chest 2 View  03/20/2013   *RADIOLOGY REPORT*  Clinical Data: Fall, left-sided pain  CHEST - 2 VIEW  Comparison: Chest radiograph 03/20/2013  Findings: Stable cardiac silhouette.  There is chronic interstitial lung markings.  No effusion, infiltrate, pneumothorax.  Small effusion on the lateral projection.  Diffuse osteopenia.  Remote fracture of the right humerus. Nondisplaced fractures of the upper lateral left ribs again demonstrated.  No pneumothorax.  IMPRESSION:  1.  Chronic interstitial lung disease. 2.  Bilateral small effusions. 3.  Nondisplaced fractures on left again demonstrated.  No pneumothorax.   Original Report Authenticated By: Genevive Bi, M.D.   Dg Ribs Unilateral W/chest Left  03/20/2013   *RADIOLOGY REPORT*  Clinical Data: Fall with left-sided rib pain.  LEFT RIBS AND CHEST - 3+ VIEW  Comparison: Chest x-ray on 12/30/2012  Findings: Chest shows stable chronic disease.  No evidence of pneumothorax, pulmonary consolidation or pleural effusion.  The heart size and mediastinal contours are stable and within normal limits.  Left-sided rib films show mildly displaced fractures involving the sixth, seventh and eighth ribs.  IMPRESSION: Mildly displaced fractures involving the sixth, seventh and eighth left ribs.  No pneumothorax.   Original Report Authenticated By: Irish Lack, M.D.   Dg Pelvis 1-2 Views  03/20/2013   *RADIOLOGY REPORT*  Clinical Data: Fall with left hip pain.  PELVIS - 1-2 VIEW  Comparison: None.  Findings: No acute fracture or dislocation is identified.  There are  proliferative changes involving the lateral left iliac bone. Methylmethacrylate is visualized in the L3 vertebral body. Multiple clips are present in the pelvis related to prior surgery.  IMPRESSION: No acute fracture identified.   Original Report Authenticated By: Irish Lack, M.D.   Dg Elbow 2 Views Left  03/20/2013   *RADIOLOGY REPORT*  Clinical Data: Fall with left elbow pain.  LEFT ELBOW - 2 VIEW  Comparison: None.  Findings: No acute fracture, dislocation or joint effusion is identified.  No incidental bony lesions.  Surrounding soft tissues are unremarkable.  IMPRESSION: Normal left elbow.   Original Report Authenticated By: Irish Lack, M.D.   Ct Head Wo Contrast  03/21/2013   *RADIOLOGY REPORT*  Clinical Data: Fall while on anticoagulation therapy  CT HEAD WITHOUT CONTRAST  Technique:  Contiguous axial images were obtained from the base of the skull through the vertex without contrast.  Comparison: 12/29/2012  Findings: No intracranial hemorrhage.  No parenchymal contusion. No midline shift or mass effect.  Basilar cisterns are patent. No skull base fracture.  No fluid in the paranasal sinuses or mastoid air cells.  Atrophy and microvascular disease are unchanged.  There is mucosal thickening in the right maxillary sinus unchanged from prior.  IMPRESSION:  1.  No acute intracranial trauma. 2.  No change from prior.   Original Report Authenticated By: Genevive Bi, M.D.    Microbiology: Recent Results (from the past 240 hour(s))  URINE CULTURE     Status: None   Collection Time    03/20/13  2:00 PM      Result Value Range Status   Specimen Description URINE, CATHETERIZED   Final   Special Requests NONE  Final   Culture  Setup Time 03/20/2013 22:44   Final   Colony Count NO GROWTH   Final   Culture NO GROWTH   Final   Report Status 03/21/2013 FINAL   Final  CULTURE, BLOOD (ROUTINE X 2)     Status: None   Collection Time    03/20/13  6:34 PM      Result Value Range Status    Specimen Description BLOOD LEFT ARM   Final   Special Requests BOTTLES DRAWN AEROBIC AND ANAEROBIC    Final   Culture  Setup Time 03/20/2013 23:01   Final   Culture     Final   Value:        BLOOD CULTURE RECEIVED NO GROWTH TO DATE CULTURE WILL BE HELD FOR 5 DAYS BEFORE ISSUING A FINAL NEGATIVE REPORT   Report Status PENDING   Incomplete  CULTURE, BLOOD (ROUTINE X 2)     Status: None   Collection Time    03/20/13  6:38 PM      Result Value Range Status   Specimen Description BLOOD LEFT HAND   Final   Special Requests BOTTLES DRAWN AEROBIC ONLY    Final   Culture  Setup Time 03/20/2013 23:01   Final   Culture     Final   Value:        BLOOD CULTURE RECEIVED NO GROWTH TO DATE CULTURE WILL BE HELD FOR 5 DAYS BEFORE ISSUING A FINAL NEGATIVE REPORT   Report Status PENDING   Incomplete  MRSA PCR SCREENING     Status: None   Collection Time    03/20/13 11:41 PM      Result Value Range Status   MRSA by PCR NEGATIVE  NEGATIVE Final   Comment:            The GeneXpert MRSA Assay (FDA     approved for NASAL specimens     only), is one component of a     comprehensive MRSA colonization     surveillance program. It is not     intended to diagnose MRSA     infection nor to guide or     monitor treatment for     MRSA infections.  URINE CULTURE     Status: None   Collection Time    03/21/13  5:17 AM      Result Value Range Status   Specimen Description URINE, CATHETERIZED   Final   Special Requests NONE   Final   Culture  Setup Time 03/21/2013 15:22   Final   Colony Count NO GROWTH   Final   Culture NO GROWTH   Final   Report Status 03/22/2013 FINAL   Final     Labs: Basic Metabolic Panel:  Recent Labs Lab 03/20/13 1400 03/21/13 0532 03/22/13 0511 03/23/13 0505  NA 131* 133* 135 134*  K 4.1 4.0 4.4 4.1  CL 98 102 103 101  CO2 26 24 28 28   GLUCOSE 275* 122* 118* 99  BUN 19 18 17 19   CREATININE 1.32 1.27 1.33 1.39*  CALCIUM 8.7 8.2* 8.7 8.6   Liver Function  Tests:  Recent Labs Lab 03/20/13 1834 03/21/13 0532  AST 52* 41*  ALT 23 20  ALKPHOS 126* 106  BILITOT 0.3 0.3  PROT 7.3 6.5  ALBUMIN 2.5* 2.2*   No results found for this basename: LIPASE, AMYLASE,  in the last 168 hours No results found for this basename: AMMONIA,  in the  last 168 hours CBC:  Recent Labs Lab 03/20/13 1400 03/21/13 0532 03/22/13 0511 03/23/13 0505  WBC 4.8 5.4 5.3 5.7  NEUTROABS 3.4  --   --   --   HGB 9.2* 8.3* 8.6* 9.0*  HCT 28.4* 25.0* 26.9* 27.6*  MCV 92.8 92.9 93.1 92.6  PLT 127* 86* 83* 98*   Cardiac Enzymes:  Recent Labs Lab 03/20/13 1834 03/20/13 2248 03/21/13 0532  TROPONINI <0.30 <0.30 <0.30   BNP: BNP (last 3 results) No results found for this basename: PROBNP,  in the last 8760 hours CBG:  Recent Labs Lab 03/22/13 1118 03/22/13 1648 03/22/13 2140 03/23/13 0744 03/23/13 1214  GLUCAP 160* 91 98 85 138*       Signed:  OTI,CHRISTOPHER  [facility] 03/23/2013, 4:27 PM   At this point plan will be to discharge patient on Warfarin 1.5 mg po on Tuesday, Thursday, and Sunday.  All other days pt is to take 3 mg po daily.  Will discontinue lovenox on discharge as per pharmacy recommendations.  Will discharge to SNF.  Stable for discharge.  Thadius Smisek, [employer]

## 2013-03-23 NOTE — Progress Notes (Signed)
Physical Therapy Treatment Patient Details Name: Guy [name] MRN: [id number] DOB: 08/19/1920 Today's Date: 03/23/2013 Time: 4098-1191 PT Time Calculation (min): 25 min  PT Assessment / Plan / Recommendation Comments on Treatment Session  Progressing slowly with mobility. Still llimited significantly by pain.    Follow Up Recommendations  SNF     Does the patient have the potential to tolerate intense rehabilitation     Barriers to Discharge        Equipment Recommendations  None recommended by PT    Recommendations for Other Services    Frequency Min 3X/week   Plan Discharge plan remains appropriate    Precautions / Restrictions Precautions Precautions: Fall Restrictions Weight Bearing Restrictions: No   Pertinent Vitals/Pain L side 8/10 with activity    Mobility  Bed Mobility Bed Mobility: Supine to Sit Supine to Sit: 3: Mod assist;HOB elevated;With rails Details for Bed Mobility Assistance: Increased time and heavy reliance on rail. VCS safety, technique, hand placement. Assist for bil LEs off bed and trunk to upright. Utilized bedpad to assist with scooting, positioning Transfers Transfers: Sit to Stand;Stand to Sit Sit to Stand: 4: Min assist;From bed;From chair/3-in-1 Stand to Sit: 4: Min assist;To chair/3-in-1 Details for Transfer Assistance: x2. assist to rise, stabilize, control descent. VCS safety, technique, hand placement. Increased time.  Ambulation/Gait Ambulation/Gait Assistance: 4: Min assist Ambulation Distance (Feet): 30 Feet (x2) Assistive device: Rolling walker Ambulation/Gait Assistance Details: VCs safety, technique, distance from RW. slow gait speed. Assist to stabilize throughout ambulation. O2 sats 88% on RA. Seated rest break between walks.  Gait Pattern: Step-through pattern;Decreased stride length;Trunk flexed    Exercises General Exercises - Lower Extremity Ankle Circles/Pumps: AROM;Both;15 reps;Seated Quad Sets: AROM;Both;15  reps;Seated Hip ABduction/ADduction: AROM;Both;15 reps;Seated   PT Diagnosis:    PT Problem List:   PT Treatment Interventions:     PT Goals Acute Rehab PT Goals Pt will go Supine/Side to Sit: with supervision PT Goal: Supine/Side to Sit - Progress: Progressing toward goal Pt will go Sit to Stand: with supervision PT Goal: Sit to Stand - Progress: Progressing toward goal Pt will Ambulate: 51 - 150 feet;with supervision;with rolling walker PT Goal: Ambulate - Progress: Progressing toward goal  Visit Information  Last PT Received On: 03/23/13 Assistance Needed: +1    Subjective Data  Subjective: I couldn't get OOB this am Patient Stated Goal: less pain. home   Cognition  Cognition Arousal/Alertness: Awake/alert Behavior During Therapy: WFL for tasks assessed/performed Overall Cognitive Status: Within Functional Limits for tasks assessed    Balance     End of Session PT - End of Session Activity Tolerance: Patient limited by fatigue;Patient limited by pain Patient left: in chair;with call bell/phone within reach   GP     Rebeca Alert, MPT Pager: [phone]

## 2013-03-24 ENCOUNTER — Ambulatory Visit: Payer: Medicare Other | Admitting: Pharmacist Clinician (PhC)/ Clinical Pharmacy Specialist

## 2013-03-24 LAB — GLUCOSE, CAPILLARY: Glucose-Capillary: 151 mg/dL — ABNORMAL HIGH (ref 70–99)

## 2013-03-24 MED ORDER — WARFARIN SODIUM 3 MG PO TABS: 3.0000 mg | ORAL_TABLET | ORAL | Status: DC

## 2013-03-24 NOTE — Progress Notes (Signed)
Received 2 alarms and 1 text message stating leads off, phoned central telemetry to remind them patient telemetry had been discontinued before any of these alerts sent

## 2013-03-24 NOTE — Progress Notes (Signed)
Notified [facility] central telemetry that telemetry being discontinued due to discharge.  Telemetry removed

## 2013-03-24 NOTE — Progress Notes (Signed)
Patient in bathroom, lead off, replaced but did not receive alert/text page from central telemetry.  Spoke with [facility] moments later who verified no alert sent.

## 2013-03-24 NOTE — Progress Notes (Addendum)
CSW provided spouse with bed offers. She is agreeable to [facility].  Shawnita Krizek C. Javel Hersh MSW, LCSW [phone] Patient cleared for discharge. Packet copied and placed in [place]. [facility] called for transportation. Spouse aware and agreeable.  Payson Evrard C. Zyon Grout MSW, LCSW [phone]

## 2013-03-24 NOTE — Progress Notes (Signed)
Attempted to call report to [facility], told they did not know who would be taking patient, and to call back later.  Informed patient was going to be transported via [facility] and they had been called already to transport patient.

## 2013-03-25 ENCOUNTER — Non-Acute Institutional Stay (SKILLED_NURSING_FACILITY): Payer: Medicare Other | Admitting: Internal Medicine

## 2013-03-25 DIAGNOSIS — I82409 Acute embolism and thrombosis of unspecified deep veins of unspecified lower extremity: Secondary | ICD-10-CM

## 2013-03-25 DIAGNOSIS — D638 Anemia in other chronic diseases classified elsewhere: Secondary | ICD-10-CM

## 2013-03-25 DIAGNOSIS — I82403 Acute embolism and thrombosis of unspecified deep veins of lower extremity, bilateral: Secondary | ICD-10-CM

## 2013-03-25 DIAGNOSIS — I10 Essential (primary) hypertension: Secondary | ICD-10-CM

## 2013-03-26 LAB — CULTURE, BLOOD (ROUTINE X 2): Culture: NO GROWTH

## 2013-03-29 ENCOUNTER — Non-Acute Institutional Stay (SKILLED_NURSING_FACILITY): Payer: Medicare Other | Admitting: Adult Health

## 2013-03-29 DIAGNOSIS — M25511 Pain in right shoulder: Secondary | ICD-10-CM

## 2013-03-29 DIAGNOSIS — I82409 Acute embolism and thrombosis of unspecified deep veins of unspecified lower extremity: Secondary | ICD-10-CM

## 2013-03-29 DIAGNOSIS — Z7901 Long term (current) use of anticoagulants: Secondary | ICD-10-CM

## 2013-03-29 DIAGNOSIS — M25519 Pain in unspecified shoulder: Secondary | ICD-10-CM

## 2013-03-29 DIAGNOSIS — I82403 Acute embolism and thrombosis of unspecified deep veins of lower extremity, bilateral: Secondary | ICD-10-CM

## 2013-03-30 ENCOUNTER — Encounter: Payer: Self-pay | Admitting: Adult Health

## 2013-03-30 DIAGNOSIS — M25519 Pain in unspecified shoulder: Secondary | ICD-10-CM | POA: Insufficient documentation

## 2013-03-30 DIAGNOSIS — R0602 Shortness of breath: Secondary | ICD-10-CM | POA: Insufficient documentation

## 2013-03-30 NOTE — Progress Notes (Signed)
  Subjective:    Patient ID: Guy [name], male    DOB: Dec 09, 1919, 79 y.o.   MRN: [id number]  HPI This is a 79 year old male who had shortness of breath. No coughing but has T= 99 degrees F. He fell at home and has multiple rib fractures. Latest INR 3.1 - slightly supratherapeutic. No bruising nor bleeding noted. Coumadin held x 2 days due to supratherapeutic INR. He is on chronic Coumadin therapy due to bilateral LE DVT.   Review of Systems  Constitutional: Positive for fever.  HENT: Negative.   Eyes: Negative.   Respiratory: Positive for shortness of breath. Negative for cough and wheezing.   Cardiovascular: Positive for leg swelling.  Gastrointestinal: Negative for abdominal distention.  Endocrine: Negative.   Genitourinary: Negative.   Neurological: Negative.   Hematological: Negative for adenopathy. Does not bruise/bleed easily.  Psychiatric/Behavioral: Negative.        Objective:   Physical Exam  Nursing note and vitals reviewed. Constitutional: He is oriented to person, place, and time. He appears well-developed and well-nourished.  HENT:  Head: Normocephalic and atraumatic.  Right Ear: External ear normal.  Left Ear: External ear normal.  Nose: Nose normal.  Mouth/Throat: Oropharynx is clear and moist.  Eyes: Conjunctivae and EOM are normal. Pupils are equal, round, and reactive to light.  Neck: Normal range of motion. Neck supple.  Cardiovascular: Normal rate, regular rhythm, normal heart sounds and intact distal pulses.   Pulmonary/Chest: Effort normal and breath sounds normal.  Abdominal: Soft. Bowel sounds are normal.  Musculoskeletal: He exhibits edema and tenderness.  L shoulder has limited ROM due to tenderness BLE edema, 2+  Neurological: He is alert and oriented to person, place, and time.  Skin: Skin is warm and dry.  Psychiatric: He has a normal mood and affect. His behavior is normal. Judgment and thought content normal.    LABS?PROCEDURE: 03/29/13   Left shoulder x-ray shows marked degenerative change acromioclavicular joint and chest x-ray shows mild to moderate chf 03/23/13  NA 134  K 4.1  Glucose 99  BUN 19  Creatinine 1.39  Calcium 8.6  Wbc 5.7  hgb 9.0  hct 27.6   Medications reviewed per [facility]      Assessment & Plan:   Pain in joint, shoulder region - continue  Ultram 50 mg 1/2 tab = 25 mg PO TID  Long term (current) use of anticoagulants - start Coumadin 2 mg PO Q D and repeat INR on 04/01/13   DVT of lower extremity, bilateral - stable

## 2013-04-01 ENCOUNTER — Non-Acute Institutional Stay (SKILLED_NURSING_FACILITY): Payer: Medicare Other | Admitting: Adult Health

## 2013-04-01 DIAGNOSIS — Z7901 Long term (current) use of anticoagulants: Secondary | ICD-10-CM

## 2013-04-01 DIAGNOSIS — I82403 Acute embolism and thrombosis of unspecified deep veins of lower extremity, bilateral: Secondary | ICD-10-CM

## 2013-04-01 DIAGNOSIS — I82409 Acute embolism and thrombosis of unspecified deep veins of unspecified lower extremity: Secondary | ICD-10-CM

## 2013-04-05 ENCOUNTER — Non-Acute Institutional Stay (SKILLED_NURSING_FACILITY): Payer: Medicare Other | Admitting: Adult Health

## 2013-04-05 DIAGNOSIS — E119 Type 2 diabetes mellitus without complications: Secondary | ICD-10-CM

## 2013-04-09 ENCOUNTER — Non-Acute Institutional Stay (SKILLED_NURSING_FACILITY): Payer: Medicare Other | Admitting: Adult Health

## 2013-04-09 ENCOUNTER — Encounter: Payer: Self-pay | Admitting: Adult Health

## 2013-04-09 DIAGNOSIS — I82403 Acute embolism and thrombosis of unspecified deep veins of lower extremity, bilateral: Secondary | ICD-10-CM

## 2013-04-09 DIAGNOSIS — Z7901 Long term (current) use of anticoagulants: Secondary | ICD-10-CM

## 2013-04-09 DIAGNOSIS — I82409 Acute embolism and thrombosis of unspecified deep veins of unspecified lower extremity: Secondary | ICD-10-CM

## 2013-04-09 NOTE — Progress Notes (Addendum)
Patient ID: Guy [name], male   DOB: [birth year], 79 y.o.   MRN: [id number] Subjective:     Indication: DVT Bleeding signs/symptoms: None Thromboembolic signs/symptoms: None  Missed Coumadin doses: None Medication changes: no Dietary changes: no Bacterial/viral infection: no Other concerns: no  The following portions of the patient's history were reviewed and updated as appropriate: allergies, current medications, past family history, past medical history, past social history, past surgical history and problem list.  Review of Systems A comprehensive review of systems was negative.   Objective:    INR Today: 1.4 Current dose: Coumadin 2 by mouth daily  Assessment:    Subtherapeutic INR for goal of 2-3   Plan:    1. New dose: Coumadin 3 mg by mouth x1 today then Coumadin 2.5 mg by mouth daily 2. Next INR: 04/06/13

## 2013-04-09 NOTE — Progress Notes (Signed)
  Subjective:    Patient ID: Guy [name], male    DOB: [birth year], 79 y.o.   MRN: [id number]  HPI This is a 79 year old male who had an episode of hypoglycemia. A.m. blood sugar = 52. Standing order for a group of: 1 mg IM G. then and recheck CBG = 69. Patient is alert and oriented.    Review of Systems  Constitutional: Negative.   HENT: Negative.   Eyes: Negative.   Respiratory: Negative for shortness of breath.   Cardiovascular: Positive for leg swelling.  Gastrointestinal: Negative for abdominal pain and abdominal distention.  Endocrine: Negative for heat intolerance, polydipsia and polyphagia.  Genitourinary: Negative.   Neurological: Negative.   Hematological: Negative for adenopathy. Does not bruise/bleed easily.  Psychiatric/Behavioral: Negative.        Objective:   Physical Exam  Constitutional: He is oriented to person, place, and time. He appears well-developed and well-nourished.  HENT:  Head: Normocephalic and atraumatic.  Right Ear: External ear normal.  Left Ear: External ear normal.  Nose: Nose normal.  Mouth/Throat: Oropharynx is clear and moist.  Eyes: Conjunctivae and EOM are normal. Pupils are equal, round, and reactive to light.  Neck: Normal range of motion. Neck supple.  Cardiovascular: Normal rate, regular rhythm and normal heart sounds.   Pulmonary/Chest: Effort normal and breath sounds normal. No respiratory distress.  Abdominal: Soft. Bowel sounds are normal.  Musculoskeletal: Normal range of motion. He exhibits edema. He exhibits no tenderness.  BLE edema  Neurological: He is alert and oriented to person, place, and time.  Skin: Skin is warm and dry.  Psychiatric: He has a normal mood and affect. His behavior is normal. Judgment and thought content normal.    LABS/PROCEDURES: 03/30/13 WBC 8.4 hemoglobin 9.8 hematocrit 28.8 sodium 135 potassium 3.9 glucose 75 BUN 18 creatinine 1.24 calcium 8.5 BNP to 11.2 03/29/13 x-ray of left shoulder shows no  fracture; chest x-ray shows mild to moderate congestive heart failure, suspected prior granulomatous disease      Medications reviewed Assessment & Plan:   Diabetes Mellitus, type 2 - decrease Lantus to 12 units subcutaneous each bedtime

## 2013-04-09 NOTE — Progress Notes (Signed)
Patient ID: Guy [name], male   DOB: [birth year], 79 y.o.   MRN: [id number]  Subjective:     Indication: DVT Bleeding signs/symptoms: None Thromboembolic signs/symptoms: None  Missed Coumadin doses: None Medication changes: no Dietary changes: no Bacterial/viral infection: no Other concerns: no  The following portions of the patient's history were reviewed and updated as appropriate: allergies, current medications, past family history, past medical history, past social history, past surgical history and problem list.  Review of Systems A comprehensive review of systems was negative.   Objective:    INR Today: 1.8 Current dose: Coumadin 3 mg by mouth daily  Assessment:    Subtherapeutic INR for goal of 2-3   Plan:    1. New dose: Increase Coumadin 3.5 mg by mouth daily   2. Next INR: 04/13/13

## 2013-04-13 ENCOUNTER — Other Ambulatory Visit: Payer: Self-pay | Admitting: Geriatric Medicine

## 2013-04-13 ENCOUNTER — Non-Acute Institutional Stay (SKILLED_NURSING_FACILITY): Payer: Medicare Other | Admitting: Adult Health

## 2013-04-13 DIAGNOSIS — I82403 Acute embolism and thrombosis of unspecified deep veins of lower extremity, bilateral: Secondary | ICD-10-CM

## 2013-04-13 DIAGNOSIS — I82409 Acute embolism and thrombosis of unspecified deep veins of unspecified lower extremity: Secondary | ICD-10-CM

## 2013-04-13 DIAGNOSIS — Z7901 Long term (current) use of anticoagulants: Secondary | ICD-10-CM

## 2013-04-13 MED ORDER — OXYCODONE HCL 5 MG PO TABS: 5.0000 mg | ORAL_TABLET | ORAL | Status: DC | PRN

## 2013-04-16 ENCOUNTER — Non-Acute Institutional Stay (SKILLED_NURSING_FACILITY): Payer: Medicare Other | Admitting: Adult Health

## 2013-04-16 DIAGNOSIS — C800 Disseminated malignant neoplasm, unspecified: Secondary | ICD-10-CM

## 2013-04-16 DIAGNOSIS — S2249XS Multiple fractures of ribs, unspecified side, sequela: Secondary | ICD-10-CM

## 2013-04-16 DIAGNOSIS — I10 Essential (primary) hypertension: Secondary | ICD-10-CM

## 2013-04-16 DIAGNOSIS — I2510 Atherosclerotic heart disease of native coronary artery without angina pectoris: Secondary | ICD-10-CM

## 2013-04-16 DIAGNOSIS — K5900 Constipation, unspecified: Secondary | ICD-10-CM

## 2013-04-16 DIAGNOSIS — G4700 Insomnia, unspecified: Secondary | ICD-10-CM

## 2013-04-16 DIAGNOSIS — IMO0002 Reserved for concepts with insufficient information to code with codable children: Secondary | ICD-10-CM

## 2013-04-16 DIAGNOSIS — E119 Type 2 diabetes mellitus without complications: Secondary | ICD-10-CM

## 2013-04-16 DIAGNOSIS — R32 Unspecified urinary incontinence: Secondary | ICD-10-CM

## 2013-04-16 DIAGNOSIS — E785 Hyperlipidemia, unspecified: Secondary | ICD-10-CM

## 2013-04-16 DIAGNOSIS — C61 Malignant neoplasm of prostate: Secondary | ICD-10-CM

## 2013-04-16 DIAGNOSIS — K219 Gastro-esophageal reflux disease without esophagitis: Secondary | ICD-10-CM

## 2013-04-16 DIAGNOSIS — F329 Major depressive disorder, single episode, unspecified: Secondary | ICD-10-CM

## 2013-04-16 DIAGNOSIS — I82403 Acute embolism and thrombosis of unspecified deep veins of lower extremity, bilateral: Secondary | ICD-10-CM

## 2013-04-16 DIAGNOSIS — I82409 Acute embolism and thrombosis of unspecified deep veins of unspecified lower extremity: Secondary | ICD-10-CM

## 2013-04-22 ENCOUNTER — Telehealth: Payer: Self-pay | Admitting: Pharmacist Clinician (PhC)/ Clinical Pharmacy Specialist

## 2013-04-22 NOTE — Telephone Encounter (Signed)
Pt has hx of bilateral DVT during hospitalization in April.  Was placed on warfarin therapy at that time.  Since then has had several falls, including broken ribs.  HH states that patient is unstable on feet, high fall risk.  Reviewed with Dr. Allyson Sabal, will d/c warfarin as of today and start pt on ASA 81mg  for now.  RN was with patient and will relay information to family

## 2013-04-25 ENCOUNTER — Encounter: Payer: Self-pay | Admitting: Adult Health

## 2013-04-25 DIAGNOSIS — F329 Major depressive disorder, single episode, unspecified: Secondary | ICD-10-CM | POA: Insufficient documentation

## 2013-04-25 DIAGNOSIS — K219 Gastro-esophageal reflux disease without esophagitis: Secondary | ICD-10-CM | POA: Insufficient documentation

## 2013-04-25 DIAGNOSIS — G4700 Insomnia, unspecified: Secondary | ICD-10-CM | POA: Insufficient documentation

## 2013-04-25 NOTE — Progress Notes (Signed)
Patient ID: Guy [name], male   DOB: 1919-10-15, 79 y.o.   MRN: [id number] A little of Subjective:     Indication: DVT Bleeding signs/symptoms: None Thromboembolic signs/symptoms: None  Missed Coumadin doses: None Medication changes: no Dietary changes: no Bacterial/viral infection: no Other concerns: no  The following portions of the patient's history were reviewed and updated as appropriate: allergies, current medications, past family history, past medical history, past social history, past surgical history and problem list.  Review of Systems A comprehensive review of systems was negative.   Objective:    INR Today: 1.4 Current dose: Coumadin 3.5 mg by mouth x 1 today the Coumadin 4 mg PO Q D  Assessment:    Subtherapeutic INR for goal of 2-3   Plan:    1. New dose: Increase Coumadin 5 mg by mouth daily   2. Next INR: 04/16/13

## 2013-04-25 NOTE — Progress Notes (Signed)
  Subjective:    Patient ID: Guy [name], male    DOB: 1920-09-12, 79 y.o.   MRN: [id number]  HPI This is a 79 year old male who is for discharge home with home health Social worker, PT, OT and nursing. DME: Hospital bed due to diagnosis of multiple rib fractures sustained from a fall at home. Hospital bed will alleviate pain by allowing just to be positions base not feasible read a normal bed. Pain episodes frequently require frequent changes in body position which cannot be achieved with a normal bed. He has been admitted to [facility] place on 03/24/13 from [facility] with diagnosis of multiple left read fractures sustained from a fall at home. He has completed SNF rehabilitation and therapy has cleared the patient for discharge.   Review of Systems  Constitutional: Negative.   HENT: Negative.   Eyes: Negative.   Respiratory: Negative for cough and shortness of breath.   Cardiovascular: Positive for leg swelling.  Gastrointestinal: Negative for abdominal pain and abdominal distention.  Endocrine: Negative.   Genitourinary: Negative.   Neurological: Negative.   Psychiatric/Behavioral: Negative.        Objective:   Physical Exam  Nursing note and vitals reviewed. Constitutional: He is oriented to person, place, and time. He appears well-developed and well-nourished.  HENT:  Head: Normocephalic and atraumatic.  Right Ear: External ear normal.  Left Ear: External ear normal.  Nose: Nose normal.  Mouth/Throat: Oropharynx is clear and moist.  Eyes: Conjunctivae and EOM are normal. Pupils are equal, round, and reactive to light.  Neck: Normal range of motion. Neck supple.  Cardiovascular: Normal rate, normal heart sounds and intact distal pulses.   Pulmonary/Chest: Effort normal and breath sounds normal.  Abdominal: Soft. Bowel sounds are normal.  Musculoskeletal: Normal range of motion. He exhibits edema. He exhibits no tenderness.  BLE edema, 2+  Neurological: He is alert  and oriented to person, place, and time.  Skin: Skin is warm and dry.  Psychiatric: He has a normal mood and affect. His behavior is normal. Judgment and thought content normal.     LABS/PROCEDURES: 04/13/13 left shoulder x-ray shows a new nondisplaced fracture involving the lateral one third of the left clavicle which is acute or subacute. Left leads x-ray shows nondisplaced acute or subacute fractures involving the left fifth, sixth and seventh ribs posterolaterally. 03/30/13 WBC 8.4 hemoglobin 9.8 hematocrit 28.8 sodium 135 potassium 3.9 glucose 75 BUN 18 creatinine 1.24 calcium 8.5  BNP 211.2 lipid panel normal hemoglobin A1c 6.7       Medications reviewed.   Assessment & Plan:   Depression - stable  GERD (gastroesophageal reflux disease) - stable  Insomnia - stable  Urinary incontinence - stable  Fracture of multiple ribs - 4 home health PT, OT, social worker, and nursing  CAD (coronary artery disease) - stable  Hyperlipidemia - well controlled  Type II or unspecified type diabetes mellitus without mention of complication, not stated as uncontrolled - Well controlled  Unspecified constipation - stable  DVT of lower extremity, bilateral - stable  Malignant neoplasm of prostate - stable  Essential hypertension, benign  - well-controlled-      Total discharge time: > 30 minutes Discharge time involved coordination of the discharge process with social worker, nursing staff and therapy department. Medical justification for home health services/DME verified.    CPT CODE: 40981

## 2013-04-28 NOTE — Progress Notes (Signed)
Patient ID: Guy [name], male   DOB: Jul 20, 1920, 79 y.o.   MRN: [id number]        HISTORY & PHYSICAL  DATE: 03/25/2013   FACILITY: [facility]  LEVEL OF CARE: SNF (31)  ALLERGIES:  Allergies  Allergen Reactions  . Albuterol Sulfate Hfa ([id number]) Shortness Of Breath and Swelling  . Adhesive (Tape) Other (See Comments)    REACTION: SKIN BLISTERS  . Albuterol Swelling    Per pt., side of his neck began to swell with nebulized Albuterol in doctor's office.  . Contrast Media (Iodinated Diagnostic Agents) Other (See Comments)    Reaction unknown  . Lactose     Other reaction(s): GI Upset (intolerance)  . Norvasc (Amlodipine Besylate) Other (See Comments)    Reaction unknown  . Penicillins Other (See Comments) and Itching    REACTION: ITCHING HANDS  . Sulfa Drugs Cross Reactors Hives  . Sulfamethoxazole Rash    CHIEF COMPLAINT:  Manage diabetes mellitus, hypertension, and anemia of chronic disease.    HISTORY OF PRESENT ILLNESS:  The patient is a 79 year-old, Caucasian male who was hospitalized after a fall.  After hospitalization, he is admitted to this facility for short-term rehabilitation.   He has the following problems:    DM:pt's DM remains stable.  Pt denies polyuria, polydipsia, polyphagia, changes in vision or hypoglycemic episodes.  No complications noted from the medication presently being used.  Last hemoglobin A1c is:  7.4.  HTN: Pt 's HTN remains stable.  Denies CP, sob, DOE, pedal edema, headaches, dizziness or visual disturbances.  No complications from the medications currently being used.  Last BP :  125/63, 104/61.  ANEMIA: The anemia has been stable. The patient denies fatigue, melena or hematochezia.  The patient is currently not on iron.     PAST MEDICAL HISTORY :  Past Medical History  Diagnosis Date  . Hypertension   . Diabetes mellitus without complication   . Arthritis   . Sciatica   . GERD (gastroesophageal reflux disease)    . Urge incontinence   . Vertigo   . Hyperlipidemia   . ED (erectile dysfunction)   . Gastric polyposis   . Osteoporosis   . Decreased libido   . Depression   . Hepatitis A   . Stroke   . History of blood transfusion     1946  . OA (osteoarthritis)   . Bladder cancer   . Prostate cancer     S/P prostatectomy; Lung metastasis  . CAD (coronary artery disease) 1996    stent to the LAD   . Hyperlipidemia   . Type 2 diabetes mellitus     PAST SURGICAL HISTORY: Past Surgical History  Procedure Laterality Date  . Carpal tunnel release    . Cataract extraction w/ intraocular lens  implant, bilateral  1998  . Prostatectomy    . Urinary sphincter implant    . Urinary sphincter implant revision    . Appendectomy    . Left hip surgery      Donated bone for bone graft to arm  . Penile prosthesis implant      S/P removal and re-implantation of new prosthesis  . Middle ear surgery    . Finger surgery      Left and right  . Hernia repair    . Bladder surgery    . Right arm bone graft      Pathological fracture  . Cardiac catheterization  11/27/2001  patent stent with mild in-stent restenosis.  . Myoview  06/12/2009    Persantine myoview EF 76%; Normal myoview  . Coronary angioplasty  1996    stenting to the LAD in [place].     SOCIAL HISTORY:  reports that he has quit smoking. He has never used smokeless tobacco. He reports that he does not drink alcohol or use illicit drugs.  FAMILY HISTORY:  Family History  Problem Relation Age of Onset  . Heart failure Mother   . Bladder Cancer Father   . Pancreatic cancer Sister   . Lung cancer Sister   . Breast cancer Daughter   . Liver disease Son     CURRENT MEDICATIONS: Reviewed per [facility]  REVIEW OF SYSTEMS:  Difficult to obtain.  The patient has severe hearing loss.    PHYSICAL EXAMINATION  VS:  T 97.8       P 64      RR 18     BP 125/63      POX 98% room air        WT (Lb)  GENERAL: no acute distress, normal body  habitus EYES: conjunctivae normal, sclerae normal, normal eye lids MOUTH/THROAT: lips without lesions,no lesions in the mouth,tongue is without lesions,uvula elevates in midline NECK: supple, trachea midline, no neck masses, no thyroid tenderness, no thyromegaly LYMPHATICS: no LAN in the neck, no supraclavicular LAN RESPIRATORY: breathing is even & unlabored, BS CTAB CARDIAC: RRR, no murmur,no extra heart sounds, no edema GI:  ABDOMEN: abdomen soft, normal BS, no masses, no tenderness  LIVER/SPLEEN: no hepatomegaly, no splenomegaly MUSCULOSKELETAL: HEAD: normal to inspection & palpation BACK: no kyphosis, scoliosis or spinal processes tenderness EXTREMITIES: LEFT UPPER EXTREMITY: strength intact, range of motion unable to perform  RIGHT UPPER EXTREMITY: strength intact, range of motion unable to perform LEFT LOWER EXTREMITY: strength intact, range of motion minimal  RIGHT LOWER EXTREMITY: strength intact, range of motion minimal  PSYCHIATRIC: the patient is alert & oriented to person, affect & behavior appropriate  LABS/RADIOLOGY: 2D-echo showed EF of 65-70%.  No regional wall motion abnormalities.    CT of the head:  No acute findings.    Chest x-ray:  No acute findings.      Urinalysis, urine culture, and blood culture negative.    Pelvic x-ray:  No acute findings.   Left chest x-ray showed mildly displaced fractures of 6th, 7th, and 8th ribs.    Left elbow x-ray:  Normal.    MRSA by PCR negative.    Creatinine 1.39, otherwise BMP normal.    AST 41, albumin 2.2, otherwise liver profile normal.     Hemoglobin 9, MCV 92.6, platelets 98, WBC 5.7.    Troponin-I less than 0.3 x3.    ASSESSMENT/PLAN:   Diabetes mellitus.  Uncontrolled.  Monitor CBGs.    Hypertension.  Stable.   Anemia of chronic disease.  Reassess.  Bilateral DVTs.  Continue Coumadin.    Allergic rhinitis.  Continue current medications.   Prostate cancer.  Currently on Lupron injections.     Hyperlipidemia.  Continue simvastatin.   Urinary incontinence.  Continue oxybutynin.    Check CBC and BMP.    I have reviewed patient's medical records received at admission/from hospitalization.  CPT CODE: 84696

## 2013-04-29 ENCOUNTER — Ambulatory Visit: Payer: Medicare Other | Admitting: Pharmacist Clinician (PhC)/ Clinical Pharmacy Specialist

## 2013-04-29 DIAGNOSIS — D638 Anemia in other chronic diseases classified elsewhere: Secondary | ICD-10-CM | POA: Insufficient documentation

## 2013-04-30 ENCOUNTER — Telehealth: Payer: Self-pay | Admitting: Cardiovascular Disease

## 2013-04-30 NOTE — Telephone Encounter (Signed)
Returned call to [employer].  Stated they have an order to stop the coumadin, but no order to stop checking pt's INR.  Stated INR 1.2 today.  Buzz informed Dr. Allyson Sabal will be notified.  Verbalized understanding.  Message forwarded to Dr. Allyson Sabal for review and further instructions

## 2013-04-30 NOTE — Telephone Encounter (Signed)
Buzz needs a verbal order to stop coumadin.  Patient is no longer taking Coumadin.

## 2013-05-07 NOTE — Telephone Encounter (Signed)
Can D/C coumadin and INRs

## 2013-05-10 NOTE — Telephone Encounter (Signed)
Order to stop INRs was faxed in

## 2013-06-03 ENCOUNTER — Ambulatory Visit (INDEPENDENT_AMBULATORY_CARE_PROVIDER_SITE_OTHER): Payer: Medicare Other | Admitting: Cardiovascular Disease

## 2013-06-03 ENCOUNTER — Encounter: Payer: Self-pay | Admitting: Cardiovascular Disease

## 2013-06-03 VITALS — BP 128/60 | HR 50 | Ht 67.0 in | Wt 139.0 lb

## 2013-06-03 DIAGNOSIS — E785 Hyperlipidemia, unspecified: Secondary | ICD-10-CM

## 2013-06-03 DIAGNOSIS — I10 Essential (primary) hypertension: Secondary | ICD-10-CM

## 2013-06-03 DIAGNOSIS — I2510 Atherosclerotic heart disease of native coronary artery without angina pectoris: Secondary | ICD-10-CM

## 2013-06-03 NOTE — Assessment & Plan Note (Signed)
On statin therapy with recent liver profile performed 12/30/12 revealing toco showed 80, LDL of 37 and HDL of 26

## 2013-06-03 NOTE — Patient Instructions (Addendum)
Your physician wants you to follow-up in: 1 year with Dr Berry. You will receive a reminder letter in the mail two months in advance. If you don't receive a letter, please call our office to schedule the follow-up appointment.  

## 2013-06-03 NOTE — Assessment & Plan Note (Signed)
Status post remote LAD stenting in 1996 [place]. I catheterized him 11/27/01 revealed a patent LAD stent with mild in-stent restenosis. His last Myoview performed 06/12/09 was nonischemic. He denies chest pain or shortness of breath.

## 2013-06-03 NOTE — Progress Notes (Signed)
06/03/2013 Guy [name]   1920/03/17  [id number]  Primary Physician Junious Silk, MD Primary Cardiologist: Runell Gess MD Guy [name]   HPI:  The patient is a very pleasant 79 year old thin and frail appearing married Caucasian male, father of 3, grandfather of 4 great grandchildren who is accompanied by his wife today who is also a patient of mine. I last saw him 3 years ago. He has a history of CAD status post remote LAD stenting in 1996 in [place]. I catheterized him November 27, 2001 revealing a patent LAD stent with mild in-stent restenosis. His last Myoview performed in March of 2008 was nonischemic, as was one on June 12, 2009. His other problems include hypertension, hyperlipidemia, and non-insulin-requiring diabetes. He does have metastatic prostate cancer involving his lungs and ribs and has had multiple operations for these. He gets occasionally chest pain which is nitrate responsive. Since I saw him last he had hospitalizations for fractured vertebrae requiring kyphoplasty as well as urinary tract infection and surgery on his urethra. Since I saw him 4 months ago he has remained stable. He denies chest pain or shortness of breath. He said several episodes of falling several of which have been traumatic. He's had aspiration, brief intubation and cracked ribs. He walks with a walker.     Current Outpatient Prescriptions  Medication Sig Dispense Refill  . acetaminophen (TYLENOL) 325 MG tablet Take 650 mg by mouth 3 (three) times daily as needed for pain.      [name] amLODipine (NORVASC) 10 MG tablet Take 10 mg by mouth daily.      . bicalutamide (CASODEX) 50 MG tablet Take 50 mg by mouth daily.      . Calcium Carbonate-Vitamin D (CALCIUM 600+D HIGH POTENCY) 600-400 MG-UNIT per tablet Take 1 tablet by mouth 2 (two) times daily.        . cycloSPORINE (RESTASIS) 0.05 % ophthalmic emulsion Place 1 drop into both eyes 2 (two) times daily.        [name] docusate  sodium (COLACE) 100 MG capsule Take 200 mg by mouth 2 (two) times daily as needed for constipation.      . fluocinonide cream (LIDEX) 0.05 % Apply 1 application topically 2 (two) times daily as needed (for skin irritation).      . fluticasone (FLONASE) 50 MCG/ACT nasal spray Place 2 sprays into the nose 2 (two) times daily.      [name] glimepiride (AMARYL) 4 MG tablet Take 4 mg by mouth daily before breakfast.      . hydrOXYzine (ATARAX/VISTARIL) 25 MG tablet Take 25 mg by mouth 3 (three) times daily as needed for itching.      . insulin aspart (NOVOLOG) 100 UNIT/ML injection Inject 3 Units into the skin 3 (three) times daily before meals. 0-150=0; 151-200=2u; 201-250=3u; 251-300=4u; 301-350=5u; over 350 =6u prior to meals      . insulin glargine (LANTUS) 100 UNIT/ML injection Inject 0.16 mLs (16 Units total) into the skin at bedtime.  10 mL  12  . Leuprolide Acetate (LUPRON DEPOT IM) Inject 1 Syringe into the muscle every 6 (six) months.      . lipase/protease/amylase (CREON-12/PANCREASE) 12000 UNITS CPEP Take 4 capsules by mouth 3 (three) times daily.      [name] losartan (COZAAR) 50 MG tablet Take 50 mg by mouth daily.      . Melatonin 3 MG TABS Take 3 mg by mouth at bedtime.      . metoprolol tartrate (LOPRESSOR) 25  MG tablet Take 1 tablet (25 mg total) by mouth 2 (two) times daily.  60 tablet  0  . Multiple Vitamins-Minerals (MULTIVITAMINS THER. W/MINERALS) TABS Take 1 tablet by mouth every morning.       . nitroGLYCERIN (NITROSTAT) 0.4 MG SL tablet Place 0.4 mg under the tongue every 5 (five) minutes as needed for chest pain.       [name] omeprazole (PRILOSEC) 20 MG capsule Take 20 mg by mouth 2 (two) times daily.        [name] oxybutynin (DITROPAN) 5 MG tablet Take 5 mg by mouth at bedtime.      . polyethylene glycol (MIRALAX / GLYCOLAX) packet Take 17 g by mouth daily.  14 each  0  . pramoxine (SARNA SENSITIVE) 1 % LOTN Apply topically 2 times daily.      . sertraline (ZOLOFT) 100 MG tablet Take 100 mg by  mouth daily.      . simvastatin (ZOCOR) 40 MG tablet Take 40 mg by mouth at bedtime.      . sodium chloride (OCEAN) 0.65 % nasal spray Place 1 spray into the nose as needed for congestion.       No current facility-administered medications for this visit.    Allergies  Allergen Reactions  . Albuterol Sulfate Hfa [Kdc:Albuterol] Shortness Of Breath and Swelling  . Adhesive [Tape] Other (See Comments)    REACTION: SKIN BLISTERS  . Albuterol Swelling    Per pt., side of his neck began to swell with nebulized Albuterol in doctor's office.  . Amlodipine Other (See Comments)    Reaction unknown  . Contrast Media [Iodinated Diagnostic Agents] Other (See Comments)    Reaction unknown  . Lactose     Other reaction(s): GI Upset (intolerance)  . Metrizamide Other (See Comments)    Reaction unknown  . Norvasc [Amlodipine Besylate] Other (See Comments)    Reaction unknown  . Penicillins Other (See Comments) and Itching    REACTION: ITCHING HANDS  . Sulfa Drugs Cross Reactors Hives  . Sulfamethoxazole Rash    History   Social History  . Marital Status: Married    Spouse Name: [name]    Number of Children: 3  . Years of Education: N/A   Occupational History  . Business owner, Airline pilot, retired    Social History Main Topics  . Smoking status: Former Games developer  . Smokeless tobacco: Never Used  . Alcohol Use: No  . Drug Use: No  . Sexual Activity: No   Other Topics Concern  . Not on file   Social History Narrative   Married.  Lives with his wife.  Ambulates with a walker and a cane.     Review of Systems: General: negative for chills, fever, night sweats or weight changes.  Cardiovascular: negative for chest pain, dyspnea on exertion, edema, orthopnea, palpitations, paroxysmal nocturnal dyspnea or shortness of breath Dermatological: negative for rash Respiratory: negative for cough or wheezing Urologic: negative for hematuria Abdominal: negative for nausea, vomiting, diarrhea,  bright red blood per rectum, melena, or hematemesis Neurologic: negative for visual changes, syncope, or dizziness All other systems reviewed and are otherwise negative except as noted above.    Blood pressure 128/60, pulse 50, height 5\' 7"  (1.702 m), weight 139 lb (63.05 kg).  General appearance: alert and no distress Neck: no adenopathy, no carotid bruit, no JVD, supple, symmetrical, trachea midline and thyroid not enlarged, symmetric, no tenderness/mass/nodules Lungs: clear to auscultation bilaterally Heart: regular rate and rhythm, S1, S2 normal,  no murmur, click, rub or gallop Extremities: extremities normal, atraumatic, no cyanosis or edema  EKG sinus bradycardia at 50 with right bundle branch block and septal Q waves  ASSESSMENT AND PLAN:   CAD (coronary artery disease) Status post remote LAD stenting in 1996 [place]. I catheterized him 11/27/01 revealed a patent LAD stent with mild in-stent restenosis. His last Myoview performed 06/12/09 was nonischemic. He denies chest pain or shortness of breath.  Hyperlipidemia On statin therapy with recent liver profile performed 12/30/12 revealing toco showed 80, LDL of 37 and HDL of 26      Runell Gess MD [facility], [facility] 06/03/2013 2:42 PM

## 2013-06-07 ENCOUNTER — Ambulatory Visit: Payer: Medicare Other | Attending: Endocrinology | Admitting: Physical Therapy

## 2013-06-07 DIAGNOSIS — R262 Difficulty in walking, not elsewhere classified: Secondary | ICD-10-CM | POA: Insufficient documentation

## 2013-06-07 DIAGNOSIS — R5381 Other malaise: Secondary | ICD-10-CM | POA: Insufficient documentation

## 2013-06-07 DIAGNOSIS — IMO0001 Reserved for inherently not codable concepts without codable children: Secondary | ICD-10-CM | POA: Insufficient documentation

## 2013-06-14 ENCOUNTER — Ambulatory Visit: Payer: Medicare Other | Admitting: Physical Therapy

## 2013-06-15 ENCOUNTER — Ambulatory Visit: Payer: Self-pay | Admitting: Pharmacist Clinician (PhC)/ Clinical Pharmacy Specialist

## 2013-06-15 DIAGNOSIS — Z7901 Long term (current) use of anticoagulants: Secondary | ICD-10-CM

## 2013-06-15 DIAGNOSIS — I82403 Acute embolism and thrombosis of unspecified deep veins of lower extremity, bilateral: Secondary | ICD-10-CM

## 2013-06-16 ENCOUNTER — Ambulatory Visit: Payer: Medicare Other | Admitting: Physical Therapy

## 2013-06-23 ENCOUNTER — Ambulatory Visit: Payer: Medicare Other | Admitting: Physical Therapy

## 2013-06-24 ENCOUNTER — Emergency Department (HOSPITAL_COMMUNITY): Payer: Medicare Other

## 2013-06-24 ENCOUNTER — Emergency Department (HOSPITAL_COMMUNITY)
Admission: EM | Admit: 2013-06-24 | Discharge: 2013-06-24 | Disposition: A | Discharge status: Home / Self Care | Payer: Medicare Other | Attending: Emergency Medicine | Admitting: Emergency Medicine

## 2013-06-24 ENCOUNTER — Ambulatory Visit: Payer: Medicare Other | Admitting: Physical Therapy

## 2013-06-24 ENCOUNTER — Encounter (HOSPITAL_COMMUNITY): Payer: Self-pay

## 2013-06-24 DIAGNOSIS — M129 Arthropathy, unspecified: Secondary | ICD-10-CM | POA: Insufficient documentation

## 2013-06-24 DIAGNOSIS — I2510 Atherosclerotic heart disease of native coronary artery without angina pectoris: Secondary | ICD-10-CM | POA: Insufficient documentation

## 2013-06-24 DIAGNOSIS — I10 Essential (primary) hypertension: Secondary | ICD-10-CM | POA: Insufficient documentation

## 2013-06-24 DIAGNOSIS — Z794 Long term (current) use of insulin: Secondary | ICD-10-CM | POA: Insufficient documentation

## 2013-06-24 DIAGNOSIS — Z8551 Personal history of malignant neoplasm of bladder: Secondary | ICD-10-CM | POA: Insufficient documentation

## 2013-06-24 DIAGNOSIS — Z87448 Personal history of other diseases of urinary system: Secondary | ICD-10-CM | POA: Insufficient documentation

## 2013-06-24 DIAGNOSIS — E119 Type 2 diabetes mellitus without complications: Secondary | ICD-10-CM | POA: Insufficient documentation

## 2013-06-24 DIAGNOSIS — K219 Gastro-esophageal reflux disease without esophagitis: Secondary | ICD-10-CM | POA: Insufficient documentation

## 2013-06-24 DIAGNOSIS — M1990 Unspecified osteoarthritis, unspecified site: Secondary | ICD-10-CM | POA: Insufficient documentation

## 2013-06-24 DIAGNOSIS — Z87891 Personal history of nicotine dependence: Secondary | ICD-10-CM | POA: Insufficient documentation

## 2013-06-24 DIAGNOSIS — Z8546 Personal history of malignant neoplasm of prostate: Secondary | ICD-10-CM | POA: Insufficient documentation

## 2013-06-24 DIAGNOSIS — E785 Hyperlipidemia, unspecified: Secondary | ICD-10-CM | POA: Insufficient documentation

## 2013-06-24 DIAGNOSIS — F329 Major depressive disorder, single episode, unspecified: Secondary | ICD-10-CM | POA: Insufficient documentation

## 2013-06-24 DIAGNOSIS — Z8673 Personal history of transient ischemic attack (TIA), and cerebral infarction without residual deficits: Secondary | ICD-10-CM | POA: Insufficient documentation

## 2013-06-24 DIAGNOSIS — Z87828 Personal history of other (healed) physical injury and trauma: Secondary | ICD-10-CM | POA: Insufficient documentation

## 2013-06-24 DIAGNOSIS — Z880 Allergy status to penicillin: Secondary | ICD-10-CM | POA: Insufficient documentation

## 2013-06-24 DIAGNOSIS — R109 Unspecified abdominal pain: Secondary | ICD-10-CM | POA: Insufficient documentation

## 2013-06-24 DIAGNOSIS — M549 Dorsalgia, unspecified: Secondary | ICD-10-CM | POA: Insufficient documentation

## 2013-06-24 DIAGNOSIS — Z79899 Other long term (current) drug therapy: Secondary | ICD-10-CM | POA: Insufficient documentation

## 2013-06-24 DIAGNOSIS — M810 Age-related osteoporosis without current pathological fracture: Secondary | ICD-10-CM | POA: Insufficient documentation

## 2013-06-24 DIAGNOSIS — F3289 Other specified depressive episodes: Secondary | ICD-10-CM | POA: Insufficient documentation

## 2013-06-24 DIAGNOSIS — Z8619 Personal history of other infectious and parasitic diseases: Secondary | ICD-10-CM | POA: Insufficient documentation

## 2013-06-24 DIAGNOSIS — Z9861 Coronary angioplasty status: Secondary | ICD-10-CM | POA: Insufficient documentation

## 2013-06-24 DIAGNOSIS — IMO0002 Reserved for concepts with insufficient information to code with codable children: Secondary | ICD-10-CM | POA: Insufficient documentation

## 2013-06-24 DIAGNOSIS — Z9889 Other specified postprocedural states: Secondary | ICD-10-CM | POA: Insufficient documentation

## 2013-06-24 DIAGNOSIS — R1030 Lower abdominal pain, unspecified: Secondary | ICD-10-CM

## 2013-06-24 DIAGNOSIS — R32 Unspecified urinary incontinence: Secondary | ICD-10-CM | POA: Insufficient documentation

## 2013-06-24 LAB — CBC WITH DIFFERENTIAL/PLATELET
Basophils Relative: 1 % (ref 0–1)
Hemoglobin: 10.3 g/dL — ABNORMAL LOW (ref 13.0–17.0)
Lymphs Abs: 1.3 10*3/uL (ref 0.7–4.0)
Monocytes Relative: 14 % — ABNORMAL HIGH (ref 3–12)
Neutro Abs: 3.3 10*3/uL (ref 1.7–7.7)
Neutrophils Relative %: 55 % (ref 43–77)
RBC: 3.31 MIL/uL — ABNORMAL LOW (ref 4.22–5.81)
WBC: 6.1 10*3/uL (ref 4.0–10.5)

## 2013-06-24 LAB — POCT I-STAT, CHEM 8
Chloride: 102 mEq/L (ref 96–112)
Creatinine, Ser: 1.3 mg/dL (ref 0.50–1.35)
Glucose, Bld: 215 mg/dL — ABNORMAL HIGH (ref 70–99)
HCT: 33 % — ABNORMAL LOW (ref 39.0–52.0)
Potassium: 4.4 mEq/L (ref 3.5–5.1)

## 2013-06-24 MED ORDER — SODIUM CHLORIDE 0.9 % IV SOLN
INTRAVENOUS | Status: DC
  Administered 2013-06-24: 13:00:00 via INTRAVENOUS

## 2013-06-24 MED ORDER — TRAMADOL HCL 50 MG PO TABS: 50.0000 mg | ORAL_TABLET | Freq: Four times a day (QID) | ORAL | Status: DC | PRN

## 2013-06-24 NOTE — ED Provider Notes (Signed)
CSN: [id number]     Arrival date & time 06/24/13  1058 History   First MD Initiated Contact with Patient 06/24/13 1130     Chief Complaint  Patient presents with  . Right Groin Pain    (Consider location/radiation/quality/duration/timing/severity/associated sxs/prior Treatment) The history is provided by the patient.   patient has had right groin pain for the last few weeks. He states he has had pain since he had a urologic device removed. He also had a previous urethra injury. He has seen his urologist for this. He saw them 9 days ago. Family states they did not give him an answer and told him to followup with a different doctor. he has chronic urinary incontinence. The pain starts in his inguinal area and goes to the hip. It is worse with movement. There is no change with urination. No nausea vomiting or diarrhea. No constipation. Past Medical History  Diagnosis Date  . Hypertension   . Diabetes mellitus without complication   . Arthritis   . Sciatica   . GERD (gastroesophageal reflux disease)   . Urge incontinence   . Vertigo   . Hyperlipidemia   . ED (erectile dysfunction)   . Gastric polyposis   . Osteoporosis   . Decreased libido   . Depression   . Hepatitis A   . Stroke   . History of blood transfusion     1946  . OA (osteoarthritis)   . Bladder cancer   . Prostate cancer     S/P prostatectomy; Lung metastasis  . CAD (coronary artery disease) 1996    stent to the LAD   . Hyperlipidemia   . Type 2 diabetes mellitus    Past Surgical History  Procedure Laterality Date  . Carpal tunnel release    . Cataract extraction w/ intraocular lens  implant, bilateral  1998  . Prostatectomy    . Urinary sphincter implant    . Urinary sphincter implant revision    . Appendectomy    . Left hip surgery      Donated bone for bone graft to arm  . Penile prosthesis implant      S/P removal and re-implantation of new prosthesis  . Middle ear surgery    . Finger surgery      Left  and right  . Hernia repair    . Bladder surgery    . Right arm bone graft      Pathological fracture  . Cardiac catheterization  11/27/2001    patent stent with mild in-stent restenosis.  . Myoview  06/12/2009    Persantine myoview EF 76%; Normal myoview  . Coronary angioplasty  1996    stenting to the LAD in [place].    Family History  Problem Relation Age of Onset  . Heart failure Mother   . Bladder Cancer Father   . Pancreatic cancer Sister   . Lung cancer Sister   . Breast cancer Daughter   . Liver disease Son    History  Substance Use Topics  . Smoking status: Former Games developer  . Smokeless tobacco: Never Used  . Alcohol Use: No    Review of Systems  Constitutional: Negative for activity change and appetite change.  HENT: Negative for neck stiffness.   Eyes: Negative for pain.  Respiratory: Negative for chest tightness and shortness of breath.   Cardiovascular: Negative for chest pain and leg swelling.  Gastrointestinal: Positive for abdominal pain. Negative for nausea, vomiting and diarrhea.  Genitourinary: Negative for  flank pain.  Musculoskeletal: Positive for back pain.  Skin: Negative for rash.  Neurological: Negative for headaches.  Psychiatric/Behavioral: Negative for behavioral problems.    Allergies  Albuterol sulfate hfa; Adhesive; Albuterol; Amlodipine; Contrast media; Lactose; Metrizamide; Norvasc; Penicillins; Sulfa drugs cross reactors; and Sulfamethoxazole  Home Medications   Current Outpatient Rx  Name  Route  Sig  Dispense  Refill  . acetaminophen (TYLENOL) 325 MG tablet   Oral   Take 650 mg by mouth 3 (three) times daily as needed for pain.         [name] amLODipine (NORVASC) 10 MG tablet   Oral   Take 10 mg by mouth daily.         . bicalutamide (CASODEX) 50 MG tablet   Oral   Take 50 mg by mouth daily.         . Calcium Carbonate-Vitamin D (CALCIUM 600+D HIGH POTENCY) 600-400 MG-UNIT per tablet   Oral   Take 1 tablet by mouth 2  (two) times daily.           . carboxymethylcellulose (REFRESH PLUS) 0.5 % SOLN   Both Eyes   Place 1 drop into both eyes 2 (two) times daily as needed (for dry eyes).         [name] docusate sodium (COLACE) 100 MG capsule   Oral   Take 200 mg by mouth 2 (two) times daily as needed for constipation.         . fluticasone (FLONASE) 50 MCG/ACT nasal spray   Nasal   Place 2 sprays into the nose 2 (two) times daily.         [name] glimepiride (AMARYL) 4 MG tablet   Oral   Take 4 mg by mouth daily before breakfast.         . hydrOXYzine (ATARAX/VISTARIL) 25 MG tablet   Oral   Take 25 mg by mouth 3 (three) times daily as needed for itching.         . insulin aspart (NOVOLOG) 100 UNIT/ML injection   Subcutaneous   Inject 3 Units into the skin 3 (three) times daily before meals. 0-150=0; 151-200=2u; 201-250=3u; 251-300=4u; 301-350=5u; over 350 =6u prior to meals         . insulin glargine (LANTUS) 100 UNIT/ML injection   Subcutaneous   Inject 6 Units into the skin at bedtime.         [name] Leuprolide Acetate (LUPRON DEPOT IM)   Intramuscular   Inject 1 Syringe into the muscle every 6 (six) months.         . lipase/protease/amylase (CREON-12/PANCREASE) 12000 UNITS CPEP   Oral   Take 4 capsules by mouth 3 (three) times daily.         [name] losartan (COZAAR) 50 MG tablet   Oral   Take 50 mg by mouth daily.         . Melatonin 3 MG TABS   Oral   Take 3 mg by mouth at bedtime.         . metoprolol (LOPRESSOR) 50 MG tablet   Oral   Take 50 mg by mouth daily.         . Multiple Vitamins-Minerals (MULTIVITAMINS THER. W/MINERALS) TABS   Oral   Take 1 tablet by mouth every morning.          . nitroGLYCERIN (NITROSTAT) 0.4 MG SL tablet   Sublingual   Place 0.4 mg under the tongue every 5 (five) minutes as needed for chest  pain.          . omeprazole (PRILOSEC) 20 MG capsule   Oral   Take 20 mg by mouth 2 (two) times daily.           [name] oxybutynin (DITROPAN) 5  MG tablet   Oral   Take 5 mg by mouth at bedtime.         . sertraline (ZOLOFT) 100 MG tablet   Oral   Take 100 mg by mouth daily.         . simvastatin (ZOCOR) 40 MG tablet   Oral   Take 40 mg by mouth at bedtime.         . traMADol (ULTRAM) 50 MG tablet   Oral   Take 1 tablet (50 mg total) by mouth every 6 (six) hours as needed for pain.   10 tablet   0    BP 164/76  Pulse 72  Temp(Src) 98.6 F (37 C) (Oral)  Resp 18  SpO2 98% Physical Exam  Nursing note and vitals reviewed. Constitutional: He is oriented to person, place, and time. He appears well-developed and well-nourished.  HENT:  Head: Normocephalic and atraumatic.  Eyes: EOM are normal. Pupils are equal, round, and reactive to light.  Neck: Normal range of motion. Neck supple.  Cardiovascular: Normal rate, regular rhythm and normal heart sounds.   No murmur heard. Pulmonary/Chest: Effort normal and breath sounds normal.  Abdominal: Soft. Bowel sounds are normal. He exhibits no distension and no mass. There is tenderness. There is no rebound and no guarding.  Right inguinal tenderness. No mass palpated. Tenderness moves laterally towards the hip area.  Musculoskeletal: Normal range of motion. He exhibits no edema and no tenderness.  Neurological: He is alert and oriented to person, place, and time. No cranial nerve deficit.  Skin: Skin is warm and dry.  Psychiatric: He has a normal mood and affect.    ED Course  Procedures (including critical care time) Labs Review Labs Reviewed  CBC WITH DIFFERENTIAL - Abnormal; Notable for the following:    RBC 3.31 (*)    Hemoglobin 10.3 (*)    HCT 31.1 (*)    RDW 16.2 (*)    Platelets 129 (*)    Monocytes Relative 14 (*)    Eosinophils Relative 10 (*)    All other components within normal limits  POCT I-STAT, CHEM 8 - Abnormal; Notable for the following:    BUN 31 (*)    Glucose, Bld 215 (*)    Hemoglobin 11.2 (*)    HCT 33.0 (*)    All other components  within normal limits   Imaging Review Dg Hip Complete Right  06/24/2013   CLINICAL DATA:  Right groin pain  EXAM: RIGHT HIP - COMPLETE 2+ VIEW  COMPARISON:  None.  FINDINGS: Three views of the right hip submitted. Diffuse osteopenia. No acute fracture or subluxation. Multiple surgical clips are noted within pelvis probable post prostatectomy. Prior vertebroplasty lower lumbar spine.  IMPRESSION: No acute fracture or subluxation. Diffuse osteopenia. Postsurgical changes within pelvis. Prior vertebroplasty lower lumbar spine.   Electronically Signed   By: Natasha Mead   On: 06/24/2013 13:27   Ct Pelvis Wo Contrast  06/24/2013   CLINICAL DATA:  Right groin pain. History urologic surgery.  EXAM: CT PELVIS WITHOUT CONTRAST  TECHNIQUE: Multidetector CT imaging of the pelvis was performed following the standard protocol without intravenous contrast.  COMPARISON:  MRI pelvis 10/04/2011.  FINDINGS: In discussed that the  penile prosthesis demonstrated on the prior MRI scan has been removed. There is still some hardware related to the prosthesis including a reservoir located near the bladder. There are extensive surgical changes in the pelvis likely related to prostate cancer surgery. Prostate gland is surgically absent. The bladder is unremarkable. There is a large amount of stool in the rectum. No pelvic mass, adenopathy or free pelvic fluid collections. No inguinal mass or hernia.  The bony pelvis is intact. Moderate osteoporosis and surgical changes involving the left iliac crest. No destructive bone lesion.  IMPRESSION: Surgical changes from a prostatectomy and lymph node dissection. No pelvic mass, adenopathy or free pelvic fluid collections.  Interval removal of penile prosthesis. A reservoir is still in place near the bladder.  No inguinal mass or hernia.   Electronically Signed   By: Loralie Champagne M.D.   On: 06/24/2013 14:45    MDM   1. Groin pain    Patient with groin pain. Has had for a while since  previous surgery. CT reassuring. Has seen neurology for same. Will be discharged home    Juliet Rude. Rubin Payor, MD 06/25/13 2051

## 2013-06-24 NOTE — ED Notes (Signed)
Has an urinary implant (type unknown, was told the internal implant helped patient to urinate) removed a few months ago. Right groin pain started 2 weeks ago. Pain sharp, intermittent, relived with rest, aggravate by movement. Since removal of the implant, pt has been urinary incontinence.

## 2013-06-24 NOTE — ED Notes (Signed)
Was refer to ED by urologist for possible need for CT or MRI

## 2013-06-24 NOTE — ED Notes (Signed)
Pt scratched right upper arm creating a skin tear cleaned dried gauze and paper tape applied

## 2013-07-20 ENCOUNTER — Telehealth: Payer: Self-pay | Admitting: Cardiovascular Disease

## 2013-07-20 NOTE — Telephone Encounter (Signed)
Going to have surgery-his urologist wants to know if he can stop his aspirin for 10 days- His doctor is Dr Ilean China number is [phone]. Fax number is [phone]

## 2013-07-20 NOTE — Telephone Encounter (Signed)
Message sent to Dr. Rockie Neighbours, RN to advise

## 2013-07-21 NOTE — Telephone Encounter (Signed)
Guy [name] is calling back stating that they want to schedule her husbands surgery next Monday or Tuesday of next week. She wants a call back ASAP. I told her that Dr. Allyson Sabal has patient's today.

## 2013-07-21 NOTE — Telephone Encounter (Signed)
I left a message for patient that Dr Allyson Sabal did authorize Mr Hollibaugh to hold aspirin 10 days prior to the procedure.  Form was faxed to urologist.

## 2013-09-30 ENCOUNTER — Emergency Department (HOSPITAL_COMMUNITY)
Admission: EM | Admit: 2013-09-30 | Discharge: 2013-09-30 | Disposition: A | Discharge status: Home / Self Care | Payer: Medicare Other | Attending: Emergency Medicine | Admitting: Emergency Medicine

## 2013-09-30 ENCOUNTER — Encounter (HOSPITAL_COMMUNITY): Payer: Self-pay | Admitting: Emergency Medicine

## 2013-09-30 ENCOUNTER — Emergency Department (HOSPITAL_COMMUNITY): Payer: Medicare Other

## 2013-09-30 DIAGNOSIS — R21 Rash and other nonspecific skin eruption: Secondary | ICD-10-CM | POA: Insufficient documentation

## 2013-09-30 DIAGNOSIS — Z8619 Personal history of other infectious and parasitic diseases: Secondary | ICD-10-CM | POA: Insufficient documentation

## 2013-09-30 DIAGNOSIS — F329 Major depressive disorder, single episode, unspecified: Secondary | ICD-10-CM | POA: Insufficient documentation

## 2013-09-30 DIAGNOSIS — W19XXXA Unspecified fall, initial encounter: Secondary | ICD-10-CM

## 2013-09-30 DIAGNOSIS — Z794 Long term (current) use of insulin: Secondary | ICD-10-CM | POA: Insufficient documentation

## 2013-09-30 DIAGNOSIS — I2510 Atherosclerotic heart disease of native coronary artery without angina pectoris: Secondary | ICD-10-CM | POA: Insufficient documentation

## 2013-09-30 DIAGNOSIS — Z87448 Personal history of other diseases of urinary system: Secondary | ICD-10-CM | POA: Insufficient documentation

## 2013-09-30 DIAGNOSIS — Z880 Allergy status to penicillin: Secondary | ICD-10-CM | POA: Insufficient documentation

## 2013-09-30 DIAGNOSIS — Z8546 Personal history of malignant neoplasm of prostate: Secondary | ICD-10-CM | POA: Insufficient documentation

## 2013-09-30 DIAGNOSIS — Z8739 Personal history of other diseases of the musculoskeletal system and connective tissue: Secondary | ICD-10-CM | POA: Insufficient documentation

## 2013-09-30 DIAGNOSIS — E119 Type 2 diabetes mellitus without complications: Secondary | ICD-10-CM | POA: Insufficient documentation

## 2013-09-30 DIAGNOSIS — N390 Urinary tract infection, site not specified: Secondary | ICD-10-CM | POA: Insufficient documentation

## 2013-09-30 DIAGNOSIS — Z9861 Coronary angioplasty status: Secondary | ICD-10-CM | POA: Insufficient documentation

## 2013-09-30 DIAGNOSIS — Z87891 Personal history of nicotine dependence: Secondary | ICD-10-CM | POA: Insufficient documentation

## 2013-09-30 DIAGNOSIS — K219 Gastro-esophageal reflux disease without esophagitis: Secondary | ICD-10-CM | POA: Insufficient documentation

## 2013-09-30 DIAGNOSIS — S2239XA Fracture of one rib, unspecified side, initial encounter for closed fracture: Secondary | ICD-10-CM | POA: Insufficient documentation

## 2013-09-30 DIAGNOSIS — I10 Essential (primary) hypertension: Secondary | ICD-10-CM | POA: Insufficient documentation

## 2013-09-30 DIAGNOSIS — Y9389 Activity, other specified: Secondary | ICD-10-CM | POA: Insufficient documentation

## 2013-09-30 DIAGNOSIS — S2231XA Fracture of one rib, right side, initial encounter for closed fracture: Secondary | ICD-10-CM

## 2013-09-30 DIAGNOSIS — Z79899 Other long term (current) drug therapy: Secondary | ICD-10-CM | POA: Insufficient documentation

## 2013-09-30 DIAGNOSIS — E785 Hyperlipidemia, unspecified: Secondary | ICD-10-CM | POA: Insufficient documentation

## 2013-09-30 DIAGNOSIS — F3289 Other specified depressive episodes: Secondary | ICD-10-CM | POA: Insufficient documentation

## 2013-09-30 DIAGNOSIS — IMO0002 Reserved for concepts with insufficient information to code with codable children: Secondary | ICD-10-CM | POA: Insufficient documentation

## 2013-09-30 DIAGNOSIS — Z8551 Personal history of malignant neoplasm of bladder: Secondary | ICD-10-CM | POA: Insufficient documentation

## 2013-09-30 DIAGNOSIS — Z8673 Personal history of transient ischemic attack (TIA), and cerebral infarction without residual deficits: Secondary | ICD-10-CM | POA: Insufficient documentation

## 2013-09-30 DIAGNOSIS — Z95818 Presence of other cardiac implants and grafts: Secondary | ICD-10-CM | POA: Insufficient documentation

## 2013-09-30 DIAGNOSIS — Y9289 Other specified places as the place of occurrence of the external cause: Secondary | ICD-10-CM | POA: Insufficient documentation

## 2013-09-30 DIAGNOSIS — R296 Repeated falls: Secondary | ICD-10-CM | POA: Insufficient documentation

## 2013-09-30 LAB — BASIC METABOLIC PANEL
BUN: 31 mg/dL — ABNORMAL HIGH (ref 6–23)
CHLORIDE: 97 meq/L (ref 96–112)
CO2: 27 meq/L (ref 19–32)
Calcium: 9.7 mg/dL (ref 8.4–10.5)
Creatinine, Ser: 1.43 mg/dL — ABNORMAL HIGH (ref 0.50–1.35)
GFR calc Af Amer: 48 mL/min — ABNORMAL LOW (ref >90)
GFR calc non Af Amer: 41 mL/min — ABNORMAL LOW (ref >90)
Glucose, Bld: 65 mg/dL — ABNORMAL LOW (ref 70–99)
POTASSIUM: 4 meq/L (ref 3.7–5.3)
Sodium: 137 mEq/L (ref 137–147)

## 2013-09-30 LAB — URINALYSIS, ROUTINE W REFLEX MICROSCOPIC
Bilirubin Urine: NEGATIVE
Glucose, UA: NEGATIVE mg/dL
Ketones, ur: NEGATIVE mg/dL
Nitrite: POSITIVE — AB
PH: 6 (ref 5.0–8.0)
Protein, ur: 100 mg/dL — AB
Specific Gravity, Urine: 1.019 (ref 1.005–1.030)
Urobilinogen, UA: 1 mg/dL (ref 0.0–1.0)

## 2013-09-30 LAB — CBC WITH DIFFERENTIAL/PLATELET
Basophils Absolute: 0.1 K/uL (ref 0.0–0.1)
Basophils Relative: 1 % (ref 0–1)
Eosinophils Absolute: 0.8 K/uL — ABNORMAL HIGH (ref 0.0–0.7)
Eosinophils Relative: 7 % — ABNORMAL HIGH (ref 0–5)
HCT: 40.2 % (ref 39.0–52.0)
Hemoglobin: 13.9 g/dL (ref 13.0–17.0)
Lymphocytes Relative: 19 % (ref 12–46)
Lymphs Abs: 2.1 K/uL (ref 0.7–4.0)
MCH: 33.1 pg (ref 26.0–34.0)
MCHC: 34.6 g/dL (ref 30.0–36.0)
MCV: 95.7 fL (ref 78.0–100.0)
Monocytes Absolute: 1.2 K/uL — ABNORMAL HIGH (ref 0.1–1.0)
Monocytes Relative: 11 % (ref 3–12)
Neutro Abs: 7 K/uL (ref 1.7–7.7)
Neutrophils Relative %: 63 % (ref 43–77)
Platelets: 185 K/uL (ref 150–400)
RBC: 4.2 MIL/uL — ABNORMAL LOW (ref 4.22–5.81)
RDW: 14 % (ref 11.5–15.5)
WBC: 11.1 K/uL — ABNORMAL HIGH (ref 4.0–10.5)

## 2013-09-30 LAB — URINE MICROSCOPIC-ADD ON

## 2013-09-30 MED ORDER — CEPHALEXIN 500 MG PO CAPS
500.0000 mg | ORAL_CAPSULE | Freq: Once | ORAL | Status: AC
  Administered 2013-09-30: 500 mg via ORAL
  Filled 2013-09-30: qty 1

## 2013-09-30 MED ORDER — CEPHALEXIN 500 MG PO CAPS: 500.0000 mg | ORAL_CAPSULE | Freq: Four times a day (QID) | ORAL | Status: DC

## 2013-09-30 NOTE — ED Notes (Signed)
Pt fell while in kitchen this am lost is blance no loc, denies hitting his head. Pt has a hx of back fusion and has lower back pain. Pt did walk in. Alert x4,

## 2013-09-30 NOTE — Discharge Instructions (Signed)
Rib Fracture A rib fracture is a break or crack in one of the bones of the ribs. The ribs are a group of long, curved bones that wrap around your chest and attach to your spine. They protect your lungs and other organs in the chest cavity. A broken or cracked rib is often painful, but most do not cause other problems. Most rib fractures heal on their own over time. However, rib fractures can be more serious if multiple ribs are broken or if broken ribs move out of place and push against other structures. CAUSES   A direct blow to the chest. For example, this could happen during contact sports, a car accident, or a fall against a hard object.  Repetitive movements with high force, such as pitching a baseball or having severe coughing spells. SYMPTOMS   Pain when you breathe in or cough.  Pain when someone presses on the injured area. DIAGNOSIS  Your caregiver will perform a physical exam. Various imaging tests may be ordered to confirm the diagnosis and to look for related injuries. These tests may include a chest X-ray, computed tomography (CT), magnetic resonance imaging (MRI), or a bone scan. TREATMENT  Rib fractures usually heal on their own in 1 3 months. The longer healing period is often associated with a continued cough or other aggravating activities. During the healing period, pain control is very important. Medication is usually given to control pain. Hospitalization or surgery may be needed for more severe injuries, such as those in which multiple ribs are broken or the ribs have moved out of place.  HOME CARE INSTRUCTIONS   Avoid strenuous activity and any activities or movements that cause pain. Be careful during activities and avoid bumping the injured rib.  Gradually increase activity as directed by your caregiver.  Only take over-the-counter or prescription medications as directed by your caregiver. Do not take other medications without asking your caregiver first.  Apply ice  to the injured area for the first 1 2 days after you have been treated or as directed by your caregiver. Applying ice helps to reduce inflammation and pain.  Put ice in a plastic bag.  Place a towel between your skin and the bag.   Leave the ice on for 15 20 minutes at a time, every 2 hours while you are awake.  Perform deep breathing as directed by your caregiver. This will help prevent pneumonia, which is a common complication of a broken rib. Your caregiver may instruct you to:  Take deep breaths several times a day.  Try to cough several times a day, holding a pillow against the injured area.  Use a device called an incentive spirometer to practice deep breathing several times a day.  Drink enough fluids to keep your urine clear or pale yellow. This will help you avoid constipation.   Do not wear a rib belt or binder. These restrict breathing, which can lead to pneumonia.  SEEK IMMEDIATE MEDICAL CARE IF:   You have a fever.   You have difficulty breathing or shortness of breath.   You develop a continual cough, or you cough up thick or bloody sputum.  You feel sick to your stomach (nausea), throw up (vomit), or have abdominal pain.   You have worsening pain not controlled with medications.  MAKE SURE YOU:  Understand these instructions.  Will watch your condition.  Will get help right away if you are not doing well or get worse. Document Released: 09/16/2005 Document Revised:  05/19/2013 Document Reviewed: 11/18/2012 [employer] Patient Information 2014 [place], [place].  Urinary Tract Infection Urinary tract infections (UTIs) can develop anywhere along your urinary tract. Your urinary tract is your body's drainage system for removing wastes and extra water. Your urinary tract includes two kidneys, two ureters, a bladder, and a urethra. Your kidneys are a pair of bean-shaped organs. Each kidney is about the size of your fist. They are located below your ribs, one on  each side of your spine. CAUSES Infections are caused by microbes, which are microscopic organisms, including fungi, viruses, and bacteria. These organisms are so small that they can only be seen through a microscope. Bacteria are the microbes that most commonly cause UTIs. SYMPTOMS  Symptoms of UTIs may vary by age and gender of the patient and by the location of the infection. Symptoms in young women typically include a frequent and intense urge to urinate and a painful, burning feeling in the bladder or urethra during urination. Older women and men are more likely to be tired, shaky, and weak and have muscle aches and abdominal pain. A fever may mean the infection is in your kidneys. Other symptoms of a kidney infection include pain in your back or sides below the ribs, nausea, and vomiting. DIAGNOSIS To diagnose a UTI, your caregiver will ask you about your symptoms. Your caregiver also will ask to provide a urine sample. The urine sample will be tested for bacteria and white blood cells. White blood cells are made by your body to help fight infection. TREATMENT  Typically, UTIs can be treated with medication. Because most UTIs are caused by a bacterial infection, they usually can be treated with the use of antibiotics. The choice of antibiotic and length of treatment depend on your symptoms and the type of bacteria causing your infection. HOME CARE INSTRUCTIONS  If you were prescribed antibiotics, take them exactly as your caregiver instructs you. Finish the medication even if you feel better after you have only taken some of the medication.  Drink enough water and fluids to keep your urine clear or pale yellow.  Avoid caffeine, tea, and carbonated beverages. They tend to irritate your bladder.  Empty your bladder often. Avoid holding urine for long periods of time.  Empty your bladder before and after sexual intercourse.  After a bowel movement, women should cleanse from front to back. Use  each tissue only once. SEEK MEDICAL CARE IF:   You have back pain.  You develop a fever.  Your symptoms do not begin to resolve within 3 days. SEEK IMMEDIATE MEDICAL CARE IF:   You have severe back pain or lower abdominal pain.  You develop chills.  You have nausea or vomiting.  You have continued burning or discomfort with urination. MAKE SURE YOU:   Understand these instructions.  Will watch your condition.  Will get help right away if you are not doing well or get worse. Document Released: 06/26/2005 Document Revised: 03/17/2012 Document Reviewed: 10/25/2011 [facility] Patient Information 2014 [place].  Drug Rash Skin reactions can be caused by several different drugs. Allergy to the medicine can cause itching, hives, and other rashes. Sun exposure causes a red rash with some medicines. Mononucleosis virus can cause a similar red rash when you are taking antibiotics. Sometimes, the rash may be accompanied by pain. The drug rash may happen with new drugs or with medicines that you have been taking for a while. The rash cannot be spread from person to person. In most cases,  the symptoms of a drug rash are gone within a few days of stopping the medicine. Your rash, including hives (urticaria), is most likely from the following medicines:  Antibiotics or antimicrobials.  Anticonvulsants or seizure medicines.  Antihypertensives or blood pressure medicines.  Antimalarials.  Antidepressants or depression medicines.  Antianxiety drugs.  Diuretics or water pills.  Nonsteroidal anti-inflammatory drugs.  Simvastatin.  Lithium.  Omeprazole.  Allopurinol.  Pseudoephedrine.  Amiodarone.  Packed red blood cells, when you get a blood transfusion.  Contrast media, such as when getting an imaging test (CT or CAT scan). This drug list is not all inclusive, but drug rashes have been reported with all the medicines listed above.Your caregiver will tell you which  medicines to avoid. If you react to a medicine, a similar or worse reaction can occur the next time you take it. If you need to stop taking an antibiotic because of a drug rash, an alternative antibiotic may be needed to get rid of your infection. Antihistamine or cortisone drugs may be prescribed to help relieve your symptoms. Stay out of the sun until the rash is completely gone.  Be sure to let your caregiver know about your drug reaction. Do not take this medicine in the future. Call your caregiver if your drug rash does not improve within 3 to 4 days. SEEK IMMEDIATE MEDICAL CARE IF:   You develop breathing problems, swelling in the throat, or wheezing.  You have weakness, fainting, fever, and muscle or joint pains.  You develop blisters or peeling of skin, especially around the mouth. Document Released: 10/24/2004 Document Revised: 12/09/2011 Document Reviewed: 08/04/2008 [facility] Patient Information 2014 [place].

## 2013-09-30 NOTE — ED Provider Notes (Signed)
CSN: [id number]     Arrival date & time 09/30/13  1429 History   First MD Initiated Contact with Patient 09/30/13 1536     Chief Complaint  Patient presents with  . Back Pain   (Consider location/radiation/quality/duration/timing/severity/associated sxs/prior Treatment) HPI Comments: CHAMPION CORALES is a 80 y.o. male who is here for evaluation after a fall several days ago; while trying to help his wife clear a garbage disposal of a jam, when he fell. He was able to get up and ambulate afterward. He presents now for evaluation of low back pain, sustained after the fall. He has been using his usual medications, without relief. He recently had a rash, whole body, as a reaction to an unknown medication. He has are seen a dermatologist about it. He has been eating well and ambulating normally, since the fall. There are no other known modifying factors.   Patient is a 80 y.o. male presenting with back pain. The history is provided by the patient.  Back Pain   Past Medical History  Diagnosis Date  . Hypertension   . Diabetes mellitus without complication   . Arthritis   . Sciatica   . GERD (gastroesophageal reflux disease)   . Urge incontinence   . Vertigo   . Hyperlipidemia   . ED (erectile dysfunction)   . Gastric polyposis   . Osteoporosis   . Decreased libido   . Depression   . Hepatitis A   . Stroke   . History of blood transfusion     1946  . OA (osteoarthritis)   . Bladder cancer   . Prostate cancer     S/P prostatectomy; Lung metastasis  . CAD (coronary artery disease) 1996    stent to the LAD   . Hyperlipidemia   . Type 2 diabetes mellitus    Past Surgical History  Procedure Laterality Date  . Carpal tunnel release    . Cataract extraction w/ intraocular lens  implant, bilateral  1998  . Prostatectomy    . Urinary sphincter implant    . Urinary sphincter implant revision    . Appendectomy    . Left hip surgery      Donated bone for bone graft to arm  . Penile  prosthesis implant      S/P removal and re-implantation of new prosthesis  . Middle ear surgery    . Finger surgery      Left and right  . Hernia repair    . Bladder surgery    . Right arm bone graft      Pathological fracture  . Cardiac catheterization  11/27/2001    patent stent with mild in-stent restenosis.  . Myoview  06/12/2009    Persantine myoview EF 76%; Normal myoview  . Coronary angioplasty  1996    stenting to the LAD in [place].    Family History  Problem Relation Age of Onset  . Heart failure Mother   . Bladder Cancer Father   . Pancreatic cancer Sister   . Lung cancer Sister   . Breast cancer Daughter   . Liver disease Son    History  Substance Use Topics  . Smoking status: Former Research scientist (life sciences)  . Smokeless tobacco: Never Used  . Alcohol Use: No    Review of Systems  Musculoskeletal: Positive for back pain.  All other systems reviewed and are negative.    Allergies  Albuterol sulfate hfa; Adhesive; Albuterol; Amlodipine; Contrast media; Lactose; Metrizamide; Norvasc; Penicillins; Sulfa drugs  cross reactors; and Sulfamethoxazole  Home Medications   Current Outpatient Rx  Name  Route  Sig  Dispense  Refill  . acetaminophen (TYLENOL) 325 MG tablet   Oral   Take 650 mg by mouth 3 (three) times daily as needed for mild pain or moderate pain.          [name] amLODipine (NORVASC) 10 MG tablet   Oral   Take 10 mg by mouth daily.         . bicalutamide (CASODEX) 50 MG tablet   Oral   Take 50 mg by mouth daily.         . Calcium Carbonate-Vitamin D (CALCIUM 600+D HIGH POTENCY) 600-400 MG-UNIT per tablet   Oral   Take 1 tablet by mouth 2 (two) times daily.           . carboxymethylcellulose (REFRESH PLUS) 0.5 % SOLN   Both Eyes   Place 1 drop into both eyes 2 (two) times daily as needed (for dry eyes).         [name] docusate sodium (COLACE) 100 MG capsule   Oral   Take 200 mg by mouth daily.          . fluticasone (FLONASE) 50 MCG/ACT nasal  spray   Nasal   Place 2 sprays into the nose 2 (two) times daily.         [name] glimepiride (AMARYL) 4 MG tablet   Oral   Take 4 mg by mouth daily before breakfast.         . hydrOXYzine (ATARAX/VISTARIL) 25 MG tablet   Oral   Take 25 mg by mouth 2 (two) times daily.          . insulin aspart (NOVOLOG) 100 UNIT/ML injection   Subcutaneous   Inject 0-6 Units into the skin 3 (three) times daily before meals. 0-150=0; 151-200=2u; 201-250=3u; 251-300=4u; 301-350=5u; over 350 =6u prior to meals         . insulin glargine (LANTUS) 100 UNIT/ML injection   Subcutaneous   Inject 16 Units into the skin at bedtime.          [name] Leuprolide Acetate (LUPRON DEPOT IM)   Intramuscular   Inject 1 Syringe into the muscle every 6 (six) months.         . lipase/protease/amylase (CREON-12/PANCREASE) 12000 UNITS CPEP   Oral   Take 4 capsules by mouth 3 (three) times daily.         . Melatonin 3 MG TABS   Oral   Take 3 mg by mouth at bedtime.         . metoprolol (LOPRESSOR) 50 MG tablet   Oral   Take 50 mg by mouth daily.         . Multiple Vitamins-Minerals (MULTIVITAMINS THER. W/MINERALS) TABS   Oral   Take 1 tablet by mouth every morning.          [name] omeprazole (PRILOSEC) 20 MG capsule   Oral   Take 20 mg by mouth 2 (two) times daily.           [name] oxybutynin (DITROPAN) 5 MG tablet   Oral   Take 5 mg by mouth at bedtime.         . sertraline (ZOLOFT) 100 MG tablet   Oral   Take 100 mg by mouth daily.         . simvastatin (ZOCOR) 40 MG tablet   Oral   Take 40 mg by mouth  at bedtime.         . traMADol (ULTRAM) 50 MG tablet   Oral   Take 50 mg by mouth every 6 (six) hours as needed for moderate pain or severe pain.         . cephALEXin (KEFLEX) 500 MG capsule   Oral   Take 1 capsule (500 mg total) by mouth 4 (four) times daily.   28 capsule   0   . nitroGLYCERIN (NITROSTAT) 0.4 MG SL tablet   Sublingual   Place 0.4 mg under the tongue every 5  (five) minutes as needed for chest pain.           BP 165/85  Pulse 81  Temp(Src) 98.1 F (36.7 C) (Oral)  SpO2 95% Physical Exam  Nursing note and vitals reviewed. Constitutional: He is oriented to person, place, and time. He appears well-developed.  Elderly, frail  HENT:  Head: Normocephalic and atraumatic.  Right Ear: External ear normal.  Left Ear: External ear normal.  Eyes: Conjunctivae and EOM are normal. Pupils are equal, round, and reactive to light.  Neck: Normal range of motion and phonation normal. Neck supple.  Cardiovascular: Normal rate, regular rhythm, normal heart sounds and intact distal pulses.   Pulmonary/Chest: Effort normal and breath sounds normal. He exhibits no bony tenderness.  Abdominal: Soft. Normal appearance. There is no tenderness.  Musculoskeletal: Normal range of motion.  Is very kyphotic. There is no significant tenderness to palpation of the cervical, thoracic or lumbar spine. Is no significant paraspinal muscular tenderness. He has moderate tenderness of the right posterior chest wall without crepitation or palpable deformity  Neurological: He is alert and oriented to person, place, and time. No cranial nerve deficit or sensory deficit. He exhibits normal muscle tone. Coordination normal.  Skin: Skin is warm, dry and intact.  Diffuse red, raised, and irregular, rash. The buttocks have a similar rash, however, it is more confluent towards the medial buttocks with some areas of excoriation and mild evening. There is no perianal component to the rash.  Psychiatric: He has a normal mood and affect. His behavior is normal.    ED Course  Procedures (including critical care time)  Medications  cephALEXin (KEFLEX) capsule 500 mg (500 mg Oral Given 09/30/13 1856)     Patient Vitals for the past 24 hrs:  BP Temp Temp src Pulse SpO2  09/30/13 1925 165/85 mmHg 98.1 F (36.7 C) Oral 81 95 %  09/30/13 1853 177/92 mmHg 98.3 F (36.8 C) Oral 80 97 %        Labs Review Labs Reviewed  CBC WITH DIFFERENTIAL - Abnormal; Notable for the following:    WBC 11.1 (*)    RBC 4.20 (*)    Monocytes Absolute 1.2 (*)    Eosinophils Relative 7 (*)    Eosinophils Absolute 0.8 (*)    All other components within normal limits  BASIC METABOLIC PANEL - Abnormal; Notable for the following:    Glucose, Bld 65 (*)    BUN 31 (*)    Creatinine, Ser 1.43 (*)    GFR calc non Af Amer 41 (*)    GFR calc Af Amer 48 (*)    All other components within normal limits  URINALYSIS, ROUTINE W REFLEX MICROSCOPIC - Abnormal; Notable for the following:    APPearance CLOUDY (*)    Hgb urine dipstick MODERATE (*)    Protein, ur 100 (*)    Nitrite POSITIVE (*)    Leukocytes, UA LARGE (*)  All other components within normal limits  URINE MICROSCOPIC-ADD ON - Abnormal; Notable for the following:    Bacteria, UA MANY (*)    All other components within normal limits  URINE CULTURE   Imaging Review Dg Chest 2 View  09/30/2013   CLINICAL DATA:  Chest and back pain.  Hypertension and diabetes.  EXAM: CHEST  2 VIEW  COMPARISON:  03/20/2013  FINDINGS: Heart size is normal. No evidence pulmonary airspace disease or edema. No evidence of pleural effusion.  A irregular nodular density is now seen in the left upper lobe, and a small bronchogenic carcinoma cannot be excluded.  Old right humeral fracture deformity again noted.  IMPRESSION: Left upper lobe nodular density. Recommend chest CT without contrast to exclude bronchogenic carcinoma.   Electronically Signed   By: Earle Gell M.D.   On: 09/30/2013 16:54   Dg Ribs Unilateral Right  09/30/2013   CLINICAL DATA:  Recent fall with right rib pain.  EXAM: RIGHT RIBS - 2 VIEW  COMPARISON:  Chest radiograph 09/30/2013 and chest radiograph 03/20/2013  FINDINGS: Chronic cortical irregularity and thickening in the proximal right humerus. Negative for a right pneumothorax. There are old fractures in the right lower chest that appear to  be involving the right 8th and 9th ribs. Difficult to exclude a new fracture involving the right 7th rib.  IMPRESSION: Old right rib fractures and cannot exclude a new fracture in the region of the right 7th rib.   Electronically Signed   By: Markus Daft M.D.   On: 09/30/2013 16:58   Dg Lumbar Spine Complete  09/30/2013   CLINICAL DATA:  Fall and back pain.  EXAM: LUMBAR SPINE - COMPLETE 4+ VIEW  COMPARISON:  MRI 05/01/2013 and 11/18/2012  FINDINGS: Rightward scoliosis of the lumbar spine. Known compression fracture at L3 and the patient has undergone vertebral augmentation procedure at this level. There is not clear evidence for a new compression fracture. Difficult to evaluate the L4 vertebral body. Diffuse osteopenia of the bones. Multiple surgical clips in the pelvis.  IMPRESSION: Scoliosis and old compression fracture at L3. No clear evidence for an acute fracture. If there is concern for an occult compression fracture, consider further evaluation with MRI or CT.   Electronically Signed   By: Markus Daft M.D.   On: 09/30/2013 16:51    EKG Interpretation   None       MDM   1. Fall, initial encounter   2. Rib fracture, right, closed, initial encounter   3. UTI (urinary tract infection)   4. Rash     Fall with rib fracture, likely related to urinary tract infection. Incidental drug rash is noted, which is causing some mild bleeding of his buttocks. No evidence for sepsis, at about instability, or series bacterial infection.   The patient appears reasonably screened and/or stabilized for discharge and I doubt any other medical condition or other [facility] requiring further screening, evaluation, or treatment in the ED at this time prior to discharge.  Nursing Notes Reviewed/ Care Coordinated, and agree without changes. Applicable Imaging Reviewed.  Interpretation of Laboratory Data incorporated into ED treatment   Plan: Home Medications- Keflex; Home Treatments and Observation- rest, fluids,  wound care for rash; return here if the recommended treatment, does not improve the symptoms; Recommended follow up- PCP, for check up in 1 week      Richarda Blade, MD 09/30/13 2358

## 2013-10-04 LAB — URINE CULTURE: Colony Count: >=100000

## 2013-10-05 ENCOUNTER — Ambulatory Visit: Payer: Medicare Other | Attending: Surgery | Admitting: Physical Therapy

## 2013-10-05 DIAGNOSIS — C3490 Malignant neoplasm of unspecified part of unspecified bronchus or lung: Secondary | ICD-10-CM | POA: Insufficient documentation

## 2013-10-05 DIAGNOSIS — R5381 Other malaise: Secondary | ICD-10-CM | POA: Insufficient documentation

## 2013-10-05 DIAGNOSIS — E119 Type 2 diabetes mellitus without complications: Secondary | ICD-10-CM | POA: Insufficient documentation

## 2013-10-05 DIAGNOSIS — R262 Difficulty in walking, not elsewhere classified: Secondary | ICD-10-CM | POA: Insufficient documentation

## 2013-10-05 DIAGNOSIS — I10 Essential (primary) hypertension: Secondary | ICD-10-CM | POA: Insufficient documentation

## 2013-10-05 DIAGNOSIS — IMO0001 Reserved for inherently not codable concepts without codable children: Secondary | ICD-10-CM | POA: Insufficient documentation

## 2013-10-05 DIAGNOSIS — M6281 Muscle weakness (generalized): Secondary | ICD-10-CM | POA: Insufficient documentation

## 2013-10-05 NOTE — ED Notes (Signed)
Post ED Visit - Positive Culture Follow-up: Successful Patient Follow-Up  Culture assessed and recommendations reviewed by: []  Wes Maxbass, Pharm.D., BCPS []  Heide Guile, Pharm.D., BCPS []  Alycia Rossetti, Pharm.D., BCPS []  Ramey, Pharm.D., BCPS, AAHIVP []  Legrand Como, Pharm.D., BCPS, AAHIVP  Positive urine culture  [X]  Treated with cephalexin, organism resistant to prescribed antimicrobial  [ ]  Patient discharged originally without antimicrobial agent and treatment is now indicated  New antibiotic prescription: ciprofloxacin 250mg  po BID x 7 days  ED Provider: Hyman Bible, PA-C   Varney Baas 10/05/2013, 2:33 PM

## 2013-10-05 NOTE — Progress Notes (Signed)
ED Antimicrobial Stewardship Positive Culture Follow Up   Guy [name] is an 80 y.o. male who presented to [facility] on 09/30/2013 with a chief complaint of  Chief Complaint  Patient presents with  . Back Pain    Recent Results (from the past 720 hour(s))  URINE CULTURE     Status: None   Collection Time    09/30/13  4:42 PM      Result Value Range Status   Specimen Description URINE, CATHETERIZED   Final   Special Requests NONE   Final   Culture  Setup Time     Final   Value: 10/01/2013 01:25     Performed at [place]     Final   Value: >=100,000 COLONIES/ML     Performed at [facility]   Culture     Final   Value: [place]     Performed at [facility]   Report Status 10/04/2013 FINAL   Final   Organism ID, Bacteria KLEBSIELLA PNEUMONIAE   Final   Organism ID, Bacteria ESCHERICHIA COLI   Final    [x]  Treated with cephalexin, organism resistant to prescribed antimicrobial []  Patient discharged originally without antimicrobial agent and treatment is now indicated  New antibiotic prescription: ciprofloxacin 250mg  po BID x 7 days  ED Provider: Hyman Bible, PA-C   [name] 10/05/2013, 9:32 AM Infectious Diseases Pharmacist Phone# [phone]

## 2013-10-07 ENCOUNTER — Inpatient Hospital Stay (HOSPITAL_COMMUNITY)
Admission: EM | Admit: 2013-10-07 | Discharge: 2013-10-12 | DRG: 478 | Disposition: A | Discharge status: Skilled Nursing Facility | Payer: Medicare Other | Attending: Internal Medicine | Admitting: Internal Medicine

## 2013-10-07 ENCOUNTER — Encounter (HOSPITAL_COMMUNITY): Payer: Self-pay | Admitting: Emergency Medicine

## 2013-10-07 ENCOUNTER — Inpatient Hospital Stay (HOSPITAL_COMMUNITY): Payer: Medicare Other

## 2013-10-07 ENCOUNTER — Emergency Department (HOSPITAL_COMMUNITY): Payer: Medicare Other

## 2013-10-07 DIAGNOSIS — R296 Repeated falls: Secondary | ICD-10-CM | POA: Diagnosis present

## 2013-10-07 DIAGNOSIS — E785 Hyperlipidemia, unspecified: Secondary | ICD-10-CM

## 2013-10-07 DIAGNOSIS — S2249XA Multiple fractures of ribs, unspecified side, initial encounter for closed fracture: Secondary | ICD-10-CM

## 2013-10-07 DIAGNOSIS — G4700 Insomnia, unspecified: Secondary | ICD-10-CM

## 2013-10-07 DIAGNOSIS — IMO0002 Reserved for concepts with insufficient information to code with codable children: Secondary | ICD-10-CM

## 2013-10-07 DIAGNOSIS — F32A Depression, unspecified: Secondary | ICD-10-CM

## 2013-10-07 DIAGNOSIS — A419 Sepsis, unspecified organism: Secondary | ICD-10-CM

## 2013-10-07 DIAGNOSIS — Z794 Long term (current) use of insulin: Secondary | ICD-10-CM

## 2013-10-07 DIAGNOSIS — E860 Dehydration: Secondary | ICD-10-CM

## 2013-10-07 DIAGNOSIS — E119 Type 2 diabetes mellitus without complications: Secondary | ICD-10-CM | POA: Diagnosis present

## 2013-10-07 DIAGNOSIS — IMO0001 Reserved for inherently not codable concepts without codable children: Secondary | ICD-10-CM

## 2013-10-07 DIAGNOSIS — R634 Abnormal weight loss: Secondary | ICD-10-CM

## 2013-10-07 DIAGNOSIS — J9600 Acute respiratory failure, unspecified whether with hypoxia or hypercapnia: Secondary | ICD-10-CM

## 2013-10-07 DIAGNOSIS — M129 Arthropathy, unspecified: Secondary | ICD-10-CM | POA: Diagnosis present

## 2013-10-07 DIAGNOSIS — S32010A Wedge compression fracture of first lumbar vertebra, initial encounter for closed fracture: Secondary | ICD-10-CM | POA: Diagnosis present

## 2013-10-07 DIAGNOSIS — D638 Anemia in other chronic diseases classified elsewhere: Secondary | ICD-10-CM

## 2013-10-07 DIAGNOSIS — K5900 Constipation, unspecified: Secondary | ICD-10-CM

## 2013-10-07 DIAGNOSIS — N179 Acute kidney failure, unspecified: Secondary | ICD-10-CM | POA: Diagnosis not present

## 2013-10-07 DIAGNOSIS — R52 Pain, unspecified: Secondary | ICD-10-CM

## 2013-10-07 DIAGNOSIS — Z87311 Personal history of (healed) other pathological fracture: Secondary | ICD-10-CM

## 2013-10-07 DIAGNOSIS — R6521 Severe sepsis with septic shock: Secondary | ICD-10-CM

## 2013-10-07 DIAGNOSIS — Z8052 Family history of malignant neoplasm of bladder: Secondary | ICD-10-CM

## 2013-10-07 DIAGNOSIS — G929 Unspecified toxic encephalopathy: Secondary | ICD-10-CM

## 2013-10-07 DIAGNOSIS — Z9109 Other allergy status, other than to drugs and biological substances: Secondary | ICD-10-CM

## 2013-10-07 DIAGNOSIS — R609 Edema, unspecified: Secondary | ICD-10-CM

## 2013-10-07 DIAGNOSIS — D72829 Elevated white blood cell count, unspecified: Secondary | ICD-10-CM

## 2013-10-07 DIAGNOSIS — J690 Pneumonitis due to inhalation of food and vomit: Secondary | ICD-10-CM

## 2013-10-07 DIAGNOSIS — S32009A Unspecified fracture of unspecified lumbar vertebra, initial encounter for closed fracture: Principal | ICD-10-CM | POA: Diagnosis present

## 2013-10-07 DIAGNOSIS — G9340 Encephalopathy, unspecified: Secondary | ICD-10-CM

## 2013-10-07 DIAGNOSIS — I2510 Atherosclerotic heart disease of native coronary artery without angina pectoris: Secondary | ICD-10-CM

## 2013-10-07 DIAGNOSIS — F329 Major depressive disorder, single episode, unspecified: Secondary | ICD-10-CM | POA: Diagnosis present

## 2013-10-07 DIAGNOSIS — Z7901 Long term (current) use of anticoagulants: Secondary | ICD-10-CM

## 2013-10-07 DIAGNOSIS — E1165 Type 2 diabetes mellitus with hyperglycemia: Secondary | ICD-10-CM

## 2013-10-07 DIAGNOSIS — E871 Hypo-osmolality and hyponatremia: Secondary | ICD-10-CM

## 2013-10-07 DIAGNOSIS — C61 Malignant neoplasm of prostate: Secondary | ICD-10-CM

## 2013-10-07 DIAGNOSIS — Z9181 History of falling: Secondary | ICD-10-CM

## 2013-10-07 DIAGNOSIS — G92 Toxic encephalopathy: Secondary | ICD-10-CM

## 2013-10-07 DIAGNOSIS — N390 Urinary tract infection, site not specified: Secondary | ICD-10-CM | POA: Diagnosis present

## 2013-10-07 DIAGNOSIS — Z91041 Radiographic dye allergy status: Secondary | ICD-10-CM

## 2013-10-07 DIAGNOSIS — M25519 Pain in unspecified shoulder: Secondary | ICD-10-CM

## 2013-10-07 DIAGNOSIS — R32 Unspecified urinary incontinence: Secondary | ICD-10-CM | POA: Diagnosis present

## 2013-10-07 DIAGNOSIS — S32000A Wedge compression fracture of unspecified lumbar vertebra, initial encounter for closed fracture: Secondary | ICD-10-CM

## 2013-10-07 DIAGNOSIS — Z888 Allergy status to other drugs, medicaments and biological substances status: Secondary | ICD-10-CM

## 2013-10-07 DIAGNOSIS — R0602 Shortness of breath: Secondary | ICD-10-CM

## 2013-10-07 DIAGNOSIS — Z800 Family history of malignant neoplasm of digestive organs: Secondary | ICD-10-CM

## 2013-10-07 DIAGNOSIS — K219 Gastro-esophageal reflux disease without esophagitis: Secondary | ICD-10-CM | POA: Diagnosis present

## 2013-10-07 DIAGNOSIS — I824Y9 Acute embolism and thrombosis of unspecified deep veins of unspecified proximal lower extremity: Secondary | ICD-10-CM

## 2013-10-07 DIAGNOSIS — R188 Other ascites: Secondary | ICD-10-CM

## 2013-10-07 DIAGNOSIS — I82403 Acute embolism and thrombosis of unspecified deep veins of lower extremity, bilateral: Secondary | ICD-10-CM

## 2013-10-07 DIAGNOSIS — R0600 Dyspnea, unspecified: Secondary | ICD-10-CM

## 2013-10-07 DIAGNOSIS — Z880 Allergy status to penicillin: Secondary | ICD-10-CM

## 2013-10-07 DIAGNOSIS — R2681 Unsteadiness on feet: Secondary | ICD-10-CM

## 2013-10-07 DIAGNOSIS — I10 Essential (primary) hypertension: Secondary | ICD-10-CM | POA: Diagnosis present

## 2013-10-07 DIAGNOSIS — D649 Anemia, unspecified: Secondary | ICD-10-CM

## 2013-10-07 DIAGNOSIS — Z79899 Other long term (current) drug therapy: Secondary | ICD-10-CM

## 2013-10-07 DIAGNOSIS — Z8546 Personal history of malignant neoplasm of prostate: Secondary | ICD-10-CM

## 2013-10-07 DIAGNOSIS — Z87891 Personal history of nicotine dependence: Secondary | ICD-10-CM

## 2013-10-07 DIAGNOSIS — D689 Coagulation defect, unspecified: Secondary | ICD-10-CM

## 2013-10-07 DIAGNOSIS — F3289 Other specified depressive episodes: Secondary | ICD-10-CM | POA: Diagnosis present

## 2013-10-07 LAB — CBC WITH DIFFERENTIAL/PLATELET
BASOS ABS: 0 10*3/uL (ref 0.0–0.1)
Basophils Relative: 0 % (ref 0–1)
Eosinophils Absolute: 0.2 10*3/uL (ref 0.0–0.7)
Eosinophils Relative: 2 % (ref 0–5)
HCT: 39 % (ref 39.0–52.0)
HEMOGLOBIN: 13.4 g/dL (ref 13.0–17.0)
LYMPHS PCT: 7 % — AB (ref 12–46)
Lymphs Abs: 0.8 10*3/uL (ref 0.7–4.0)
MCH: 33.1 pg (ref 26.0–34.0)
MCHC: 34.4 g/dL (ref 30.0–36.0)
MCV: 96.3 fL (ref 78.0–100.0)
MONO ABS: 1.1 10*3/uL — AB (ref 0.1–1.0)
MONOS PCT: 9 % (ref 3–12)
NEUTROS ABS: 9.5 10*3/uL — AB (ref 1.7–7.7)
NEUTROS PCT: 82 % — AB (ref 43–77)
Platelets: 123 10*3/uL — ABNORMAL LOW (ref 150–400)
RBC: 4.05 MIL/uL — ABNORMAL LOW (ref 4.22–5.81)
RDW: 13.9 % (ref 11.5–15.5)
WBC: 11.6 10*3/uL — AB (ref 4.0–10.5)

## 2013-10-07 LAB — URINE MICROSCOPIC-ADD ON

## 2013-10-07 LAB — GLUCOSE, CAPILLARY
GLUCOSE-CAPILLARY: 160 mg/dL — AB (ref 70–99)
Glucose-Capillary: 202 mg/dL — ABNORMAL HIGH (ref 70–99)

## 2013-10-07 LAB — MAGNESIUM: Magnesium: 1.7 mg/dL (ref 1.5–2.5)

## 2013-10-07 LAB — URINALYSIS, ROUTINE W REFLEX MICROSCOPIC
Bilirubin Urine: NEGATIVE
Glucose, UA: NEGATIVE mg/dL
Ketones, ur: NEGATIVE mg/dL
Nitrite: POSITIVE — AB
Protein, ur: 100 mg/dL — AB
Specific Gravity, Urine: 1.016 (ref 1.005–1.030)
Urobilinogen, UA: 1 mg/dL (ref 0.0–1.0)
pH: 6.5 (ref 5.0–8.0)

## 2013-10-07 LAB — CBC
HEMATOCRIT: 36.6 % — AB (ref 39.0–52.0)
Hemoglobin: 12.4 g/dL — ABNORMAL LOW (ref 13.0–17.0)
MCH: 32.5 pg (ref 26.0–34.0)
MCHC: 33.9 g/dL (ref 30.0–36.0)
MCV: 95.8 fL (ref 78.0–100.0)
PLATELETS: 122 10*3/uL — AB (ref 150–400)
RBC: 3.82 MIL/uL — ABNORMAL LOW (ref 4.22–5.81)
RDW: 14 % (ref 11.5–15.5)
WBC: 10.1 10*3/uL (ref 4.0–10.5)

## 2013-10-07 LAB — POCT I-STAT, CHEM 8
BUN: 25 mg/dL — ABNORMAL HIGH (ref 6–23)
Calcium, Ion: 1.3 mmol/L (ref 1.13–1.30)
Chloride: 97 meq/L (ref 96–112)
Creatinine, Ser: 1.4 mg/dL — ABNORMAL HIGH (ref 0.50–1.35)
Glucose, Bld: 190 mg/dL — ABNORMAL HIGH (ref 70–99)
HCT: 43 % (ref 39.0–52.0)
Hemoglobin: 14.6 g/dL (ref 13.0–17.0)
Potassium: 3.9 meq/L (ref 3.7–5.3)
Sodium: 139 meq/L (ref 137–147)
TCO2: 28 mmol/L (ref 0–100)

## 2013-10-07 LAB — PHOSPHORUS: PHOSPHORUS: 3.3 mg/dL (ref 2.3–4.6)

## 2013-10-07 LAB — CREATININE, SERUM
Creatinine, Ser: 1.24 mg/dL (ref 0.50–1.35)
GFR calc Af Amer: 57 mL/min — ABNORMAL LOW (ref >90)
GFR calc non Af Amer: 49 mL/min — ABNORMAL LOW (ref >90)

## 2013-10-07 LAB — MRSA PCR SCREENING: MRSA by PCR: POSITIVE — AB

## 2013-10-07 MED ORDER — MORPHINE SULFATE 2 MG/ML IJ SOLN
2.0000 mg | INTRAMUSCULAR | Status: DC | PRN
  Filled 2013-10-07: qty 1

## 2013-10-07 MED ORDER — SODIUM CHLORIDE 0.9 % IV SOLN
INTRAVENOUS | Status: DC
  Administered 2013-10-07: 12:00:00 via INTRAVENOUS

## 2013-10-07 MED ORDER — HYDROCODONE-ACETAMINOPHEN 5-325 MG PO TABS
1.0000 | ORAL_TABLET | ORAL | Status: DC | PRN
  Administered 2013-10-07 – 2013-10-09 (×5): 1 via ORAL
  Administered 2013-10-10 – 2013-10-11 (×5): 2 via ORAL
  Administered 2013-10-11: 10:00:00 1 via ORAL
  Filled 2013-10-07: qty 1
  Filled 2013-10-07: qty 2
  Filled 2013-10-07: qty 1
  Filled 2013-10-07: qty 2
  Filled 2013-10-07: qty 1
  Filled 2013-10-07 (×2): qty 2
  Filled 2013-10-07: qty 1
  Filled 2013-10-07: qty 2
  Filled 2013-10-07 (×2): qty 1

## 2013-10-07 MED ORDER — PANTOPRAZOLE SODIUM 40 MG PO TBEC
40.0000 mg | DELAYED_RELEASE_TABLET | Freq: Every day | ORAL | Status: DC
  Administered 2013-10-08 – 2013-10-12 (×5): 40 mg via ORAL
  Filled 2013-10-07 (×6): qty 1

## 2013-10-07 MED ORDER — HEPARIN SODIUM (PORCINE) 5000 UNIT/ML IJ SOLN
5000.0000 [IU] | Freq: Three times a day (TID) | INTRAMUSCULAR | Status: DC
  Administered 2013-10-07 – 2013-10-09 (×5): 5000 [IU] via SUBCUTANEOUS
  Filled 2013-10-07 (×8): qty 1

## 2013-10-07 MED ORDER — BICALUTAMIDE 50 MG PO TABS
50.0000 mg | ORAL_TABLET | Freq: Every day | ORAL | Status: DC
  Administered 2013-10-08 – 2013-10-12 (×5): 50 mg via ORAL
  Filled 2013-10-07 (×7): qty 1

## 2013-10-07 MED ORDER — OXYBUTYNIN CHLORIDE 5 MG PO TABS
5.0000 mg | ORAL_TABLET | Freq: Every day | ORAL | Status: DC
  Administered 2013-10-07 – 2013-10-11 (×5): 5 mg via ORAL
  Filled 2013-10-07 (×6): qty 1

## 2013-10-07 MED ORDER — DOCUSATE SODIUM 100 MG PO CAPS
200.0000 mg | ORAL_CAPSULE | Freq: Every day | ORAL | Status: DC
  Administered 2013-10-08 – 2013-10-12 (×5): 200 mg via ORAL

## 2013-10-07 MED ORDER — NITROGLYCERIN 0.4 MG SL SUBL: 0.4000 mg | SUBLINGUAL_TABLET | SUBLINGUAL | Status: DC | PRN

## 2013-10-07 MED ORDER — FLUTICASONE PROPIONATE 50 MCG/ACT NA SUSP
2.0000 | Freq: Two times a day (BID) | NASAL | Status: DC
  Administered 2013-10-07 – 2013-10-12 (×10): 2 via NASAL
  Filled 2013-10-07: qty 16

## 2013-10-07 MED ORDER — HYDROMORPHONE HCL PF 1 MG/ML IJ SOLN
0.5000 mg | INTRAMUSCULAR | Status: DC | PRN
  Filled 2013-10-07: qty 1

## 2013-10-07 MED ORDER — SERTRALINE HCL 100 MG PO TABS
100.0000 mg | ORAL_TABLET | Freq: Every day | ORAL | Status: DC
  Administered 2013-10-08 – 2013-10-12 (×5): 100 mg via ORAL
  Filled 2013-10-07 (×5): qty 1

## 2013-10-07 MED ORDER — INSULIN ASPART 100 UNIT/ML ~~LOC~~ SOLN
0.0000 [IU] | Freq: Three times a day (TID) | SUBCUTANEOUS | Status: DC
  Administered 2013-10-07: 3 [IU] via SUBCUTANEOUS
  Administered 2013-10-08: 13:00:00 2 [IU] via SUBCUTANEOUS
  Administered 2013-10-08: 18:00:00 3 [IU] via SUBCUTANEOUS
  Administered 2013-10-09: 08:00:00 2 [IU] via SUBCUTANEOUS

## 2013-10-07 MED ORDER — FENTANYL CITRATE 0.05 MG/ML IJ SOLN
50.0000 ug | Freq: Once | INTRAMUSCULAR | Status: AC
  Administered 2013-10-07: 50 ug via INTRAVENOUS
  Filled 2013-10-07: qty 2

## 2013-10-07 MED ORDER — INSULIN GLARGINE 100 UNIT/ML ~~LOC~~ SOLN
16.0000 [IU] | Freq: Every day | SUBCUTANEOUS | Status: DC
  Administered 2013-10-07 – 2013-10-11 (×5): 16 [IU] via SUBCUTANEOUS
  Filled 2013-10-07 (×6): qty 0.16

## 2013-10-07 MED ORDER — SIMVASTATIN 40 MG PO TABS
40.0000 mg | ORAL_TABLET | Freq: Every day | ORAL | Status: DC
  Administered 2013-10-07 – 2013-10-11 (×5): 40 mg via ORAL
  Filled 2013-10-07 (×7): qty 1

## 2013-10-07 MED ORDER — POLYVINYL ALCOHOL 1.4 % OP SOLN
1.0000 [drp] | Freq: Two times a day (BID) | OPHTHALMIC | Status: DC | PRN
  Administered 2013-10-09 – 2013-10-11 (×3): 1 [drp] via OPHTHALMIC
  Filled 2013-10-07: qty 15

## 2013-10-07 MED ORDER — PANCRELIPASE (LIP-PROT-AMYL) 12000-38000 UNITS PO CPEP
4.0000 | ORAL_CAPSULE | Freq: Three times a day (TID) | ORAL | Status: DC
  Administered 2013-10-07 – 2013-10-11 (×12): 4 via ORAL
  Filled 2013-10-07 (×16): qty 4

## 2013-10-07 MED ORDER — METOPROLOL TARTRATE 50 MG PO TABS
50.0000 mg | ORAL_TABLET | Freq: Every day | ORAL | Status: DC
  Administered 2013-10-08 – 2013-10-12 (×5): 50 mg via ORAL
  Filled 2013-10-07 (×5): qty 1

## 2013-10-07 MED ORDER — ACETAMINOPHEN 325 MG PO TABS: 650.0000 mg | ORAL_TABLET | Freq: Three times a day (TID) | ORAL | Status: DC | PRN

## 2013-10-07 MED ORDER — MELATONIN 3 MG PO TABS: 3.0000 mg | ORAL_TABLET | Freq: Every day | ORAL | Status: DC

## 2013-10-07 NOTE — H&P (Signed)
[facility] History and Physical  Guy [name] DPO:[id number] DOB: August 09, 1920 DOA: 10/07/2013  Referring physician: Dr. Alvino Chapel PCP: Guy Patricia, MD   Chief Complaint: Low back pain  HPI: Guy [name] is a 80 y.o. male  With history of prior falls and vertebral plasty of L3 in the past presenting to the ED after recent fall complaining of low back pain. He states that the back pain has been present all day every day for the last few weeks. The problem has persisted and he feels has gotten worse. He denies any bladder incontinence. Also denies any lower extremity weakness in one leg more than the other although he does report pain more in the right leg in the left. As mentioned the pain is persistent and not getting better as a result patient presented to the ED for further evaluation. He has had tramadol for help with pain and also another pain medicine described as a strong pain medicine although wife is unable to tell me what pain medicine that is. But she is having a hard time taking care of him at home and as such given that his pain has gotten worse decided to bring him in for evaluation.  While in the ED CT of lumbar spine was obtained which showed an L1 compression fracture. We were consulted for further evaluation and recommendations.   Review of Systems:  10 point review of systems reviewed and otherwise negative unless listed above.  Past Medical History  Diagnosis Date  . Hypertension   . Diabetes mellitus without complication   . Arthritis   . Sciatica   . GERD (gastroesophageal reflux disease)   . Urge incontinence   . Vertigo   . Hyperlipidemia   . ED (erectile dysfunction)   . Gastric polyposis   . Osteoporosis   . Decreased libido   . Depression   . Hepatitis A   . Stroke   . History of blood transfusion     1946  . OA (osteoarthritis)   . Bladder cancer   . Prostate cancer     S/P prostatectomy; Lung metastasis  . CAD (coronary artery  disease) 1996    stent to the LAD   . Hyperlipidemia   . Type 2 diabetes mellitus    Past Surgical History  Procedure Laterality Date  . Carpal tunnel release    . Cataract extraction w/ intraocular lens  implant, bilateral  1998  . Prostatectomy    . Urinary sphincter implant    . Urinary sphincter implant revision    . Appendectomy    . Left hip surgery      Donated bone for bone graft to arm  . Penile prosthesis implant      S/P removal and re-implantation of new prosthesis  . Middle ear surgery    . Finger surgery      Left and right  . Hernia repair    . Bladder surgery    . Right arm bone graft      Pathological fracture  . Cardiac catheterization  11/27/2001    patent stent with mild in-stent restenosis.  . Myoview  06/12/2009    Persantine myoview EF 76%; Normal myoview  . Coronary angioplasty  1996    stenting to the LAD in [place].    Social History:  reports that he has quit smoking. He has never used smokeless tobacco. He reports that he does not drink alcohol or use illicit drugs.  Allergies  Allergen Reactions  .  Albuterol Sulfate Hfa [Kdc:Albuterol] Shortness Of Breath and Swelling  . Adhesive [Tape] Other (See Comments)    REACTION: SKIN BLISTERS  . Albuterol Swelling    Per pt., side of his neck began to swell with nebulized Albuterol in doctor's office.  . Amlodipine Other (See Comments)    Reaction unknown  . Contrast Media [Iodinated Diagnostic Agents] Other (See Comments)    Reaction unknown  . Lactose     Other reaction(s): GI Upset (intolerance)  . Metrizamide Other (See Comments)    Reaction unknown  . Norvasc [Amlodipine Besylate] Other (See Comments)    Reaction unknown  . Penicillins Other (See Comments) and Itching    REACTION: ITCHING HANDS  . Sulfa Drugs Cross Reactors Hives  . Sulfamethoxazole Rash    Family History  Problem Relation Age of Onset  . Heart failure Mother   . Bladder Cancer Father   . Pancreatic cancer Sister    . Lung cancer Sister   . Breast cancer Daughter   . Liver disease Son      Prior to Admission medications   Medication Sig Start Date End Date Taking? Authorizing Provider  acetaminophen (TYLENOL) 325 MG tablet Take 650 mg by mouth 3 (three) times daily as needed for mild pain or moderate pain.    Yes Historical Provider, MD  amLODipine (NORVASC) 10 MG tablet Take 10 mg by mouth daily.   Yes Historical Provider, MD  bicalutamide (CASODEX) 50 MG tablet Take 50 mg by mouth daily.   Yes Historical Provider, MD  Calcium Carbonate-Vitamin D (CALCIUM 600+D HIGH POTENCY) 600-400 MG-UNIT per tablet Take 1 tablet by mouth 2 (two) times daily.     Yes Historical Provider, MD  carboxymethylcellulose (REFRESH PLUS) 0.5 % SOLN Place 1 drop into both eyes 2 (two) times daily as needed (for dry eyes).   Yes Historical Provider, MD  docusate sodium (COLACE) 100 MG capsule Take 200 mg by mouth daily.    Yes Historical Provider, MD  fluticasone (FLONASE) 50 MCG/ACT nasal spray Place 2 sprays into the nose 2 (two) times daily.   Yes Historical Provider, MD  glimepiride (AMARYL) 4 MG tablet Take 4 mg by mouth daily before breakfast.   Yes Historical Provider, MD  hydrOXYzine (ATARAX/VISTARIL) 25 MG tablet Take 25 mg by mouth 2 (two) times daily.    Yes Historical Provider, MD  insulin aspart (NOVOLOG) 100 UNIT/ML injection Inject 0-6 Units into the skin 3 (three) times daily before meals. 0-150=0; 151-200=2u; 201-250=3u; 251-300=4u; 301-350=5u; over 350 =6u prior to meals 01/18/13  Yes Gerlene Fee, NP  insulin glargine (LANTUS) 100 UNIT/ML injection Inject 16 Units into the skin at bedtime.    Yes Historical Provider, MD  lipase/protease/amylase (CREON-12/PANCREASE) 12000 UNITS CPEP Take 4 capsules by mouth 3 (three) times daily.   Yes Historical Provider, MD  Melatonin 3 MG TABS Take 3 mg by mouth at bedtime.   Yes Historical Provider, MD  metoprolol (LOPRESSOR) 50 MG tablet Take 50 mg by mouth daily.   Yes  Historical Provider, MD  Multiple Vitamins-Minerals (MULTIVITAMINS THER. W/MINERALS) TABS Take 1 tablet by mouth every morning.    Yes Historical Provider, MD  omeprazole (PRILOSEC) 20 MG capsule Take 20 mg by mouth 2 (two) times daily.     Yes Historical Provider, MD  oxybutynin (DITROPAN) 5 MG tablet Take 5 mg by mouth at bedtime.   Yes Historical Provider, MD  sertraline (ZOLOFT) 100 MG tablet Take 100 mg by mouth daily.  Yes Historical Provider, MD  simvastatin (ZOCOR) 40 MG tablet Take 40 mg by mouth at bedtime.   Yes Historical Provider, MD  traMADol (ULTRAM) 50 MG tablet Take 50 mg by mouth every 6 (six) hours as needed for moderate pain or severe pain. 06/24/13  Yes Nathan R. Pickering, MD  nitroGLYCERIN (NITROSTAT) 0.4 MG SL tablet Place 0.4 mg under the tongue every 5 (five) minutes as needed for chest pain.     Historical Provider, MD   Physical Exam: Filed Vitals:   10/07/13 1500  BP: 120/90  Pulse: 67  Temp: 98.1 F (36.7 C)  Resp: 18    BP 120/90  Pulse 67  Temp(Src) 98.1 F (36.7 C) (Oral)  Resp 18  Ht 5\' 6"  (1.676 m)  Wt 64.411 kg (142 lb)  BMI 22.93 kg/m2  SpO2 96%  General:  Oriented to person and place but not time, Alert and awake Eyes: PERRL, normal lids, irises & conjunctiva ENT: grossly normal hearing, lips & tongue Neck: no LAD, masses or thyromegaly Cardiovascular: RRR, no m/r/g. No LE edema. Telemetry: SR, no arrhythmias  Respiratory: CTA bilaterally, no w/r/r. Normal respiratory effort. Abdomen: soft, ntnd Skin: no rash or induration seen on limited exam Musculoskeletal: grossly normal tone BUE/BLE Psychiatric: grossly normal mood and affect, speech fluent and appropriate Neurologic: Strength equal at lower extremities, patient has sensation intact at lower extremities with equal toe strength.           Labs on Admission:  Basic Metabolic Panel:  Recent Labs Lab 10/07/13 1233  NA 139  K 3.9  CL 97  GLUCOSE 190*  BUN 25*  CREATININE  1.40*   Liver Function Tests: No results found for this basename: AST, ALT, ALKPHOS, BILITOT, PROT, ALBUMIN,  in the last 168 hours No results found for this basename: LIPASE, AMYLASE,  in the last 168 hours No results found for this basename: AMMONIA,  in the last 168 hours CBC:  Recent Labs Lab 10/07/13 1220 10/07/13 1233  WBC 11.6*  --   NEUTROABS 9.5*  --   HGB 13.4 14.6  HCT 39.0 43.0  MCV 96.3  --   PLT 123*  --    Cardiac Enzymes: No results found for this basename: CKTOTAL, CKMB, CKMBINDEX, TROPONINI,  in the last 168 hours  BNP (last 3 results) No results found for this basename: PROBNP,  in the last 8760 hours CBG: No results found for this basename: GLUCAP,  in the last 168 hours  Radiological Exams on Admission: Ct Lumbar Spine Wo Contrast  10/07/2013   CLINICAL DATA:  Status post fall 9 days ago. Low back pain radiating into the right leg. History of bladder cancer and metastatic prostate cancer.  EXAM: CT LUMBAR SPINE WITHOUT CONTRAST  TECHNIQUE: Multidetector CT imaging of the lumbar spine was performed without intravenous contrast administration. Multiplanar CT image reconstructions were also generated.  COMPARISON:  Plain films lumbar spine 09/30/2013. MRI lumbar spine 11/19/2012.  FINDINGS: Bones appear osteopenic. The patient has a compression fracture deformity of L1 with approximately 40% vertebral body height loss centrally. There is no extension into the posterior elements and no bony retropulsion. The fracture appears acute or subacute with fracture lines visible and sharp fracture margins identified. There is a minimal superior endplate compression fracture of T12. Mild biconcave deformity of L4 is remote. Old L3 compression fracture is identified with postoperative change of vertebral augmentation noted.  The patient has convex right scoliosis. No lytic or sclerotic bony lesion is  identified. Visualized sacrum and posterior iliac wings appear intact. Imaged  intra-abdominal contents demonstrate aortic atherosclerosis.  T11-12: Minimal disc bulge without central canal or foraminal narrowing.  T12-L1: Shallow, calcified left paracentral protrusion without central canal or foraminal stenosis.  L1-2: Shallow disc bulge with some ligamentum flavum thickening and mild facet arthropathy. Central canal and foramina appear open.  L3-4: Ligamentum flavum thickening and a shallow disc bulge cause mild to moderate central canal narrowing. Foramina appear open.  L3-4: There is prominent ligamentum flavum thickening and a broad-based disc bulge. Moderately severe to severe central canal stenosis is present. The foramina are narrowed bilaterally, worse on the left.  L4-5: Bulky ligamentum flavum thickening and a shallow disc bulge result in severe central canal stenosis. There is right worse than left foraminal narrowing.  L5-S1:  Negative.  IMPRESSION: L1 superior endplate compression fracture with approximately 40% vertebral body height loss appears acute or subacute. There is no bony retropulsion and the fracture has an appearance most consistent with a senile osteoporotic injury.  Remote compression fractures of T12, L3 and L4.  Degenerative change appearing worst at L3-4 and L4-5 as described above.  Negative for CT evidence of metastatic disease.  Osteopenia.   Electronically Signed   By: Inge Rise M.D.   On: 10/07/2013 13:06    Assessment/Plan Principal Problem:   Compression fracture of L1 lumbar vertebra - Case discussed with spine specialist and at this point recommends corset brace. Should the patient continue to be in agony and much discomfort despite use of brace consistently for 2-3 weeks then  vertebroplasty should be considered. - Otherwise patient should wear corset brace for 10 weeks with pain medication and physical therapy. -Will place consult for physical therapy -Pain control.  Active Problems:   DM (diabetes mellitus) - SSI, diabetic diet,  cbg monitoring    Essential hypertension, benign - Continue metoprolol - hold amlodipine    CAD (coronary artery disease) -Simvastatin, stable, no chest pain reported - continue nitroglycerin prn chest discomfort.    Depression - stable continue zoloft    GERD (gastroesophageal reflux disease) - Protonix while in house, stable    Urinary incontinence - Continue chronic foley catheter. Patient has had a history of troubles and complications with penile implant.   Code Status: full Family Communication: Discussed with patient and wife at bedside Disposition Plan: Pending improvement in back pain. Physical therapy recommendations.  Time spent: > 60 minutes  Velvet Bathe [facility] Pager [phone]

## 2013-10-07 NOTE — Progress Notes (Signed)
PHARMACIST - PHYSICIAN ORDER COMMUNICATION  CONCERNING: P&T Medication Policy on Herbal Medications  DESCRIPTION:  This patient's order for:  Melatonin  has been noted.  This product(s) is classified as an "herbal" or natural product. Due to a lack of definitive safety studies or FDA approval, nonstandard manufacturing practices, plus the potential risk of unknown drug-drug interactions while on inpatient medications, the Pharmacy and Therapeutics Committee does not permit the use of "herbal" or natural products of this type within [facility].   ACTION TAKEN: The pharmacy department is unable to verify this order at this time and your patient has been informed of this safety policy. Please reevaluate patient's clinical condition at discharge and address if the herbal or natural product(s) should be resumed at that time.  Minda Ditto PharmD Pager [phone] 10/07/2013, 6:02 PM

## 2013-10-07 NOTE — Progress Notes (Signed)
CRITICAL VALUE ALERT  Critical value received: Positive MRSA PCR results  Date of notification:  10/07/13  Time of notification:  2224  Critical value read back:yes  Nurse who received alert:  Cavan Bearden Silk  Action: Pt placed on orange contact precautions.

## 2013-10-07 NOTE — ED Provider Notes (Signed)
CSN: [id number]     Arrival date & time 10/07/13  1030 History   First MD Initiated Contact with Patient 10/07/13 1144     Chief Complaint  Patient presents with  . Back Pain   (Consider location/radiation/quality/duration/timing/severity/associated sxs/prior Treatment) Patient is a 80 y.o. male presenting with back pain. The history is provided by the patient.  Back Pain Location:  Lumbar spine Associated symptoms: no abdominal pain, no chest pain, no headaches, no numbness and no weakness   patient had a fall onto his back 9 days ago. He was seen in the ED and diagnosed with a UTI and rib fracture. With negative lumbar xray at that time. He has had increased pain that has been uncontrolled at home. He has been unable to walk due to the pain. No weakness. He reportedly got a call to change antibiotics for his urinary tract infection.  Past Medical History  Diagnosis Date  . Hypertension   . Diabetes mellitus without complication   . Arthritis   . Sciatica   . GERD (gastroesophageal reflux disease)   . Urge incontinence   . Vertigo   . Hyperlipidemia   . ED (erectile dysfunction)   . Gastric polyposis   . Osteoporosis   . Decreased libido   . Depression   . Hepatitis A   . Stroke   . History of blood transfusion     1946  . OA (osteoarthritis)   . Bladder cancer   . Prostate cancer     S/P prostatectomy; Lung metastasis  . CAD (coronary artery disease) 1996    stent to the LAD   . Hyperlipidemia   . Type 2 diabetes mellitus    Past Surgical History  Procedure Laterality Date  . Carpal tunnel release    . Cataract extraction w/ intraocular lens  implant, bilateral  1998  . Prostatectomy    . Urinary sphincter implant    . Urinary sphincter implant revision    . Appendectomy    . Left hip surgery      Donated bone for bone graft to arm  . Penile prosthesis implant      S/P removal and re-implantation of new prosthesis  . Middle ear surgery    . Finger surgery       Left and right  . Hernia repair    . Bladder surgery    . Right arm bone graft      Pathological fracture  . Cardiac catheterization  11/27/2001    patent stent with mild in-stent restenosis.  . Myoview  06/12/2009    Persantine myoview EF 76%; Normal myoview  . Coronary angioplasty  1996    stenting to the LAD in [place].    Family History  Problem Relation Age of Onset  . Heart failure Mother   . Bladder Cancer Father   . Pancreatic cancer Sister   . Lung cancer Sister   . Breast cancer Daughter   . Liver disease Son    History  Substance Use Topics  . Smoking status: Former Research scientist (life sciences)  . Smokeless tobacco: Never Used  . Alcohol Use: No    Review of Systems  Constitutional: Negative for activity change and appetite change.  Eyes: Negative for pain.  Respiratory: Negative for chest tightness and shortness of breath.   Cardiovascular: Negative for chest pain and leg swelling.  Gastrointestinal: Negative for nausea, vomiting, abdominal pain and diarrhea.  Genitourinary: Negative for flank pain.  Musculoskeletal: Positive for back pain.  Negative for neck stiffness.  Skin: Negative for rash.  Neurological: Negative for weakness, numbness and headaches.  Psychiatric/Behavioral: Negative for behavioral problems.    Allergies  Albuterol sulfate hfa; Adhesive; Albuterol; Amlodipine; Contrast media; Lactose; Metrizamide; Norvasc; Penicillins; Sulfa drugs cross reactors; and Sulfamethoxazole  Home Medications   No current outpatient prescriptions on file. BP 120/90  Pulse 67  Temp(Src) 98.1 F (36.7 C) (Oral)  Resp 18  Ht 5\' 6"  (1.676 m)  Wt 142 lb (64.411 kg)  BMI 22.93 kg/m2  SpO2 96% Physical Exam  Constitutional: He appears well-developed and well-nourished.  HENT:  Head: Normocephalic.  Neck: Normal range of motion.  Cardiovascular: Normal rate and regular rhythm.   Pulmonary/Chest: Effort normal.  Abdominal: Soft. There is no tenderness.  Musculoskeletal: He  exhibits tenderness.  Moderate lower lumbar tenderness. No step-off or deformity. Strength and sensation grossly intact in bilateral lower legs.  Neurological: He is alert.  Skin: Skin is warm. No rash noted.    ED Course  Procedures (including critical care time) Labs Review Labs Reviewed  CBC WITH DIFFERENTIAL - Abnormal; Notable for the following:    WBC 11.6 (*)    RBC 4.05 (*)    Platelets 123 (*)    Neutrophils Relative % 82 (*)    Neutro Abs 9.5 (*)    Lymphocytes Relative 7 (*)    Monocytes Absolute 1.1 (*)    All other components within normal limits  URINALYSIS, ROUTINE W REFLEX MICROSCOPIC - Abnormal; Notable for the following:    APPearance TURBID (*)    Hgb urine dipstick MODERATE (*)    Protein, ur 100 (*)    Nitrite POSITIVE (*)    Leukocytes, UA LARGE (*)    All other components within normal limits  POCT I-STAT, CHEM 8 - Abnormal; Notable for the following:    BUN 25 (*)    Creatinine, Ser 1.40 (*)    Glucose, Bld 190 (*)    All other components within normal limits  MRSA PCR SCREENING  URINE MICROSCOPIC-ADD ON  CBC  CREATININE, SERUM  TSH  MAGNESIUM  PHOSPHORUS   Imaging Review Ct Lumbar Spine Wo Contrast  10/07/2013   CLINICAL DATA:  Status post fall 9 days ago. Low back pain radiating into the right leg. History of bladder cancer and metastatic prostate cancer.  EXAM: CT LUMBAR SPINE WITHOUT CONTRAST  TECHNIQUE: Multidetector CT imaging of the lumbar spine was performed without intravenous contrast administration. Multiplanar CT image reconstructions were also generated.  COMPARISON:  Plain films lumbar spine 09/30/2013. MRI lumbar spine 11/19/2012.  FINDINGS: Bones appear osteopenic. The patient has a compression fracture deformity of L1 with approximately 40% vertebral body height loss centrally. There is no extension into the posterior elements and no bony retropulsion. The fracture appears acute or subacute with fracture lines visible and sharp fracture  margins identified. There is a minimal superior endplate compression fracture of T12. Mild biconcave deformity of L4 is remote. Old L3 compression fracture is identified with postoperative change of vertebral augmentation noted.  The patient has convex right scoliosis. No lytic or sclerotic bony lesion is identified. Visualized sacrum and posterior iliac wings appear intact. Imaged intra-abdominal contents demonstrate aortic atherosclerosis.  T11-12: Minimal disc bulge without central canal or foraminal narrowing.  T12-L1: Shallow, calcified left paracentral protrusion without central canal or foraminal stenosis.  L1-2: Shallow disc bulge with some ligamentum flavum thickening and mild facet arthropathy. Central canal and foramina appear open.  L3-4: Ligamentum flavum thickening and  a shallow disc bulge cause mild to moderate central canal narrowing. Foramina appear open.  L3-4: There is prominent ligamentum flavum thickening and a broad-based disc bulge. Moderately severe to severe central canal stenosis is present. The foramina are narrowed bilaterally, worse on the left.  L4-5: Bulky ligamentum flavum thickening and a shallow disc bulge result in severe central canal stenosis. There is right worse than left foraminal narrowing.  L5-S1:  Negative.  IMPRESSION: L1 superior endplate compression fracture with approximately 40% vertebral body height loss appears acute or subacute. There is no bony retropulsion and the fracture has an appearance most consistent with a senile osteoporotic injury.  Remote compression fractures of T12, L3 and L4.  Degenerative change appearing worst at L3-4 and L4-5 as described above.  Negative for CT evidence of metastatic disease.  Osteopenia.   Electronically Signed   By: Inge Rise M.D.   On: 10/07/2013 13:06    EKG Interpretation    Date/Time:    Ventricular Rate:    PR Interval:    QRS Duration:   QT Interval:    QTC Calculation:   R Axis:     Text  Interpretation:              MDM   1. Lumbar compression fracture    Patient with back pain. Lumbar compression fracture and likely from recent fall. His been able to manage at home. Will admit to internal medicine for pain control and likely placement.    Jasper Riling. Alvino Chapel, MD 10/07/13 [phone]

## 2013-10-07 NOTE — ED Notes (Signed)
Patient's wife reports that the patient was seen on 09/30/13 and was diagnosed with a fractured rib, but patient has been having severe lower back pain and is now unable to walk due to the pain.

## 2013-10-07 NOTE — ED Notes (Signed)
Bed: WA05 Expected date:  Expected time:  Means of arrival:  Comments: 

## 2013-10-08 LAB — CBC
HCT: 34.9 % — ABNORMAL LOW (ref 39.0–52.0)
HEMOGLOBIN: 12.1 g/dL — AB (ref 13.0–17.0)
MCH: 33 pg (ref 26.0–34.0)
MCHC: 34.7 g/dL (ref 30.0–36.0)
MCV: 95.1 fL (ref 78.0–100.0)
Platelets: 115 10*3/uL — ABNORMAL LOW (ref 150–400)
RBC: 3.67 MIL/uL — ABNORMAL LOW (ref 4.22–5.81)
RDW: 13.8 % (ref 11.5–15.5)
WBC: 10 10*3/uL (ref 4.0–10.5)

## 2013-10-08 LAB — BASIC METABOLIC PANEL
BUN: 20 mg/dL (ref 6–23)
CALCIUM: 9.2 mg/dL (ref 8.4–10.5)
CO2: 27 meq/L (ref 19–32)
Chloride: 97 mEq/L (ref 96–112)
Creatinine, Ser: 1.3 mg/dL (ref 0.50–1.35)
GFR calc Af Amer: 54 mL/min — ABNORMAL LOW (ref >90)
GFR calc non Af Amer: 46 mL/min — ABNORMAL LOW (ref >90)
GLUCOSE: 116 mg/dL — AB (ref 70–99)
POTASSIUM: 4.2 meq/L (ref 3.7–5.3)
Sodium: 136 mEq/L — ABNORMAL LOW (ref 137–147)

## 2013-10-08 LAB — GLUCOSE, CAPILLARY
GLUCOSE-CAPILLARY: 214 mg/dL — AB (ref 70–99)
Glucose-Capillary: 115 mg/dL — ABNORMAL HIGH (ref 70–99)
Glucose-Capillary: 167 mg/dL — ABNORMAL HIGH (ref 70–99)
Glucose-Capillary: 181 mg/dL — ABNORMAL HIGH (ref 70–99)

## 2013-10-08 LAB — TSH: TSH: 1.436 u[IU]/mL (ref 0.350–4.500)

## 2013-10-08 MED ORDER — LEVOFLOXACIN IN D5W 500 MG/100ML IV SOLN
500.0000 mg | Freq: Every day | INTRAVENOUS | Status: DC
  Administered 2013-10-08 – 2013-10-09 (×2): 500 mg via INTRAVENOUS
  Filled 2013-10-08 (×2): qty 100

## 2013-10-08 MED ORDER — ENSURE COMPLETE PO LIQD
237.0000 mL | Freq: Three times a day (TID) | ORAL | Status: DC
  Administered 2013-10-08 – 2013-10-09 (×2): 237 mL via ORAL

## 2013-10-08 NOTE — Progress Notes (Addendum)
Patient ID: Guy [name], male   DOB: 09-Jun-1920, 80 y.o.   MRN: [id number]  [facility] PROGRESS NOTE  [name] HWE:[id number] DOB: 1919/11/24 DOA: 10/07/2013 PCP: [name], MD  Brief narrative: 80 y.o. male history of prior falls and vertebroplasty of L3 in the past presenting to the ED after recent fall and with main concern of persistent and throbbing low back pain. Pain is occasionally radiating to lower extremities and is worse with ambulation, no specific alleviating factors. Pt denies numbness or tingling, no specific focal neurological symptoms. In ED CT of lumbar spine showed an L1 compression fracture. We were consulted for further evaluation and recommendations.   Principal Problem:   Compression fracture of L1 lumbar vertebra - pt still in significant pain - will ask interventional radiology for further recommendations - continue analgesia as needed and PT if pt able to tolerate  Active Problems:   DM (diabetes mellitus) - reasonable inpatient control - continue Lantus QHS and SSI   Essential hypertension, benign - BP slightly elevated and possibly secondary to acute pain - continue Metoprolol 50 mg PO QD for now - if BP persistently > 140/90 we will consider additional antihypertensive    Depression - stable - continue home medical regimen    GERD (gastroesophageal reflux disease) - stable, continue PPI   Urinary incontinence - oxybutynin daily as per home medical regimen    HLD - continue statin    Acute renal failure - secondary to pre renal etiology - resolve, Cr is WNL this AM   Recent UTI - with E. Coli and Klebsiella - pt not sure if he was treated - place on Levaquin QD  Consultants:  Interventional Radiology   Procedures/Studies:  Dg Cervical Spine 2 Or 3 Views   10/07/2013  Significantly diminished exam detail. If there is a high clinical concern for cervical spine fracture then a cervical spine CT should be obtained.  Cervical  spondylosis.     Ct Lumbar Spine Wo Contrast  10/07/2013   L1 superior endplate compression fracture with approximately 40% vertebral body height loss appears acute or subacute. There is no bony retropulsion and the fracture has an appearance most consistent with a senile osteoporotic injury.  Remote compression fractures of T12, L3 and L4.  Degenerative change appearing worst at L3-4 and L4-5 as described above.  Negative for CT evidence of metastatic disease.  Osteopenia.     Antibiotics:  Levaquin 01/09 -->  Code Status: Full Family Communication: Pt at bedside, wife over the phone  Disposition Plan: Home when medically stable  HPI/Subjective: No events overnight.   Objective: Filed Vitals:   10/07/13 1500 10/07/13 2127 10/07/13 2251 10/08/13 0554  BP: 120/90 165/97 170/77 162/77  Pulse: 67 66  64  Temp: 98.1 F (36.7 C) 99.8 F (37.7 C)  98.6 F (37 C)  TempSrc: Oral Oral  Oral  Resp: 18 17  18   Height: 5\' 6"  (1.676 m)     Weight: 64.411 kg (142 lb)     SpO2: 96% 94%  92%    Intake/Output Summary (Last 24 hours) at 10/08/13 0949 Last data filed at 10/08/13 0659  Gross per 24 hour  Intake    480 ml  Output   1475 ml  Net   -995 ml    Exam:   General:  Pt is alert, follows commands appropriately, not in acute distress, HOH  Cardiovascular: Regular rate and rhythm, S1/S2, no murmurs, no rubs, no gallops  Respiratory:  Clear to auscultation bilaterally, no wheezing, no crackles, no rhonchi  Abdomen: Soft, non tender, non distended, bowel sounds present, no guarding  Extremities: No edema, pulses DP and PT palpable bilaterally  Neuro: Grossly nonfocal  Data Reviewed: Basic Metabolic Panel:  Recent Labs Lab 10/07/13 1233 10/07/13 1630 10/08/13 0526  NA 139  --  136*  K 3.9  --  4.2  CL 97  --  97  CO2  --   --  27  GLUCOSE 190*  --  116*  BUN 25*  --  20  CREATININE 1.40* 1.24 1.30  CALCIUM  --   --  9.2  MG  --  1.7  --   PHOS  --  3.3  --     CBC:  Recent Labs Lab 10/07/13 1220 10/07/13 1233 10/07/13 1630 10/08/13 0526  WBC 11.6*  --  10.1 10.0  NEUTROABS 9.5*  --   --   --   HGB 13.4 14.6 12.4* 12.1*  HCT 39.0 43.0 36.6* 34.9*  MCV 96.3  --  95.8 95.1  PLT 123*  --  122* 115*   CBG:  Recent Labs Lab 10/07/13 1837 10/07/13 2130 10/08/13 0734  GLUCAP 202* 160* 115*    Recent Results (from the past 240 hour(s))  URINE CULTURE     Status: None   Collection Time    09/30/13  4:42 PM      Result Value Range Status   Specimen Description URINE, CATHETERIZED   Final   Special Requests NONE   Final   Culture  Setup Time     Final   Value: 10/01/2013 01:25     Performed at [facility]     Final   Value: >=100,000 COLONIES/ML     Performed at [facility]   Culture     Final   Value: KLEBSIELLA PNEUMONIAE     ESCHERICHIA COLI     Performed at [facility]   Report Status 10/04/2013 FINAL   Final   Organism ID, Bacteria KLEBSIELLA PNEUMONIAE   Final   Organism ID, Bacteria ESCHERICHIA COLI   Final  MRSA PCR SCREENING     Status: Abnormal   Collection Time    10/07/13  3:41 PM      Result Value Range Status   MRSA by PCR POSITIVE (*) NEGATIVE Final   Comment:            The GeneXpert MRSA Assay (FDA     approved for NASAL specimens     only), is one component of a     comprehensive MRSA colonization     surveillance program. It is not     intended to diagnose MRSA     infection nor to guide or     monitor treatment for     MRSA infections.     RESULT CALLED TO, READ BACK BY AND VERIFIED WITH:     Servando Salina [id number] @ [id number] BY J SCOTTON     Scheduled Meds: . bicalutamide  50 mg Oral Daily  . docusate sodium  200 mg Oral Daily  . fluticasone  2 spray Each Nare BID  . heparin  5,000 Units Subcutaneous Q8H  . insulin aspart  0-9 Units Subcutaneous TID WC  . insulin glargine  16 Units Subcutaneous QHS  . lipase/protease/amylase  4 capsule Oral TID  .  metoprolol  50 mg Oral Daily  . oxybutynin  5 mg Oral  QHS  . pantoprazole  40 mg Oral Daily  . sertraline  100 mg Oral Daily  . simvastatin  40 mg Oral QHS   Continuous Infusions:  Faye Ramsay, MD  [facility] Pager [phone]  If 7PM-7AM, please contact night-coverage [email or web address] Password [email or web address] 10/08/2013, 9:49 AM   LOS: 1 day

## 2013-10-08 NOTE — Care Management Note (Signed)
    Page 1 of 1   10/08/2013     3:53:29 PM   CARE MANAGEMENT NOTE 10/08/2013  Patient:  Guy [name], Guy [name]   Account Number:  [id number]  Date Initiated:  10/08/2013  Documentation initiated by:  Sherrin Daisy  Subjective/Objective Assessment:   dx compression fracture, UTI     Action/Plan:   Pt is from home. Recommendation for SNF per physical therapy eval. Referral to CSW. CM wil follow if needed.   Anticipated DC Date:  10/10/2013   Anticipated DC Plan:  SKILLED NURSING FACILITY  In-house referral  Clinical Social Worker      DC Planning Services  CM consult      Choice offered to / List presented to:             Status of service:  Completed, signed off Medicare Important Message given?   (If response is "NO", the following Medicare IM given date fields will be blank) Date Medicare IM given:   Date Additional Medicare IM given:    Discharge Disposition:    Per UR Regulation:  Reviewed for med. necessity/level of care/duration of stay  If discussed at [place] of Stay Meetings, dates discussed:    Comments:

## 2013-10-08 NOTE — Evaluation (Signed)
Physical Therapy Evaluation Patient Details Name: Guy [name] MRN: [id number] DOB: September 29, 1920 Today's Date: 10/08/2013 Time: 4401-0272 PT Time Calculation (min): 28 min  PT Assessment / Plan / Recommendation History of Present Illness  80yo male With history of prior falls and vertebral plasty of L3 in the past presenting to the ED after recent fall complaining of low back pain. He states that the back pain has been present all day every day for the last few weeks. The problem has persisted and he feels has gotten worse. He denies any bladder incontinence. Also denies any lower extremity weakness in one leg more than the other although he does report pain more in the right leg in the left. As mentioned the pain is persistent and not getting better as a result patient presented to the ED for further evaluation. He has had tramadol for help with pain and also another pain medicine described as a strong pain medicine although wife is unable to tell me what pain medicine that is. But she is having a hard time taking care of him at home and as such given that his pain has gotten worse decided to bring him in for evaluation.  Clinical Impression  Pt will benefit from PT to address deficits below    PT Assessment  Patient needs continued PT services    Follow Up Recommendations  SNF    Does the patient have the potential to tolerate intense rehabilitation      Barriers to Discharge        Equipment Recommendations  None recommended by PT    Recommendations for Other Services     Frequency Min 3X/week    Precautions / Restrictions Precautions Precautions: Back;Fall Precaution Comments: pt to have corset per MD notes, type or donning of brace not specified; no orders; will leave note for clarification Required Braces or Orthoses: Spinal Brace Spinal Brace: Lumbar corset;Applied in supine position   Pertinent Vitals/Pain Back pain 10/10       Mobility  Bed Mobility Overal bed  mobility: Needs Assistance Bed Mobility: Rolling;Sidelying to Sit Rolling: Min assist Sidelying to sit: Min assist General bed mobility comments: multi-modal cues for safe technique, back precautions Transfers Overall transfer level: Needs assistance Equipment used: Rolling walker (2 wheeled) Transfers: Sit to/from [facility] Sit to Stand: Min assist;Mod assist Stand pivot transfers: Min assist Ambulation/Gait Ambulation/Gait assistance: Min assist;Mod assist Ambulation Distance (Feet): 5 Feet Assistive device: Rolling walker (2 wheeled) Gait Pattern/deviations: Step-to pattern;Trunk flexed    Exercises     PT Diagnosis: Difficulty walking;Generalized weakness  PT Problem List: Decreased strength;Decreased activity tolerance;Decreased balance;Decreased mobility;Decreased knowledge of precautions PT Treatment Interventions: DME instruction;Gait training;Functional mobility training;Therapeutic activities;Therapeutic exercise;Patient/family education     PT Goals(Current goals can be found in the care plan section) Acute Rehab PT Goals Time For Goal Achievement: 10/15/13 Potential to Achieve Goals: Good  Visit Information  History of Present Illness: 80yo male With history of prior falls and vertebral plasty of L3 in the past presenting to the ED after recent fall complaining of low back pain. He states that the back pain has been present all day every day for the last few weeks. The problem has persisted and he feels has gotten worse. He denies any bladder incontinence. Also denies any lower extremity weakness in one leg more than the other although he does report pain more in the right leg in the left. As mentioned the pain is persistent and not getting better as a  result patient presented to the ED for further evaluation. He has had tramadol for help with pain and also another pain medicine described as a strong pain medicine although wife is unable to tell me what  pain medicine that is. But she is having a hard time taking care of him at home and as such given that his pain has gotten worse decided to bring him in for evaluation.       Prior [place] expects to be discharged to:: Unsure Living Arrangements: Spouse/significant other Prior Function Level of Independence: Independent with assistive device(s) Communication Communication: HOH    Cognition  Cognition Arousal/Alertness: Awake/alert Behavior During Therapy: WFL for tasks assessed/performed Overall Cognitive Status: Within Functional Limits for tasks assessed    Extremity/Trunk Assessment Upper Extremity Assessment Upper Extremity Assessment: Defer to OT evaluation Lower Extremity Assessment Lower Extremity Assessment: Generalized weakness   Balance Balance Overall balance assessment: Needs assistance Standing balance-Leahy Scale: Poor  End of Session PT - End of Session Equipment Utilized During Treatment: Gait belt Activity Tolerance: Patient limited by pain Patient left: in chair;with call bell/phone within reach Nurse Communication: Patient requests pain meds;Mobility status  GP     [facility] 10/08/2013, 9:59 AM

## 2013-10-08 NOTE — Progress Notes (Signed)
INITIAL NUTRITION ASSESSMENT  DOCUMENTATION CODES Per approved criteria  -Not Applicable   INTERVENTION: - Ensure Complete TID - Encouraged pt to consume high protein foods at mealtimes and to eat small frequent meals at home - Will continue to monitor   NUTRITION DIAGNOSIS: Inadequate oral intake related to poor appetite as evidenced by 25% meal intake.    Goal: Pt to consume >90% of meals/supplements   Monitor:  Weights, labs, intake  Reason for Assessment: Malnutrition screening tool   80 y.o. male  Admitting Dx: Compression fracture of L1 lumbar vertebra  ASSESSMENT: Pt with recent fall 9 days PTA. Hx of HTN, DM, GERD, hyperlipidemia, osteoporosis, hepatitis A, stroke, bladder CA, prostate CA, and CAD. Admitted with a lumbar compression fracture.   Met with pt who reports poor appetite at home which his wife is worried about. States he eats 3 meals/day but only a few bites at mealtimes. States he was drinking 2 Ensure/day. Reports he's lost 40 pounds in the past 3 months however weight trend shows pt's weight up 3 pounds in the past 4 months. Pt reports at home he was coughing on water but had no problems swallowing Ensure. States he has trouble swallowing pills and does better eating soft foods. Due to pt's back pain, did not perform nutrition focused physical exam.   Height: Ht Readings from Last 1 Encounters:  10/07/13 _0  (1.676 m)    Weight: Wt Readings from Last 1 Encounters:  10/07/13 142 lb (64.411 kg)    Ideal Body Weight: 142 lb   % Ideal Body Weight: 100%   Wt Readings from Last 10 Encounters:  10/07/13 142 lb (64.411 kg)  06/03/13 139 lb (63.05 kg)  04/16/13 136 lb 12.8 oz (62.052 kg)  04/13/13 136 lb 12.8 oz (62.052 kg)  04/09/13 138 lb 9.6 oz (62.869 kg)  04/05/13 138 lb 9.6 oz (62.869 kg)  04/01/13 138 lb 4.8 oz (62.732 kg)  03/29/13 138 lb 9.6 oz (62.869 kg)  03/20/13 137 lb 6.4 oz (62.324 kg)  02/18/13 137 lb (62.143 kg)    Usual Body  Weight: 182 lb per pt  % Usual Body Weight: 78%  BMI:  Body mass index is 22.93 kg/(m^2).  Estimated Nutritional Needs: Kcal: 7673-4193 Protein: 75-90g Fluid: 1.6-1.8L/day  Skin: Intact   Diet Order: Carb Control  EDUCATION NEEDS: -No education needs identified at this time   Intake/Output Summary (Last 24 hours) at 10/08/13 1411 Last data filed at 10/08/13 1407  Gross per 24 hour  Intake    840 ml  Output   1995 ml  Net  -1155 ml    Last BM: 1/8  Labs:   Recent Labs Lab 10/07/13 1233 10/07/13 1630 10/08/13 0526  NA 139  --  136*  K 3.9  --  4.2  CL 97  --  97  CO2  --   --  27  BUN 25*  --  20  CREATININE 1.40* 1.24 1.30  CALCIUM  --   --  9.2  MG  --  1.7  --   PHOS  --  3.3  --   GLUCOSE 190*  --  116*    CBG (last 3)   Recent Labs  10/07/13 2130 10/08/13 0734 10/08/13 1211  GLUCAP 160* 115* 167*    Scheduled Meds: . bicalutamide  50 mg Oral Daily  . docusate sodium  200 mg Oral Daily  . fluticasone  2 spray Each Nare BID  . heparin  5,000  Units Subcutaneous Q8H  . insulin aspart  0-9 Units Subcutaneous TID WC  . insulin glargine  16 Units Subcutaneous QHS  . levofloxacin (LEVAQUIN) IV  500 mg Intravenous Daily  . lipase/protease/amylase  4 capsule Oral TID  . metoprolol  50 mg Oral Daily  . oxybutynin  5 mg Oral QHS  . pantoprazole  40 mg Oral Daily  . sertraline  100 mg Oral Daily  . simvastatin  40 mg Oral QHS    Continuous Infusions:   Past Medical History  Diagnosis Date  . Hypertension   . Diabetes mellitus without complication   . Arthritis   . Sciatica   . GERD (gastroesophageal reflux disease)   . Urge incontinence   . Vertigo   . Hyperlipidemia   . ED (erectile dysfunction)   . Gastric polyposis   . Osteoporosis   . Decreased libido   . Depression   . Hepatitis A   . Stroke   . History of blood transfusion     1946  . OA (osteoarthritis)   . Bladder cancer   . Prostate cancer     S/P prostatectomy; Lung  metastasis  . CAD (coronary artery disease) 1996    stent to the LAD   . Hyperlipidemia   . Type 2 diabetes mellitus     Past Surgical History  Procedure Laterality Date  . Carpal tunnel release    . Cataract extraction w/ intraocular lens  implant, bilateral  1998  . Prostatectomy    . Urinary sphincter implant    . Urinary sphincter implant revision    . Appendectomy    . Left hip surgery      Donated bone for bone graft to arm  . Penile prosthesis implant      S/P removal and re-implantation of new prosthesis  . Middle ear surgery    . Finger surgery      Left and right  . Hernia repair    . Bladder surgery    . Right arm bone graft      Pathological fracture  . Cardiac catheterization  11/27/2001    patent stent with mild in-stent restenosis.  . Myoview  06/12/2009    Persantine myoview EF 76%; Normal myoview  . Coronary angioplasty  1996    stenting to the LAD in [place].     Mikey College MS, [place], [place] Pager [phone] After Hours Pager

## 2013-10-08 NOTE — Progress Notes (Signed)
Clinical Social Work Department CLINICAL SOCIAL WORK PLACEMENT NOTE 10/08/2013  Patient:  Guy [name], Guy [name]  Account Number:  [id number] Admit date:  10/07/2013  Clinical Social Worker:  Werner Lean, LCSW  Date/time:  10/08/2013 04:22 PM  Clinical Social Work is seeking post-discharge placement for this patient at the following level of care:   SKILLED NURSING   (*CSW will update this form in Epic as items are completed)   10/08/2013  Patient/family provided with [place] Department of Clinical Social Work's list of facilities offering this level of care within the geographic area requested by the patient (or if unable, by the patient's family).    Patient/family informed of their freedom to choose among providers that offer the needed level of care, that participate in Medicare, Medicaid or managed care program needed by the patient, have an available bed and are willing to accept the patient.    Patient/family informed of [facility]' ownership interest in [facility], as well as of the fact that they are under no obligation to receive care at this facility.  PASARR submitted to EDS on  PASARR number received from EDS on 11/18/2012  FL2 transmitted to all facilities in geographic area requested by pt/family on  10/08/2013 FL2 transmitted to all facilities within larger geographic area on   Patient informed that his/her managed care company has contracts with or will negotiate with  certain facilities, including the following:     Patient/family informed of bed offers received:   Patient chooses bed at  Physician recommends and patient chooses bed at    Patient to be transferred to  on   Patient to be transferred to facility by   The following physician request were entered in Epic:   Additional Comments:  Werner Lean LCSW [phone]

## 2013-10-08 NOTE — Plan of Care (Signed)
Problem: Phase I Progression Outcomes Goal: Voiding-avoid urinary catheter unless indicated Outcome: Not Met (add Reason) Chronic catheter

## 2013-10-08 NOTE — Progress Notes (Signed)
Clinical Social Work Department BRIEF PSYCHOSOCIAL ASSESSMENT 10/08/2013  Patient:  Guy [name], Guy [name]     Account Number:  [id number]     Admit date:  10/07/2013  Clinical Social Worker:  Lacie Scotts  Date/Time:  10/08/2013 04:14 PM  Referred by:  Care Management  Date Referred:  10/08/2013 Referred for  SNF Placement   Other Referral:   Interview type:  Patient Other interview type:    PSYCHOSOCIAL DATA Living Status:  WIFE Admitted from facility:   Level of care:   Primary support name:  Harlon Flor Primary support relationship to patient:  SPOUSE Degree of support available:   limited    CURRENT CONCERNS Current Concerns  Post-Acute Placement   Other Concerns:    SOCIAL WORK ASSESSMENT / PLAN Pt is a 80 yr old gentleman living at home prior to hospitalization. CSW met with pt / spouse to assist with d/c planning. PT has recommended ST Rehab following hospital d/c. Pt / spouse are in agreement with this plan. CSW to initiate SNF search. Bed offers will be provided as received.   Assessment/plan status:  Psychosocial Support/Ongoing Assessment of Needs Other assessment/ plan:   Information/referral to community resources:   SNF list provided.    PATIENT'S/FAMILY'S RESPONSE TO PLAN OF CARE: " My husband has been in rehab before. He was at [facility]. They took good care of him. "   Werner Lean LCSW [phone]

## 2013-10-08 NOTE — Evaluation (Signed)
Occupational Therapy Evaluation Patient Details Name: Guy [name] MRN: [id number] DOB: 08-26-1920 Today's Date: 10/08/2013 Time: 5790-3833 OT Time Calculation (min): 31 min  OT Assessment / Plan / Recommendation History of present illness 80yo male With history of prior falls and vertebral plasty of L3 in the past presenting to the ED after recent fall complaining of low back pain. He states that the back pain has been present all day every day for the last few weeks. The problem has persisted and he feels has gotten worse. He denies any bladder incontinence. Also denies any lower extremity weakness in one leg more than the other although he does report pain more in the right leg in the left. As mentioned the pain is persistent and not getting better as a result patient presented to the ED for further evaluation. He has had tramadol for help with pain and also another pain medicine described as a strong pain medicine although wife is unable to tell me what pain medicine that is. But she is having a hard time taking care of him at home and as such given that his pain has gotten worse decided to bring him in for evaluation.   Clinical Impression   Pt currently limited by 10/10 pain this session. He will benefit from skilled OT services to maximize ADL independence for next venue of care.     OT Assessment  Patient needs continued OT Services    Follow Up Recommendations  SNF;Supervision/Assistance - 24 hour    Barriers to Discharge      Equipment Recommendations  3 in 1 bedside comode    Recommendations for Other Services    Frequency  Min 2X/week    Precautions / Restrictions Precautions Precautions: Back;Fall Precaution Comments: pt to have corset per MD notes, type or donning of brace not specified; no orders; will leave note for clarification Required Braces or Orthoses: Spinal Brace Spinal Brace: Lumbar corset;Applied in supine position   Pertinent Vitals/Pain 10/10 pain with  activity; nursing informed. Reposition.   ADL  Eating/Feeding: Performed;Independent Where Assessed - Eating/Feeding: Chair Grooming: Wash/dry face;Set up Where Assessed - Grooming: Supported sitting Upper Body Bathing: Performed;Chest;Right arm;Left arm;Abdomen;Minimal assistance Where Assessed - Upper Body Bathing: Supported sitting Lower Body Bathing: Maximal assistance;Performed Where Assessed - Lower Body Bathing: Supported sit to stand Upper Body Dressing: Simulated;Moderate assistance Where Assessed - Upper Body Dressing: Supported sitting Lower Body Dressing: Simulated;Maximal assistance;+1 Total assistance Where Assessed - Lower Body Dressing: Supported sit to stand Toilet Transfer: Pharmacologist Method: Stand pivot Toileting - Clothing Manipulation and Hygiene: Simulated;Maximal assistance Where Assessed - Best boy and Hygiene: Sit to stand from 3-in-1 or toilet Equipment Used: Back brace;Rolling walker ADL Comments: Pt limited by 10/10 pain during bath and requesting to return to bed. States he is more comfortable in bed than chair right now. No family present but feel he will need SNF at d/c as he is so limited with ADL by his pain level.     OT Diagnosis: Generalized weakness;Acute pain  OT Problem List: Decreased strength;Decreased knowledge of use of DME or AE;Decreased knowledge of precautions;Pain OT Treatment Interventions: Self-care/ADL training;DME and/or AE instruction;Therapeutic activities;Patient/family education   OT Goals(Current goals can be found in the care plan section) Acute Rehab OT Goals Patient Stated Goal: decrease pain OT Goal Formulation: With patient Time For Goal Achievement: 10/22/13 Potential to Achieve Goals: Good  Visit Information  Last OT Received On: 10/08/13 History of Present Illness: 80yo male With  history of prior falls and vertebral plasty of L3 in the past presenting to the ED  after recent fall complaining of low back pain. He states that the back pain has been present all day every day for the last few weeks. The problem has persisted and he feels has gotten worse. He denies any bladder incontinence. Also denies any lower extremity weakness in one leg more than the other although he does report pain more in the right leg in the left. As mentioned the pain is persistent and not getting better as a result patient presented to the ED for further evaluation. He has had tramadol for help with pain and also another pain medicine described as a strong pain medicine although wife is unable to tell me what pain medicine that is. But she is having a hard time taking care of him at home and as such given that his pain has gotten worse decided to bring him in for evaluation.       Prior [facility] expects to be discharged to:: Skilled nursing facility Living Arrangements: Spouse/significant other Prior Function Level of Independence: Needs assistance ADL's / Homemaking Assistance Needed: pt reports he has a caregiver that comes in to help with bath/dress daily. pt is HOH so difficult to get more details. Communication Communication: HOH         Vision/Perception     Cognition  Cognition Arousal/Alertness: Awake/alert Behavior During Therapy: WFL for tasks assessed/performed Overall Cognitive Status: Within Functional Limits for tasks assessed    Extremity/Trunk Assessment Upper Extremity Assessment Upper Extremity Assessment: Generalized weakness     Mobility Bed Mobility Overal bed mobility: Needs Assistance Bed Mobility: Sit to Sidelying Rolling: Min assist Sit to sidelying: Min assist General bed mobility comments: verbal and tactile cues for log roll technique.  Transfers Overall transfer level: Needs assistance Equipment used: Rolling walker (2 wheeled) Transfers: Sit to/from Stand Sit to Stand: Min assist Stand pivot  transfers: Min assist General transfer comment: verbal cues for hand placement.     Exercise     Balance Balance Overall balance assessment: Needs assistance Standing balance-Leahy Scale: Poor   End of Session OT - End of Session Equipment Utilized During Treatment: Rolling walker;Back brace Activity Tolerance: Patient limited by pain Patient left: in bed;with call bell/phone within reach  GO     Jules Schick [phone] 10/08/2013, 12:01 PM

## 2013-10-09 DIAGNOSIS — G9340 Encephalopathy, unspecified: Secondary | ICD-10-CM

## 2013-10-09 DIAGNOSIS — N179 Acute kidney failure, unspecified: Secondary | ICD-10-CM

## 2013-10-09 LAB — GLUCOSE, CAPILLARY
GLUCOSE-CAPILLARY: 462 mg/dL — AB (ref 70–99)
Glucose-Capillary: 189 mg/dL — ABNORMAL HIGH (ref 70–99)
Glucose-Capillary: 439 mg/dL — ABNORMAL HIGH (ref 70–99)
Glucose-Capillary: 407 mg/dL — ABNORMAL HIGH (ref 70–99)
Glucose-Capillary: 319 mg/dL — ABNORMAL HIGH (ref 70–99)
Glucose-Capillary: 119 mg/dL — ABNORMAL HIGH (ref 70–99)

## 2013-10-09 LAB — BASIC METABOLIC PANEL WITH GFR
BUN: 25 mg/dL — ABNORMAL HIGH (ref 6–23)
CO2: 26 meq/L (ref 19–32)
Calcium: 8.4 mg/dL (ref 8.4–10.5)
Chloride: 91 meq/L — ABNORMAL LOW (ref 96–112)
Creatinine, Ser: 1.41 mg/dL — ABNORMAL HIGH (ref 0.50–1.35)
GFR calc Af Amer: 49 mL/min — ABNORMAL LOW
GFR calc non Af Amer: 42 mL/min — ABNORMAL LOW
Glucose, Bld: 470 mg/dL — ABNORMAL HIGH (ref 70–99)
Potassium: 4.1 meq/L (ref 3.7–5.3)
Sodium: 129 meq/L — ABNORMAL LOW (ref 137–147)

## 2013-10-09 LAB — CBC
HCT: 36.1 % — ABNORMAL LOW (ref 39.0–52.0)
Hemoglobin: 12.2 g/dL — ABNORMAL LOW (ref 13.0–17.0)
MCH: 32.5 pg (ref 26.0–34.0)
MCHC: 33.8 g/dL (ref 30.0–36.0)
MCV: 96.3 fL (ref 78.0–100.0)
Platelets: 114 10*3/uL — ABNORMAL LOW (ref 150–400)
RBC: 3.75 MIL/uL — ABNORMAL LOW (ref 4.22–5.81)
RDW: 13.8 % (ref 11.5–15.5)
WBC: 10.3 10*3/uL (ref 4.0–10.5)

## 2013-10-09 LAB — URINE CULTURE: Colony Count: 85000

## 2013-10-09 LAB — BASIC METABOLIC PANEL
BUN: 22 mg/dL (ref 6–23)
CO2: 23 mEq/L (ref 19–32)
Calcium: 8.5 mg/dL (ref 8.4–10.5)
Chloride: 96 mEq/L (ref 96–112)
Creatinine, Ser: 1.41 mg/dL — ABNORMAL HIGH (ref 0.50–1.35)
GFR, EST AFRICAN AMERICAN: 49 mL/min — AB (ref >90)
GFR, EST NON AFRICAN AMERICAN: 42 mL/min — AB (ref >90)
Glucose, Bld: 189 mg/dL — ABNORMAL HIGH (ref 70–99)
POTASSIUM: 3.9 meq/L (ref 3.7–5.3)
SODIUM: 134 meq/L — AB (ref 137–147)

## 2013-10-09 LAB — APTT: aPTT: 65 seconds — ABNORMAL HIGH (ref 24–37)

## 2013-10-09 LAB — PROTIME-INR
INR: 1.19 (ref 0.00–1.49)
PROTHROMBIN TIME: 14.8 s (ref 11.6–15.2)

## 2013-10-09 MED ORDER — HYDRALAZINE HCL 10 MG PO TABS
10.0000 mg | ORAL_TABLET | Freq: Three times a day (TID) | ORAL | Status: DC
  Administered 2013-10-09 – 2013-10-12 (×9): 10 mg via ORAL
  Filled 2013-10-09 (×12): qty 1

## 2013-10-09 MED ORDER — LEVOFLOXACIN IN D5W 500 MG/100ML IV SOLN
500.0000 mg | INTRAVENOUS | Status: DC
  Administered 2013-10-11: 13:00:00 500 mg via INTRAVENOUS
  Filled 2013-10-09: qty 100

## 2013-10-09 MED ORDER — INSULIN ASPART 100 UNIT/ML ~~LOC~~ SOLN
0.0000 [IU] | Freq: Three times a day (TID) | SUBCUTANEOUS | Status: DC
  Administered 2013-10-09: 17:00:00 15 [IU] via SUBCUTANEOUS
  Administered 2013-10-10 (×2): 5 [IU] via SUBCUTANEOUS
  Administered 2013-10-11 (×2): 3 [IU] via SUBCUTANEOUS
  Administered 2013-10-11: 18:00:00 5 [IU] via SUBCUTANEOUS

## 2013-10-09 MED ORDER — INSULIN ASPART 100 UNIT/ML ~~LOC~~ SOLN: 0.0000 [IU] | Freq: Every day | SUBCUTANEOUS | Status: DC

## 2013-10-09 MED ORDER — SODIUM CHLORIDE 0.9 % IV SOLN
INTRAVENOUS | Status: AC
Start: 2013-10-09 — End: 2013-10-10
  Administered 2013-10-09: 13:00:00 via INTRAVENOUS

## 2013-10-09 MED ORDER — GLUCERNA SHAKE PO LIQD
237.0000 mL | Freq: Three times a day (TID) | ORAL | Status: DC
  Administered 2013-10-09 – 2013-10-12 (×9): 237 mL via ORAL
  Filled 2013-10-09 (×11): qty 237

## 2013-10-09 MED ORDER — INSULIN ASPART 100 UNIT/ML ~~LOC~~ SOLN
4.0000 [IU] | Freq: Three times a day (TID) | SUBCUTANEOUS | Status: DC
  Administered 2013-10-09 – 2013-10-11 (×5): 4 [IU] via SUBCUTANEOUS

## 2013-10-09 NOTE — Progress Notes (Signed)
Bed offers given.  Pt hopeful for [facility]; [facility] has yet to respond.  CSW to contact [facility] on Monday to advocate for Pt's admission.  Bernita Raisin, [place] Work [phone]

## 2013-10-09 NOTE — Progress Notes (Signed)
Patient ID: Guy [name], male   DOB: 10-16-1919, 80 y.o.   MRN: [id number]  Request made for IR to evaluate pt for possible L1 KP/VP.  Will have Dr Estanislado Pandy review films asap.  These procedure are not performed on weekend due to decrease in staff. Will have films reviewed and keep note in chart.  Will keep MD informed.

## 2013-10-09 NOTE — Progress Notes (Signed)
Patient ID: Guy [name], male   DOB: 07-18-1920, 80 y.o.   MRN: [id number] [facility] PROGRESS NOTE  Guy [name]:[id number] DOB: 1920-05-07 DOA: 10/07/2013 PCP: Guy Patricia, MD  Brief narrative:  80 y.o. male history of prior falls and vertebroplasty of L3 in the past presenting to the ED after recent fall and with main concern of persistent and throbbing low back pain. Pain is occasionally radiating to lower extremities and is worse with ambulation, no specific alleviating factors. Pt denies numbness or tingling, no specific focal neurological symptoms. In ED CT of lumbar spine showed an L1 compression fracture. We were consulted for further evaluation and recommendations.   Principal Problem:  Compression fracture of L1 lumbar vertebra  - pt still in significant pain  - will continue to follow up on IR recommendations  - continue analgesia as needed and PT if pt able to tolerate  Active Problems:  DM (diabetes mellitus)  - CBG's above 300, will change SSI to moderate coverage and will add meal coverage as well - continue Lantus QHS  Essential hypertension, benign  - BP slightly elevated and possibly secondary to acute pain  - continue Metoprolol 50 mg PO QD for now  - will place on Hydralazine 10 mg TID for better BP control  Depression  - stable  - continue home medical regimen  GERD (gastroesophageal reflux disease)  - stable, continue PPI  Urinary incontinence - oxybutynin daily as per home medical regimen  HLD  - continue statin  Acute renal failure  - secondary to pre renal etiology  - placed on IVF, encouraged PO intake and will repeat BMP in AM Recent UTI  - with E. Coli and Klebsiella  - pt not sure if he was treated  - placed on Levaquin QD, day #2   Consultants:  Interventional Radiology  Procedures/Studies:  Dg Cervical Spine 2 Or 3 Views 10/07/2013 Significantly diminished exam detail. If there is a high clinical concern for cervical spine  fracture then a cervical spine CT should be obtained. Cervical spondylosis.  Ct Lumbar Spine Wo Contrast 10/07/2013 L1 superior endplate compression fracture with approximately 40% vertebral body height loss appears acute or subacute. There is no bony retropulsion and the fracture has an appearance most consistent with a senile osteoporotic injury. Remote compression fractures of T12, L3 and L4. Degenerative change appearing worst at L3-4 and L4-5 as described above. Negative for CT evidence of metastatic disease. Osteopenia.  Antibiotics:  Levaquin 01/09 -->  Code Status: Full  Family Communication: Pt at bedside, wife over the phone  Disposition Plan: Home when medically stable  HPI/Subjective: No events overnight.   Objective: Filed Vitals:   10/08/13 1404 10/08/13 1945 10/08/13 2145 10/09/13 0456  BP: 167/74  155/76 173/80  Pulse: 63  64 69  Temp: 98.6 F (37 C)  98.5 F (36.9 C) 98.4 F (36.9 C)  TempSrc:   Oral Oral  Resp: 18 18 18 18   Height:      Weight:      SpO2: 98%  99% 96%    Intake/Output Summary (Last 24 hours) at 10/09/13 0704 Last data filed at 10/09/13 0457  Gross per 24 hour  Intake    540 ml  Output    820 ml  Net   -280 ml    Exam:   General:  Pt is alert, follows commands appropriately, not in acute distress, HOH  Cardiovascular: Regular rate and rhythm, S1/S2, no murmurs, no rubs, no gallops  Respiratory: Clear to auscultation bilaterally, no wheezing, no crackles, no rhonchi  Abdomen: Soft, non tender, non distended, bowel sounds present, no guarding  Extremities: No edema, pulses DP and PT palpable bilaterally  Neuro: Grossly nonfocal  Data Reviewed: Basic Metabolic Panel:  Recent Labs Lab 10/07/13 1233 10/07/13 1630 10/08/13 0526 10/09/13 0448  NA 139  --  136* 134*  K 3.9  --  4.2 3.9  CL 97  --  97 96  CO2  --   --  27 23  GLUCOSE 190*  --  116* 189*  BUN 25*  --  20 22  CREATININE 1.40* 1.24 1.30 1.41*  CALCIUM  --   --   9.2 8.5  MG  --  1.7  --   --   PHOS  --  3.3  --   --    CBC:  Recent Labs Lab 10/07/13 1220 10/07/13 1233 10/07/13 1630 10/08/13 0526 10/09/13 0448  WBC 11.6*  --  10.1 10.0 10.3  NEUTROABS 9.5*  --   --   --   --   HGB 13.4 14.6 12.4* 12.1* 12.2*  HCT 39.0 43.0 36.6* 34.9* 36.1*  MCV 96.3  --  95.8 95.1 96.3  PLT 123*  --  122* 115* 114*   CBG:  Recent Labs Lab 10/07/13 2130 10/08/13 0734 10/08/13 1211 10/08/13 1658 10/08/13 2142  GLUCAP 160* 115* 167* 214* 181*    Recent Results (from the past 240 hour(s))  URINE CULTURE     Status: None   Collection Time    09/30/13  4:42 PM      Result Value Range Status   Specimen Description URINE, CATHETERIZED   Final   Special Requests NONE   Final   Culture  Setup Time     Final   Value: 10/01/2013 01:25     Performed at [place]     Final   Value: >=100,000 COLONIES/ML     Performed at [facility]   Culture     Final   Value: KLEBSIELLA PNEUMONIAE     ESCHERICHIA COLI     Performed at [facility]   Report Status 10/04/2013 FINAL   Final   Organism ID, Bacteria KLEBSIELLA PNEUMONIAE   Final   Organism ID, Bacteria ESCHERICHIA COLI   Final  MRSA PCR SCREENING     Status: Abnormal   Collection Time    10/07/13  3:41 PM      Result Value Range Status   MRSA by PCR POSITIVE (*) NEGATIVE Final   Comment:            The GeneXpert MRSA Assay (FDA     approved for NASAL specimens     only), is one component of a     comprehensive MRSA colonization     surveillance program. It is not     intended to diagnose MRSA     infection nor to guide or     monitor treatment for     MRSA infections.     RESULT CALLED TO, READ BACK BY AND VERIFIED WITH:     Guy [name] [id number] @ [id number] BY Guy [name]     Scheduled Meds: . bicalutamide  50 mg Oral Daily  . docusate sodium  200 mg Oral Daily  . feeding supplement (ENSURE COMPLETE)  237 mL Oral TID BM  . fluticasone  2 spray Each  Nare BID  . heparin  5,000  Units Subcutaneous Q8H  . insulin aspart  0-9 Units Subcutaneous TID WC  . insulin glargine  16 Units Subcutaneous QHS  . levofloxacin (LEVAQUIN) IV  500 mg Intravenous Daily  . lipase/protease/amylase  4 capsule Oral TID  . metoprolol  50 mg Oral Daily  . oxybutynin  5 mg Oral QHS  . pantoprazole  40 mg Oral Daily  . sertraline  100 mg Oral Daily  . simvastatin  40 mg Oral QHS   Continuous Infusions:  Faye Ramsay, MD  [facility] Pager [phone]  If 7PM-7AM, please contact night-coverage [email or web address] Password [email or web address] 10/09/2013, 7:04 AM   LOS: 2 days

## 2013-10-09 NOTE — Progress Notes (Signed)
Pt CBG 462...Dr. Doyle Askew paged and new orders were placed including a lab recheck to verify blood sugar.  Recheck from lab was 470.Marland KitchenMarland KitchenDr. Doyle Askew was paged with results and placed new orders.  No new orders were put in to cover lunch time CBG.  Will page Dr. Doyle Askew for further orders.

## 2013-10-10 LAB — BASIC METABOLIC PANEL
BUN: 27 mg/dL — AB (ref 6–23)
CALCIUM: 8.6 mg/dL (ref 8.4–10.5)
CO2: 28 mEq/L (ref 19–32)
CREATININE: 1.53 mg/dL — AB (ref 0.50–1.35)
Chloride: 99 mEq/L (ref 96–112)
GFR calc non Af Amer: 38 mL/min — ABNORMAL LOW (ref >90)
GFR, EST AFRICAN AMERICAN: 44 mL/min — AB (ref >90)
GLUCOSE: 210 mg/dL — AB (ref 70–99)
Potassium: 4.4 mEq/L (ref 3.7–5.3)
Sodium: 136 mEq/L — ABNORMAL LOW (ref 137–147)

## 2013-10-10 LAB — CBC
HCT: 35.6 % — ABNORMAL LOW (ref 39.0–52.0)
Hemoglobin: 12.3 g/dL — ABNORMAL LOW (ref 13.0–17.0)
MCH: 33 pg (ref 26.0–34.0)
MCHC: 34.6 g/dL (ref 30.0–36.0)
MCV: 95.4 fL (ref 78.0–100.0)
Platelets: 131 10*3/uL — ABNORMAL LOW (ref 150–400)
RBC: 3.73 MIL/uL — ABNORMAL LOW (ref 4.22–5.81)
RDW: 13.9 % (ref 11.5–15.5)
WBC: 9.1 10*3/uL (ref 4.0–10.5)

## 2013-10-10 LAB — GLUCOSE, CAPILLARY
GLUCOSE-CAPILLARY: 222 mg/dL — AB (ref 70–99)
GLUCOSE-CAPILLARY: 77 mg/dL (ref 70–99)
Glucose-Capillary: 215 mg/dL — ABNORMAL HIGH (ref 70–99)
Glucose-Capillary: 75 mg/dL (ref 70–99)
Glucose-Capillary: 102 mg/dL — ABNORMAL HIGH (ref 70–99)

## 2013-10-10 NOTE — Progress Notes (Signed)
Paged [place]den [place] NP. Made her aware of the Pt's leaking FC which is do to be replaced soon.  The original  [facility] was placed by Urology prior to admission.No orders given at this time, Stanton Kidney NP will write a note to the rounding MD. Will monitor.

## 2013-10-10 NOTE — Progress Notes (Signed)
Patient ID: Guy [name], male   DOB: May 06, 1920, 80 y.o.   MRN: [id number]  [facility] PROGRESS NOTE  IMMANUEL FEDAK GYI:[id number] DOB: 08/01/1920 DOA: 10/07/2013 PCP: [name], MD  Brief narrative:  80 y.o. male history of prior falls and vertebroplasty of L3 in the past presenting to the ED after recent fall and with main concern of persistent and throbbing low back pain. Pain is occasionally radiating to lower extremities and is worse with ambulation, no specific alleviating factors. Pt denies numbness or tingling, no specific focal neurological symptoms. In ED CT of lumbar spine showed an L1 compression fracture. We were consulted for further evaluation and recommendations.   Principal Problem:  Compression fracture of L1 lumbar vertebra  - pt still in significant pain  - will continue to follow up on IR recommendations  - continue analgesia as needed and PT if pt able to tolerate  Active Problems:  DM (diabetes mellitus)  - CBG's above 300, changed SSI to moderate coverage with meal coverage as well  - continue Lantus QHS  Essential hypertension, benign  - BP slightly elevated and possibly secondary to acute pain  - improved over the past 24 hours  - continue Metoprolol 50 mg PO QD and Hydralazine 10 mg TID PO Depression  - stable  - continue home medical regimen  GERD (gastroesophageal reflux disease)  - stable, continue PPI  Urinary incontinence - oxybutynin daily as per home medical regimen  HLD  - continue statin  Acute renal failure  - secondary to pre renal etiology  - avoid nephrotoxins - repeat BMP in AM Recent UTI  - with E. Coli and Klebsiella  - pt not sure if he was treated  - placed on Levaquin QD, day #3  Consultants:  Interventional Radiology  Procedures/Studies:  Dg Cervical Spine 2 Or 3 Views 10/07/2013 Significantly diminished exam detail. If there is a high clinical concern for cervical spine fracture then a cervical spine CT  should be obtained. Cervical spondylosis.  Ct Lumbar Spine Wo Contrast 10/07/2013 L1 superior endplate compression fracture with approximately 40% vertebral body height loss appears acute or subacute. There is no bony retropulsion and the fracture has an appearance most consistent with a senile osteoporotic injury. Remote compression fractures of T12, L3 and L4. Degenerative change appearing worst at L3-4 and L4-5 as described above. Negative for CT evidence of metastatic disease. Osteopenia.  Antibiotics:  Levaquin 01/09 -->  Code Status: Full  Family Communication: Pt at bedside, wife over the phone  Disposition Plan: SNF, [facility]     HPI/Subjective: No events overnight.   Objective: Filed Vitals:   10/09/13 0456 10/09/13 1410 10/09/13 2105 10/10/13 0530  BP: 173/80 182/83 150/71 150/71  Pulse: 69 63 68 68  Temp: 98.4 F (36.9 C) 97.6 F (36.4 C) 99.1 F (37.3 C) 99.1 F (37.3 C)  TempSrc: Oral Oral Oral Oral  Resp: 18 18 20 20   Height:      Weight:      SpO2: 96% 98% 94% 94%    Intake/Output Summary (Last 24 hours) at 10/10/13 1118 Last data filed at 10/10/13 0530  Gross per 24 hour  Intake    360 ml  Output    702 ml  Net   -342 ml    Exam:   General:  Pt is alert, follows commands appropriately, not in acute distress, HOH  Cardiovascular: Regular rate and rhythm, S1/S2, no murmurs, no rubs, no gallops  Respiratory: Clear to  auscultation bilaterally, no wheezing, no crackles, no rhonchi  Abdomen: Soft, non tender, non distended, bowel sounds present, no guarding  Extremities: No edema, pulses DP and PT palpable bilaterally  Neuro: Grossly nonfocal  Data Reviewed: Basic Metabolic Panel:  Recent Labs Lab 10/07/13 1233 10/07/13 1630 10/08/13 0526 10/09/13 0448 10/09/13 1318 10/10/13 0500  NA 139  --  136* 134* 129* 136*  K 3.9  --  4.2 3.9 4.1 4.4  CL 97  --  97 96 91* 99  CO2  --   --  27 23 26 28   GLUCOSE 190*  --  116* 189* 470* 210*  BUN  25*  --  20 22 25* 27*  CREATININE 1.40* 1.24 1.30 1.41* 1.41* 1.53*  CALCIUM  --   --  9.2 8.5 8.4 8.6  MG  --  1.7  --   --   --   --   PHOS  --  3.3  --   --   --   --    CBC:  Recent Labs Lab 10/07/13 1220 10/07/13 1233 10/07/13 1630 10/08/13 0526 10/09/13 0448 10/10/13 0500  WBC 11.6*  --  10.1 10.0 10.3 9.1  NEUTROABS 9.5*  --   --   --   --   --   HGB 13.4 14.6 12.4* 12.1* 12.2* 12.3*  HCT 39.0 43.0 36.6* 34.9* 36.1* 35.6*  MCV 96.3  --  95.8 95.1 96.3 95.4  PLT 123*  --  122* 115* 114* 131*   CBG:  Recent Labs Lab 10/09/13 1217 10/09/13 1524 10/09/13 1707 10/09/13 2211 10/10/13 0712  GLUCAP 462* 407* 319* 119* 215*    Recent Results (from the past 240 hour(s))  URINE CULTURE     Status: None   Collection Time    09/30/13  4:42 PM      Result Value Range Status   Specimen Description URINE, CATHETERIZED   Final   Special Requests NONE   Final   Culture  Setup Time     Final   Value: 10/01/2013 01:25     Performed at [place]     Final   Value: >=100,000 COLONIES/ML     Performed at [facility]   Culture     Final   Value: KLEBSIELLA PNEUMONIAE     ESCHERICHIA COLI     Performed at [facility]   Report Status 10/04/2013 FINAL   Final   Organism ID, Bacteria KLEBSIELLA PNEUMONIAE   Final   Organism ID, Bacteria ESCHERICHIA COLI   Final  MRSA PCR SCREENING     Status: Abnormal   Collection Time    10/07/13  3:41 PM      Result Value Range Status   MRSA by PCR POSITIVE (*) NEGATIVE Final   Comment:            The GeneXpert MRSA Assay (FDA     approved for NASAL specimens     only), is one component of a     comprehensive MRSA colonization     surveillance program. It is not     intended to diagnose MRSA     infection nor to guide or     monitor treatment for     MRSA infections.     RESULT CALLED TO, READ BACK BY AND VERIFIED WITH:     Servando Salina [id number] @ [id number] BY J SCOTTON  URINE CULTURE      Status: None  Collection Time    10/08/13 11:23 AM      Result Value Range Status   Specimen Description URINE, CATHETERIZED   Final   Special Requests NONE   Final   Culture  Setup Time     Final   Value: 10/08/2013 15:21     Performed at [place]     Final   Value: 85,000 COLONIES/ML     Performed at [facility]   Culture     Final   Value: Multiple bacterial morphotypes present, none predominant. Suggest appropriate recollection if clinically indicated.     Performed at [facility]   Report Status 10/09/2013 FINAL   Final     Scheduled Meds: . bicalutamide  50 mg Oral Daily  . docusate sodium  200 mg Oral Daily  . feeding supplement (GLUCERNA SHAKE)  237 mL Oral TID BM  . fluticasone  2 spray Each Nare BID  . hydrALAZINE  10 mg Oral Q8H  . insulin aspart  0-15 Units Subcutaneous TID WC  . insulin aspart  0-5 Units Subcutaneous QHS  . insulin aspart  4 Units Subcutaneous TID WC  . insulin glargine  16 Units Subcutaneous QHS  . [START ON 10/11/2013] levofloxacin (LEVAQUIN) IV  500 mg Intravenous Q48H  . lipase/protease/amylase  4 capsule Oral TID  . metoprolol  50 mg Oral Daily  . oxybutynin  5 mg Oral QHS  . pantoprazole  40 mg Oral Daily  . sertraline  100 mg Oral Daily  . simvastatin  40 mg Oral QHS   Continuous Infusions:    Faye Ramsay, MD  [facility] Pager [phone]  If 7PM-7AM, please contact night-coverage [email or web address] Password [email or web address] 10/10/2013, 11:18 AM   LOS: 3 days

## 2013-10-10 NOTE — Progress Notes (Signed)
Paged Dr. Doyle Askew... Made her aware of the Pt's leaking FC which is due to be replaced on 10/18/13 per his wife at his urologist office. The original [facility] was placed by Urology prior to admission.  No orders given at this time, but Dr. Doyle Askew did state had already paged urology to see pt and would place another page to urology about the situation. Informed wife of conversation with Dr. Doyle Askew.  Will continue to monitor.

## 2013-10-11 ENCOUNTER — Ambulatory Visit: Payer: Medicare Other | Admitting: Physical Therapy

## 2013-10-11 LAB — CBC
HEMATOCRIT: 35.4 % — AB (ref 39.0–52.0)
Hemoglobin: 11.9 g/dL — ABNORMAL LOW (ref 13.0–17.0)
MCH: 32.5 pg (ref 26.0–34.0)
MCHC: 33.6 g/dL (ref 30.0–36.0)
MCV: 96.7 fL (ref 78.0–100.0)
Platelets: 139 10*3/uL — ABNORMAL LOW (ref 150–400)
RBC: 3.66 MIL/uL — AB (ref 4.22–5.81)
RDW: 14.1 % (ref 11.5–15.5)
WBC: 8.4 10*3/uL (ref 4.0–10.5)

## 2013-10-11 LAB — GLUCOSE, CAPILLARY
GLUCOSE-CAPILLARY: 167 mg/dL — AB (ref 70–99)
Glucose-Capillary: 230 mg/dL — ABNORMAL HIGH (ref 70–99)
Glucose-Capillary: 259 mg/dL — ABNORMAL HIGH (ref 70–99)
Glucose-Capillary: 76 mg/dL (ref 70–99)

## 2013-10-11 LAB — BASIC METABOLIC PANEL
BUN: 24 mg/dL — AB (ref 6–23)
CO2: 23 meq/L (ref 19–32)
CREATININE: 1.43 mg/dL — AB (ref 0.50–1.35)
Calcium: 8.2 mg/dL — ABNORMAL LOW (ref 8.4–10.5)
Chloride: 98 mEq/L (ref 96–112)
GFR calc non Af Amer: 41 mL/min — ABNORMAL LOW (ref >90)
GFR, EST AFRICAN AMERICAN: 48 mL/min — AB (ref >90)
Glucose, Bld: 102 mg/dL — ABNORMAL HIGH (ref 70–99)
Potassium: 3.8 mEq/L (ref 3.7–5.3)
Sodium: 134 mEq/L — ABNORMAL LOW (ref 137–147)

## 2013-10-11 MED ORDER — CHLORHEXIDINE GLUCONATE CLOTH 2 % EX PADS
6.0000 | MEDICATED_PAD | Freq: Every day | CUTANEOUS | Status: DC
Start: 2013-10-12 — End: 2013-10-12
  Administered 2013-10-12: 6 via TOPICAL

## 2013-10-11 MED ORDER — MUPIROCIN 2 % EX OINT
1.0000 "application " | TOPICAL_OINTMENT | Freq: Two times a day (BID) | CUTANEOUS | Status: DC
  Administered 2013-10-12: 1 via NASAL
  Filled 2013-10-11: qty 22

## 2013-10-11 NOTE — Progress Notes (Signed)
Spoke to nurse and pt's wife Back OR tomorrow NO prosthesis/AUS - removed at [place] around cath likely from UTI or bladder spasms Change catheter and send urine for c/s Treat pos c/s

## 2013-10-11 NOTE — Progress Notes (Signed)
Patient ID: Guy [name], male   DOB: Aug 06, 1920, 80 y.o.   MRN: [id number] Request received for L1 VP/KP in pt with hx back pain/recent fall, prostate/bladder carcinoma, prior L3 KP 10/2012 and finding of new acute/subacute L1 compression fracture on recent CT. Additional PMH as below. Imaging studies were reviewed by Dr. Estanislado Pandy and he feels pt is candidate for above procedure. Pt has been treated for recent UTI. Exam: Pt awake/alert; HOH; chest- CTA bilat; heart- RRR; abd- soft,+BS,NT; ext- FROM, no edema; paravertebral tenderness present at L1 region with some radiation to rt flank.    Filed Vitals:   10/09/13 2105 10/10/13 0530 10/10/13 1419 10/11/13 0427  BP: 150/71 150/71 147/79 162/75  Pulse: 68 68 60 68  Temp: 99.1 F (37.3 C) 99.1 F (37.3 C) 98.1 F (36.7 C) 98.3 F (36.8 C)  TempSrc: Oral Oral Oral Oral  Resp: 20 20 18 20   Height:      Weight:      SpO2: 94% 94% 96% 96%   Past Medical History  Diagnosis Date  . Hypertension   . Diabetes mellitus without complication   . Arthritis   . Sciatica   . GERD (gastroesophageal reflux disease)   . Urge incontinence   . Vertigo   . Hyperlipidemia   . ED (erectile dysfunction)   . Gastric polyposis   . Osteoporosis   . Decreased libido   . Depression   . Hepatitis A   . Stroke   . History of blood transfusion     1946  . OA (osteoarthritis)   . Bladder cancer   . Prostate cancer     S/P prostatectomy; Lung metastasis  . CAD (coronary artery disease) 1996    stent to the LAD   . Hyperlipidemia   . Type 2 diabetes mellitus    Past Surgical History  Procedure Laterality Date  . Carpal tunnel release    . Cataract extraction w/ intraocular lens  implant, bilateral  1998  . Prostatectomy    . Urinary sphincter implant    . Urinary sphincter implant revision    . Appendectomy    . Left hip surgery      Donated bone for bone graft to arm  . Penile prosthesis implant      S/P removal and re-implantation of new  prosthesis  . Middle ear surgery    . Finger surgery      Left and right  . Hernia repair    . Bladder surgery    . Right arm bone graft      Pathological fracture  . Cardiac catheterization  11/27/2001    patent stent with mild in-stent restenosis.  . Myoview  06/12/2009    Persantine myoview EF 76%; Normal myoview  . Coronary angioplasty  1996    stenting to the LAD in [place].    Dg Chest 2 View  09/30/2013   CLINICAL DATA:  Chest and back pain.  Hypertension and diabetes.  EXAM: CHEST  2 VIEW  COMPARISON:  03/20/2013  FINDINGS: Heart size is normal. No evidence pulmonary airspace disease or edema. No evidence of pleural effusion.  A irregular nodular density is now seen in the left upper lobe, and a small bronchogenic carcinoma cannot be excluded.  Old right humeral fracture deformity again noted.  IMPRESSION: Left upper lobe nodular density. Recommend chest CT without contrast to exclude bronchogenic carcinoma.   Electronically Signed   By: Earle Gell M.D.   On: 09/30/2013  16:54   Dg Ribs Unilateral Right  09/30/2013   CLINICAL DATA:  Recent fall with right rib pain.  EXAM: RIGHT RIBS - 2 VIEW  COMPARISON:  Chest radiograph 09/30/2013 and chest radiograph 03/20/2013  FINDINGS: Chronic cortical irregularity and thickening in the proximal right humerus. Negative for a right pneumothorax. There are old fractures in the right lower chest that appear to be involving the right 8th and 9th ribs. Difficult to exclude a new fracture involving the right 7th rib.  IMPRESSION: Old right rib fractures and cannot exclude a new fracture in the region of the right 7th rib.   Electronically Signed   By: Markus Daft M.D.   On: 09/30/2013 16:58   Dg Cervical Spine 2 Or 3 Views  10/07/2013   CLINICAL DATA:  Fall  EXAM: CERVICAL SPINE - 2-3 VIEW  COMPARISON:  None.  FINDINGS: Exam detail is diminished due to overlying bony and soft tissue structures. The C1 through C6 vertebra are only visualized. There is no  evidence for cervical spine fracture. No dislocation. Multi level disc space narrowing and ventral endplate spurring is noted. This is most severe at C3-4 and C4-5.  IMPRESSION: Significantly diminished exam detail. If there is a high clinical concern for cervical spine fracture then a cervical spine CT should be obtained.  Cervical spondylosis.   Electronically Signed   By: Kerby Moors M.D.   On: 10/07/2013 17:38   Dg Lumbar Spine Complete  09/30/2013   CLINICAL DATA:  Fall and back pain.  EXAM: LUMBAR SPINE - COMPLETE 4+ VIEW  COMPARISON:  MRI 05/01/2013 and 11/18/2012  FINDINGS: Rightward scoliosis of the lumbar spine. Known compression fracture at L3 and the patient has undergone vertebral augmentation procedure at this level. There is not clear evidence for a new compression fracture. Difficult to evaluate the L4 vertebral body. Diffuse osteopenia of the bones. Multiple surgical clips in the pelvis.  IMPRESSION: Scoliosis and old compression fracture at L3. No clear evidence for an acute fracture. If there is concern for an occult compression fracture, consider further evaluation with MRI or CT.   Electronically Signed   By: Markus Daft M.D.   On: 09/30/2013 16:51   Ct Lumbar Spine Wo Contrast  10/07/2013   CLINICAL DATA:  Status post fall 9 days ago. Low back pain radiating into the right leg. History of bladder cancer and metastatic prostate cancer.  EXAM: CT LUMBAR SPINE WITHOUT CONTRAST  TECHNIQUE: Multidetector CT imaging of the lumbar spine was performed without intravenous contrast administration. Multiplanar CT image reconstructions were also generated.  COMPARISON:  Plain films lumbar spine 09/30/2013. MRI lumbar spine 11/19/2012.  FINDINGS: Bones appear osteopenic. The patient has a compression fracture deformity of L1 with approximately 40% vertebral body height loss centrally. There is no extension into the posterior elements and no bony retropulsion. The fracture appears acute or subacute  with fracture lines visible and sharp fracture margins identified. There is a minimal superior endplate compression fracture of T12. Mild biconcave deformity of L4 is remote. Old L3 compression fracture is identified with postoperative change of vertebral augmentation noted.  The patient has convex right scoliosis. No lytic or sclerotic bony lesion is identified. Visualized sacrum and posterior iliac wings appear intact. Imaged intra-abdominal contents demonstrate aortic atherosclerosis.  T11-12: Minimal disc bulge without central canal or foraminal narrowing.  T12-L1: Shallow, calcified left paracentral protrusion without central canal or foraminal stenosis.  L1-2: Shallow disc bulge with some ligamentum flavum thickening and mild facet  arthropathy. Central canal and foramina appear open.  L3-4: Ligamentum flavum thickening and a shallow disc bulge cause mild to moderate central canal narrowing. Foramina appear open.  L3-4: There is prominent ligamentum flavum thickening and a broad-based disc bulge. Moderately severe to severe central canal stenosis is present. The foramina are narrowed bilaterally, worse on the left.  L4-5: Bulky ligamentum flavum thickening and a shallow disc bulge result in severe central canal stenosis. There is right worse than left foraminal narrowing.  L5-S1:  Negative.  IMPRESSION: L1 superior endplate compression fracture with approximately 40% vertebral body height loss appears acute or subacute. There is no bony retropulsion and the fracture has an appearance most consistent with a senile osteoporotic injury.  Remote compression fractures of T12, L3 and L4.  Degenerative change appearing worst at L3-4 and L4-5 as described above.  Negative for CT evidence of metastatic disease.  Osteopenia.   Electronically Signed   By: Inge Rise M.D.   On: 10/07/2013 13:06  Results for orders placed during the hospital encounter of 10/07/13  MRSA PCR SCREENING      Result Value Range   MRSA  by PCR POSITIVE (*) NEGATIVE  URINE CULTURE      Result Value Range   Specimen Description URINE, CATHETERIZED     Special Requests NONE     Culture  Setup Time       Value: 10/08/2013 15:21     Performed at [facility]       Value: 85,000 COLONIES/ML     Performed at [facility]   Culture       Value: Multiple bacterial morphotypes present, none predominant. Suggest appropriate recollection if clinically indicated.     Performed at [facility]   Report Status 10/09/2013 FINAL    CBC WITH DIFFERENTIAL      Result Value Range   WBC 11.6 (*) 4.0 - 10.5 K/uL   RBC 4.05 (*) 4.22 - 5.81 MIL/uL   Hemoglobin 13.4  13.0 - 17.0 g/dL   HCT 39.0  39.0 - 52.0 %   MCV 96.3  78.0 - 100.0 fL   MCH 33.1  26.0 - 34.0 pg   MCHC 34.4  30.0 - 36.0 g/dL   RDW 13.9  11.5 - 15.5 %   Platelets 123 (*) 150 - 400 K/uL   Neutrophils Relative % 82 (*) 43 - 77 %   Neutro Abs 9.5 (*) 1.7 - 7.7 K/uL   Lymphocytes Relative 7 (*) 12 - 46 %   Lymphs Abs 0.8  0.7 - 4.0 K/uL   Monocytes Relative 9  3 - 12 %   Monocytes Absolute 1.1 (*) 0.1 - 1.0 K/uL   Eosinophils Relative 2  0 - 5 %   Eosinophils Absolute 0.2  0.0 - 0.7 K/uL   Basophils Relative 0  0 - 1 %   Basophils Absolute 0.0  0.0 - 0.1 K/uL  URINALYSIS, ROUTINE W REFLEX MICROSCOPIC      Result Value Range   Color, Urine YELLOW  YELLOW   APPearance TURBID (*) CLEAR   Specific Gravity, Urine 1.016  1.005 - 1.030   pH 6.5  5.0 - 8.0   Glucose, UA NEGATIVE  NEGATIVE mg/dL   Hgb urine dipstick MODERATE (*) NEGATIVE   Bilirubin Urine NEGATIVE  NEGATIVE   Ketones, ur NEGATIVE  NEGATIVE mg/dL   Protein, ur 100 (*) NEGATIVE mg/dL   Urobilinogen, UA 1.0  0.0 - 1.0  mg/dL   Nitrite POSITIVE (*) NEGATIVE   Leukocytes, UA LARGE (*) NEGATIVE  URINE MICROSCOPIC-ADD ON      Result Value Range   Squamous Epithelial / LPF RARE  RARE   WBC, UA 21-50  <3 WBC/hpf   RBC / HPF 3-6  <3 RBC/hpf   Bacteria, UA RARE  RARE    Urine-Other MANY YEAST    CBC      Result Value Range   WBC 10.1  4.0 - 10.5 K/uL   RBC 3.82 (*) 4.22 - 5.81 MIL/uL   Hemoglobin 12.4 (*) 13.0 - 17.0 g/dL   HCT 36.6 (*) 39.0 - 52.0 %   MCV 95.8  78.0 - 100.0 fL   MCH 32.5  26.0 - 34.0 pg   MCHC 33.9  30.0 - 36.0 g/dL   RDW 14.0  11.5 - 15.5 %   Platelets 122 (*) 150 - 400 K/uL  CREATININE, SERUM      Result Value Range   Creatinine, Ser 1.24  0.50 - 1.35 mg/dL   GFR calc non Af Amer 49 (*) >90 mL/min   GFR calc Af Amer 57 (*) >90 mL/min  TSH      Result Value Range   TSH 1.436  0.350 - 4.500 uIU/mL  MAGNESIUM      Result Value Range   Magnesium 1.7  1.5 - 2.5 mg/dL  PHOSPHORUS      Result Value Range   Phosphorus 3.3  2.3 - 4.6 mg/dL  BASIC METABOLIC PANEL      Result Value Range   Sodium 136 (*) 137 - 147 mEq/L   Potassium 4.2  3.7 - 5.3 mEq/L   Chloride 97  96 - 112 mEq/L   CO2 27  19 - 32 mEq/L   Glucose, Bld 116 (*) 70 - 99 mg/dL   BUN 20  6 - 23 mg/dL   Creatinine, Ser 1.30  0.50 - 1.35 mg/dL   Calcium 9.2  8.4 - 10.5 mg/dL   GFR calc non Af Amer 46 (*) >90 mL/min   GFR calc Af Amer 54 (*) >90 mL/min  CBC      Result Value Range   WBC 10.0  4.0 - 10.5 K/uL   RBC 3.67 (*) 4.22 - 5.81 MIL/uL   Hemoglobin 12.1 (*) 13.0 - 17.0 g/dL   HCT 34.9 (*) 39.0 - 52.0 %   MCV 95.1  78.0 - 100.0 fL   MCH 33.0  26.0 - 34.0 pg   MCHC 34.7  30.0 - 36.0 g/dL   RDW 13.8  11.5 - 15.5 %   Platelets 115 (*) 150 - 400 K/uL  GLUCOSE, CAPILLARY      Result Value Range   Glucose-Capillary 202 (*) 70 - 99 mg/dL   Comment 1 Notify RN     Comment 2 Documented in Chart    GLUCOSE, CAPILLARY      Result Value Range   Glucose-Capillary 160 (*) 70 - 99 mg/dL  GLUCOSE, CAPILLARY      Result Value Range   Glucose-Capillary 115 (*) 70 - 99 mg/dL   Comment 1 Notify RN     Comment 2 Documented in Chart    GLUCOSE, CAPILLARY      Result Value Range   Glucose-Capillary 167 (*) 70 - 99 mg/dL   Comment 1 Notify RN     Comment 2  Documented in Chart    GLUCOSE, CAPILLARY      Result Value Range  Glucose-Capillary 214 (*) 70 - 99 mg/dL   Comment 1 Notify RN     Comment 2 Documented in Chart    CBC      Result Value Range   WBC 10.3  4.0 - 10.5 K/uL   RBC 3.75 (*) 4.22 - 5.81 MIL/uL   Hemoglobin 12.2 (*) 13.0 - 17.0 g/dL   HCT 36.1 (*) 39.0 - 52.0 %   MCV 96.3  78.0 - 100.0 fL   MCH 32.5  26.0 - 34.0 pg   MCHC 33.8  30.0 - 36.0 g/dL   RDW 13.8  11.5 - 15.5 %   Platelets 114 (*) 150 - 400 K/uL  BASIC METABOLIC PANEL      Result Value Range   Sodium 134 (*) 137 - 147 mEq/L   Potassium 3.9  3.7 - 5.3 mEq/L   Chloride 96  96 - 112 mEq/L   CO2 23  19 - 32 mEq/L   Glucose, Bld 189 (*) 70 - 99 mg/dL   BUN 22  6 - 23 mg/dL   Creatinine, Ser 1.41 (*) 0.50 - 1.35 mg/dL   Calcium 8.5  8.4 - 10.5 mg/dL   GFR calc non Af Amer 42 (*) >90 mL/min   GFR calc Af Amer 49 (*) >90 mL/min  GLUCOSE, CAPILLARY      Result Value Range   Glucose-Capillary 181 (*) 70 - 99 mg/dL  GLUCOSE, CAPILLARY      Result Value Range   Glucose-Capillary 189 (*) 70 - 99 mg/dL  PROTIME-INR      Result Value Range   Prothrombin Time 14.8  11.6 - 15.2 seconds   INR 1.19  0.00 - 1.49  APTT      Result Value Range   aPTT 65 (*) 24 - 37 seconds  GLUCOSE, CAPILLARY      Result Value Range   Glucose-Capillary 439 (*) 70 - 99 mg/dL  GLUCOSE, CAPILLARY      Result Value Range   Glucose-Capillary 462 (*) 70 - 99 mg/dL  BASIC METABOLIC PANEL      Result Value Range   Sodium 129 (*) 137 - 147 mEq/L   Potassium 4.1  3.7 - 5.3 mEq/L   Chloride 91 (*) 96 - 112 mEq/L   CO2 26  19 - 32 mEq/L   Glucose, Bld 470 (*) 70 - 99 mg/dL   BUN 25 (*) 6 - 23 mg/dL   Creatinine, Ser 1.41 (*) 0.50 - 1.35 mg/dL   Calcium 8.4  8.4 - 10.5 mg/dL   GFR calc non Af Amer 42 (*) >90 mL/min   GFR calc Af Amer 49 (*) >90 mL/min  GLUCOSE, CAPILLARY      Result Value Range   Glucose-Capillary 407 (*) 70 - 99 mg/dL   Comment 1 Documented in Chart     Comment 2  Notify RN    CBC      Result Value Range   WBC 9.1  4.0 - 10.5 K/uL   RBC 3.73 (*) 4.22 - 5.81 MIL/uL   Hemoglobin 12.3 (*) 13.0 - 17.0 g/dL   HCT 35.6 (*) 39.0 - 52.0 %   MCV 95.4  78.0 - 100.0 fL   MCH 33.0  26.0 - 34.0 pg   MCHC 34.6  30.0 - 36.0 g/dL   RDW 13.9  11.5 - 15.5 %   Platelets 131 (*) 150 - 400 K/uL  BASIC METABOLIC PANEL      Result Value Range   Sodium  136 (*) 137 - 147 mEq/L   Potassium 4.4  3.7 - 5.3 mEq/L   Chloride 99  96 - 112 mEq/L   CO2 28  19 - 32 mEq/L   Glucose, Bld 210 (*) 70 - 99 mg/dL   BUN 27 (*) 6 - 23 mg/dL   Creatinine, Ser 1.53 (*) 0.50 - 1.35 mg/dL   Calcium 8.6  8.4 - 10.5 mg/dL   GFR calc non Af Amer 38 (*) >90 mL/min   GFR calc Af Amer 44 (*) >90 mL/min  GLUCOSE, CAPILLARY      Result Value Range   Glucose-Capillary 319 (*) 70 - 99 mg/dL  GLUCOSE, CAPILLARY      Result Value Range   Glucose-Capillary 119 (*) 70 - 99 mg/dL   Comment 1 Documented in Chart     Comment 2 Notify RN    GLUCOSE, CAPILLARY      Result Value Range   Glucose-Capillary 215 (*) 70 - 99 mg/dL  GLUCOSE, CAPILLARY      Result Value Range   Glucose-Capillary 222 (*) 70 - 99 mg/dL  CBC      Result Value Range   WBC 8.4  4.0 - 10.5 K/uL   RBC 3.66 (*) 4.22 - 5.81 MIL/uL   Hemoglobin 11.9 (*) 13.0 - 17.0 g/dL   HCT 35.4 (*) 39.0 - 52.0 %   MCV 96.7  78.0 - 100.0 fL   MCH 32.5  26.0 - 34.0 pg   MCHC 33.6  30.0 - 36.0 g/dL   RDW 14.1  11.5 - 15.5 %   Platelets 139 (*) 150 - 400 K/uL  BASIC METABOLIC PANEL      Result Value Range   Sodium 134 (*) 137 - 147 mEq/L   Potassium 3.8  3.7 - 5.3 mEq/L   Chloride 98  96 - 112 mEq/L   CO2 23  19 - 32 mEq/L   Glucose, Bld 102 (*) 70 - 99 mg/dL   BUN 24 (*) 6 - 23 mg/dL   Creatinine, Ser 1.43 (*) 0.50 - 1.35 mg/dL   Calcium 8.2 (*) 8.4 - 10.5 mg/dL   GFR calc non Af Amer 41 (*) >90 mL/min   GFR calc Af Amer 48 (*) >90 mL/min  GLUCOSE, CAPILLARY      Result Value Range   Glucose-Capillary 77  70 - 99 mg/dL   GLUCOSE, CAPILLARY      Result Value Range   Glucose-Capillary 75  70 - 99 mg/dL  GLUCOSE, CAPILLARY      Result Value Range   Glucose-Capillary 102 (*) 70 - 99 mg/dL   Comment 1 Documented in Chart     Comment 2 Notify RN    GLUCOSE, CAPILLARY      Result Value Range   Glucose-Capillary 167 (*) 70 - 99 mg/dL   Comment 1 Notify RN     Comment 2 Documented in Chart    POCT I-STAT, CHEM 8      Result Value Range   Sodium 139  137 - 147 mEq/L   Potassium 3.9  3.7 - 5.3 mEq/L   Chloride 97  96 - 112 mEq/L   BUN 25 (*) 6 - 23 mg/dL   Creatinine, Ser 1.40 (*) 0.50 - 1.35 mg/dL   Glucose, Bld 190 (*) 70 - 99 mg/dL   Calcium, Ion 1.30  1.13 - 1.30 mmol/L   TCO2 28  0 - 100 mmol/L   Hemoglobin 14.6  13.0 - 17.0 g/dL  HCT 43.0  39.0 - 52.0 %   A/P: Pt with hx of back pain/recent fall, prostate/bladder carcinoma, prior L3 KP 10/2012 and  CT on 10/07/13 revealing an acute/subacute L1 compression fracture. Plan is tent for L1 VP/KP on 1/13 at [facility] IR dept . Details/risks of procedure d/w pt/wife with their understanding and consent. Dr. Doyle Askew aware of plans. Pt will return to [facility] after procedure. Although contrast allergy listed in pt's chart both pt/wife deny this and pt was not premedicated prior to previous KP.

## 2013-10-11 NOTE — Progress Notes (Signed)
Patient ID: Guy [name], male   DOB: 1920/06/03, 80 y.o.   MRN: [id number]  [facility] PROGRESS NOTE  HENDRIK DONATH ZRA:[id number] DOB: Dec 29, 1919 DOA: 10/07/2013 PCP: [name], MD Brief narrative:  80 y.o. male history of prior falls and vertebroplasty of L3 in the past presenting to the ED after recent fall and with main concern of persistent and throbbing low back pain. Pain is occasionally radiating to lower extremities and is worse with ambulation, no specific alleviating factors. Pt denies numbness or tingling, no specific focal neurological symptoms. In ED CT of lumbar spine showed an L1 compression fracture. We were consulted for further evaluation and recommendations.   Principal Problem:  Compression fracture of L1 lumbar vertebra  - pt still in significant pain  - will continue to follow up on IR recommendations, plan for vertebroplasty in AM 10/12/2013   - continue analgesia as needed and PT if pt able to tolerate  Active Problems:  DM (diabetes mellitus)  - CBG's above 300, changed SSI to moderate coverage with meal coverage as well  - continue Lantus QHS  Essential hypertension, benign  - BP slightly elevated and possibly secondary to acute pain  - improved over the past 24 hours  - continue Metoprolol 50 mg PO QD and Hydralazine 10 mg TID PO  Depression  - stable  - continue home medical regimen  GERD (gastroesophageal reflux disease)  - stable, continue PPI  Urinary incontinence - oxybutynin daily as per home medical regimen  HLD  - continue statin  Acute renal failure  - secondary to pre renal etiology  - avoid nephrotoxins, Cr is trending down   - repeat BMP in AM  Recent UTI  - with E. Coli and Klebsiella  - pt not sure if he was treated  - placed on Levaquin QD, day #4/5  Consultants:  Interventional Radiology  Procedures/Studies:  Dg Cervical Spine 2 Or 3 Views 10/07/2013 Significantly diminished exam detail. If there is a high  clinical concern for cervical spine fracture then a cervical spine CT should be obtained. Cervical spondylosis.  Ct Lumbar Spine Wo Contrast 10/07/2013 L1 superior endplate compression fracture with approximately 40% vertebral body height loss appears acute or subacute. There is no bony retropulsion and the fracture has an appearance most consistent with a senile osteoporotic injury. Remote compression fractures of T12, L3 and L4. Degenerative change appearing worst at L3-4 and L4-5 as described above. Negative for CT evidence of metastatic disease. Osteopenia.  Antibiotics:  Levaquin 01/09 -->  Code Status: Full  Family Communication: Pt at bedside, wife over the phone  Disposition Plan: SNF, [place] [facility]    HPI/Subjective: No events overnight.   Objective: Filed Vitals:   10/09/13 2105 10/10/13 0530 10/10/13 1419 10/11/13 0427  BP: 150/71 150/71 147/79 162/75  Pulse: 68 68 60 68  Temp: 99.1 F (37.3 C) 99.1 F (37.3 C) 98.1 F (36.7 C) 98.3 F (36.8 C)  TempSrc: Oral Oral Oral Oral  Resp: 20 20 18 20   Height:      Weight:      SpO2: 94% 94% 96% 96%    Intake/Output Summary (Last 24 hours) at 10/11/13 0908 Last data filed at 10/11/13 5456  Gross per 24 hour  Intake    600 ml  Output    675 ml  Net    -75 ml    Exam:   General:  Pt is alert, follows commands appropriately, not in acute distress  Cardiovascular: Regular rate  and rhythm, S1/S2, no murmurs, no rubs, no gallops  Respiratory: Clear to auscultation bilaterally, no wheezing, no crackles, no rhonchi  Abdomen: Soft, non tender, non distended, bowel sounds present, no guarding  Extremities: No edema, pulses DP and PT palpable bilaterally  Neuro: Grossly nonfocal  Data Reviewed: Basic Metabolic Panel:  Recent Labs Lab 10/07/13 1233 10/07/13 1630 10/08/13 0526 10/09/13 0448 10/09/13 1318 10/10/13 0500 10/11/13 0510  NA 139  --  136* 134* 129* 136* 134*  K 3.9  --  4.2 3.9 4.1 4.4 3.8  CL 97  --   97 96 91* 99 98  CO2  --   --  27 23 26 28 23   GLUCOSE 190*  --  116* 189* 470* 210* 102*  BUN 25*  --  20 22 25* 27* 24*  CREATININE 1.40* 1.24 1.30 1.41* 1.41* 1.53* 1.43*  CALCIUM  --   --  9.2 8.5 8.4 8.6 8.2*  MG  --  1.7  --   --   --   --   --   PHOS  --  3.3  --   --   --   --   --    CBC:  Recent Labs Lab 10/07/13 1220  10/07/13 1630 10/08/13 0526 10/09/13 0448 10/10/13 0500 10/11/13 0510  WBC 11.6*  --  10.1 10.0 10.3 9.1 8.4  NEUTROABS 9.5*  --   --   --   --   --   --   HGB 13.4  < > 12.4* 12.1* 12.2* 12.3* 11.9*  HCT 39.0  < > 36.6* 34.9* 36.1* 35.6* 35.4*  MCV 96.3  --  95.8 95.1 96.3 95.4 96.7  PLT 123*  --  122* 115* 114* 131* 139*  < > = values in this interval not displayed.  CBG:  Recent Labs Lab 10/10/13 1147 10/10/13 1720 10/10/13 1722 10/10/13 2201 10/11/13 0731  GLUCAP 222* 77 75 102* 167*    Recent Results (from the past 240 hour(s))  MRSA PCR SCREENING     Status: Abnormal   Collection Time    10/07/13  3:41 PM      Result Value Range Status   MRSA by PCR POSITIVE (*) NEGATIVE Final   Comment:            The GeneXpert MRSA Assay (FDA     approved for NASAL specimens     only), is one component of a     comprehensive MRSA colonization     surveillance program. It is not     intended to diagnose MRSA     infection nor to guide or     monitor treatment for     MRSA infections.     RESULT CALLED TO, READ BACK BY AND VERIFIED WITH:     Servando Salina [id number] @ [id number] BY J SCOTTON  URINE CULTURE     Status: None   Collection Time    10/08/13 11:23 AM      Result Value Range Status   Specimen Description URINE, CATHETERIZED   Final   Special Requests NONE   Final   Culture  Setup Time     Final   Value: 10/08/2013 15:21     Performed at [facility]     Final   Value: 85,000 COLONIES/ML     Performed at [facility]   Culture     Final   Value: Multiple bacterial morphotypes present, none predominant.  Suggest appropriate recollection if clinically indicated.     Performed at [facility]   Report Status 10/09/2013 FINAL   Final     Scheduled Meds: . bicalutamide  50 mg Oral Daily  . docusate sodium  200 mg Oral Daily  . feeding supplement (GLUCERNA SHAKE)  237 mL Oral TID BM  . fluticasone  2 spray Each Nare BID  . hydrALAZINE  10 mg Oral Q8H  . insulin aspart  0-15 Units Subcutaneous TID WC  . insulin aspart  0-5 Units Subcutaneous QHS  . insulin aspart  4 Units Subcutaneous TID WC  . insulin glargine  16 Units Subcutaneous QHS  . levofloxacin (LEVAQUIN) IV  500 mg Intravenous Q48H  . lipase/protease/amylase  4 capsule Oral TID  . metoprolol  50 mg Oral Daily  . oxybutynin  5 mg Oral QHS  . pantoprazole  40 mg Oral Daily  . sertraline  100 mg Oral Daily  . simvastatin  40 mg Oral QHS   Continuous Infusions:    Faye Ramsay, MD  [facility] Pager [phone]  If 7PM-7AM, please contact night-coverage [email or web address] Password [email or web address] 10/11/2013, 9:08 AM   LOS: 4 days

## 2013-10-11 NOTE — Progress Notes (Signed)
Pt is requesting [facility] for [facility]. CSW has contacted [facility] and a decision regarding admission is pending.  Werner Lean LCSW [phone]

## 2013-10-11 NOTE — Progress Notes (Signed)
Pt / family have chosen [facility] [employer]. SNF will have an opening tomorrow if pt is stable for d/c.   Werner Lean LCSW [phone]

## 2013-10-11 NOTE — Progress Notes (Signed)
Physical Therapy Treatment Patient Details Name: Guy [name] MRN: [id number] DOB: [birth year] Today's Date: 10/11/2013 Time: 2025-4270 PT Time Calculation (min): 34 min  PT Assessment / Plan / Recommendation  History of Present Illness 80yo male With history of prior falls and vertebral plasty of L3 in the past presenting to the ED after recent fall complaining of low back pain. He states that the back pain has been present all day every day for the last few weeks. The problem has persisted and he feels has gotten worse. He denies any bladder incontinence. Also denies any lower extremity weakness in one leg more than the other although he does report pain more in the right leg in the left. As mentioned the pain is persistent and not getting better as a result patient presented to the ED for further evaluation. He has had tramadol for help with pain and also another pain medicine described as a strong pain medicine although wife is unable to tell me what pain medicine that is. But she is having a hard time taking care of him at home and as such given that his pain has gotten worse decided to bring him in for evaluation.   PT Comments   Pt doing better today; pain getting better but still increases with movement; Pt to go for KP/VP tomorrow  Follow Up Recommendations  SNF     Does the patient have the potential to tolerate intense rehabilitation     Barriers to Discharge        Equipment Recommendations  None recommended by PT    Recommendations for Other Services    Frequency Min 3X/week   Progress towards PT Goals Progress towards PT goals: Progressing toward goals  Plan Current plan remains appropriate    Precautions / Restrictions Precautions Precautions: Back;Fall Precaution Comments: pt to have corset per MD notes, type or donning of brace not specified; no orders; will leave note for clarification Required Braces or Orthoses: Spinal Brace Spinal Brace: Lumbar corset;Applied in  sitting position (no orders to specify)   Pertinent Vitals/Pain Mild DOE With amb   Mobility  Bed Mobility Overal bed mobility: Needs Assistance Bed Mobility: Sit to Sidelying Rolling: Min assist Sit to sidelying: Min assist General bed mobility comments: cues for safe technique Transfers Overall transfer level: Needs assistance Equipment used: Rolling walker (2 wheeled) Transfers: Sit to/from Stand Sit to Stand: Min guard;Min assist (repeated x3 for nsg to change dressing) General transfer comment: cues for hand placement Ambulation/Gait Ambulation/Gait assistance: Min assist;Min guard Ambulation Distance (Feet): 45 Feet Assistive device: Rolling walker (2 wheeled) Gait Pattern/deviations: Step-to pattern;Decreased stride length General Gait Details: cues for step length, posture    Exercises     PT Diagnosis:    PT Problem List:   PT Treatment Interventions:     PT Goals (current goals can now be found in the care plan section) Acute Rehab PT Goals Patient Stated Goal: decrease pain Time For Goal Achievement: 10/15/13 Potential to Achieve Goals: Good  Visit Information  Last PT Received On: 10/11/13 Assistance Needed: +1 History of Present Illness: 80yo male With history of prior falls and vertebral plasty of L3 in the past presenting to the ED after recent fall complaining of low back pain. He states that the back pain has been present all day every day for the last few weeks. The problem has persisted and he feels has gotten worse. He denies any bladder incontinence. Also denies any lower extremity weakness in one leg  more than the other although he does report pain more in the right leg in the left. As mentioned the pain is persistent and not getting better as a result patient presented to the ED for further evaluation. He has had tramadol for help with pain and also another pain medicine described as a strong pain medicine although wife is unable to tell me what pain  medicine that is. But she is having a hard time taking care of him at home and as such given that his pain has gotten worse decided to bring him in for evaluation.    Subjective Data  Patient Stated Goal: decrease pain   Cognition  Cognition Arousal/Alertness: Awake/alert Behavior During Therapy: WFL for tasks assessed/performed Overall Cognitive Status: Within Functional Limits for tasks assessed    Balance  Balance Overall balance assessment: Needs assistance Standing balance-Leahy Scale: Poor  End of Session PT - End of Session Equipment Utilized During Treatment: Gait belt Activity Tolerance: Patient tolerated treatment well Patient left: in chair;with call bell/phone within reach Nurse Communication: Patient requests pain meds;Mobility status   GP     [facility] 10/11/2013, 12:42 PM

## 2013-10-12 ENCOUNTER — Ambulatory Visit (HOSPITAL_COMMUNITY)
Admit: 2013-10-12 | Discharge: 2013-10-12 | Disposition: A | Discharge status: Home / Self Care | Payer: Medicare Other | Attending: Internal Medicine | Admitting: Internal Medicine

## 2013-10-12 LAB — GLUCOSE, CAPILLARY
GLUCOSE-CAPILLARY: 109 mg/dL — AB (ref 70–99)
Glucose-Capillary: 145 mg/dL — ABNORMAL HIGH (ref 70–99)

## 2013-10-12 LAB — BASIC METABOLIC PANEL
BUN: 25 mg/dL — ABNORMAL HIGH (ref 6–23)
CO2: 24 mEq/L (ref 19–32)
Calcium: 8.4 mg/dL (ref 8.4–10.5)
Chloride: 100 mEq/L (ref 96–112)
Creatinine, Ser: 1.44 mg/dL — ABNORMAL HIGH (ref 0.50–1.35)
GFR calc Af Amer: 47 mL/min — ABNORMAL LOW (ref >90)
GFR, EST NON AFRICAN AMERICAN: 41 mL/min — AB (ref >90)
Glucose, Bld: 92 mg/dL (ref 70–99)
POTASSIUM: 4.3 meq/L (ref 3.7–5.3)
SODIUM: 136 meq/L — AB (ref 137–147)

## 2013-10-12 LAB — CBC
HCT: 35.1 % — ABNORMAL LOW (ref 39.0–52.0)
HEMOGLOBIN: 11.7 g/dL — AB (ref 13.0–17.0)
MCH: 32.3 pg (ref 26.0–34.0)
MCHC: 33.3 g/dL (ref 30.0–36.0)
MCV: 97 fL (ref 78.0–100.0)
Platelets: 145 10*3/uL — ABNORMAL LOW (ref 150–400)
RBC: 3.62 MIL/uL — ABNORMAL LOW (ref 4.22–5.81)
RDW: 13.9 % (ref 11.5–15.5)
WBC: 9.9 10*3/uL (ref 4.0–10.5)

## 2013-10-12 MED ORDER — MIDAZOLAM HCL 2 MG/2ML IJ SOLN
INTRAMUSCULAR | Status: AC
  Filled 2013-10-12: qty 4

## 2013-10-12 MED ORDER — FENTANYL CITRATE 0.05 MG/ML IJ SOLN
INTRAMUSCULAR | Status: AC | PRN
  Administered 2013-10-12 (×2): 25 ug via INTRAVENOUS

## 2013-10-12 MED ORDER — TOBRAMYCIN SULFATE 1.2 G IJ SOLR
INTRAMUSCULAR | Status: AC
  Filled 2013-10-12: qty 1.2

## 2013-10-12 MED ORDER — HYDROMORPHONE HCL PF 1 MG/ML IJ SOLN
INTRAMUSCULAR | Status: AC
  Filled 2013-10-12: qty 2

## 2013-10-12 MED ORDER — HYDROCODONE-ACETAMINOPHEN 5-325 MG PO TABS: 1.0000 | ORAL_TABLET | ORAL | Status: DC | PRN

## 2013-10-12 MED ORDER — FENTANYL CITRATE 0.05 MG/ML IJ SOLN
INTRAMUSCULAR | Status: AC
  Filled 2013-10-12: qty 4

## 2013-10-12 MED ORDER — SODIUM CHLORIDE 0.9 % IV SOLN: INTRAVENOUS | Status: AC

## 2013-10-12 MED ORDER — TRAMADOL HCL 50 MG PO TABS: 50.0000 mg | ORAL_TABLET | Freq: Four times a day (QID) | ORAL | Status: DC | PRN

## 2013-10-12 MED ORDER — MIDAZOLAM HCL 2 MG/2ML IJ SOLN
INTRAMUSCULAR | Status: AC | PRN
  Administered 2013-10-12 (×2): 1 mg via INTRAVENOUS

## 2013-10-12 NOTE — Progress Notes (Signed)
Report called to [facility]

## 2013-10-12 NOTE — ED Notes (Signed)
Transport report given to [employer]. Questions asked and answered

## 2013-10-12 NOTE — Procedures (Signed)
S/P L1  Balloon KP   For compression fracture.

## 2013-10-12 NOTE — Discharge Instructions (Signed)
Vertebral Fracture  You have a fracture of one or more vertebra. These are the bony parts that form the spine. Minor vertebral fractures happen when people fall. Osteoporosis is associated with many of these fractures. Hospital care may not be necessary for minor compression fractures that are stable. However, multiple fractures of the spine or unstable injuries can cause severe pain and even damage the spinal cord. A spinal cord injury may cause paralysis, numbness, or loss of normal bowel and bladder control.   Normally there is pain and stiffness in the back for 3 to 6 weeks after a vertebral fracture. Bed rest for several days, pain medicine, and a slow return to activity is often the only treatment that is needed depending on the location of the fracture. Neck and back braces may be helpful in reducing pain and increasing mobility. When your pain allows, you should begin walking or swimming to help maintain your endurance. Exercises to improve motion and to strengthen the back may also be useful after the initial pain improves. Treatment for osteoporosis may be essential for full recovery. This will help reduce your risk of vertebral fractures with a future fall.  During the first few days after a spine fracture you may feel nauseated or vomit. If this is severe, hospital care with IV fluids will be needed.   Arrange for follow-up care as recommended to assure proper long-term care and prevention of further spine injury.   SEEK IMMEDIATE MEDICAL CARE IF:   You have increasing pain, vomiting, or are unable to move around at all.   You develop numbness, tingling, weakness, or paralysis of any part of your body.   You develop a loss of normal bowel or bladder control.   You have difficulty breathing, cough, fever, chest or abdominal pain.  MAKE SURE YOU:    Understand these instructions.   Will watch your condition.   Will get help right away if you are not doing well or get worse.  Document Released:  10/24/2004 Document Revised: 12/09/2011 Document Reviewed: 05/09/2009  ExitCare Patient Information 2014 [employer].

## 2013-10-12 NOTE — Progress Notes (Signed)
Clinical Social Work Department CLINICAL SOCIAL WORK PLACEMENT NOTE 10/12/2013  Patient:  Guy [name], Guy [name]  Account Number:  [id number] Admit date:  10/07/2013  Clinical Social Worker:  Werner Lean, LCSW  Date/time:  10/08/2013 04:22 PM  Clinical Social Work is seeking post-discharge placement for this patient at the following level of care:   SKILLED NURSING   (*CSW will update this form in Epic as items are completed)   10/08/2013  Patient/family provided with [facility] Department of Clinical Social Work's list of facilities offering this level of care within the geographic area requested by the patient (or if unable, by the patient's family).    Patient/family informed of their freedom to choose among providers that offer the needed level of care, that participate in Medicare, Medicaid or managed care program needed by the patient, have an available bed and are willing to accept the patient.    Patient/family informed of [facility]' ownership interest in [facility], as well as of the fact that they are under no obligation to receive care at this facility.  PASARR submitted to EDS on  PASARR number received from EDS on 11/18/2012  FL2 transmitted to all facilities in geographic area requested by pt/family on  10/08/2013 FL2 transmitted to all facilities within larger geographic area on   Patient informed that his/her managed care company has contracts with or will negotiate with  certain facilities, including the following:     Patient/family informed of bed offers received:  10/11/2013 Patient chooses bed at [facility] Physician recommends and patient chooses bed at    Patient to be transferred to [facility] on  10/12/2013 Patient to be transferred to facility by [facility]  The following physician request were entered in Epic:   Additional Comments:  Werner Lean LCSW [phone]

## 2013-10-12 NOTE — Discharge Summary (Signed)
Physician Discharge Summary  Guy [name] YQI:[id number] DOB: 14-Sep-1920 DOA: 10/07/2013  PCP: [name], MD  Admit date: 10/07/2013 Discharge date: 10/12/2013  Recommendations for Outpatient Follow-up:  1. Pt will need to follow up with PCP in 2-3 weeks post discharge 2. Please obtain BMP to evaluate electrolytes and kidney function 3. Please also check CBC to evaluate Hg and Hct level 4. Please note that pt has an appointment with urologist on January 19th, 2015  Discharge Diagnoses:   Compression fracture of L1 lumbar vertebra Principal Problem:   Compression fracture of L1 lumbar vertebra Active Problems:   DM (diabetes mellitus)   Essential hypertension, benign   CAD (coronary artery disease)   Depression   GERD (gastroesophageal reflux disease)   Urinary incontinence   Lumbar compression fracture  Discharge Condition: Stable  Diet recommendation: Heart healthy diet discussed in details   Brief narrative:  80 y.o. male history of prior falls and vertebroplasty of L3 in the past presenting to the ED after recent fall and with main concern of persistent and throbbing low back pain. Pain is occasionally radiating to lower extremities and is worse with ambulation, no specific alleviating factors. Pt denies numbness or tingling, no specific focal neurological symptoms. In ED CT of lumbar spine showed an L1 compression fracture. We were consulted for further evaluation and recommendations.   Principal Problem:  Compression fracture of L1 lumbar vertebra  - pt still in significant pain  - s/p vertebroplasty 10/12/2013, pt doing well but has mild pain  - continue analgesia as needed and PT upon discharge to SNF Active Problems:  DM (diabetes mellitus)  - reasonable inpatient control  - continue Lantus QHS  Essential hypertension, benign  - BP slightly elevated and possibly secondary to acute pain  - improved over the past 24 hours  - continue home medical regimen   Depression  - stable  - continue home medical regimen  GERD (gastroesophageal reflux disease)  - stable, continue PPI  Urinary incontinence - oxybutynin daily as per home medical regimen  HLD  - continue statin  Acute renal failure  - secondary to pre renal etiology  - avoid nephrotoxins, Cr is trending down  Recent UTI  - with E. Coli and Klebsiella  - pt not sure if he was treated  - placed on Levaquin QD, day #5/5  - no need for ABX upon discharge   Consultants:  Interventional Radiology  Urology for foley change  Procedures/Studies:  Dg Cervical Spine 2 Or 3 Views 10/07/2013 Significantly diminished exam detail. If there is a high clinical concern for cervical spine fracture then a cervical spine CT should be obtained. Cervical spondylosis.  Ct Lumbar Spine Wo Contrast 10/07/2013 L1 superior endplate compression fracture with approximately 40% vertebral body height loss appears acute or subacute. There is no bony retropulsion and the fracture has an appearance most consistent with a senile osteoporotic injury. Remote compression fractures of T12, L3 and L4. Degenerative change appearing worst at L3-4 and L4-5 as described above. Negative for CT evidence of metastatic disease. Osteopenia.  Antibiotics:  Levaquin 01/09 --> 10/12/2013  Code Status: Full  Family Communication: Pt at bedside, wife over the phone  Disposition Plan: SNF, [facility] today   Discharge Exam: Filed Vitals:   10/12/13 1300  BP: 162/75  Pulse: 73  Temp: 97.9 F (36.6 C)  Resp: 18   Filed Vitals:   10/12/13 0544 10/12/13 0900 10/12/13 1235 10/12/13 1300  BP: 157/72 147/74 167/76 162/75  Pulse:  75 74 73  Temp:  98.4 F (36.9 C) 98.9 F (37.2 C) 97.9 F (36.6 C)  TempSrc:  Oral    Resp:  18 20 18   Height:      Weight:      SpO2:  95% 98%     General: Pt is alert, follows commands appropriately, not in acute distress Cardiovascular: Regular rate and rhythm, S1/S2 +, no murmurs, no rubs,  no gallops Respiratory: Clear to auscultation bilaterally, no wheezing, no crackles, no rhonchi Abdominal: Soft, non tender, non distended, bowel sounds +, no guarding Extremities: no edema, no cyanosis, pulses palpable bilaterally DP and PT Neuro: Grossly nonfocal  Discharge Instructions  Discharge Orders   Future Orders Complete By Expires   Diet - low sodium heart healthy  As directed    Increase activity slowly  As directed        Medication List         acetaminophen 325 MG tablet  Commonly known as:  TYLENOL  Take 650 mg by mouth 3 (three) times daily as needed for mild pain or moderate pain.     amLODipine 10 MG tablet  Commonly known as:  NORVASC  Take 10 mg by mouth daily.     bicalutamide 50 MG tablet  Commonly known as:  CASODEX  Take 50 mg by mouth daily.     CALCIUM 600+D HIGH POTENCY 600-400 MG-UNIT per tablet  Generic drug:  Calcium Carbonate-Vitamin D  Take 1 tablet by mouth 2 (two) times daily.     carboxymethylcellulose 0.5 % Soln  Commonly known as:  REFRESH PLUS  Place 1 drop into both eyes 2 (two) times daily as needed (for dry eyes).     docusate sodium 100 MG capsule  Commonly known as:  COLACE  Take 200 mg by mouth daily.     fluticasone 50 MCG/ACT nasal spray  Commonly known as:  FLONASE  Place 2 sprays into the nose 2 (two) times daily.     glimepiride 4 MG tablet  Commonly known as:  AMARYL  Take 4 mg by mouth daily before breakfast.     HYDROcodone-acetaminophen 5-325 MG per tablet  Commonly known as:  NORCO/VICODIN  Take 1-2 tablets by mouth every 4 (four) hours as needed for moderate pain.     hydrOXYzine 25 MG tablet  Commonly known as:  ATARAX/VISTARIL  Take 25 mg by mouth 2 (two) times daily.     insulin aspart 100 UNIT/ML injection  Commonly known as:  novoLOG  Inject 0-6 Units into the skin 3 (three) times daily before meals. 0-150=0; 151-200=2u; 201-250=3u; 251-300=4u; 301-350=5u; over 350 =6u prior to meals     insulin  glargine 100 UNIT/ML injection  Commonly known as:  LANTUS  Inject 16 Units into the skin at bedtime.     lipase/protease/amylase 12000 UNITS Cpep capsule  Commonly known as:  CREON-12/PANCREASE  Take 4 capsules by mouth 3 (three) times daily.     Melatonin 3 MG Tabs  Take 3 mg by mouth at bedtime.     metoprolol 50 MG tablet  Commonly known as:  LOPRESSOR  Take 50 mg by mouth daily.     multivitamins ther. w/minerals Tabs tablet  Take 1 tablet by mouth every morning.     nitroGLYCERIN 0.4 MG SL tablet  Commonly known as:  NITROSTAT  Place 0.4 mg under the tongue every 5 (five) minutes as needed for chest pain.     omeprazole 20 MG  capsule  Commonly known as:  PRILOSEC  Take 20 mg by mouth 2 (two) times daily.     oxybutynin 5 MG tablet  Commonly known as:  DITROPAN  Take 5 mg by mouth at bedtime.     sertraline 100 MG tablet  Commonly known as:  ZOLOFT  Take 100 mg by mouth daily.     simvastatin 40 MG tablet  Commonly known as:  ZOCOR  Take 40 mg by mouth at bedtime.     traMADol 50 MG tablet  Commonly known as:  ULTRAM  Take 1 tablet (50 mg total) by mouth every 6 (six) hours as needed for moderate pain or severe pain.           Follow-up Information   Follow up with ALTHEIMER,MICHAEL D, MD In 2 weeks.   Specialty:  Endocrinology   Contact information:   [place] [address] [phone]       Follow up with Faye Ramsay, MD. (call my cell phone [phone])    Specialty:  Internal Medicine   Contact information:   [address] [place] [address] [phone]        The results of significant diagnostics from this hospitalization (including imaging, microbiology, ancillary and laboratory) are listed below for reference.     Microbiology: Recent Results (from the past 240 hour(s))  MRSA PCR SCREENING     Status: Abnormal   Collection Time    10/07/13  3:41 PM      Result Value Range Status   MRSA by  PCR POSITIVE (*) NEGATIVE Final   Comment:            The GeneXpert MRSA Assay (FDA     approved for NASAL specimens     only), is one component of a     comprehensive MRSA colonization     surveillance program. It is not     intended to diagnose MRSA     infection nor to guide or     monitor treatment for     MRSA infections.     RESULT CALLED TO, READ BACK BY AND VERIFIED WITH:     Servando Salina [id number] @ 6734 BY J SCOTTON  URINE CULTURE     Status: None   Collection Time    10/08/13 11:23 AM      Result Value Range Status   Specimen Description URINE, CATHETERIZED   Final   Special Requests NONE   Final   Culture  Setup Time     Final   Value: 10/08/2013 15:21     Performed at [facility]     Final   Value: 85,000 COLONIES/ML     Performed at [facility]   Culture     Final   Value: Multiple bacterial morphotypes present, none predominant. Suggest appropriate recollection if clinically indicated.     Performed at [facility]   Report Status 10/09/2013 FINAL   Final     Labs: Basic Metabolic Panel:  Recent Labs Lab 10/07/13 1233 10/07/13 1630  10/09/13 0448 10/09/13 1318 10/10/13 0500 10/11/13 0510 10/12/13 0500  NA 139  --   < > 134* 129* 136* 134* 136*  K 3.9  --   < > 3.9 4.1 4.4 3.8 4.3  CL 97  --   < > 96 91* 99 98 100  CO2  --   --   < > 23 26 28  23 24  GLUCOSE 190*  --   < > 189* 470* 210* 102* 92  BUN 25*  --   < > 22 25* 27* 24* 25*  CREATININE 1.40* 1.24  < > 1.41* 1.41* 1.53* 1.43* 1.44*  CALCIUM  --   --   < > 8.5 8.4 8.6 8.2* 8.4  MG  --  1.7  --   --   --   --   --   --   PHOS  --  3.3  --   --   --   --   --   --   < > = values in this interval not displayed.   CBC:  Recent Labs Lab 10/07/13 1220  10/08/13 0526 10/09/13 0448 10/10/13 0500 10/11/13 0510 10/12/13 0500  WBC 11.6*  < > 10.0 10.3 9.1 8.4 9.9  NEUTROABS 9.5*  --   --   --   --   --   --   HGB 13.4  < > 12.1* 12.2* 12.3* 11.9* 11.7*   HCT 39.0  < > 34.9* 36.1* 35.6* 35.4* 35.1*  MCV 96.3  < > 95.1 96.3 95.4 96.7 97.0  PLT 123*  < > 115* 114* 131* 139* 145*  < > = values in this interval not displayed.  CBG:  Recent Labs Lab 10/11/13 0731 10/11/13 1237 10/11/13 1647 10/11/13 2051 10/12/13 0717  GLUCAP 167* 230* 259* 76 109*     SIGNED: Time coordinating discharge: Over 30 minutes  MAGICK-MYERS, ISKRA, MD  [facility] 10/12/2013, 1:30 PM Pager [phone]  If 7PM-7AM, please contact night-coverage [email or web address] Password [email or web address]

## 2013-10-12 NOTE — Plan of Care (Signed)
Problem: Phase I Progression Outcomes Goal: OOB as tolerated unless otherwise ordered Outcome: Progressing Pt ambulates to bathroom with minimal assistance.   Problem: Phase II Progression Outcomes Goal: Vital signs remain stable Outcome: Progressing Vital signs stable

## 2013-10-12 NOTE — Progress Notes (Signed)
Pt has ST Rehab bed at [facility] today if stable for d/c. CSW will assist with d/c planning to SNF.  Werner Lean LCSW [phone]

## 2013-10-12 NOTE — Discharge Instructions (Signed)
1.No stooping,bending or  lifting  more than 10 lbs for 2 weeks. 2.Use walker for ambulation. 3.RTC in 2 weeks .

## 2013-10-12 NOTE — ED Notes (Signed)
Patient complains of pain and is unable to rest comfortably at this time.  MD informed.  Will proceed with Time out at this time in order to medicate pt for pain

## 2013-10-13 ENCOUNTER — Other Ambulatory Visit: Payer: Self-pay | Admitting: *Deleted

## 2013-10-13 ENCOUNTER — Non-Acute Institutional Stay (SKILLED_NURSING_FACILITY): Payer: Medicare Other | Admitting: Adult Health

## 2013-10-13 ENCOUNTER — Ambulatory Visit: Payer: Medicare Other | Admitting: Physical Therapy

## 2013-10-13 DIAGNOSIS — I10 Essential (primary) hypertension: Secondary | ICD-10-CM

## 2013-10-13 DIAGNOSIS — S32000A Wedge compression fracture of unspecified lumbar vertebra, initial encounter for closed fracture: Secondary | ICD-10-CM

## 2013-10-13 DIAGNOSIS — F3289 Other specified depressive episodes: Secondary | ICD-10-CM

## 2013-10-13 DIAGNOSIS — K219 Gastro-esophageal reflux disease without esophagitis: Secondary | ICD-10-CM

## 2013-10-13 DIAGNOSIS — F32A Depression, unspecified: Secondary | ICD-10-CM

## 2013-10-13 DIAGNOSIS — E785 Hyperlipidemia, unspecified: Secondary | ICD-10-CM

## 2013-10-13 DIAGNOSIS — S32009A Unspecified fracture of unspecified lumbar vertebra, initial encounter for closed fracture: Secondary | ICD-10-CM

## 2013-10-13 DIAGNOSIS — E119 Type 2 diabetes mellitus without complications: Secondary | ICD-10-CM

## 2013-10-13 DIAGNOSIS — R32 Unspecified urinary incontinence: Secondary | ICD-10-CM

## 2013-10-13 DIAGNOSIS — F329 Major depressive disorder, single episode, unspecified: Secondary | ICD-10-CM

## 2013-10-13 MED ORDER — TRAMADOL HCL 50 MG PO TABS: ORAL_TABLET | ORAL | Status: DC

## 2013-10-13 MED ORDER — HYDROCODONE-ACETAMINOPHEN 5-325 MG PO TABS: ORAL_TABLET | ORAL | Status: DC

## 2013-10-14 ENCOUNTER — Ambulatory Visit: Payer: Medicare Other | Admitting: Physical Therapy

## 2013-10-15 ENCOUNTER — Non-Acute Institutional Stay (SKILLED_NURSING_FACILITY): Payer: Medicare Other | Admitting: Internal Medicine

## 2013-10-15 DIAGNOSIS — I2510 Atherosclerotic heart disease of native coronary artery without angina pectoris: Secondary | ICD-10-CM

## 2013-10-15 DIAGNOSIS — I10 Essential (primary) hypertension: Secondary | ICD-10-CM

## 2013-10-15 DIAGNOSIS — S32010A Wedge compression fracture of first lumbar vertebra, initial encounter for closed fracture: Secondary | ICD-10-CM

## 2013-10-15 DIAGNOSIS — E119 Type 2 diabetes mellitus without complications: Secondary | ICD-10-CM

## 2013-10-15 DIAGNOSIS — S32009A Unspecified fracture of unspecified lumbar vertebra, initial encounter for closed fracture: Secondary | ICD-10-CM

## 2013-10-17 NOTE — Progress Notes (Signed)
HISTORY & PHYSICAL  DATE: 10/15/2013   FACILITY: [facility]  LEVEL OF CARE: SNF (31)  ALLERGIES:  Allergies  Allergen Reactions  . Albuterol Sulfate Hfa [Kdc:Albuterol] Shortness Of Breath and Swelling  . Adhesive [Tape] Other (See Comments)    REACTION: SKIN BLISTERS  . Albuterol Swelling    Per pt., side of his neck began to swell with nebulized Albuterol in doctor's office.  . Amlodipine Other (See Comments)    Reaction unknown  . Contrast Media [Iodinated Diagnostic Agents] Other (See Comments)    Reaction unknown  . Lactose     Other reaction(s): GI Upset (intolerance)  . Metrizamide Other (See Comments)    Reaction unknown  . Norvasc [Amlodipine Besylate] Other (See Comments)    Reaction unknown  . Penicillins Other (See Comments) and Itching    REACTION: ITCHING HANDS  . Sulfa Drugs Cross Reactors Hives  . Sulfamethoxazole Rash    CHIEF COMPLAINT:  Manage L1 compression fracture, diabetes mellitus and CAD  HISTORY OF PRESENT ILLNESS: Patient is a 80 year old Caucasian knee and was hospitalized after a fall.  COMPRESSION FX: The patient fell and sustained a compression fracture. Patient subsequently underwent vertebroplasty and tolerated the procedure well. Patient is admitted to this facility for short-term rehabilitation. Patient denies back pain currently. No complications reported from the pain medications currently being used.  DM:pt's DM remains stable.  Pt denies polyuria, polydipsia, polyphagia, changes in vision or hypoglycemic episodes.  No complications noted from the medication presently being used.  Last hemoglobin A1c is: Not available.  CAD: The angina has been stable. The patient denies dyspnea on exertion, orthopnea, pedal edema, palpitations and paroxysmal nocturnal dyspnea. No complications noted from the medication presently being used.  PAST MEDICAL HISTORY :  Past Medical History  Diagnosis Date  . Hypertension    . Diabetes mellitus without complication   . Arthritis   . Sciatica   . GERD (gastroesophageal reflux disease)   . Urge incontinence   . Vertigo   . Hyperlipidemia   . ED (erectile dysfunction)   . Gastric polyposis   . Osteoporosis   . Decreased libido   . Depression   . Hepatitis A   . Stroke   . History of blood transfusion     1946  . OA (osteoarthritis)   . Bladder cancer   . Prostate cancer     S/P prostatectomy; Lung metastasis  . CAD (coronary artery disease) 1996    stent to the LAD   . Hyperlipidemia   . Type 2 diabetes mellitus     PAST SURGICAL HISTORY: Past Surgical History  Procedure Laterality Date  . Carpal tunnel release    . Cataract extraction w/ intraocular lens  implant, bilateral  1998  . Prostatectomy    . Urinary sphincter implant    . Urinary sphincter implant revision    . Appendectomy    . Left hip surgery      Donated bone for bone graft to arm  . Penile prosthesis implant      S/P removal and re-implantation of new prosthesis  . Middle ear surgery    . Finger surgery      Left and right  . Hernia repair    . Bladder surgery    . Right arm bone graft      Pathological fracture  . Cardiac catheterization  11/27/2001    patent stent with mild in-stent restenosis.  [name]  Myoview  06/12/2009    Persantine myoview EF 76%; Normal myoview  . Coronary angioplasty  1996    stenting to the LAD in [place].     SOCIAL HISTORY:  reports that he has quit smoking. He has never used smokeless tobacco. He reports that he does not drink alcohol or use illicit drugs.  FAMILY HISTORY:  Family History  Problem Relation Age of Onset  . Heart failure Mother   . Bladder Cancer Father   . Pancreatic cancer Sister   . Lung cancer Sister   . Breast cancer Daughter   . Liver disease Son     CURRENT MEDICATIONS: Reviewed per [facility]  REVIEW OF SYSTEMS:  See HPI otherwise 14 point ROS is negative.  PHYSICAL EXAMINATION  VS:  T 99.9        P 63        RR 20      BP 160/74       POX%  98        GENERAL: no acute distress, normal body habitus EYES: conjunctivae normal, sclerae normal, normal eye lids MOUTH/THROAT: lips without lesions,no lesions in the mouth,tongue is without lesions,uvula elevates in midline NECK: supple, trachea midline, no neck masses, no thyroid tenderness, no thyromegaly LYMPHATICS: no LAN in the neck, no supraclavicular LAN RESPIRATORY: breathing is even & unlabored, BS CTAB CARDIAC: RRR, no murmur,no extra heart sounds, no edema GI:  ABDOMEN: abdomen soft, normal BS, no masses, no tenderness  LIVER/SPLEEN: no hepatomegaly, no splenomegaly MUSCULOSKELETAL: HEAD: normal to inspection & palpation BACK: no kyphosis, scoliosis or spinal processes tenderness EXTREMITIES: LEFT UPPER EXTREMITY: full range of motion, normal strength & tone RIGHT UPPER EXTREMITY:  range of motion unable to perform, normal strength & tone LEFT LOWER EXTREMITY:  full range of motion, normal strength & tone RIGHT LOWER EXTREMITY:  Moderate range of motion, normal strength & tone PSYCHIATRIC: the patient is alert & oriented to person, affect & behavior appropriate  LABS/RADIOLOGY:  Labs reviewed: Basic Metabolic Panel:  Recent Labs  10/07/13 1233 10/07/13 1630  10/10/13 0500 10/11/13 0510 10/12/13 0500  NA 139  --   < > 136* 134* 136*  K 3.9  --   < > 4.4 3.8 4.3  CL 97  --   < > 99 98 100  CO2  --   --   < > 28 23 24   GLUCOSE 190*  --   < > 210* 102* 92  BUN 25*  --   < > 27* 24* 25*  CREATININE 1.40* 1.24  < > 1.53* 1.43* 1.44*  CALCIUM  --   --   < > 8.6 8.2* 8.4  MG  --  1.7  --   --   --   --   PHOS  --  3.3  --   --   --   --   < > = values in this interval not displayed. Liver Function Tests:  Recent Labs  01/04/13 1120 03/20/13 1834 03/21/13 0532  AST 27 52* 41*  ALT 14 23 20   ALKPHOS 113 126* 106  BILITOT 0.6 0.3 0.3  PROT 5.8* 7.3 6.5  ALBUMIN 1.8* 2.5* 2.2*    Recent Labs  11/17/12 0408  AMMONIA 33     CBC:  Recent Labs  06/24/13 1250  09/30/13 1555 10/07/13 1220  10/10/13 0500 10/11/13 0510 10/12/13 0500  WBC 6.1  --  11.1* 11.6*  < > 9.1 8.4 9.9  NEUTROABS 3.3  --  7.0 9.5*  --   --   --   --   HGB 10.3*  < > 13.9 13.4  < > 12.3* 11.9* 11.7*  HCT 31.1*  < > 40.2 39.0  < > 35.6* 35.4* 35.1*  MCV 94.0  --  95.7 96.3  < > 95.4 96.7 97.0  PLT 129*  --  185 123*  < > 131* 139* 145*  < > = values in this interval not displayed.  Lipid Panel:  Recent Labs  12/30/12 0500  HDL 26*   Cardiac Enzymes:  Recent Labs  03/20/13 1834 03/20/13 2248 03/21/13 0532  TROPONINI <0.30 <0.30 <0.30   CBG:  Recent Labs  10/11/13 2051 10/12/13 0717 10/12/13 1352  GLUCAP 76 109* 145*   CT LUMBAR SPINE WITHOUT CONTRAST   TECHNIQUE: Multidetector CT imaging of the lumbar spine was performed without intravenous contrast administration. Multiplanar CT image reconstructions were also generated.   COMPARISON:  Plain films lumbar spine 09/30/2013. MRI lumbar spine 11/19/2012.   FINDINGS: Bones appear osteopenic. The patient has a compression fracture deformity of L1 with approximately 40% vertebral body height loss centrally. There is no extension into the posterior elements and no bony retropulsion. The fracture appears acute or subacute with fracture lines visible and sharp fracture margins identified. There is a minimal superior endplate compression fracture of T12. Mild biconcave deformity of L4 is remote. Old L3 compression fracture is identified with postoperative change of vertebral augmentation noted.   The patient has convex right scoliosis. No lytic or sclerotic bony lesion is identified. Visualized sacrum and posterior iliac wings appear intact. Imaged intra-abdominal contents demonstrate aortic atherosclerosis.   T11-12: Minimal disc bulge without central canal or foraminal narrowing.   T12-L1: Shallow, calcified left paracentral protrusion without central  canal or foraminal stenosis.   L1-2: Shallow disc bulge with some ligamentum flavum thickening and mild facet arthropathy. Central canal and foramina appear open.   L3-4: Ligamentum flavum thickening and a shallow disc bulge cause mild to moderate central canal narrowing. Foramina appear open.   L3-4: There is prominent ligamentum flavum thickening and a broad-based disc bulge. Moderately severe to severe central canal stenosis is present. The foramina are narrowed bilaterally, worse on the left.   L4-5: Bulky ligamentum flavum thickening and a shallow disc bulge result in severe central canal stenosis. There is right worse than left foraminal narrowing.   L5-S1:  Negative.   IMPRESSION: L1 superior endplate compression fracture with approximately 40% vertebral body height loss appears acute or subacute. There is no bony retropulsion and the fracture has an appearance most consistent with a senile osteoporotic injury.   Remote compression fractures of T12, L3 and L4.   Degenerative change appearing worst at L3-4 and L4-5 as described above.   Negative for CT evidence of metastatic disease.   Osteopenia. CERVICAL SPINE - 2-3 VIEW   COMPARISON:  None.   FINDINGS: Exam detail is diminished due to overlying bony and soft tissue structures. The C1 through C6 vertebra are only visualized. There is no evidence for cervical spine fracture. No dislocation. Multi level disc space narrowing and ventral endplate spurring is noted. This is most severe at C3-4 and C4-5.   IMPRESSION: Significantly diminished exam detail. If there is a high clinical concern for cervical spine fracture then a cervical spine CT should be obtained.   Cervical spondylosis.    ASSESSMENT/PLAN:  L1 compression fracture-status post vertebroplasty. Continue rehabilitation. Diabetes mellitus-continue current medications CAD-stable Hypertension-blood pressure elevated. Will monitor.  Allergic  rhinitis-well controlled GERD-stable Check CBC and BMP  I have reviewed patient's medical records received at admission/from hospitalization.  CPT CODE: 46270

## 2013-10-20 NOTE — Progress Notes (Signed)
Patient ID: Guy [name], male   DOB: [birth year], 80 y.o.   MRN: [id number]               PROGRESS NOTE  DATE: 10/13/2013  FACILITY: Nursing Home Location: [facility]  LEVEL OF CARE: SNF (31)  Acute Visit  CHIEF COMPLAINT:  Follow-up hospitalization  HISTORY OF PRESENT ILLNESS: This is a 80 year old male who has been admitted to [facility] on 10/12/12 from [facility]. He fell at home and sustained an L1 compression fracture S/P vertebroplasty. He has been admitted for a short-term rehabilitation.  REASSESSMENT OF ONGOING PROBLEM(S):  HTN: Pt 's HTN remains stable.  Denies CP, sob, DOE, pedal edema, headaches, dizziness or visual disturbances.  No complications from the medications currently being used.  Last BP : 123/80  GERD: pt's GERD is stable.  Denies ongoing heartburn, abd. Pain, nausea or vomiting.  Currently on a PPI & tolerates it without any adverse reactions.  DEPRESSION: The depression remains stable. Patient denies ongoing feelings of sadness, insomnia, anedhonia or lack of appetite. No complications reported from the medications currently being used. Staff do not report behavioral problems.  PAST MEDICAL HISTORY : Reviewed.  No changes.  CURRENT MEDICATIONS: Reviewed per [facility]  REVIEW OF SYSTEMS:  GENERAL: no change in appetite, no fatigue, no weight changes, no fever, chills or weakness RESPIRATORY: no cough, SOB, DOE, wheezing, hemoptysis CARDIAC: no chest pain, edema or palpitations GI: no abdominal pain, diarrhea, constipation, heart burn, nausea or vomiting  PHYSICAL EXAMINATION  VS:  T98.7       P74      RR20      BP123/80     POX99 %     WT136 (Lb)  GENERAL: no acute distress, normal body habitus EYES: conjunctivae normal, sclerae normal, normal eye lids NECK: supple, trachea midline, no neck masses, no thyroid tenderness, no thyromegaly LYMPHATICS: no LAN in the neck, no supraclavicular LAN RESPIRATORY: breathing is even &  unlabored, BS CTAB CARDIAC: RRR, no murmur,no extra heart sounds, no edema GI: abdomen soft, normal BS, no masses, no tenderness, no hepatomegaly, no splenomegaly PSYCHIATRIC: the patient is alert & oriented to person, affect & behavior appropriate  LABS/RADIOLOGY: Labs reviewed: Basic Metabolic Panel:  Recent Labs  10/07/13 1233 10/07/13 1630  10/10/13 0500 10/11/13 0510 10/12/13 0500  NA 139  --   < > 136* 134* 136*  K 3.9  --   < > 4.4 3.8 4.3  CL 97  --   < > 99 98 100  CO2  --   --   < > 28 23 24   GLUCOSE 190*  --   < > 210* 102* 92  BUN 25*  --   < > 27* 24* 25*  CREATININE 1.40* 1.24  < > 1.53* 1.43* 1.44*  CALCIUM  --   --   < > 8.6 8.2* 8.4  MG  --  1.7  --   --   --   --   PHOS  --  3.3  --   --   --   --   < > = values in this interval not displayed. Liver Function Tests:  Recent Labs  01/04/13 1120 03/20/13 1834 03/21/13 0532  AST 27 52* 41*  ALT 14 23 20   ALKPHOS 113 126* 106  BILITOT 0.6 0.3 0.3  PROT 5.8* 7.3 6.5  ALBUMIN 1.8* 2.5* 2.2*    Recent Labs  11/17/12 0408  AMMONIA 33  CBC:  Recent Labs  06/24/13 1250  09/30/13 1555 10/07/13 1220  10/10/13 0500 10/11/13 0510 10/12/13 0500  WBC 6.1  --  11.1* 11.6*  < > 9.1 8.4 9.9  NEUTROABS 3.3  --  7.0 9.5*  --   --   --   --   HGB 10.3*  < > 13.9 13.4  < > 12.3* 11.9* 11.7*  HCT 31.1*  < > 40.2 39.0  < > 35.6* 35.4* 35.1*  MCV 94.0  --  95.7 96.3  < > 95.4 96.7 97.0  PLT 129*  --  185 123*  < > 131* 139* 145*  < > = values in this interval not displayed. Lipid Panel:  Recent Labs  12/30/12 0500  HDL 26*   Cardiac Enzymes:  Recent Labs  03/20/13 1834 03/20/13 2248 03/21/13 0532  TROPONINI <0.30 <0.30 <0.30   CBG:  Recent Labs  10/11/13 2051 10/12/13 0717 10/12/13 1352  GLUCAP 76 109* 145*     ASSESSMENT/PLAN:  L1 compression fracture status post vertebroplasty - for rehabilitation  Diabetes mellitus, type II - well-controlled; continue Lantus, NovoLog and  Amaryl  Hypertension - well-controlled; continue metoprolol and Norvasc   GERD - continue Prilosec  Urinary incontinence - continue Ditropan  Depression - stable; continue Zoloft  Hyperlipidemia - continue Simvastatin   CPT CODE: 16579

## 2013-10-25 ENCOUNTER — Non-Acute Institutional Stay (SKILLED_NURSING_FACILITY): Payer: Medicare Other | Admitting: Adult Health

## 2013-10-25 DIAGNOSIS — B372 Candidiasis of skin and nail: Secondary | ICD-10-CM

## 2013-10-30 DIAGNOSIS — B372 Candidiasis of skin and nail: Secondary | ICD-10-CM | POA: Insufficient documentation

## 2013-10-30 NOTE — Progress Notes (Signed)
Patient ID: Guy [name], male   DOB: 26-Dec-1919, 80 y.o.   MRN: [id number]          PROGRESS NOTE  DATE: 10/25/13  FACILITY: Nursing Home Location: [facility]  LEVEL OF CARE: SNF (31)  Acute Visit  CHIEF COMPLAINT:  Manage Candida skin infection  HISTORY OF PRESENT ILLNESS: This is a 80 year old male who complained of itching on his sacral area. Noted to have moist erythematous rashes on sacral area.   PAST MEDICAL HISTORY : Reviewed.  No changes.  CURRENT MEDICATIONS: Reviewed per [facility]  REVIEW OF SYSTEMS:  GENERAL: no change in appetite, no fatigue, no weight changes, no fever, chills or weakness RESPIRATORY: no cough, SOB, DOE, wheezing, hemoptysis CARDIAC: no chest pain, edema or palpitations GI: no abdominal pain, diarrhea, constipation, heart burn, nausea or vomiting  PHYSICAL EXAMINATION  VS:  T98.8       P84      RR20      BP130/78     POX97 %     WT138.6 (Lb)  GENERAL: no acute distress, normal body habitus NECK: supple, trachea midline, no neck masses, no thyroid tenderness, no thyromegaly LYMPHATICS: no LAN in the neck, no supraclavicular LAN RESPIRATORY: breathing is even & unlabored, BS CTAB CARDIAC: RRR, no murmur,no extra heart sounds, no edema GI: abdomen soft, normal BS, no masses, no tenderness, no hepatomegaly, no splenomegaly PSYCHIATRIC: the patient is alert & oriented to person, affect & behavior appropriate  LABS/RADIOLOGY: Labs reviewed: Basic Metabolic Panel:  Recent Labs  10/07/13 1233 10/07/13 1630  10/10/13 0500 10/11/13 0510 10/12/13 0500  NA 139  --   < > 136* 134* 136*  K 3.9  --   < > 4.4 3.8 4.3  CL 97  --   < > 99 98 100  CO2  --   --   < > 28 23 24   GLUCOSE 190*  --   < > 210* 102* 92  BUN 25*  --   < > 27* 24* 25*  CREATININE 1.40* 1.24  < > 1.53* 1.43* 1.44*  CALCIUM  --   --   < > 8.6 8.2* 8.4  MG  --  1.7  --   --   --   --   PHOS  --  3.3  --   --   --   --   < > = values in this interval not  displayed. Liver Function Tests:  Recent Labs  01/04/13 1120 03/20/13 1834 03/21/13 0532  AST 27 52* 41*  ALT 14 23 20   ALKPHOS 113 126* 106  BILITOT 0.6 0.3 0.3  PROT 5.8* 7.3 6.5  ALBUMIN 1.8* 2.5* 2.2*    Recent Labs  11/17/12 0408  AMMONIA 33   CBC:  Recent Labs  06/24/13 1250  09/30/13 1555 10/07/13 1220  10/10/13 0500 10/11/13 0510 10/12/13 0500  WBC 6.1  --  11.1* 11.6*  < > 9.1 8.4 9.9  NEUTROABS 3.3  --  7.0 9.5*  --   --   --   --   HGB 10.3*  < > 13.9 13.4  < > 12.3* 11.9* 11.7*  HCT 31.1*  < > 40.2 39.0  < > 35.6* 35.4* 35.1*  MCV 94.0  --  95.7 96.3  < > 95.4 96.7 97.0  PLT 129*  --  185 123*  < > 131* 139* 145*  < > = values in this interval not displayed. Lipid Panel:  Recent Labs  12/30/12 0500  HDL 26*   Cardiac Enzymes:  Recent Labs  03/20/13 1834 03/20/13 2248 03/21/13 0532  TROPONINI <0.30 <0.30 <0.30   CBG:  Recent Labs  10/11/13 2051 10/12/13 0717 10/12/13 1352  GLUCAP 76 109* 145*     ASSESSMENT/PLAN:  Candida, skin infection - Apply Nystatin powder 100,000 units/gm to sacral rashes BID x 3 weeks   CPT CODE: 50158

## 2013-11-12 ENCOUNTER — Encounter: Payer: Self-pay | Admitting: Adult Health

## 2013-11-12 ENCOUNTER — Non-Acute Institutional Stay (SKILLED_NURSING_FACILITY): Payer: Medicare Other | Admitting: Adult Health

## 2013-11-12 DIAGNOSIS — F3289 Other specified depressive episodes: Secondary | ICD-10-CM

## 2013-11-12 DIAGNOSIS — S32009A Unspecified fracture of unspecified lumbar vertebra, initial encounter for closed fracture: Secondary | ICD-10-CM

## 2013-11-12 DIAGNOSIS — E785 Hyperlipidemia, unspecified: Secondary | ICD-10-CM

## 2013-11-12 DIAGNOSIS — K219 Gastro-esophageal reflux disease without esophagitis: Secondary | ICD-10-CM

## 2013-11-12 DIAGNOSIS — E119 Type 2 diabetes mellitus without complications: Secondary | ICD-10-CM

## 2013-11-12 DIAGNOSIS — F329 Major depressive disorder, single episode, unspecified: Secondary | ICD-10-CM

## 2013-11-12 DIAGNOSIS — I10 Essential (primary) hypertension: Secondary | ICD-10-CM

## 2013-11-12 DIAGNOSIS — R32 Unspecified urinary incontinence: Secondary | ICD-10-CM

## 2013-11-12 DIAGNOSIS — S32010A Wedge compression fracture of first lumbar vertebra, initial encounter for closed fracture: Secondary | ICD-10-CM

## 2013-11-12 DIAGNOSIS — F32A Depression, unspecified: Secondary | ICD-10-CM

## 2013-11-12 NOTE — Progress Notes (Signed)
Patient ID: Guy [name], male   DOB: 1920-01-22, 80 y.o.   MRN: [id number]                 PROGRESS NOTE  DATE:  11/12/13  FACILITY: Nursing Home Location: [facility]  LEVEL OF CARE: SNF (31)  Acute Visit  CHIEF COMPLAINT:  Discharge Notes  HISTORY OF PRESENT ILLNESS: This is a 80 year old male who is for discharge home with Home health PT, OT and Nursing. He has been admitted to [facility] on 10/12/12 from [facility]. He fell at home and sustained an L1 compression fracture S/P vertebroplasty. Patient was admitted to this facility for short-term rehabilitation after the patient's recent hospitalization. Patient has completed SNF rehabilitation and therapy has cleared the patient for discharge.   REASSESSMENT OF ONGOING PROBLEM(S):  HTN: Pt 's HTN remains stable.  Denies CP, sob, DOE, pedal edema, headaches, dizziness or visual disturbances.  No complications from the medications currently being used.  Last BP : 126/81  HYPERLIPIDEMIA: No complications from the medications presently being used. 4/14 fasting lipid panel showed : chol 80   Trig 88  HDL 26  LDL 37  DM:pt's DM remains stable.  Pt denies polyuria, polydipsia, polyphagia, changes in vision or hypoglycemic episodes.  No complications noted from the medication presently being used.   6/14   hemoglobin A1c is:7.4  PAST MEDICAL HISTORY : Reviewed.  No changes.  CURRENT MEDICATIONS: Reviewed per [facility]  REVIEW OF SYSTEMS:  GENERAL: no change in appetite, no fatigue, no weight changes, no fever, chills or weakness RESPIRATORY: no cough, SOB, DOE, wheezing, hemoptysis CARDIAC: no chest pain, edema or palpitations GI: no abdominal pain, diarrhea, constipation, heart burn, nausea or vomiting  PHYSICAL EXAMINATION  VS:  T96.7      P96     RR20      BP126/81    POX99 %     WT136 (Lb)  GENERAL: no acute distress, normal body habitus NECK: supple, trachea midline, no neck masses, no thyroid  tenderness, no thyromegaly RESPIRATORY: breathing is even & unlabored, BS CTAB CARDIAC: RRR, no murmur,no extra heart sounds, no edema GI: abdomen soft, normal BS, no masses, no tenderness, no hepatomegaly, no splenomegaly PSYCHIATRIC: the patient is alert & oriented to person, affect & behavior appropriate  LABS/RADIOLOGY: Labs reviewed: 10/18/13  Wbc 7.3  hgb 12.3  hct 38.4  NA137   K4.3   Glucose119   BUN26  Creatinine 1.5  CA 8.9 Basic Metabolic Panel:  Recent Labs  10/07/13 1233 10/07/13 1630  10/10/13 0500 10/11/13 0510 10/12/13 0500  NA 139  --   < > 136* 134* 136*  K 3.9  --   < > 4.4 3.8 4.3  CL 97  --   < > 99 98 100  CO2  --   --   < > 28 23 24   GLUCOSE 190*  --   < > 210* 102* 92  BUN 25*  --   < > 27* 24* 25*  CREATININE 1.40* 1.24  < > 1.53* 1.43* 1.44*  CALCIUM  --   --   < > 8.6 8.2* 8.4  MG  --  1.7  --   --   --   --   PHOS  --  3.3  --   --   --   --   < > = values in this interval not displayed. Liver Function Tests:  Recent Labs  01/04/13 1120  03/20/13 1834 03/21/13 0532  AST 27 52* 41*  ALT 14 23 20   ALKPHOS 113 126* 106  BILITOT 0.6 0.3 0.3  PROT 5.8* 7.3 6.5  ALBUMIN 1.8* 2.5* 2.2*    Recent Labs  11/17/12 0408  AMMONIA 33   CBC:  Recent Labs  06/24/13 1250  09/30/13 1555 10/07/13 1220  10/10/13 0500 10/11/13 0510 10/12/13 0500  WBC 6.1  --  11.1* 11.6*  < > 9.1 8.4 9.9  NEUTROABS 3.3  --  7.0 9.5*  --   --   --   --   HGB 10.3*  < > 13.9 13.4  < > 12.3* 11.9* 11.7*  HCT 31.1*  < > 40.2 39.0  < > 35.6* 35.4* 35.1*  MCV 94.0  --  95.7 96.3  < > 95.4 96.7 97.0  PLT 129*  --  185 123*  < > 131* 139* 145*  < > = values in this interval not displayed. Lipid Panel:  Recent Labs  12/30/12 0500  HDL 26*   Cardiac Enzymes:  Recent Labs  03/20/13 1834 03/20/13 2248 03/21/13 0532  TROPONINI <0.30 <0.30 <0.30   CBG:  Recent Labs  10/11/13 2051 10/12/13 0717 10/12/13 1352  GLUCAP 76 109* 145*      ASSESSMENT/PLAN:  L1 compression fracture status post vertebroplasty - for Home health PT, OT and Nursing  Diabetes mellitus, type II - well-controlled; continue Lantus, NovoLog and Amaryl  Hypertension - well-controlled; continue metoprolol and Norvasc   GERD - continue Prilosec  Urinary incontinence - continue Ditropan  Depression - stable; continue Zoloft  Hyperlipidemia - continue Simvastatin    I have filled out patient's discharge paperwork and written prescriptions.  Patient will receive home health PT, OT and Nursing.  Total discharge time: Less than 30 minutes Discharge time involved coordination of the discharge process with Education officer, museum, nursing staff and therapy department. Medical justification for home health services verified.   CPT CODE: 27782

## 2013-12-27 ENCOUNTER — Encounter (HOSPITAL_COMMUNITY): Payer: Self-pay | Admitting: Emergency Medicine

## 2013-12-27 ENCOUNTER — Ambulatory Visit (INDEPENDENT_AMBULATORY_CARE_PROVIDER_SITE_OTHER): Payer: Medicare Other | Admitting: Cardiology

## 2013-12-27 ENCOUNTER — Emergency Department (HOSPITAL_COMMUNITY): Payer: Medicare Other

## 2013-12-27 ENCOUNTER — Emergency Department (HOSPITAL_COMMUNITY)
Admission: EM | Admit: 2013-12-27 | Discharge: 2013-12-28 | Disposition: A | Discharge status: Home / Self Care | Payer: Medicare Other | Attending: Emergency Medicine | Admitting: Emergency Medicine

## 2013-12-27 VITALS — BP 142/68 | HR 58 | Ht 66.0 in | Wt 132.0 lb

## 2013-12-27 DIAGNOSIS — M899 Disorder of bone, unspecified: Secondary | ICD-10-CM | POA: Insufficient documentation

## 2013-12-27 DIAGNOSIS — S0990XA Unspecified injury of head, initial encounter: Secondary | ICD-10-CM | POA: Insufficient documentation

## 2013-12-27 DIAGNOSIS — Y9389 Activity, other specified: Secondary | ICD-10-CM | POA: Insufficient documentation

## 2013-12-27 DIAGNOSIS — IMO0002 Reserved for concepts with insufficient information to code with codable children: Secondary | ICD-10-CM | POA: Insufficient documentation

## 2013-12-27 DIAGNOSIS — I2510 Atherosclerotic heart disease of native coronary artery without angina pectoris: Secondary | ICD-10-CM | POA: Insufficient documentation

## 2013-12-27 DIAGNOSIS — S79929A Unspecified injury of unspecified thigh, initial encounter: Secondary | ICD-10-CM

## 2013-12-27 DIAGNOSIS — Z794 Long term (current) use of insulin: Secondary | ICD-10-CM | POA: Insufficient documentation

## 2013-12-27 DIAGNOSIS — S298XXA Other specified injuries of thorax, initial encounter: Secondary | ICD-10-CM | POA: Insufficient documentation

## 2013-12-27 DIAGNOSIS — M8580 Other specified disorders of bone density and structure, unspecified site: Secondary | ICD-10-CM

## 2013-12-27 DIAGNOSIS — Z87891 Personal history of nicotine dependence: Secondary | ICD-10-CM | POA: Insufficient documentation

## 2013-12-27 DIAGNOSIS — W1811XA Fall from or off toilet without subsequent striking against object, initial encounter: Secondary | ICD-10-CM | POA: Insufficient documentation

## 2013-12-27 DIAGNOSIS — S199XXA Unspecified injury of neck, initial encounter: Secondary | ICD-10-CM

## 2013-12-27 DIAGNOSIS — E785 Hyperlipidemia, unspecified: Secondary | ICD-10-CM | POA: Insufficient documentation

## 2013-12-27 DIAGNOSIS — F3289 Other specified depressive episodes: Secondary | ICD-10-CM | POA: Insufficient documentation

## 2013-12-27 DIAGNOSIS — Z8673 Personal history of transient ischemic attack (TIA), and cerebral infarction without residual deficits: Secondary | ICD-10-CM | POA: Insufficient documentation

## 2013-12-27 DIAGNOSIS — Z880 Allergy status to penicillin: Secondary | ICD-10-CM | POA: Insufficient documentation

## 2013-12-27 DIAGNOSIS — I10 Essential (primary) hypertension: Secondary | ICD-10-CM | POA: Insufficient documentation

## 2013-12-27 DIAGNOSIS — Z8551 Personal history of malignant neoplasm of bladder: Secondary | ICD-10-CM | POA: Insufficient documentation

## 2013-12-27 DIAGNOSIS — K219 Gastro-esophageal reflux disease without esophagitis: Secondary | ICD-10-CM | POA: Insufficient documentation

## 2013-12-27 DIAGNOSIS — Z7901 Long term (current) use of anticoagulants: Secondary | ICD-10-CM | POA: Insufficient documentation

## 2013-12-27 DIAGNOSIS — S0993XA Unspecified injury of face, initial encounter: Secondary | ICD-10-CM | POA: Insufficient documentation

## 2013-12-27 DIAGNOSIS — N390 Urinary tract infection, site not specified: Secondary | ICD-10-CM | POA: Insufficient documentation

## 2013-12-27 DIAGNOSIS — S32009A Unspecified fracture of unspecified lumbar vertebra, initial encounter for closed fracture: Secondary | ICD-10-CM | POA: Insufficient documentation

## 2013-12-27 DIAGNOSIS — F329 Major depressive disorder, single episode, unspecified: Secondary | ICD-10-CM | POA: Insufficient documentation

## 2013-12-27 DIAGNOSIS — W1809XA Striking against other object with subsequent fall, initial encounter: Secondary | ICD-10-CM | POA: Insufficient documentation

## 2013-12-27 DIAGNOSIS — C7800 Secondary malignant neoplasm of unspecified lung: Secondary | ICD-10-CM | POA: Insufficient documentation

## 2013-12-27 DIAGNOSIS — Y92009 Unspecified place in unspecified non-institutional (private) residence as the place of occurrence of the external cause: Secondary | ICD-10-CM | POA: Insufficient documentation

## 2013-12-27 DIAGNOSIS — Z8546 Personal history of malignant neoplasm of prostate: Secondary | ICD-10-CM | POA: Insufficient documentation

## 2013-12-27 DIAGNOSIS — Z8619 Personal history of other infectious and parasitic diseases: Secondary | ICD-10-CM | POA: Insufficient documentation

## 2013-12-27 DIAGNOSIS — Z9889 Other specified postprocedural states: Secondary | ICD-10-CM | POA: Insufficient documentation

## 2013-12-27 DIAGNOSIS — M810 Age-related osteoporosis without current pathological fracture: Secondary | ICD-10-CM | POA: Insufficient documentation

## 2013-12-27 DIAGNOSIS — Z9861 Coronary angioplasty status: Secondary | ICD-10-CM | POA: Insufficient documentation

## 2013-12-27 DIAGNOSIS — Z79899 Other long term (current) drug therapy: Secondary | ICD-10-CM | POA: Insufficient documentation

## 2013-12-27 DIAGNOSIS — M1990 Unspecified osteoarthritis, unspecified site: Secondary | ICD-10-CM | POA: Insufficient documentation

## 2013-12-27 DIAGNOSIS — S79919A Unspecified injury of unspecified hip, initial encounter: Secondary | ICD-10-CM | POA: Insufficient documentation

## 2013-12-27 DIAGNOSIS — M949 Disorder of cartilage, unspecified: Secondary | ICD-10-CM

## 2013-12-27 DIAGNOSIS — E119 Type 2 diabetes mellitus without complications: Secondary | ICD-10-CM | POA: Insufficient documentation

## 2013-12-27 LAB — CBC WITH DIFFERENTIAL/PLATELET
Basophils Absolute: 0 10*3/uL (ref 0.0–0.1)
Basophils Relative: 0 % (ref 0–1)
Eosinophils Absolute: 0.3 10*3/uL (ref 0.0–0.7)
Eosinophils Relative: 2 % (ref 0–5)
HEMATOCRIT: 41.1 % (ref 39.0–52.0)
HEMOGLOBIN: 13.9 g/dL (ref 13.0–17.0)
Lymphocytes Relative: 8 % — ABNORMAL LOW (ref 12–46)
Lymphs Abs: 0.9 10*3/uL (ref 0.7–4.0)
MCH: 32.3 pg (ref 26.0–34.0)
MCHC: 33.8 g/dL (ref 30.0–36.0)
MCV: 95.6 fL (ref 78.0–100.0)
MONOS PCT: 6 % (ref 3–12)
Monocytes Absolute: 0.7 10*3/uL (ref 0.1–1.0)
NEUTROS ABS: 9.9 10*3/uL — AB (ref 1.7–7.7)
NEUTROS PCT: 84 % — AB (ref 43–77)
Platelets: 132 10*3/uL — ABNORMAL LOW (ref 150–400)
RBC: 4.3 MIL/uL (ref 4.22–5.81)
RDW: 13.2 % (ref 11.5–15.5)
WBC: 11.8 10*3/uL — ABNORMAL HIGH (ref 4.0–10.5)

## 2013-12-27 LAB — BASIC METABOLIC PANEL
BUN: 24 mg/dL — AB (ref 6–23)
CHLORIDE: 97 meq/L (ref 96–112)
CO2: 24 mEq/L (ref 19–32)
Calcium: 10.2 mg/dL (ref 8.4–10.5)
Creatinine, Ser: 1.39 mg/dL — ABNORMAL HIGH (ref 0.50–1.35)
GFR calc Af Amer: 49 mL/min — ABNORMAL LOW (ref >90)
GFR calc non Af Amer: 43 mL/min — ABNORMAL LOW (ref >90)
Glucose, Bld: 159 mg/dL — ABNORMAL HIGH (ref 70–99)
POTASSIUM: 4 meq/L (ref 3.7–5.3)
Sodium: 135 mEq/L — ABNORMAL LOW (ref 137–147)

## 2013-12-27 LAB — URINALYSIS, ROUTINE W REFLEX MICROSCOPIC
BILIRUBIN URINE: NEGATIVE
GLUCOSE, UA: NEGATIVE mg/dL
Ketones, ur: NEGATIVE mg/dL
Nitrite: POSITIVE — AB
PH: 7.5 (ref 5.0–8.0)
Protein, ur: 100 mg/dL — AB
SPECIFIC GRAVITY, URINE: 1.015 (ref 1.005–1.030)
Urobilinogen, UA: 0.2 mg/dL (ref 0.0–1.0)

## 2013-12-27 LAB — URINE MICROSCOPIC-ADD ON

## 2013-12-27 MED ORDER — CIPROFLOXACIN IN D5W 400 MG/200ML IV SOLN
400.0000 mg | Freq: Once | INTRAVENOUS | Status: AC
  Administered 2013-12-28: 400 mg via INTRAVENOUS
  Filled 2013-12-27: qty 200

## 2013-12-27 MED ORDER — MORPHINE SULFATE 2 MG/ML IJ SOLN
2.0000 mg | Freq: Once | INTRAMUSCULAR | Status: AC
  Administered 2013-12-27: 2 mg via INTRAVENOUS
  Filled 2013-12-27: qty 1

## 2013-12-27 NOTE — ED Provider Notes (Signed)
TIME SEEN: 9:55 PM  CHIEF COMPLAINT: Fall, head injury, back pain  HPI: Patient is a 80 year old male with history of hypertension, diabetes, hyperlipidemia, prior CVA, CAD with stent to his LAD, multiple recent falls with lumbar compression fracture status post kyphoplasty to his L1 and L3 vertebra who presents to the emergency department after a fall today. Patient reports that he normally ambulates with a walker. He states that he has had trouble with balance for several years. He states that when he was getting off the toilet tonight when he fell backward striking his head. He states he is not sure what caused him to fall. There is no loss of consciousness. He is on anticoagulation. He is complaining of neck pain, lower back pain, right-sided chest wall pain, right hip pain. He denies any chest pain, palpitations, dizziness or shortness of breath that led to his fall. No new numbness, tingling or focal weakness. Patient lives with his elderly wife.  ROS: See HPI Constitutional: no fever  Eyes: no drainage  ENT: no runny nose   Cardiovascular:  no chest pain  Resp: no SOB  GI: no vomiting GU: no dysuria Integumentary: no rash  Allergy: no hives  Musculoskeletal: no leg swelling  Neurological: no slurred speech ROS otherwise negative  PAST MEDICAL HISTORY/PAST SURGICAL HISTORY:  Past Medical History  Diagnosis Date  . Hypertension   . Diabetes mellitus without complication   . Arthritis   . Sciatica   . GERD (gastroesophageal reflux disease)   . Urge incontinence   . Vertigo   . Hyperlipidemia   . ED (erectile dysfunction)   . Gastric polyposis   . Osteoporosis   . Decreased libido   . Depression   . Hepatitis A   . Stroke   . History of blood transfusion     1946  . OA (osteoarthritis)   . Bladder cancer   . Prostate cancer     S/P prostatectomy; Lung metastasis  . CAD (coronary artery disease) 1996    stent to the LAD   . Hyperlipidemia   . Type 2 diabetes mellitus      MEDICATIONS:  Prior to Admission medications   Medication Sig Start Date End Date Taking? Authorizing Provider  amLODipine (NORVASC) 10 MG tablet Take 10 mg by mouth daily.    Historical Provider, MD  bicalutamide (CASODEX) 50 MG tablet Take 50 mg by mouth daily.    Historical Provider, MD  Calcium Carbonate-Vitamin D (CALCIUM 600+D HIGH POTENCY) 600-400 MG-UNIT per tablet Take 1 tablet by mouth 2 (two) times daily.      Historical Provider, MD  carboxymethylcellulose (REFRESH PLUS) 0.5 % SOLN Place 1 drop into both eyes 2 (two) times daily as needed (for dry eyes).    Historical Provider, MD  docusate sodium (COLACE) 100 MG capsule Take 200 mg by mouth daily.     Historical Provider, MD  fluticasone (FLONASE) 50 MCG/ACT nasal spray Place 2 sprays into the nose 2 (two) times daily.    Historical Provider, MD  glimepiride (AMARYL) 4 MG tablet Take 4 mg by mouth daily before breakfast.    Historical Provider, MD  HYDROcodone-acetaminophen (NORCO/VICODIN) 5-325 MG per tablet Take one to two tablets by mouth every 4 hours as needed for moderate pain 10/13/13   Pricilla Larsson, NP  hydrOXYzine (ATARAX/VISTARIL) 25 MG tablet Take 25 mg by mouth 2 (two) times daily.     Historical Provider, MD  insulin aspart (NOVOLOG) 100 UNIT/ML injection Inject 0-6 Units  into the skin 3 (three) times daily before meals. 0-150=0; 151-200=2u; 201-250=3u; 251-300=4u; 301-350=5u; over 350 =6u prior to meals 01/18/13   Gerlene Fee, NP  insulin glargine (LANTUS) 100 UNIT/ML injection Inject 16 Units into the skin at bedtime.     Historical Provider, MD  lipase/protease/amylase (CREON-12/PANCREASE) 12000 UNITS CPEP Take 4 capsules by mouth 3 (three) times daily.    Historical Provider, MD  Melatonin 3 MG TABS Take 3 mg by mouth at bedtime.    Historical Provider, MD  metoprolol (LOPRESSOR) 50 MG tablet Take 50 mg by mouth daily.    Historical Provider, MD  Multiple Vitamins-Minerals (MULTIVITAMINS THER. W/MINERALS)  TABS Take 1 tablet by mouth every morning.     Historical Provider, MD  nitroGLYCERIN (NITROSTAT) 0.4 MG SL tablet Place 0.4 mg under the tongue every 5 (five) minutes as needed for chest pain.     Historical Provider, MD  oxybutynin (DITROPAN) 5 MG tablet Take 5 mg by mouth at bedtime.    Historical Provider, MD  sertraline (ZOLOFT) 100 MG tablet Take 100 mg by mouth daily.    Historical Provider, MD  simvastatin (ZOCOR) 40 MG tablet Take 40 mg by mouth at bedtime.    Historical Provider, MD    ALLERGIES:  Allergies  Allergen Reactions  . Albuterol Sulfate Hfa [Kdc:Albuterol] Shortness Of Breath and Swelling  . Adhesive [Tape] Other (See Comments)    REACTION: SKIN BLISTERS  . Albuterol Swelling    Per pt., side of his neck began to swell with nebulized Albuterol in doctor's office.  . Amlodipine Other (See Comments)    Reaction unknown  . Contrast Media [Iodinated Diagnostic Agents] Other (See Comments)    Reaction unknown  . Lactose     Other reaction(s): GI Upset (intolerance)  . Metrizamide Other (See Comments)    Reaction unknown  . Norvasc [Amlodipine Besylate] Other (See Comments)    Reaction unknown  . Penicillins Other (See Comments) and Itching    REACTION: ITCHING HANDS  . Sulfa Drugs Cross Reactors Hives  . Sulfamethoxazole Rash    SOCIAL HISTORY:  History  Substance Use Topics  . Smoking status: Former Research scientist (life sciences)  . Smokeless tobacco: Never Used  . Alcohol Use: No    FAMILY HISTORY: Family History  Problem Relation Age of Onset  . Heart failure Mother   . Bladder Cancer Father   . Pancreatic cancer Sister   . Lung cancer Sister   . Breast cancer Daughter   . Liver disease Son     EXAM: BP 185/74  Pulse 74  Temp(Src) 98.8 F (37.1 C) (Oral)  Resp 20  SpO2 92% CONSTITUTIONAL: Alert and oriented and responds appropriately to questions. elderly, appears uncomfortable, GCS 15 HEAD: Normocephalic; atraumatic EYES: Conjunctivae clear, PERRL, EOMI ENT:  normal nose; no rhinorrhea; moist mucous membranes; pharynx without lesions noted; no dental injury; no septal hematoma NECK: Supple, no meningismus, no LAD; tender to palpation diffusely throughout the cervical spine without step-off or deformity, c-collar in place CARD: RRR; S1 and S2 appreciated; no murmurs, no clicks, no rubs, no gallops RESP: Normal chest excursion without splinting or tachypnea; breath sounds clear and equal bilaterally; no wheezes, no rhonchi, no rales; chest wall stable,  right chest wall is tender to palpation without crepitus or ecchymosis or deformity  ABD/GI: Normal bowel sounds; non-distended; soft, non-tender, no rebound, no guarding; patient has chronic indwelling Foley catheter  PELVIS:  stable, nontender to palpation BACK:  The back appears normal and is  diffusely tender to palpation throughout the thoracic and lumbar spine, there is no CVA tenderness; no step-off or deformity EXT:  patient is tender to palpation over his right hip and has pain with internal and external rotation but no leg length discrepancy, 2+ DP pulses bilaterally, otherwiseNormal ROM in all joints; non-tender to palpation; no edema; normal capillary refill; no cyanosis    SKIN: Normal color for age and race; warm NEURO: Moves all extremities equally sensation to light touch intact diffusely, cranial nerves II through XII intact PSYCH: The patient's mood and manner are appropriate. Grooming and personal hygiene are appropriate.  MEDICAL DECISION MAKING:  Patient here with unwitnessed fall.Burnis Medin obtain CT imaging of his head, spine, chest x-ray and right hip x-ray. We'll obtain labs and urine.   Will give IV pain medication.   ED PROGRESS: Patient is nitrite positive urinary tract infection. He is growing Klebsiella, Escherichia coli and Citrobacter in the past year that have all been sensitive to Cipro. Urine culture pending. Will give dose of IV Cipro. Head and cervical spine CT showed no acute  abnormality. Chest x-ray and pelvis x-ray also negative. Patient appears to have an acute nondisplaced right L3 transverse process fracture.   He has chronic T2 and T3 burst fractures and remote L1 and L3 compression fractures. He has severe osteopenia. We'll attempt to ambulate patient with a walker.   12:10 AM  Pt was able to ambulate with a walker without any difficulty. Patient and his wife are comfortable with plan for discharge home. We'll give pain medication, Cipro for his UTI. Have discussed the need for strict bowel regimen while on narcotics. Have given strict return precautions. Patient and his wife verbalize understanding and are comfortable with this plan.   EKG Interpretation  Date/Time:  Monday December 27 2013 22:13:35 EDT Ventricular Rate:  75 PR Interval:  94 QRS Duration: 142 QT Interval:  470 QTC Calculation: 525 R Axis:   -85 Text Interpretation:  Sinus tachycardia Artifact Non-specific intra-ventricular conduction delay No significant change since last tracing Confirmed by Ernst Cumpston,  DO, Miyo Aina ([id number]) on 12/27/2013 11:00:32 PM        [place], DO 12/28/13 0010

## 2013-12-27 NOTE — ED Notes (Signed)
Bed: WA07 Expected date:  Expected time:  Means of arrival:  Comments: EMS 80yo M fall, low back pain

## 2013-12-27 NOTE — ED Notes (Signed)
Per EMS pt fell in bathroom around 8 pm tonight, no LOC, tripped/lost balance, lives w/ wife at home, wife helped up and put in bed, complaining of lower back pain, has broken 2 vertebrae in past, 50 mcg fentanyl given w/ some relief, 178/90, 78 HR , RR 16, CBG 171.

## 2013-12-27 NOTE — Patient Instructions (Signed)
We are going to consult with Dr. Gwenlyn Found and decide the best course of action to treat your most recent  Episode of CP in an effort to clear you for your dental procedure; we will get in contact with you soon and let you know what decision has been made

## 2013-12-28 MED ORDER — DOCUSATE SODIUM 100 MG PO CAPS: 100.0000 mg | ORAL_CAPSULE | Freq: Two times a day (BID) | ORAL | Status: DC

## 2013-12-28 MED ORDER — CIPROFLOXACIN HCL 500 MG PO TABS: 500.0000 mg | ORAL_TABLET | Freq: Two times a day (BID) | ORAL | Status: DC

## 2013-12-28 MED ORDER — HYDROCODONE-ACETAMINOPHEN 5-325 MG PO TABS: 1.0000 | ORAL_TABLET | Freq: Four times a day (QID) | ORAL | Status: DC | PRN

## 2013-12-28 NOTE — Discharge Instructions (Signed)
Head Injury, Adult °You have received a head injury. It does not appear serious at this time. Headaches and vomiting are common following head injury. It should be easy to awaken from sleeping. Sometimes it is necessary for you to stay in the emergency department for a while for observation. Sometimes admission to the hospital may be needed. After injuries such as yours, most problems occur within the first 24 hours, but side effects may occur up to 7 10 days after the injury. It is important for you to carefully monitor your condition and contact your health care provider or seek immediate medical care if there is a change in your condition. °WHAT ARE THE TYPES OF HEAD INJURIES? °Head injuries can be as minor as a bump. Some head injuries can be more severe. More severe head injuries include: °· A jarring injury to the brain (concussion). °· A bruise of the brain (contusion). This mean there is bleeding in the brain that can cause swelling. °· A cracked skull (skull fracture). °· Bleeding in the brain that collects, clots, and forms a bump (hematoma). °WHAT CAUSES A HEAD INJURY? °A serious head injury is most likely to happen to someone who is in a car wreck and is not wearing a seat belt. Other causes of major head injuries include bicycle or motorcycle accidents, sports injuries, and falls. °HOW ARE HEAD INJURIES DIAGNOSED? °A complete history of the event leading to the injury and your current symptoms will be helpful in diagnosing head injuries. Many times, pictures of the brain, such as CT or MRI are needed to see the extent of the injury. Often, an overnight hospital stay is necessary for observation.  °WHEN SHOULD I SEEK IMMEDIATE MEDICAL CARE?  °You should get help right away if: °· You have confusion or drowsiness. °· You feel sick to your stomach (nauseous) or have continued, forceful vomiting. °· You have dizziness or unsteadiness that is getting worse. °· You have severe, continued headaches not  relieved by medicine. Only take over-the-counter or prescription medicines for pain, fever, or discomfort as directed by your health care provider. °· You do not have normal function of the arms or legs or are unable to walk. °· You notice changes in the black spots in the center of the colored part of your eye (pupil). °· You have a clear or bloody fluid coming from your nose or ears. °· You have a loss of vision. °During the next 24 hours after the injury, you must stay with someone who can watch you for the warning signs. This person should contact local emergency services (911 in the [place] if you have seizures, you become unconscious, or you are unable to wake up. °HOW CAN I PREVENT A HEAD INJURY IN THE FUTURE? °The most important factor for preventing major head injuries is avoiding motor vehicle accidents.  To minimize the potential for damage to your head, it is crucial to wear seat belts while riding in motor vehicles. Wearing helmets while bike riding and playing collision sports (like football) is also helpful. Also, avoiding dangerous activities around the house will further help reduce your risk of head injury.  °WHEN CAN I RETURN TO NORMAL ACTIVITIES AND ATHLETICS? °You should be reevaluated by your health care provider before returning to these activities. If you have any of the following symptoms, you should not return to activities or contact sports until 1 week after the symptoms have stopped: °· Persistent headache. °· Dizziness or vertigo. °· Poor attention and concentration. °·   Confusion.  Memory problems.  Nausea or vomiting.  Fatigue or tire easily.  Irritability.  Intolerant of bright lights or loud noises.  Anxiety or depression.  Disturbed sleep. MAKE SURE YOU:   Understand these instructions.  Will watch your condition.  Will get help right away if you are not doing well or get worse. Document Released: 09/16/2005 Document Revised: 07/07/2013 Document Reviewed:  05/24/2013 [facility] Patient Information 2014 [place].  Lumbar Fracture A fracture of a bone is the same as a break in the bone. A fracture in the lumbar area is a break that involves one of many parts that make up the 5 bones of the low back area. This is just above the pelvis.  CAUSES Most of these injuries occur as a result of an accident such as:  A fall.  A car accident.  Recreational activities.  A smaller number occur due to:  Industrial, farm, and aviation accidents.  Gunshot wounds and direct blows to the back.  Parachuting incidents. Most lumbar fractures affect the "building blocks" or the main portion of the spine known as the "vertebral bodies" (see the image on the right). A smaller number involve breaks to portions of bone that extend to the sides or backward behind the vertebral body. In the elderly, a sudden break can happen without an apparent cause. This is because the bones of the back have become extremely thin and fragile. This condition is known as osteoporosis. SYMPTOMS Patients with lumbar fractures have severe pain even if the actual break is small or limited, and there is no injury to nearby nerves. More severe or complex injuries involving other bones and/or organs may include:   Deformity of the back bones.  Swelling/bruising over the injured area.  Limited ability to move the affected area.  Partial or complete loss of function of the bladder and/or bowels. (This may be due to injury to nearby nerves).  More severe injuries can also cause:  Loss of sensation and/or strength in the legs, feet, and toes.  Paralysis. DIAGNOSIS In most cases, a lumbar fracture will be suspected by what happened just prior to the onset of back pain. X-rays and special imaging (CT scan and MRI imaging) are used to confirm the diagnosis as well as finding out the type and severity of the break or breaks. These tests guide treatment. But there are times when special  imaging cannot be done. For example, MRI cannot be done if there is an implanted metallic device (such as a pacemaker). In these cases, other tests and imaging are done. If there has been nerve damage, more tests can be done. These include:  Tests of nerve function through muscles (nerve conduction studies and electromyography).  Tests of bladder function (urodynamics).  Tests that focus on defining specific nerve problems before surgery and what improvement has come about after surgery (evoked potentials). TREATMENT Common injuries may involve a small break off of the main surface of the back bone. Or they may be in the form of a partial flattening or compression of the bone. Hospital care may not be needed for these. Medicine for pain control, special back bracing, and limitations in activity are done first. Physical therapy follows later. Complex breaks, multiple fractures of the spine, or unstable injuries can damage the spinal cord. They may require an operation to remove pressure from the nerves and/or spinal cord and to stabilize the broken pieces of bone. Each individual set of injuries is unique. The surgeon will take  into consideration many things when planning the best surgical approach that will give the highest likelihood of a good outcome.  HOME CARE INSTRUCTIONS There is pain and stiffness in the back for weeks after a vertebral fracture. Bed rest, pain medicine, and a slow return to activity are generally recommended. Neck and back braces may be helpful in reducing pain and increasing mobility. When your pain allows, simple walking will help to begin the process of returning to normal activities. Exercises to improve motion and to strengthen the back may also be useful after the initial pain goes away. This will be guided by your caregiver and the team (nurses, physical therapists, occupational therapists, etc.) involved with your ongoing care. For the elderly, treatment for osteoporosis  may be needed to help reduce the risk of fractures in the future. Arrange for follow-up care as recommended to assure proper long-term care and prevention of further spine injury. The failure to follow-up as recommended could result in permanent injury, disability, and a chronic painful condition. SEEK MEDICAL CARE IF:  Pain is not effectively controlled with medication.  You feel unable to decrease pain medication over time as planned.  Activity level is not improving as planned and/or expected. SEEK IMMEDIATE MEDICAL CARE IF:  You have increasing pain, vomiting, or are unable to move around at all.  You have numbness, tingling, weakness, or paralysis of any part of your body.  You have loss of normal bowel or bladder control.  You have difficulty breathing, cough, fever, chest or abdominal pain. Document Released: 01/01/2007 Document Revised: 12/09/2011 Document Reviewed: 09/01/2007 [facility] Patient Information 2014 [place], [place].  Transverse Process Fracture A fracture of a bone is the same as a break in the bone. A fracture of a transverse process is a break of a part of one of the bones in the spine. This part extends out from the side of the main body of the bone (called the vertebral body). A transverse process is shaped like a wing. They extend from both the left and right sides of the vertebral body. Many of these injuries occur in the thoracic spine (the upper and middle parts of the back) and the lumbar spine (the low back area). In the elderly, these injuries can also occur in the lower neck area and are affected by age-related arthritis problems and osteoporosis (thinning of bone). It takes a lot of force to cause this type of fracture. Because other organs and so many other parts of the spine are close to the transverse processes, these fractures usually occur at the same time as injuries to:  Other bones.  Organs.  Possibly the spinal cord. Even if there is only a  break to one transverse process, your caregiver will take steps to make sure that a nearby organ has not also been injured.  CAUSES  Most of these injuries occur as a result of a variety of accidents such as:  Falls.  Motor vehicle accidents.  Recreational activities.  A smaller number occur due to:  Industrial, Engineer, maintenance, and aviation accidents.  Gunshot wounds and direct blows to the back.  Parachuting incidents. SYMPTOMS  Patients with transverse process fractures have severe pain even if the actual break is small or limited and there is no injury to nearby bones, organs, or the spinal cord. More severe or complex injuries involving other bones and/or organs may include:  Deformity of the back bones.  Swelling/bruising over the injured area.  Limited ability to move the affected area.  There may be injury to a nearby:  Lung.  Kidney.  Spleen.  Liver. Injury to nearby nerves can cause partial or complete loss of function of the bladder and/or bowels. More severe injuries can also cause:  Loss of sensation and/or strength in the arms/hands (if the break is in the lower part of the neck), legs, feet, and toes.  Spinal cord injury that leads to paralysis. DIAGNOSIS In most cases, a broken bone will be suspected by what happened just prior to the onset of back and/or neck pain. X-rays and special imaging (CT scan and MRI imaging) are used to confirm the diagnosis as well as finding out the type and severity of the break or breaks. These tests are important for guiding and planning treatment. There are times when special imaging cannot be done. MRI cannot be done if there is an implanted metallic device (such as a pacemaker). In these cases, other tests and imaging are done. Other tests may be done if your caregiver is concerned about injuries to internal organs near the break of the transverse process. For example, an ultrasound may be ordered to examine the liver, spleen,  or kidneys. If there has been nerve damage near the break, additional tests may be ordered in order to find out:  Exactly how much damage has occurred.  To plan for what can be done to help. These include:  Tests of nerve function through muscles (nerve conduction studies and electromyography).  Tests of bladder function (urodynamics).  Tests that focus on defining specific nerve problems before surgery and what improvement has come about after surgery (evoked potentials). TREATMENT If the injury is limited to a break of a transverse process with no other bone or organ injury, hospital care may not be necessary. Medication for pain control, special back bracing, and limitations in activity are done first followed by physical therapy later. Complex breaks, multiple fractures of the spine, or unstable injuries can damage the spinal cord and may require an operation to remove pressure from the nerves and/or spinal cord and to stabilize the broken pieces of bone. Specialty care may be needed if there has been injury to an internal organ near a broken transverse process.Each individual set of injuries is unique, and your caregiver will review many things when planning the best approach that will give the highest likelihood of a good outcome.  HOME CARE INSTRUCTIONS There is pain and stiffness in the back for weeks after a transverse process fracture. Bed rest, pain medicine, and a slow return to activity are generally recommended. Neck and back braces may be helpful in reducing pain and increasing mobility. When your pain allows, simple walking will help to begin the process of returning to normal activities. Exercises to improve motion and to strengthen the back may also be useful after the initial pain goes away. This will be guided by your caregiver and the team (nurses, physical therapists, occupational therapists, etc.) involved with your ongoing care. For the elderly, treatment for osteoporosis may  be essential to help reduce your risk of fractures in the future. Arrange for follow-up care as recommended to assure proper long-term care and prevention of further spine injury. It is important that you participate in your return to good health. The failure to follow up as recommended with your caregiver, orthopedic referral or other provider may result in the bone not healing properly with possible chronic disability or pain. SEEK MEDICAL CARE IF:  Pain is not effectively controlled with medication.  You feel unable to decrease pain medication over time as planned.  Activity level is not improving as planned and/or expected. SEEK IMMEDIATE MEDICAL CARE IF:  You have increasing pain, vomiting, or are unable to move around at all.  You have numbness, tingling, weakness, or paralysis of any part of your body.  You have loss of normal bowel or bladder control.  You have difficulty breathing, cough, fever, chest or abdominal pain. MAKE SURE YOU:   Understand these instructions.  Will watch your condition.  Will get help right away if you are not doing well or get worse. Document Released: 01/01/2007 Document Revised: 12/09/2011 Document Reviewed: 09/01/2007 [facility] Patient Information 2014 [place], [place].  Urinary Tract Infection Urinary tract infections (UTIs) can develop anywhere along your urinary tract. Your urinary tract is your body's drainage system for removing wastes and extra water. Your urinary tract includes two kidneys, two ureters, a bladder, and a urethra. Your kidneys are a pair of bean-shaped organs. Each kidney is about the size of your fist. They are located below your ribs, one on each side of your spine. CAUSES Infections are caused by microbes, which are microscopic organisms, including fungi, viruses, and bacteria. These organisms are so small that they can only be seen through a microscope. Bacteria are the microbes that most commonly cause UTIs. SYMPTOMS    Symptoms of UTIs may vary by age and gender of the patient and by the location of the infection. Symptoms in young women typically include a frequent and intense urge to urinate and a painful, burning feeling in the bladder or urethra during urination. Older women and men are more likely to be tired, shaky, and weak and have muscle aches and abdominal pain. A fever may mean the infection is in your kidneys. Other symptoms of a kidney infection include pain in your back or sides below the ribs, nausea, and vomiting. DIAGNOSIS To diagnose a UTI, your caregiver will ask you about your symptoms. Your caregiver also will ask to provide a urine sample. The urine sample will be tested for bacteria and white blood cells. White blood cells are made by your body to help fight infection. TREATMENT  Typically, UTIs can be treated with medication. Because most UTIs are caused by a bacterial infection, they usually can be treated with the use of antibiotics. The choice of antibiotic and length of treatment depend on your symptoms and the type of bacteria causing your infection. HOME CARE INSTRUCTIONS  If you were prescribed antibiotics, take them exactly as your caregiver instructs you. Finish the medication even if you feel better after you have only taken some of the medication.  Drink enough water and fluids to keep your urine clear or pale yellow.  Avoid caffeine, tea, and carbonated beverages. They tend to irritate your bladder.  Empty your bladder often. Avoid holding urine for long periods of time.  Empty your bladder before and after sexual intercourse.  After a bowel movement, women should cleanse from front to back. Use each tissue only once. SEEK MEDICAL CARE IF:   You have back pain.  You develop a fever.  Your symptoms do not begin to resolve within 3 days. SEEK IMMEDIATE MEDICAL CARE IF:   You have severe back pain or lower abdominal pain.  You develop chills.  You have nausea or  vomiting.  You have continued burning or discomfort with urination. MAKE SURE YOU:   Understand these instructions.  Will watch your condition.  Will get help right  away if you are not doing well or get worse. Document Released: 06/26/2005 Document Revised: 03/17/2012 Document Reviewed: 10/25/2011 [facility] Patient Information 2014 [place].

## 2013-12-28 NOTE — ED Notes (Signed)
Pt was able to ambulate with two assistance and a walker. Writer notified ERP Ward

## 2013-12-29 ENCOUNTER — Observation Stay (HOSPITAL_COMMUNITY)
Admission: EM | Admit: 2013-12-29 | Discharge: 2014-01-05 | Disposition: A | Discharge status: Home / Self Care | Payer: Medicare Other | Attending: Internal Medicine | Admitting: Internal Medicine

## 2013-12-29 ENCOUNTER — Encounter (HOSPITAL_COMMUNITY): Payer: Self-pay | Admitting: Emergency Medicine

## 2013-12-29 ENCOUNTER — Emergency Department (HOSPITAL_COMMUNITY): Payer: Medicare Other

## 2013-12-29 DIAGNOSIS — W19XXXA Unspecified fall, initial encounter: Secondary | ICD-10-CM

## 2013-12-29 DIAGNOSIS — I452 Bifascicular block: Secondary | ICD-10-CM | POA: Insufficient documentation

## 2013-12-29 DIAGNOSIS — R269 Unspecified abnormalities of gait and mobility: Secondary | ICD-10-CM

## 2013-12-29 DIAGNOSIS — S32009A Unspecified fracture of unspecified lumbar vertebra, initial encounter for closed fracture: Secondary | ICD-10-CM

## 2013-12-29 DIAGNOSIS — Z794 Long term (current) use of insulin: Secondary | ICD-10-CM | POA: Insufficient documentation

## 2013-12-29 DIAGNOSIS — Z91041 Radiographic dye allergy status: Secondary | ICD-10-CM | POA: Insufficient documentation

## 2013-12-29 DIAGNOSIS — R1310 Dysphagia, unspecified: Secondary | ICD-10-CM | POA: Diagnosis not present

## 2013-12-29 DIAGNOSIS — C7952 Secondary malignant neoplasm of bone marrow: Secondary | ICD-10-CM

## 2013-12-29 DIAGNOSIS — I10 Essential (primary) hypertension: Secondary | ICD-10-CM

## 2013-12-29 DIAGNOSIS — D72829 Elevated white blood cell count, unspecified: Secondary | ICD-10-CM | POA: Diagnosis present

## 2013-12-29 DIAGNOSIS — Z8673 Personal history of transient ischemic attack (TIA), and cerebral infarction without residual deficits: Secondary | ICD-10-CM | POA: Diagnosis not present

## 2013-12-29 DIAGNOSIS — R079 Chest pain, unspecified: Secondary | ICD-10-CM | POA: Insufficient documentation

## 2013-12-29 DIAGNOSIS — K5900 Constipation, unspecified: Secondary | ICD-10-CM | POA: Insufficient documentation

## 2013-12-29 DIAGNOSIS — E119 Type 2 diabetes mellitus without complications: Secondary | ICD-10-CM | POA: Diagnosis present

## 2013-12-29 DIAGNOSIS — E440 Moderate protein-calorie malnutrition: Secondary | ICD-10-CM | POA: Diagnosis not present

## 2013-12-29 DIAGNOSIS — Z8551 Personal history of malignant neoplasm of bladder: Secondary | ICD-10-CM | POA: Insufficient documentation

## 2013-12-29 DIAGNOSIS — M545 Low back pain, unspecified: Secondary | ICD-10-CM | POA: Diagnosis not present

## 2013-12-29 DIAGNOSIS — R32 Unspecified urinary incontinence: Secondary | ICD-10-CM | POA: Insufficient documentation

## 2013-12-29 DIAGNOSIS — R748 Abnormal levels of other serum enzymes: Secondary | ICD-10-CM | POA: Diagnosis not present

## 2013-12-29 DIAGNOSIS — C61 Malignant neoplasm of prostate: Secondary | ICD-10-CM

## 2013-12-29 DIAGNOSIS — G9340 Encephalopathy, unspecified: Secondary | ICD-10-CM

## 2013-12-29 DIAGNOSIS — J90 Pleural effusion, not elsewhere classified: Secondary | ICD-10-CM | POA: Insufficient documentation

## 2013-12-29 DIAGNOSIS — M546 Pain in thoracic spine: Secondary | ICD-10-CM | POA: Insufficient documentation

## 2013-12-29 DIAGNOSIS — E869 Volume depletion, unspecified: Secondary | ICD-10-CM | POA: Insufficient documentation

## 2013-12-29 DIAGNOSIS — Z86718 Personal history of other venous thrombosis and embolism: Secondary | ICD-10-CM | POA: Insufficient documentation

## 2013-12-29 DIAGNOSIS — D696 Thrombocytopenia, unspecified: Secondary | ICD-10-CM | POA: Insufficient documentation

## 2013-12-29 DIAGNOSIS — I498 Other specified cardiac arrhythmias: Secondary | ICD-10-CM | POA: Insufficient documentation

## 2013-12-29 DIAGNOSIS — R52 Pain, unspecified: Secondary | ICD-10-CM | POA: Diagnosis present

## 2013-12-29 DIAGNOSIS — Z79899 Other long term (current) drug therapy: Secondary | ICD-10-CM | POA: Diagnosis not present

## 2013-12-29 DIAGNOSIS — R0602 Shortness of breath: Secondary | ICD-10-CM | POA: Insufficient documentation

## 2013-12-29 DIAGNOSIS — R7989 Other specified abnormal findings of blood chemistry: Secondary | ICD-10-CM | POA: Diagnosis not present

## 2013-12-29 DIAGNOSIS — M8448XA Pathological fracture, other site, initial encounter for fracture: Secondary | ICD-10-CM | POA: Diagnosis not present

## 2013-12-29 DIAGNOSIS — M810 Age-related osteoporosis without current pathological fracture: Secondary | ICD-10-CM | POA: Diagnosis not present

## 2013-12-29 DIAGNOSIS — S3210XA Unspecified fracture of sacrum, initial encounter for closed fracture: Secondary | ICD-10-CM

## 2013-12-29 DIAGNOSIS — J9600 Acute respiratory failure, unspecified whether with hypoxia or hypercapnia: Secondary | ICD-10-CM

## 2013-12-29 DIAGNOSIS — C7951 Secondary malignant neoplasm of bone: Secondary | ICD-10-CM | POA: Diagnosis not present

## 2013-12-29 DIAGNOSIS — R799 Abnormal finding of blood chemistry, unspecified: Secondary | ICD-10-CM

## 2013-12-29 DIAGNOSIS — D638 Anemia in other chronic diseases classified elsewhere: Secondary | ICD-10-CM

## 2013-12-29 DIAGNOSIS — I2510 Atherosclerotic heart disease of native coronary artery without angina pectoris: Secondary | ICD-10-CM

## 2013-12-29 DIAGNOSIS — E785 Hyperlipidemia, unspecified: Secondary | ICD-10-CM

## 2013-12-29 DIAGNOSIS — R778 Other specified abnormalities of plasma proteins: Secondary | ICD-10-CM

## 2013-12-29 DIAGNOSIS — Z9079 Acquired absence of other genital organ(s): Secondary | ICD-10-CM | POA: Diagnosis not present

## 2013-12-29 DIAGNOSIS — Z87891 Personal history of nicotine dependence: Secondary | ICD-10-CM | POA: Insufficient documentation

## 2013-12-29 DIAGNOSIS — E860 Dehydration: Secondary | ICD-10-CM | POA: Diagnosis not present

## 2013-12-29 DIAGNOSIS — S32010A Wedge compression fracture of first lumbar vertebra, initial encounter for closed fracture: Secondary | ICD-10-CM

## 2013-12-29 DIAGNOSIS — R2681 Unsteadiness on feet: Secondary | ICD-10-CM

## 2013-12-29 DIAGNOSIS — M533 Sacrococcygeal disorders, not elsewhere classified: Secondary | ICD-10-CM

## 2013-12-29 DIAGNOSIS — K219 Gastro-esophageal reflux disease without esophagitis: Secondary | ICD-10-CM | POA: Diagnosis not present

## 2013-12-29 DIAGNOSIS — E46 Unspecified protein-calorie malnutrition: Secondary | ICD-10-CM

## 2013-12-29 DIAGNOSIS — D630 Anemia in neoplastic disease: Secondary | ICD-10-CM | POA: Insufficient documentation

## 2013-12-29 DIAGNOSIS — C5700 Malignant neoplasm of unspecified fallopian tube: Secondary | ICD-10-CM

## 2013-12-29 LAB — CBC WITH DIFFERENTIAL/PLATELET
BASOS ABS: 0 10*3/uL (ref 0.0–0.1)
Basophils Relative: 0 % (ref 0–1)
Eosinophils Absolute: 0.4 10*3/uL (ref 0.0–0.7)
Eosinophils Relative: 4 % (ref 0–5)
HCT: 38.8 % — ABNORMAL LOW (ref 39.0–52.0)
Hemoglobin: 13.1 g/dL (ref 13.0–17.0)
LYMPHS PCT: 9 % — AB (ref 12–46)
Lymphs Abs: 0.8 10*3/uL (ref 0.7–4.0)
MCH: 32.3 pg (ref 26.0–34.0)
MCHC: 33.8 g/dL (ref 30.0–36.0)
MCV: 95.8 fL (ref 78.0–100.0)
Monocytes Absolute: 1 10*3/uL (ref 0.1–1.0)
Monocytes Relative: 11 % (ref 3–12)
NEUTROS ABS: 6.9 10*3/uL (ref 1.7–7.7)
NEUTROS PCT: 76 % (ref 43–77)
PLATELETS: 110 10*3/uL — AB (ref 150–400)
RBC: 4.05 MIL/uL — ABNORMAL LOW (ref 4.22–5.81)
RDW: 13.1 % (ref 11.5–15.5)
WBC: 9.2 10*3/uL (ref 4.0–10.5)

## 2013-12-29 LAB — URINE MICROSCOPIC-ADD ON

## 2013-12-29 LAB — BASIC METABOLIC PANEL
BUN: 24 mg/dL — ABNORMAL HIGH (ref 6–23)
CHLORIDE: 99 meq/L (ref 96–112)
CO2: 23 meq/L (ref 19–32)
Calcium: 9.5 mg/dL (ref 8.4–10.5)
Creatinine, Ser: 1.43 mg/dL — ABNORMAL HIGH (ref 0.50–1.35)
GFR calc Af Amer: 48 mL/min — ABNORMAL LOW (ref >90)
GFR calc non Af Amer: 41 mL/min — ABNORMAL LOW (ref >90)
Glucose, Bld: 223 mg/dL — ABNORMAL HIGH (ref 70–99)
Potassium: 3.9 mEq/L (ref 3.7–5.3)
Sodium: 137 mEq/L (ref 137–147)

## 2013-12-29 LAB — URINALYSIS, ROUTINE W REFLEX MICROSCOPIC
Bilirubin Urine: NEGATIVE
GLUCOSE, UA: NEGATIVE mg/dL
Ketones, ur: NEGATIVE mg/dL
NITRITE: NEGATIVE
PROTEIN: 100 mg/dL — AB
Specific Gravity, Urine: 1.021 (ref 1.005–1.030)
Urobilinogen, UA: 0.2 mg/dL (ref 0.0–1.0)
pH: 6 (ref 5.0–8.0)

## 2013-12-29 LAB — GLUCOSE, CAPILLARY
GLUCOSE-CAPILLARY: 208 mg/dL — AB (ref 70–99)
Glucose-Capillary: 187 mg/dL — ABNORMAL HIGH (ref 70–99)

## 2013-12-29 LAB — MRSA PCR SCREENING: MRSA by PCR: POSITIVE — AB

## 2013-12-29 LAB — PRO B NATRIURETIC PEPTIDE: Pro B Natriuretic peptide (BNP): 2026 pg/mL — ABNORMAL HIGH (ref 0–450)

## 2013-12-29 LAB — I-STAT TROPONIN, ED: Troponin i, poc: 0.39 ng/mL (ref 0.00–0.08)

## 2013-12-29 MED ORDER — METOPROLOL TARTRATE 50 MG PO TABS
50.0000 mg | ORAL_TABLET | Freq: Every day | ORAL | Status: DC
  Administered 2013-12-29: 50 mg via ORAL
  Filled 2013-12-29 (×2): qty 1

## 2013-12-29 MED ORDER — ASPIRIN 81 MG PO CHEW
81.0000 mg | CHEWABLE_TABLET | Freq: Every day | ORAL | Status: DC
  Administered 2013-12-29 – 2013-12-31 (×3): 81 mg via ORAL
  Filled 2013-12-29 (×3): qty 1

## 2013-12-29 MED ORDER — CARBOXYMETHYLCELLULOSE SODIUM 0.5 % OP SOLN
1.0000 [drp] | Freq: Two times a day (BID) | OPHTHALMIC | Status: DC | PRN
Start: 2013-12-29 — End: 2013-12-29

## 2013-12-29 MED ORDER — ATORVASTATIN CALCIUM 20 MG PO TABS
20.0000 mg | ORAL_TABLET | Freq: Every day | ORAL | Status: DC
  Administered 2013-12-29 – 2014-01-04 (×7): 20 mg via ORAL
  Filled 2013-12-29 (×8): qty 1

## 2013-12-29 MED ORDER — OXYBUTYNIN CHLORIDE 5 MG PO TABS
5.0000 mg | ORAL_TABLET | Freq: Every day | ORAL | Status: DC
  Administered 2013-12-29 – 2014-01-04 (×7): 5 mg via ORAL
  Filled 2013-12-29 (×9): qty 1

## 2013-12-29 MED ORDER — POLYVINYL ALCOHOL 1.4 % OP SOLN
1.0000 [drp] | Freq: Two times a day (BID) | OPHTHALMIC | Status: DC | PRN
  Filled 2013-12-29: qty 15

## 2013-12-29 MED ORDER — INSULIN ASPART 100 UNIT/ML ~~LOC~~ SOLN
0.0000 [IU] | Freq: Three times a day (TID) | SUBCUTANEOUS | Status: DC
  Administered 2013-12-29: 3 [IU] via SUBCUTANEOUS
  Administered 2013-12-30 (×2): 5 [IU] via SUBCUTANEOUS
  Administered 2013-12-30 – 2013-12-31 (×2): 2 [IU] via SUBCUTANEOUS
  Administered 2013-12-31 (×2): 3 [IU] via SUBCUTANEOUS
  Administered 2014-01-01 (×3): 2 [IU] via SUBCUTANEOUS
  Administered 2014-01-02: 3 [IU] via SUBCUTANEOUS
  Administered 2014-01-02: 2 [IU] via SUBCUTANEOUS
  Administered 2014-01-03: 3 [IU] via SUBCUTANEOUS
  Administered 2014-01-03 – 2014-01-05 (×4): 2 [IU] via SUBCUTANEOUS

## 2013-12-29 MED ORDER — MELATONIN 3 MG PO TABS: 3.0000 mg | ORAL_TABLET | Freq: Every day | ORAL | Status: DC

## 2013-12-29 MED ORDER — PANCRELIPASE (LIP-PROT-AMYL) 12000-38000 UNITS PO CPEP
4.0000 | ORAL_CAPSULE | Freq: Three times a day (TID) | ORAL | Status: DC
  Administered 2013-12-29 – 2014-01-05 (×18): 4 via ORAL
  Filled 2013-12-29 (×24): qty 4

## 2013-12-29 MED ORDER — GLIMEPIRIDE 4 MG PO TABS
4.0000 mg | ORAL_TABLET | Freq: Every day | ORAL | Status: DC
  Administered 2013-12-30: 4 mg via ORAL
  Filled 2013-12-29 (×2): qty 1

## 2013-12-29 MED ORDER — ASPIRIN 81 MG PO CHEW
324.0000 mg | CHEWABLE_TABLET | Freq: Once | ORAL | Status: AC
  Administered 2013-12-29: 324 mg via ORAL
  Filled 2013-12-29: qty 4

## 2013-12-29 MED ORDER — HYDROCODONE-ACETAMINOPHEN 5-325 MG PO TABS
1.0000 | ORAL_TABLET | ORAL | Status: DC | PRN
  Administered 2013-12-30 – 2014-01-03 (×8): 2 via ORAL
  Administered 2014-01-03 – 2014-01-05 (×3): 1 via ORAL
  Filled 2013-12-29: qty 1
  Filled 2013-12-29 (×2): qty 2
  Filled 2013-12-29: qty 1
  Filled 2013-12-29 (×6): qty 2
  Filled 2013-12-29: qty 1

## 2013-12-29 MED ORDER — MORPHINE SULFATE 2 MG/ML IJ SOLN
2.0000 mg | INTRAMUSCULAR | Status: DC | PRN
  Administered 2013-12-30: 2 mg via INTRAVENOUS
  Filled 2013-12-29: qty 1

## 2013-12-29 MED ORDER — INSULIN ASPART 100 UNIT/ML ~~LOC~~ SOLN
0.0000 [IU] | Freq: Every day | SUBCUTANEOUS | Status: DC
  Administered 2013-12-29 – 2014-01-02 (×3): 2 [IU] via SUBCUTANEOUS

## 2013-12-29 MED ORDER — ACETAMINOPHEN 325 MG PO TABS: 650.0000 mg | ORAL_TABLET | Freq: Four times a day (QID) | ORAL | Status: DC | PRN

## 2013-12-29 MED ORDER — MORPHINE SULFATE 2 MG/ML IJ SOLN
2.0000 mg | INTRAMUSCULAR | Status: DC | PRN
  Administered 2013-12-29 (×2): 2 mg via INTRAVENOUS
  Filled 2013-12-29 (×3): qty 1

## 2013-12-29 MED ORDER — SODIUM CHLORIDE 0.9 % IV SOLN
INTRAVENOUS | Status: DC
  Administered 2013-12-29: 16:00:00 via INTRAVENOUS

## 2013-12-29 MED ORDER — BICALUTAMIDE 50 MG PO TABS
50.0000 mg | ORAL_TABLET | Freq: Every day | ORAL | Status: DC
  Administered 2013-12-29 – 2014-01-05 (×7): 50 mg via ORAL
  Filled 2013-12-29 (×8): qty 1

## 2013-12-29 MED ORDER — CALCIUM CARBONATE-VITAMIN D 500-200 MG-UNIT PO TABS
1.0000 | ORAL_TABLET | Freq: Two times a day (BID) | ORAL | Status: DC
  Administered 2013-12-30 – 2014-01-05 (×11): 1 via ORAL
  Filled 2013-12-29 (×15): qty 1

## 2013-12-29 MED ORDER — SODIUM CHLORIDE 0.9 % IV SOLN: INTRAVENOUS | Status: AC

## 2013-12-29 MED ORDER — ADULT MULTIVITAMIN W/MINERALS CH
1.0000 | ORAL_TABLET | Freq: Every morning | ORAL | Status: DC
  Administered 2013-12-29 – 2014-01-05 (×7): 1 via ORAL
  Filled 2013-12-29 (×8): qty 1

## 2013-12-29 MED ORDER — INSULIN ASPART 100 UNIT/ML ~~LOC~~ SOLN: 0.0000 [IU] | Freq: Three times a day (TID) | SUBCUTANEOUS | Status: DC

## 2013-12-29 MED ORDER — ONDANSETRON HCL 4 MG PO TABS: 4.0000 mg | ORAL_TABLET | Freq: Four times a day (QID) | ORAL | Status: DC | PRN

## 2013-12-29 MED ORDER — AMLODIPINE BESYLATE 10 MG PO TABS
10.0000 mg | ORAL_TABLET | Freq: Every day | ORAL | Status: DC
  Administered 2013-12-29 – 2014-01-05 (×7): 10 mg via ORAL
  Filled 2013-12-29 (×8): qty 1

## 2013-12-29 MED ORDER — FLUTICASONE PROPIONATE 50 MCG/ACT NA SUSP
1.0000 | Freq: Two times a day (BID) | NASAL | Status: DC
  Administered 2013-12-29 – 2014-01-05 (×13): 1 via NASAL
  Filled 2013-12-29: qty 16

## 2013-12-29 MED ORDER — DOCUSATE SODIUM 100 MG PO CAPS
100.0000 mg | ORAL_CAPSULE | Freq: Two times a day (BID) | ORAL | Status: DC
  Administered 2013-12-29 – 2014-01-05 (×13): 100 mg via ORAL
  Filled 2013-12-29 (×15): qty 1

## 2013-12-29 MED ORDER — SIMVASTATIN 40 MG PO TABS: 40.0000 mg | ORAL_TABLET | Freq: Every day | ORAL | Status: DC

## 2013-12-29 MED ORDER — ACETAMINOPHEN 650 MG RE SUPP: 650.0000 mg | Freq: Four times a day (QID) | RECTAL | Status: DC | PRN

## 2013-12-29 MED ORDER — ONDANSETRON HCL 4 MG/2ML IJ SOLN: 4.0000 mg | Freq: Four times a day (QID) | INTRAMUSCULAR | Status: DC | PRN

## 2013-12-29 MED ORDER — NITROGLYCERIN 0.4 MG SL SUBL: 0.4000 mg | SUBLINGUAL_TABLET | SUBLINGUAL | Status: DC | PRN

## 2013-12-29 MED ORDER — INSULIN GLARGINE 100 UNIT/ML ~~LOC~~ SOLN
16.0000 [IU] | Freq: Every day | SUBCUTANEOUS | Status: DC
  Administered 2013-12-29 – 2014-01-04 (×7): 16 [IU] via SUBCUTANEOUS
  Filled 2013-12-29 (×8): qty 0.16

## 2013-12-29 MED ORDER — HYDROXYZINE HCL 25 MG PO TABS
25.0000 mg | ORAL_TABLET | Freq: Two times a day (BID) | ORAL | Status: DC
  Administered 2013-12-29 – 2014-01-05 (×13): 25 mg via ORAL
  Filled 2013-12-29 (×17): qty 1

## 2013-12-29 MED ORDER — CHLORHEXIDINE GLUCONATE CLOTH 2 % EX PADS
6.0000 | MEDICATED_PAD | Freq: Every day | CUTANEOUS | Status: AC
  Administered 2013-12-30 – 2014-01-03 (×5): 6 via TOPICAL

## 2013-12-29 MED ORDER — MUPIROCIN 2 % EX OINT
1.0000 "application " | TOPICAL_OINTMENT | Freq: Two times a day (BID) | CUTANEOUS | Status: AC
  Administered 2013-12-30 – 2014-01-03 (×10): 1 via NASAL
  Filled 2013-12-29: qty 22

## 2013-12-29 NOTE — ED Provider Notes (Signed)
CSN: [id number]     Arrival date & time 12/29/13  1023 History  First MD Initiated Contact with Patient 12/29/13 1033     Chief Complaint  Patient presents with  . Back Pain    HPI Patient presents to the emergency room with complaints of persistent back pain.  The patient uses a walker. At baseline he has some difficulty with his balance and ambulation. Patient fell on "Sunday. Was seen in the emergency department and had x-rays demonstrating a probable transverse process fracture of his right L3 vertebra.  The patient was able to walk the ninth of the injury and was discharged home. Yesterday he had difficulty getting out of bed. The home health nurse assessed him and told the patient and his wife that he could not get out of bed today he should go back to the emergency room.  Patient was not able to get out of bed today because of his severe pain. The pain is sharp and throughout his back. It increases whenever he tries to move. He denies any weakness or numbness in his lower extremities. He's not having any issue with urination although he does chronically have an indwelling Foley catheter.    Patient also has had intermittent episodes of shortness of breath. This is not a new problem for him. He states he's had this off-and-on for years and no one has ever been able to tell him what causes his breathing issues. Patient states suddenly he will have episodes where he feels like he is short of breath. He had an episode like that this morning. He does not think it occurred specifically when he had the back pain. He has not had any chest pain. Has not had any coughing. Right now he does not feel short of breath. Past Medical History  Diagnosis Date  . Hypertension   . Diabetes mellitus without complication   . Arthritis   . Sciatica   . GERD (gastroesophageal reflux disease)   . Urge incontinence   . Vertigo   . Hyperlipidemia   . ED (erectile dysfunction)   . Gastric polyposis   . Osteoporosis    . Decreased libido   . Depression   . Hepatitis A   . Stroke   . History of blood transfusion     19" 48  . OA (osteoarthritis)   . Bladder cancer   . Prostate cancer     S/P prostatectomy; Lung metastasis  . CAD (coronary artery disease) 1996    stent to the LAD   . Hyperlipidemia   . Type 2 diabetes mellitus    Past Surgical History  Procedure Laterality Date  . Carpal tunnel release    . Cataract extraction w/ intraocular lens  implant, bilateral  1998  . Prostatectomy    . Urinary sphincter implant    . Urinary sphincter implant revision    . Appendectomy    . Left hip surgery      Donated bone for bone graft to arm  . Penile prosthesis implant      S/P removal and re-implantation of new prosthesis  . Middle ear surgery    . Finger surgery      Left and right  . Hernia repair    . Bladder surgery    . Right arm bone graft      Pathological fracture  . Cardiac catheterization  11/27/2001    patent stent with mild in-stent restenosis.  . Myoview  06/12/2009    Persantine  myoview EF 76%; Normal myoview  . Coronary angioplasty  1996    stenting to the LAD in [place].    Family History  Problem Relation Age of Onset  . Heart failure Mother   . Bladder Cancer Father   . Pancreatic cancer Sister   . Lung cancer Sister   . Breast cancer Daughter   . Liver disease Son    History  Substance Use Topics  . Smoking status: Former Research scientist (life sciences)  . Smokeless tobacco: Never Used  . Alcohol Use: No    Review of Systems  Constitutional: Negative for fever.  Gastrointestinal: Negative for abdominal pain and diarrhea.  Musculoskeletal: Negative for joint swelling.  All other systems reviewed and are negative.      Allergies  Albuterol sulfate hfa; Adhesive; Albuterol; Amlodipine; Contrast media; Lactose; Metrizamide; Norvasc; Penicillins; Sulfa drugs cross reactors; and Sulfamethoxazole  Home Medications   Current Outpatient Rx  Name  Route  Sig  Dispense  Refill   . amLODipine (NORVASC) 10 MG tablet   Oral   Take 10 mg by mouth daily.         . bicalutamide (CASODEX) 50 MG tablet   Oral   Take 50 mg by mouth daily.         . Calcium Carbonate-Vitamin D (CALCIUM 600+D HIGH POTENCY) 600-400 MG-UNIT per tablet   Oral   Take 1 tablet by mouth 2 (two) times daily.           . carboxymethylcellulose (REFRESH PLUS) 0.5 % SOLN   Both Eyes   Place 1 drop into both eyes 2 (two) times daily as needed (for dry eyes).         . ciprofloxacin (CIPRO) 500 MG tablet   Oral   Take 1 tablet (500 mg total) by mouth 2 (two) times daily.   14 tablet   0   . docusate sodium (COLACE) 100 MG capsule   Oral   Take 200 mg by mouth daily.          [name] docusate sodium (COLACE) 100 MG capsule   Oral   Take 1 capsule (100 mg total) by mouth every 12 (twelve) hours.   60 capsule   0   . fluticasone (FLONASE) 50 MCG/ACT nasal spray   Nasal   Place 2 sprays into the nose 2 (two) times daily.         [name] glimepiride (AMARYL) 4 MG tablet   Oral   Take 4 mg by mouth daily before breakfast.         . HYDROcodone-acetaminophen (NORCO/VICODIN) 5-325 MG per tablet   Oral   Take 1 tablet by mouth every 6 (six) hours as needed.   15 tablet   0   . hydrOXYzine (ATARAX/VISTARIL) 25 MG tablet   Oral   Take 25 mg by mouth 2 (two) times daily.          . insulin aspart (NOVOLOG) 100 UNIT/ML injection   Subcutaneous   Inject 0-6 Units into the skin 3 (three) times daily before meals. 0-150=0; 151-200=2u; 201-250=3u; 251-300=4u; 301-350=5u; over 350 =6u prior to meals         . insulin glargine (LANTUS) 100 UNIT/ML injection   Subcutaneous   Inject 16 Units into the skin at bedtime.          . lipase/protease/amylase (CREON-12/PANCREASE) 12000 UNITS CPEP   Oral   Take 4 capsules by mouth 3 (three) times daily.         [name]  Melatonin 3 MG TABS   Oral   Take 3 mg by mouth at bedtime.         . metoprolol (LOPRESSOR) 50 MG tablet   Oral    Take 50 mg by mouth daily.         . Multiple Vitamins-Minerals (MULTIVITAMINS THER. W/MINERALS) TABS   Oral   Take 1 tablet by mouth every morning.          . nitroGLYCERIN (NITROSTAT) 0.4 MG SL tablet   Sublingual   Place 0.4 mg under the tongue every 5 (five) minutes as needed for chest pain.          [name] oxybutynin (DITROPAN) 5 MG tablet   Oral   Take 5 mg by mouth at bedtime.         . simvastatin (ZOCOR) 40 MG tablet   Oral   Take 40 mg by mouth at bedtime.          BP 157/77  Pulse 78  Temp(Src) 98.1 F (36.7 C) (Oral)  Resp 16  SpO2 97% Physical Exam  Nursing note and vitals reviewed. Constitutional: No distress.  Frail, elderly  HENT:  Head: Normocephalic and atraumatic.  Right Ear: External ear normal.  Left Ear: External ear normal.  Eyes: Conjunctivae are normal. Right eye exhibits no discharge. Left eye exhibits no discharge. No scleral icterus.  Neck: Neck supple. No tracheal deviation present.  Cardiovascular: Normal rate, regular rhythm and intact distal pulses.   Pulmonary/Chest: Effort normal and breath sounds normal. No stridor. No respiratory distress. He has no wheezes. He has no rales.  Abdominal: Soft. Bowel sounds are normal. He exhibits no distension. There is no tenderness. There is no rebound and no guarding.  Genitourinary:  Foley catheter in place, yellow urine in the catheter bag  Musculoskeletal: He exhibits no edema.       Cervical back: Normal. He exhibits normal range of motion, no tenderness and no bony tenderness.       Thoracic back: He exhibits tenderness and bony tenderness. He exhibits no swelling, no edema and no deformity.       Lumbar back: He exhibits decreased range of motion (pain with trying to sit up in bed), tenderness and bony tenderness. He exhibits no swelling, no edema and no deformity.  Kyphotic, warm well-perfused extremities  Neurological: He is alert. He has normal strength. No cranial nerve deficit (no  facial droop, extraocular movements intact, no slurred speech) or sensory deficit. He exhibits normal muscle tone. He displays no seizure activity. Coordination normal.  Equal plantar flexion strength, normal sensation of light touch in his lower extremities  Skin: Skin is warm and dry. No rash noted. He is not diaphoretic.  Psychiatric: He has a normal mood and affect.    ED Course  Procedures (including critical care time) Labs Review Labs Reviewed  CBC WITH DIFFERENTIAL - Abnormal; Notable for the following:    RBC 4.05 (*)    HCT 38.8 (*)    Platelets 110 (*)    Lymphocytes Relative 9 (*)    All other components within normal limits  BASIC METABOLIC PANEL - Abnormal; Notable for the following:    Glucose, Bld 223 (*)    BUN 24 (*)    Creatinine, Ser 1.43 (*)    GFR calc non Af Amer 41 (*)    GFR calc Af Amer 48 (*)    All other components within normal limits  URINALYSIS, ROUTINE W REFLEX MICROSCOPIC -  Abnormal; Notable for the following:    APPearance TURBID (*)    Hgb urine dipstick MODERATE (*)    Protein, ur 100 (*)    Leukocytes, UA SMALL (*)    All other components within normal limits  PRO B NATRIURETIC PEPTIDE - Abnormal; Notable for the following:    Pro B Natriuretic peptide (BNP) 2026.0 (*)    All other components within normal limits  URINE MICROSCOPIC-ADD ON - Abnormal; Notable for the following:    Bacteria, UA MANY (*)    Casts HYALINE CASTS (*)    All other components within normal limits  I-STAT TROPOININ, ED - Abnormal; Notable for the following:    Troponin i, poc 0.39 (*)    All other components within normal limits   Imaging Review Dg Chest 2 View  12/27/2013   CLINICAL DATA:  Fall, right-sided chest pain  EXAM: CHEST  2 VIEW  COMPARISON:  Prior chest x-ray and right rib series 1 09/2013  FINDINGS: Low inspiratory volumes. Nodular opacities in the left mid lung are again noted. Stable cardiac and mediastinal contours. Background bronchitic changes  and interstitial prominence grossly similar compared to prior. No pneumothorax or large effusion. Pulmonary vascular congestion without overt edema. No definite acute fracture. Remote healed right proximal humeral fracture. Osteoarthritis of the bilateral acromioclavicular joints. No acute osseous abnormality. The bones are diffusely osteopenic.  IMPRESSION: Low inspiratory volumes with bibasilar atelectasis and mild vascular congestion.  Otherwise, stable chest x-ray.   Electronically Signed   By: Jacqulynn Cadet M.D.   On: 12/27/2013 23:20   Dg Hip Complete Right  12/27/2013   CLINICAL DATA:  Fall, right hip pain  EXAM: RIGHT HIP - COMPLETE 2+ VIEW  COMPARISON:  Prior radiographs of the pelvis and right hip 06/24/2013  FINDINGS: The bones are diffusely osteopenic. This limits evaluation for nondisplaced fractures considerably. No definite fracture identified. Prior lower lumbar compression fractures status post vertebroplasty. Numerous surgical clips project over the anatomic pelvis. Scattered atherosclerotic vascular calcifications. Calcifications of the bilateral vas deferens noted incidentally. Osseous excrescence arising from the lateral aspect of the left ilium a similar compared to prior.  IMPRESSION: No acute fracture identified although marked background osteopenia limits evaluation for nondisplaced fractures.   Electronically Signed   By: Jacqulynn Cadet M.D.   On: 12/27/2013 23:17   Ct Head Wo Contrast  12/27/2013   CLINICAL DATA:  Fall, headache and neck pain, back pain.  EXAM: CT HEAD WITHOUT CONTRAST  CT CERVICAL SPINE WITHOUT CONTRAST  TECHNIQUE: Multidetector CT imaging of the head and cervical spine was performed following the standard protocol without intravenous contrast. Multiplanar CT image reconstructions of the cervical spine were also generated.  COMPARISON:  CT HEAD W/O CM dated 03/21/2013; DG CERVICAL SPINE 2-3 VIEWS dated 10/07/2013  FINDINGS: CT HEAD FINDINGS  The ventricles and  sulci are normal for age. No intraparenchymal hemorrhage, mass effect nor midline shift. Confluent supratentorial white matter hypodensities are within normal range for patient's age and though non-specific suggest sequelae of chronic small vessel ischemic disease. No acute large vascular territory infarcts. Multiple tiny T2 hyperintensities within the basal ganglia and thalamus likely reflect remote lacunar infarcts.  No abnormal extra-axial fluid collections. Basal cisterns are patent. Moderate calcific atherosclerosis of the carotid siphons and included vertebral arteries.  No skull fracture. Status post bilateral ocular lens implants. Right maxillary mucosal thickening and bony wall changes consistent chronic sinusitis. Mild ethmoid mucosal thickening. Mastoid air cells are well aerated. Severe bilateral  temporomandibular osteoarthrosis. Status post bilateral ocular lens implants.  CT CERVICAL SPINE FINDINGS  Cervical vertebral bodies and posterior elements appear intact with accentuated cervical lordosis. The C3-4 facets are fused on degenerative basis. C1-2 articulation maintained with severe arthropathy. Calcified pannus about the odontoid process likely reflects CPPD. Severe C3-4 thru C5-6 degenerative disc disease. Bone mineral density is decreased without destructive bony lesions. Severe calcific atherosclerosis of the left carotid bulb, if clinical concern for hemodynamically significant stenosis, carotid ultrasound or CT angiogram would be more sensitive. Included view of the lung apices demonstrate calcified granulomas.  Degenerative disc disease and facet arthropathy result in mild canal stenosis C4-5 and C5-6. Moderate C4-5 and C5-6 neural foraminal narrowing.  IMPRESSION: CT head:  No acute intracranial process.  Involutional changes. Moderate to severe white matter changes suggest chronic small vessel ischemic disease with bilateral suspected basal ganglia and thalamus remote lacunar infarcts.  CT  cervical spine: Accentuated cervical lordosis without acute fracture nor malalignment.   Electronically Signed   By: Elon Alas   On: 12/27/2013 23:33   Ct Cervical Spine Wo Contrast  12/27/2013   CLINICAL DATA:  Fall, headache and neck pain, back pain.  EXAM: CT HEAD WITHOUT CONTRAST  CT CERVICAL SPINE WITHOUT CONTRAST  TECHNIQUE: Multidetector CT imaging of the head and cervical spine was performed following the standard protocol without intravenous contrast. Multiplanar CT image reconstructions of the cervical spine were also generated.  COMPARISON:  CT HEAD W/O CM dated 03/21/2013; DG CERVICAL SPINE 2-3 VIEWS dated 10/07/2013  FINDINGS: CT HEAD FINDINGS  The ventricles and sulci are normal for age. No intraparenchymal hemorrhage, mass effect nor midline shift. Confluent supratentorial white matter hypodensities are within normal range for patient's age and though non-specific suggest sequelae of chronic small vessel ischemic disease. No acute large vascular territory infarcts. Multiple tiny T2 hyperintensities within the basal ganglia and thalamus likely reflect remote lacunar infarcts.  No abnormal extra-axial fluid collections. Basal cisterns are patent. Moderate calcific atherosclerosis of the carotid siphons and included vertebral arteries.  No skull fracture. Status post bilateral ocular lens implants. Right maxillary mucosal thickening and bony wall changes consistent chronic sinusitis. Mild ethmoid mucosal thickening. Mastoid air cells are well aerated. Severe bilateral temporomandibular osteoarthrosis. Status post bilateral ocular lens implants.  CT CERVICAL SPINE FINDINGS  Cervical vertebral bodies and posterior elements appear intact with accentuated cervical lordosis. The C3-4 facets are fused on degenerative basis. C1-2 articulation maintained with severe arthropathy. Calcified pannus about the odontoid process likely reflects CPPD. Severe C3-4 thru C5-6 degenerative disc disease. Bone mineral  density is decreased without destructive bony lesions. Severe calcific atherosclerosis of the left carotid bulb, if clinical concern for hemodynamically significant stenosis, carotid ultrasound or CT angiogram would be more sensitive. Included view of the lung apices demonstrate calcified granulomas.  Degenerative disc disease and facet arthropathy result in mild canal stenosis C4-5 and C5-6. Moderate C4-5 and C5-6 neural foraminal narrowing.  IMPRESSION: CT head:  No acute intracranial process.  Involutional changes. Moderate to severe white matter changes suggest chronic small vessel ischemic disease with bilateral suspected basal ganglia and thalamus remote lacunar infarcts.  CT cervical spine: Accentuated cervical lordosis without acute fracture nor malalignment.   Electronically Signed   By: Elon Alas   On: 12/27/2013 23:33   Ct Thoracic Spine Wo Contrast  12/27/2013   CLINICAL DATA:  Fall, back pain.  Status post L1 kyphoplasty.  EXAM: CT THORACIC AND LUMBAR SPINE WITHOUT CONTRAST  TECHNIQUE: Multidetector CT imaging  of the thoracic and lumbar spine was performed without contrast. Multiplanar CT image reconstructions were also generated.  COMPARISON:  DG CHEST 2 VIEW dated 12/27/2013; IR KYPHO VERTEBRAL LUMBAR AUGMENTATION dated 10/12/2013; CT L SPINE W/O CM dated 10/07/2013  FINDINGS: CT THORACIC SPINE FINDINGS  Severe at T2 and T3 burst fractures, which were present on prior chest radiographs, with T2 vertebra plana ; at least 3 mm of retropulsed bony fragments at T2 and T3 without canal stenosis. At least 75% T3 vertebral body height loss. Very mild T4 compression fracture, mild T8 compression fracture with less than 25% height loss. Very mild T12 suspected compression fracture. No malalignment. Accentuated upper thoracic kyphosis.  Severe osteopenia. No destructive bony lesions. Intervertebral disc heights generally preserved with upper thoracic ventral endplate spurring. Apical calcified  granulomas. Included prevertebral and paraspinal soft tissues are nonsuspicious.  CT LUMBAR SPINE FINDINGS  Transitional anatomy with sacralized L5 vertebral body. Severe osteopenia. Moderate to severe L1 burst fracture status post vertebral body cement augmentation. Healing right L1 transverse process fracture. Moderate L3 vertebral body fracture and cement augmentation. Right L3 nondisplaced transverse process fracture appears new from prior imaging. Grade 1 L4-5 anterolisthesis on degenerative basis. Lumbar lordosis maintained. Severe osteopenia without destructive bony lesions.  Moderate calcific atherosclerosis of the aorta. Moderate symmetric paraspinal muscle atrophy. Retroperitoneal surgical clips. Status postcholecystectomy. Right upper pole 3.4 cm renal cyst.  Degenerative disc disease and facet arthropathy result in mild canal stenosis L2-3, moderate at L3-4 and moderate to severe L4-5. Moderate to severe right L3-4, moderate right L4-5 neural foraminal narrowing. Severe left L1-2, L3-4 and moderate left L4-5 neural foraminal narrowing.  IMPRESSION: CT THORACIC SPINE IMPRESSION  Severe osteopenia. Multiple thoracic compression fractures including severe T2 and T3 burst fractures which appear chronic, without canal stenosis, or malalignment. Accentuated upper thoracic kyphosis.  CT LUMBAR SPINE IMPRESSION  Severe osteopenia. Nondisplaced right L3 transverse process fracture may be acute.  Remote L1 and L3 compression fractures, status post vertebroplasty/ cement augmentation at these levels. Grade 1 L4-5 anterolisthesis on degenerative basis.   Electronically Signed   By: Elon Alas   On: 12/27/2013 23:53   Ct Lumbar Spine Wo Contrast  12/27/2013   CLINICAL DATA:  Fall, back pain.  Status post L1 kyphoplasty.  EXAM: CT THORACIC AND LUMBAR SPINE WITHOUT CONTRAST  TECHNIQUE: Multidetector CT imaging of the thoracic and lumbar spine was performed without contrast. Multiplanar CT image  reconstructions were also generated.  COMPARISON:  No comparisons  FINDINGS: CT THORACIC SPINE FINDINGS  Severe at T2 and T3 burst fractures, which were present on prior chest radiographs, with T2 vertebra plana ; at least 3 mm of retropulsed bony fragments at T2 and T3 without canal stenosis. At least 75% T3 vertebral body height loss. Very mild T4 compression fracture, mild T8 compression fracture with less than 25% height loss. Very mild T12 suspected compression fracture. No malalignment. Accentuated upper thoracic kyphosis.  Severe osteopenia. No destructive bony lesions. Intervertebral disc heights generally preserved with upper thoracic ventral endplate spurring. Apical calcified granulomas. Included prevertebral and paraspinal soft tissues are nonsuspicious.  CT LUMBAR SPINE FINDINGS  Transitional anatomy with sacralized L5 vertebral body. Severe osteopenia. Moderate to severe L1 burst fracture status post vertebral body cement augmentation. Healing right L1 transverse process fracture. Moderate L3 vertebral body fracture and cement augmentation. Right L3 nondisplaced transverse process fracture appears new from prior imaging. Grade 1 L4-5 anterolisthesis on degenerative basis. Lumbar lordosis maintained. Severe osteopenia without destructive bony lesions.  Moderate calcific atherosclerosis of the aorta. Moderate symmetric paraspinal muscle atrophy. Retroperitoneal surgical clips. Status postcholecystectomy. Right upper pole 3.4 cm renal cyst.  Degenerative disc disease and facet arthropathy result in mild canal stenosis L2-3, moderate at L3-4 and moderate to severe L4-5. Moderate to severe right L3-4, moderate right L4-5 neural foraminal narrowing. Severe left L1-2, L3-4 and moderate left L4-5 neural foraminal narrowing.  IMPRESSION: CT THORACIC SPINE IMPRESSION  Severe osteopenia. Multiple thoracic compression fractures including severe T2 and T3 burst fractures which appear chronic, without canal  stenosis, or malalignment. Accentuated upper thoracic kyphosis.  CT LUMBAR SPINE IMPRESSION  Severe osteopenia. Nondisplaced right L3 transverse process fracture may be acute.  Remote L1 and L3 compression fractures, status post vertebroplasty/ cement augmentation at these levels. Grade 1 L4-5 anterolisthesis on degenerative basis.   Electronically Signed   By: Elon Alas   On: 12/27/2013 23:51     EKG Interpretation   Date/Time:  Wednesday December 29 2013 11:39:21 EDT Ventricular Rate:  76 PR Interval:  173 QRS Duration: 139 QT Interval:  503 QTC Calculation: 566 R Axis:   -77 Text Interpretation:  Sinus rhythm RBBB and LAFB Probable anteroseptal  infarct, old No significant change since last tracing Confirmed by Josiah Nieto   MD-J, Teriyah Purington ([id number]) on 12/29/2013 11:47:04 AM      MDM   Final diagnoses:  None    Pt has persistent back pain from a recent fall.  The CT scan suggested a transverse process fracture as well as several chronic compression/burst fractures.  Symptoms have worsened and he cannot walk.  Will need rehab/pain management, possible nh placement.  Pt mentions intermittent breathing issues.  Does have history of CAD.  Sees Dr.  Gwenlyn Found.  Possible his shortness of breath may be related to intermittent angina.  Will need cardiology consultation.  Will consult medical and cardiology service.  Pt is not having chest pain now.  Troponin is elevated however.  Not clearly nstemi, no stemi on EKG.  Will continue to monitor.    Kathalene Frames, MD 12/30/13 [id number]

## 2013-12-29 NOTE — Progress Notes (Signed)
Patient ID: Guy [name], male   DOB: [birth year], 80 y.o.   MRN: [id number]    12/30/2013 Guy [name]   Sep 27, 1920  [id number]  Primary Physicia Guy [name], [name] Primary Cardiologist: Guy [name]  HPI:  Guy [name] presents to clinic today for surgical clearance. He wants to undergo a dental procedure that will require full anesthesia. The procedure is for dental implants to allow for placement of dentures. He is not a candidate for traditional dentures. Without this procedure, he will continue to be on a very limited diet. His cardiac history is outlined below.   Guy [name] is a 80 y/o male followed by Guy [name].  He has a history of CAD, status post remote LAD stenting in 1996 in [place]. Guy [name] catheterized him November 27, 2001 revealing a patent LAD stent with mild in-stent restenosis. His last Myoview performed in March of 2008 was nonischemic, as was one on June 12, 2009. His other problems include hypertension, hyperlipidemia, and non-insulin-requiring diabetes. He does have metastatic prostate cancer involving his lungs and ribs and has had multiple operations for these. He gets occasion chest pain which is nitrate responsive. His last episode of chest pain was last week. It was left sided pain that woke him from his sleep. It was relieved fairly quickly with SL NTG. He had no other symptoms, denying SOB, diaphoresis, n/v. He denies any further recurrence since then. He also states that he recently fell several weeks ago and broke several ribs on the left side. He is unsure if the pain was related to this, but feels that it was probably cardiac pain, as it was relieved quickly with NTG.   Other than that one episode, he has been doing well and has had no other CP or SOB.   No current facility-administered medications for this visit.   No current outpatient prescriptions on file.   Facility-Administered Medications Ordered in Other Visits  Medication Dose Route Frequency  Provider Last Rate Last Dose  . acetaminophen (TYLENOL) tablet 650 mg  650 mg Oral Q6H PRN Guy [name]       Or  . acetaminophen (TYLENOL) suppository 650 mg  650 mg Rectal Q6H PRN Guy [name]      . amLODipine (NORVASC) tablet 10 mg  10 mg Oral Daily Guy [name]   10 mg at 12/30/13 1057  . aspirin chewable tablet 81 mg  81 mg Oral Daily Guy [name]   81 mg at 12/30/13 1051  . atorvastatin (LIPITOR) tablet 20 mg  20 mg Oral QHS Guy [name]   20 mg at 12/29/13 2216  . bicalutamide (CASODEX) tablet 50 mg  50 mg Oral Daily Guy [name]   50 mg at 12/30/13 1051  . calcium-vitamin [name] (OSCAL WITH [name]) 500-200 MG-UNIT per tablet 1 tablet  1 tablet Oral BID Guy [name]   1 tablet at 12/30/13 1051  . Chlorhexidine Gluconate Cloth 2 % PADS 6 each  6 each Topical Q0600 Guy [name]   6 each at 12/30/13 0430  . docusate sodium (COLACE) capsule 100 mg  100 mg Oral Q12H Guy [name]   100 mg at 12/30/13 1052  . feeding supplement (GLUCERNA SHAKE) (GLUCERNA SHAKE) liquid 237 mL  237 mL Oral Q24H Guy [name], RD      . fluticasone (FLONASE) 50 MCG/ACT nasal spray 1 spray  1 spray Each Nare BID  Guy [name]   1 spray at 12/30/13 1052  . HYDROcodone-acetaminophen (NORCO/VICODIN) 5-325 MG per tablet 1-2 tablet  1-2 tablet Oral Q4H PRN Guy [name]   2 tablet at 12/30/13 1406  . hydrOXYzine (ATARAX/VISTARIL) tablet 25 mg  25 mg Oral BID Guy [name]   25 mg at 12/30/13 1051  . insulin aspart (novoLOG) injection 0-15 Units  0-15 Units Subcutaneous TID WC Guy [name]   5 Units at 12/30/13 1223  . insulin aspart (novoLOG) injection 0-5 Units  0-5 Units Subcutaneous QHS Guy [name]   2 Units at 12/29/13 2221  . insulin glargine (LANTUS) injection 16 Units  16 Units Subcutaneous QHS Guy [name]   16 Units at 12/29/13 2221  . lipase/protease/amylase (CREON-12/PANCREASE) capsule 4 capsule  4 capsule Oral TID WC Guy [name]   4  capsule at 12/30/13 1223  . metoprolol tartrate (LOPRESSOR) tablet 12.5 mg  12.5 mg Oral Daily Guy [name]   12.5 mg at 12/30/13 1223  . morphine 2 MG/ML injection 2 mg  2 mg Intravenous Q4H PRN Guy [name]      . multivitamin with minerals tablet 1 tablet  1 tablet Oral q morning - 10a Guy [name]   1 tablet at 12/30/13 1052  . mupirocin ointment (BACTROBAN) 2 % 1 application  1 application Nasal BID Guy [name]   1 application at 82/99/37 1052  . nitroGLYCERIN (NITROSTAT) SL tablet 0.4 mg  0.4 mg Sublingual Q5 min PRN Guy [name]      . ondansetron Guy [name]) tablet 4 mg  4 mg Oral Q6H PRN Guy [name]       Or  . ondansetron [facility]) injection 4 mg  4 mg Intravenous Q6H PRN Guy [name]      . oxybutynin (DITROPAN) tablet 5 mg  5 mg Oral QHS Guy [name]   5 mg at 12/29/13 2215  . polyvinyl alcohol (LIQUIFILM TEARS) 1.4 % ophthalmic solution 1 drop  1 drop Both Eyes BID PRN Guy [name]        Allergies  Allergen Reactions  . Albuterol Sulfate Hfa [Kdc:Albuterol] Shortness Of Breath and Swelling  . Adhesive [Tape] Other (See Comments)    REACTION: SKIN BLISTERS  . Albuterol Swelling    Per pt., side of his neck began to swell with nebulized Albuterol in doctor's office.  . Contrast Media [Iodinated Diagnostic Agents] Other (See Comments)    Reaction unknown  . Lactose     Other reaction(s): GI Upset (intolerance)  . Metrizamide Other (See Comments)    Reaction unknown  . Penicillins Other (See Comments) and Itching    REACTION: ITCHING HANDS  . Sulfa Drugs Cross Reactors Hives  . Sulfamethoxazole Rash    History   Social History  . Marital Status: Married    Spouse Name: Guy [name]    Number of Children: 3  . Years of Education: N/A   Occupational History  . Business owner, Press photographer, retired    Social History Main Topics  . Smoking status: Former Research scientist (life sciences)  . Smokeless tobacco: Never Used  . Alcohol Use: No  . Drug  Use: No  . Sexual Activity: No   Other Topics Concern  . Not on file   Social History Narrative   Married.  Lives with his wife.  Ambulates with a walker and a cane.  Review of Systems: General: negative for chills, fever, night sweats or weight changes.  Cardiovascular: negative for chest pain, dyspnea on exertion, edema, orthopnea, palpitations, paroxysmal nocturnal dyspnea or shortness of breath Dermatological: negative for rash Respiratory: negative for cough or wheezing Urologic: negative for hematuria Abdominal: negative for nausea, vomiting, diarrhea, bright red blood per rectum, melena, or hematemesis Neurologic: negative for visual changes, syncope, or dizziness All other systems reviewed and are otherwise negative except as noted above.    Blood pressure 142/68, pulse 58, height 5\' 6"  (1.676 m), weight 132 lb (59.875 kg).  General appearance: alert, cooperative and no distress Neck: no carotid bruit and no JVD Lungs: clear to auscultation bilaterally Heart: regular rate and rhythm, S1, S2 normal, no murmur, click, rub or gallop Extremities: no LEE Pulses: 2+ and symmetric Skin: warm and dry Neurologic: Grossly normal  EKG Sinus Brady, HR 58 bpm. RBBB  ASSESSMENT AND PLAN:   CAD (coronary artery disease) Pt had one episode of left sided chest pain several days ago, relieved with NTG. No further recurrence. Denies any pain with exertion. Continue BB and statin.    PLAN  Will discuss case with Guy [name] to see if patient can be cleared for surgery, or if he will need to undergo repeat ischemic evaluation, in light of his recent chest pain episode. Will notify patient of our recommendations after a decision is made.   Joslynn Jamroz, BRITTAINYPA-C 12/30/2013 3:56 PM

## 2013-12-29 NOTE — Progress Notes (Signed)
  PHARMACIST - PHYSICIAN ORDER COMMUNICATION  CONCERNING: P&T Medication Policy on Herbal Medications  DESCRIPTION:  This patient's order for:  Melatonin has been noted.  This product(s) is classified as an "herbal" or natural product. Due to a lack of definitive safety studies or FDA approval, nonstandard manufacturing practices, plus the potential risk of unknown drug-drug interactions while on inpatient medications, the Pharmacy and Therapeutics Committee does not permit the use of "herbal" or natural products of this type within [facility].   ACTION TAKEN: The pharmacy department is unable to verify this order at this time and your patient has been informed of this safety policy. Please reevaluate patient's clinical condition at discharge and address if the herbal or natural product(s) should be resumed at that time.   Doreene Eland, PharmD, BCPS.   Pager: [phone] 12/29/2013 2:16 PM

## 2013-12-29 NOTE — H&P (Addendum)
[facility] History and Physical  RANIER COACH PIR:[id number] DOB: [birth year] DOA: 12/29/2013  Referring physician: ER physician PCP: [name], MD   Chief Complaint: back pain  HPI:  80 year old male with history of prostate cancer, hypertension ,dyslipidemia, chronic thoracic and lumbar compression fractures who presented to [facility] ED 12/29/2013 with worsening back pain status post fall few days ago at home. Pain is in the mid and lower back, 10/10 in intensity not relieved with home analgesia. Pain is present at rest and on moving. He reported he cant get out of the bed.He was seen in ED at that time but since imaging studies were all negative for fractures he was subsequently discharged home. He has no complaints of chest pain, shortness of breath, palpitations, abdominal pain, nausea or vomiting. No fever or chills. No cough. No blood in stool or urine. No reports of sensation loss, he does have urinary incontinence but this is not a new problem. No other neurologic complaints.  Admission studies included CXR which showed small to moderate layering pleural effusions. Vitals are stable. Troponin level was 0.39 on admission and BNP was 2026. Pt also seen by cardio and they recommended current meds and if possible to add aspirin.   Assessment and Plan:  Principal Problem:   Back pain, fall, lumbar and thoracic compression fractures - seem to be chronic based on studies on 3/30. Will see with IR if pt can have kyphoplasty or vertebroplasty. He already had vertebroplasty in 09/2013 - pain management with morphin 2 mg every 2 hours IV PRM and norco PO PRN Active Problems:   DM (diabetes mellitus) - continue insulin and amaryl per home regimen - also started SSI   Dehydration - may continue low rate hydration, NS @ 50 cc/hr - last 2 D ECHO in 2014 with normal EF; BNP at 2000 on this admission but pt clinically dry   Essential hypertension, benign - continue metoprolol and  norvasc   Hyperlipidemia - continue statin therapy   Elevated troponin - no chest pain , no shortness of breath - BNP 2000 on this admission but clinically dry - will start low dose aspirin per cardio recommendations    History of DVT - on aspirin   Urinary incontinence - continue oxybutynin    Anemia of chronic disease - secondary to history of prostate cancer - hemoglobin stable    Prostate cancer - on casodex   Moderate protein calorie malnutrition - nutrition consulted     Radiological Exams on Admission: Dg Chest 2 View 12/29/2013    IMPRESSION: Small to moderate layering pleural effusions. No other acute cardiopulmonary abnormality identified.     Dg Chest 2 View 12/27/2013    IMPRESSION: Low inspiratory volumes with bibasilar atelectasis and mild vascular congestion.  Otherwise, stable chest x-ray.    Dg Hip Complete Right 12/27/2013   IMPRESSION: No acute fracture identified although marked background osteopenia limits evaluation for nondisplaced fractures.   Ct Head Wo Contrast 12/27/2013     IMPRESSION: CT head:  No acute intracranial process.  Involutional changes. Moderate to severe white matter changes suggest chronic small vessel ischemic disease with bilateral suspected basal ganglia and thalamus remote lacunar infarcts.  CT cervical spine: Accentuated cervical lordosis without acute fracture nor malalignment.   Electronically Signed   By: Elon Alas   On: 12/27/2013 23:33   Ct Cervical Spine Wo Contrast 12/27/2013 IMPRESSION: CT head:  No acute intracranial process.  Involutional changes. Moderate to severe white matter  changes suggest chronic small vessel ischemic disease with bilateral suspected basal ganglia and thalamus remote lacunar infarcts.  CT cervical spine: Accentuated cervical lordosis without acute fracture nor malalignment.     Ct Thoracic Spine Wo Contrast 12/27/2013    IMPRESSION  Severe osteopenia. Nondisplaced right L3 transverse process  fracture may be acute.  Remote L1 and L3 compression fractures, status post vertebroplasty/ cement augmentation at these levels. Grade 1 L4-5 anterolisthesis on degenerative basis.    Ct Lumbar Spine Wo Contrast 12/27/2013    IMPRESSION  Severe osteopenia. Nondisplaced right L3 transverse process fracture may be acute.  Remote L1 and L3 compression fractures, status post vertebroplasty/ cement augmentation at these levels. Grade 1 L4-5 anterolisthesis on degenerative basis.   Code Status: Full Family Communication: wife at bedside Disposition Plan: Admit for further evaluation  Leisa Lenz, MD  [facility] Pager [phone]  Review of Systems:  Constitutional: Negative for fever, chills and malaise/fatigue. Negative for diaphoresis.  HENT: Negative for hearing loss, ear pain, nosebleeds, congestion, sore throat, neck pain, tinnitus and ear discharge.  Eyes: Negative for blurred vision, double vision, photophobia, pain, discharge and redness.  Respiratory: Negative for cough, hemoptysis, sputum production, shortness of breath, wheezing and stridor.   Cardiovascular: Negative for chest pain, palpitations, orthopnea, claudication and leg swelling.  Gastrointestinal: Negative for nausea, vomiting and abdominal pain. Negative for heartburn, constipation, blood in stool and melena.  Genitourinary: Negative for dysuria, urgency, frequency, hematuria and flank pain.  Musculoskeletal: positive for back pain, fall.  Skin: Negative for itching and rash.  Neurological: positive for weakness, no tremors and no change in sensation  Endo/Heme/Allergies: Negative for environmental allergies and polydipsia. Does not bruise/bleed easily.  Psychiatric/Behavioral: Negative for suicidal ideas. The patient is not nervous/anxious.      Past Medical History  Diagnosis Date  . Hypertension   . Diabetes mellitus without complication   . Arthritis   . Sciatica   . GERD (gastroesophageal reflux disease)    . Urge incontinence   . Vertigo   . Hyperlipidemia   . ED (erectile dysfunction)   . Gastric polyposis   . Osteoporosis   . Decreased libido   . Depression   . Hepatitis A   . Stroke   . History of blood transfusion     1946  . OA (osteoarthritis)   . Bladder cancer   . Prostate cancer     S/P prostatectomy; Lung metastasis  . CAD (coronary artery disease) 1996    stent to the LAD   . Hyperlipidemia   . Type 2 diabetes mellitus    Past Surgical History  Procedure Laterality Date  . Carpal tunnel release    . Cataract extraction w/ intraocular lens  implant, bilateral  1998  . Prostatectomy    . Urinary sphincter implant    . Urinary sphincter implant revision    . Appendectomy    . Left hip surgery      Donated bone for bone graft to arm  . Penile prosthesis implant      S/P removal and re-implantation of new prosthesis  . Middle ear surgery    . Finger surgery      Left and right  . Hernia repair    . Bladder surgery    . Right arm bone graft      Pathological fracture  . Cardiac catheterization  11/27/2001    patent stent with mild in-stent restenosis.  . Myoview  06/12/2009    Persantine myoview  EF 76%; Normal myoview  . Coronary angioplasty  1996    stenting to the LAD in [place].    Social History:  reports that he has quit smoking. He has never used smokeless tobacco. He reports that he does not drink alcohol or use illicit drugs.  Allergies  Allergen Reactions  . Albuterol Sulfate Hfa [Kdc:Albuterol] Shortness Of Breath and Swelling  . Adhesive [Tape] Other (See Comments)    REACTION: SKIN BLISTERS  . Albuterol Swelling    Per pt., side of his neck began to swell with nebulized Albuterol in doctor's office.  . Contrast Media [Iodinated Diagnostic Agents] Other (See Comments)    Reaction unknown  . Lactose     Other reaction(s): GI Upset (intolerance)  . Metrizamide Other (See Comments)    Reaction unknown  . Penicillins Other (See Comments) and  Itching    REACTION: ITCHING HANDS  . Sulfa Drugs Cross Reactors Hives  . Sulfamethoxazole Rash   Family History  Problem Relation Age of Onset  . Heart failure Mother   . Bladder Cancer Father   . Pancreatic cancer Sister   . Lung cancer Sister   . Breast cancer Daughter   . Liver disease Son      Prior to Admission medications   Medication Sig Start Date End Date Taking? Authorizing Provider  amLODipine (NORVASC) 10 MG tablet Take 10 mg by mouth daily.   Yes Historical Provider, MD  bicalutamide (CASODEX) 50 MG tablet Take 50 mg by mouth daily.   Yes Historical Provider, MD  Calcium Carbonate-Vitamin D (CALCIUM 600+D HIGH POTENCY) 600-400 MG-UNIT per tablet Take 1 tablet by mouth 2 (two) times daily.     Yes Historical Provider, MD  carboxymethylcellulose (REFRESH PLUS) 0.5 % SOLN Place 1 drop into both eyes 2 (two) times daily as needed (for dry eyes).   Yes Historical Provider, MD  ciprofloxacin (CIPRO) 500 MG tablet Take 500 mg by mouth 2 (two) times daily. Starting 12/28/13 for 7 days. 12/28/13  Yes Kristen N Ward, DO  docusate sodium (COLACE) 100 MG capsule Take 1 capsule (100 mg total) by mouth every 12 (twelve) hours. 12/28/13  Yes Kristen N Ward, DO  fluticasone (FLONASE) 50 MCG/ACT nasal spray Place 2 sprays into the nose 2 (two) times daily.   Yes Historical Provider, MD  glimepiride (AMARYL) 4 MG tablet Take 4 mg by mouth daily before breakfast.   Yes Historical Provider, MD  HYDROcodone-acetaminophen (NORCO/VICODIN) 5-325 MG per tablet Take 1 tablet by mouth every 6 (six) hours as needed. 12/28/13  Yes Kristen N Ward, DO  hydrOXYzine (ATARAX/VISTARIL) 25 MG tablet Take 25 mg by mouth 2 (two) times daily.    Yes Historical Provider, MD  insulin aspart (NOVOLOG) 100 UNIT/ML injection Inject 0-6 Units into the skin 3 (three) times daily before meals. 0-150=0; 151-200=2u; 201-250=3u; 251-300=4u; 301-350=5u; over 350 =6u prior to meals 01/18/13  Yes Gerlene Fee, NP  insulin  glargine (LANTUS) 100 UNIT/ML injection Inject 16 Units into the skin at bedtime.    Yes Historical Provider, MD  lipase/protease/amylase (CREON-12/PANCREASE) 12000 UNITS CPEP Take 4 capsules by mouth 3 (three) times daily.   Yes Historical Provider, MD  Melatonin 3 MG TABS Take 3 mg by mouth at bedtime.   Yes Historical Provider, MD  metoprolol (LOPRESSOR) 50 MG tablet Take 50 mg by mouth daily.   Yes Historical Provider, MD  Multiple Vitamins-Minerals (MULTIVITAMINS THER. W/MINERALS) TABS Take 1 tablet by mouth every morning.  Yes Historical Provider, MD  nitroGLYCERIN (NITROSTAT) 0.4 MG SL tablet Place 0.4 mg under the tongue every 5 (five) minutes as needed for chest pain.    Yes Historical Provider, MD  oxybutynin (DITROPAN) 5 MG tablet Take 5 mg by mouth at bedtime.   Yes Historical Provider, MD  simvastatin (ZOCOR) 40 MG tablet Take 40 mg by mouth at bedtime.   Yes Historical Provider, MD   Physical Exam: Filed Vitals:   12/29/13 1245 12/29/13 1300 12/29/13 1305 12/29/13 1418  BP: 156/76 147/78  146/85  Pulse: 78 79 79 78  Temp:    98.2 F (36.8 C)  TempSrc:    Oral  Resp: 23 18 21 20   Height:    5\' 6"  (1.676 m)  Weight:    57.5 kg (126 lb 12.2 oz)  SpO2:  88% 99% 99%    Physical Exam  Constitutional: Appears malnourished, no acute distress HENT: Normocephalic. Dry mucus membranes   Eyes: Conjunctivae normal, PERRLA Neck: Normal ROM. Neck supple. No JVD. No tracheal deviation CVS: RRR, S1/S2 appreciated Pulmonary: diminished breath sounds, no wheezing .  Abdominal: Soft. BS +,  no distension, tenderness, rebound or guarding.  Musculoskeletal: Limited range of motion in lower extremities. No edema and no tenderness.  Lymphadenopathy: No lymphadenopathy noted, cervical, inguinal. Neuro: Alert. No focal neurologic deficits  Skin: Skin is warm and dry.  Psychiatric: Normal mood and affect.   Labs on Admission:  Basic Metabolic Panel:  Recent Labs Lab 12/27/13 2208  12/29/13 1120  NA 135* 137  K 4.0 3.9  CL 97 99  CO2 24 23  GLUCOSE 159* 223*  BUN 24* 24*  CREATININE 1.39* 1.43*  CALCIUM 10.2 9.5   Liver Function Tests: No results found for this basename: AST, ALT, ALKPHOS, BILITOT, PROT, ALBUMIN,  in the last 168 hours No results found for this basename: LIPASE, AMYLASE,  in the last 168 hours No results found for this basename: AMMONIA,  in the last 168 hours CBC:  Recent Labs Lab 12/27/13 2208 12/29/13 1120  WBC 11.8* 9.2  NEUTROABS 9.9* 6.9  HGB 13.9 13.1  HCT 41.1 38.8*  MCV 95.6 95.8  PLT 132* 110*   Cardiac Enzymes: No results found for this basename: CKTOTAL, CKMB, CKMBINDEX, TROPONINI,  in the last 168 hours BNP: No components found with this basename: POCBNP,  CBG: No results found for this basename: GLUCAP,  in the last 168 hours  If 7PM-7AM, please contact night-coverage [email or web address] Password [email or web address] 12/29/2013, 4:59 PM

## 2013-12-29 NOTE — Consult Note (Addendum)
Admit date: 12/29/2013 Referring Physician  Dr. Leonides Schanz Primary Physician [name], MD Primary Cardiologist  Lorretta Harp MD [name]  Reason for Consultation: Elevated troponin  HPI: 80 year old male with history of coronary artery disease status post remote LAD stenting in 1996 in [place], cardiac catheterization in 2003 by Dr. Gwenlyn Found revealing patent LAD stent with mild in-stent restenosis with nuclear stress test in September of 2010 showing no evidence of ischemia here in the emergency department after fall, head injury and back pain.  He is had multiple recent falls with lumbar compression fracture status post kyphoplasty to L1 and L3. In the legs with a walker. Balanced/disequilibrium. Last night, while getting off of toilet, he fell backwards striking his head. No loss of consciousness.  While in the emergency department, troponin was drawn and was 0.39. His BNP also was elevated at 2000. Creatinine is 1.43 with a GFR of 41. Hemoglobin 13.  Chest x-ray showed small pleural effusion.  EKG demonstrates right bundle branch block, left anterior fascicular block, no acute ST segment changes.  Currently he is laying comfortably in bed with no shortness of breath. When asked about chest discomfort, he denies but he does state that his back hurts him when he turns. This is chronic for him.   PMH:   Past Medical History  Diagnosis Date  . Hypertension   . Diabetes mellitus without complication   . Arthritis   . Sciatica   . GERD (gastroesophageal reflux disease)   . Urge incontinence   . Vertigo   . Hyperlipidemia   . ED (erectile dysfunction)   . Gastric polyposis   . Osteoporosis   . Decreased libido   . Depression   . Hepatitis A   . Stroke   . History of blood transfusion     1946  . OA (osteoarthritis)   . Bladder cancer   . Prostate cancer     S/P prostatectomy; Lung metastasis  . CAD (coronary artery disease) 1996    stent to the LAD    . Hyperlipidemia   . Type 2 diabetes mellitus     PSH:   Past Surgical History  Procedure Laterality Date  . Carpal tunnel release    . Cataract extraction w/ intraocular lens  implant, bilateral  1998  . Prostatectomy    . Urinary sphincter implant    . Urinary sphincter implant revision    . Appendectomy    . Left hip surgery      Donated bone for bone graft to arm  . Penile prosthesis implant      S/P removal and re-implantation of new prosthesis  . Middle ear surgery    . Finger surgery      Left and right  . Hernia repair    . Bladder surgery    . Right arm bone graft      Pathological fracture  . Cardiac catheterization  11/27/2001    patent stent with mild in-stent restenosis.  . Myoview  06/12/2009    Persantine myoview EF 76%; Normal myoview  . Coronary angioplasty  1996    stenting to the LAD in [place].    Allergies:  Albuterol sulfate hfa; Adhesive; Albuterol; Contrast media; Lactose; Metrizamide; Penicillins; Sulfa drugs cross reactors; and Sulfamethoxazole Prior to Admit Meds:   Prior to Admission medications   Medication Sig Start Date End Date Taking? Authorizing Provider  amLODipine (NORVASC) 10 MG tablet Take 10 mg by mouth daily.  Yes Historical Provider, MD  bicalutamide (CASODEX) 50 MG tablet Take 50 mg by mouth daily.   Yes Historical Provider, MD  Calcium Carbonate-Vitamin D (CALCIUM 600+D HIGH POTENCY) 600-400 MG-UNIT per tablet Take 1 tablet by mouth 2 (two) times daily.     Yes Historical Provider, MD  carboxymethylcellulose (REFRESH PLUS) 0.5 % SOLN Place 1 drop into both eyes 2 (two) times daily as needed (for dry eyes).   Yes Historical Provider, MD  ciprofloxacin (CIPRO) 500 MG tablet Take 500 mg by mouth 2 (two) times daily. Starting 12/28/13 for 7 days. 12/28/13  Yes Kristen N Ward, DO  docusate sodium (COLACE) 100 MG capsule Take 1 capsule (100 mg total) by mouth every 12 (twelve) hours. 12/28/13  Yes Kristen N Ward, DO  fluticasone (FLONASE)  50 MCG/ACT nasal spray Place 2 sprays into the nose 2 (two) times daily.   Yes Historical Provider, MD  glimepiride (AMARYL) 4 MG tablet Take 4 mg by mouth daily before breakfast.   Yes Historical Provider, MD  HYDROcodone-acetaminophen (NORCO/VICODIN) 5-325 MG per tablet Take 1 tablet by mouth every 6 (six) hours as needed. 12/28/13  Yes Kristen N Ward, DO  hydrOXYzine (ATARAX/VISTARIL) 25 MG tablet Take 25 mg by mouth 2 (two) times daily.    Yes Historical Provider, MD  insulin aspart (NOVOLOG) 100 UNIT/ML injection Inject 0-6 Units into the skin 3 (three) times daily before meals. 0-150=0; 151-200=2u; 201-250=3u; 251-300=4u; 301-350=5u; over 350 =6u prior to meals 01/18/13  Yes Gerlene Fee, NP  insulin glargine (LANTUS) 100 UNIT/ML injection Inject 16 Units into the skin at bedtime.    Yes Historical Provider, MD  lipase/protease/amylase (CREON-12/PANCREASE) 12000 UNITS CPEP Take 4 capsules by mouth 3 (three) times daily.   Yes Historical Provider, MD  Melatonin 3 MG TABS Take 3 mg by mouth at bedtime.   Yes Historical Provider, MD  metoprolol (LOPRESSOR) 50 MG tablet Take 50 mg by mouth daily.   Yes Historical Provider, MD  Multiple Vitamins-Minerals (MULTIVITAMINS THER. W/MINERALS) TABS Take 1 tablet by mouth every morning.    Yes Historical Provider, MD  nitroGLYCERIN (NITROSTAT) 0.4 MG SL tablet Place 0.4 mg under the tongue every 5 (five) minutes as needed for chest pain.    Yes Historical Provider, MD  oxybutynin (DITROPAN) 5 MG tablet Take 5 mg by mouth at bedtime.   Yes Historical Provider, MD  simvastatin (ZOCOR) 40 MG tablet Take 40 mg by mouth at bedtime.   Yes Historical Provider, MD   Fam HX:    Family History  Problem Relation Age of Onset  . Heart failure Mother   . Bladder Cancer Father   . Pancreatic cancer Sister   . Lung cancer Sister   . Breast cancer Daughter   . Liver disease Son    Social HX:    History   Social History  . Marital Status: Married    Spouse  Name: [name]    Number of Children: 3  . Years of Education: N/A   Occupational History  . Business owner, Press photographer, retired    Social History Main Topics  . Smoking status: Former Research scientist (life sciences)  . Smokeless tobacco: Never Used  . Alcohol Use: No  . Drug Use: No  . Sexual Activity: No   Other Topics Concern  . Not on file   Social History Narrative   Married.  Lives with his wife.  Ambulates with a walker and a cane.     ROS:  All 11 ROS were  addressed and are negative except what is stated in the HPI. He denies syncope, overt chest pain, orthopnea, PND, palpitations. He does have trouble chewing from poor dentition. Positive fall. Positive back pain. No bleeding.   Physical Exam: Blood pressure 147/78, pulse 79, temperature 98.1 F (36.7 C), temperature source Oral, resp. rate 21, SpO2 99.00%.   General: Elderly, thin, frail, in no acute distress Head: Eyes PERRLA, No xanthomas. Poor dentition.   Normal cephalic and atramatic  Lungs:   Clear bilaterally to auscultation and percussion. Normal respiratory effort. No wheezes, no rales. Barrel chested Heart:   HRRR S1 S2 Pulses are 2+ & equal. No murmur, rubs, gallops.  No carotid bruit. No JVD.  No abdominal bruits.  Abdomen: Bowel sounds are positive, abdomen soft and non-tender without masses. No hepatosplenomegaly. Msk:  Moves all extremities. Extremities:  No clubbing, cyanosis or edema.  DP +1 Neuro: Alert and oriented X 3, non-focal, MAE x 4 GU: Deferred Rectal: Deferred Psych:  Good affect, responds appropriately      Labs: Lab Results  Component Value Date   WBC 9.2 12/29/2013   HGB 13.1 12/29/2013   HCT 38.8* 12/29/2013   MCV 95.8 12/29/2013   PLT 110* 12/29/2013     Recent Labs Lab 12/29/13 1120  NA 137  K 3.9  CL 99  CO2 23  BUN 24*  CREATININE 1.43*  CALCIUM 9.5  GLUCOSE 223*    Lab Results  Component Value Date   CHOL 80 12/30/2012   HDL 26* 12/30/2012   LDLCALC 37 12/30/2012   TRIG 85 12/30/2012   Lab Results    Component Value Date   DDIMER 8.67* 12/29/2012     Radiology:  Dg Chest 2 View  12/29/2013   CLINICAL DATA:  80 year old male shortness of breath and back pain. Initial encounter.  EXAM: CHEST  2 VIEW  COMPARISON:  12/27/2013 and earlier.  FINDINGS: Stable lung volumes. Stable cardiac size and mediastinal contours. Small to moderate bilateral pleural effusions, dependent. No pneumothorax. No pulmonary edema. No superimposed confluent pulmonary opacity. Osteopenia. Spinal compression fractures, some previously augmented. Chronic proximal right humerus deformity. Subacute to chronic left lateral rib fractures.  IMPRESSION: Small to moderate layering pleural effusions. No other acute cardiopulmonary abnormality identified.   Electronically Signed   By: Lars Pinks M.D.   On: 12/29/2013 11:38   Dg Chest 2 View  12/27/2013   CLINICAL DATA:  Fall, right-sided chest pain  EXAM: CHEST  2 VIEW  COMPARISON:  Prior chest x-ray and right rib series 1 09/2013  FINDINGS: Low inspiratory volumes. Nodular opacities in the left mid lung are again noted. Stable cardiac and mediastinal contours. Background bronchitic changes and interstitial prominence grossly similar compared to prior. No pneumothorax or large effusion. Pulmonary vascular congestion without overt edema. No definite acute fracture. Remote healed right proximal humeral fracture. Osteoarthritis of the bilateral acromioclavicular joints. No acute osseous abnormality. The bones are diffusely osteopenic.  IMPRESSION: Low inspiratory volumes with bibasilar atelectasis and mild vascular congestion.  Otherwise, stable chest x-ray.   Electronically Signed   By: Jacqulynn Cadet M.D.   On: 12/27/2013 23:20   Dg Hip Complete Right  12/27/2013   CLINICAL DATA:  Fall, right hip pain  EXAM: RIGHT HIP - COMPLETE 2+ VIEW  COMPARISON:  Prior radiographs of the pelvis and right hip 06/24/2013  FINDINGS: The bones are diffusely osteopenic. This limits evaluation for  nondisplaced fractures considerably. No definite fracture identified. Prior lower lumbar compression fractures status  post vertebroplasty. Numerous surgical clips project over the anatomic pelvis. Scattered atherosclerotic vascular calcifications. Calcifications of the bilateral vas deferens noted incidentally. Osseous excrescence arising from the lateral aspect of the left ilium a similar compared to prior.  IMPRESSION: No acute fracture identified although marked background osteopenia limits evaluation for nondisplaced fractures.   Electronically Signed   By: Jacqulynn Cadet M.D.   On: 12/27/2013 23:17   Ct Head Wo Contrast  12/27/2013   CLINICAL DATA:  Fall, headache and neck pain, back pain.  EXAM: CT HEAD WITHOUT CONTRAST  CT CERVICAL SPINE WITHOUT CONTRAST  TECHNIQUE: Multidetector CT imaging of the head and cervical spine was performed following the standard protocol without intravenous contrast. Multiplanar CT image reconstructions of the cervical spine were also generated.  COMPARISON:  CT HEAD W/O CM dated 03/21/2013; DG CERVICAL SPINE 2-3 VIEWS dated 10/07/2013  FINDINGS: CT HEAD FINDINGS  The ventricles and sulci are normal for age. No intraparenchymal hemorrhage, mass effect nor midline shift. Confluent supratentorial white matter hypodensities are within normal range for patient's age and though non-specific suggest sequelae of chronic small vessel ischemic disease. No acute large vascular territory infarcts. Multiple tiny T2 hyperintensities within the basal ganglia and thalamus likely reflect remote lacunar infarcts.  No abnormal extra-axial fluid collections. Basal cisterns are patent. Moderate calcific atherosclerosis of the carotid siphons and included vertebral arteries.  No skull fracture. Status post bilateral ocular lens implants. Right maxillary mucosal thickening and bony wall changes consistent chronic sinusitis. Mild ethmoid mucosal thickening. Mastoid air cells are well aerated. Severe  bilateral temporomandibular osteoarthrosis. Status post bilateral ocular lens implants.  CT CERVICAL SPINE FINDINGS  Cervical vertebral bodies and posterior elements appear intact with accentuated cervical lordosis. The C3-4 facets are fused on degenerative basis. C1-2 articulation maintained with severe arthropathy. Calcified pannus about the odontoid process likely reflects CPPD. Severe C3-4 thru C5-6 degenerative disc disease. Bone mineral density is decreased without destructive bony lesions. Severe calcific atherosclerosis of the left carotid bulb, if clinical concern for hemodynamically significant stenosis, carotid ultrasound or CT angiogram would be more sensitive. Included view of the lung apices demonstrate calcified granulomas.  Degenerative disc disease and facet arthropathy result in mild canal stenosis C4-5 and C5-6. Moderate C4-5 and C5-6 neural foraminal narrowing.  IMPRESSION: CT head:  No acute intracranial process.  Involutional changes. Moderate to severe white matter changes suggest chronic small vessel ischemic disease with bilateral suspected basal ganglia and thalamus remote lacunar infarcts.  CT cervical spine: Accentuated cervical lordosis without acute fracture nor malalignment.   Electronically Signed   By: Elon Alas   On: 12/27/2013 23:33   Ct Cervical Spine Wo Contrast  12/27/2013   CLINICAL DATA:  Fall, headache and neck pain, back pain.  EXAM: CT HEAD WITHOUT CONTRAST  CT CERVICAL SPINE WITHOUT CONTRAST  TECHNIQUE: Multidetector CT imaging of the head and cervical spine was performed following the standard protocol without intravenous contrast. Multiplanar CT image reconstructions of the cervical spine were also generated.  COMPARISON:  CT HEAD W/O CM dated 03/21/2013; DG CERVICAL SPINE 2-3 VIEWS dated 10/07/2013  FINDINGS: CT HEAD FINDINGS  The ventricles and sulci are normal for age. No intraparenchymal hemorrhage, mass effect nor midline shift. Confluent supratentorial  white matter hypodensities are within normal range for patient's age and though non-specific suggest sequelae of chronic small vessel ischemic disease. No acute large vascular territory infarcts. Multiple tiny T2 hyperintensities within the basal ganglia and thalamus likely reflect remote lacunar infarcts.  No abnormal  extra-axial fluid collections. Basal cisterns are patent. Moderate calcific atherosclerosis of the carotid siphons and included vertebral arteries.  No skull fracture. Status post bilateral ocular lens implants. Right maxillary mucosal thickening and bony wall changes consistent chronic sinusitis. Mild ethmoid mucosal thickening. Mastoid air cells are well aerated. Severe bilateral temporomandibular osteoarthrosis. Status post bilateral ocular lens implants.  CT CERVICAL SPINE FINDINGS  Cervical vertebral bodies and posterior elements appear intact with accentuated cervical lordosis. The C3-4 facets are fused on degenerative basis. C1-2 articulation maintained with severe arthropathy. Calcified pannus about the odontoid process likely reflects CPPD. Severe C3-4 thru C5-6 degenerative disc disease. Bone mineral density is decreased without destructive bony lesions. Severe calcific atherosclerosis of the left carotid bulb, if clinical concern for hemodynamically significant stenosis, carotid ultrasound or CT angiogram would be more sensitive. Included view of the lung apices demonstrate calcified granulomas.  Degenerative disc disease and facet arthropathy result in mild canal stenosis C4-5 and C5-6. Moderate C4-5 and C5-6 neural foraminal narrowing.  IMPRESSION: CT head:  No acute intracranial process.  Involutional changes. Moderate to severe white matter changes suggest chronic small vessel ischemic disease with bilateral suspected basal ganglia and thalamus remote lacunar infarcts.  CT cervical spine: Accentuated cervical lordosis without acute fracture nor malalignment.   Electronically Signed    By: Elon Alas   On: 12/27/2013 23:33   Ct Thoracic Spine Wo Contrast  12/27/2013   CLINICAL DATA:  Fall, back pain.  Status post L1 kyphoplasty.  EXAM: CT THORACIC AND LUMBAR SPINE WITHOUT CONTRAST  TECHNIQUE: Multidetector CT imaging of the thoracic and lumbar spine was performed without contrast. Multiplanar CT image reconstructions were also generated.  COMPARISON:  DG CHEST 2 VIEW dated 12/27/2013; IR KYPHO VERTEBRAL LUMBAR AUGMENTATION dated 10/12/2013; CT L SPINE W/O CM dated 10/07/2013  FINDINGS: CT THORACIC SPINE FINDINGS  Severe at T2 and T3 burst fractures, which were present on prior chest radiographs, with T2 vertebra plana ; at least 3 mm of retropulsed bony fragments at T2 and T3 without canal stenosis. At least 75% T3 vertebral body height loss. Very mild T4 compression fracture, mild T8 compression fracture with less than 25% height loss. Very mild T12 suspected compression fracture. No malalignment. Accentuated upper thoracic kyphosis.  Severe osteopenia. No destructive bony lesions. Intervertebral disc heights generally preserved with upper thoracic ventral endplate spurring. Apical calcified granulomas. Included prevertebral and paraspinal soft tissues are nonsuspicious.  CT LUMBAR SPINE FINDINGS  Transitional anatomy with sacralized L5 vertebral body. Severe osteopenia. Moderate to severe L1 burst fracture status post vertebral body cement augmentation. Healing right L1 transverse process fracture. Moderate L3 vertebral body fracture and cement augmentation. Right L3 nondisplaced transverse process fracture appears new from prior imaging. Grade 1 L4-5 anterolisthesis on degenerative basis. Lumbar lordosis maintained. Severe osteopenia without destructive bony lesions.  Moderate calcific atherosclerosis of the aorta. Moderate symmetric paraspinal muscle atrophy. Retroperitoneal surgical clips. Status postcholecystectomy. Right upper pole 3.4 cm renal cyst.  Degenerative disc disease and  facet arthropathy result in mild canal stenosis L2-3, moderate at L3-4 and moderate to severe L4-5. Moderate to severe right L3-4, moderate right L4-5 neural foraminal narrowing. Severe left L1-2, L3-4 and moderate left L4-5 neural foraminal narrowing.  IMPRESSION: CT THORACIC SPINE IMPRESSION  Severe osteopenia. Multiple thoracic compression fractures including severe T2 and T3 burst fractures which appear chronic, without canal stenosis, or malalignment. Accentuated upper thoracic kyphosis.  CT LUMBAR SPINE IMPRESSION  Severe osteopenia. Nondisplaced right L3 transverse process fracture may be acute.  Remote L1 and L3 compression fractures, status post vertebroplasty/ cement augmentation at these levels. Grade 1 L4-5 anterolisthesis on degenerative basis.   Electronically Signed   By: Elon Alas   On: 12/27/2013 23:53   Ct Lumbar Spine Wo Contrast  12/27/2013   CLINICAL DATA:  Fall, back pain.  Status post L1 kyphoplasty.  EXAM: CT THORACIC AND LUMBAR SPINE WITHOUT CONTRAST  TECHNIQUE: Multidetector CT imaging of the thoracic and lumbar spine was performed without contrast. Multiplanar CT image reconstructions were also generated.  COMPARISON:  No comparisons  FINDINGS: CT THORACIC SPINE FINDINGS  Severe at T2 and T3 burst fractures, which were present on prior chest radiographs, with T2 vertebra plana ; at least 3 mm of retropulsed bony fragments at T2 and T3 without canal stenosis. At least 75% T3 vertebral body height loss. Very mild T4 compression fracture, mild T8 compression fracture with less than 25% height loss. Very mild T12 suspected compression fracture. No malalignment. Accentuated upper thoracic kyphosis.  Severe osteopenia. No destructive bony lesions. Intervertebral disc heights generally preserved with upper thoracic ventral endplate spurring. Apical calcified granulomas. Included prevertebral and paraspinal soft tissues are nonsuspicious.  CT LUMBAR SPINE FINDINGS  Transitional anatomy  with sacralized L5 vertebral body. Severe osteopenia. Moderate to severe L1 burst fracture status post vertebral body cement augmentation. Healing right L1 transverse process fracture. Moderate L3 vertebral body fracture and cement augmentation. Right L3 nondisplaced transverse process fracture appears new from prior imaging. Grade 1 L4-5 anterolisthesis on degenerative basis. Lumbar lordosis maintained. Severe osteopenia without destructive bony lesions.  Moderate calcific atherosclerosis of the aorta. Moderate symmetric paraspinal muscle atrophy. Retroperitoneal surgical clips. Status postcholecystectomy. Right upper pole 3.4 cm renal cyst.  Degenerative disc disease and facet arthropathy result in mild canal stenosis L2-3, moderate at L3-4 and moderate to severe L4-5. Moderate to severe right L3-4, moderate right L4-5 neural foraminal narrowing. Severe left L1-2, L3-4 and moderate left L4-5 neural foraminal narrowing.  IMPRESSION: CT THORACIC SPINE IMPRESSION  Severe osteopenia. Multiple thoracic compression fractures including severe T2 and T3 burst fractures which appear chronic, without canal stenosis, or malalignment. Accentuated upper thoracic kyphosis.  CT LUMBAR SPINE IMPRESSION  Severe osteopenia. Nondisplaced right L3 transverse process fracture may be acute.  Remote L1 and L3 compression fractures, status post vertebroplasty/ cement augmentation at these levels. Grade 1 L4-5 anterolisthesis on degenerative basis.   Electronically Signed   By: Elon Alas   On: 12/27/2013 23:51   Personally reviewed chest x-ray.  EKG:  As described above Personally viewed.   ASSESSMENT/PLAN:    80 year old male with coronary artery disease status post LAD stent with mechanical fall. Lab work revealed elevated BNP as well as mildly positive troponin.  1. Positive troponin-it is likely that this is not true acute coronary syndrome but a result of his recent overwhelming body stress as a result of significant  fall. I would not advocate for any further cardiac testing at this point. Given his recent fall, I do believe that the risks of placing him on heparin full dose outweigh the benefits. Continue now with medical management. Add back his home aspirin when able. Continue beta blocker.  2. Elevated BNP-previous ejection fraction normal. It is possible that he has a mild degree of diastolic dysfunction although clinically he was not suffering from shortness of breath or orthopnea prior to admission/fall. His chest x-ray does demonstrate small pleural effusions. Given his lack of clinical symptoms of heart failure, I would continue with current medical treatment.  3. Coronary artery disease-status post LAD stent in 1996. Consider addition of aspirin it feasible.  4. Hyperlipidemia-consider changing him over to atorvastatin as outpatient given his concomitant amlodipine.  5. Protein calorie malnutrition-encourage calorie intake.  His wife asked about dental implant procedure. He does not seem very interested in proceeding with this. I would be very hesitant about placing him under general anesthesia in the near future as he is at increased risk from a cardiovascular perspective.  Please call is if any further questions.  Candee Furbish, MD  12/29/2013  1:58 PM

## 2013-12-29 NOTE — Assessment & Plan Note (Addendum)
Pt had one episode of left sided chest pain several days ago, relieved with NTG. No further recurrence. Denies any pain with exertion. Continue BB and statin.

## 2013-12-29 NOTE — ED Notes (Signed)
Report given to Tara, RN.

## 2013-12-29 NOTE — ED Notes (Signed)
Bed: RQ41 Expected date:  Expected time:  Means of arrival:  Comments: 80 y/o M back pain

## 2013-12-29 NOTE — ED Notes (Addendum)
Per ems pt is from home. Pt reports back surgery 1 month ago, pt fell 2 days ago on Monday. Seen in ED on Monday. Pt says discharge notes said if pt had any  Pain to come back to ED. Today pain 10/10 when he moves. No pain at rest. Pt freezes and does not move during movement on stretcher and then says he feels slight SOB. ems reports they say pt hold breath during movement.   Given Cipro, norco, and a stool softer on Monday. Pt urine is smells strong and foul.

## 2013-12-30 ENCOUNTER — Inpatient Hospital Stay (HOSPITAL_COMMUNITY): Payer: Medicare Other

## 2013-12-30 ENCOUNTER — Encounter: Payer: Self-pay | Admitting: Cardiology

## 2013-12-30 DIAGNOSIS — M8448XA Pathological fracture, other site, initial encounter for fracture: Secondary | ICD-10-CM | POA: Diagnosis not present

## 2013-12-30 DIAGNOSIS — S32009A Unspecified fracture of unspecified lumbar vertebra, initial encounter for closed fracture: Secondary | ICD-10-CM

## 2013-12-30 LAB — URINE CULTURE: Colony Count: >=100000

## 2013-12-30 LAB — GLUCOSE, CAPILLARY
GLUCOSE-CAPILLARY: 201 mg/dL — AB (ref 70–99)
GLUCOSE-CAPILLARY: 166 mg/dL — AB (ref 70–99)
Glucose-Capillary: 135 mg/dL — ABNORMAL HIGH (ref 70–99)
Glucose-Capillary: 209 mg/dL — ABNORMAL HIGH (ref 70–99)

## 2013-12-30 LAB — CBC
HCT: 37.9 % — ABNORMAL LOW (ref 39.0–52.0)
Hemoglobin: 12.6 g/dL — ABNORMAL LOW (ref 13.0–17.0)
MCH: 32.2 pg (ref 26.0–34.0)
MCHC: 33.2 g/dL (ref 30.0–36.0)
MCV: 96.9 fL (ref 78.0–100.0)
PLATELETS: 123 10*3/uL — AB (ref 150–400)
RBC: 3.91 MIL/uL — AB (ref 4.22–5.81)
RDW: 13.5 % (ref 11.5–15.5)
WBC: 8.1 10*3/uL (ref 4.0–10.5)

## 2013-12-30 LAB — COMPREHENSIVE METABOLIC PANEL
ALBUMIN: 2.7 g/dL — AB (ref 3.5–5.2)
ALT: 64 U/L — ABNORMAL HIGH (ref 0–53)
AST: 120 U/L — AB (ref 0–37)
Alkaline Phosphatase: 343 U/L — ABNORMAL HIGH (ref 39–117)
BILIRUBIN TOTAL: 1.6 mg/dL — AB (ref 0.3–1.2)
BUN: 25 mg/dL — ABNORMAL HIGH (ref 6–23)
CHLORIDE: 103 meq/L (ref 96–112)
CO2: 26 mEq/L (ref 19–32)
CREATININE: 1.28 mg/dL (ref 0.50–1.35)
Calcium: 8.8 mg/dL (ref 8.4–10.5)
GFR calc Af Amer: 55 mL/min — ABNORMAL LOW (ref >90)
GFR calc non Af Amer: 47 mL/min — ABNORMAL LOW (ref >90)
Glucose, Bld: 141 mg/dL — ABNORMAL HIGH (ref 70–99)
POTASSIUM: 3.7 meq/L (ref 3.7–5.3)
SODIUM: 139 meq/L (ref 137–147)
Total Protein: 6.9 g/dL (ref 6.0–8.3)

## 2013-12-30 LAB — TROPONIN I
TROPONIN I: 0.32 ng/mL — AB (ref <0.30)
Troponin I: <0.3 ng/mL (ref <0.30)
Troponin I: <0.3 ng/mL (ref <0.30)

## 2013-12-30 MED ORDER — GLUCERNA SHAKE PO LIQD
237.0000 mL | ORAL | Status: DC
  Administered 2013-12-31 – 2014-01-03 (×4): 237 mL via ORAL
  Filled 2013-12-30 (×7): qty 237

## 2013-12-30 MED ORDER — METOPROLOL TARTRATE 12.5 MG HALF TABLET: 12.5000 mg | ORAL_TABLET | Freq: Every day | ORAL | Status: DC

## 2013-12-30 MED ORDER — MORPHINE SULFATE 2 MG/ML IJ SOLN
2.0000 mg | INTRAMUSCULAR | Status: DC | PRN
  Administered 2013-12-31 – 2014-01-04 (×5): 2 mg via INTRAVENOUS
  Filled 2013-12-30 (×6): qty 1

## 2013-12-30 MED ORDER — METOPROLOL TARTRATE 12.5 MG HALF TABLET
12.5000 mg | ORAL_TABLET | Freq: Every day | ORAL | Status: DC
  Administered 2013-12-30 – 2014-01-05 (×6): 12.5 mg via ORAL
  Filled 2013-12-30 (×7): qty 1

## 2013-12-30 MED ORDER — BOOST / RESOURCE BREEZE PO LIQD
1.0000 | Freq: Two times a day (BID) | ORAL | Status: DC
  Administered 2013-12-30: 1 via ORAL

## 2013-12-30 NOTE — Progress Notes (Addendum)
INITIAL NUTRITION ASSESSMENT  DOCUMENTATION CODES Per approved criteria  -Not applicable    INTERVENTION: -Recommend Resource Breeze BID as pt with lactose allergy; modify to Ensure Complete as requested as pt has hx of consuming/tolerating Ensure w/out difficulty -Will continue to monitor  NUTRITION DIAGNOSIS: Inadequate oral intake related to early satiety/decreased appetite as evidenced by PO intake <75%, 15-20 lbs weight loss in three months.   Goal: Pt to meet >/= 90% of their estimated nutrition needs    Monitor:  Total protein/energy intake, labs, weights, swallow profile  Reason for Assessment: Consult  81 y.o. male  Admitting Dx: Back pain  ASSESSMENT: 80 year old male with history of prostate cancer, hypertension ,dyslipidemia, chronic thoracic and lumbar compression fractures who presented to [facility] ED 12/29/2013 with worsening back pain status post fall few days ago at home. Pain is in the mid and lower back, 10/10 in intensity not relieved with home analgesia  -Received consult d/t pt's hx of moderate malnutrition -Pt was very hard of hearing during time of assessment. Assisted in ordering lunch tray, but was unable to attain nutrition/food related hx -Was evaluated by RD during hospital admit in 09/2013. Wife had been present and reported decreased appetite and early satiety, only able to consume small amounts of 3 meals/day. Early satiety likely r/t aging process. Was drinking two Ensure/day and tolerated soft foods -Currently eating 25-50% of meals -Weight has significantly declined approximately 15-20 lbs in past 3 months, or 11-14% body weight loss  -Has hx of DM, however pt with minimal PO intake during meals. Would benefit from liberalized diet to encourage PO intake -Suspect malnutrition d/t declining weight and hx of poor PO from previous RD assessment   Height: Ht Readings from Last 1 Encounters:  12/29/13 5\' 6"  (1.676 m)    Weight: Wt Readings from Last 1  Encounters:  12/30/13 120 lb 9.5 oz (54.7 kg)    Ideal Body Weight: 142 lbs  % Ideal Body Weight: 85%  Wt Readings from Last 10 Encounters:  12/30/13 120 lb 9.5 oz (54.7 kg)  12/27/13 132 lb (59.875 kg)  11/12/13 137 lb 12.8 oz (62.506 kg)  10/07/13 142 lb (64.411 kg)  06/03/13 139 lb (63.05 kg)  04/16/13 136 lb 12.8 oz (62.052 kg)  04/13/13 136 lb 12.8 oz (62.052 kg)  04/09/13 138 lb 9.6 oz (62.869 kg)  04/05/13 138 lb 9.6 oz (62.869 kg)  04/01/13 138 lb 4.8 oz (62.732 kg)    Usual Body Weight: 146 lbs per previous RD assessment  % Usual Body Weight: 82%  BMI:  Body mass index is 19.47 kg/(m^2).  Estimated Nutritional Needs: Kcal: 1650-1850 Protein: 65-75 gram Fluid: >/=1700 ml/daily  Skin: WDL  Diet Order: General  EDUCATION NEEDS: -No education needs identified at this time   Intake/Output Summary (Last 24 hours) at 12/30/13 1111 Last data filed at 12/30/13 0900  Gross per 24 hour  Intake 866.67 ml  Output    300 ml  Net 566.67 ml    Last BM: 3/30   Labs:   Recent Labs Lab 12/27/13 2208 12/29/13 1120 12/30/13 0345  NA 135* 137 139  K 4.0 3.9 3.7  CL 97 99 103  CO2 24 23 26   BUN 24* 24* 25*  CREATININE 1.39* 1.43* 1.28  CALCIUM 10.2 9.5 8.8  GLUCOSE 159* 223* 141*    CBG (last 3)   Recent Labs  12/29/13 1716 12/29/13 2148 12/30/13 0737  GLUCAP 187* 208* 135*    Scheduled Meds: .  amLODipine  10 mg Oral Daily  . aspirin  81 mg Oral Daily  . atorvastatin  20 mg Oral QHS  . bicalutamide  50 mg Oral Daily  . calcium-vitamin D  1 tablet Oral BID  . Chlorhexidine Gluconate Cloth  6 each Topical Q0600  . docusate sodium  100 mg Oral Q12H  . fluticasone  1 spray Each Nare BID  . glimepiride  4 mg Oral QAC breakfast  . hydrOXYzine  25 mg Oral BID  . insulin aspart  0-15 Units Subcutaneous TID WC  . insulin aspart  0-5 Units Subcutaneous QHS  . insulin glargine  16 Units Subcutaneous QHS  . lipase/protease/amylase  4 capsule Oral TID  WC  . metoprolol  50 mg Oral Daily  . multivitamin with minerals  1 tablet Oral q morning - 10a  . mupirocin ointment  1 application Nasal BID  . oxybutynin  5 mg Oral QHS    Continuous Infusions:   Past Medical History  Diagnosis Date  . Hypertension   . Diabetes mellitus without complication   . Arthritis   . Sciatica   . GERD (gastroesophageal reflux disease)   . Urge incontinence   . Vertigo   . Hyperlipidemia   . ED (erectile dysfunction)   . Gastric polyposis   . Osteoporosis   . Decreased libido   . Depression   . Hepatitis A   . Stroke   . History of blood transfusion     1946  . OA (osteoarthritis)   . Bladder cancer   . Prostate cancer     S/P prostatectomy; Lung metastasis  . CAD (coronary artery disease) 1996    stent to the LAD   . Hyperlipidemia   . Type 2 diabetes mellitus     Past Surgical History  Procedure Laterality Date  . Carpal tunnel release    . Cataract extraction w/ intraocular lens  implant, bilateral  1998  . Prostatectomy    . Urinary sphincter implant    . Urinary sphincter implant revision    . Appendectomy    . Left hip surgery      Donated bone for bone graft to arm  . Penile prosthesis implant      S/P removal and re-implantation of new prosthesis  . Middle ear surgery    . Finger surgery      Left and right  . Hernia repair    . Bladder surgery    . Right arm bone graft      Pathological fracture  . Cardiac catheterization  11/27/2001    patent stent with mild in-stent restenosis.  . Myoview  06/12/2009    Persantine myoview EF 76%; Normal myoview  . Coronary angioplasty  1996    stenting to the LAD in [place].     Atlee Abide MS RD LDN Clinical Dietitian [phone]

## 2013-12-30 NOTE — Progress Notes (Signed)
Noticed that patient had no UOP from his foley catheter. Patient's room had a strong urine smell, assessed patient to see if he was wet/ foley was leaking and saw that foley bag had not been shut properly and that urine had drained all over the floor next to patient's bed and onto the patient safety mat. Cleaned patient's floor and safety mat and assured that foley bag now properly closed. Will continue to monitor patient.

## 2013-12-30 NOTE — Progress Notes (Signed)
Received notice from lab that patient's MRSA swab was positive. Patient was already on contact precautions d/t history of MRSA, positive MRSA PCR order set activated. Patient informed. Will continue to monitor.

## 2013-12-30 NOTE — Progress Notes (Addendum)
Patient ID: Guy [name], male   DOB: Apr 03, 1920, 80 y.o.   MRN: [id number] Request received for possible VP/KP in pt with hx back pain and noted compression fractures on recent CT. Pt has hx of L1 KP 10/12/13, L3 KP 11/23/12 by Dr. Estanislado Pandy. Recent imaging was reviewed by Dr. Estanislado Pandy and he recommends MRI T/L spine(T10-S1) prior to determining if pt is candidate for additional VP/KP procedure. We will await results and make further recommendations once finalized.

## 2013-12-30 NOTE — Progress Notes (Signed)
Nutrition Brief Note  Intervention: -Downgraded to Dys2 d/t pt's poor dentition/need for ground meats -Recommend Glucerna shakes once daily -Provided with "High Protein Foods" list to use upon d/c  -Followed up with patient,pt's wife available to provide more information regarding food/nutrition hx -Pt tolerates one Glucerna daily, is lactose-intolerant but is able to tolerated Glucerna shakes -Was receiving 2-3 daily, but decreased amount after pt's blood glucose were in the 300-400s. Has been followed by outpatient RD for diabetes management -Eating 3 small meals daily; wife has been trying to incorporate soft proteins at each meals -Has poor dentition, and is unable to tolerate tough meats. Pt's wife cuts proteins to almost ground consistency. Pt is in need of dental surgery; however primary care doctor advises against the need for sedation. Will downgrade to Dys 2 diet and assess tolerance -Provided pt's wife with list of "High Protein Foods" to incorporate into patient's diet as tolerated  Will continue to monitor. Please consult as needed [place] Clinical Dietitian [phone]

## 2013-12-30 NOTE — Progress Notes (Signed)
Note: This document was prepared with digital dictation and possible smart phrase technology. Any transcriptional errors that result from this process are unintentional.   Guy [name] LTJ:[id number] DOB: 11/01/1919 DOA: 12/29/2013 PCP: [name], MD  Brief narrative: 80 y/o ?, known prior h/o CAd s/p stent 1996, Last cath 2003 patent, known Htn, Hldm, NIDDM, Met prostate Ca [used to be on Lurpon-currently on Casodex]-followed at [place] by Dr. Marcello Moores, Prior vertebroplasties 10/12/13 L1 vertebra, prior MRSA pneumonia/Klebsialle Bacteremia + DVT 12/2012, Kyphoplasty 11/14/12 admitted from home with worsening back pain and mid lower back 10/10 intensity. He was recently seen 3/31 in the emergency room because of fall and weakness and was ultimately found to be unable to ambulate  Past medical history-As per Problem list Chart reviewed as below- Reviewed  Consultants:  Interventional radiology  Procedures:  Lumbar and sacral x-rays  Antibiotics:  None   Subjective  Very hard of hearing but he seems to be somewhat comfortable. Tablets morning Chest pain or shortness of breath Wife at bedside   Objective    Interim History: None  Telemetry: Sinus bradycardia on telemetry 30s to 40s   Objective: Filed Vitals:   12/29/13 2150 12/30/13 0438 12/30/13 0609 12/30/13 1057  BP: 168/69 182/66 159/62 162/69  Pulse: 59 59    Temp: 99.1 F (37.3 C) 98.2 F (36.8 C)    TempSrc: Oral Oral    Resp: 18 18    Height:      Weight:  54.7 kg (120 lb 9.5 oz)    SpO2: 92% 94%      Intake/Output Summary (Last 24 hours) at 12/30/13 1202 Last data filed at 12/30/13 0900  Gross per 24 hour  Intake 866.67 ml  Output    300 ml  Net 566.67 ml    Exam:  General: Alert pleasant oriented in no apparent distress Cardiovascular: S1-S2 bradycardic Respiratory: Clinically clear Abdomen: No tenderness no rebound Skin no lower extremity edema Neuro able to lift  both feet off the bed  Data Reviewed: Basic Metabolic Panel:  Recent Labs Lab 12/27/13 2208 12/29/13 1120 12/30/13 0345  NA 135* 137 139  K 4.0 3.9 3.7  CL 97 99 103  CO2 24 23 26   GLUCOSE 159* 223* 141*  BUN 24* 24* 25*  CREATININE 1.39* 1.43* 1.28  CALCIUM 10.2 9.5 8.8   Liver Function Tests:  Recent Labs Lab 12/30/13 0345  AST 120*  ALT 64*  ALKPHOS 343*  BILITOT 1.6*  PROT 6.9  ALBUMIN 2.7*   No results found for this basename: LIPASE, AMYLASE,  in the last 168 hours No results found for this basename: AMMONIA,  in the last 168 hours CBC:  Recent Labs Lab 12/27/13 2208 12/29/13 1120 12/30/13 0345  WBC 11.8* 9.2 8.1  NEUTROABS 9.9* 6.9  --   HGB 13.9 13.1 12.6*  HCT 41.1 38.8* 37.9*  MCV 95.6 95.8 96.9  PLT 132* 110* 123*   Cardiac Enzymes:  Recent Labs Lab 12/30/13 1057  TROPONINI 0.32*   BNP: No components found with this basename: POCBNP,  CBG:  Recent Labs Lab 12/29/13 1716 12/29/13 2148 12/30/13 0737 12/30/13 1148  GLUCAP 187* 208* 135* 201*    Recent Results (from the past 240 hour(s))  URINE CULTURE     Status: None   Collection Time    12/27/13 10:21 PM      Result Value Ref Range Status   Specimen Description URINE, RANDOM   Final   Special Requests  NONE   Final   Culture  Setup Time     Final   Value: 12/28/2013 05:58     Performed at [place]     Final   Value: >=100,000 COLONIES/ML     Performed at [facility]   Culture     Final   Value: PROTEUS MIRABILIS     Performed at [facility]   Report Status 12/30/2013 FINAL   Final   Organism ID, Bacteria PROTEUS MIRABILIS   Final  MRSA PCR SCREENING     Status: Abnormal   Collection Time    12/29/13 10:23 PM      Result Value Ref Range Status   MRSA by PCR POSITIVE (*) NEGATIVE Final   Comment:            The GeneXpert MRSA Assay (FDA     approved for NASAL specimens     only), is one component of a     comprehensive  MRSA colonization     surveillance program. It is not     intended to diagnose MRSA     infection nor to guide or     monitor treatment for     MRSA infections.     RESULT CALLED TO, READ BACK BY AND VERIFIED WITH:     TINER,A/4W @2340  ON 12/29/13 BY KARCZEWSKI,S.     Studies:              All Imaging reviewed and is as per above notation   Scheduled Meds: . amLODipine  10 mg Oral Daily  . aspirin  81 mg Oral Daily  . atorvastatin  20 mg Oral QHS  . bicalutamide  50 mg Oral Daily  . calcium-vitamin D  1 tablet Oral BID  . Chlorhexidine Gluconate Cloth  6 each Topical Q0600  . docusate sodium  100 mg Oral Q12H  . feeding supplement (RESOURCE BREEZE)  1 Container Oral BID BM  . fluticasone  1 spray Each Nare BID  . glimepiride  4 mg Oral QAC breakfast  . hydrOXYzine  25 mg Oral BID  . insulin aspart  0-15 Units Subcutaneous TID WC  . insulin aspart  0-5 Units Subcutaneous QHS  . insulin glargine  16 Units Subcutaneous QHS  . lipase/protease/amylase  4 capsule Oral TID WC  . metoprolol  12.5 mg Oral Daily  . multivitamin with minerals  1 tablet Oral q morning - 10a  . mupirocin ointment  1 application Nasal BID  . oxybutynin  5 mg Oral QHS   Continuous Infusions:    Assessment/Plan: 1. Back pain with thoracic and lumbar compression fractures-interventional radiology consult to see if kyphoplasty can be performed. For now continue hydrocodone 1-2 tabs every 4 when necessary 5/325, morphine 2 mg every 4 when necessary. 2. Elevated troponin-medical management as per cardiology. Troponin trending down 0.32.  Aspirin 81 mg daily-no further work-up at this stage 3. Type 2 diabetes mellitus-continue Lantus 16 units each bedtime sliding-scale coverage discontinue Amaryl while in hospital. 4. Hypertension continue metoprolol at low dose 12.5 daily as sinus bradycardia, continue with amlodipine 10 mg and may need to downward titrate this 5. Thrombocytopenia, prior DVT-thrombocytopenia  potentially secondary to medication vs. cancer currently not on on Coumadin continue aspirin 81 mg daily 6. Metastatic prostate cancer diag 1982, Rx [facility] with spinal and rib metastases-continue Casodex 50 mg daily used to be on Lupron till 20013. Outpatient PET scan should be  performed at some point-might need re-satging given all of these fractures which might be malignant 7. History of urinary incontinence is secondary to artificial sphincter placement followed by Dr. Dalbert Garnet need replacement of Foley catheter as is overdue for this 8. Volume depletion-discontinued IV saline 50 cc per hour, force fluids 9. Moderate protein energy malnutrition, will obtain nutrition consult 10. Elevated LFTs-probably secondary to bony metastases or from Casodex-the discussion as an outpatient regarding this we'll repeat them in the morning 11. Hyperlipidemia-continue atorvastatin 20 daily 12. Constipation continue Colace  Code Status: Full Family Communication:  Discussed briefly with wife at bedside Disposition Plan: Inpatient   Verneita Griffes, MD  [facility] Pager [phone] 12/30/2013, 12:02 PM    LOS: 1 day

## 2013-12-31 ENCOUNTER — Inpatient Hospital Stay (HOSPITAL_COMMUNITY): Payer: Medicare Other

## 2013-12-31 ENCOUNTER — Encounter (HOSPITAL_COMMUNITY): Payer: Self-pay

## 2013-12-31 ENCOUNTER — Encounter (HOSPITAL_COMMUNITY): Payer: Medicare Other

## 2013-12-31 DIAGNOSIS — M8448XA Pathological fracture, other site, initial encounter for fracture: Secondary | ICD-10-CM | POA: Diagnosis not present

## 2013-12-31 LAB — CBC
HEMATOCRIT: 40.4 % (ref 39.0–52.0)
Hemoglobin: 13.6 g/dL (ref 13.0–17.0)
MCH: 32.3 pg (ref 26.0–34.0)
MCHC: 33.7 g/dL (ref 30.0–36.0)
MCV: 96 fL (ref 78.0–100.0)
PLATELETS: 116 10*3/uL — AB (ref 150–400)
RBC: 4.21 MIL/uL — ABNORMAL LOW (ref 4.22–5.81)
RDW: 13.4 % (ref 11.5–15.5)
WBC: 9.3 10*3/uL (ref 4.0–10.5)

## 2013-12-31 LAB — GLUCOSE, CAPILLARY
GLUCOSE-CAPILLARY: 129 mg/dL — AB (ref 70–99)
Glucose-Capillary: 170 mg/dL — ABNORMAL HIGH (ref 70–99)
Glucose-Capillary: 190 mg/dL — ABNORMAL HIGH (ref 70–99)
Glucose-Capillary: 231 mg/dL — ABNORMAL HIGH (ref 70–99)

## 2013-12-31 LAB — PROTIME-INR
INR: 1.26 (ref 0.00–1.49)
Prothrombin Time: 15.5 seconds — ABNORMAL HIGH (ref 11.6–15.2)

## 2013-12-31 LAB — COMPREHENSIVE METABOLIC PANEL
ALT: 59 U/L — ABNORMAL HIGH (ref 0–53)
AST: 89 U/L — AB (ref 0–37)
Albumin: 2.8 g/dL — ABNORMAL LOW (ref 3.5–5.2)
Alkaline Phosphatase: 334 U/L — ABNORMAL HIGH (ref 39–117)
BILIRUBIN TOTAL: 0.9 mg/dL (ref 0.3–1.2)
BUN: 23 mg/dL (ref 6–23)
CALCIUM: 9 mg/dL (ref 8.4–10.5)
CHLORIDE: 102 meq/L (ref 96–112)
CO2: 23 mEq/L (ref 19–32)
Creatinine, Ser: 1.14 mg/dL (ref 0.50–1.35)
GFR calc Af Amer: 63 mL/min — ABNORMAL LOW (ref >90)
GFR, EST NON AFRICAN AMERICAN: 54 mL/min — AB (ref >90)
Glucose, Bld: 146 mg/dL — ABNORMAL HIGH (ref 70–99)
Potassium: 3.7 mEq/L (ref 3.7–5.3)
Sodium: 137 mEq/L (ref 137–147)
Total Protein: 7.5 g/dL (ref 6.0–8.3)

## 2013-12-31 MED ORDER — TECHNETIUM TC 99M MEDRONATE IV KIT
25.5000 | PACK | Freq: Once | INTRAVENOUS | Status: AC | PRN
  Administered 2013-12-31: 25.5 via INTRAVENOUS

## 2013-12-31 MED ORDER — POLYETHYLENE GLYCOL 3350 17 G PO PACK
17.0000 g | PACK | Freq: Every day | ORAL | Status: DC | PRN
  Administered 2013-12-31 – 2014-01-03 (×2): 17 g via ORAL
  Filled 2013-12-31 (×2): qty 1

## 2013-12-31 NOTE — Progress Notes (Signed)
Patient ID: Guy [name], male   DOB: 04-03-1920, 80 y.o.   MRN: [id number] Pt is a 80 year old male with history of prostate/bladder cancer, hypertension ,dyslipidemia, chronic thoracic and lumbar compression fractures who presented to [facility] ED 12/29/2013 with worsening back pain status post fall few days ago at home. MRI lower thoracic/lumbar spine revealed no acute fractures. Bone scan reveals findings c/w sacral insuff fractures. Pt has had prior L1/L3 KP's 10/12/13 and 11/23/12 respectively. Pt is moderately tender in sacral region and states pain is better only when he is completely still; otherwise any movement causes back/sacral pain. Imaging studies have been reviewed by Dr. Estanislado Pandy and he feels pt is candidate for sacroplasty. Additional PMH as below. Exam: pt awake,HOH, answers questions appropriately; chest- SL dim BS bases; heart- bradycardic but regular; abd- soft,+BS,NT; ext- no edema; sens/strength intact UE/LE; mod paravertebral /sacral tenderness top palpation.    Filed Vitals:   12/30/13 2059 12/31/13 0500 12/31/13 0618 12/31/13 1444  BP:   151/70 157/70  Pulse:   67 68  Temp: 99.1 F (37.3 C)  98 F (36.7 C) 98.4 F (36.9 C)  TempSrc:   Oral Oral  Resp:   18 20  Height:      Weight:  127 lb 10.3 oz (57.9 kg)    SpO2:   98% 97%   Past Medical History  Diagnosis Date  . Hypertension   . Diabetes mellitus without complication   . Arthritis   . Sciatica   . GERD (gastroesophageal reflux disease)   . Urge incontinence   . Vertigo   . Hyperlipidemia   . ED (erectile dysfunction)   . Gastric polyposis   . Osteoporosis   . Decreased libido   . Depression   . Hepatitis A   . Stroke   . History of blood transfusion     1946  . OA (osteoarthritis)   . Bladder cancer   . Prostate cancer     S/P prostatectomy; Lung metastasis  . CAD (coronary artery disease) 1996    stent to the LAD   . Hyperlipidemia   . Type 2 diabetes mellitus    Past Surgical History  Procedure  Laterality Date  . Carpal tunnel release    . Cataract extraction w/ intraocular lens  implant, bilateral  1998  . Prostatectomy    . Urinary sphincter implant    . Urinary sphincter implant revision    . Appendectomy    . Left hip surgery      Donated bone for bone graft to arm  . Penile prosthesis implant      S/P removal and re-implantation of new prosthesis  . Middle ear surgery    . Finger surgery      Left and right  . Hernia repair    . Bladder surgery    . Right arm bone graft      Pathological fracture  . Cardiac catheterization  11/27/2001    patent stent with mild in-stent restenosis.  . Myoview  06/12/2009    Persantine myoview EF 76%; Normal myoview  . Coronary angioplasty  1996    stenting to the LAD in [place].    Dg Chest 2 View  12/29/2013   CLINICAL DATA:  80 year old male shortness of breath and back pain. Initial encounter.  EXAM: CHEST  2 VIEW  COMPARISON:  12/27/2013 and earlier.  FINDINGS: Stable lung volumes. Stable cardiac size and mediastinal contours. Small to moderate bilateral pleural effusions, dependent.  No pneumothorax. No pulmonary edema. No superimposed confluent pulmonary opacity. Osteopenia. Spinal compression fractures, some previously augmented. Chronic proximal right humerus deformity. Subacute to chronic left lateral rib fractures.  IMPRESSION: Small to moderate layering pleural effusions. No other acute cardiopulmonary abnormality identified.   Electronically Signed   By: Lars Pinks M.D.   On: 12/29/2013 11:38   Dg Chest 2 View  12/27/2013   CLINICAL DATA:  Fall, right-sided chest pain  EXAM: CHEST  2 VIEW  COMPARISON:  Prior chest x-ray and right rib series 1 09/2013  FINDINGS: Low inspiratory volumes. Nodular opacities in the left mid lung are again noted. Stable cardiac and mediastinal contours. Background bronchitic changes and interstitial prominence grossly similar compared to prior. No pneumothorax or large effusion. Pulmonary vascular  congestion without overt edema. No definite acute fracture. Remote healed right proximal humeral fracture. Osteoarthritis of the bilateral acromioclavicular joints. No acute osseous abnormality. The bones are diffusely osteopenic.  IMPRESSION: Low inspiratory volumes with bibasilar atelectasis and mild vascular congestion.  Otherwise, stable chest x-ray.   Electronically Signed   By: Jacqulynn Cadet M.D.   On: 12/27/2013 23:20   Dg Thoracic Spine 2 View  12/30/2013   CLINICAL DATA:  Vertebral body fractures.  Back pain.  EXAM: THORACIC SPINE - 2 VIEW  COMPARISON:  CT 12/27/2013.  FINDINGS: Diffuse degenerative changes and osteopenia with multiple thoracic vertebral bodies stable compression fractures noted. Previously questioned slight increase T11 and T12 compression fracture appears stable on thoracic spine series.  IMPRESSION: Diffuse osteopenia and degenerative change with stable thoracic spine compression fractures. Previously questioned T11 and T12 mild increase compression on lumbar spine series obtained today appears stable on the thoracic spine series. The mild compression may have been accentuated by the projection on the lumbar spine series.   Electronically Signed   By: Marcello Moores  Register   On: 12/30/2013 12:02   Dg Lumbar Spine 2-3 Views  12/30/2013   CLINICAL DATA:  Vertebral fractures.  EXAM: LUMBAR SPINE - 2-3 VIEW  COMPARISON:  CT L SPINE W/O CM dated 12/27/2013  FINDINGS: Diffuse severe lumbar spine degenerative change and osteopenia noted. Patient has had prior vertebroplasties L1 and L3. Mild compression at L4 is noted, this has progressed slightly from prior exam. Mild compression at T11 and T12 have progressed slightly from prior study. Prominent scoliosis concave left unchanged . Surgical clips within the right upper quadrant and pelvis.  IMPRESSION: 1. Mild increase compression of T11, T12 and L4. 2. Diffuse degenerative change with severe osteopenia and with multiple compressions again  noted. Prior vertebroplasty L1 and L3.   Electronically Signed   By: Marcello Moores  Register   On: 12/30/2013 11:02   Dg Hip Complete Right  12/27/2013   CLINICAL DATA:  Fall, right hip pain  EXAM: RIGHT HIP - COMPLETE 2+ VIEW  COMPARISON:  Prior radiographs of the pelvis and right hip 06/24/2013  FINDINGS: The bones are diffusely osteopenic. This limits evaluation for nondisplaced fractures considerably. No definite fracture identified. Prior lower lumbar compression fractures status post vertebroplasty. Numerous surgical clips project over the anatomic pelvis. Scattered atherosclerotic vascular calcifications. Calcifications of the bilateral vas deferens noted incidentally. Osseous excrescence arising from the lateral aspect of the left ilium a similar compared to prior.  IMPRESSION: No acute fracture identified although marked background osteopenia limits evaluation for nondisplaced fractures.   Electronically Signed   By: Jacqulynn Cadet M.D.   On: 12/27/2013 23:17   Ct Head Wo Contrast  12/27/2013   CLINICAL  DATA:  Fall, headache and neck pain, back pain.  EXAM: CT HEAD WITHOUT CONTRAST  CT CERVICAL SPINE WITHOUT CONTRAST  TECHNIQUE: Multidetector CT imaging of the head and cervical spine was performed following the standard protocol without intravenous contrast. Multiplanar CT image reconstructions of the cervical spine were also generated.  COMPARISON:  CT HEAD W/O CM dated 03/21/2013; DG CERVICAL SPINE 2-3 VIEWS dated 10/07/2013  FINDINGS: CT HEAD FINDINGS  The ventricles and sulci are normal for age. No intraparenchymal hemorrhage, mass effect nor midline shift. Confluent supratentorial white matter hypodensities are within normal range for patient's age and though non-specific suggest sequelae of chronic small vessel ischemic disease. No acute large vascular territory infarcts. Multiple tiny T2 hyperintensities within the basal ganglia and thalamus likely reflect remote lacunar infarcts.  No abnormal  extra-axial fluid collections. Basal cisterns are patent. Moderate calcific atherosclerosis of the carotid siphons and included vertebral arteries.  No skull fracture. Status post bilateral ocular lens implants. Right maxillary mucosal thickening and bony wall changes consistent chronic sinusitis. Mild ethmoid mucosal thickening. Mastoid air cells are well aerated. Severe bilateral temporomandibular osteoarthrosis. Status post bilateral ocular lens implants.  CT CERVICAL SPINE FINDINGS  Cervical vertebral bodies and posterior elements appear intact with accentuated cervical lordosis. The C3-4 facets are fused on degenerative basis. C1-2 articulation maintained with severe arthropathy. Calcified pannus about the odontoid process likely reflects CPPD. Severe C3-4 thru C5-6 degenerative disc disease. Bone mineral density is decreased without destructive bony lesions. Severe calcific atherosclerosis of the left carotid bulb, if clinical concern for hemodynamically significant stenosis, carotid ultrasound or CT angiogram would be more sensitive. Included view of the lung apices demonstrate calcified granulomas.  Degenerative disc disease and facet arthropathy result in mild canal stenosis C4-5 and C5-6. Moderate C4-5 and C5-6 neural foraminal narrowing.  IMPRESSION: CT head:  No acute intracranial process.  Involutional changes. Moderate to severe white matter changes suggest chronic small vessel ischemic disease with bilateral suspected basal ganglia and thalamus remote lacunar infarcts.  CT cervical spine: Accentuated cervical lordosis without acute fracture nor malalignment.   Electronically Signed   By: Elon Alas   On: 12/27/2013 23:33   Ct Cervical Spine Wo Contrast  12/27/2013   CLINICAL DATA:  Fall, headache and neck pain, back pain.  EXAM: CT HEAD WITHOUT CONTRAST  CT CERVICAL SPINE WITHOUT CONTRAST  TECHNIQUE: Multidetector CT imaging of the head and cervical spine was performed following the standard  protocol without intravenous contrast. Multiplanar CT image reconstructions of the cervical spine were also generated.  COMPARISON:  CT HEAD W/O CM dated 03/21/2013; DG CERVICAL SPINE 2-3 VIEWS dated 10/07/2013  FINDINGS: CT HEAD FINDINGS  The ventricles and sulci are normal for age. No intraparenchymal hemorrhage, mass effect nor midline shift. Confluent supratentorial white matter hypodensities are within normal range for patient's age and though non-specific suggest sequelae of chronic small vessel ischemic disease. No acute large vascular territory infarcts. Multiple tiny T2 hyperintensities within the basal ganglia and thalamus likely reflect remote lacunar infarcts.  No abnormal extra-axial fluid collections. Basal cisterns are patent. Moderate calcific atherosclerosis of the carotid siphons and included vertebral arteries.  No skull fracture. Status post bilateral ocular lens implants. Right maxillary mucosal thickening and bony wall changes consistent chronic sinusitis. Mild ethmoid mucosal thickening. Mastoid air cells are well aerated. Severe bilateral temporomandibular osteoarthrosis. Status post bilateral ocular lens implants.  CT CERVICAL SPINE FINDINGS  Cervical vertebral bodies and posterior elements appear intact with accentuated cervical lordosis. The C3-4 facets  are fused on degenerative basis. C1-2 articulation maintained with severe arthropathy. Calcified pannus about the odontoid process likely reflects CPPD. Severe C3-4 thru C5-6 degenerative disc disease. Bone mineral density is decreased without destructive bony lesions. Severe calcific atherosclerosis of the left carotid bulb, if clinical concern for hemodynamically significant stenosis, carotid ultrasound or CT angiogram would be more sensitive. Included view of the lung apices demonstrate calcified granulomas.  Degenerative disc disease and facet arthropathy result in mild canal stenosis C4-5 and C5-6. Moderate C4-5 and C5-6 neural foraminal  narrowing.  IMPRESSION: CT head:  No acute intracranial process.  Involutional changes. Moderate to severe white matter changes suggest chronic small vessel ischemic disease with bilateral suspected basal ganglia and thalamus remote lacunar infarcts.  CT cervical spine: Accentuated cervical lordosis without acute fracture nor malalignment.   Electronically Signed   By: Elon Alas   On: 12/27/2013 23:33   Ct Thoracic Spine Wo Contrast  12/27/2013   CLINICAL DATA:  Fall, back pain.  Status post L1 kyphoplasty.  EXAM: CT THORACIC AND LUMBAR SPINE WITHOUT CONTRAST  TECHNIQUE: Multidetector CT imaging of the thoracic and lumbar spine was performed without contrast. Multiplanar CT image reconstructions were also generated.  COMPARISON:  DG CHEST 2 VIEW dated 12/27/2013; IR KYPHO VERTEBRAL LUMBAR AUGMENTATION dated 10/12/2013; CT L SPINE W/O CM dated 10/07/2013  FINDINGS: CT THORACIC SPINE FINDINGS  Severe at T2 and T3 burst fractures, which were present on prior chest radiographs, with T2 vertebra plana ; at least 3 mm of retropulsed bony fragments at T2 and T3 without canal stenosis. At least 75% T3 vertebral body height loss. Very mild T4 compression fracture, mild T8 compression fracture with less than 25% height loss. Very mild T12 suspected compression fracture. No malalignment. Accentuated upper thoracic kyphosis.  Severe osteopenia. No destructive bony lesions. Intervertebral disc heights generally preserved with upper thoracic ventral endplate spurring. Apical calcified granulomas. Included prevertebral and paraspinal soft tissues are nonsuspicious.  CT LUMBAR SPINE FINDINGS  Transitional anatomy with sacralized L5 vertebral body. Severe osteopenia. Moderate to severe L1 burst fracture status post vertebral body cement augmentation. Healing right L1 transverse process fracture. Moderate L3 vertebral body fracture and cement augmentation. Right L3 nondisplaced transverse process fracture appears new from  prior imaging. Grade 1 L4-5 anterolisthesis on degenerative basis. Lumbar lordosis maintained. Severe osteopenia without destructive bony lesions.  Moderate calcific atherosclerosis of the aorta. Moderate symmetric paraspinal muscle atrophy. Retroperitoneal surgical clips. Status postcholecystectomy. Right upper pole 3.4 cm renal cyst.  Degenerative disc disease and facet arthropathy result in mild canal stenosis L2-3, moderate at L3-4 and moderate to severe L4-5. Moderate to severe right L3-4, moderate right L4-5 neural foraminal narrowing. Severe left L1-2, L3-4 and moderate left L4-5 neural foraminal narrowing.  IMPRESSION: CT THORACIC SPINE IMPRESSION  Severe osteopenia. Multiple thoracic compression fractures including severe T2 and T3 burst fractures which appear chronic, without canal stenosis, or malalignment. Accentuated upper thoracic kyphosis.  CT LUMBAR SPINE IMPRESSION  Severe osteopenia. Nondisplaced right L3 transverse process fracture may be acute.  Remote L1 and L3 compression fractures, status post vertebroplasty/ cement augmentation at these levels. Grade 1 L4-5 anterolisthesis on degenerative basis.   Electronically Signed   By: Elon Alas   On: 12/27/2013 23:53   Ct Lumbar Spine Wo Contrast  12/27/2013   CLINICAL DATA:  Fall, back pain.  Status post L1 kyphoplasty.  EXAM: CT THORACIC AND LUMBAR SPINE WITHOUT CONTRAST  TECHNIQUE: Multidetector CT imaging of the thoracic and lumbar spine was performed  without contrast. Multiplanar CT image reconstructions were also generated.  COMPARISON:  No comparisons  FINDINGS: CT THORACIC SPINE FINDINGS  Severe at T2 and T3 burst fractures, which were present on prior chest radiographs, with T2 vertebra plana ; at least 3 mm of retropulsed bony fragments at T2 and T3 without canal stenosis. At least 75% T3 vertebral body height loss. Very mild T4 compression fracture, mild T8 compression fracture with less than 25% height loss. Very mild T12  suspected compression fracture. No malalignment. Accentuated upper thoracic kyphosis.  Severe osteopenia. No destructive bony lesions. Intervertebral disc heights generally preserved with upper thoracic ventral endplate spurring. Apical calcified granulomas. Included prevertebral and paraspinal soft tissues are nonsuspicious.  CT LUMBAR SPINE FINDINGS  Transitional anatomy with sacralized L5 vertebral body. Severe osteopenia. Moderate to severe L1 burst fracture status post vertebral body cement augmentation. Healing right L1 transverse process fracture. Moderate L3 vertebral body fracture and cement augmentation. Right L3 nondisplaced transverse process fracture appears new from prior imaging. Grade 1 L4-5 anterolisthesis on degenerative basis. Lumbar lordosis maintained. Severe osteopenia without destructive bony lesions.  Moderate calcific atherosclerosis of the aorta. Moderate symmetric paraspinal muscle atrophy. Retroperitoneal surgical clips. Status postcholecystectomy. Right upper pole 3.4 cm renal cyst.  Degenerative disc disease and facet arthropathy result in mild canal stenosis L2-3, moderate at L3-4 and moderate to severe L4-5. Moderate to severe right L3-4, moderate right L4-5 neural foraminal narrowing. Severe left L1-2, L3-4 and moderate left L4-5 neural foraminal narrowing.  IMPRESSION: CT THORACIC SPINE IMPRESSION  Severe osteopenia. Multiple thoracic compression fractures including severe T2 and T3 burst fractures which appear chronic, without canal stenosis, or malalignment. Accentuated upper thoracic kyphosis.  CT LUMBAR SPINE IMPRESSION  Severe osteopenia. Nondisplaced right L3 transverse process fracture may be acute.  Remote L1 and L3 compression fractures, status post vertebroplasty/ cement augmentation at these levels. Grade 1 L4-5 anterolisthesis on degenerative basis.   Electronically Signed   By: Elon Alas   On: 12/27/2013 23:51   Mr Lumbar Spine Wo Contrast  12/30/2013    CLINICAL DATA:  Worsening back pain. Prior L1 and L3 vertebra rolled compression fracture status post kyphoplasty.  EXAM: MRI LUMBAR SPINE WITHOUT CONTRAST  TECHNIQUE: Multiplanar, multisequence MR imaging was performed. No intravenous contrast was administered.  COMPARISON:  Lumbar spine radiographs 12/30/2013. Lumbar and thoracic CT 12/27/2013. Lumbar spine MRI 11/19/2012.  FINDINGS: Again noted is transitional lumbosacral anatomy with partial sacralization of L5.  Again seen are moderate L1 and L3 compression fracture status post cement augmentation. There is minimal depression of the superior T12 endplate without frank compression fracture, and there is no marrow edema at this level. Thoracolumbar dextroscoliosis is noted. There is slight anterolisthesis of L4 on L5, unchanged. The conus medullaris is normal in signal and terminates at T12-L1. Multiple renal cysts are partially visualized bilaterally. There are small bilateral pleural effusions.  T9-10 through T11-12: No disc herniation or stenosis.  T12-L1: Minimal disc bulging and slight retropulsion of the L1 superior endplate. No spinal stenosis. Minimal left neural foraminal narrowing.  L1-2: Mild disc bulging and facet and ligamentum flavum hypertrophy result in mild to moderate left neural foraminal stenosis. No spinal canal stenosis.  L2-3: Minimal disc bulge and mild facet and ligamentum flavum hypertrophy result in mild left neural foraminal narrowing. No spinal canal stenosis.  L3-4: Disc bulge and moderate facet and ligamentum flavum hypertrophy result in moderate bilateral lateral recess stenosis and moderate right and severe left neural foraminal stenosis, unchanged from prior MRI.  L4-5: Disc bulge and advanced facet and ligamentum flavum hypertrophy result in mild spinal stenosis, right greater than left lateral recess stenosis, and moderate to severe right and mild left neural foraminal stenosis, unchanged.  L5-S1:  Negative.  IMPRESSION: 1.  Unchanged L1 and L3 compression fractures status post cement augmentation. No acute compression fracture. 2. Scoliosis with multilevel degenerative disc disease and facet arthrosis. Stenosis is greatest at L3-4 and L4-5 as above without significant interval change from prior MRI.   Electronically Signed   By: Logan Bores   On: 12/30/2013 18:45   Nm Bone Scan Whole Body  12/31/2013   CLINICAL DATA:  Metastatic prostate cancer to the lung. Back pain and sacral pain.  EXAM: NUCLEAR MEDICINE WHOLE BODY BONE SCAN  TECHNIQUE: Whole body anterior and posterior images were obtained approximately 3 hours after intravenous injection of radiopharmaceutical.  RADIOPHARMACEUTICALS:  25.5 mCi of Technetium-99 MDP  COMPARISON:  Bone scans dated 11/17/2012 and 05/19/2008 and CT scans of the spine dated 12/27/2013  FINDINGS: There is increased activity in the upper thoracic spine and in the upper lumbar spine as well as several left posterior ribs. All of these areas of activity correlate with benign appearing fractures in the spine and in the left ribs. None appear acute.  There is an area of vertically increased activity in the right side of the sacrum consistent with an insufficiency fracture.  IMPRESSION: Multiple areas of abnormal activity in the spine and left ribs and in the sacrum which are new since the prior study. These these are all felt to represent benign fractures. The spine and rib fractures appear to be relatively old.   Electronically Signed   By: Rozetta Nunnery M.D.   On: 12/31/2013 14:43  Results for orders placed during the hospital encounter of 12/29/13  MRSA PCR SCREENING      Result Value Ref Range   MRSA by PCR POSITIVE (*) NEGATIVE  CBC WITH DIFFERENTIAL      Result Value Ref Range   WBC 9.2  4.0 - 10.5 K/uL   RBC 4.05 (*) 4.22 - 5.81 MIL/uL   Hemoglobin 13.1  13.0 - 17.0 g/dL   HCT 38.8 (*) 39.0 - 52.0 %   MCV 95.8  78.0 - 100.0 fL   MCH 32.3  26.0 - 34.0 pg   MCHC 33.8  30.0 - 36.0 g/dL    RDW 13.1  11.5 - 15.5 %   Platelets 110 (*) 150 - 400 K/uL   Neutrophils Relative % 76  43 - 77 %   Neutro Abs 6.9  1.7 - 7.7 K/uL   Lymphocytes Relative 9 (*) 12 - 46 %   Lymphs Abs 0.8  0.7 - 4.0 K/uL   Monocytes Relative 11  3 - 12 %   Monocytes Absolute 1.0  0.1 - 1.0 K/uL   Eosinophils Relative 4  0 - 5 %   Eosinophils Absolute 0.4  0.0 - 0.7 K/uL   Basophils Relative 0  0 - 1 %   Basophils Absolute 0.0  0.0 - 0.1 K/uL  BASIC METABOLIC PANEL      Result Value Ref Range   Sodium 137  137 - 147 mEq/L   Potassium 3.9  3.7 - 5.3 mEq/L   Chloride 99  96 - 112 mEq/L   CO2 23  19 - 32 mEq/L   Glucose, Bld 223 (*) 70 - 99 mg/dL   BUN 24 (*) 6 - 23 mg/dL   Creatinine, Ser 1.43 (*)  0.50 - 1.35 mg/dL   Calcium 9.5  8.4 - 10.5 mg/dL   GFR calc non Af Amer 41 (*) >90 mL/min   GFR calc Af Amer 48 (*) >90 mL/min  URINALYSIS, ROUTINE W REFLEX MICROSCOPIC      Result Value Ref Range   Color, Urine YELLOW  YELLOW   APPearance TURBID (*) CLEAR   Specific Gravity, Urine 1.021  1.005 - 1.030   pH 6.0  5.0 - 8.0   Glucose, UA NEGATIVE  NEGATIVE mg/dL   Hgb urine dipstick MODERATE (*) NEGATIVE   Bilirubin Urine NEGATIVE  NEGATIVE   Ketones, ur NEGATIVE  NEGATIVE mg/dL   Protein, ur 100 (*) NEGATIVE mg/dL   Urobilinogen, UA 0.2  0.0 - 1.0 mg/dL   Nitrite NEGATIVE  NEGATIVE   Leukocytes, UA SMALL (*) NEGATIVE  PRO B NATRIURETIC PEPTIDE      Result Value Ref Range   Pro B Natriuretic peptide (BNP) 2026.0 (*) 0 - 450 pg/mL  URINE MICROSCOPIC-ADD ON      Result Value Ref Range   WBC, UA 11-20  <3 WBC/hpf   RBC / HPF 7-10  <3 RBC/hpf   Bacteria, UA MANY (*) RARE   Casts HYALINE CASTS (*) NEGATIVE   Urine-Other MUCOUS PRESENT    COMPREHENSIVE METABOLIC PANEL      Result Value Ref Range   Sodium 139  137 - 147 mEq/L   Potassium 3.7  3.7 - 5.3 mEq/L   Chloride 103  96 - 112 mEq/L   CO2 26  19 - 32 mEq/L   Glucose, Bld 141 (*) 70 - 99 mg/dL   BUN 25 (*) 6 - 23 mg/dL   Creatinine, Ser 1.28   0.50 - 1.35 mg/dL   Calcium 8.8  8.4 - 10.5 mg/dL   Total Protein 6.9  6.0 - 8.3 g/dL   Albumin 2.7 (*) 3.5 - 5.2 g/dL   AST 120 (*) 0 - 37 U/L   ALT 64 (*) 0 - 53 U/L   Alkaline Phosphatase 343 (*) 39 - 117 U/L   Total Bilirubin 1.6 (*) 0.3 - 1.2 mg/dL   GFR calc non Af Amer 47 (*) >90 mL/min   GFR calc Af Amer 55 (*) >90 mL/min  CBC      Result Value Ref Range   WBC 8.1  4.0 - 10.5 K/uL   RBC 3.91 (*) 4.22 - 5.81 MIL/uL   Hemoglobin 12.6 (*) 13.0 - 17.0 g/dL   HCT 37.9 (*) 39.0 - 52.0 %   MCV 96.9  78.0 - 100.0 fL   MCH 32.2  26.0 - 34.0 pg   MCHC 33.2  30.0 - 36.0 g/dL   RDW 13.5  11.5 - 15.5 %   Platelets 123 (*) 150 - 400 K/uL  GLUCOSE, CAPILLARY      Result Value Ref Range   Glucose-Capillary 187 (*) 70 - 99 mg/dL   Comment 1 Documented in Chart    GLUCOSE, CAPILLARY      Result Value Ref Range   Glucose-Capillary 208 (*) 70 - 99 mg/dL   Comment 1 Documented in Chart     Comment 2 Notify RN    GLUCOSE, CAPILLARY      Result Value Ref Range   Glucose-Capillary 135 (*) 70 - 99 mg/dL  TROPONIN I      Result Value Ref Range   Troponin I 0.32 (*) <0.30 ng/mL  TROPONIN I      Result Value Ref Range  Troponin I <0.30  <0.30 ng/mL  TROPONIN I      Result Value Ref Range   Troponin I <0.30  <0.30 ng/mL  GLUCOSE, CAPILLARY      Result Value Ref Range   Glucose-Capillary 201 (*) 70 - 99 mg/dL  COMPREHENSIVE METABOLIC PANEL      Result Value Ref Range   Sodium 137  137 - 147 mEq/L   Potassium 3.7  3.7 - 5.3 mEq/L   Chloride 102  96 - 112 mEq/L   CO2 23  19 - 32 mEq/L   Glucose, Bld 146 (*) 70 - 99 mg/dL   BUN 23  6 - 23 mg/dL   Creatinine, Ser 1.14  0.50 - 1.35 mg/dL   Calcium 9.0  8.4 - 10.5 mg/dL   Total Protein 7.5  6.0 - 8.3 g/dL   Albumin 2.8 (*) 3.5 - 5.2 g/dL   AST 89 (*) 0 - 37 U/L   ALT 59 (*) 0 - 53 U/L   Alkaline Phosphatase 334 (*) 39 - 117 U/L   Total Bilirubin 0.9  0.3 - 1.2 mg/dL   GFR calc non Af Amer 54 (*) >90 mL/min   GFR calc Af Amer 63  (*) >90 mL/min  PROTIME-INR      Result Value Ref Range   Prothrombin Time 15.5 (*) 11.6 - 15.2 seconds   INR 1.26  0.00 - 1.49  CBC      Result Value Ref Range   WBC 9.3  4.0 - 10.5 K/uL   RBC 4.21 (*) 4.22 - 5.81 MIL/uL   Hemoglobin 13.6  13.0 - 17.0 g/dL   HCT 40.4  39.0 - 52.0 %   MCV 96.0  78.0 - 100.0 fL   MCH 32.3  26.0 - 34.0 pg   MCHC 33.7  30.0 - 36.0 g/dL   RDW 13.4  11.5 - 15.5 %   Platelets 116 (*) 150 - 400 K/uL  GLUCOSE, CAPILLARY      Result Value Ref Range   Glucose-Capillary 209 (*) 70 - 99 mg/dL  GLUCOSE, CAPILLARY      Result Value Ref Range   Glucose-Capillary 166 (*) 70 - 99 mg/dL  GLUCOSE, CAPILLARY      Result Value Ref Range   Glucose-Capillary 170 (*) 70 - 99 mg/dL  GLUCOSE, CAPILLARY      Result Value Ref Range   Glucose-Capillary 190 (*) 70 - 99 mg/dL  I-STAT TROPOININ, ED      Result Value Ref Range   Troponin i, poc 0.39 (*) 0.00 - 0.08 ng/mL   Comment NOTIFIED PHYSICIAN     Comment 3            A/P: Pt with remote hx prostate cancer, prior L1/3 KP,  recent fall at home with persistent back/sacral pain significantly limiting mobility and evidence of sacral insufficiency fractures on bone scan. Tent plan is for sacroplasty on 4/7 at [facility] by Dr. Estanislado Pandy. Details/risks of procedure d/w pt/wife with their understanding and consent. Following procedure pt will return to [facility]. Dr. Verlon Au aware of above. Pt will receive prophylactic IV antibiotic prior to procedure but may benefit from extended  2-3 day course due to recent Proteus noted on urine culture. Aspirin does not need to be held prior to sacroplasty.

## 2013-12-31 NOTE — Progress Notes (Signed)
Note: This document was prepared with digital dictation and possible smart phrase technology. Any transcriptional errors that result from this process are unintentional.   Guy [name] MSX:[id number] DOB: 12/26/1919 DOA: 12/29/2013 PCP: [name], MD  Brief narrative: 80 y/o ?, known prior h/o CAd s/p stent 1996, Last cath 2003 patent, known Htn, Hldm, NIDDM, Met prostate Ca [used to be on Lurpon-currently on Casodex]-followed at [facility] by Dr. Marcello Moores, Prior vertebroplasties 10/12/13 L1 vertebra, prior MRSA pneumonia/Klebsialle Bacteremia + DVT 12/2012, Kyphoplasty 11/14/12 admitted from home with worsening back pain and mid lower back 10/10 intensity. He was recently seen 3/31 in the emergency room because of fall and weakness and was ultimately found to be unable to ambulate Patient was admitted and x-rays MRI was performed in 4 to known area of pain was denoted and hence the bone scan was ordered 12/31/13  Past medical history-As per Problem list Chart reviewed as below- Reviewed  Consultants:  Interventional radiology  Procedures:  Lumbar and sacral x-rays  Antibiotics:  None   Subjective   Doing fair. Rates pain 4/10 at rest Nonambulatory as a Tolerating some diet No fever or chills No nausea vomiting   Objective    Interim History: None  Telemetry: Sinus bradycardia on telemetry 30s to 40s   Objective: Filed Vitals:   12/30/13 2059 12/31/13 0500 12/31/13 0618 12/31/13 1444  BP:   151/70 157/70  Pulse:   67 68  Temp: 99.1 F (37.3 C)  98 F (36.7 C) 98.4 F (36.9 C)  TempSrc:   Oral Oral  Resp:   18 20  Height:      Weight:  57.9 kg (127 lb 10.3 oz)    SpO2:   98% 97%    Intake/Output Summary (Last 24 hours) at 12/31/13 1547 Last data filed at 12/31/13 2336  Gross per 24 hour  Intake    360 ml  Output    700 ml  Net   -340 ml    Exam:  General: Alert pleasant oriented in no apparent distress Cardiovascular: S1-S2  bradycardic Respiratory: Clinically clear Abdomen: No tenderness no rebound Skin no lower extremity edema Neuro able to lift both feet off the bed  Data Reviewed: Basic Metabolic Panel:  Recent Labs Lab 12/27/13 2208 12/29/13 1120 12/30/13 0345 12/31/13 0400  NA 135* 137 139 137  K 4.0 3.9 3.7 3.7  CL 97 99 103 102  CO2 24 23 26 23   GLUCOSE 159* 223* 141* 146*  BUN 24* 24* 25* 23  CREATININE 1.39* 1.43* 1.28 1.14  CALCIUM 10.2 9.5 8.8 9.0   Liver Function Tests:  Recent Labs Lab 12/30/13 0345 12/31/13 0400  AST 120* 89*  ALT 64* 59*  ALKPHOS 343* 334*  BILITOT 1.6* 0.9  PROT 6.9 7.5  ALBUMIN 2.7* 2.8*   No results found for this basename: LIPASE, AMYLASE,  in the last 168 hours No results found for this basename: AMMONIA,  in the last 168 hours CBC:  Recent Labs Lab 12/27/13 2208 12/29/13 1120 12/30/13 0345 12/31/13 0400  WBC 11.8* 9.2 8.1 9.3  NEUTROABS 9.9* 6.9  --   --   HGB 13.9 13.1 12.6* 13.6  HCT 41.1 38.8* 37.9* 40.4  MCV 95.6 95.8 96.9 96.0  PLT 132* 110* 123* 116*   Cardiac Enzymes:  Recent Labs Lab 12/30/13 1057 12/30/13 1521 12/30/13 2145  TROPONINI 0.32* <0.30 <0.30   BNP: No components found with this basename: POCBNP,  CBG:  Recent Labs Lab  12/30/13 1148 12/30/13 1755 12/30/13 2233 12/31/13 0748 12/31/13 1156  GLUCAP 201* 209* 166* 170* 190*    Recent Results (from the past 240 hour(s))  URINE CULTURE     Status: None   Collection Time    12/27/13 10:21 PM      Result Value Ref Range Status   Specimen Description URINE, RANDOM   Final   Special Requests NONE   Final   Culture  Setup Time     Final   Value: 12/28/2013 05:58     Performed at [facility]     Final   Value: >=100,000 COLONIES/ML     Performed at [facility]   Culture     Final   Value: PROTEUS MIRABILIS     Performed at [facility]   Report Status 12/30/2013 FINAL   Final   Organism ID, Bacteria PROTEUS  MIRABILIS   Final  MRSA PCR SCREENING     Status: Abnormal   Collection Time    12/29/13 10:23 PM      Result Value Ref Range Status   MRSA by PCR POSITIVE (*) NEGATIVE Final   Comment:            The GeneXpert MRSA Assay (FDA     approved for NASAL specimens     only), is one component of a     comprehensive MRSA colonization     surveillance program. It is not     intended to diagnose MRSA     infection nor to guide or     monitor treatment for     MRSA infections.     RESULT CALLED TO, READ BACK BY AND VERIFIED WITH:     TINER,A/4W @2340  ON 12/29/13 BY KARCZEWSKI,S.     Studies:              All Imaging reviewed and is as per above notation   Scheduled Meds: . amLODipine  10 mg Oral Daily  . aspirin  81 mg Oral Daily  . atorvastatin  20 mg Oral QHS  . bicalutamide  50 mg Oral Daily  . calcium-vitamin D  1 tablet Oral BID  . Chlorhexidine Gluconate Cloth  6 each Topical Q0600  . docusate sodium  100 mg Oral Q12H  . feeding supplement (GLUCERNA SHAKE)  237 mL Oral Q24H  . fluticasone  1 spray Each Nare BID  . hydrOXYzine  25 mg Oral BID  . insulin aspart  0-15 Units Subcutaneous TID WC  . insulin aspart  0-5 Units Subcutaneous QHS  . insulin glargine  16 Units Subcutaneous QHS  . lipase/protease/amylase  4 capsule Oral TID WC  . metoprolol  12.5 mg Oral Daily  . multivitamin with minerals  1 tablet Oral q morning - 10a  . mupirocin ointment  1 application Nasal BID  . oxybutynin  5 mg Oral QHS   Continuous Infusions:    Assessment/Plan: 1. Back pain with thoracic and lumbar compression fractures noted in the past-interventional radiology has ordered a bone scan which is pending. For now continue hydrocodone 1-2 tabs every 4 when necessary 5/325, morphine 2 mg every 4 when necessary. He will probably need skilled nursing care 2. Asymptomatic bacteria-would not treat at this stage 3. Elevated troponin-medical management as per cardiology. Troponin trending down 0.32.   Aspirin 81 mg daily-no further work-up at this stage--I will hold off on his aspirin in case kyphoplasty may need to be performed  4. Type 2 diabetes mellitus-continue Lantus 16 units each bedtime sliding-scale coverage discontinue Amaryl while in hospital-blood sugars ranging between 170 and 190 with sensitive coverage 5. Hypertension with complication of sinus bradycardia- continue metoprolol at low dose 12.5 daily  continue with amlodipine 10 mg  6. Thrombocytopenia, prior DVT-thrombocytopenia potentially secondary to medication vs. cancer currently not on on Coumadin continue aspirin 81 mg daily-this is been held in anticipation of procedure 7. Metastatic prostate cancer diag 1982, Rx Flippin with spinal and rib metastases-continue Casodex 50 mg daily used to be on Lupron till 200[address]. Outpatient PET scan should be performed at some point-might need re-staging given all of these fractures which might be malignant 8. History of urinary incontinence is secondary to artificial sphincter placement followed by Dr. Laverle Hobby replaced 4/2 9. Volume depletion-discontinued IV saline 50 cc per hour, force fluids 10. Moderate protein energy malnutrition, will obtain nutrition consult-down drainage dysphagia 2 secondary to poor dentition, recommended Glucerna 11. Elevated LFTs-probably secondary to bony metastases or from Casodex--ALT/AST are not elevated but we'll need to monitor closely for repeat labs in the morning 12. Hyperlipidemia-continue atorvastatin 20 daily 13. Constipation continue Colace  Code Status: Full Family Communication:  None at bedside Disposition Plan: Inpatient   Verneita Griffes, MD  [facility] Pager [phone] 12/31/2013, 3:47 PM    LOS: 2 days

## 2013-12-31 NOTE — Plan of Care (Signed)
Problem: Phase I Progression Outcomes Goal: Voiding-avoid urinary catheter unless indicated Outcome: Not Progressing Patient has chronic foley catheter

## 2013-12-31 NOTE — Progress Notes (Signed)
Patient ID: Guy [name], male   DOB: Jun 29, 1920, 80 y.o.   MRN: [id number] Recent MRI L spine reviewed by Dr. Estanislado Pandy. There are no acute fractures noted of lower thoracic or lumbar spine; however, sufficient imaging to completely r/o sacral fracture is not present. Since pt is moderately tender in sacral region as well recommend bone scan to further eval for possible sacral insufficiency fracture.

## 2013-12-31 NOTE — Care Management Note (Addendum)
    Page 1 of 2   01/07/2014     11:19:57 AM   CARE MANAGEMENT NOTE 01/07/2014  Patient:  Guy [name], Guy [name]   Account Number:  [id number]  Date Initiated:  12/30/2013  Documentation initiated by:  Gabriel Earing  Subjective/Objective Assessment:   Pt admitted with cco lumbar fx, unable to ambulate     Action/Plan:   from hom   Anticipated DC Date:  01/05/2014   Anticipated DC Plan:  [facility] referral  Clinical Social Worker      DC Planning Services  CM consult      Choice offered to / List presented to:  C-3 Spouse        HH arranged  HH-2 PT      [employer]   Status of service:  Completed, signed off Medicare Important Message given?  NA - LOS <3 / Initial given by admissions (If response is "NO", the following Medicare IM given date fields will be blank) Date Medicare IM given:   Date Additional Medicare IM given:    Discharge Disposition:  [place]  Per UR Regulation:  Reviewed for med. necessity/level of care/duration of stay  If discussed at [place] of Stay Meetings, dates discussed:    Comments:  01/07/14 Chayson Charters RN,BSN NCM [place] PCP,& [place].TC [facility] REP MARY MANLEY-INFORMED HER OF F/U NEEDED.SHE WILL CONTACT HH PERSON FOLLOWING TO CONTACT PATIENT @ HOME.NO FURTHER F/U NEEDED.TC NSG INFORMED THEM OF THE OUTCOME.  01/05/14 Liam Cammarata RN,BSN NCM  [phone] TC [facility] AWARE OF D/C TODAY W/HHPT-[facility].PATIENT HAS RW.[facility] HHPT TC-OFFICE AWARE OF D/C HOME W/HHPT.   01/03/14 Allis Quirarte RN,BSN NCM [phone] FOR SACROPLASTY IN [facility] HHC FOLLOWING FOR D/C NEEDS.AWAIT FINAL HHC ORDERS.  12/31/13 Siham Bucaro RN,BSN NCM [place] REP MADE AWARE OF REFERRAL IF HH NEEDED.AWAIT FINAL HHC ORDERS.  12/31/13 RUTH MIRINGU RN BSN Pt is only qualifying for OBS status. Spoke to wife aout this. She stated that if Medicare cannot pay for  his rehab then she will take him home. She has used [facility] in the past and wishes to use them again.  12/30/13 MMCGIBBONEY, RN, BSN UR completed.

## 2014-01-01 DIAGNOSIS — M8448XA Pathological fracture, other site, initial encounter for fracture: Secondary | ICD-10-CM | POA: Diagnosis not present

## 2014-01-01 LAB — COMPREHENSIVE METABOLIC PANEL
ALBUMIN: 2.6 g/dL — AB (ref 3.5–5.2)
ALT: 66 U/L — ABNORMAL HIGH (ref 0–53)
AST: 112 U/L — AB (ref 0–37)
Alkaline Phosphatase: 448 U/L — ABNORMAL HIGH (ref 39–117)
BILIRUBIN TOTAL: 1 mg/dL (ref 0.3–1.2)
BUN: 24 mg/dL — ABNORMAL HIGH (ref 6–23)
CHLORIDE: 101 meq/L (ref 96–112)
CO2: 26 mEq/L (ref 19–32)
Calcium: 9.1 mg/dL (ref 8.4–10.5)
Creatinine, Ser: 1.23 mg/dL (ref 0.50–1.35)
GFR calc Af Amer: 57 mL/min — ABNORMAL LOW (ref >90)
GFR calc non Af Amer: 49 mL/min — ABNORMAL LOW (ref >90)
Glucose, Bld: 119 mg/dL — ABNORMAL HIGH (ref 70–99)
Potassium: 4 mEq/L (ref 3.7–5.3)
Sodium: 139 mEq/L (ref 137–147)
Total Protein: 7.1 g/dL (ref 6.0–8.3)

## 2014-01-01 LAB — GLUCOSE, CAPILLARY
GLUCOSE-CAPILLARY: 124 mg/dL — AB (ref 70–99)
GLUCOSE-CAPILLARY: 143 mg/dL — AB (ref 70–99)
Glucose-Capillary: 146 mg/dL — ABNORMAL HIGH (ref 70–99)
Glucose-Capillary: 179 mg/dL — ABNORMAL HIGH (ref 70–99)

## 2014-01-01 LAB — CBC
HCT: 36.7 % — ABNORMAL LOW (ref 39.0–52.0)
HEMOGLOBIN: 12.2 g/dL — AB (ref 13.0–17.0)
MCH: 32.2 pg (ref 26.0–34.0)
MCHC: 33.2 g/dL (ref 30.0–36.0)
MCV: 96.8 fL (ref 78.0–100.0)
Platelets: 133 10*3/uL — ABNORMAL LOW (ref 150–400)
RBC: 3.79 MIL/uL — AB (ref 4.22–5.81)
RDW: 13.6 % (ref 11.5–15.5)
WBC: 7.2 10*3/uL (ref 4.0–10.5)

## 2014-01-01 MED ORDER — CIPROFLOXACIN HCL 500 MG PO TABS
500.0000 mg | ORAL_TABLET | Freq: Every day | ORAL | Status: AC
  Administered 2014-01-01 – 2014-01-03 (×3): 500 mg via ORAL
  Filled 2014-01-01 (×3): qty 1

## 2014-01-01 MED ORDER — NITROFURANTOIN MACROCRYSTAL 100 MG PO CAPS: 100.0000 mg | ORAL_CAPSULE | Freq: Two times a day (BID) | ORAL | Status: DC

## 2014-01-01 NOTE — Progress Notes (Signed)
Note: This document was prepared with digital dictation and possible smart phrase technology. Any transcriptional errors that result from this process are unintentional.   Guy [name]:[id number] DOB: 01-10-1920 DOA: 12/29/2013 PCP: [name], MD  Brief narrative: 80 y/o ?, known prior h/o CAd s/p stent 1996, Last cath 2003 patent, known Htn, Hldm, NIDDM, Met prostate Ca [used to be on Lurpon-currently on Casodex]-followed at [facility] by Dr. Marcello Moores, Prior vertebroplasties 10/12/13 L1 vertebra, prior MRSA pneumonia/Klebsialle Bacteremia + DVT 12/2012, Kyphoplasty 11/14/12 admitted from home with worsening back pain and mid lower back 10/10 intensity. He was recently seen 3/31 in the emergency room because of fall and weakness and was ultimately found to be unable to ambulate Patient was admitted and x-rays MRI was performed in 4 to known area of pain was denoted and hence the bone scan was ordered 12/31/13 which showed potential sacral insufficiency fracture and it was thought that patient would be amenable to sacral plasty with which is scheduled for 01/04/14  Past medical history-As per Problem list Chart reviewed as below- Reviewed  Consultants:  Interventional radiology  Procedures:  Lumbar and sacral x-rays   MRI back  Triple Phase bone scan   Antibiotics:  None   Subjective   Doing fair. Currently rating pain 10/10 in bed Also has pain across muscle skeletal and bony prominences of ribs anterior chest wall. No shortness of breath, No intermittent features and no worsening with activity suggestive of angina Tolerating diet somewhat   Objective    Interim History: None  Telemetry: PVCs   Objective: Filed Vitals:   12/31/13 1444 12/31/13 2154 01/01/14 0638 01/01/14 0735  BP: 157/70 161/64 187/80 159/80  Pulse: 68 71 71   Temp: 98.4 F (36.9 C) 98.2 F (36.8 C) 97.7 F (36.5 C)   TempSrc: Oral Axillary Oral   Resp: _0 Height:       Weight:   57.6 kg (126 lb 15.8 oz)   SpO2: 97% 94% 99%     Intake/Output Summary (Last 24 hours) at 01/01/14 1305 Last data filed at 01/01/14 0703  Gross per 24 hour  Intake    720 ml  Output    650 ml  Net     70 ml    Exam:  General: Alert pleasant oriented in no apparent distress Cardiovascular: S1-S2 bradycardic Respiratory: Clinically clear Abdomen: No tenderness no rebound Skin no lower extremity edema Neuro able to lift both feet off the bed  Data Reviewed: Basic Metabolic Panel:  Recent Labs Lab 12/27/13 2208 12/29/13 1120 12/30/13 0345 12/31/13 0400 01/01/14 0420  NA 135* 137 139 137 139  K 4.0 3.9 3.7 3.7 4.0  CL 97 99 103 102 101  CO2 _1 GLUCOSE 159* 223* 141* 146* 119*  BUN 24* 24* 25* 23 24*  CREATININE 1.39* 1.43* 1.28 1.14 1.23  CALCIUM 10.2 9.5 8.8 9.0 9.1   Liver Function Tests:  Recent Labs Lab 12/30/13 0345 12/31/13 0400 01/01/14 0420  AST 120* 89* 112*  ALT 64* 59* 66*  ALKPHOS 343* 334* 448*  BILITOT 1.6* 0.9 1.0  PROT 6.9 7.5 7.1  ALBUMIN 2.7* 2.8* 2.6*   No results found for this basename: LIPASE, AMYLASE,  in the last 168 hours No results found for this basename: AMMONIA,  in the last 168 hours CBC:  Recent Labs Lab 12/27/13 2208 12/29/13 1120 12/30/13 0345 12/31/13 0400 01/01/14 0420  WBC 11.8* 9.2 8.1  9.3 7.2  NEUTROABS 9.9* 6.9  --   --   --   HGB 13.9 13.1 12.6* 13.6 12.2*  HCT 41.1 38.8* 37.9* 40.4 36.7*  MCV 95.6 95.8 96.9 96.0 96.8  PLT 132* 110* 123* 116* 133*   Cardiac Enzymes:  Recent Labs Lab 12/30/13 1057 12/30/13 1521 12/30/13 2145  TROPONINI 0.32* <0.30 <0.30   BNP: No components found with this basename: POCBNP,  CBG:  Recent Labs Lab 12/31/13 1156 12/31/13 1634 12/31/13 2147 01/01/14 0734 01/01/14 1127  GLUCAP 190* 129* 231* 124* 143*    Recent Results (from the past 240 hour(s))  URINE CULTURE     Status: None   Collection Time    12/27/13 10:21 PM      Result  Value Ref Range Status   Specimen Description URINE, RANDOM   Final   Special Requests NONE   Final   Culture  Setup Time     Final   Value: 12/28/2013 05:58     Performed at [facility]     Final   Value: >=100,000 COLONIES/ML     Performed at [facility]   Culture     Final   Value: PROTEUS MIRABILIS     Performed at [facility]   Report Status 12/30/2013 FINAL   Final   Organism ID, Bacteria PROTEUS MIRABILIS   Final  MRSA PCR SCREENING     Status: Abnormal   Collection Time    12/29/13 10:23 PM      Result Value Ref Range Status   MRSA by PCR POSITIVE (*) NEGATIVE Final   Comment:            The GeneXpert MRSA Assay (FDA     approved for NASAL specimens     only), is one component of a     comprehensive MRSA colonization     surveillance program. It is not     intended to diagnose MRSA     infection nor to guide or     monitor treatment for     MRSA infections.     RESULT CALLED TO, READ BACK BY AND VERIFIED WITH:     TINER,A/4W _0  ON 12/29/13 BY KARCZEWSKI,S.     Studies:              All Imaging reviewed and is as per above notation   Scheduled Meds: . amLODipine  10 mg Oral Daily  . atorvastatin  20 mg Oral QHS  . bicalutamide  50 mg Oral Daily  . calcium-vitamin D  1 tablet Oral BID  . Chlorhexidine Gluconate Cloth  6 each Topical Q0600  . docusate sodium  100 mg Oral Q12H  . feeding supplement (GLUCERNA SHAKE)  237 mL Oral Q24H  . fluticasone  1 spray Each Nare BID  . hydrOXYzine  25 mg Oral BID  . insulin aspart  0-15 Units Subcutaneous TID WC  . insulin aspart  0-5 Units Subcutaneous QHS  . insulin glargine  16 Units Subcutaneous QHS  . lipase/protease/amylase  4 capsule Oral TID WC  . metoprolol  12.5 mg Oral Daily  . multivitamin with minerals  1 tablet Oral q morning - 10a  . mupirocin ointment  1 application Nasal BID  . oxybutynin  5 mg Oral QHS   Continuous Infusions:    Assessment/Plan: 1. Back  pain with thoracic and lumbar compression fractures noted -bone scan shows sacral insufficiency For now continue hydrocodone 1-2  tabs every 4 when necessary 5/325, morphine 2 mg every 4 when necessary. He will probably need skilled nursing care 2. Asymptomatic bacteria-would not treat at this stage-will give empiric treatment for concerned that this may cause seeding however and we'll start him on Macrodantin 100 q. 12 for 3 days 3. Elevated troponin-medical management as per cardiology. Troponin trending down 0.32.  Aspirin 81 mg daily-no further work-up at this stage. Restart aspirin 81 mg for secondary intention 4. Type 2 diabetes mellitus-continue Lantus 16 units each bedtime sliding-scale coverage discontinue Amaryl while in hospital-blood sugars ranging between 120-145 with sensitive coverage 5. Hypertension with complication of sinus bradycardia- continue metoprolol at low dose 12.5 daily  continue with amlodipine 10 mg-has PVCs but no other other labs today 6. Thrombocytopenia, prior DVT-thrombocytopenia potentially secondary to medication vs. cancer currently not on on Coumadin continue aspirin 81 mg daily-monitor in a.m. 7. Metastatic prostate cancer diag 1982, Rx Ila with spinal and rib metastases-continue Casodex 50 mg daily used to be on Lupron till 20013. Outpatient PET scan should be performed at some point-might need re-staging given all of these fractures which might be malignant 8. History of urinary incontinence is secondary to artificial sphincter placement followed by Dr. Laverle Hobby replaced 4/2 9. Volume depletion-discontinued IV saline 50 cc per hour 4/2, force fluids 10. Moderate protein energy malnutrition, will obtain nutrition consult-down drainage dysphagia 2 secondary to poor dentition, recommended Glucerna 11. Elevated LFTs-probably secondary to bony metastases or from Casodex--ALT/AST are not elevated. Will monitor daily. Alkaline phosphatase is probably  elevated because of bony insufficiencies 12. Hyperlipidemia-continue atorvastatin 20 daily 13. Constipation continue Colace  Code Status: Full Family Communication:  Discussed with wife at bedside Disposition Plan: Inpatient  15 minutes  Verneita Griffes, MD  [facility] Pager [phone] 01/01/2014, 1:05 PM    LOS: 3 days

## 2014-01-02 DIAGNOSIS — M8448XA Pathological fracture, other site, initial encounter for fracture: Secondary | ICD-10-CM | POA: Diagnosis not present

## 2014-01-02 LAB — COMPREHENSIVE METABOLIC PANEL
ALK PHOS: 525 U/L — AB (ref 39–117)
ALT: 64 U/L — ABNORMAL HIGH (ref 0–53)
AST: 103 U/L — AB (ref 0–37)
Albumin: 2.6 g/dL — ABNORMAL LOW (ref 3.5–5.2)
BILIRUBIN TOTAL: 0.9 mg/dL (ref 0.3–1.2)
BUN: 25 mg/dL — ABNORMAL HIGH (ref 6–23)
CHLORIDE: 101 meq/L (ref 96–112)
CO2: 29 mEq/L (ref 19–32)
Calcium: 9.5 mg/dL (ref 8.4–10.5)
Creatinine, Ser: 1.31 mg/dL (ref 0.50–1.35)
GFR, EST AFRICAN AMERICAN: 53 mL/min — AB (ref >90)
GFR, EST NON AFRICAN AMERICAN: 46 mL/min — AB (ref >90)
GLUCOSE: 93 mg/dL (ref 70–99)
POTASSIUM: 4.2 meq/L (ref 3.7–5.3)
SODIUM: 138 meq/L (ref 137–147)
Total Protein: 7 g/dL (ref 6.0–8.3)

## 2014-01-02 LAB — CBC
HCT: 37.1 % — ABNORMAL LOW (ref 39.0–52.0)
Hemoglobin: 12.4 g/dL — ABNORMAL LOW (ref 13.0–17.0)
MCH: 32 pg (ref 26.0–34.0)
MCHC: 33.4 g/dL (ref 30.0–36.0)
MCV: 95.9 fL (ref 78.0–100.0)
PLATELETS: 139 10*3/uL — AB (ref 150–400)
RBC: 3.87 MIL/uL — AB (ref 4.22–5.81)
RDW: 13.7 % (ref 11.5–15.5)
WBC: 7.1 10*3/uL (ref 4.0–10.5)

## 2014-01-02 LAB — GLUCOSE, CAPILLARY
GLUCOSE-CAPILLARY: 149 mg/dL — AB (ref 70–99)
Glucose-Capillary: 108 mg/dL — ABNORMAL HIGH (ref 70–99)
Glucose-Capillary: 190 mg/dL — ABNORMAL HIGH (ref 70–99)
Glucose-Capillary: 210 mg/dL — ABNORMAL HIGH (ref 70–99)

## 2014-01-02 NOTE — Progress Notes (Signed)
Note: This document was prepared with digital dictation and possible smart phrase technology. Any transcriptional errors that result from this process are unintentional.   Guy [name]:[id number] DOB: 1920/04/28 DOA: 12/29/2013 PCP: Guy Patricia, MD  Brief narrative:  80 y/o ?, known prior h/o CAd s/p stent 1996, Last cath 2003 patent, known Htn, Hldm, NIDDM, Met prostate Ca [used to be on Lurpon-currently on Casodex]-followed at [facility] by Dr. Marcello Moores, Prior vertebroplasties 10/12/13 L1 vertebra, prior MRSA pneumonia/Klebsialle Bacteremia + DVT 12/2012, Kyphoplasty 11/14/12 admitted from home with worsening back pain and mid lower back 10/10 intensity. He was recently seen 3/31 in the emergency room because of fall and weakness and was ultimately found to be unable to ambulate Patient was admitted and x-rays MRI was performed in 4 to known area of pain was denoted and hence the bone scan was ordered 12/31/13 which showed potential sacral insufficiency fracture and it was thought that patient would be amenable to sacral plasty with which is scheduled for 01/04/14  Past medical history-As per Problem list Chart reviewed as below- Reviewed  Consultants:  Interventional radiology  Procedures:  Lumbar and sacral x-rays   MRI back  Triple Phase bone scan   Antibiotics:  None   Subjective   Doing fair. Currentlypain is better controlled Still has rib pain No intermittent features and no worsening with activity suggestive of angina Tolerating diet somewhat   Objective    Interim History: None  Telemetry: PVCs   Objective: Filed Vitals:   01/01/14 1517 01/01/14 2122 01/02/14 0511 01/02/14 1004  BP: 134/58 143/59 157/78 152/72  Pulse: 63 66 64 66  Temp: 97.8 F (36.6 C) 98.1 F (36.7 C) 97.7 F (36.5 C)   TempSrc: Axillary Oral Oral   Resp: 18 18 17    Height:      Weight:   57.7 kg (127 lb 3.3 oz)   SpO2: 96% 93% 99%     Intake/Output  Summary (Last 24 hours) at 01/02/14 1432 Last data filed at 01/02/14 1102  Gross per 24 hour  Intake    480 ml  Output    625 ml  Net   -145 ml    Exam:  General: Alert pleasant oriented in no apparent distress Cardiovascular: S1-S2 bradycardic Respiratory: Clinically clear Abdomen: No tenderness no rebound Skin no lower extremity edema Neuro able to lift both feet off the bed  Data Reviewed: Basic Metabolic Panel:  Recent Labs Lab 12/29/13 1120 12/30/13 0345 12/31/13 0400 01/01/14 0420 01/02/14 0358  NA 137 139 137 139 138  K 3.9 3.7 3.7 4.0 4.2  CL 99 103 102 101 101  CO2 23 26 23 26 29   GLUCOSE 223* 141* 146* 119* 93  BUN 24* 25* 23 24* 25*  CREATININE 1.43* 1.28 1.14 1.23 1.31  CALCIUM 9.5 8.8 9.0 9.1 9.5   Liver Function Tests:  Recent Labs Lab 12/30/13 0345 12/31/13 0400 01/01/14 0420 01/02/14 0358  AST 120* 89* 112* 103*  ALT 64* 59* 66* 64*  ALKPHOS 343* 334* 448* 525*  BILITOT 1.6* 0.9 1.0 0.9  PROT 6.9 7.5 7.1 7.0  ALBUMIN 2.7* 2.8* 2.6* 2.6*   No results found for this basename: LIPASE, AMYLASE,  in the last 168 hours No results found for this basename: AMMONIA,  in the last 168 hours CBC:  Recent Labs Lab 12/27/13 2208 12/29/13 1120 12/30/13 0345 12/31/13 0400 01/01/14 0420 01/02/14 0358  WBC 11.8* 9.2 8.1 9.3 7.2 7.1  NEUTROABS 9.9* 6.9  --   --   --   --  HGB 13.9 13.1 12.6* 13.6 12.2* 12.4*  HCT 41.1 38.8* 37.9* 40.4 36.7* 37.1*  MCV 95.6 95.8 96.9 96.0 96.8 95.9  PLT 132* 110* 123* 116* 133* 139*   Cardiac Enzymes:  Recent Labs Lab 12/30/13 1057 12/30/13 1521 12/30/13 2145  TROPONINI 0.32* <0.30 <0.30   BNP: No components found with this basename: POCBNP,  CBG:  Recent Labs Lab 01/01/14 1127 01/01/14 1646 01/01/14 2108 01/02/14 0754 01/02/14 1203  GLUCAP 143* 146* 179* 108* 149*    Recent Results (from the past 240 hour(s))  URINE CULTURE     Status: None   Collection Time    12/27/13 10:21 PM       Result Value Ref Range Status   Specimen Description URINE, RANDOM   Final   Special Requests NONE   Final   Culture  Setup Time     Final   Value: 12/28/2013 05:58     Performed at [facility]     Final   Value: >=100,000 COLONIES/ML     Performed at [facility]   Culture     Final   Value: PROTEUS MIRABILIS     Performed at [facility]   Report Status 12/30/2013 FINAL   Final   Organism ID, Bacteria PROTEUS MIRABILIS   Final  MRSA PCR SCREENING     Status: Abnormal   Collection Time    12/29/13 10:23 PM      Result Value Ref Range Status   MRSA by PCR POSITIVE (*) NEGATIVE Final   Comment:            The GeneXpert MRSA Assay (FDA     approved for NASAL specimens     only), is one component of a     comprehensive MRSA colonization     surveillance program. It is not     intended to diagnose MRSA     infection nor to guide or     monitor treatment for     MRSA infections.     RESULT CALLED TO, READ BACK BY AND VERIFIED WITH:     TINER,A/4W @2340  ON 12/29/13 BY KARCZEWSKI,S.     Studies:              All Imaging reviewed and is as per above notation   Scheduled Meds: . amLODipine  10 mg Oral Daily  . atorvastatin  20 mg Oral QHS  . bicalutamide  50 mg Oral Daily  . calcium-vitamin D  1 tablet Oral BID  . Chlorhexidine Gluconate Cloth  6 each Topical Q0600  . ciprofloxacin  500 mg Oral Q breakfast  . docusate sodium  100 mg Oral Q12H  . feeding supplement (GLUCERNA SHAKE)  237 mL Oral Q24H  . fluticasone  1 spray Each Nare BID  . hydrOXYzine  25 mg Oral BID  . insulin aspart  0-15 Units Subcutaneous TID WC  . insulin aspart  0-5 Units Subcutaneous QHS  . insulin glargine  16 Units Subcutaneous QHS  . lipase/protease/amylase  4 capsule Oral TID WC  . metoprolol  12.5 mg Oral Daily  . multivitamin with minerals  1 tablet Oral q morning - 10a  . mupirocin ointment  1 application Nasal BID  . oxybutynin  5 mg Oral QHS    Continuous Infusions:    Assessment/Plan: 1. Back pain with thoracic and lumbar compression fractures noted -bone scan shows sacral insufficiency fracture.For now continue hydrocodone 1-2 tabs every 4  when necessary 5/325, morphine 2 mg every 4 when necessary. He will probably need skilled nursing care 2. Asymptomatic bacteria-will give empiric treatment continue ofloxacin 500 every morning until 4/7 3. Elevated troponin-medical management as per cardiology. Troponin trending down 0.32.  Aspirin 81 mg daily-no further work-up at this stage. Restart aspirin 81 mg for secondary prevention 4. Type 2 diabetes mellitus-continue Lantus 16 units each bedtime sliding-scale coverage discontinue Amaryl while in hospital-blood sugars ranging between 108-149 5. Hypertension with complication of sinus bradycardia- continue metoprolol at low dose 12.5 daily  continue with amlodipine 10 mg-has PVCs  6. Thrombocytopenia, prior DVT-thrombocytopenia potentially secondary to medication vs. cancer currently not on on Coumadin continue aspirin 81 mg daily-monitor in a.m.-platelets are 139 7. Metastatic prostate cancer diag 1982, Rx [place] with spinal and rib metastases-continue Casodex 50 mg daily used to be on Lupron till 20013. Outpatient PET scan should be performed at some point-might need re-staging given all of these fractures which might be malignant 8. History of urinary incontinence is secondary to artificial sphincter placement followed by Dr. Laverle Hobby replaced 4/2 9. Volume depletion-discontinued IV saline 50 cc per hour 4/2, force fluids 10. Moderate protein energy malnutrition, will obtain nutrition consult-down drainage dysphagia 2 secondary to poor dentition, recommended Glucerna 11. Elevated LFTs-probably secondary to bony metastases or from Casodex--ALT/AST are not elevated. Will monitor daily. Alkaline phosphatase is probably elevated because of bony  insufficiencies 12. Hyperlipidemia-continue atorvastatin 20 daily 13. Constipation continue Colace  Code Status: Full Family Communication:  No family bedside today Disposition Plan: Inpatient  15 minutes  Verneita Griffes, MD  [facility] Pager [phone] 01/02/2014, 2:32 PM    LOS: 4 days

## 2014-01-03 DIAGNOSIS — M8448XA Pathological fracture, other site, initial encounter for fracture: Secondary | ICD-10-CM | POA: Diagnosis not present

## 2014-01-03 LAB — GLUCOSE, CAPILLARY
GLUCOSE-CAPILLARY: 95 mg/dL (ref 70–99)
GLUCOSE-CAPILLARY: 145 mg/dL — AB (ref 70–99)
Glucose-Capillary: 180 mg/dL — ABNORMAL HIGH (ref 70–99)
Glucose-Capillary: 180 mg/dL — ABNORMAL HIGH (ref 70–99)

## 2014-01-03 MED ORDER — VANCOMYCIN HCL IN DEXTROSE 1-5 GM/200ML-% IV SOLN
1000.0000 mg | INTRAVENOUS | Status: AC
  Administered 2014-01-04: 1000 mg via INTRAVENOUS
  Filled 2014-01-03: qty 200

## 2014-01-03 NOTE — Plan of Care (Signed)
Problem: Phase I Progression Outcomes Goal: Voiding-avoid urinary catheter unless indicated Outcome: Adequate for Discharge Chronic Cath  Problem: Phase III Progression Outcomes Goal: Foley discontinued Outcome: Adequate for Discharge Chronic Cath

## 2014-01-03 NOTE — Progress Notes (Signed)
Note: This document was prepared with digital dictation and possible smart phrase technology. Any transcriptional errors that result from this process are unintentional.   Guy [name] PHX:[id number] DOB: 1919/10/19 DOA: 12/29/2013 PCP: [name], MD  Brief narrative:  80 y/o ?, known prior h/o CAd s/p stent 1996, Last cath 2003 patent, known Htn, Hldm, NIDDM, Met prostate Ca [used to be on Lurpon-currently on Casodex]-followed at [facility] by Dr. Marcello Moores, Prior vertebroplasties 10/12/13 L1 vertebra, prior MRSA pneumonia/Klebsialle Bacteremia + DVT 12/2012, Kyphoplasty 11/14/12 admitted from home with worsening back pain and mid lower back 10/10 intensity. He was recently seen 3/31 in the emergency room because of fall and weakness and was ultimately found to be unable to ambulate Patient was admitted and x-rays MRI was performed in 4 to known area of pain was denoted and hence the bone scan was ordered 12/31/13 which showed potential sacral insufficiency fracture and it was thought that patient would be amenable to sacral plasty with which is scheduled for 01/04/14  Past medical history-As per Problem list Chart reviewed as below- Reviewed  Consultants:  Interventional radiology  Procedures:  Lumbar and sacral x-rays   MRI back  Triple Phase bone scan   Antibiotics:  None   Subjective   Doing fair. Currently pain 4/10 but jumps to 9/10 when moving Chest pain is better Tolerating diet No family bedside    Objective    Interim History: None  Telemetry: PVCs   Objective: Filed Vitals:   01/02/14 2130 01/03/14 0537 01/03/14 1000 01/03/14 1350  BP: 143/69 158/84 156/84 140/70  Pulse: 68 75 76 68  Temp: 98.1 F (36.7 C) 98.2 F (36.8 C) 98.2 F (36.8 C) 98.6 F (37 C)  TempSrc: Oral Oral Oral Oral  Resp: _0 Height:      Weight:  57.8 kg (127 lb 6.8 oz)    SpO2: 97% 99% 99%     Intake/Output Summary (Last 24 hours) at 01/03/14  1727 Last data filed at 01/03/14 1359  Gross per 24 hour  Intake    500 ml  Output    900 ml  Net   -400 ml    Exam:  General: Alert pleasant oriented in no apparent distress Cardiovascular: S1-S2 bradycardic Respiratory: Clinically clear Abdomen: No tenderness no rebound Skin no lower extremity edema Neuro able to lift both feet off the bed  Data Reviewed: Basic Metabolic Panel:  Recent Labs Lab 12/29/13 1120 12/30/13 0345 12/31/13 0400 01/01/14 0420 01/02/14 0358  NA 137 139 137 139 138  K 3.9 3.7 3.7 4.0 4.2  CL 99 103 102 101 101  CO2 _1 GLUCOSE 223* 141* 146* 119* 93  BUN 24* 25* 23 24* 25*  CREATININE 1.43* 1.28 1.14 1.23 1.31  CALCIUM 9.5 8.8 9.0 9.1 9.5   Liver Function Tests:  Recent Labs Lab 12/30/13 0345 12/31/13 0400 01/01/14 0420 01/02/14 0358  AST 120* 89* 112* 103*  ALT 64* 59* 66* 64*  ALKPHOS 343* 334* 448* 525*  BILITOT 1.6* 0.9 1.0 0.9  PROT 6.9 7.5 7.1 7.0  ALBUMIN 2.7* 2.8* 2.6* 2.6*   No results found for this basename: LIPASE, AMYLASE,  in the last 168 hours No results found for this basename: AMMONIA,  in the last 168 hours CBC:  Recent Labs Lab 12/27/13 2208 12/29/13 1120 12/30/13 0345 12/31/13 0400 01/01/14 0420 01/02/14 0358  WBC 11.8* 9.2 8.1 9.3 7.2 7.1  NEUTROABS 9.9* 6.9  --   --   --   --  HGB 13.9 13.1 12.6* 13.6 12.2* 12.4*  HCT 41.1 38.8* 37.9* 40.4 36.7* 37.1*  MCV 95.6 95.8 96.9 96.0 96.8 95.9  PLT 132* 110* 123* 116* 133* 139*   Cardiac Enzymes:  Recent Labs Lab 12/30/13 1057 12/30/13 1521 12/30/13 2145  TROPONINI 0.32* <0.30 <0.30   BNP: No components found with this basename: POCBNP,  CBG:  Recent Labs Lab 01/02/14 1203 01/02/14 1653 01/02/14 2132 01/03/14 0733 01/03/14 1141  GLUCAP 149* 190* 210* 95 180*    Recent Results (from the past 240 hour(s))  URINE CULTURE     Status: None   Collection Time    12/27/13 10:21 PM      Result Value Ref Range Status   Specimen  Description URINE, RANDOM   Final   Special Requests NONE   Final   Culture  Setup Time     Final   Value: 12/28/2013 05:58     Performed at [facility]   Colony Count     Final   Value: >=100,000 COLONIES/ML     Performed at [facility]   Culture     Final   Value: PROTEUS MIRABILIS     Performed at [facility]   Report Status 12/30/2013 FINAL   Final   Organism ID, Bacteria PROTEUS MIRABILIS   Final  MRSA PCR SCREENING     Status: Abnormal   Collection Time    12/29/13 10:23 PM      Result Value Ref Range Status   MRSA by PCR POSITIVE (*) NEGATIVE Final   Comment:            The GeneXpert MRSA Assay (FDA     approved for NASAL specimens     only), is one component of a     comprehensive MRSA colonization     surveillance program. It is not     intended to diagnose MRSA     infection nor to guide or     monitor treatment for     MRSA infections.     RESULT CALLED TO, READ BACK BY AND VERIFIED WITH:     TINER,A/4W @2340 ON 12/29/13 BY KARCZEWSKI,S.     Studies:              All Imaging reviewed and is as per above notation   Scheduled Meds: . amLODipine  10 mg Oral Daily  . atorvastatin  20 mg Oral QHS  . bicalutamide  50 mg Oral Daily  . calcium-vitamin D  1 tablet Oral BID  . docusate sodium  100 mg Oral Q12H  . feeding supplement (GLUCERNA SHAKE)  237 mL Oral Q24H  . fluticasone  1 spray Each Nare BID  . hydrOXYzine  25 mg Oral BID  . insulin aspart  0-15 Units Subcutaneous TID WC  . insulin aspart  0-5 Units Subcutaneous QHS  . insulin glargine  16 Units Subcutaneous QHS  . lipase/protease/amylase  4 capsule Oral TID WC  . metoprolol  12.5 mg Oral Daily  . multivitamin with minerals  1 tablet Oral q morning - 10a  . oxybutynin  5 mg Oral QHS  . [START ON 01/04/2014] vancomycin  1,000 mg Intravenous On Call   Continuous Infusions:    Assessment/Plan: 1. Back pain with thoracic and lumbar compression fractures noted -bone scan shows  sacral insufficiency fracture.For now continue hydrocodone 1-2 tabs every 4 when necessary 5/325, morphine 2 mg every 4 when necessary. He will probably need skilled nursing   care 2. Asymptomatic bacteria-will give empiric treatment continue ofloxacin 500 every morning until 4/7 3. Elevated troponin-medical management as per cardiology. Troponin trending down 0.32.  Aspirin 81 mg daily-no further work-up at this stage. Restart aspirin 81 mg for secondary prevention 4. Type 2 diabetes mellitus-continue Lantus 16 units each bedtime sliding-scale coverage discontinue Amaryl while in hospital-blood sugars ranging between 180-210  5. Hypertension with complication of sinus bradycardia- continue metoprolol at low dose 12.5 daily  continue with amlodipine 10 mg-has PVCs  6. Thrombocytopenia, prior DVT-thrombocytopenia potentially secondary to medication vs. cancer currently not on on Coumadin continue aspirin 81 mg daily-monitor in a.m.-platelets are 139 7. Metastatic prostate cancer diag 1982, Rx [place] with spinal and rib metastases-continue Casodex 50 mg daily used to be on Lupron till 20013. Outpatient PET scan should be performed at some point-might need re-staging given all of these fractures which might be malignant 8. History of urinary incontinence is secondary to artificial sphincter placement followed by Dr. Laverle Hobby replaced 4/2 9. Volume depletion-discontinued IV saline 50 cc per hour 4/2, force fluids 10. Moderate protein energy malnutrition, will obtain nutrition consult-down drainage dysphagia 2 secondary to poor dentition, recommended Glucerna 11. Elevated LFTs-probably secondary to bony metastases or from Casodex--ALT/AST are not elevated. Will monitor daily. Alkaline phosphatase is probably elevated because of bony insufficiencies 12. Hyperlipidemia-continue atorvastatin 20 daily 13. Constipation continue Colace  Code Status: Full Family Communication:  No family bedside  today Disposition Plan: Inpatient  15 minutes  Verneita Griffes, MD  [facility] Pager [phone] 01/03/2014, 5:27 PM    LOS: 5 days

## 2014-01-03 NOTE — Progress Notes (Signed)
IR PA has seen patient today, consented for sacroplasty 4/7 at [facility] (consent in chart), RN to arrange carelink to have patient at [facility] radiology by 11:00am tomorrow, orders placed.   Tsosie Billing PA-C Interventional Radiology  01/03/14  11:30 AM

## 2014-01-04 ENCOUNTER — Ambulatory Visit (HOSPITAL_COMMUNITY)
Admit: 2014-01-04 | Discharge: 2014-01-04 | Disposition: A | Discharge status: Home / Self Care | Payer: Medicare Other | Attending: Internal Medicine | Admitting: Internal Medicine

## 2014-01-04 DIAGNOSIS — M8448XA Pathological fracture, other site, initial encounter for fracture: Secondary | ICD-10-CM | POA: Diagnosis not present

## 2014-01-04 LAB — CBC
HEMATOCRIT: 38.9 % — AB (ref 39.0–52.0)
Hemoglobin: 13 g/dL (ref 13.0–17.0)
MCH: 31.8 pg (ref 26.0–34.0)
MCHC: 33.4 g/dL (ref 30.0–36.0)
MCV: 95.1 fL (ref 78.0–100.0)
Platelets: 190 10*3/uL (ref 150–400)
RBC: 4.09 MIL/uL — AB (ref 4.22–5.81)
RDW: 13.6 % (ref 11.5–15.5)
WBC: 9.6 10*3/uL (ref 4.0–10.5)

## 2014-01-04 LAB — PROTIME-INR
INR: 1.17 (ref 0.00–1.49)
Prothrombin Time: 14.7 seconds (ref 11.6–15.2)

## 2014-01-04 LAB — GLUCOSE, CAPILLARY: Glucose-Capillary: 129 mg/dL — ABNORMAL HIGH (ref 70–99)

## 2014-01-04 MED ORDER — HYDROMORPHONE HCL PF 1 MG/ML IJ SOLN
INTRAMUSCULAR | Status: AC
  Filled 2014-01-04: qty 2

## 2014-01-04 MED ORDER — MIDAZOLAM HCL 2 MG/2ML IJ SOLN
INTRAMUSCULAR | Status: AC
  Filled 2014-01-04: qty 4

## 2014-01-04 MED ORDER — VANCOMYCIN HCL IN DEXTROSE 1-5 GM/200ML-% IV SOLN
1000.0000 mg | Freq: Once | INTRAVENOUS | Status: DC
  Filled 2014-01-04: qty 200

## 2014-01-04 MED ORDER — METOPROLOL TARTRATE 12.5 MG HALF TABLET: 12.5000 mg | ORAL_TABLET | Freq: Every day | ORAL | Status: DC

## 2014-01-04 MED ORDER — HYDROCODONE-ACETAMINOPHEN 5-325 MG PO TABS: 1.0000 | ORAL_TABLET | ORAL | Status: DC | PRN

## 2014-01-04 MED ORDER — VANCOMYCIN HCL 10 G IV SOLR
1500.0000 mg | Freq: Once | INTRAVENOUS | Status: DC
  Filled 2014-01-04: qty 1500

## 2014-01-04 MED ORDER — MIDAZOLAM HCL 2 MG/2ML IJ SOLN
INTRAMUSCULAR | Status: DC | PRN
  Administered 2014-01-04 (×2): 1 mg via INTRAVENOUS

## 2014-01-04 MED ORDER — FENTANYL CITRATE 0.05 MG/ML IJ SOLN
INTRAMUSCULAR | Status: AC
  Filled 2014-01-04: qty 4

## 2014-01-04 MED ORDER — FENTANYL CITRATE 0.05 MG/ML IJ SOLN
INTRAMUSCULAR | Status: DC | PRN
  Administered 2014-01-04 (×2): 25 ug via INTRAVENOUS

## 2014-01-04 MED ORDER — SODIUM CHLORIDE 0.9 % IV SOLN
INTRAVENOUS | Status: AC
Start: 2014-01-04 — End: 2014-01-04
  Administered 2014-01-04: 17:00:00 via INTRAVENOUS

## 2014-01-04 MED ORDER — TOBRAMYCIN SULFATE 1.2 G IJ SOLR
INTRAMUSCULAR | Status: AC
  Filled 2014-01-04: qty 1.2

## 2014-01-04 NOTE — Progress Notes (Signed)
OT Cancellation Note  Patient Details Name: Guy [name] MRN: [id number] DOB: 26-Jul-1920   Cancelled Treatment:    Reason Eval/Treat Not Completed: Patient at procedure or test/ unavailable  [name] 01/04/2014, 1:20 PM Lesle Chris, OTR/L [phone] 01/04/2014

## 2014-01-04 NOTE — Discharge Summary (Signed)
Physician Discharge Summary  FLORENTINO LAABS JAS:[id number] DOB: [birth year] DOA: 12/29/2013  PCP: [name], MD  Admit date: 12/29/2013 Discharge date: 01/04/2014  Time spent: 35 minutes  Recommendations for Outpatient Follow-up:    1. Needs PT ot to assess prior to d/c 2. May benefit from [facility] healht PT/Ot 3. Rpt LFt's in 1 week 4. F/u [facility] for prostate CA-specifically in setting of myltole recent #'s may need re-staging 5. Metoprolol dosing changed this admit 2/2 to bradycrdia   Discharge Diagnoses:  Principal Problem:   Back pain Active Problems:   Prostate cancer metastatic to multiple sites   DM (diabetes mellitus)   Dehydration   Leukocytosis, unspecified   Essential hypertension, benign   Hyperlipidemia   CAD (coronary artery disease)   Acute venous embolism and thrombosis of deep vessels of proximal lower extremity   Urinary incontinence   Anemia of other chronic disease   Lumbar transverse process fracture   Fall   Thoracic compression fracture   Pain   Discharge Condition: fair  Diet recommendation:  hhealthy  Filed Weights   01/02/14 0511 01/03/14 0537 01/04/14 0511  Weight: 57.7 kg (127 lb 3.3 oz) 57.8 kg (127 lb 6.8 oz) 57.6 kg (126 lb 15.8 oz)    History of present illness:  80 y/o ?, known prior h/o CAd s/p stent 1996, Last cath 2003 patent, known Htn, Hldm, NIDDM, Met prostate Ca [used to be on Lurpon-currently on Casodex]-followed at [place] by Dr. Marcello Moores, Prior vertebroplasties 10/12/13 L1 vertebra, prior MRSA pneumonia/Klebsialle Bacteremia + DVT 12/2012, Kyphoplasty 11/14/12 admitted from home with worsening back pain and mid lower back 10/10 intensity. He was recently seen 3/31 in the emergency room because of fall and weakness and was ultimately found to be unable to ambulate  Patient was admitted and x-rays MRI was performed didn't clearly demarcate area of pain was denoted and hence the bone scan was ordered 12/31/13 which showed  potential sacral insufficiency fracture and it was thought that patient would be amenable to sacral plasty with which performed 01/04/14 Hospitalist on duty 4/8 to reassess patient prior to d/c home See below   Hospital Course:  :  1. Back pain with thoracic and lumbar compression fractures noted -bone scan shows sacral insufficiency fracture, s/p Sacroplasty For now continue hydrocodone 1-2 tabs every 4 when necessary 5/325, morphine 2 mg every 4 when necessary. Therpay to work with him prior to D/c 2. Asymptomatic bacteria-will give empiric treatment continue ofloxacin 500 every morning until 4/7. 3. Elevated troponin-medical management as per cardiology. Troponin trending down 0.32. Aspirin 81 mg daily-no further work-up at this stage. Restart aspirin 81 mg for secondary prevention 4. Type 2 diabetes mellitus-continue Lantus 16 units each bedtime sliding-scale coverage discontinue Amaryl while in hospital-blood sugars ranging between 129-180 5. Hypertension with complication of sinus bradycardia- continue metoprolol at low dose 12.5 daily [cut back from 50 daily] continue with amlodipine 10 mg-has PVCs  6. Thrombocytopenia, prior DVT-thrombocytopenia potentially secondary to medication vs. cancer currently not on on Coumadin continue aspirin 81 mg daily-monitor in a.m.-platelets are 139 7. Metastatic prostate cancer diag 1982, Rx [place] with spinal and rib metastases-continue Casodex 50 mg daily used to be on Lupron till 20013. Outpatient PET scan should be performed at some point-might need re-staging given all of these fractures which might be malignant 8. History of urinary incontinence is secondary to artificial sphincter placement followed by Dr. Laverle Hobby replaced 4/2 9. Volume depletion-discontinued IV saline 50 cc per  hour 4/2, force fluids 10. Moderate protein energy malnutrition, will obtain nutrition consult-down drainage dysphagia 2 secondary to poor dentition,  recommended Glucerna 11. Elevated LFTs-probably secondary to bony metastases or from Casodex--ALT/AST are not elevated. Will monitor daily. Alkaline phosphatase is probably elevated because of bony insufficiencies 12. Hyperlipidemia-continue atorvastatin 20 daily 13. Constipation continue Colace    Consultants:  Interventional radiology Procedures:  Lumbar and sacral x-rays  MRI back  Triple Phase bone scan Sacroplasty 01/04/14 Antibiotics:  None   Discharge Exam: Filed Vitals:   01/04/14 1717  BP: 134/75  Pulse: 74  Temp:   Resp:    Sleepy NO other issues Cannot fully assess General:  Eomi, ncat Cardiovascular:  s1 s2 no m/r/g Respiratory: clear  Discharge Instructions You were cared for by a hospitalist during your hospital stay. If you have any questions about your discharge medications or the care you received while you were in the hospital after you are discharged, you can call the unit and asked to speak with the hospitalist on call if the hospitalist that took care of you is not available. Once you are discharged, your primary care physician will handle any further medical issues. Please note that NO REFILLS for any discharge medications will be authorized once you are discharged, as it is imperative that you return to your primary care physician (or establish a relationship with a primary care physician if you do not have one) for your aftercare needs so that they can reassess your need for medications and monitor your lab values.     Medication List    STOP taking these medications       ciprofloxacin 500 MG tablet  Commonly known as:  CIPRO      TAKE these medications       amLODipine 10 MG tablet  Commonly known as:  NORVASC  Take 10 mg by mouth daily.     bicalutamide 50 MG tablet  Commonly known as:  CASODEX  Take 50 mg by mouth daily.     CALCIUM 600+D HIGH POTENCY 600-400 MG-UNIT per tablet  Generic drug:  Calcium Carbonate-Vitamin D  Take 1  tablet by mouth 2 (two) times daily.     carboxymethylcellulose 0.5 % Soln  Commonly known as:  REFRESH PLUS  Place 1 drop into both eyes 2 (two) times daily as needed (for dry eyes).     docusate sodium 100 MG capsule  Commonly known as:  COLACE  Take 1 capsule (100 mg total) by mouth every 12 (twelve) hours.     fluticasone 50 MCG/ACT nasal spray  Commonly known as:  FLONASE  Place 2 sprays into the nose 2 (two) times daily.     glimepiride 4 MG tablet  Commonly known as:  AMARYL  Take 4 mg by mouth daily before breakfast.     HYDROcodone-acetaminophen 5-325 MG per tablet  Commonly known as:  NORCO/VICODIN  Take 1 tablet by mouth every 6 (six) hours as needed.     HYDROcodone-acetaminophen 5-325 MG per tablet  Commonly known as:  NORCO/VICODIN  Take 1-2 tablets by mouth every 4 (four) hours as needed for moderate pain.     hydrOXYzine 25 MG tablet  Commonly known as:  ATARAX/VISTARIL  Take 25 mg by mouth 2 (two) times daily.     insulin aspart 100 UNIT/ML injection  Commonly known as:  novoLOG  Inject 0-6 Units into the skin 3 (three) times daily before meals. 0-150=0; 151-200=2u; 201-250=3u; 251-300=4u; 301-350=5u; over 350 =6u  prior to meals     insulin glargine 100 UNIT/ML injection  Commonly known as:  LANTUS  Inject 16 Units into the skin at bedtime.     lipase/protease/amylase 12000 UNITS Cpep capsule  Commonly known as:  CREON-12/PANCREASE  Take 4 capsules by mouth 3 (three) times daily.     Melatonin 3 MG Tabs  Take 3 mg by mouth at bedtime.     metoprolol tartrate 12.5 mg Tabs tablet  Commonly known as:  LOPRESSOR  Take 0.5 tablets (12.5 mg total) by mouth daily.     multivitamins ther. w/minerals Tabs tablet  Take 1 tablet by mouth every morning.     nitroGLYCERIN 0.4 MG SL tablet  Commonly known as:  NITROSTAT  Place 0.4 mg under the tongue every 5 (five) minutes as needed for chest pain.     oxybutynin 5 MG tablet  Commonly known as:  DITROPAN   Take 5 mg by mouth at bedtime.     simvastatin 40 MG tablet  Commonly known as:  ZOCOR  Take 40 mg by mouth at bedtime.       Allergies  Allergen Reactions  . Albuterol Sulfate Hfa [Kdc:Albuterol] Shortness Of Breath and Swelling  . Adhesive [Tape] Other (See Comments)    REACTION: SKIN BLISTERS  . Albuterol Swelling    Per pt., side of his neck began to swell with nebulized Albuterol in doctor's office.  . Contrast Media [Iodinated Diagnostic Agents] Other (See Comments)    Reaction unknown  . Lactose     Other reaction(s): GI Upset (intolerance)  . Metrizamide Other (See Comments)    Reaction unknown  . Penicillins Other (See Comments) and Itching    REACTION: ITCHING HANDS  . Sulfa Drugs Cross Reactors Hives  . Sulfamethoxazole Rash      The results of significant diagnostics from this hospitalization (including imaging, microbiology, ancillary and laboratory) are listed below for reference.    Significant Diagnostic Studies: Dg Chest 2 View  12/29/2013   CLINICAL DATA:  80 year old male shortness of breath and back pain. Initial encounter.  EXAM: CHEST  2 VIEW  COMPARISON:  12/27/2013 and earlier.  FINDINGS: Stable lung volumes. Stable cardiac size and mediastinal contours. Small to moderate bilateral pleural effusions, dependent. No pneumothorax. No pulmonary edema. No superimposed confluent pulmonary opacity. Osteopenia. Spinal compression fractures, some previously augmented. Chronic proximal right humerus deformity. Subacute to chronic left lateral rib fractures.  IMPRESSION: Small to moderate layering pleural effusions. No other acute cardiopulmonary abnormality identified.   Electronically Signed   By: Lars Pinks M.D.   On: 12/29/2013 11:38   Dg Chest 2 View  12/27/2013   CLINICAL DATA:  Fall, right-sided chest pain  EXAM: CHEST  2 VIEW  COMPARISON:  Prior chest x-ray and right rib series 1 09/2013  FINDINGS: Low inspiratory volumes. Nodular opacities in the left mid  lung are again noted. Stable cardiac and mediastinal contours. Background bronchitic changes and interstitial prominence grossly similar compared to prior. No pneumothorax or large effusion. Pulmonary vascular congestion without overt edema. No definite acute fracture. Remote healed right proximal humeral fracture. Osteoarthritis of the bilateral acromioclavicular joints. No acute osseous abnormality. The bones are diffusely osteopenic.  IMPRESSION: Low inspiratory volumes with bibasilar atelectasis and mild vascular congestion.  Otherwise, stable chest x-ray.   Electronically Signed   By: Jacqulynn Cadet M.D.   On: 12/27/2013 23:20   Dg Thoracic Spine 2 View  12/30/2013   CLINICAL DATA:  Vertebral body fractures.  Back pain.  EXAM: THORACIC SPINE - 2 VIEW  COMPARISON:  CT 12/27/2013.  FINDINGS: Diffuse degenerative changes and osteopenia with multiple thoracic vertebral bodies stable compression fractures noted. Previously questioned slight increase T11 and T12 compression fracture appears stable on thoracic spine series.  IMPRESSION: Diffuse osteopenia and degenerative change with stable thoracic spine compression fractures. Previously questioned T11 and T12 mild increase compression on lumbar spine series obtained today appears stable on the thoracic spine series. The mild compression may have been accentuated by the projection on the lumbar spine series.   Electronically Signed   By: Marcello Moores  Register   On: 12/30/2013 12:02   Dg Lumbar Spine 2-3 Views  12/30/2013   CLINICAL DATA:  Vertebral fractures.  EXAM: LUMBAR SPINE - 2-3 VIEW  COMPARISON:  CT L SPINE W/O CM dated 12/27/2013  FINDINGS: Diffuse severe lumbar spine degenerative change and osteopenia noted. Patient has had prior vertebroplasties L1 and L3. Mild compression at L4 is noted, this has progressed slightly from prior exam. Mild compression at T11 and T12 have progressed slightly from prior study. Prominent scoliosis concave left unchanged .  Surgical clips within the right upper quadrant and pelvis.  IMPRESSION: 1. Mild increase compression of T11, T12 and L4. 2. Diffuse degenerative change with severe osteopenia and with multiple compressions again noted. Prior vertebroplasty L1 and L3.   Electronically Signed   By: Marcello Moores  Register   On: 12/30/2013 11:02   Dg Hip Complete Right  12/27/2013   CLINICAL DATA:  Fall, right hip pain  EXAM: RIGHT HIP - COMPLETE 2+ VIEW  COMPARISON:  Prior radiographs of the pelvis and right hip 06/24/2013  FINDINGS: The bones are diffusely osteopenic. This limits evaluation for nondisplaced fractures considerably. No definite fracture identified. Prior lower lumbar compression fractures status post vertebroplasty. Numerous surgical clips project over the anatomic pelvis. Scattered atherosclerotic vascular calcifications. Calcifications of the bilateral vas deferens noted incidentally. Osseous excrescence arising from the lateral aspect of the left ilium a similar compared to prior.  IMPRESSION: No acute fracture identified although marked background osteopenia limits evaluation for nondisplaced fractures.   Electronically Signed   By: Jacqulynn Cadet M.D.   On: 12/27/2013 23:17   Ct Head Wo Contrast  12/27/2013   CLINICAL DATA:  Fall, headache and neck pain, back pain.  EXAM: CT HEAD WITHOUT CONTRAST  CT CERVICAL SPINE WITHOUT CONTRAST  TECHNIQUE: Multidetector CT imaging of the head and cervical spine was performed following the standard protocol without intravenous contrast. Multiplanar CT image reconstructions of the cervical spine were also generated.  COMPARISON:  CT HEAD W/O CM dated 03/21/2013; DG CERVICAL SPINE 2-3 VIEWS dated 10/07/2013  FINDINGS: CT HEAD FINDINGS  The ventricles and sulci are normal for age. No intraparenchymal hemorrhage, mass effect nor midline shift. Confluent supratentorial white matter hypodensities are within normal range for patient's age and though non-specific suggest sequelae of  chronic small vessel ischemic disease. No acute large vascular territory infarcts. Multiple tiny T2 hyperintensities within the basal ganglia and thalamus likely reflect remote lacunar infarcts.  No abnormal extra-axial fluid collections. Basal cisterns are patent. Moderate calcific atherosclerosis of the carotid siphons and included vertebral arteries.  No skull fracture. Status post bilateral ocular lens implants. Right maxillary mucosal thickening and bony wall changes consistent chronic sinusitis. Mild ethmoid mucosal thickening. Mastoid air cells are well aerated. Severe bilateral temporomandibular osteoarthrosis. Status post bilateral ocular lens implants.  CT CERVICAL SPINE FINDINGS  Cervical vertebral bodies and posterior elements appear intact with accentuated  cervical lordosis. The C3-4 facets are fused on degenerative basis. C1-2 articulation maintained with severe arthropathy. Calcified pannus about the odontoid process likely reflects CPPD. Severe C3-4 thru C5-6 degenerative disc disease. Bone mineral density is decreased without destructive bony lesions. Severe calcific atherosclerosis of the left carotid bulb, if clinical concern for hemodynamically significant stenosis, carotid ultrasound or CT angiogram would be more sensitive. Included view of the lung apices demonstrate calcified granulomas.  Degenerative disc disease and facet arthropathy result in mild canal stenosis C4-5 and C5-6. Moderate C4-5 and C5-6 neural foraminal narrowing.  IMPRESSION: CT head:  No acute intracranial process.  Involutional changes. Moderate to severe white matter changes suggest chronic small vessel ischemic disease with bilateral suspected basal ganglia and thalamus remote lacunar infarcts.  CT cervical spine: Accentuated cervical lordosis without acute fracture nor malalignment.   Electronically Signed   By: Elon Alas   On: 12/27/2013 23:33   Ct Cervical Spine Wo Contrast  12/27/2013   CLINICAL DATA:  Fall,  headache and neck pain, back pain.  EXAM: CT HEAD WITHOUT CONTRAST  CT CERVICAL SPINE WITHOUT CONTRAST  TECHNIQUE: Multidetector CT imaging of the head and cervical spine was performed following the standard protocol without intravenous contrast. Multiplanar CT image reconstructions of the cervical spine were also generated.  COMPARISON:  CT HEAD W/O CM dated 03/21/2013; DG CERVICAL SPINE 2-3 VIEWS dated 10/07/2013  FINDINGS: CT HEAD FINDINGS  The ventricles and sulci are normal for age. No intraparenchymal hemorrhage, mass effect nor midline shift. Confluent supratentorial white matter hypodensities are within normal range for patient's age and though non-specific suggest sequelae of chronic small vessel ischemic disease. No acute large vascular territory infarcts. Multiple tiny T2 hyperintensities within the basal ganglia and thalamus likely reflect remote lacunar infarcts.  No abnormal extra-axial fluid collections. Basal cisterns are patent. Moderate calcific atherosclerosis of the carotid siphons and included vertebral arteries.  No skull fracture. Status post bilateral ocular lens implants. Right maxillary mucosal thickening and bony wall changes consistent chronic sinusitis. Mild ethmoid mucosal thickening. Mastoid air cells are well aerated. Severe bilateral temporomandibular osteoarthrosis. Status post bilateral ocular lens implants.  CT CERVICAL SPINE FINDINGS  Cervical vertebral bodies and posterior elements appear intact with accentuated cervical lordosis. The C3-4 facets are fused on degenerative basis. C1-2 articulation maintained with severe arthropathy. Calcified pannus about the odontoid process likely reflects CPPD. Severe C3-4 thru C5-6 degenerative disc disease. Bone mineral density is decreased without destructive bony lesions. Severe calcific atherosclerosis of the left carotid bulb, if clinical concern for hemodynamically significant stenosis, carotid ultrasound or CT angiogram would be more  sensitive. Included view of the lung apices demonstrate calcified granulomas.  Degenerative disc disease and facet arthropathy result in mild canal stenosis C4-5 and C5-6. Moderate C4-5 and C5-6 neural foraminal narrowing.  IMPRESSION: CT head:  No acute intracranial process.  Involutional changes. Moderate to severe white matter changes suggest chronic small vessel ischemic disease with bilateral suspected basal ganglia and thalamus remote lacunar infarcts.  CT cervical spine: Accentuated cervical lordosis without acute fracture nor malalignment.   Electronically Signed   By: Elon Alas   On: 12/27/2013 23:33   Ct Thoracic Spine Wo Contrast  12/27/2013   CLINICAL DATA:  Fall, back pain.  Status post L1 kyphoplasty.  EXAM: CT THORACIC AND LUMBAR SPINE WITHOUT CONTRAST  TECHNIQUE: Multidetector CT imaging of the thoracic and lumbar spine was performed without contrast. Multiplanar CT image reconstructions were also generated.  COMPARISON:  DG CHEST 2 VIEW  dated 12/27/2013; IR KYPHO VERTEBRAL LUMBAR AUGMENTATION dated 10/12/2013; CT L SPINE W/O CM dated 10/07/2013  FINDINGS: CT THORACIC SPINE FINDINGS  Severe at T2 and T3 burst fractures, which were present on prior chest radiographs, with T2 vertebra plana ; at least 3 mm of retropulsed bony fragments at T2 and T3 without canal stenosis. At least 75% T3 vertebral body height loss. Very mild T4 compression fracture, mild T8 compression fracture with less than 25% height loss. Very mild T12 suspected compression fracture. No malalignment. Accentuated upper thoracic kyphosis.  Severe osteopenia. No destructive bony lesions. Intervertebral disc heights generally preserved with upper thoracic ventral endplate spurring. Apical calcified granulomas. Included prevertebral and paraspinal soft tissues are nonsuspicious.  CT LUMBAR SPINE FINDINGS  Transitional anatomy with sacralized L5 vertebral body. Severe osteopenia. Moderate to severe L1 burst fracture status post  vertebral body cement augmentation. Healing right L1 transverse process fracture. Moderate L3 vertebral body fracture and cement augmentation. Right L3 nondisplaced transverse process fracture appears new from prior imaging. Grade 1 L4-5 anterolisthesis on degenerative basis. Lumbar lordosis maintained. Severe osteopenia without destructive bony lesions.  Moderate calcific atherosclerosis of the aorta. Moderate symmetric paraspinal muscle atrophy. Retroperitoneal surgical clips. Status postcholecystectomy. Right upper pole 3.4 cm renal cyst.  Degenerative disc disease and facet arthropathy result in mild canal stenosis L2-3, moderate at L3-4 and moderate to severe L4-5. Moderate to severe right L3-4, moderate right L4-5 neural foraminal narrowing. Severe left L1-2, L3-4 and moderate left L4-5 neural foraminal narrowing.  IMPRESSION: CT THORACIC SPINE IMPRESSION  Severe osteopenia. Multiple thoracic compression fractures including severe T2 and T3 burst fractures which appear chronic, without canal stenosis, or malalignment. Accentuated upper thoracic kyphosis.  CT LUMBAR SPINE IMPRESSION  Severe osteopenia. Nondisplaced right L3 transverse process fracture may be acute.  Remote L1 and L3 compression fractures, status post vertebroplasty/ cement augmentation at these levels. Grade 1 L4-5 anterolisthesis on degenerative basis.   Electronically Signed   By: Elon Alas   On: 12/27/2013 23:53   Ct Lumbar Spine Wo Contrast  12/27/2013   CLINICAL DATA:  Fall, back pain.  Status post L1 kyphoplasty.  EXAM: CT THORACIC AND LUMBAR SPINE WITHOUT CONTRAST  TECHNIQUE: Multidetector CT imaging of the thoracic and lumbar spine was performed without contrast. Multiplanar CT image reconstructions were also generated.  COMPARISON:  No comparisons  FINDINGS: CT THORACIC SPINE FINDINGS  Severe at T2 and T3 burst fractures, which were present on prior chest radiographs, with T2 vertebra plana ; at least 3 mm of retropulsed  bony fragments at T2 and T3 without canal stenosis. At least 75% T3 vertebral body height loss. Very mild T4 compression fracture, mild T8 compression fracture with less than 25% height loss. Very mild T12 suspected compression fracture. No malalignment. Accentuated upper thoracic kyphosis.  Severe osteopenia. No destructive bony lesions. Intervertebral disc heights generally preserved with upper thoracic ventral endplate spurring. Apical calcified granulomas. Included prevertebral and paraspinal soft tissues are nonsuspicious.  CT LUMBAR SPINE FINDINGS  Transitional anatomy with sacralized L5 vertebral body. Severe osteopenia. Moderate to severe L1 burst fracture status post vertebral body cement augmentation. Healing right L1 transverse process fracture. Moderate L3 vertebral body fracture and cement augmentation. Right L3 nondisplaced transverse process fracture appears new from prior imaging. Grade 1 L4-5 anterolisthesis on degenerative basis. Lumbar lordosis maintained. Severe osteopenia without destructive bony lesions.  Moderate calcific atherosclerosis of the aorta. Moderate symmetric paraspinal muscle atrophy. Retroperitoneal surgical clips. Status postcholecystectomy. Right upper pole 3.4 cm renal cyst.  Degenerative disc disease and facet arthropathy result in mild canal stenosis L2-3, moderate at L3-4 and moderate to severe L4-5. Moderate to severe right L3-4, moderate right L4-5 neural foraminal narrowing. Severe left L1-2, L3-4 and moderate left L4-5 neural foraminal narrowing.  IMPRESSION: CT THORACIC SPINE IMPRESSION  Severe osteopenia. Multiple thoracic compression fractures including severe T2 and T3 burst fractures which appear chronic, without canal stenosis, or malalignment. Accentuated upper thoracic kyphosis.  CT LUMBAR SPINE IMPRESSION  Severe osteopenia. Nondisplaced right L3 transverse process fracture may be acute.  Remote L1 and L3 compression fractures, status post vertebroplasty/ cement  augmentation at these levels. Grade 1 L4-5 anterolisthesis on degenerative basis.   Electronically Signed   By: Elon Alas   On: 12/27/2013 23:51   Mr Lumbar Spine Wo Contrast  12/30/2013   CLINICAL DATA:  Worsening back pain. Prior L1 and L3 vertebra rolled compression fracture status post kyphoplasty.  EXAM: MRI LUMBAR SPINE WITHOUT CONTRAST  TECHNIQUE: Multiplanar, multisequence MR imaging was performed. No intravenous contrast was administered.  COMPARISON:  Lumbar spine radiographs 12/30/2013. Lumbar and thoracic CT 12/27/2013. Lumbar spine MRI 11/19/2012.  FINDINGS: Again noted is transitional lumbosacral anatomy with partial sacralization of L5.  Again seen are moderate L1 and L3 compression fracture status post cement augmentation. There is minimal depression of the superior T12 endplate without frank compression fracture, and there is no marrow edema at this level. Thoracolumbar dextroscoliosis is noted. There is slight anterolisthesis of L4 on L5, unchanged. The conus medullaris is normal in signal and terminates at T12-L1. Multiple renal cysts are partially visualized bilaterally. There are small bilateral pleural effusions.  T9-10 through T11-12: No disc herniation or stenosis.  T12-L1: Minimal disc bulging and slight retropulsion of the L1 superior endplate. No spinal stenosis. Minimal left neural foraminal narrowing.  L1-2: Mild disc bulging and facet and ligamentum flavum hypertrophy result in mild to moderate left neural foraminal stenosis. No spinal canal stenosis.  L2-3: Minimal disc bulge and mild facet and ligamentum flavum hypertrophy result in mild left neural foraminal narrowing. No spinal canal stenosis.  L3-4: Disc bulge and moderate facet and ligamentum flavum hypertrophy result in moderate bilateral lateral recess stenosis and moderate right and severe left neural foraminal stenosis, unchanged from prior MRI.  L4-5: Disc bulge and advanced facet and ligamentum flavum hypertrophy  result in mild spinal stenosis, right greater than left lateral recess stenosis, and moderate to severe right and mild left neural foraminal stenosis, unchanged.  L5-S1:  Negative.  IMPRESSION: 1. Unchanged L1 and L3 compression fractures status post cement augmentation. No acute compression fracture. 2. Scoliosis with multilevel degenerative disc disease and facet arthrosis. Stenosis is greatest at L3-4 and L4-5 as above without significant interval change from prior MRI.   Electronically Signed   By: Logan Bores   On: 12/30/2013 18:45   Nm Bone Scan Whole Body  12/31/2013   CLINICAL DATA:  Metastatic prostate cancer to the lung. Back pain and sacral pain.  EXAM: NUCLEAR MEDICINE WHOLE BODY BONE SCAN  TECHNIQUE: Whole body anterior and posterior images were obtained approximately 3 hours after intravenous injection of radiopharmaceutical.  RADIOPHARMACEUTICALS:  25.5 mCi of Technetium-99 MDP  COMPARISON:  Bone scans dated 11/17/2012 and 05/19/2008 and CT scans of the spine dated 12/27/2013  FINDINGS: There is increased activity in the upper thoracic spine and in the upper lumbar spine as well as several left posterior ribs. All of these areas of activity correlate with benign appearing fractures in the spine and  in the left ribs. None appear acute.  There is an area of vertically increased activity in the right side of the sacrum consistent with an insufficiency fracture.  IMPRESSION: Multiple areas of abnormal activity in the spine and left ribs and in the sacrum which are new since the prior study. These these are all felt to represent benign fractures. The spine and rib fractures appear to be relatively old.   Electronically Signed   By: Rozetta Nunnery M.D.   On: 12/31/2013 14:43    Microbiology: Recent Results (from the past 240 hour(s))  URINE CULTURE     Status: None   Collection Time    12/27/13 10:21 PM      Result Value Ref Range Status   Specimen Description URINE, RANDOM   Final   Special  Requests NONE   Final   Culture  Setup Time     Final   Value: 12/28/2013 05:58     Performed at [facility]     Final   Value: >=100,000 COLONIES/ML     Performed at [facility]   Culture     Final   Value: PROTEUS MIRABILIS     Performed at [facility]   Report Status 12/30/2013 FINAL   Final   Organism ID, Bacteria PROTEUS MIRABILIS   Final  MRSA PCR SCREENING     Status: Abnormal   Collection Time    12/29/13 10:23 PM      Result Value Ref Range Status   MRSA by PCR POSITIVE (*) NEGATIVE Final   Comment:            The GeneXpert MRSA Assay (FDA     approved for NASAL specimens     only), is one component of a     comprehensive MRSA colonization     surveillance program. It is not     intended to diagnose MRSA     infection nor to guide or     monitor treatment for     MRSA infections.     RESULT CALLED TO, READ BACK BY AND VERIFIED WITH:     TINER,A/4W @2340  ON 12/29/13 BY KARCZEWSKI,S.     Labs: Basic Metabolic Panel:  Recent Labs Lab 12/29/13 1120 12/30/13 0345 12/31/13 0400 01/01/14 0420 01/02/14 0358  NA 137 139 137 139 138  K 3.9 3.7 3.7 4.0 4.2  CL 99 103 102 101 101  CO2 23 26 23 26 29   GLUCOSE 223* 141* 146* 119* 93  BUN 24* 25* 23 24* 25*  CREATININE 1.43* 1.28 1.14 1.23 1.31  CALCIUM 9.5 8.8 9.0 9.1 9.5   Liver Function Tests:  Recent Labs Lab 12/30/13 0345 12/31/13 0400 01/01/14 0420 01/02/14 0358  AST 120* 89* 112* 103*  ALT 64* 59* 66* 64*  ALKPHOS 343* 334* 448* 525*  BILITOT 1.6* 0.9 1.0 0.9  PROT 6.9 7.5 7.1 7.0  ALBUMIN 2.7* 2.8* 2.6* 2.6*   No results found for this basename: LIPASE, AMYLASE,  in the last 168 hours No results found for this basename: AMMONIA,  in the last 168 hours CBC:  Recent Labs Lab 12/29/13 1120 12/30/13 0345 12/31/13 0400 01/01/14 0420 01/02/14 0358 01/04/14 0524  WBC 9.2 8.1 9.3 7.2 7.1 9.6  NEUTROABS 6.9  --   --   --   --   --   HGB 13.1 12.6* 13.6  12.2* 12.4* 13.0  HCT 38.8* 37.9* 40.4 36.7* 37.1* 38.9*  MCV 95.8 96.9 96.0 96.8 95.9 95.1  PLT 110* 123* 116* 133* 139* 190   Cardiac Enzymes:  Recent Labs Lab 12/30/13 1057 12/30/13 1521 12/30/13 2145  TROPONINI 0.32* <0.30 <0.30   BNP: BNP (last 3 results)  Recent Labs  12/29/13 1120  PROBNP 2026.0*   CBG:  Recent Labs Lab 01/03/14 0733 01/03/14 1141 01/03/14 1731 01/03/14 2209 01/04/14 0758  GLUCAP 95 180* 145* 180* 129*       Signed:  Nita Sells  [facility] 01/04/2014, 5:42 PM

## 2014-01-04 NOTE — Procedures (Signed)
S/P sacroplasty  Via bilateral approach.

## 2014-01-04 NOTE — Progress Notes (Signed)
PT Cancellation Note  Patient Details Name: BREVYN RING MRN: [id number] DOB: [birth year]   Cancelled Treatment:    Reason Eval/Treat Not Completed: Patient at procedure or test/unavailable--pt currently in [facility]. Will check back another time.    Weston Anna, MPT Pager: [phone]

## 2014-01-04 NOTE — Progress Notes (Signed)
CSW received call from RN, Deidre Ala that patient's wife was asking to speak with CSW for discharge planning. CSW confirmed with RNCM, Judson Roch that patient's stay is still classified as "observation" status, therefore - does not have a qualifying 3-night inpatient hospital stay in which Medicare would cover SNF stay. Per wife, patient had been to [facility] (1/13 - 11/13/13), but in order for Medicare to continue to cover SNF stay, he would have had to readmitted back to SNF within 30 days of discharge from SNF.   RNCM, Judson Roch & wife aware. Wife informed CSW that patient was getting home health through [place] [facility].   Raynaldo Opitz, [facility] Clinical Social Worker cell #: [phone]

## 2014-01-04 NOTE — Sedation Documentation (Signed)
Sacral allevyn changed, skin reddened under dressing

## 2014-01-05 DIAGNOSIS — M8448XA Pathological fracture, other site, initial encounter for fracture: Secondary | ICD-10-CM | POA: Diagnosis not present

## 2014-01-05 LAB — GLUCOSE, CAPILLARY
GLUCOSE-CAPILLARY: 97 mg/dL (ref 70–99)
GLUCOSE-CAPILLARY: 90 mg/dL (ref 70–99)
Glucose-Capillary: 136 mg/dL — ABNORMAL HIGH (ref 70–99)

## 2014-01-05 MED ORDER — HYDROCODONE-ACETAMINOPHEN 5-325 MG PO TABS: 1.0000 | ORAL_TABLET | Freq: Four times a day (QID) | ORAL | Status: DC | PRN

## 2014-01-05 NOTE — Evaluation (Signed)
Physical Therapy Evaluation Patient Details Name: Guy [name] MRN: [id number] DOB: April 05, 1920 Today's Date: 01/05/2014   History of Present Illness  80 yo male admitted with back pain, s/p sacroplasty 4/7. Pt is HOH.   Clinical Impression  On eval, pt required Min assist for mobility-able to ambulate ~100 feet with walker. No family present to provide PLOF/home environment info. Recommend HHPT as long as family is able to provide care.     Follow Up Recommendations Home health PT;Supervision/Assistance - 24 hour (as long as family can safely provide current level of care. Otherwise may need SNF.)    Equipment Recommendations   (may need walker if pt doesn't have one already)    Recommendations for Other Services OT consult     Precautions / Restrictions Precautions Precautions: Fall Restrictions Weight Bearing Restrictions: No      Mobility  Bed Mobility Overal bed mobility: Needs Assistance Bed Mobility: Supine to Sit     Supine to sit: Min assist     General bed mobility comments: Assist to get to EOB. Increased time.   Transfers Overall transfer level: Needs assistance Equipment used: Rolling walker (2 wheeled) Transfers: Sit to/from Stand Sit to Stand: Min assist         General transfer comment: Assist to rise, stabilize, control descent. Multimodal cues for safety, technique, hand placement  Ambulation/Gait Ambulation/Gait assistance: Min assist Ambulation Distance (Feet): 100 Feet Assistive device: Rolling walker (2 wheeled) Gait Pattern/deviations: Step-through pattern;Trunk flexed     General Gait Details: slow gait speed. Assist to stabilize and maneuver with walker. Multimodal cues for safety, distance from RW. Pt stated that he "can't walk far".   Stairs            Wheelchair Mobility    Modified Rankin (Stroke Patients Only)       Balance                                             Pertinent Vitals/Pain Pt  did not c/o pain    Home Living Family/patient expects to be discharged to:: Unsure Living Arrangements: Spouse/significant other               Additional Comments: unableto get history/PLOF-pt is very [facility]!!! No family present    Prior Function                 Hand Dominance        Extremity/Trunk Assessment   Upper Extremity Assessment: Generalized weakness           Lower Extremity Assessment: Generalized weakness      Cervical / Trunk Assessment: Kyphotic  Communication   Communication: HOH  Cognition Arousal/Alertness: Awake/alert Behavior During Therapy: WFL for tasks assessed/performed Overall Cognitive Status: Difficult to assess                      General Comments      Exercises        Assessment/Plan    PT Assessment Patient needs continued PT services  PT Diagnosis Difficulty walking;Generalized weakness   PT Problem List Decreased strength;Decreased activity tolerance;Decreased balance;Decreased mobility;Decreased knowledge of use of DME  PT Treatment Interventions DME instruction;Gait training;Functional mobility training;Therapeutic activities;Therapeutic exercise;Patient/family education;Balance training   PT Goals (Current goals can be found in the Care Plan section) Acute Rehab PT Goals Patient Stated Goal: none  stated. PT Goal Formulation: Patient unable to participate in goal setting Time For Goal Achievement: 01/19/14 Potential to Achieve Goals: Good    Frequency Min 3X/week   Barriers to discharge        Co-evaluation               End of Session Equipment Utilized During Treatment: Gait belt Activity Tolerance: Patient tolerated treatment well Patient left: in chair;with call bell/phone within reach;with chair alarm set      Functional Assessment Tool Used: clinical judgement Functional Limitation: Mobility: Walking and moving around Mobility: Walking and Moving Around Current Status [phone]): At  least 20 percent but less than 40 percent impaired, limited or restricted Mobility: Walking and Moving Around Goal Status [phone]): At least 1 percent but less than 20 percent impaired, limited or restricted    Time: 0917-0938 PT Time Calculation (min): 21 min   Charges:   PT Evaluation $Initial PT Evaluation Tier I: 1 Procedure PT Treatments $Gait Training: 8-22 mins   PT G Codes:   Functional Assessment Tool Used: clinical judgement Functional Limitation: Mobility: Walking and moving around    [employer], MPT Pager: [phone]

## 2014-01-05 NOTE — Evaluation (Signed)
Occupational Therapy Evaluation Patient Details Name: GAVIN TELFORD MRN: [id number] DOB: [birth year] Today's Date: 01/05/2014    History of Present Illness 80 yo male admitted with back pain, s/p sacroplasty 4/7. Pt is HOH.    Clinical Impression   Pt was admitted for the above condition and procedure.  He has a h/o falls and has had VP and KP in back previously.  Pt is being discharged home today; he does not qualify for SNF.  Recommend HHOT to work on safety and balance during adls    Follow Up Recommendations  Home health OT (for balance during adls/safety)    Equipment Recommendations  None recommended by OT    Recommendations for Other Services       Precautions / Restrictions Precautions Precautions: Fall Restrictions Weight Bearing Restrictions: No      Mobility Bed Mobility Overal bed mobility: Needs Assistance Bed Mobility: Supine to Sit     Supine to sit: Min assist     General bed mobility comments: Assist to get to EOB. Increased time.   Transfers Overall transfer level: Needs assistance Equipment used: Rolling walker (2 wheeled) Transfers: Sit to/from Stand Sit to Stand: Min assist         General transfer comment: Assist to rise, stabilize, control descent. Multimodal cues for safety, technique, hand placement    Balance Overall balance assessment: History of Falls;Needs assistance Sitting-balance support: Single extremity supported Sitting balance-Leahy Scale: Poor                                      ADL Overall ADL's : At baseline has [place] aide 5x week.  He needs min A to ambulate to bathroom                                       General ADL Comments: Pt needs assist for adls:  UB mod A; LB total A.  Ambulated to bathroom with min A--cues and assistance to keep walker close to body.     Vision                     Perception     Praxis      Pertinent Vitals/Pain Back pain when  standing/walking:  Rated 15/10.  Repositioned.      Hand Dominance     Extremity/Trunk Assessment Upper Extremity Assessment Upper Extremity Assessment: RUE deficits/detail RUE Deficits / Details: difficulty lifting RUE above 30.  Bil UEs have generalized weakness and pt's palm arches are flattened.  Wife has ordered some grips to place over RW handles for protection   Lower Extremity Assessment Lower Extremity Assessment: Generalized weakness   Cervical / Trunk Assessment Cervical / Trunk Assessment: Kyphotic   Communication Communication Communication: HOH   Cognition Arousal/Alertness: Awake/alert Behavior During Therapy: WFL for tasks assessed/performed Overall Cognitive Status: Difficult to assess                     General Comments       Exercises       Shoulder Instructions      Home Living Family/patient expects to be discharged to:: Private residence Living Arrangements: Spouse/significant other                 Bathroom Shower/Tub: Hospital doctor  Toilet: Standard     Home Equipment: Walker - 2 wheels;Shower seat;Bedside commode   Additional Comments: wife present during evaluation.  They have DME.  She wants SNF but states pt doesn't qualify      Prior Functioning/Environment Level of Independence: Needs assistance        Comments: they have [place] aide through [place] 5x week for showering.  Wife walks with pt to bathroom.  He keeps walker out too far in front of him and she tries to cue him. She was not with him when he last fell    OT Diagnosis: Generalized weakness   OT Problem List: Decreased strength;Decreased activity tolerance;Impaired balance (sitting and/or standing);Decreased cognition;Decreased safety awareness;Pain   OT Treatment/Interventions:      OT Goals(Current goals can be found in the care plan section) Acute Rehab OT Goals Patient Stated Goal: none stated  OT Frequency:     Barriers to D/C:             Co-evaluation              End of Session    Activity Tolerance: Patient tolerated treatment well Patient left: in chair;with call bell/phone within reach;with chair alarm set;with family/visitor present   Time: 1130-1153 OT Time Calculation (min): 23 min Charges:  OT General Charges $OT Visit: 1 Procedure OT Evaluation $Initial OT Evaluation Tier I: 1 Procedure OT Treatments $Self Care/Home Management : 8-22 mins G-Codes: OT G-codes **NOT FOR INPATIENT CLASS** Functional Assessment Tool Used: clinical observation Functional Limitation: Self care Self Care Current Status (A2505): At least 80 percent but less than 100 percent impaired, limited or restricted Self Care Goal Status (L9767): At least 80 percent but less than 100 percent impaired, limited or restricted Self Care Discharge Status [phone]): At least 80 percent but less than 100 percent impaired, limited or restricted  [facility] 01/05/2014, 12:36 PM   Lesle Chris, OTR/L [phone] 01/05/2014

## 2014-01-28 ENCOUNTER — Telehealth: Payer: Self-pay | Admitting: *Deleted

## 2014-01-28 ENCOUNTER — Encounter: Payer: Self-pay | Admitting: Cardiology

## 2014-01-28 ENCOUNTER — Ambulatory Visit (INDEPENDENT_AMBULATORY_CARE_PROVIDER_SITE_OTHER): Payer: Medicare Other | Admitting: Cardiology

## 2014-01-28 VITALS — BP 150/80 | HR 58 | Ht 66.0 in | Wt 127.0 lb

## 2014-01-28 DIAGNOSIS — R079 Chest pain, unspecified: Secondary | ICD-10-CM

## 2014-01-28 DIAGNOSIS — I2510 Atherosclerotic heart disease of native coronary artery without angina pectoris: Secondary | ICD-10-CM

## 2014-01-28 NOTE — Patient Instructions (Signed)
Your physician recommends that you schedule a follow-up appointment  After your test is completed and we will call you with the results  We will schedule you back on a day to see one of the Np or PA 's on a day Dr. Gwenlyn Found is  In the office

## 2014-01-28 NOTE — Assessment & Plan Note (Signed)
Symptoms are concerning for angina, left sided chest pressure, occuring at rest and relieved with NTG. Given his age, will first assess with a NST to see if symptoms are in fact possibly cardiac related. I have discussed case with Dr. Claiborne Billings, DOD, and he feels that he is safe to undergo nuclear stress testing.

## 2014-01-28 NOTE — Progress Notes (Signed)
Patient ID: Guy [name], male   DOB: [birth year], 80 y.o.   MRN: [id number]     01/28/2014 Guy [name]   05/05/1920  [id number]  Primary Physicia ALTHEIMER,MICHAEL D, MD Primary Cardiologist: Dr. Gwenlyn Found  HPI:  Mr. Guy [name] presents to clinic today for surgical clearance. He wants to undergo a dental procedure that will require full anesthesia. The procedure is for dental implants to allow for placement of dentures. He is not a candidate for traditional dentures. Without this procedure, he will continue to be on a very limited diet. His cardiac history is outlined below.   Mr.  Guy [name] is a 80 y/o male followed by Dr. Gwenlyn Found.  He has a history of CAD, status post remote LAD stenting in 1996 in [place]. Dr. Gwenlyn Found catheterized him November 27, 2001 revealing a patent LAD stent with mild in-stent restenosis. His last Myoview performed in March of 2008 was nonischemic, as was one on June 12, 2009. His other problems include hypertension, hyperlipidemia, and non-insulin-requiring diabetes. He does have metastatic prostate cancer involving his lungs and ribs and has had multiple operations for these. He gets frequent chest pain at night while sleeping. This is left sided pain. Pressure like. He tried SL NTG once and his pain resolved. He has no other symptoms, denying SOB, diaphoresis, n/v. He also states that he recently fell several months ago and broke several ribs on the left side. He is unsure if the pain is related to this, but feels that it may possibly be cardiac pain, as it was relieved quickly with NTG. His pain is not pleuritic and not reproducible to palpation.     Current Outpatient Prescriptions  Medication Sig Dispense Refill  . amLODipine (NORVASC) 10 MG tablet Take 10 mg by mouth daily.      . bicalutamide (CASODEX) 50 MG tablet Take 50 mg by mouth daily.      . Calcium Carbonate-Vitamin D (CALCIUM 600+D HIGH POTENCY) 600-400 MG-UNIT per tablet Take 1 tablet by mouth 2 (two) times  daily.        . carboxymethylcellulose (REFRESH PLUS) 0.5 % SOLN Place 1 drop into both eyes 2 (two) times daily as needed (for dry eyes).      [name] docusate sodium (COLACE) 100 MG capsule Take 1 capsule (100 mg total) by mouth every 12 (twelve) hours.  60 capsule  0  . fentaNYL (DURAGESIC - DOSED MCG/HR) 25 MCG/HR patch Place 25 mcg onto the skin every 3 (three) days.      . fluticasone (FLONASE) 50 MCG/ACT nasal spray Place 2 sprays into the nose 2 (two) times daily.      [name] glimepiride (AMARYL) 4 MG tablet Take 4 mg by mouth daily before breakfast.      . HYDROcodone-acetaminophen (NORCO/VICODIN) 5-325 MG per tablet Take 1 tablet by mouth every 6 (six) hours as needed.  30 tablet  0  . hydrOXYzine (ATARAX/VISTARIL) 25 MG tablet Take 25 mg by mouth 2 (two) times daily.       . insulin aspart (NOVOLOG) 100 UNIT/ML injection Inject 0-6 Units into the skin 3 (three) times daily before meals. 0-150=0; 151-200=2u; 201-250=3u; 251-300=4u; 301-350=5u; over 350 =6u prior to meals      . insulin glargine (LANTUS) 100 UNIT/ML injection Inject 16 Units into the skin at bedtime.       . lipase/protease/amylase (CREON-12/PANCREASE) 12000 UNITS CPEP Take 4 capsules by mouth 3 (three) times daily.      . Melatonin 3 MG TABS  Take 3 mg by mouth at bedtime.      . metoprolol tartrate (LOPRESSOR) 12.5 mg TABS tablet Take 0.5 tablets (12.5 mg total) by mouth daily.  30 tablet  0  . Multiple Vitamins-Minerals (MULTIVITAMINS THER. W/MINERALS) TABS Take 1 tablet by mouth every morning.       . nitroGLYCERIN (NITROSTAT) 0.4 MG SL tablet Place 0.4 mg under the tongue.      [name] oxybutynin (DITROPAN) 5 MG tablet Take 5 mg by mouth at bedtime.      . simvastatin (ZOCOR) 40 MG tablet Take 40 mg by mouth at bedtime.       No current facility-administered medications for this visit.    Allergies  Allergen Reactions  . Albuterol Sulfate Hfa [Kdc:Albuterol] Shortness Of Breath and Swelling  . Adhesive [Tape] Other (See Comments)     REACTION: SKIN BLISTERS  . Albuterol Swelling    Per pt., side of his neck began to swell with nebulized Albuterol in doctor's office.  . Contrast Media [Iodinated Diagnostic Agents] Other (See Comments)    Reaction unknown  . Lactose     Other reaction(s): GI Upset (intolerance)  . Metrizamide Other (See Comments)    Reaction unknown  . Penicillins Other (See Comments) and Itching    REACTION: ITCHING HANDS  . Sulfa Drugs Cross Reactors Hives  . Sulfamethoxazole Rash    History   Social History  . Marital Status: Married    Spouse Name: [name]    Number of Children: 3  . Years of Education: N/A   Occupational History  . Business owner, Press photographer, retired    Social History Main Topics  . Smoking status: Former Research scientist (life sciences)  . Smokeless tobacco: Never Used  . Alcohol Use: No  . Drug Use: No  . Sexual Activity: No   Other Topics Concern  . Not on file   Social History Narrative   Married.  Lives with his wife.  Ambulates with a walker and a cane.     Review of Systems: General: negative for chills, fever, night sweats or weight changes.  Cardiovascular: negative for chest pain, dyspnea on exertion, edema, orthopnea, palpitations, paroxysmal nocturnal dyspnea or shortness of breath Dermatological: negative for rash Respiratory: negative for cough or wheezing Urologic: negative for hematuria Abdominal: negative for nausea, vomiting, diarrhea, bright red blood per rectum, melena, or hematemesis Neurologic: negative for visual changes, syncope, or dizziness All other systems reviewed and are otherwise negative except as noted above.    Blood pressure 150/80, pulse 58, height 5\' 6"  (1.676 m), weight 127 lb (57.607 kg).  General appearance: alert, cooperative and no distress Neck: no carotid bruit and no JVD Lungs: clear to auscultation bilaterally Heart: regular rate and rhythm, S1, S2 normal, no murmur, click, rub or gallop Extremities: no LEE Pulses: 2+ and  symmetric Skin: warm and dry Neurologic: Grossly normal  EKG Sinus Brady, HR 58 bpm. RBBB  ASSESSMENT AND PLAN:   Chest pain Symptoms are concerning for angina, left sided chest pressure, occuring at rest and relieved with NTG. Given his age, will first assess with a NST to see if symptoms are in fact possibly cardiac related. I have discussed case with Dr. Claiborne Billings, DOD, and he feels that he is safe to undergo nuclear stress testing.     PLAN  Will plan for a NST. F/u in 1-2 weeks  for reassessment and to review study results.    Brittainy SimmonsPA-C 01/28/2014 4:13 PM

## 2014-02-04 NOTE — Telephone Encounter (Signed)
Encounter Closed---5/8 TP

## 2014-02-08 ENCOUNTER — Emergency Department (HOSPITAL_COMMUNITY): Payer: Medicare Other

## 2014-02-08 ENCOUNTER — Telehealth (HOSPITAL_COMMUNITY): Payer: Self-pay

## 2014-02-08 ENCOUNTER — Encounter (HOSPITAL_COMMUNITY): Payer: Self-pay | Admitting: Emergency Medicine

## 2014-02-08 ENCOUNTER — Emergency Department (HOSPITAL_COMMUNITY)
Admission: EM | Admit: 2014-02-08 | Discharge: 2014-02-08 | Disposition: A | Discharge status: Home / Self Care | Payer: Medicare Other | Attending: Emergency Medicine | Admitting: Emergency Medicine

## 2014-02-08 DIAGNOSIS — S79919A Unspecified injury of unspecified hip, initial encounter: Secondary | ICD-10-CM | POA: Insufficient documentation

## 2014-02-08 DIAGNOSIS — S199XXA Unspecified injury of neck, initial encounter: Secondary | ICD-10-CM

## 2014-02-08 DIAGNOSIS — Y929 Unspecified place or not applicable: Secondary | ICD-10-CM | POA: Insufficient documentation

## 2014-02-08 DIAGNOSIS — K219 Gastro-esophageal reflux disease without esophagitis: Secondary | ICD-10-CM | POA: Insufficient documentation

## 2014-02-08 DIAGNOSIS — W19XXXA Unspecified fall, initial encounter: Secondary | ICD-10-CM | POA: Insufficient documentation

## 2014-02-08 DIAGNOSIS — F329 Major depressive disorder, single episode, unspecified: Secondary | ICD-10-CM | POA: Insufficient documentation

## 2014-02-08 DIAGNOSIS — M1990 Unspecified osteoarthritis, unspecified site: Secondary | ICD-10-CM | POA: Insufficient documentation

## 2014-02-08 DIAGNOSIS — Z79899 Other long term (current) drug therapy: Secondary | ICD-10-CM | POA: Insufficient documentation

## 2014-02-08 DIAGNOSIS — IMO0002 Reserved for concepts with insufficient information to code with codable children: Secondary | ICD-10-CM | POA: Insufficient documentation

## 2014-02-08 DIAGNOSIS — I2510 Atherosclerotic heart disease of native coronary artery without angina pectoris: Secondary | ICD-10-CM | POA: Insufficient documentation

## 2014-02-08 DIAGNOSIS — Z9889 Other specified postprocedural states: Secondary | ICD-10-CM | POA: Insufficient documentation

## 2014-02-08 DIAGNOSIS — F3289 Other specified depressive episodes: Secondary | ICD-10-CM | POA: Insufficient documentation

## 2014-02-08 DIAGNOSIS — N390 Urinary tract infection, site not specified: Secondary | ICD-10-CM | POA: Insufficient documentation

## 2014-02-08 DIAGNOSIS — Z87891 Personal history of nicotine dependence: Secondary | ICD-10-CM | POA: Insufficient documentation

## 2014-02-08 DIAGNOSIS — Z8673 Personal history of transient ischemic attack (TIA), and cerebral infarction without residual deficits: Secondary | ICD-10-CM | POA: Insufficient documentation

## 2014-02-08 DIAGNOSIS — Z880 Allergy status to penicillin: Secondary | ICD-10-CM | POA: Insufficient documentation

## 2014-02-08 DIAGNOSIS — Z8619 Personal history of other infectious and parasitic diseases: Secondary | ICD-10-CM | POA: Insufficient documentation

## 2014-02-08 DIAGNOSIS — S42001A Fracture of unspecified part of right clavicle, initial encounter for closed fracture: Secondary | ICD-10-CM

## 2014-02-08 DIAGNOSIS — S79929A Unspecified injury of unspecified thigh, initial encounter: Secondary | ICD-10-CM

## 2014-02-08 DIAGNOSIS — Y939 Activity, unspecified: Secondary | ICD-10-CM | POA: Insufficient documentation

## 2014-02-08 DIAGNOSIS — Z9861 Coronary angioplasty status: Secondary | ICD-10-CM | POA: Insufficient documentation

## 2014-02-08 DIAGNOSIS — S0993XA Unspecified injury of face, initial encounter: Secondary | ICD-10-CM | POA: Insufficient documentation

## 2014-02-08 DIAGNOSIS — Z8551 Personal history of malignant neoplasm of bladder: Secondary | ICD-10-CM | POA: Insufficient documentation

## 2014-02-08 DIAGNOSIS — E119 Type 2 diabetes mellitus without complications: Secondary | ICD-10-CM | POA: Insufficient documentation

## 2014-02-08 DIAGNOSIS — S0990XA Unspecified injury of head, initial encounter: Secondary | ICD-10-CM | POA: Insufficient documentation

## 2014-02-08 DIAGNOSIS — M810 Age-related osteoporosis without current pathological fracture: Secondary | ICD-10-CM | POA: Insufficient documentation

## 2014-02-08 DIAGNOSIS — Z8546 Personal history of malignant neoplasm of prostate: Secondary | ICD-10-CM | POA: Insufficient documentation

## 2014-02-08 DIAGNOSIS — E785 Hyperlipidemia, unspecified: Secondary | ICD-10-CM | POA: Insufficient documentation

## 2014-02-08 DIAGNOSIS — I10 Essential (primary) hypertension: Secondary | ICD-10-CM | POA: Insufficient documentation

## 2014-02-08 DIAGNOSIS — Z794 Long term (current) use of insulin: Secondary | ICD-10-CM | POA: Insufficient documentation

## 2014-02-08 DIAGNOSIS — S42023A Displaced fracture of shaft of unspecified clavicle, initial encounter for closed fracture: Secondary | ICD-10-CM | POA: Insufficient documentation

## 2014-02-08 LAB — URINALYSIS, ROUTINE W REFLEX MICROSCOPIC
BILIRUBIN URINE: NEGATIVE
Glucose, UA: NEGATIVE mg/dL
KETONES UR: NEGATIVE mg/dL
Nitrite: POSITIVE — AB
PROTEIN: 30 mg/dL — AB
Specific Gravity, Urine: 1.009 (ref 1.005–1.030)
UROBILINOGEN UA: 0.2 mg/dL (ref 0.0–1.0)
pH: 7 (ref 5.0–8.0)

## 2014-02-08 LAB — URINE MICROSCOPIC-ADD ON

## 2014-02-08 MED ORDER — ACETAMINOPHEN 325 MG PO TABS
650.0000 mg | ORAL_TABLET | Freq: Once | ORAL | Status: AC
  Administered 2014-02-08: 650 mg via ORAL
  Filled 2014-02-08: qty 2

## 2014-02-08 MED ORDER — CEPHALEXIN 500 MG PO CAPS: 500.0000 mg | ORAL_CAPSULE | Freq: Three times a day (TID) | ORAL | Status: DC

## 2014-02-08 NOTE — ED Notes (Signed)
Bed: WA16 Expected date:  Expected time:  Means of arrival:  Comments: EMS 

## 2014-02-08 NOTE — ED Notes (Signed)
Per EMS pt coming from home with c/o unwitnessed fall, c/o neck pain, R shoulder pain and L hip pain. EMS reports per family pt has had multiple falls in recent past.

## 2014-02-08 NOTE — Progress Notes (Signed)
  CARE MANAGEMENT ED NOTE 02/08/2014  Patient:  Guy [name], Guy [name]   Account Number:  [id number]  Date Initiated:  02/08/2014  Documentation initiated by:  Livia Snellen  Subjective/Objective Assessment:   Patient presents to Ed post fall with injury to left hip.     Subjective/Objective Assessment Detail:     Action/Plan:   Action/Plan Detail:   Anticipated DC Date:       Status Recommendation to Physician:   Result of Recommendation:    Other ED Services  Consult Working [place]  Other    Choice offered to / List presented to:            Status of service:  Completed, signed off  ED Comments:   ED Comments Detail:  EDCM spoke to patient and his wife and caretaker at bedside.  Patient's wife reports patient currently has home health services with [facility], PT, OT, RN and may possibly get speech therapy for swallowing difficulty.  Patient's caretaker is from [employer].  Patient's caretaker assists patient with ADL's and is with the patient two hours a day five days a week. Patient hads a walker, cane, wheelchair, elevated toilet seat, shower chair and transport chair at home.  Patient and patient's family thankful for [facility] concern.  No further EDCM needs at this time.

## 2014-02-08 NOTE — ED Notes (Addendum)
Per patient's wife (pt very hard of hearing)- caregiver was with patient. Caregiver did not visualize pt falling but post fall c/o left hip pain, right shoulder pain, and neck pain. Reports he was alert and responsive. No other complaints at this time. Not taking any blood thinners at this time. MD at bedside to update wife and caregiver. No c/o pain in thoracic or lumbar spine but does flinch in pain on right shoulder, neck and left hip.

## 2014-02-08 NOTE — Progress Notes (Signed)
CSW met with patient at bedside to complete this assessment.  Patient presents as soft spoken, alert, oriented x3, calm, and cooperative.  Patient reports that he has fallen 5xs in the past month but this is the first time needing medical care.  Patient reports that he is receiving care giver services with [facility] Monday through Friday.  Patient reports his current lives with his wife and has other family supports.  At this time he believes that the treatment team will allow him to return home so he do not need SNF resources.      Chesley Noon, MSW, [place], 02/08/2014 Evening Clinical Social Worker [phone]

## 2014-02-08 NOTE — ED Provider Notes (Signed)
CSN: [id number]     Arrival date & time 02/08/14  1549 History   First MD Initiated Contact with Patient 02/08/14 1604     Chief Complaint  Patient presents with  . Fall     (Consider location/radiation/quality/duration/timing/severity/associated sxs/prior Treatment) HPI Comments: 80 yo male with unwitnessed fall.  Caregiver immediately to pt's side and denies LOC.  Pt stated that he was trying to walk to the air conditioner.  Has had multiple falls recently.    Patient is a 80 y.o. male presenting with fall.  Fall This is a recurrent problem. The current episode started less than 1 hour ago. Episode frequency: once. The problem has been resolved. Associated symptoms comments: Right shoulder pain, left hip pain, neck pain. Exacerbated by: movement of affected areas. Nothing relieves the symptoms. He has tried nothing for the symptoms.    Past Medical History  Diagnosis Date  . Hypertension   . Diabetes mellitus without complication   . Arthritis   . Sciatica   . GERD (gastroesophageal reflux disease)   . Urge incontinence   . Vertigo   . Hyperlipidemia   . ED (erectile dysfunction)   . Gastric polyposis   . Osteoporosis   . Decreased libido   . Depression   . Hepatitis A   . Stroke   . History of blood transfusion     1946  . OA (osteoarthritis)   . Bladder cancer   . Prostate cancer     S/P prostatectomy; Lung metastasis  . CAD (coronary artery disease) 1996    stent to the LAD   . Hyperlipidemia   . Type 2 diabetes mellitus    Past Surgical History  Procedure Laterality Date  . Carpal tunnel release    . Cataract extraction w/ intraocular lens  implant, bilateral  1998  . Prostatectomy    . Urinary sphincter implant    . Urinary sphincter implant revision    . Appendectomy    . Left hip surgery      Donated bone for bone graft to arm  . Penile prosthesis implant      S/P removal and re-implantation of new prosthesis  . Middle ear surgery    . Finger surgery       Left and right  . Hernia repair    . Bladder surgery    . Right arm bone graft      Pathological fracture  . Cardiac catheterization  11/27/2001    patent stent with mild in-stent restenosis.  . Myoview  06/12/2009    Persantine myoview EF 76%; Normal myoview  . Coronary angioplasty  1996    stenting to the LAD in [place].    Family History  Problem Relation Age of Onset  . Heart failure Mother   . Bladder Cancer Father   . Pancreatic cancer Sister   . Lung cancer Sister   . Breast cancer Daughter   . Liver disease Son    History  Substance Use Topics  . Smoking status: Former Research scientist (life sciences)  . Smokeless tobacco: Never Used  . Alcohol Use: No    Review of Systems  All other systems reviewed and are negative.     Allergies  Albuterol sulfate hfa; Adhesive; Albuterol; Contrast media; Lactose; Metrizamide; Penicillins; Sulfa drugs cross reactors; and Sulfamethoxazole  Home Medications   Prior to Admission medications   Medication Sig Start Date End Date Taking? Authorizing Provider  amLODipine (NORVASC) 10 MG tablet Take 10 mg by mouth  daily.   Yes Historical Provider, MD  bicalutamide (CASODEX) 50 MG tablet Take 50 mg by mouth daily.   Yes Historical Provider, MD  Calcium Carbonate-Vitamin D (CALCIUM 600+D HIGH POTENCY) 600-400 MG-UNIT per tablet Take 1 tablet by mouth 2 (two) times daily.     Yes Historical Provider, MD  carboxymethylcellulose (REFRESH PLUS) 0.5 % SOLN Place 1 drop into both eyes 2 (two) times daily as needed (for dry eyes).   Yes Historical Provider, MD  fentaNYL (DURAGESIC - DOSED MCG/HR) 25 MCG/HR patch Place 25 mcg onto the skin every 3 (three) days.   Yes Historical Provider, MD  fluticasone (FLONASE) 50 MCG/ACT nasal spray Place 2 sprays into the nose 2 (two) times daily.   Yes Historical Provider, MD  HYDROcodone-acetaminophen (NORCO/VICODIN) 5-325 MG per tablet Take 1 tablet by mouth every 6 (six) hours as needed for moderate pain. 01/05/14  Yes  Domenic Polite, MD  insulin aspart (NOVOLOG) 100 UNIT/ML injection Inject 0-6 Units into the skin 2 (two) times daily before a meal. 0-150=0; 151-200=2u; 201-250=3u; 251-300=4u; 301-350=5u; over 350 =6u prior to meals. With breakfast and lunch. 01/18/13  Yes Gerlene Fee, NP  insulin glargine (LANTUS) 100 UNIT/ML injection Inject 28 Units into the skin at bedtime.    Yes Historical Provider, MD  lipase/protease/amylase (CREON-12/PANCREASE) 12000 UNITS CPEP Take 4 capsules by mouth 3 (three) times daily.   Yes Historical Provider, MD  Melatonin 3 MG TABS Take 3 mg by mouth at bedtime.   Yes Historical Provider, MD  metoprolol succinate (TOPROL-XL) 50 MG 24 hr tablet Take 50 mg by mouth daily. Take with or immediately following a meal.   Yes Historical Provider, MD  Multiple Vitamins-Minerals (MULTIVITAMINS THER. W/MINERALS) TABS Take 1 tablet by mouth every morning.    Yes Historical Provider, MD  nitroGLYCERIN (NITROSTAT) 0.4 MG SL tablet Place 0.4 mg under the tongue.   Yes Historical Provider, MD  sertraline (ZOLOFT) 100 MG tablet Take 100 mg by mouth daily.   Yes Historical Provider, MD  simvastatin (ZOCOR) 40 MG tablet Take 40 mg by mouth at bedtime.   Yes Historical Provider, MD   BP 152/72  Pulse 60  Temp(Src) 97.9 F (36.6 C) (Oral)  Resp 16  SpO2 98% Physical Exam  Nursing note and vitals reviewed. Constitutional: He is oriented to person, place, and time. He appears well-developed and well-nourished. No distress.  HENT:  Head: Normocephalic and atraumatic. Head is without raccoon's eyes and without Battle's sign.  Nose: Nose normal.  Eyes: Conjunctivae and EOM are normal. Pupils are equal, round, and reactive to light. No scleral icterus.  Neck: No spinous process tenderness and no muscular tenderness present.  Cardiovascular: Normal rate, regular rhythm, normal heart sounds and intact distal pulses.   No murmur heard. Pulmonary/Chest: Effort normal and breath sounds normal. He  has no rales. He exhibits no tenderness.  Abdominal: Soft. There is no tenderness. There is no rebound and no guarding.  Musculoskeletal: Normal range of motion. He exhibits no edema.       Right shoulder: He exhibits tenderness and bony tenderness. He exhibits normal range of motion and normal pulse.       Left hip: He exhibits tenderness and bony tenderness. He exhibits normal range of motion (pain with ROM.  2+ distal pulses.), normal strength, no swelling, no deformity and no laceration.       Thoracic back: He exhibits no tenderness and no bony tenderness.       Lumbar back:  He exhibits no tenderness and no bony tenderness.  No evidence of trauma to extremities, except as noted.  2+ distal pulses.    Neurological: He is alert and oriented to person, place, and time.  Skin: Skin is warm and dry. No rash noted.  Psychiatric: He has a normal mood and affect.    ED Course  Procedures (including critical care time) Labs Review Labs Reviewed  URINALYSIS, ROUTINE W REFLEX MICROSCOPIC - Abnormal; Notable for the following:    APPearance CLOUDY (*)    Hgb urine dipstick MODERATE (*)    Protein, ur 30 (*)    Nitrite POSITIVE (*)    Leukocytes, UA SMALL (*)    All other components within normal limits  URINE MICROSCOPIC-ADD ON - Abnormal; Notable for the following:    Bacteria, UA MANY (*)    All other components within normal limits  URINE CULTURE    Imaging Review Dg Shoulder Right  02/08/2014   CLINICAL DATA:  Fall.  Right shoulder pain.  EXAM: RIGHT SHOULDER - 2+ VIEW  COMPARISON:  DG SHOULDER *R* dated 11/27/2008; DG THORACIC SPINE dated 12/30/2013  FINDINGS: No interval change. Healed proximal right humerus fracture. No recurrent acute fracture of the right humerus is identified. Osteopenia is present. Upper to mid thoracic compression fractures show progressive collapse compared to prior.  The scapula appears intact moderate to severe AC joint osteoarthritis. Seen on chondrocalcinosis  with superior extrusion of the [facility] joint cartilage disc.  Bony irregularity at the base of the acromion appears projectional.  IMPRESSION: No acute osseous abnormality. Healed fracture of the proximal right humerus. Diffuse osteopenia. Given the underlying deformity, if there is high suspicion of an occult injury, CT should be obtained without contrast.   Electronically Signed   By: Dereck Ligas M.D.   On: 02/08/2014 17:46   Dg Hip Complete Left  02/08/2014   CLINICAL DATA:  Fall.  Left hip pain.  EXAM: LEFT HIP - COMPLETE 2+ VIEW  COMPARISON:  DG HIP 1 VIEW*L* dated 11/17/2012; DG HIP COMPLETE*L* dated 06/14/2010  FINDINGS: There is new calcific density just superior to the left greater trochanter. This is suggestive of a greater trochanter avulsion fracture. Left femoral neck appears intact. Chondrocalcinosis of the left hip.  Surgical clips in the anatomic pelvis and bone cement in the sacrum are compatible with postoperative and postprocedural changes. Partially visualized L3 vertebral augmentation also noted in addition to the sacral plasty. Dense calcification of the vas deferens, most commonly seen in diabetics.  Osteopenia.  Severe atherosclerosis.  IMPRESSION: Greater trochanter avulsion fracture of the left hip, likely acute.   Electronically Signed   By: Dereck Ligas M.D.   On: 02/08/2014 17:42   Ct Head Wo Contrast  02/08/2014   CLINICAL DATA:  Fall, head pain, neck pain, history of stroke, history of bladder and prostate cancer  EXAM: CT HEAD WITHOUT CONTRAST  CT CERVICAL SPINE WITHOUT CONTRAST  TECHNIQUE: Multidetector CT imaging of the head and cervical spine was performed following the standard protocol without intravenous contrast. Multiplanar CT image reconstructions of the cervical spine were also generated.  COMPARISON:  CT HEAD W/O CM dated 12/27/2013; CT T SPINE W/O CM dated 12/27/2013  FINDINGS: CT HEAD FINDINGS  Moderate age-related atrophy. Moderate low attenuation in the deep white  matter. Chronic sub cm lacunar infarct left basal ganglia and. This is stable. No acute vascular territory infarct. No evidence of parenchymal hemorrhage or extra-axial fluid.  Chronic opacification right maxillary sinus consistent  with chronic sinusitis. This is stable. No skull fracture.  CT CERVICAL SPINE FINDINGS  Left vertebral artery calcification. Calcified subcentimeter lung nodules right apex, likely granulomas. Exaggerated lordosis. No prevertebral soft tissue swelling. Diffuse osteopenia. Significant multilevel degenerative disc disease. No fractures of the cervical spine. There is a severe T3 compression fracture that is partially visualized. This is similar to CT scan performed 12/27/2013.  IMPRESSION: Chronic age-related intracranial abnormalities. Chronic maxillary sinusitis on the right.  Degenerative changes of the cervical spine with chronic T3 compression deformity.   Electronically Signed   By: Skipper Cliche M.D.   On: 02/08/2014 17:25   Ct Cervical Spine Wo Contrast  02/08/2014   CLINICAL DATA:  Fall, head pain, neck pain, history of stroke, history of bladder and prostate cancer  EXAM: CT HEAD WITHOUT CONTRAST  CT CERVICAL SPINE WITHOUT CONTRAST  TECHNIQUE: Multidetector CT imaging of the head and cervical spine was performed following the standard protocol without intravenous contrast. Multiplanar CT image reconstructions of the cervical spine were also generated.  COMPARISON:  CT HEAD W/O CM dated 12/27/2013; CT T SPINE W/O CM dated 12/27/2013  FINDINGS: CT HEAD FINDINGS  Moderate age-related atrophy. Moderate low attenuation in the deep white matter. Chronic sub cm lacunar infarct left basal ganglia and. This is stable. No acute vascular territory infarct. No evidence of parenchymal hemorrhage or extra-axial fluid.  Chronic opacification right maxillary sinus consistent with chronic sinusitis. This is stable. No skull fracture.  CT CERVICAL SPINE FINDINGS  Left vertebral artery  calcification. Calcified subcentimeter lung nodules right apex, likely granulomas. Exaggerated lordosis. No prevertebral soft tissue swelling. Diffuse osteopenia. Significant multilevel degenerative disc disease. No fractures of the cervical spine. There is a severe T3 compression fracture that is partially visualized. This is similar to CT scan performed 12/27/2013.  IMPRESSION: Chronic age-related intracranial abnormalities. Chronic maxillary sinusitis on the right.  Degenerative changes of the cervical spine with chronic T3 compression deformity.   Electronically Signed   By: Skipper Cliche M.D.   On: 02/08/2014 17:25   Ct Shoulder Right Wo Contrast  02/08/2014   CLINICAL DATA:  Fall.  Right shoulder fracture.  EXAM: CT OF THE RIGHT SHOULDER WITHOUT CONTRAST  TECHNIQUE: Multidetector CT imaging was performed according to the standard protocol. Multiplanar CT image reconstructions were also generated.  COMPARISON:  None.  FINDINGS: Profound osteopenia is present. There is a nondisplaced fracture of the distal third of the right clavicle without extension into the right AC joint. Healed fracture of the proximal right humerus is present, with heterotopic bone anterior to the humerus. The glenohumeral joint is located. The acromion is intact. Calcified granulomata present in the right upper lobe. Dependent atelectasis on the right lung. Severe AC joint osteoarthritis is present.  IMPRESSION: 1. Nondisplaced fracture of the distal third of the right clavicle. No intra-articular extension. 2. Healed proximal right humerus fracture. 3. Old granulomatous disease of the lungs.   Electronically Signed   By: Dereck Ligas M.D.   On: 02/08/2014 19:52   Ct Hip Left Wo Contrast  02/08/2014   CLINICAL DATA:  Fall.  Left hip pain.  EXAM: CT OF THE LEFT HIP WITHOUT CONTRAST  TECHNIQUE: Multidetector CT imaging was performed according to the standard protocol. Multiplanar CT image reconstructions were also generated.   COMPARISON:  DG HIP COMPLETE*L* dated 02/08/2014  FINDINGS: Profound osteopenia is present. There is no acute fracture of the proximal left femur. Severe left hip osteoarthritis is present with chondrocalcinosis and subchondral cysts in  the acetabulum. The cortical irregularity identified on prior plain film corresponds with an old greater trochanter fracture, with some heterotopic ossification that blends with calcific gluteal tendinitis. There is an exostosis extending off the anterior superior iliac spine, likely posttraumatic. There is no hip effusion. Pelvic lymphadenectomy surgical clips are present. Left femoral neck appears intact.  IMPRESSION: No acute osseous abnormality. Bony density superior to the left greater trochanter is old and appears unchanged compared to prior CT 06/24/2013.   Electronically Signed   By: Dereck Ligas M.D.   On: 02/08/2014 19:49  All radiology studies independently viewed by me.      EKG Interpretation None      MDM   Final diagnoses:  Fall  Right clavicle fracture  UTI (urinary tract infection)    80 yo male with fall at home.  Unwitnessed.  No LOC.  Complains of right shoulder, left hip, and neck pain.  Plan imaging of these areas.    CT cervical spine and head negative for acute injuries.  Obtained CT imaging to further evaluate greater trochanter left femur avulsion fracture and right humerus occult fracture.  These CTs demonstrated no left hip fracture and no humerus fracture ,but did show right clavicle fracture.  Plan sling and follow up.    UA showed nitrite positive UTI.  Will treat with keflex.  Advised PCP follow up.    Houston Siren III, MD 02/08/14 2100

## 2014-02-08 NOTE — Consult Note (Signed)
Spoke with ED MD.  Patient with what appears to be isolated greater troch fracture, min displacement, able to do SLR although weak.  Rec CT to make sure fx does not extend across intertroch region, and then fu with me as outpatient if truly isolated Greater trochanter fx, WBAT, walker, in home care which is already in place.  If unable to dc from ED, may need admission for pain control and placement.    RTC with me in 1-2 weeks if isolated GT fx, if IT fx, may need surgery.    Johnny [place], MD

## 2014-02-08 NOTE — Discharge Instructions (Signed)
Fall Prevention and Home Safety Falls cause injuries and can affect all age groups. It is possible to use preventive measures to significantly decrease the likelihood of falls. There are many simple measures which can make your home safer and prevent falls. OUTDOORS  Repair cracks and edges of walkways and driveways.  Remove high doorway thresholds.  Trim shrubbery on the main path into your home.  Have good outside lighting.  Clear walkways of tools, rocks, debris, and clutter.  Check that handrails are not broken and are securely fastened. Both sides of steps should have handrails.  Have leaves, snow, and ice cleared regularly.  Use sand or salt on walkways during winter months.  In the garage, clean up grease or oil spills. BATHROOM  Install night lights.  Install grab bars by the toilet and in the tub and shower.  Use non-skid mats or decals in the tub or shower.  Place a plastic non-slip stool in the shower to sit on, if needed.  Keep floors dry and clean up all water on the floor immediately.  Remove soap buildup in the tub or shower on a regular basis.  Secure bath mats with non-slip, double-sided rug tape.  Remove throw rugs and tripping hazards from the floors. BEDROOMS  Install night lights.  Make sure a bedside light is easy to reach.  Do not use oversized bedding.  Keep a telephone by your bedside.  Have a firm chair with side arms to use for getting dressed.  Remove throw rugs and tripping hazards from the floor. KITCHEN  Keep handles on pots and pans turned toward the center of the stove. Use back burners when possible.  Clean up spills quickly and allow time for drying.  Avoid walking on wet floors.  Avoid hot utensils and knives.  Position shelves so they are not too high or low.  Place commonly used objects within easy reach.  If necessary, use a sturdy step stool with a grab bar when reaching.  Keep electrical cables out of the  way.  Do not use floor polish or wax that makes floors slippery. If you must use wax, use non-skid floor wax.  Remove throw rugs and tripping hazards from the floor. STAIRWAYS  Never leave objects on stairs.  Place handrails on both sides of stairways and use them. Fix any loose handrails. Make sure handrails on both sides of the stairways are as long as the stairs.  Check carpeting to make sure it is firmly attached along stairs. Make repairs to worn or loose carpet promptly.  Avoid placing throw rugs at the top or bottom of stairways, or properly secure the rug with carpet tape to prevent slippage. Get rid of throw rugs, if possible.  Have an electrician put in a light switch at the top and bottom of the stairs. OTHER FALL PREVENTION TIPS  Wear low-heel or rubber-soled shoes that are supportive and fit well. Wear closed toe shoes.  When using a stepladder, make sure it is fully opened and both spreaders are firmly locked. Do not climb a closed stepladder.  Add color or contrast paint or tape to grab bars and handrails in your home. Place contrasting color strips on first and last steps.  Learn and use mobility aids as needed. Install an electrical emergency response system.  Turn on lights to avoid dark areas. Replace light bulbs that burn out immediately. Get light switches that glow.  Arrange furniture to create clear pathways. Keep furniture in the same place.  Firmly attach carpet with non-skid or double-sided tape.  Eliminate uneven floor surfaces.  Select a carpet pattern that does not visually hide the edge of steps.  Be aware of all pets. OTHER HOME SAFETY TIPS  Set the water temperature for 120 F (48.8 C).  Keep emergency numbers on or near the telephone.  Keep smoke detectors on every level of the home and near sleeping areas. Document Released: 09/06/2002 Document Revised: 03/17/2012 Document Reviewed: 12/06/2011 [facility] Patient Information 2014  [place].  Clavicle Fracture A clavicle fracture is a break in the collarbone. This is a common injury, especially in children. Collarbones do not harden until around the age of 70. Most collarbone fractures are treated with a simple arm sling. In some cases a figure-of-eight splint is used to help hold the broken bones in position. Although not often needed, surgery may be required if the bone fragments are not in the correct position (displaced).  HOME CARE INSTRUCTIONS   Apply ice to the injury for 15-20 minutes each hour while awake for 2 days. Put the ice in a plastic bag and place a towel between the bag of ice and your skin.  Wear the sling or splint constantly for as long as directed by your caregiver. You may remove the sling or splint for bathing or showering. Be sure to keep your shoulder in the same place as when the sling or splint is on. Do not lift your arm.  If a figure-of-eight splint is applied, it must be tightened by another person every day. Tighten it enough to keep the shoulders held back. Allow enough room to place the index finger between the body and strap. Loosen the splint immediately if you feel numbness or tingling in your hands.  Only take over-the-counter or prescription medicines for pain, discomfort, or fever as directed by your caregiver.  Avoid activities that irritate or increase the pain for 4 to 6 weeks after surgery.  Follow all instructions for follow-up with your caregiver. This includes any referrals, physical therapy, and rehabilitation. Any delay in obtaining necessary care could result in a delay or failure of the injury to heal properly. SEEK MEDICAL CARE IF:  You have pain and swelling that are not relieved with medications. SEEK IMMEDIATE MEDICAL CARE IF:  Your arm is numb, cold, or pale, even when the splint is loose. MAKE SURE YOU:   Understand these instructions.  Will watch your condition.  Will get help right away if you are not  doing well or get worse. Document Released: 06/26/2005 Document Revised: 12/09/2011 Document Reviewed: 04/21/2008 [facility] Patient Information 2014 [place].

## 2014-02-10 ENCOUNTER — Encounter (HOSPITAL_COMMUNITY): Payer: Medicare Other

## 2014-02-10 LAB — URINE CULTURE: Colony Count: >=100000

## 2014-02-11 NOTE — ED Notes (Signed)
Post ED Visit - Positive Culture Follow-up  Culture report reviewed by antimicrobial stewardship pharmacist: []  Wes [place], Pharm.D., BCPS [x]  Heide Guile, Pharm.D., BCPS []  Alycia Rossetti, Pharm.D., BCPS []  [place], Pharm.D., BCPS, AAHIVP []  Legrand [place]o, Pharm.D., BCPS, AAHIVP []  Juliene Pina, Pharm.D.  Positive urine culture Treated with cephalexin, organism sensitive to the same and no further patient follow-up is required at this time.  Ileene Musa 02/11/2014, 1:18 PM

## 2014-02-14 ENCOUNTER — Ambulatory Visit (HOSPITAL_COMMUNITY): Payer: Medicare Other

## 2014-02-18 ENCOUNTER — Other Ambulatory Visit (HOSPITAL_COMMUNITY): Payer: Self-pay | Admitting: Otolaryngology

## 2014-02-18 DIAGNOSIS — R1310 Dysphagia, unspecified: Secondary | ICD-10-CM

## 2014-02-24 ENCOUNTER — Ambulatory Visit (HOSPITAL_COMMUNITY)
Admission: RE | Admit: 2014-02-24 | Discharge: 2014-02-24 | Disposition: A | Discharge status: Home / Self Care | Payer: Medicare Other | Source: Ambulatory Visit | Attending: Otolaryngology | Admitting: Otolaryngology

## 2014-02-24 DIAGNOSIS — R1310 Dysphagia, unspecified: Secondary | ICD-10-CM

## 2014-02-24 DIAGNOSIS — W19XXXA Unspecified fall, initial encounter: Secondary | ICD-10-CM | POA: Insufficient documentation

## 2014-02-24 DIAGNOSIS — R1313 Dysphagia, pharyngeal phase: Secondary | ICD-10-CM | POA: Insufficient documentation

## 2014-02-24 NOTE — Procedures (Signed)
Objective Swallowing Evaluation: Modified Barium Swallowing Study  Patient Details  Name: Guy [name] MRN: [id number] Date of Birth: 1920-05-25  Today's Date: 02/24/2014 Time: 1145-1200 SLP Time Calculation (min): 15 min  Past Medical History:  Past Medical History  Diagnosis Date  . Hypertension   . Diabetes mellitus without complication   . Arthritis   . Sciatica   . GERD (gastroesophageal reflux disease)   . Urge incontinence   . Vertigo   . Hyperlipidemia   . ED (erectile dysfunction)   . Gastric polyposis   . Osteoporosis   . Decreased libido   . Depression   . Hepatitis A   . Stroke   . History of blood transfusion     1946  . OA (osteoarthritis)   . Bladder cancer   . Prostate cancer     S/P prostatectomy; Lung metastasis  . CAD (coronary artery disease) 1996    stent to the LAD   . Hyperlipidemia   . Type 2 diabetes mellitus    Past Surgical History:  Past Surgical History  Procedure Laterality Date  . Carpal tunnel release    . Cataract extraction w/ intraocular lens  implant, bilateral  1998  . Prostatectomy    . Urinary sphincter implant    . Urinary sphincter implant revision    . Appendectomy    . Left hip surgery      Donated bone for bone graft to arm  . Penile prosthesis implant      S/P removal and re-implantation of new prosthesis  . Middle ear surgery    . Finger surgery      Left and right  . Hernia repair    . Bladder surgery    . Right arm bone graft      Pathological fracture  . Cardiac catheterization  11/27/2001    patent stent with mild in-stent restenosis.  . Myoview  06/12/2009    Persantine myoview EF 76%; Normal myoview  . Coronary angioplasty  1996    stenting to the LAD in [place].    HPI:  80 yo male with unwitnessed fall.  Caregiver immediately to pt's side and denies LOC.      Assessment / Plan / Recommendation Clinical Impression  Dysphagia Diagnosis: Moderate pharyngeal phase dysphagia, Moderate cervical  esophageal phase dysphagia Clinical impression: Patient presents with a multifactorial dysphagia with both pharyngeal and esophageal components. Patient with mild base of tongue and laryngeal weakness, which when combined with decreased UES relaxation due to suspected decreased esophageal motility (? stasis noted during esophageal sweep), results in decreased airway protection during and after the swallow. Penetration/aspiration intermittent and minimized with cueing for small sips. Additionally, cued throat clear assisted to clear penetrates from laryngeal vestibule. A chin tuck was not only ineffective for improving airway protection but actually increased pt's incidence of aspiration after the swallow due to residual thin and thick liquids spilling from the pyriforms into the airway. ?Although aspiration risk remains moderate with all liquids, residue greater with nectar thick vs thin liquids and use of precautions do decrease risks. Recommend a dys. 3 diet with thin liquids with strict use of precautions. Education complete with patient and wife.     Treatment Recommendation  Defer treatment plan to SLP at (home health agency)    Diet Recommendation Dysphagia 3 (Mechanical Soft)   Liquid Administration via: Cup Medication Administration: Whole meds with puree Supervision: Patient able to self feed;Full supervision/cueing for compensatory strategies Compensations: Slow rate;Small sips/bites;Clear  throat intermittently Postural Changes and/or Swallow Maneuvers: Out of bed for meals;Seated upright 90 degrees;Upright 30-60 min after meal    Other  Recommendations Recommended Consults: Consider esophageal assessment Oral Care Recommendations: Oral care BID   Follow Up Recommendations  Home health SLP       Pertinent Vitals/Pain n/a      General Date of Onset: 02/08/14 HPI: 80 yo male with unwitnessed fall.  Caregiver immediately to pt's side and denies LOC.  Type of Study: Modified Barium  Swallowing Study Reason for Referral: Objectively evaluate swallowing function Previous Swallow Assessment: n/a Diet Prior to this Study: Regular;Thin liquids Temperature Spikes Noted: No Respiratory Status: Room air History of Recent Intubation: No Behavior/Cognition: Alert;Cooperative;Pleasant mood Oral Cavity - Dentition: Missing dentition;Adequate natural dentition Oral Motor / Sensory Function: Within functional limits Self-Feeding Abilities: Able to feed self Patient Positioning: Upright in chair Baseline Vocal Quality: Low vocal intensity Volitional Cough: Strong Anatomy: Other (Comment) (C4-C5 bony prominence) Pharyngeal Secretions: Normal    Reason for Referral Objectively evaluate swallowing function   Oral Phase Oral Preparation/Oral Phase Oral Phase: WFL   Pharyngeal Phase Pharyngeal Phase Pharyngeal Phase: Impaired Pharyngeal - Nectar Pharyngeal - Nectar Cup: Reduced anterior laryngeal mobility;Premature spillage to valleculae;Reduced tongue base retraction;Pharyngeal residue - pyriform sinuses;Penetration/Aspiration during swallow;Delayed swallow initiation;Reduced airway/laryngeal closure;Reduced laryngeal elevation Penetration/Aspiration details (nectar cup): Material enters airway, CONTACTS cords then ejected out Pharyngeal - Thin Pharyngeal - Thin Cup: Moderate aspiration;Penetration/Aspiration during swallow;Penetration/Aspiration after swallow;Reduced airway/laryngeal closure;Reduced laryngeal elevation;Reduced anterior laryngeal mobility;Premature spillage to valleculae;Delayed swallow initiation Penetration/Aspiration details (thin cup): Material enters airway, CONTACTS cords then ejected out;Material enters airway, passes BELOW cords and not ejected out despite cough attempt by patient Pharyngeal - Solids Pharyngeal - Puree: Reduced anterior laryngeal mobility;Delayed swallow initiation;Premature spillage to valleculae;Reduced tongue base retraction;Pharyngeal  residue - valleculae;Pharyngeal residue - pyriform sinuses;Reduced laryngeal elevation Pharyngeal - Regular: Within functional limits  Cervical Esophageal Phase    GO    Cervical Esophageal Phase Cervical Esophageal Phase: Impaired Cervical Esophageal Phase - Nectar Nectar Cup: Reduced cricopharyngeal relaxation Cervical Esophageal Phase - Thin Thin Cup: Reduced cricopharyngeal relaxation Cervical Esophageal Phase - Solids Puree: Reduced cricopharyngeal relaxation Regular: Within functional limits    Functional Assessment Tool Used: clinical judgment Functional Limitations: Swallowing Swallow Current Status ([id number]): At least 40 percent but less than 60 percent impaired, limited or restricted Swallow Goal Status [phone]): At least 40 percent but less than 60 percent impaired, limited or restricted Swallow Discharge Status [phone]): At least 40 percent but less than 60 percent impaired, limited or restricted       Windell Moulding, M.A. CCC-SLP  Selinda Orion Anedra Penafiel 02/24/2014, 4:58 PM

## 2014-02-25 ENCOUNTER — Encounter (HOSPITAL_COMMUNITY): Payer: Self-pay | Admitting: Emergency Medicine

## 2014-02-25 ENCOUNTER — Emergency Department (HOSPITAL_COMMUNITY)
Admission: EM | Admit: 2014-02-25 | Discharge: 2014-02-25 | Disposition: A | Discharge status: Home / Self Care | Payer: Medicare Other | Attending: Emergency Medicine | Admitting: Emergency Medicine

## 2014-02-25 ENCOUNTER — Emergency Department (HOSPITAL_COMMUNITY): Payer: Medicare Other

## 2014-02-25 DIAGNOSIS — M810 Age-related osteoporosis without current pathological fracture: Secondary | ICD-10-CM | POA: Insufficient documentation

## 2014-02-25 DIAGNOSIS — Z8619 Personal history of other infectious and parasitic diseases: Secondary | ICD-10-CM | POA: Insufficient documentation

## 2014-02-25 DIAGNOSIS — I2510 Atherosclerotic heart disease of native coronary artery without angina pectoris: Secondary | ICD-10-CM | POA: Insufficient documentation

## 2014-02-25 DIAGNOSIS — Y9289 Other specified places as the place of occurrence of the external cause: Secondary | ICD-10-CM | POA: Insufficient documentation

## 2014-02-25 DIAGNOSIS — K219 Gastro-esophageal reflux disease without esophagitis: Secondary | ICD-10-CM | POA: Insufficient documentation

## 2014-02-25 DIAGNOSIS — Z880 Allergy status to penicillin: Secondary | ICD-10-CM | POA: Insufficient documentation

## 2014-02-25 DIAGNOSIS — IMO0002 Reserved for concepts with insufficient information to code with codable children: Secondary | ICD-10-CM | POA: Insufficient documentation

## 2014-02-25 DIAGNOSIS — W19XXXA Unspecified fall, initial encounter: Secondary | ICD-10-CM

## 2014-02-25 DIAGNOSIS — Z9889 Other specified postprocedural states: Secondary | ICD-10-CM | POA: Insufficient documentation

## 2014-02-25 DIAGNOSIS — Z87891 Personal history of nicotine dependence: Secondary | ICD-10-CM | POA: Insufficient documentation

## 2014-02-25 DIAGNOSIS — Z87448 Personal history of other diseases of urinary system: Secondary | ICD-10-CM | POA: Insufficient documentation

## 2014-02-25 DIAGNOSIS — F329 Major depressive disorder, single episode, unspecified: Secondary | ICD-10-CM | POA: Insufficient documentation

## 2014-02-25 DIAGNOSIS — S79919A Unspecified injury of unspecified hip, initial encounter: Secondary | ICD-10-CM | POA: Insufficient documentation

## 2014-02-25 DIAGNOSIS — Z79899 Other long term (current) drug therapy: Secondary | ICD-10-CM | POA: Insufficient documentation

## 2014-02-25 DIAGNOSIS — F3289 Other specified depressive episodes: Secondary | ICD-10-CM | POA: Insufficient documentation

## 2014-02-25 DIAGNOSIS — Z8551 Personal history of malignant neoplasm of bladder: Secondary | ICD-10-CM | POA: Insufficient documentation

## 2014-02-25 DIAGNOSIS — M1990 Unspecified osteoarthritis, unspecified site: Secondary | ICD-10-CM | POA: Insufficient documentation

## 2014-02-25 DIAGNOSIS — Z794 Long term (current) use of insulin: Secondary | ICD-10-CM | POA: Insufficient documentation

## 2014-02-25 DIAGNOSIS — S42009A Fracture of unspecified part of unspecified clavicle, initial encounter for closed fracture: Secondary | ICD-10-CM | POA: Insufficient documentation

## 2014-02-25 DIAGNOSIS — Y9301 Activity, walking, marching and hiking: Secondary | ICD-10-CM | POA: Insufficient documentation

## 2014-02-25 DIAGNOSIS — Z8546 Personal history of malignant neoplasm of prostate: Secondary | ICD-10-CM | POA: Insufficient documentation

## 2014-02-25 DIAGNOSIS — Z8673 Personal history of transient ischemic attack (TIA), and cerebral infarction without residual deficits: Secondary | ICD-10-CM | POA: Insufficient documentation

## 2014-02-25 DIAGNOSIS — E119 Type 2 diabetes mellitus without complications: Secondary | ICD-10-CM | POA: Insufficient documentation

## 2014-02-25 DIAGNOSIS — I10 Essential (primary) hypertension: Secondary | ICD-10-CM | POA: Insufficient documentation

## 2014-02-25 DIAGNOSIS — Z9861 Coronary angioplasty status: Secondary | ICD-10-CM | POA: Insufficient documentation

## 2014-02-25 DIAGNOSIS — E785 Hyperlipidemia, unspecified: Secondary | ICD-10-CM | POA: Insufficient documentation

## 2014-02-25 DIAGNOSIS — S79929A Unspecified injury of unspecified thigh, initial encounter: Secondary | ICD-10-CM

## 2014-02-25 DIAGNOSIS — W010XXA Fall on same level from slipping, tripping and stumbling without subsequent striking against object, initial encounter: Secondary | ICD-10-CM | POA: Insufficient documentation

## 2014-02-25 DIAGNOSIS — R011 Cardiac murmur, unspecified: Secondary | ICD-10-CM | POA: Insufficient documentation

## 2014-02-25 NOTE — ED Notes (Signed)
Per EMS-pt was using walker last night and tripped-did not hit head, no LOC-abrasion to left upper arm-c/o left hip pain, no deformity

## 2014-02-25 NOTE — ED Notes (Signed)
Upon assessment pt complaint of left leg pain. Pt pedal pulses moderate and equal bilaterally. Pt has limited movement with left leg. Abrasions/skin tears noted to left elbow.

## 2014-02-25 NOTE — ED Notes (Signed)
Bed: TF57 Expected date:  Expected time:  Means of arrival:  Comments: EMS-fall

## 2014-02-25 NOTE — ED Notes (Signed)
Ice applied to left leg. Pt transported to xray at present time.

## 2014-02-25 NOTE — ED Provider Notes (Signed)
CSN: [id number]     Arrival date & time 02/25/14  1021 History   First MD Initiated Contact with Patient 02/25/14 1041     Chief Complaint  Patient presents with  . Fall      HPI  Patient presents from home after a fall occurred last night.  The patient uses a walker, has frequent falls. Patient's full recollection of the fall, has had no loss of consciousness.  He has not been ambulatory.  He has had pain in both hips since the fall. He also complains of pain in the left shoulder. Patient denies weakness in any distal extremity, loss of sensation in any distal extremity. No chest pain, belly pain, syncope, confusion, disorientation, additional falls. No relief with anything. Symptoms are worse with motion.   Past Medical History  Diagnosis Date  . Hypertension   . Diabetes mellitus without complication   . Arthritis   . Sciatica   . GERD (gastroesophageal reflux disease)   . Urge incontinence   . Vertigo   . Hyperlipidemia   . ED (erectile dysfunction)   . Gastric polyposis   . Osteoporosis   . Decreased libido   . Depression   . Hepatitis A   . Stroke   . History of blood transfusion     1946  . OA (osteoarthritis)   . Bladder cancer   . Prostate cancer     S/P prostatectomy; Lung metastasis  . CAD (coronary artery disease) 1996    stent to the LAD   . Hyperlipidemia   . Type 2 diabetes mellitus    Past Surgical History  Procedure Laterality Date  . Carpal tunnel release    . Cataract extraction w/ intraocular lens  implant, bilateral  1998  . Prostatectomy    . Urinary sphincter implant    . Urinary sphincter implant revision    . Appendectomy    . Left hip surgery      Donated bone for bone graft to arm  . Penile prosthesis implant      S/P removal and re-implantation of new prosthesis  . Middle ear surgery    . Finger surgery      Left and right  . Hernia repair    . Bladder surgery    . Right arm bone graft      Pathological fracture  . Cardiac  catheterization  11/27/2001    patent stent with mild in-stent restenosis.  . Myoview  06/12/2009    Persantine myoview EF 76%; Normal myoview  . Coronary angioplasty  1996    stenting to the LAD in [place].    Family History  Problem Relation Age of Onset  . Heart failure Mother   . Bladder Cancer Father   . Pancreatic cancer Sister   . Lung cancer Sister   . Breast cancer Daughter   . Liver disease Son    History  Substance Use Topics  . Smoking status: Former Research scientist (life sciences)  . Smokeless tobacco: Never Used  . Alcohol Use: No    Review of Systems  Constitutional:       Per HPI, otherwise negative  HENT:       Per HPI, otherwise negative  Respiratory:       Per HPI, otherwise negative  Cardiovascular:       Per HPI, otherwise negative  Gastrointestinal: Negative for vomiting.  Endocrine:       Negative aside from HPI  Genitourinary:  Neg aside from HPI   Musculoskeletal:       Per HPI, otherwise negative  Skin: Negative.   Neurological: Negative for syncope.      Allergies  Albuterol sulfate hfa; Adhesive; Albuterol; Contrast media; Lactose; Metrizamide; Penicillins; Sulfa drugs cross reactors; and Sulfamethoxazole  Home Medications   Prior to Admission medications   Medication Sig Start Date End Date Taking? Authorizing Provider  amLODipine (NORVASC) 10 MG tablet Take 10 mg by mouth daily.   Yes Historical Provider, MD  bicalutamide (CASODEX) 50 MG tablet Take 50 mg by mouth daily.   Yes Historical Provider, MD  Calcium Carbonate-Vitamin D (CALCIUM 600+D HIGH POTENCY) 600-400 MG-UNIT per tablet Take 1 tablet by mouth 2 (two) times daily.     Yes Historical Provider, MD  fentaNYL (DURAGESIC - DOSED MCG/HR) 25 MCG/HR patch Place 25 mcg onto the skin every 3 (three) days.   Yes Historical Provider, MD  fluticasone (FLONASE) 50 MCG/ACT nasal spray Place 2 sprays into the nose 2 (two) times daily.   Yes Historical Provider, MD  HYDROcodone-acetaminophen  (NORCO/VICODIN) 5-325 MG per tablet Take 1 tablet by mouth every 6 (six) hours as needed for moderate pain. 01/05/14  Yes Domenic Polite, MD  insulin aspart (NOVOLOG) 100 UNIT/ML injection Inject 0-6 Units into the skin 3 (three) times daily with meals. 0-150=0; 151-200=2u; 201-250=3u; 251-300=4u; 301-350=5u; over 350 =6u prior to meals. With breakfast and lunch. 01/18/13  Yes Gerlene Fee, NP  insulin glargine (LANTUS) 100 UNIT/ML injection Inject 28 Units into the skin at bedtime.    Yes Historical Provider, MD  lipase/protease/amylase (CREON-12/PANCREASE) 12000 UNITS CPEP Take 4 capsules by mouth 3 (three) times daily.   Yes Historical Provider, MD  Melatonin 3 MG TABS Take 3 mg by mouth at bedtime.   Yes Historical Provider, MD  metoprolol succinate (TOPROL-XL) 50 MG 24 hr tablet Take 50 mg by mouth daily. Take with or immediately following a meal.   Yes Historical Provider, MD  Multiple Vitamins-Minerals (MULTIVITAMINS THER. W/MINERALS) TABS Take 1 tablet by mouth every morning.    Yes Historical Provider, MD  sertraline (ZOLOFT) 100 MG tablet Take 100 mg by mouth daily.   Yes Historical Provider, MD  simvastatin (ZOCOR) 40 MG tablet Take 40 mg by mouth at bedtime.   Yes Historical Provider, MD  nitroGLYCERIN (NITROSTAT) 0.4 MG SL tablet Place 0.4 mg under the tongue.    Historical Provider, MD   BP 164/83  Pulse 75  Temp(Src) 98.4 F (36.9 C) (Oral)  Resp 18  SpO2 97% Physical Exam  Nursing note and vitals reviewed. Constitutional: He is oriented to person, place, and time. He appears well-developed. He appears cachectic. He appears ill. No distress.  HENT:  Head: Normocephalic and atraumatic.  Eyes: Conjunctivae and EOM are normal.  Cardiovascular: Normal rate, regular rhythm and intact distal pulses.   Murmur heard. Pulmonary/Chest: Effort normal. No stridor. No respiratory distress.  Abdominal: He exhibits no distension.  Musculoskeletal: He exhibits no edema.       Left  shoulder: He exhibits tenderness and bony tenderness. He exhibits normal range of motion, no swelling, no effusion and no crepitus.       Left elbow: Normal.       Left wrist: Normal.       Right hip: He exhibits tenderness and bony tenderness. He exhibits no swelling and no crepitus.       Left hip: He exhibits tenderness and bony tenderness.  Right knee: Normal.       Left knee: Normal.       Right ankle: Normal.       Left ankle: Normal.  Neurological: He is alert and oriented to person, place, and time.  Skin: Skin is warm and dry.  Psychiatric: He has a normal mood and affect.    ED Course  Procedures (including critical care time) Labs Review Labs Reviewed - No data to display  Imaging Review Dg Swallowing Func-speech Pathology  02/25/2014   Selinda Orion Page, CCC-SLP     02/25/2014  7:45 AM Objective Swallowing Evaluation: Modified Barium Swallowing Study   Patient Details  Name: Guy [name] MRN: [id number] Date of Birth: 17-Jul-1920  Today's Date: 02/24/2014 Time: 1145-1200 SLP Time Calculation (min): 15 min  Past Medical History:  Past Medical History  Diagnosis Date  . Hypertension   . Diabetes mellitus without complication   . Arthritis   . Sciatica   . GERD (gastroesophageal reflux disease)   . Urge incontinence   . Vertigo   . Hyperlipidemia   . ED (erectile dysfunction)   . Gastric polyposis   . Osteoporosis   . Decreased libido   . Depression   . Hepatitis A   . Stroke   . History of blood transfusion     1946  . OA (osteoarthritis)   . Bladder cancer   . Prostate cancer     S/P prostatectomy; Lung metastasis  . CAD (coronary artery disease) 1996    stent to the LAD   . Hyperlipidemia   . Type 2 diabetes mellitus    Past Surgical History:  Past Surgical History  Procedure Laterality Date  . Carpal tunnel release    . Cataract extraction w/ intraocular lens  implant, bilateral   1998  . Prostatectomy    . Urinary sphincter implant    . Urinary sphincter implant revision    .  Appendectomy    . Left hip surgery      Donated bone for bone graft to arm  . Penile prosthesis implant      S/P removal and re-implantation of new prosthesis  . Middle ear surgery    . Finger surgery      Left and right  . Hernia repair    . Bladder surgery    . Right arm bone graft      Pathological fracture  . Cardiac catheterization  11/27/2001    patent stent with mild in-stent restenosis.  . Myoview  06/12/2009    Persantine myoview EF 76%; Normal myoview  . Coronary angioplasty  1996    stenting to the LAD in [place].    HPI:  80 yo male with unwitnessed fall.  Caregiver immediately to pt's  side and denies LOC.      Assessment / Plan / Recommendation Clinical Impression  Dysphagia Diagnosis: Moderate pharyngeal phase dysphagia,  Moderate cervical esophageal phase dysphagia Clinical impression: Patient presents with a multifactorial  dysphagia with both pharyngeal and esophageal components. Patient  with mild base of tongue and laryngeal weakness, which when  combined with decreased UES relaxation due to suspected decreased  esophageal motility (? stasis noted during esophageal sweep),  results in decreased airway protection during and after the  swallow. Penetration/aspiration intermittent and minimized with  cueing for small sips. Additionally, cued throat clear assisted  to clear penetrates from laryngeal vestibule. A chin tuck was not  only ineffective for improving airway protection but actually  increased pt's incidence of aspiration after the swallow due to  residual thin and thick liquids spilling from the pyriforms into  the airway. ?Although aspiration risk remains moderate with all  liquids, residue greater with nectar thick vs thin liquids and  use of precautions do decrease risks. Recommend a dys. 3 diet  with thin liquids with strict use of precautions. Education  complete with patient and wife.     Treatment Recommendation  Defer treatment plan to SLP at (home health agency)    Diet  Recommendation Dysphagia 3 (Mechanical Soft)   Liquid Administration via: Cup Medication Administration: Whole meds with puree Supervision: Patient able to self feed;Full supervision/cueing  for compensatory strategies Compensations: Slow rate;Small sips/bites;Clear throat  intermittently Postural Changes and/or Swallow Maneuvers: Out of bed for  meals;Seated upright 90 degrees;Upright 30-60 min after meal    Other  Recommendations Recommended Consults: Consider esophageal  assessment Oral Care Recommendations: Oral care BID   Follow Up Recommendations  Home health SLP       Pertinent Vitals/Pain n/a      General Date of Onset: 02/08/14 HPI: 80 yo male with unwitnessed fall.  Caregiver immediately to  pt's side and denies LOC.  Type of Study: Modified Barium Swallowing Study Reason for Referral: Objectively evaluate swallowing function Previous Swallow Assessment: n/a Diet Prior to this Study: Regular;Thin liquids Temperature Spikes Noted: No Respiratory Status: Room air History of Recent Intubation: No Behavior/Cognition: Alert;Cooperative;Pleasant mood Oral Cavity - Dentition: Missing dentition;Adequate natural  dentition Oral Motor / Sensory Function: Within functional limits Self-Feeding Abilities: Able to feed self Patient Positioning: Upright in chair Baseline Vocal Quality: Low vocal intensity Volitional Cough: Strong Anatomy: Other (Comment) (C4-C5 bony prominence) Pharyngeal Secretions: Normal    Reason for Referral Objectively evaluate swallowing function   Oral Phase Oral Preparation/Oral Phase Oral Phase: WFL   Pharyngeal Phase Pharyngeal Phase Pharyngeal Phase: Impaired Pharyngeal - Nectar Pharyngeal - Nectar Cup: Reduced anterior laryngeal  mobility;Premature spillage to valleculae;Reduced tongue base  retraction;Pharyngeal residue - pyriform  sinuses;Penetration/Aspiration during swallow;Delayed swallow  initiation;Reduced airway/laryngeal closure;Reduced laryngeal  elevation Penetration/Aspiration  details (nectar cup): Material enters  airway, CONTACTS cords then ejected out Pharyngeal - Thin Pharyngeal - Thin Cup: Moderate aspiration;Penetration/Aspiration  during swallow;Penetration/Aspiration after swallow;Reduced  airway/laryngeal closure;Reduced laryngeal elevation;Reduced  anterior laryngeal mobility;Premature spillage to  valleculae;Delayed swallow initiation Penetration/Aspiration details (thin cup): Material enters  airway, CONTACTS cords then ejected out;Material enters airway,  passes BELOW cords and not ejected out despite cough attempt by  patient Pharyngeal - Solids Pharyngeal - Puree: Reduced anterior laryngeal mobility;Delayed  swallow initiation;Premature spillage to valleculae;Reduced  tongue base retraction;Pharyngeal residue - valleculae;Pharyngeal  residue - pyriform sinuses;Reduced laryngeal elevation Pharyngeal - Regular: Within functional limits  Cervical Esophageal Phase    GO    Cervical Esophageal Phase Cervical Esophageal Phase: Impaired Cervical Esophageal Phase - Nectar Nectar Cup: Reduced cricopharyngeal relaxation Cervical Esophageal Phase - Thin Thin Cup: Reduced cricopharyngeal relaxation Cervical Esophageal Phase - Solids Puree: Reduced cricopharyngeal relaxation Regular: Within functional limits    Functional Assessment Tool Used: clinical judgment Functional Limitations: Swallowing Swallow Current Status (Z6010): At least 40 percent but less than  60 percent impaired, limited or restricted Swallow Goal Status [phone]): At least 40 percent but less than 60  percent impaired, limited or restricted Swallow Discharge Status [phone]): At least 40 percent but less  than 60 percent impaired, limited or restricted       Windell Moulding, M.A. CCC-SLP  Selinda Orion Page 02/24/2014, 4:58 PM  Pulse oximetry 99% room air normal   2:30 PM Patient smiling. He has a sling at home and will apply it for his clavicle  Family not specified the patient was able to stand yesterday after the  fall. Patient has home health assistance, wheelchair at home, multiple walkers, will followup with primary care MDM   Patient presents one after a fall with ongoing pain in multiple areas.  Patient's x-rays demonstrate only clavicular fracture, and throughout his emergency department evaluation he smiling, interactive, in no distress.  Patient's history of frequent falls.  Patient borderline ambulatory at baseline.  Patient was discharged stable condition to his home with home health assistance, primary care followup.  Carmin Muskrat, MD 02/25/14 [phone]

## 2014-02-25 NOTE — ED Notes (Signed)
Pt awaiting xray

## 2014-02-25 NOTE — Discharge Instructions (Signed)
As discussed, your evaluation today has been largely reassuring.  But, it is important that you monitor your condition carefully, and do not hesitate to return to the ED if you develop new, or concerning changes in your condition.  Please use your sling for your left arm, upon your return home.  Otherwise, please follow-up with your physician for appropriate ongoing care.

## 2014-02-28 ENCOUNTER — Telehealth (HOSPITAL_COMMUNITY): Payer: Self-pay

## 2014-03-02 ENCOUNTER — Ambulatory Visit (HOSPITAL_COMMUNITY)
Admission: RE | Admit: 2014-03-02 | Discharge: 2014-03-02 | Disposition: A | Discharge status: Home / Self Care | Payer: Medicare Other | Source: Ambulatory Visit | Attending: Cardiology | Admitting: Cardiology

## 2014-03-02 DIAGNOSIS — R079 Chest pain, unspecified: Secondary | ICD-10-CM | POA: Insufficient documentation

## 2014-03-02 DIAGNOSIS — R5383 Other fatigue: Secondary | ICD-10-CM

## 2014-03-02 DIAGNOSIS — R42 Dizziness and giddiness: Secondary | ICD-10-CM | POA: Insufficient documentation

## 2014-03-02 DIAGNOSIS — Z794 Long term (current) use of insulin: Secondary | ICD-10-CM | POA: Insufficient documentation

## 2014-03-02 DIAGNOSIS — E119 Type 2 diabetes mellitus without complications: Secondary | ICD-10-CM | POA: Insufficient documentation

## 2014-03-02 DIAGNOSIS — R0609 Other forms of dyspnea: Secondary | ICD-10-CM | POA: Insufficient documentation

## 2014-03-02 DIAGNOSIS — I10 Essential (primary) hypertension: Secondary | ICD-10-CM | POA: Insufficient documentation

## 2014-03-02 DIAGNOSIS — R5381 Other malaise: Secondary | ICD-10-CM | POA: Insufficient documentation

## 2014-03-02 DIAGNOSIS — R0989 Other specified symptoms and signs involving the circulatory and respiratory systems: Secondary | ICD-10-CM | POA: Insufficient documentation

## 2014-03-02 DIAGNOSIS — R0602 Shortness of breath: Secondary | ICD-10-CM | POA: Insufficient documentation

## 2014-03-02 MED ORDER — REGADENOSON 0.4 MG/5ML IV SOLN
0.4000 mg | Freq: Once | INTRAVENOUS | Status: AC
  Administered 2014-03-02: 0.4 mg via INTRAVENOUS

## 2014-03-02 MED ORDER — TECHNETIUM TC 99M SESTAMIBI GENERIC - CARDIOLITE
30.7000 | Freq: Once | INTRAVENOUS | Status: AC | PRN
  Administered 2014-03-02: 30.7 via INTRAVENOUS

## 2014-03-02 MED ORDER — AMINOPHYLLINE 25 MG/ML IV SOLN
75.0000 mg | Freq: Once | INTRAVENOUS | Status: AC
  Administered 2014-03-02: 75 mg via INTRAVENOUS

## 2014-03-02 MED ORDER — TECHNETIUM TC 99M SESTAMIBI GENERIC - CARDIOLITE
10.5000 | Freq: Once | INTRAVENOUS | Status: AC | PRN
  Administered 2014-03-02: 11 via INTRAVENOUS

## 2014-03-02 NOTE — Procedures (Addendum)
[facility] [address] [place] [address] [phone]  Cardiology Nuclear Med Study  Guy [name] is a 80 y.o. male     MRN : [id number]     DOB: 09-25-1920  Procedure Date: 03/02/2014  Nuclear Med Background Indication for Stress Test:  Surgical Clearance History:  COPD and CAD;MI-1996;STENT/PTCA-1996;Last NUC MPI on 06/12/2009-nonischemic;EF=76% Cardiac Risk Factors: Family History - CAD, History of Smoking, Hypertension, IDDM Type 2 and Lipids  Symptoms:  Chest Pain, Dizziness, DOE, Fatigue, Light-Headedness and SOB   Nuclear Pre-Procedure Caffeine/Decaff Intake:  12:00am NPO After: 10am   IV Site: R Forearm  IV 0.9% NS with Angio Cath:  22g  Chest Size (in):  34"  IV Started by: Rolene Course, RN  Height: 5\' 6"  (1.676 m)  Cup Size: n/a  BMI:  Body mass index is 20.51 kg/(m^2). Weight:  127 lb (57.607 kg)   Tech Comments:  n/a    Nuclear Med Study 1 or 2 day study: 1 day  Stress Test Type:  Francis Provider:  Quay Burow, MD   Resting Radionuclide: Technetium 3m Sestamibi  Resting Radionuclide Dose: 10.5 mCi   Stress Radionuclide:  Technetium 48m Sestamibi  Stress Radionuclide Dose: 30.7 mCi           Stress Protocol Rest HR: 73 Stress HR: 86  Rest BP: 154/75 Stress BP: 159/68  Exercise Time (min): n/a METS: n/a          Dose of Adenosine (mg):  n/a Dose of Lexiscan: 0.4 mg  Dose of Atropine (mg): n/a Dose of Dobutamine: n/a mcg/kg/min (at max HR)  Stress Test Technologist: Mellody Memos, CCT Nuclear Technologist: Imagene Riches, CNMT   Rest Procedure:  Myocardial perfusion imaging was performed at rest 45 minutes following the intravenous administration of Technetium 32m Sestamibi. Stress Procedure:  The patient received IV Lexiscan 0.4 mg over 15-seconds.  Technetium 49m Sestamibi injected IV at 30-seconds.  Patient experienced shortness of breath and was administered 75 mg  of Aminophylline IV at 5 minutes. There were no significant changes with Lexiscan.  Quantitative spect images were obtained after a 45 minute delay.  Transient Ischemic Dilatation (Normal <1.22):  1.07 Lung/Heart Ratio (Normal <0.45):  n/a QGS EDV:  73 ml QGS ESV:  42 ml LV Ejection Fraction: 43%        Rest ECG: NSR-RBBB  Stress ECG: No significant change from baseline ECG  QPS Raw Data Images:  Normal; no motion artifact; normal heart/lung ratio. Stress Images:   Mild diaphragmatic attenuation versus a small in size and mild in intensity basal inferolateral defect with otherwise normal uptake in other regions. Rest Images:  More pronounced diaphragmatic attenuation Subtraction (SDS):  No evidence of ischemia.  Impression Exercise Capacity:  Lexiscan with no exercise. BP Response:  Normal blood pressure response. Clinical Symptoms:  No significant symptoms noted. ECG Impression:  No significant ST segment change suggestive of ischemia. Comparison with Prior Nuclear Study: Diaphragmatic attenuation is more pronounced  Overall Impression:  Low risk stress nuclear study demonstrating diaphragmatic attenuation but a small region of basal inferolateral nontransmural scar cannot be excluded.  LV Wall Motion:  Mild LV dysfunction with mild apical septal wall motion abnormality which may be contributed by the patient's RBBB conduction abnormality.   Troy Sine, MD  03/02/2014 7:39 PM

## 2014-03-09 ENCOUNTER — Encounter: Payer: Self-pay | Admitting: Cardiovascular Disease

## 2014-03-09 ENCOUNTER — Ambulatory Visit (INDEPENDENT_AMBULATORY_CARE_PROVIDER_SITE_OTHER): Payer: Medicare Other | Admitting: Cardiovascular Disease

## 2014-03-09 VITALS — BP 153/75 | HR 64 | Ht 66.0 in | Wt 117.0 lb

## 2014-03-09 DIAGNOSIS — E785 Hyperlipidemia, unspecified: Secondary | ICD-10-CM

## 2014-03-09 DIAGNOSIS — I2510 Atherosclerotic heart disease of native coronary artery without angina pectoris: Secondary | ICD-10-CM

## 2014-03-09 NOTE — Patient Instructions (Signed)
You are cleared to proceed with your dental procedure for implants.  We request that you follow-up in: 6 months with Guy [name] and in 1 year with Dr Andria Rhein will receive a reminder letter in the mail two months in advance. If you don't receive a letter, please call our office to schedule the follow-up appointment.

## 2014-03-09 NOTE — Assessment & Plan Note (Signed)
On statin therapy followed by his PCP. His last lipid profile in our system one year ago revealed a total cholesterol of 80, LDL of 37 and HDL of 26

## 2014-03-09 NOTE — Progress Notes (Signed)
03/09/2014 Guy [name]   August 19, 1920  [id number]  Primary Physician [name], MD Primary Cardiologist: Lorretta Harp MD [name]   HPI:  The patient is a very pleasant 80 year old thin and frail appearing married Caucasian male, father of 40, grandfather of 4 great grandchildren who is accompanied by his wife today who is also a patient of mine. I last saw him 3 years ago. He has a history of CAD status post remote LAD stenting in 1996 in [place]. I catheterized him November 27, 2001 revealing a patent LAD stent with mild in-stent restenosis. His last Myoview performed in March of 2008 was nonischemic, as was one on June 12, 2009. His other problems include hypertension, hyperlipidemia, and non-insulin-requiring diabetes. He does have metastatic prostate cancer involving his lungs and ribs and has had multiple operations for these. He gets occasionally chest pain which is nitrate responsive. Since I saw him last he had hospitalizations for fractured vertebrae requiring kyphoplasty as well as urinary tract infection and surgery on his urethra.  I last saw him in September 2014. He has sustained multiple falls and has metastatic prostate cancer. Otherwise, there is no change in his clinical condition. He does have some dental issues in this has affected his ability to eat and contribute to his weight loss.    Current Outpatient Prescriptions  Medication Sig Dispense Refill  . amLODipine (NORVASC) 10 MG tablet Take 5 mg by mouth daily.       [name] aspirin 81 MG tablet Take 81 mg by mouth daily.      . bicalutamide (CASODEX) 50 MG tablet Take 50 mg by mouth daily.      . Calcium Carbonate-Vitamin D (CALCIUM 600+D HIGH POTENCY) 600-400 MG-UNIT per tablet Take 1 tablet by mouth 2 (two) times daily.        [name] CRANBERRY CONCENTRATE PO Take 4 capsules by mouth daily.      . fentaNYL (DURAGESIC - DOSED MCG/HR) 25 MCG/HR patch Place 25 mcg onto the skin every 3 (three)  days.      . fluticasone (FLONASE) 50 MCG/ACT nasal spray Place 2 sprays into the nose 2 (two) times daily.      [name] HYDROcodone-acetaminophen (NORCO/VICODIN) 5-325 MG per tablet Take 1 tablet by mouth every 6 (six) hours as needed for moderate pain.      . hydrOXYzine (ATARAX/VISTARIL) 25 MG tablet Take 25 mg by mouth 3 (three) times daily as needed.      . insulin aspart (NOVOLOG) 100 UNIT/ML injection Inject 0-6 Units into the skin 3 (three) times daily with meals. 0-150=0; 151-200=2u; 201-250=3u; 251-300=4u; 301-350=5u; over 350 =6u prior to meals. With breakfast and lunch.      . insulin glargine (LANTUS) 100 UNIT/ML injection Inject 28 Units into the skin at bedtime.       . lipase/protease/amylase (CREON-12/PANCREASE) 12000 UNITS CPEP Take 2 capsules by mouth 3 (three) times daily.       . Melatonin 5 MG TABS Take 5 mg by mouth at bedtime.      . metoprolol succinate (TOPROL-XL) 25 MG 24 hr tablet Take 12.5 mg by mouth daily.      . Multiple Vitamins-Minerals (MULTIVITAMINS THER. W/MINERALS) TABS Take 1 tablet by mouth every morning.       . nitroGLYCERIN (NITROSTAT) 0.4 MG SL tablet Place 0.4 mg under the tongue.      [name] omeprazole (PRILOSEC) 20 MG capsule Take 20 mg by mouth 2 (two) times daily  before a meal.      . oxybutynin (DITROPAN) 5 MG tablet Take 5 mg by mouth daily.      . sertraline (ZOLOFT) 100 MG tablet Take 100 mg by mouth 2 (two) times daily.       . simvastatin (ZOCOR) 20 MG tablet Take 10 mg by mouth every other day.       No current facility-administered medications for this visit.    Allergies  Allergen Reactions  . Albuterol Sulfate Hfa [Kdc:Albuterol] Shortness Of Breath and Swelling  . Adhesive [Tape] Other (See Comments)    REACTION: SKIN BLISTERS  . Albuterol Swelling    Per pt., side of his neck began to swell with nebulized Albuterol in doctor's office.  . Contrast Media [Iodinated Diagnostic Agents] Other (See Comments)    Reaction unknown  . Lactose      Other reaction(s): GI Upset (intolerance)  . Metrizamide Other (See Comments)    Reaction unknown  . Penicillins Other (See Comments) and Itching    REACTION: ITCHING HANDS  . Sulfa Drugs Cross Reactors Hives  . Budesonide-Formoterol Fumarate Rash  . Sulfamethoxazole Rash    History   Social History  . Marital Status: Married    Spouse Name: [name]    Number of Children: 3  . Years of Education: N/A   Occupational History  . Business owner, Press photographer, retired    Social History Main Topics  . Smoking status: Former Research scientist (life sciences)  . Smokeless tobacco: Never Used  . Alcohol Use: No  . Drug Use: No  . Sexual Activity: No   Other Topics Concern  . Not on file   Social History Narrative   Married.  Lives with his wife.  Ambulates with a walker and a cane.     Review of Systems: General: negative for chills, fever, night sweats or weight changes.  Cardiovascular: negative for chest pain, dyspnea on exertion, edema, orthopnea, palpitations, paroxysmal nocturnal dyspnea or shortness of breath Dermatological: negative for rash Respiratory: negative for cough or wheezing Urologic: negative for hematuria Abdominal: negative for nausea, vomiting, diarrhea, bright red blood per rectum, melena, or hematemesis Neurologic: negative for visual changes, syncope, or dizziness All other systems reviewed and are otherwise negative except as noted above.    Blood pressure 153/75, pulse 64, height 5\' 6"  (1.676 m), weight 117 lb (53.071 kg).  General appearance: alert and no distress Neck: no adenopathy, no carotid bruit, no JVD, supple, symmetrical, trachea midline and thyroid not enlarged, symmetric, no tenderness/mass/nodules Lungs: clear to auscultation bilaterally Heart: regular rate and rhythm, S1, S2 normal, no murmur, click, rub or gallop Extremities: extremities normal, atraumatic, no cyanosis or edema  EKG not performed today  ASSESSMENT AND PLAN:   Hyperlipidemia On statin therapy  followed by his PCP. His last lipid profile in our system one year ago revealed a total cholesterol of 80, LDL of 37 and HDL of 26  CAD (coronary artery disease) History of CAD status post remote LAD stenting in 1996 in [place]. I catheterized him 11/19/2001 revealing patent LAD stent with mild in-stent restenosis. He has infrequent nitroglycerin responsive chest pain. He had a recent Myoview which was low risk and nonischemic.      Lorretta Harp MD FACP,FACC,FAHA, [facility] 03/09/2014 2:34 PM

## 2014-03-09 NOTE — Assessment & Plan Note (Signed)
History of CAD status post remote LAD stenting in 1996 in [place]. I catheterized him 11/19/2001 revealing patent LAD stent with mild in-stent restenosis. He has infrequent nitroglycerin responsive chest pain. He had a recent Myoview which was low risk and nonischemic.

## 2014-03-15 ENCOUNTER — Other Ambulatory Visit: Payer: Self-pay | Admitting: Orthopedic Surgery

## 2014-03-15 DIAGNOSIS — M25559 Pain in unspecified hip: Secondary | ICD-10-CM

## 2014-03-21 ENCOUNTER — Encounter (HOSPITAL_BASED_OUTPATIENT_CLINIC_OR_DEPARTMENT_OTHER): Payer: Medicare Other | Attending: Plastic Surgery

## 2014-03-21 DIAGNOSIS — L988 Other specified disorders of the skin and subcutaneous tissue: Secondary | ICD-10-CM | POA: Insufficient documentation

## 2014-03-21 DIAGNOSIS — C61 Malignant neoplasm of prostate: Secondary | ICD-10-CM | POA: Insufficient documentation

## 2014-03-21 DIAGNOSIS — Z87891 Personal history of nicotine dependence: Secondary | ICD-10-CM | POA: Insufficient documentation

## 2014-03-21 DIAGNOSIS — F329 Major depressive disorder, single episode, unspecified: Secondary | ICD-10-CM | POA: Insufficient documentation

## 2014-03-21 DIAGNOSIS — F3289 Other specified depressive episodes: Secondary | ICD-10-CM | POA: Insufficient documentation

## 2014-03-21 DIAGNOSIS — I2510 Atherosclerotic heart disease of native coronary artery without angina pectoris: Secondary | ICD-10-CM | POA: Insufficient documentation

## 2014-03-21 DIAGNOSIS — E785 Hyperlipidemia, unspecified: Secondary | ICD-10-CM | POA: Insufficient documentation

## 2014-03-21 DIAGNOSIS — Z8551 Personal history of malignant neoplasm of bladder: Secondary | ICD-10-CM | POA: Insufficient documentation

## 2014-03-23 ENCOUNTER — Ambulatory Visit
Admission: RE | Admit: 2014-03-23 | Discharge: 2014-03-23 | Disposition: A | Discharge status: Home / Self Care | Payer: Medicare Other | Source: Ambulatory Visit | Attending: Orthopedic Surgery | Admitting: Orthopedic Surgery

## 2014-03-23 DIAGNOSIS — M25559 Pain in unspecified hip: Secondary | ICD-10-CM

## 2014-03-23 NOTE — Consult Note (Signed)
Guy [name], Guy [name] NO.:  [id number]  MEDICAL RECORD NO.:  [id number]  LOCATION:  FOOT                         FACILITY:  [facility]  PHYSICIAN:  Irene Limbo, MD   DATE OF BIRTH:  01-18-1920  DATE OF CONSULTATION:  03/21/2014 DATE OF DISCHARGE:                                CONSULTATION   CHIEF COMPLAINT:  Pain and discoloration over perineum and buttocks.  HISTORY OF PRESENT ILLNESS:  The patient is a 80 year old minimally ambulatory male with metastatic prostate cancer, and that is accompanied by his wife today for evaluation of his perineum and buttocks.  Patient history is significant for metastatic prostate cancer with multiple recent falls that has limited his mobility further. He also has a history of chronic indwelling Foley catheter after urethral laceration. He has a history of prior penile and sphincter implant.  Presently, patient sleeps on a regular bed at home.  They do have daily nursing visits supplied through the [facility].  The patient has previously had a hospital bed within his house, but they returned it as he was not comfortable within it.  He spends most of his day on the couch on which he sits on a pillow.  They report he does have some episodes of leakage around the Foley catheter.  He is not often incontinent of stool.  He wears pull ups presently.  The wife reports that they have tried multiple topical treatments for red discoloration present over his buttocks and perineum.  There has been no wound present beneath.  PAST MEDICAL HISTORY:  Includes as per HPI, metastatic prostate cancer, with recent fracture of clavicle and vertebrae.  In addition, patient has history of depression, osteoarthritis, bladder cancer, coronary artery disease, type 2 diabetes mellitus, and hyperlipidemia.  PAST SURGICAL HISTORY:  Includes prostatectomy, urinary sphincter implant with revision, bladder surgery, cardiac catheterization and angioplasty,  carpal tunnel release, and cataract extractions.  SOCIAL HISTORY:  The patient is a former smoker.  Currently, he lives with his wife at home.  He has had recent rehab stay.  PHYSICAL EXAMINATION:  VITAL SIGNS:  Blood pressure is 151/79, pulse is 60, temperature is 98.4, blood sugar is 171.  Examination reveals soft abdomen, over the perineum and buttocks, there is a red to dark purple color that is present.  There are no open wounds.  The areas of discoloration blanch with pressure.  The patient is overall quite thin and cachectic.  ASSESSMENT:  Buttock and perineal discoloration in the setting of minimal mobility, chronic indwelling Foley, and metastatic cancer.  The majority of our time today was spent discussing the patient's risk factors for pressure ulcerations.  I feel that in part, the concerns for discoloration over his buttocks and perineum are secondary to pressure. We discussed possible offloading mattress for home or onlay mattress. We will hold off on this presently as the patient has had recent hospital bed in the house which he did not utilize.  We also discussed the role of moisture and maceration in the area.  Overall, this appears to be well controlled, recommended placement of zinc and barrier cream over the entire area.  We discussed leaving the area  open to the air  following showering or bathing.  I did provide prescription for triamcinolone to try for 10 days to see whether this has any type of fungal component to it.  We will plan for recheck of the patient in 3 weeks time.          ______________________________ Irene Limbo, MD     BT/MEDQ  D:  03/22/2014  T:  03/23/2014  Job:  [id number]

## 2014-03-31 NOTE — Telephone Encounter (Signed)
Encounter complete. 

## 2014-04-02 ENCOUNTER — Inpatient Hospital Stay (HOSPITAL_COMMUNITY)
Admission: EM | Admit: 2014-04-02 | Discharge: 2014-04-07 | DRG: 871 | Disposition: A | Discharge status: Skilled Nursing Facility | Payer: Medicare Other | Attending: Internal Medicine | Admitting: Internal Medicine

## 2014-04-02 ENCOUNTER — Encounter (HOSPITAL_COMMUNITY): Payer: Self-pay | Admitting: Emergency Medicine

## 2014-04-02 ENCOUNTER — Emergency Department (HOSPITAL_COMMUNITY): Payer: Medicare Other

## 2014-04-02 DIAGNOSIS — D65 Disseminated intravascular coagulation [defibrination syndrome]: Secondary | ICD-10-CM

## 2014-04-02 DIAGNOSIS — Z8052 Family history of malignant neoplasm of bladder: Secondary | ICD-10-CM

## 2014-04-02 DIAGNOSIS — Z9079 Acquired absence of other genital organ(s): Secondary | ICD-10-CM

## 2014-04-02 DIAGNOSIS — I2583 Coronary atherosclerosis due to lipid rich plaque: Secondary | ICD-10-CM

## 2014-04-02 DIAGNOSIS — C800 Disseminated malignant neoplasm, unspecified: Secondary | ICD-10-CM

## 2014-04-02 DIAGNOSIS — I10 Essential (primary) hypertension: Secondary | ICD-10-CM

## 2014-04-02 DIAGNOSIS — E162 Hypoglycemia, unspecified: Secondary | ICD-10-CM

## 2014-04-02 DIAGNOSIS — E785 Hyperlipidemia, unspecified: Secondary | ICD-10-CM | POA: Diagnosis present

## 2014-04-02 DIAGNOSIS — D72829 Elevated white blood cell count, unspecified: Secondary | ICD-10-CM

## 2014-04-02 DIAGNOSIS — Z801 Family history of malignant neoplasm of trachea, bronchus and lung: Secondary | ICD-10-CM

## 2014-04-02 DIAGNOSIS — D638 Anemia in other chronic diseases classified elsewhere: Secondary | ICD-10-CM

## 2014-04-02 DIAGNOSIS — I4510 Unspecified right bundle-branch block: Secondary | ICD-10-CM

## 2014-04-02 DIAGNOSIS — N390 Urinary tract infection, site not specified: Secondary | ICD-10-CM

## 2014-04-02 DIAGNOSIS — R1310 Dysphagia, unspecified: Secondary | ICD-10-CM | POA: Diagnosis present

## 2014-04-02 DIAGNOSIS — R778 Other specified abnormalities of plasma proteins: Secondary | ICD-10-CM

## 2014-04-02 DIAGNOSIS — C7800 Secondary malignant neoplasm of unspecified lung: Secondary | ICD-10-CM | POA: Diagnosis present

## 2014-04-02 DIAGNOSIS — Z800 Family history of malignant neoplasm of digestive organs: Secondary | ICD-10-CM

## 2014-04-02 DIAGNOSIS — C61 Malignant neoplasm of prostate: Secondary | ICD-10-CM

## 2014-04-02 DIAGNOSIS — G9341 Metabolic encephalopathy: Secondary | ICD-10-CM | POA: Diagnosis present

## 2014-04-02 DIAGNOSIS — N179 Acute kidney failure, unspecified: Secondary | ICD-10-CM

## 2014-04-02 DIAGNOSIS — IMO0002 Reserved for concepts with insufficient information to code with codable children: Secondary | ICD-10-CM

## 2014-04-02 DIAGNOSIS — Z8546 Personal history of malignant neoplasm of prostate: Secondary | ICD-10-CM

## 2014-04-02 DIAGNOSIS — K219 Gastro-esophageal reflux disease without esophagitis: Secondary | ICD-10-CM | POA: Diagnosis present

## 2014-04-02 DIAGNOSIS — R6520 Severe sepsis without septic shock: Secondary | ICD-10-CM

## 2014-04-02 DIAGNOSIS — I2510 Atherosclerotic heart disease of native coronary artery without angina pectoris: Secondary | ICD-10-CM

## 2014-04-02 DIAGNOSIS — R7989 Other specified abnormal findings of blood chemistry: Secondary | ICD-10-CM

## 2014-04-02 DIAGNOSIS — E876 Hypokalemia: Secondary | ICD-10-CM | POA: Diagnosis present

## 2014-04-02 DIAGNOSIS — Z803 Family history of malignant neoplasm of breast: Secondary | ICD-10-CM

## 2014-04-02 DIAGNOSIS — Z87311 Personal history of (healed) other pathological fracture: Secondary | ICD-10-CM

## 2014-04-02 DIAGNOSIS — E119 Type 2 diabetes mellitus without complications: Secondary | ICD-10-CM

## 2014-04-02 DIAGNOSIS — Z8673 Personal history of transient ischemic attack (TIA), and cerebral infarction without residual deficits: Secondary | ICD-10-CM

## 2014-04-02 DIAGNOSIS — H9190 Unspecified hearing loss, unspecified ear: Secondary | ICD-10-CM | POA: Diagnosis present

## 2014-04-02 DIAGNOSIS — I2489 Other forms of acute ischemic heart disease: Secondary | ICD-10-CM

## 2014-04-02 DIAGNOSIS — I5032 Chronic diastolic (congestive) heart failure: Secondary | ICD-10-CM | POA: Diagnosis present

## 2014-04-02 DIAGNOSIS — Z9849 Cataract extraction status, unspecified eye: Secondary | ICD-10-CM

## 2014-04-02 DIAGNOSIS — R9431 Abnormal electrocardiogram [ECG] [EKG]: Secondary | ICD-10-CM

## 2014-04-02 DIAGNOSIS — E860 Dehydration: Secondary | ICD-10-CM

## 2014-04-02 DIAGNOSIS — Z66 Do not resuscitate: Secondary | ICD-10-CM | POA: Diagnosis present

## 2014-04-02 DIAGNOSIS — C7951 Secondary malignant neoplasm of bone: Secondary | ICD-10-CM | POA: Diagnosis present

## 2014-04-02 DIAGNOSIS — A419 Sepsis, unspecified organism: Principal | ICD-10-CM

## 2014-04-02 DIAGNOSIS — C7952 Secondary malignant neoplasm of bone marrow: Secondary | ICD-10-CM

## 2014-04-02 DIAGNOSIS — Z87891 Personal history of nicotine dependence: Secondary | ICD-10-CM

## 2014-04-02 DIAGNOSIS — E1169 Type 2 diabetes mellitus with other specified complication: Secondary | ICD-10-CM | POA: Diagnosis present

## 2014-04-02 DIAGNOSIS — I248 Other forms of acute ischemic heart disease: Secondary | ICD-10-CM

## 2014-04-02 DIAGNOSIS — Z8249 Family history of ischemic heart disease and other diseases of the circulatory system: Secondary | ICD-10-CM

## 2014-04-02 DIAGNOSIS — E440 Moderate protein-calorie malnutrition: Secondary | ICD-10-CM | POA: Diagnosis present

## 2014-04-02 HISTORY — DX: Abnormal result of cardiovascular function study, unspecified: R94.30

## 2014-04-02 HISTORY — DX: Reserved for concepts with insufficient information to code with codable children: IMO0002

## 2014-04-02 LAB — URINALYSIS, ROUTINE W REFLEX MICROSCOPIC
Bilirubin Urine: NEGATIVE
Glucose, UA: NEGATIVE mg/dL
Ketones, ur: NEGATIVE mg/dL
Nitrite: NEGATIVE
PROTEIN: 30 mg/dL — AB
Specific Gravity, Urine: 1.01 (ref 1.005–1.030)
UROBILINOGEN UA: 0.2 mg/dL (ref 0.0–1.0)
pH: 7 (ref 5.0–8.0)

## 2014-04-02 LAB — COMPREHENSIVE METABOLIC PANEL
ALT: 53 U/L (ref 0–53)
ANION GAP: 18 — AB (ref 5–15)
AST: 154 U/L — ABNORMAL HIGH (ref 0–37)
Albumin: 2.9 g/dL — ABNORMAL LOW (ref 3.5–5.2)
Alkaline Phosphatase: 280 U/L — ABNORMAL HIGH (ref 39–117)
BILIRUBIN TOTAL: 0.9 mg/dL (ref 0.3–1.2)
BUN: 36 mg/dL — AB (ref 6–23)
CO2: 18 mEq/L — ABNORMAL LOW (ref 19–32)
Calcium: 9.1 mg/dL (ref 8.4–10.5)
Chloride: 103 mEq/L (ref 96–112)
Creatinine, Ser: 1.2 mg/dL (ref 0.50–1.35)
GFR calc non Af Amer: 51 mL/min — ABNORMAL LOW (ref >90)
GFR, EST AFRICAN AMERICAN: 59 mL/min — AB (ref >90)
GLUCOSE: 198 mg/dL — AB (ref 70–99)
Potassium: 5.3 mEq/L (ref 3.7–5.3)
Sodium: 139 mEq/L (ref 137–147)
TOTAL PROTEIN: 7.5 g/dL (ref 6.0–8.3)

## 2014-04-02 LAB — CBC WITH DIFFERENTIAL/PLATELET
Basophils Absolute: 0 10*3/uL (ref 0.0–0.1)
Basophils Relative: 0 % (ref 0–1)
EOS ABS: 0 10*3/uL (ref 0.0–0.7)
Eosinophils Relative: 1 % (ref 0–5)
HEMATOCRIT: 41.2 % (ref 39.0–52.0)
HEMOGLOBIN: 13.8 g/dL (ref 13.0–17.0)
LYMPHS ABS: 0.1 10*3/uL — AB (ref 0.7–4.0)
Lymphocytes Relative: 2 % — ABNORMAL LOW (ref 12–46)
MCH: 32.1 pg (ref 26.0–34.0)
MCHC: 33.5 g/dL (ref 30.0–36.0)
MCV: 95.8 fL (ref 78.0–100.0)
MONO ABS: 0 10*3/uL — AB (ref 0.1–1.0)
MONOS PCT: 1 % — AB (ref 3–12)
Neutro Abs: 6.4 10*3/uL (ref 1.7–7.7)
Neutrophils Relative %: 96 % — ABNORMAL HIGH (ref 43–77)
Platelets: 123 10*3/uL — ABNORMAL LOW (ref 150–400)
RBC: 4.3 MIL/uL (ref 4.22–5.81)
RDW: 15.1 % (ref 11.5–15.5)
WBC: 6.6 10*3/uL (ref 4.0–10.5)

## 2014-04-02 LAB — PROTIME-INR
INR: 1.4 (ref 0.00–1.49)
Prothrombin Time: 17.2 seconds — ABNORMAL HIGH (ref 11.6–15.2)

## 2014-04-02 LAB — URINE MICROSCOPIC-ADD ON

## 2014-04-02 LAB — POC OCCULT BLOOD, ED: Fecal Occult Bld: NEGATIVE

## 2014-04-02 LAB — I-STAT CG4 LACTIC ACID, ED: Lactic Acid, Venous: 4.48 mmol/L — ABNORMAL HIGH (ref 0.5–2.2)

## 2014-04-02 MED ORDER — ACETAMINOPHEN 650 MG RE SUPP
650.0000 mg | Freq: Once | RECTAL | Status: AC
  Administered 2014-04-02: 650 mg via RECTAL
  Filled 2014-04-02: qty 1

## 2014-04-02 MED ORDER — DEXTROSE 5 % IV SOLN
2.0000 g | Freq: Once | INTRAVENOUS | Status: AC
  Administered 2014-04-02: 2 g via INTRAVENOUS
  Filled 2014-04-02: qty 2

## 2014-04-02 MED ORDER — DEXTROSE 5 % IV SOLN
1.0000 g | Freq: Two times a day (BID) | INTRAVENOUS | Status: DC
  Administered 2014-04-03: 1 g via INTRAVENOUS
  Filled 2014-04-02: qty 1

## 2014-04-02 MED ORDER — SODIUM CHLORIDE 0.9 % IV SOLN
1000.0000 mL | INTRAVENOUS | Status: DC
  Administered 2014-04-02: 400 mL via INTRAVENOUS

## 2014-04-02 MED ORDER — SODIUM CHLORIDE 0.9 % IV BOLUS (SEPSIS)
30.0000 mL/kg | Freq: Once | INTRAVENOUS | Status: AC
  Administered 2014-04-02: 1593 mL via INTRAVENOUS

## 2014-04-02 MED ORDER — VANCOMYCIN HCL IN DEXTROSE 750-5 MG/150ML-% IV SOLN
750.0000 mg | INTRAVENOUS | Status: DC
  Administered 2014-04-02: 750 mg via INTRAVENOUS
  Filled 2014-04-02: qty 150

## 2014-04-02 MED ORDER — PIPERACILLIN-TAZOBACTAM 3.375 G IVPB: 3.3750 g | Freq: Once | INTRAVENOUS | Status: DC

## 2014-04-02 NOTE — ED Notes (Signed)
Pt from home via [facility]-Per [facility] pt was sitting at home with wife at home watching tv, started to shake and became aphasic, unable to answer questions. Pt wife adds that pt was seen at dermatologist and dx with "grass mites" and is covered with bites. Dr Vanita Panda notified

## 2014-04-02 NOTE — Progress Notes (Signed)
ANTIBIOTIC CONSULT NOTE - INITIAL  Pharmacy Consult for Vancomycin and Cefepime Indication: Sepsis  Allergies  Allergen Reactions  . Albuterol Sulfate Hfa [Kdc:Albuterol] Shortness Of Breath and Swelling  . Adhesive [Tape] Other (See Comments)    REACTION: SKIN BLISTERS  . Albuterol Swelling    Per pt., side of his neck began to swell with nebulized Albuterol in doctor's office.  . Contrast Media [Iodinated Diagnostic Agents] Other (See Comments)    Reaction unknown  . Lactose     Other reaction(s): GI Upset (intolerance)  . Metrizamide Other (See Comments)    Reaction unknown  . Penicillins Other (See Comments) and Itching    REACTION: ITCHING HANDS  . Sulfa Drugs Cross Reactors Hives  . Budesonide-Formoterol Fumarate Rash  . Sulfamethoxazole Rash    Patient Measurements: Height: 5' 6.14" (168 cm) Weight: 117 lb (53.071 kg) IBW/kg (Calculated) : 64.13   Vital Signs: Temp: 103.5 F (39.7 C) (07/04 1904) Temp src: Rectal (07/04 1904) BP: 137/58 mmHg (07/04 2100) Pulse Rate: 75 (07/04 2100) Intake/Output from previous day:   Intake/Output from this shift:    Labs:  Recent Labs  04/02/14 1943  WBC 6.6  HGB 13.8  PLT 123*  CREATININE 1.20   Estimated Creatinine Clearance: 30.1 ml/min (by C-G formula based on Cr of 1.2). No results found for this basename: VANCOTROUGH, VANCOPEAK, VANCORANDOM, GENTTROUGH, GENTPEAK, GENTRANDOM, TOBRATROUGH, TOBRAPEAK, TOBRARND, AMIKACINPEAK, AMIKACINTROU, AMIKACIN,  in the last 72 hours   Microbiology: No results found for this or any previous visit (from the past 720 hour(s)).  Medical History: Past Medical History  Diagnosis Date  . Hypertension   . Diabetes mellitus without complication   . Arthritis   . Sciatica   . GERD (gastroesophageal reflux disease)   . Urge incontinence   . Vertigo   . Hyperlipidemia   . ED (erectile dysfunction)   . Gastric polyposis   . Osteoporosis   . Decreased libido   . Depression   .  Hepatitis A   . Stroke   . History of blood transfusion     1946  . OA (osteoarthritis)   . Bladder cancer   . Prostate cancer     S/P prostatectomy; Lung metastasis  . CAD (coronary artery disease) 1996    stent to the LAD   . Hyperlipidemia   . Type 2 diabetes mellitus     Medications:  Scheduled:  . vancomycin  750 mg Intravenous Q24H   Infusions:  . sodium chloride 400 mL (04/02/14 2001)  . [START ON 04/03/2014] ceFEPime (MAXIPIME) IV    . ceFEPime (MAXIPIME) IV 2 g (04/02/14 2119)   Assessment: 80 yo c/o fever, altered mental status Vancomycin and Cefepime for Sepsis.   Goal of Therapy:  Vancomycin trough level 15-20 mcg/ml  Plan:   Cefepime 2gm x1 then 1Gm IV q12h  Vancomycin 750mg  IV q24h  F/U scr/levels/cultures  Lawana Pai R 04/02/2014,9:33 PM

## 2014-04-02 NOTE — ED Notes (Signed)
Bed: RESA Expected date:  Expected time:  Means of arrival:  Comments: EMS/fever/ <L.O.C.

## 2014-04-02 NOTE — H&P (Signed)
PCP:  [name], MD    Chief Complaint:  Confusion, diminished speech  HPI: Guy [name] is a 80 y.o. male   has a past medical history of Hypertension; Diabetes mellitus without complication; Arthritis; Sciatica; GERD (gastroesophageal reflux disease); Urge incontinence; Vertigo; Hyperlipidemia; ED (erectile dysfunction); Gastric polyposis; Osteoporosis; Decreased libido; Depression; Hepatitis A; Stroke; History of blood transfusion; OA (osteoarthritis); Bladder cancer; Prostate cancer; CAD (coronary artery disease) (1996); Hyperlipidemia; and Type 2 diabetes mellitus.   Presented with  Patient unable to provide his own history. 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 As well as coronary artery disease and diabetes. Patient lives at home with his wife. EMS was called in because he was having right lower and having difficulty speaking. In emergency department patient initially was diagnosed with sepsis given fever up to 103 urine was noted to have evidence of urinary tract infection. Patient was started on cefepime and vancomycin given IV fluids and now improved. Currently appears to be comfortable. Hospitalist was called for admission for sepsis and altered mental status in the setting of UTI  Review of Systems:    Pertinent positives include: Fevers, chills, fatigue, weight loss trouble with  speech  Constitutional:  No weight loss, night sweats,   HEENT:  No headaches, Difficulty swallowing,Tooth/dental problems,Sore throat,  No sneezing, itching, ear ache, nasal congestion, post nasal drip,  Cardio-vascular:  No chest pain, Orthopnea, PND, anasarca, dizziness, palpitations.no Bilateral lower extremity swelling  GI:  No heartburn, indigestion, abdominal pain, nausea, vomiting, diarrhea, change in bowel habits, loss of appetite, melena, blood in stool, hematemesis Resp:  no shortness of breath at rest. No dyspnea on exertion, No excess  mucus, no productive cough, No non-productive cough, No coughing up of blood.No change in color of mucus.No wheezing. Skin:  no rash or lesions. No jaundice GU:  no dysuria, change in color of urine, no urgency or frequency. No straining to urinate.  No flank pain.  Musculoskeletal:  No joint pain or no joint swelling. No decreased range of motion. No back pain.  Psych:  No change in mood or affect. No depression or anxiety. No memory loss.  Neuro: no localizing neurological complaints, no tingling, no weakness, no double vision, no gait abnormality, no slurred speech, no confusion  Otherwise ROS are negative except for above, 10 systems were reviewed  Past Medical History: Past Medical History  Diagnosis Date  . Hypertension   . Diabetes mellitus without complication   . Arthritis   . Sciatica   . GERD (gastroesophageal reflux disease)   . Urge incontinence   . Vertigo   . Hyperlipidemia   . ED (erectile dysfunction)   . Gastric polyposis   . Osteoporosis   . Decreased libido   . Depression   . Hepatitis A   . Stroke   . History of blood transfusion     1946  . OA (osteoarthritis)   . Bladder cancer   . Prostate cancer     S/P prostatectomy; Lung metastasis  . CAD (coronary artery disease) 1996    stent to the LAD   . Hyperlipidemia   . Type 2 diabetes mellitus    Past Surgical History  Procedure Laterality Date  . Carpal tunnel release    . Cataract extraction w/ intraocular lens  implant, bilateral  1998  . Prostatectomy    . Urinary sphincter implant    . Urinary sphincter implant revision    . Appendectomy    .  Left hip surgery      Donated bone for bone graft to arm  . Penile prosthesis implant      S/P removal and re-implantation of new prosthesis  . Middle ear surgery    . Finger surgery      Left and right  . Hernia repair    . Bladder surgery    . Right arm bone graft      Pathological fracture  . Cardiac catheterization  11/27/2001    patent  stent with mild in-stent restenosis.  . Myoview  06/12/2009    Persantine myoview EF 76%; Normal myoview  . Coronary angioplasty  1996    stenting to the LAD in [place].      Medications: Prior to Admission medications   Medication Sig Start Date End Date Taking? Authorizing Provider  amLODipine (NORVASC) 10 MG tablet Take 5 mg by mouth daily.    Yes Historical Provider, MD  aspirin 81 MG tablet Take 81 mg by mouth daily.   Yes Historical Provider, MD  bicalutamide (CASODEX) 50 MG tablet Take 50 mg by mouth daily.   Yes Historical Provider, MD  Calcium Carbonate-Vitamin D (CALCIUM 600+D HIGH POTENCY) 600-400 MG-UNIT per tablet Take 1 tablet by mouth 2 (two) times daily.     Yes Historical Provider, MD  camphor-menthol Timoteo Ace) lotion Apply 1 application topically daily as needed for itching.   Yes Historical Provider, MD  CRANBERRY CONCENTRATE PO Take 4 capsules by mouth daily.   Yes Historical Provider, MD  fluticasone (FLONASE) 50 MCG/ACT nasal spray Place 2 sprays into the nose 2 (two) times daily.   Yes Historical Provider, MD  HYDROcodone-acetaminophen (NORCO/VICODIN) 5-325 MG per tablet Take 1 tablet by mouth at bedtime.  01/05/14  Yes Domenic Polite, MD  hydrOXYzine (ATARAX/VISTARIL) 25 MG tablet Take 25 mg by mouth 3 (three) times daily as needed for anxiety or itching.    Yes Historical Provider, MD  insulin aspart (NOVOLOG) 100 UNIT/ML injection Inject 0-6 Units into the skin 3 (three) times daily with meals. 0-150=0; 151-200=2u; 201-250=3u; 251-300=4u; 301-350=5u; over 350 =6u prior to meals. With breakfast and lunch. 01/18/13  Yes Gerlene Fee, NP  insulin glargine (LANTUS) 100 UNIT/ML injection Inject 28 Units into the skin at bedtime.    Yes Historical Provider, MD  ivermectin (STROMECTOL) 3 MG TABS tablet Take 12 mg by mouth once a week.   Yes Historical Provider, MD  lipase/protease/amylase (CREON-12/PANCREASE) 12000 UNITS CPEP Take 2 capsules by mouth 3 (three) times daily.     Yes Historical Provider, MD  Melatonin 5 MG TABS Take 5 mg by mouth at bedtime.   Yes Historical Provider, MD  metoprolol succinate (TOPROL-XL) 25 MG 24 hr tablet Take 12.5 mg by mouth daily.   Yes Historical Provider, MD  miconazole (MICOTIN) 2 % powder Apply 1 application topically daily as needed for itching.   Yes Historical Provider, MD  Multiple Vitamins-Minerals (MULTIVITAMINS THER. W/MINERALS) TABS Take 1 tablet by mouth every morning.    Yes Historical Provider, MD  omeprazole (PRILOSEC) 20 MG capsule Take 20 mg by mouth 2 (two) times daily before a meal.   Yes Historical Provider, MD  oxybutynin (DITROPAN) 5 MG tablet Take 5 mg by mouth daily.   Yes Historical Provider, MD  sertraline (ZOLOFT) 100 MG tablet Take 100 mg by mouth 2 (two) times daily.    Yes Historical Provider, MD  silver sulfADIAZINE (SILVADENE) 1 % cream Apply 1 application topically daily.   Yes  Historical Provider, MD  simvastatin (ZOCOR) 20 MG tablet Take 10 mg by mouth every other day.   Yes Historical Provider, MD  nitroGLYCERIN (NITROSTAT) 0.4 MG SL tablet Place 0.4 mg under the tongue.    Historical Provider, MD    Allergies:   Allergies  Allergen Reactions  . Albuterol Sulfate Hfa [Kdc:Albuterol] Shortness Of Breath and Swelling  . Adhesive [Tape] Other (See Comments)    REACTION: SKIN BLISTERS  . Albuterol Swelling    Per pt., side of his neck began to swell with nebulized Albuterol in doctor's office.  . Contrast Media [Iodinated Diagnostic Agents] Other (See Comments)    Reaction unknown  . Lactose     Other reaction(s): GI Upset (intolerance)  . Metrizamide Other (See Comments)    Reaction unknown  . Penicillins Other (See Comments) and Itching    REACTION: ITCHING HANDS  . Sulfa Drugs Cross Reactors Hives  . Budesonide-Formoterol Fumarate Rash  . Sulfamethoxazole Rash    Social History:   Lives at home With family    reports that he has quit smoking. He has never used smokeless  tobacco. He reports that he does not drink alcohol or use illicit drugs.    Family History: family history includes Bladder Cancer in his father; Breast cancer in his daughter; Heart failure in his mother; Liver disease in his son; Lung cancer in his sister; Pancreatic cancer in his sister.    Physical Exam: Patient Vitals for the past 24 hrs:  BP Temp Temp src Pulse Resp SpO2 Height Weight  04/02/14 2233 114/52 mmHg - - 75 22 95 % - -  04/02/14 2200 113/53 mmHg - - 76 31 93 % - -  04/02/14 2100 137/58 mmHg - - 75 25 91 % - -  04/02/14 2022 154/71 mmHg - - 83 26 96 % - -  04/02/14 1904 154/72 mmHg 103.5 F (39.7 C) Rectal 96 20 96 % 5' 6.14" (1.68 m) 53.071 kg (117 lb)  04/02/14 1859 - - - - - 97 % - -    1. General:  in No Acute distress 2. Psychological: Alert and Oriented tp  Self answers in a soft voice  3. Head/ENT:  Dry Mucous Membranes                          Head Non traumatic, neck supple                           Poor Dentition 4. SKIN:  decreased Skin turgor,  Skin clean Dry and intact no rash 5. Heart: Regular rate and rhythm systolic Murmur, Rub or gallop 6. Lungs: no wheezes occasional crackles   7. Abdomen: Soft, non-tender, Non distended 8. Lower extremities: no clubbing, cyanosis, or edema 9. Neurologically Grossly intact, moving all 4 extremities equally, not fully cooperative with exam 10. MSK: Normal range of motion  body mass index is 18.8 kg/(m^2).   Labs on Admission:   Recent Labs  04/02/14 1943  NA 139  K 5.3  CL 103  CO2 18*  GLUCOSE 198*  BUN 36*  CREATININE 1.20  CALCIUM 9.1    Recent Labs  04/02/14 1943  AST 154*  ALT 53  ALKPHOS 280*  BILITOT 0.9  PROT 7.5  ALBUMIN 2.9*   No results found for this basename: LIPASE, AMYLASE,  in the last 72 hours  Recent Labs  04/02/14 1943  WBC  6.6  NEUTROABS 6.4  HGB 13.8  HCT 41.2  MCV 95.8  PLT 123*   No results found for this basename: CKTOTAL, CKMB, CKMBINDEX, TROPONINI,  in  the last 72 hours No results found for this basename: TSH, T4TOTAL, FREET3, T3FREE, THYROIDAB,  in the last 72 hours No results found for this basename: VITAMINB12, FOLATE, FERRITIN, TIBC, IRON, RETICCTPCT,  in the last 72 hours Lab Results  Component Value Date   HGBA1C 7.4* 03/21/2013    Estimated Creatinine Clearance: 30.1 ml/min (by C-G formula based on Cr of 1.2). ABG    Component Value Date/Time   PHART 7.373 12/29/2012 1836   HCO3 20.4 12/29/2012 1836   TCO2 28 10/07/2013 1233   ACIDBASEDEF 4.0* 12/29/2012 1836   O2SAT 82.2 12/29/2012 2030     Lab Results  Component Value Date   DDIMER 8.67* 12/29/2012     Other results:  UA evidence of UTI in [place]  BNP (last 3 results)  Recent Labs  12/29/13 1120  PROBNP 2026.0*    Filed Weights   04/02/14 1904  Weight: 53.071 kg (117 lb)     Cultures:    Component Value Date/Time   SDES URINE, RANDOM 02/08/2014 1814   SPECREQUEST NONE 02/08/2014 1814   CULT  Value: ESCHERICHIA COLI Performed at [facility] 02/08/2014 1814   REPTSTATUS 02/10/2014 FINAL 02/08/2014 1814    Radiological Exams on Admission: Ct Head Wo Contrast  04/02/2014   CLINICAL DATA:  Altered mental status, tremor  EXAM: CT HEAD WITHOUT CONTRAST  TECHNIQUE: Contiguous axial images were obtained from the base of the skull through the vertex without intravenous contrast.  COMPARISON:  02/08/2014  FINDINGS: No evidence of parenchymal hemorrhage or extra-axial fluid collection. No mass lesion, mass effect, or midline shift.  No CT evidence of acute infarction.  Subcortical white matter and periventricular small vessel ischemic changes. Old left basal ganglia lacunar infarct.  Global cortical atrophy.  No ventriculomegaly.  Near complete opacification of the right maxillary sinus. Partial opacification of the left ethmoid sinus. Mastoid air cells are clear.  No evidence of calvarial fracture.  IMPRESSION: No evidence of acute intracranial abnormality.  Old left  basal ganglia lacunar infarct.  Atrophy with small vessel ischemic changes and intracranial atherosclerosis.   Electronically Signed   By: Julian Hy M.D.   On: 04/02/2014 19:54   Dg Chest Port 1 View  04/02/2014   CLINICAL DATA:  Fever and altered mental status  EXAM: PORTABLE CHEST - 1 VIEW  COMPARISON:  12/29/13  FINDINGS: Cardiac shadow is stable. The lungs are well aerated bilaterally. Diffuse interstitial changes are again identified and stable. No acute bony abnormality is seen. Old rib fractures are again noted. Old proximal right humeral fracture is seen as well.  IMPRESSION: Chronic changes without acute abnormality.   Electronically Signed   By: Inez Catalina M.D.   On: 04/02/2014 19:38    Chart has been reviewed  Assessment/Plan  80 year old gentleman with history of metastatic prostate cancer diabetes and coronary disease presents with altered mental status in the setting of UTI and sepsis  Present on Admission:   . Sepsis - currently vitals are stable continue the broad-spectrum antibiotics gentle IV fluids given history of diastolic heart failure. Most likely source is urine  . CAD (coronary artery disease) cycle cardiac cancer aunts and monitor on telemetry  . Dehydration administer gentle IV fluids  . DM (diabetes mellitus) sliding scale continue home medications  . Prostate cancer metastatic  to multiple sites - will need to the oncology noted that he is here in the morning  . UTI (lower urinary tract infection) coronary cefepime await results of urine culture Altered mental status with possible aphasia. So far no localizing neurological deficits. Patient has ongoing UTI and sepsis. Given history of metastatic prostate cancer we'll obtain MRI of the brain Possible aphasia currently patient able to communicate. Patient has history of dysphasia in the past. Will have speech therapy evaluation Prophylaxis: SCD, Protonix  CODE STATUS:  FULL CODE patient states he wished to be  full code  Other plan as per orders.  I have spent a total of 65 min on this admission due to complexity of medical decision making  [place] 04/02/2014, 10:37 PM  [facility]  Pager [phone]   If 7AM-7PM, please contact the day team taking care of the patient  [email or web address]  Password [email or web address]

## 2014-04-02 NOTE — ED Notes (Signed)
Pt at CT

## 2014-04-02 NOTE — ED Provider Notes (Signed)
CSN: [id number]     Arrival date & time    History   First MD Initiated Contact with Patient 04/02/14 1908     Chief Complaint  Patient presents with  . Fever  . Altered Mental Status     (Consider location/radiation/quality/duration/timing/severity/associated sxs/prior Treatment) HPI Patient presents from home with concern of altered mental status. The patient himself is taking minimally, but states that he is in a hospital. He cannot provide additional aspects of the history of present illness. level5 caveat.  Past Medical History  Diagnosis Date  . Hypertension   . Diabetes mellitus without complication   . Arthritis   . Sciatica   . GERD (gastroesophageal reflux disease)   . Urge incontinence   . Vertigo   . Hyperlipidemia   . ED (erectile dysfunction)   . Gastric polyposis   . Osteoporosis   . Decreased libido   . Depression   . Hepatitis A   . Stroke   . History of blood transfusion     1946  . OA (osteoarthritis)   . Bladder cancer   . Prostate cancer     S/P prostatectomy; Lung metastasis  . CAD (coronary artery disease) 1996    stent to the LAD   . Hyperlipidemia   . Type 2 diabetes mellitus    Past Surgical History  Procedure Laterality Date  . Carpal tunnel release    . Cataract extraction w/ intraocular lens  implant, bilateral  1998  . Prostatectomy    . Urinary sphincter implant    . Urinary sphincter implant revision    . Appendectomy    . Left hip surgery      Donated bone for bone graft to arm  . Penile prosthesis implant      S/P removal and re-implantation of new prosthesis  . Middle ear surgery    . Finger surgery      Left and right  . Hernia repair    . Bladder surgery    . Right arm bone graft      Pathological fracture  . Cardiac catheterization  11/27/2001    patent stent with mild in-stent restenosis.  . Myoview  06/12/2009    Persantine myoview EF 76%; Normal myoview  . Coronary angioplasty  1996    stenting to the L[place] in  [place].    Family History  Problem Relation Age of Onset  . Heart failure Mother   . Bladder Cancer Father   . Pancreatic cancer Sister   . Lung cancer Sister   . Breast cancer Daughter   . Liver disease Son    History  Substance Use Topics  . Smoking status: Former Research scientist (life sciences)  . Smokeless tobacco: Never Used  . Alcohol Use: No    Review of Systems  Unable to perform ROS: Mental status change      Allergies  Albuterol sulfate hfa; Adhesive; Albuterol; Contrast media; Lactose; Metrizamide; Penicillins; Sulfa drugs cross reactors; Budesonide-formoterol fumarate; and Sulfamethoxazole  Home Medications   Prior to Admission medications   Medication Sig Start Date End Date Taking? Authorizing Provider  amLODipine (NORVASC) 10 MG tablet Take 5 mg by mouth daily.     Historical Provider, MD  aspirin 81 MG tablet Take 81 mg by mouth daily.    Historical Provider, MD  bicalutamide (CASODEX) 50 MG tablet Take 50 mg by mouth daily.    Historical Provider, MD  Calcium Carbonate-Vitamin D (CALCIUM 600+D HIGH POTENCY) 600-400 MG-UNIT per tablet Take  1 tablet by mouth 2 (two) times daily.      Historical Provider, MD  CRANBERRY CONCENTRATE PO Take 4 capsules by mouth daily.    Historical Provider, MD  fentaNYL (DURAGESIC - DOSED MCG/HR) 25 MCG/HR patch Place 25 mcg onto the skin every 3 (three) days.    Historical Provider, MD  fluticasone (FLONASE) 50 MCG/ACT nasal spray Place 2 sprays into the nose 2 (two) times daily.    Historical Provider, MD  HYDROcodone-acetaminophen (NORCO/VICODIN) 5-325 MG per tablet Take 1 tablet by mouth every 6 (six) hours as needed for moderate pain. 01/05/14   Domenic Polite, MD  hydrOXYzine (ATARAX/VISTARIL) 25 MG tablet Take 25 mg by mouth 3 (three) times daily as needed.    Historical Provider, MD  insulin aspart (NOVOLOG) 100 UNIT/ML injection Inject 0-6 Units into the skin 3 (three) times daily with meals. 0-150=0; 151-200=2u; 201-250=3u; 251-300=4u;  301-350=5u; over 350 =6u prior to meals. With breakfast and lunch. 01/18/13   Gerlene Fee, NP  insulin glargine (LANTUS) 100 UNIT/ML injection Inject 28 Units into the skin at bedtime.     Historical Provider, MD  lipase/protease/amylase (CREON-12/PANCREASE) 12000 UNITS CPEP Take 2 capsules by mouth 3 (three) times daily.     Historical Provider, MD  Melatonin 5 MG TABS Take 5 mg by mouth at bedtime.    Historical Provider, MD  metoprolol succinate (TOPROL-XL) 25 MG 24 hr tablet Take 12.5 mg by mouth daily.    Historical Provider, MD  Multiple Vitamins-Minerals (MULTIVITAMINS THER. W/MINERALS) TABS Take 1 tablet by mouth every morning.     Historical Provider, MD  nitroGLYCERIN (NITROSTAT) 0.4 MG SL tablet Place 0.4 mg under the tongue.    Historical Provider, MD  omeprazole (PRILOSEC) 20 MG capsule Take 20 mg by mouth 2 (two) times daily before a meal.    Historical Provider, MD  oxybutynin (DITROPAN) 5 MG tablet Take 5 mg by mouth daily.    Historical Provider, MD  sertraline (ZOLOFT) 100 MG tablet Take 100 mg by mouth 2 (two) times daily.     Historical Provider, MD  simvastatin (ZOCOR) 20 MG tablet Take 10 mg by mouth every other day.    Historical Provider, MD   BP 154/72  Pulse 96  Resp 20  SpO2 96% Physical Exam  Nursing note reviewed. Constitutional: He appears distressed.  Cachectic elderly male  Eyes: Conjunctivae are normal. Right eye exhibits no discharge. Left eye exhibits no discharge.  Neck: No tracheal deviation present.  Cardiovascular: Regular rhythm.  Tachycardia present.   Pulmonary/Chest: No stridor. Tachypnea noted.  Abdominal: He exhibits no distension. There is no tenderness.  Genitourinary:  Profound amounts of brown stool. Superficial irritation of both glutteal cheeks  Musculoskeletal: He exhibits no edema.  Neurological:  Oriented to place, roughly, and to self. MAES, does not follow commands. Poor hearing?   Skin: He is diaphoretic.  Psychiatric:  Cognition and memory are impaired.    ED Course  Procedures (including critical care time) Labs Review Labs Reviewed  CBC WITH DIFFERENTIAL - Abnormal; Notable for the following:    Platelets 123 (*)    Neutrophils Relative % 96 (*)    Lymphocytes Relative 2 (*)    Lymphs Abs 0.1 (*)    Monocytes Relative 1 (*)    Monocytes Absolute 0.0 (*)    All other components within normal limits  COMPREHENSIVE METABOLIC PANEL - Abnormal; Notable for the following:    CO2 18 (*)    Glucose, Bld 198 (*)  BUN 36 (*)    Albumin 2.9 (*)    AST 154 (*)    Alkaline Phosphatase 280 (*)    GFR calc non Af Amer 51 (*)    GFR calc Af Amer 59 (*)    Anion gap 18 (*)    All other components within normal limits  URINALYSIS, ROUTINE W REFLEX MICROSCOPIC - Abnormal; Notable for the following:    APPearance CLOUDY (*)    Hgb urine dipstick LARGE (*)    Protein, ur 30 (*)    Leukocytes, UA MODERATE (*)    All other components within normal limits  PROTIME-INR - Abnormal; Notable for the following:    Prothrombin Time 17.2 (*)    All other components within normal limits  URINE MICROSCOPIC-ADD ON - Abnormal; Notable for the following:    Bacteria, UA MANY (*)    All other components within normal limits  I-STAT CG4 LACTIC ACID, ED - Abnormal; Notable for the following:    Lactic Acid, Venous 4.48 (*)    All other components within normal limits  POC OCCULT BLOOD, ED    Imaging Review Ct Head Wo Contrast  04/02/2014   CLINICAL DATA:  Altered mental status, tremor  EXAM: CT HEAD WITHOUT CONTRAST  TECHNIQUE: Contiguous axial images were obtained from the base of the skull through the vertex without intravenous contrast.  COMPARISON:  02/08/2014  FINDINGS: No evidence of parenchymal hemorrhage or extra-axial fluid collection. No mass lesion, mass effect, or midline shift.  No CT evidence of acute infarction.  Subcortical white matter and periventricular small vessel ischemic changes. Old left basal  ganglia lacunar infarct.  Global cortical atrophy.  No ventriculomegaly.  Near complete opacification of the right maxillary sinus. Partial opacification of the left ethmoid sinus. Mastoid air cells are clear.  No evidence of calvarial fracture.  IMPRESSION: No evidence of acute intracranial abnormality.  Old left basal ganglia lacunar infarct.  Atrophy with small vessel ischemic changes and intracranial atherosclerosis.   Electronically Signed   By: Julian Hy M.D.   On: 04/02/2014 19:54   Dg Chest Port 1 View  04/02/2014   CLINICAL DATA:  Fever and altered mental status  EXAM: PORTABLE CHEST - 1 VIEW  COMPARISON:  12/29/13  FINDINGS: Cardiac shadow is stable. The lungs are well aerated bilaterally. Diffuse interstitial changes are again identified and stable. No acute bony abnormality is seen. Old rib fractures are again noted. Old proximal right humeral fracture is seen as well.  IMPRESSION: Chronic changes without acute abnormality.   Electronically Signed   By: Inez Catalina M.D.   On: 04/02/2014 19:38    I reviewed the patient's chart.   9:14 PM Patient now awake, alert, heart rate is now 85, patient is no longer tachypneic there I discussed all findings with him and his wife. She confirms that the patient is DO NOT RESUSCITATE.  MDM  This patient presents with altered mental status, and on initial exam is tachycardic, tachypneic, febrile, altered. Given patient's history of prostate cancer with known metastases, progression of disease versus infection are additional considerations. Patient's evaluation demonstrates likely urinary tract infection. Given the patient's altered mental status, his infection from her started on antibiotics, received fluid resuscitation with substantial improvement in his condition, but required admission for further evaluation and management.   Carmin Muskrat, MD 04/02/14 2115

## 2014-04-03 ENCOUNTER — Encounter (HOSPITAL_COMMUNITY): Payer: Self-pay | Admitting: Cardiology

## 2014-04-03 DIAGNOSIS — I359 Nonrheumatic aortic valve disorder, unspecified: Secondary | ICD-10-CM

## 2014-04-03 DIAGNOSIS — R7989 Other specified abnormal findings of blood chemistry: Secondary | ICD-10-CM

## 2014-04-03 DIAGNOSIS — R778 Other specified abnormalities of plasma proteins: Secondary | ICD-10-CM | POA: Diagnosis present

## 2014-04-03 DIAGNOSIS — D65 Disseminated intravascular coagulation [defibrination syndrome]: Secondary | ICD-10-CM

## 2014-04-03 DIAGNOSIS — R9431 Abnormal electrocardiogram [ECG] [EKG]: Secondary | ICD-10-CM

## 2014-04-03 DIAGNOSIS — I2510 Atherosclerotic heart disease of native coronary artery without angina pectoris: Secondary | ICD-10-CM

## 2014-04-03 DIAGNOSIS — I2583 Coronary atherosclerosis due to lipid rich plaque: Secondary | ICD-10-CM

## 2014-04-03 LAB — CBC
HCT: 34.1 % — ABNORMAL LOW (ref 39.0–52.0)
Hemoglobin: 11.5 g/dL — ABNORMAL LOW (ref 13.0–17.0)
MCH: 32.7 pg (ref 26.0–34.0)
MCHC: 33.7 g/dL (ref 30.0–36.0)
MCV: 96.9 fL (ref 78.0–100.0)
PLATELETS: 61 10*3/uL — AB (ref 150–400)
RBC: 3.52 MIL/uL — ABNORMAL LOW (ref 4.22–5.81)
RDW: 15.3 % (ref 11.5–15.5)
WBC: 15.6 10*3/uL — AB (ref 4.0–10.5)

## 2014-04-03 LAB — PHOSPHORUS: Phosphorus: 2.5 mg/dL (ref 2.3–4.6)

## 2014-04-03 LAB — COMPREHENSIVE METABOLIC PANEL
ALT: 47 U/L (ref 0–53)
ANION GAP: 16 — AB (ref 5–15)
AST: 94 U/L — AB (ref 0–37)
Albumin: 2.3 g/dL — ABNORMAL LOW (ref 3.5–5.2)
Alkaline Phosphatase: 161 U/L — ABNORMAL HIGH (ref 39–117)
BUN: 40 mg/dL — AB (ref 6–23)
CHLORIDE: 107 meq/L (ref 96–112)
CO2: 21 meq/L (ref 19–32)
CREATININE: 1.65 mg/dL — AB (ref 0.50–1.35)
Calcium: 9.1 mg/dL (ref 8.4–10.5)
GFR calc Af Amer: 40 mL/min — ABNORMAL LOW (ref >90)
GFR, EST NON AFRICAN AMERICAN: 35 mL/min — AB (ref >90)
Glucose, Bld: 184 mg/dL — ABNORMAL HIGH (ref 70–99)
Potassium: 3.3 mEq/L — ABNORMAL LOW (ref 3.7–5.3)
Sodium: 144 mEq/L (ref 137–147)
Total Bilirubin: 0.5 mg/dL (ref 0.3–1.2)
Total Protein: 6.5 g/dL (ref 6.0–8.3)

## 2014-04-03 LAB — HEMOGLOBIN A1C
HEMOGLOBIN A1C: 7.8 % — AB (ref <5.7)
MEAN PLASMA GLUCOSE: 177 mg/dL — AB (ref <117)

## 2014-04-03 LAB — DIC (DISSEMINATED INTRAVASCULAR COAGULATION)PANEL
D-Dimer, Quant: 18.25 ug/mL-FEU — ABNORMAL HIGH (ref 0.00–0.48)
Fibrinogen: 338 mg/dL (ref 204–475)
INR: 1.49 (ref 0.00–1.49)
Platelets: 63 10*3/uL — ABNORMAL LOW (ref 150–400)
Prothrombin Time: 18 seconds — ABNORMAL HIGH (ref 11.6–15.2)
Smear Review: NONE SEEN
aPTT: 47 seconds — ABNORMAL HIGH (ref 24–37)

## 2014-04-03 LAB — TROPONIN I
TROPONIN I: 0.5 ng/mL — AB (ref <0.30)
Troponin I: 0.39 ng/mL (ref <0.30)
Troponin I: 0.48 ng/mL (ref <0.30)

## 2014-04-03 LAB — LACTIC ACID, PLASMA: Lactic Acid, Venous: 2.3 mmol/L — ABNORMAL HIGH (ref 0.5–2.2)

## 2014-04-03 LAB — TSH: TSH: 1.84 u[IU]/mL (ref 0.350–4.500)

## 2014-04-03 LAB — PROTIME-INR
INR: 1.47 (ref 0.00–1.49)
Prothrombin Time: 17.8 seconds — ABNORMAL HIGH (ref 11.6–15.2)

## 2014-04-03 LAB — GLUCOSE, CAPILLARY
GLUCOSE-CAPILLARY: 92 mg/dL (ref 70–99)
GLUCOSE-CAPILLARY: 109 mg/dL — AB (ref 70–99)
Glucose-Capillary: 256 mg/dL — ABNORMAL HIGH (ref 70–99)
Glucose-Capillary: 112 mg/dL — ABNORMAL HIGH (ref 70–99)

## 2014-04-03 LAB — MAGNESIUM: Magnesium: 1.6 mg/dL (ref 1.5–2.5)

## 2014-04-03 LAB — MRSA PCR SCREENING: MRSA by PCR: POSITIVE — AB

## 2014-04-03 LAB — CLOSTRIDIUM DIFFICILE BY PCR: Toxigenic C. Difficile by PCR: NEGATIVE

## 2014-04-03 MED ORDER — ONDANSETRON HCL 4 MG/2ML IJ SOLN: 4.0000 mg | Freq: Four times a day (QID) | INTRAMUSCULAR | Status: DC | PRN

## 2014-04-03 MED ORDER — MAGNESIUM SULFATE 40 MG/ML IJ SOLN
2.0000 g | Freq: Once | INTRAMUSCULAR | Status: AC
  Administered 2014-04-03: 2 g via INTRAVENOUS
  Filled 2014-04-03: qty 50

## 2014-04-03 MED ORDER — SIMVASTATIN 10 MG PO TABS
10.0000 mg | ORAL_TABLET | ORAL | Status: DC
  Administered 2014-04-03 – 2014-04-07 (×3): 10 mg via ORAL
  Filled 2014-04-03 (×3): qty 1

## 2014-04-03 MED ORDER — PANCRELIPASE (LIP-PROT-AMYL) 12000-38000 UNITS PO CPEP
2.0000 | ORAL_CAPSULE | Freq: Three times a day (TID) | ORAL | Status: DC
  Administered 2014-04-03 – 2014-04-07 (×13): 2 via ORAL
  Filled 2014-04-03 (×16): qty 2

## 2014-04-03 MED ORDER — ALBUTEROL SULFATE (2.5 MG/3ML) 0.083% IN NEBU: 2.5000 mg | INHALATION_SOLUTION | RESPIRATORY_TRACT | Status: DC | PRN

## 2014-04-03 MED ORDER — IPRATROPIUM BROMIDE 0.02 % IN SOLN: 0.5000 mg | Freq: Four times a day (QID) | RESPIRATORY_TRACT | Status: DC

## 2014-04-03 MED ORDER — HYDROCODONE-ACETAMINOPHEN 5-325 MG PO TABS: 1.0000 | ORAL_TABLET | ORAL | Status: DC | PRN

## 2014-04-03 MED ORDER — POTASSIUM CHLORIDE CRYS ER 20 MEQ PO TBCR
40.0000 meq | EXTENDED_RELEASE_TABLET | Freq: Once | ORAL | Status: AC
  Administered 2014-04-03: 40 meq via ORAL
  Filled 2014-04-03: qty 2

## 2014-04-03 MED ORDER — PANTOPRAZOLE SODIUM 40 MG PO TBEC
40.0000 mg | DELAYED_RELEASE_TABLET | Freq: Every day | ORAL | Status: DC
  Administered 2014-04-03 – 2014-04-07 (×5): 40 mg via ORAL
  Filled 2014-04-03 (×5): qty 1

## 2014-04-03 MED ORDER — GUAIFENESIN ER 600 MG PO TB12
600.0000 mg | ORAL_TABLET | Freq: Two times a day (BID) | ORAL | Status: DC
  Administered 2014-04-03 – 2014-04-06 (×9): 600 mg via ORAL
  Filled 2014-04-03 (×11): qty 1

## 2014-04-03 MED ORDER — INSULIN ASPART 100 UNIT/ML ~~LOC~~ SOLN
0.0000 [IU] | SUBCUTANEOUS | Status: DC
  Administered 2014-04-03: 5 [IU] via SUBCUTANEOUS
  Administered 2014-04-03: 2 [IU] via SUBCUTANEOUS
  Administered 2014-04-04 (×2): 1 [IU] via SUBCUTANEOUS
  Administered 2014-04-04 – 2014-04-06 (×4): 2 [IU] via SUBCUTANEOUS
  Administered 2014-04-06: 1 [IU] via SUBCUTANEOUS
  Administered 2014-04-06 (×2): 2 [IU] via SUBCUTANEOUS
  Administered 2014-04-07: 1 [IU] via SUBCUTANEOUS

## 2014-04-03 MED ORDER — DOCUSATE SODIUM 100 MG PO CAPS
100.0000 mg | ORAL_CAPSULE | Freq: Two times a day (BID) | ORAL | Status: DC
  Administered 2014-04-03 – 2014-04-04 (×4): 100 mg via ORAL
  Filled 2014-04-03 (×7): qty 1

## 2014-04-03 MED ORDER — ASPIRIN 81 MG PO CHEW
81.0000 mg | CHEWABLE_TABLET | Freq: Every day | ORAL | Status: DC
  Administered 2014-04-03: 81 mg via ORAL
  Filled 2014-04-03: qty 1

## 2014-04-03 MED ORDER — VANCOMYCIN HCL 500 MG IV SOLR
500.0000 mg | INTRAVENOUS | Status: DC
  Administered 2014-04-03 – 2014-04-04 (×2): 500 mg via INTRAVENOUS
  Filled 2014-04-03 (×2): qty 500

## 2014-04-03 MED ORDER — SODIUM CHLORIDE 0.9 % IV SOLN
INTRAVENOUS | Status: DC
  Administered 2014-04-03 (×2): via INTRAVENOUS
  Administered 2014-04-04: 75 mL/h via INTRAVENOUS
  Administered 2014-04-05 (×2): via INTRAVENOUS
  Administered 2014-04-06: 75 mL/h via INTRAVENOUS

## 2014-04-03 MED ORDER — MELATONIN 5 MG PO TABS: 5.0000 mg | ORAL_TABLET | Freq: Every day | ORAL | Status: DC

## 2014-04-03 MED ORDER — METOPROLOL SUCCINATE 12.5 MG HALF TABLET
12.5000 mg | ORAL_TABLET | Freq: Every day | ORAL | Status: DC
  Administered 2014-04-03 – 2014-04-07 (×4): 12.5 mg via ORAL
  Filled 2014-04-03 (×5): qty 1

## 2014-04-03 MED ORDER — ONDANSETRON HCL 4 MG PO TABS: 4.0000 mg | ORAL_TABLET | Freq: Four times a day (QID) | ORAL | Status: DC | PRN

## 2014-04-03 MED ORDER — INSULIN GLARGINE 100 UNIT/ML ~~LOC~~ SOLN
28.0000 [IU] | Freq: Every day | SUBCUTANEOUS | Status: DC
  Administered 2014-04-03 (×2): 28 [IU] via SUBCUTANEOUS
  Filled 2014-04-03 (×3): qty 0.28

## 2014-04-03 MED ORDER — SODIUM CHLORIDE 0.9 % IJ SOLN
3.0000 mL | Freq: Two times a day (BID) | INTRAMUSCULAR | Status: DC
  Administered 2014-04-03 – 2014-04-07 (×3): 3 mL via INTRAVENOUS

## 2014-04-03 MED ORDER — CEFEPIME HCL 1 G IJ SOLR
1.0000 g | INTRAMUSCULAR | Status: DC
  Administered 2014-04-04: 1 g via INTRAVENOUS
  Filled 2014-04-03 (×2): qty 1

## 2014-04-03 MED ORDER — OXYBUTYNIN CHLORIDE 5 MG PO TABS
5.0000 mg | ORAL_TABLET | Freq: Every day | ORAL | Status: DC
  Administered 2014-04-03 – 2014-04-07 (×5): 5 mg via ORAL
  Filled 2014-04-03 (×5): qty 1

## 2014-04-03 MED ORDER — SERTRALINE HCL 100 MG PO TABS
100.0000 mg | ORAL_TABLET | Freq: Two times a day (BID) | ORAL | Status: DC
  Administered 2014-04-03: 100 mg via ORAL
  Filled 2014-04-03 (×3): qty 1

## 2014-04-03 MED ORDER — ACETAMINOPHEN 325 MG PO TABS: 650.0000 mg | ORAL_TABLET | Freq: Four times a day (QID) | ORAL | Status: DC | PRN

## 2014-04-03 MED ORDER — ACETAMINOPHEN 650 MG RE SUPP: 650.0000 mg | Freq: Four times a day (QID) | RECTAL | Status: DC | PRN

## 2014-04-03 MED ORDER — HYDROCODONE-ACETAMINOPHEN 5-325 MG PO TABS
1.0000 | ORAL_TABLET | Freq: Every day | ORAL | Status: DC
  Administered 2014-04-03 – 2014-04-06 (×5): 1 via ORAL
  Filled 2014-04-03 (×5): qty 1

## 2014-04-03 NOTE — Evaluation (Signed)
Clinical/Bedside Swallow Evaluation Patient Details  Name: Guy [name] MRN: [id number] Date of Birth: [birth year]  Today's Date: 04/03/2014 Time: 5400-8676 SLP Time Calculation (min): 16 min  Past Medical History:  Past Medical History  Diagnosis Date  . Hypertension   . Diabetes mellitus without complication   . Arthritis   . Sciatica   . GERD (gastroesophageal reflux disease)   . Urge incontinence   . Vertigo   . Hyperlipidemia   . ED (erectile dysfunction)   . Gastric polyposis   . Osteoporosis   . Decreased libido   . Depression   . Hepatitis A   . Stroke   . History of blood transfusion     1946  . OA (osteoarthritis)   . Bladder cancer   . Prostate cancer     S/P prostatectomy; Lung metastasis  . CAD (coronary artery disease) 1996    stent to the LAD   . Hyperlipidemia   . Type 2 diabetes mellitus    Past Surgical History:  Past Surgical History  Procedure Laterality Date  . Carpal tunnel release    . Cataract extraction w/ intraocular lens  implant, bilateral  1998  . Prostatectomy    . Urinary sphincter implant    . Urinary sphincter implant revision    . Appendectomy    . Left hip surgery      Donated bone for bone graft to arm  . Penile prosthesis implant      S/P removal and re-implantation of new prosthesis  . Middle ear surgery    . Finger surgery      Left and right  . Hernia repair    . Bladder surgery    . Right arm bone graft      Pathological fracture  . Cardiac catheterization  11/27/2001    patent stent with mild in-stent restenosis.  . Myoview  06/12/2009    Persantine myoview EF 76%; Normal myoview  . Coronary angioplasty  1996    stenting to the LAD in [place].    HPI:  80 year old gentleman with history of metastatic prostate cancer diabetes and coronary disease presents with altered mental status in the setting of UTI and sepsis   Assessment / Plan / Recommendation Clinical Impression  Pt demonstrates function consistent  with recent MBS findings of moderate oropharyngeal and esophageal dysphagia. Today pt observed to intermittent wet vocal quality when given controlled swmall sips, cleared with cueing for a throat clear after being given solids, and then a sip of liquid, pt had slight regurgitation of water after cued throat clear. Ability to masticate quite impaired with missing dentition and dry oral mucosa. Recommend pt downgrade to dys 2/thin liquids with supervisiton for small sips, no straws and intermittent throat clear with basic esophageal precautions. Discussed with RN who return demosntrated strategies. SLP will f/u for tolerance.     Aspiration Risk  Moderate    Diet Recommendation Dysphagia 2 (Fine chop);Thin liquid   Liquid Administration via: Cup;No straw Medication Administration: Whole meds with puree Supervision: Staff to assist with self feeding Compensations: Slow rate;Small sips/bites;Clear throat intermittently Postural Changes and/or Swallow Maneuvers: Seated upright 90 degrees;Upright 30-60 min after meal    Other  Recommendations Oral Care Recommendations: Oral care BID   Follow Up Recommendations  None    Frequency and Duration min 2x/week  2 weeks   Pertinent Vitals/Pain NA    SLP Swallow Goals     Swallow Study Prior Functional Status  General HPI: 80 year old gentleman with history of metastatic prostate cancer diabetes and coronary disease presents with altered mental status in the setting of UTI and sepsis Type of Study: Bedside swallow evaluation Previous Swallow Assessment: MBS 52215 - Dys 3/thin with intermittent throat clear, small sips Diet Prior to this Study: Regular;Thin liquids Temperature Spikes Noted: No Respiratory Status: Room air History of Recent Intubation: No Behavior/Cognition: Alert;Cooperative;Pleasant mood Oral Cavity - Dentition: Edentulous (two teeth) Self-Feeding Abilities: Able to feed self;Needs assist Patient Positioning: Upright  in bed Baseline Vocal Quality: Clear Volitional Cough: Strong Volitional Swallow: Able to elicit    Oral/Motor/Sensory Function Overall Oral Motor/Sensory Function: Appears within functional limits for tasks assessed   Ice Chips     Thin Liquid Thin Liquid: Impaired Presentation: Cup Pharyngeal  Phase Impairments: Wet Vocal Quality    Nectar Thick Nectar Thick Liquid: Not tested   Honey Thick Honey Thick Liquid: Not tested   Puree Puree: Within functional limits   Solid   GO    Solid: Impaired Presentation: Self Fed Oral Phase Impairments: Impaired mastication;Impaired anterior to posterior transit Oral Phase Functional Implications: Oral residue      [facility], [place] CCC-SLP [phone]  Corday Wyka, Katherene Ponto 04/03/2014,11:51 AM

## 2014-04-03 NOTE — Progress Notes (Signed)
PT Cancellation Note  Patient Details Name: Guy [name] MRN: [id number] DOB: [birth year]   Cancelled Treatment:    Reason Eval/Treat Not Completed: Medical issues which prohibited therapy (elevated troponin 0.39-->up to 0.48 today; will see Monday 04/04/14)   Kenyon Ana 04/03/2014, 8:42 AM

## 2014-04-03 NOTE — Consult Note (Signed)
Admit date: 04/02/2014 Referring Physician  Dr. Clementeen Graham Primary Physician [name], MD Primary Cardiologist  Dr. Gwenlyn Found Reason for Consultation  +troponin in the setting of urosepsis  HPI: 80 year old male patient of Dr. Gwenlyn Found with CAD status post remote stenting in 1996 with recent nuclear stress test in June of 2015 demonstrating no ischemia, low risk here in the hospital currently with urosepsis, DIC with associated thrombocytopenia. Upon careful review of history, he may have had mild chest discomfort in the emergency department upon arrival. EKG at that time did demonstrate some ST segment depression and T-wave inversion in the anterior precordial lead. Subsequently, these have resolved. Echocardiogram was performed on 04/03/14 and demonstrated normal ejection fraction. No wall motion abnormality.  Aspirin is currently being held as well as heparin in the setting of mildly elevated troponin given his coagulopathy. Encephalopathy has resolved.  No current complaints. Hard of hearing.    PMH:   Past Medical History  Diagnosis Date  . Hypertension   . Diabetes mellitus without complication   . Arthritis   . Sciatica   . GERD (gastroesophageal reflux disease)   . Urge incontinence   . Vertigo   . Hyperlipidemia   . ED (erectile dysfunction)   . Gastric polyposis   . Osteoporosis   . Decreased libido   . Depression   . Hepatitis A   . Stroke   . History of blood transfusion     1946  . OA (osteoarthritis)   . Bladder cancer   . Prostate cancer     S/P prostatectomy; Lung metastasis  . CAD (coronary artery disease) 1996    stent to the LAD   . Hyperlipidemia   . Type 2 diabetes mellitus     PSH:   Past Surgical History  Procedure Laterality Date  . Carpal tunnel release    . Cataract extraction w/ intraocular lens  implant, bilateral  1998  . Prostatectomy    . Urinary sphincter implant    . Urinary sphincter implant revision    . Appendectomy    . Left  hip surgery      Donated bone for bone graft to arm  . Penile prosthesis implant      S/P removal and re-implantation of new prosthesis  . Middle ear surgery    . Finger surgery      Left and right  . Hernia repair    . Bladder surgery    . Right arm bone graft      Pathological fracture  . Cardiac catheterization  11/27/2001    patent stent with mild in-stent restenosis.  . Myoview  06/12/2009    Persantine myoview EF 76%; Normal myoview  . Coronary angioplasty  1996    stenting to the LAD in [place].    Allergies:  Albuterol sulfate hfa; Adhesive; Albuterol; Contrast media; Lactose; Metrizamide; Penicillins; Sulfa drugs cross reactors; Budesonide-formoterol fumarate; and Sulfamethoxazole Prior to Admit Meds:   Prior to Admission medications   Medication Sig Start Date End Date Taking? Authorizing Provider  amLODipine (NORVASC) 10 MG tablet Take 5 mg by mouth daily.    Yes Historical Provider, MD  aspirin 81 MG tablet Take 81 mg by mouth daily.   Yes Historical Provider, MD  bicalutamide (CASODEX) 50 MG tablet Take 50 mg by mouth daily.   Yes Historical Provider, MD  Calcium Carbonate-Vitamin D (CALCIUM 600+D HIGH POTENCY) 600-400 MG-UNIT per tablet Take 1 tablet by mouth 2 (two) times daily.  Yes Historical Provider, MD  camphor-menthol Timoteo Ace) lotion Apply 1 application topically daily as needed for itching.   Yes Historical Provider, MD  CRANBERRY CONCENTRATE PO Take 4 capsules by mouth daily.   Yes Historical Provider, MD  fluticasone (FLONASE) 50 MCG/ACT nasal spray Place 2 sprays into the nose 2 (two) times daily.   Yes Historical Provider, MD  HYDROcodone-acetaminophen (NORCO/VICODIN) 5-325 MG per tablet Take 1 tablet by mouth at bedtime.  01/05/14  Yes Domenic Polite, MD  hydrOXYzine (ATARAX/VISTARIL) 25 MG tablet Take 25 mg by mouth 3 (three) times daily as needed for anxiety or itching.    Yes Historical Provider, MD  insulin aspart (NOVOLOG) 100 UNIT/ML injection Inject  0-6 Units into the skin 3 (three) times daily with meals. 0-150=0; 151-200=2u; 201-250=3u; 251-300=4u; 301-350=5u; over 350 =6u prior to meals. With breakfast and lunch. 01/18/13  Yes Gerlene Fee, NP  insulin glargine (LANTUS) 100 UNIT/ML injection Inject 28 Units into the skin at bedtime.    Yes Historical Provider, MD  ivermectin (STROMECTOL) 3 MG TABS tablet Take 12 mg by mouth once a week.   Yes Historical Provider, MD  lipase/protease/amylase (CREON-12/PANCREASE) 12000 UNITS CPEP Take 2 capsules by mouth 3 (three) times daily.    Yes Historical Provider, MD  Melatonin 5 MG TABS Take 5 mg by mouth at bedtime.   Yes Historical Provider, MD  metoprolol succinate (TOPROL-XL) 25 MG 24 hr tablet Take 12.5 mg by mouth daily.   Yes Historical Provider, MD  miconazole (MICOTIN) 2 % powder Apply 1 application topically daily as needed for itching.   Yes Historical Provider, MD  Multiple Vitamins-Minerals (MULTIVITAMINS THER. W/MINERALS) TABS Take 1 tablet by mouth every morning.    Yes Historical Provider, MD  omeprazole (PRILOSEC) 20 MG capsule Take 20 mg by mouth 2 (two) times daily before a meal.   Yes Historical Provider, MD  oxybutynin (DITROPAN) 5 MG tablet Take 5 mg by mouth daily.   Yes Historical Provider, MD  sertraline (ZOLOFT) 100 MG tablet Take 100 mg by mouth 2 (two) times daily.    Yes Historical Provider, MD  silver sulfADIAZINE (SILVADENE) 1 % cream Apply 1 application topically daily.   Yes Historical Provider, MD  simvastatin (ZOCOR) 20 MG tablet Take 10 mg by mouth every other day.   Yes Historical Provider, MD  nitroGLYCERIN (NITROSTAT) 0.4 MG SL tablet Place 0.4 mg under the tongue.    Historical Provider, MD   Fam HX:    Family History  Problem Relation Age of Onset  . Heart failure Mother   . Bladder Cancer Father   . Pancreatic cancer Sister   . Lung cancer Sister   . Breast cancer Daughter   . Liver disease Son    Social HX:    History   Social History  . Marital  Status: Married    Spouse Name: [name]    Number of Children: 3  . Years of Education: N/A   Occupational History  . Business owner, Press photographer, retired    Social History Main Topics  . Smoking status: Former Research scientist (life sciences)  . Smokeless tobacco: Never Used  . Alcohol Use: No  . Drug Use: No  . Sexual Activity: No   Other Topics Concern  . Not on file   Social History Narrative   Married.  Lives with his wife.  Ambulates with a walker and a cane.     ROS:  May have had chest discomfort upon arrival to emergency room. No  current chest pain, no shortness of breath, no orthopnea, rash, bleeding All 11 ROS were addressed and are negative except what is stated in the HPI   Physical Exam: Blood pressure 111/47, pulse 65, temperature 98.6 F (37 C), temperature source Oral, resp. rate 16, height 5\' 6"  (1.676 m), weight 117 lb 15.1 oz (53.5 kg), SpO2 100.00%.   General: Elderly, thin, in no acute distress Head: Eyes PERRLA, No xanthomas.   Deaf. Normal cephalic and atramatic  Lungs:   Clear bilaterally to auscultation with faint basilar crackles. Normal respiratory effort. No wheezes, no rales. Heart:   HRRR S1 S2 Pulses are 2+ & equal. Soft systolic murmur, no rubs, gallops.  No carotid bruit. No JVD.  No abdominal bruits.  Abdomen: Bowel sounds are positive, abdomen soft and non-tender without masses. No hepatosplenomegaly. Msk:  Back normal. Normal strength and tone for age. Extremities:  No clubbing, cyanosis or edema.  DP +1 Neuro: Alert and oriented X 3, non-focal, MAE x 4 GU: Deferred Rectal: Deferred Psych:  Good affect, responds appropriately      Labs: Lab Results  Component Value Date   WBC 15.6* 04/03/2014   HGB 11.5* 04/03/2014   HCT 34.1* 04/03/2014   MCV 96.9 04/03/2014   PLT 63* 04/03/2014     Recent Labs Lab 04/03/14 0615  NA 144  K 3.3*  CL 107  CO2 21  BUN 40*  CREATININE 1.65*  CALCIUM 9.1  PROT 6.5  BILITOT 0.5  ALKPHOS 161*  ALT 47  AST 94*  GLUCOSE 184*     Recent Labs  04/03/14 0005 04/03/14 0615 04/03/14 1208  TROPONINI 0.39* 0.48* 0.50*   Lab Results  Component Value Date   CHOL 80 12/30/2012   HDL 26* 12/30/2012   LDLCALC 37 12/30/2012   TRIG 85 12/30/2012   Lab Results  Component Value Date   DDIMER 18.25* 04/03/2014     Radiology:  Ct Head Wo Contrast  04/02/2014   CLINICAL DATA:  Altered mental status, tremor  EXAM: CT HEAD WITHOUT CONTRAST  TECHNIQUE: Contiguous axial images were obtained from the base of the skull through the vertex without intravenous contrast.  COMPARISON:  02/08/2014  FINDINGS: No evidence of parenchymal hemorrhage or extra-axial fluid collection. No mass lesion, mass effect, or midline shift.  No CT evidence of acute infarction.  Subcortical white matter and periventricular small vessel ischemic changes. Old left basal ganglia lacunar infarct.  Global cortical atrophy.  No ventriculomegaly.  Near complete opacification of the right maxillary sinus. Partial opacification of the left ethmoid sinus. Mastoid air cells are clear.  No evidence of calvarial fracture.  IMPRESSION: No evidence of acute intracranial abnormality.  Old left basal ganglia lacunar infarct.  Atrophy with small vessel ischemic changes and intracranial atherosclerosis.   Electronically Signed   By: Julian Hy M.D.   On: 04/02/2014 19:54   Dg Chest Port 1 View  04/02/2014   CLINICAL DATA:  Fever and altered mental status  EXAM: PORTABLE CHEST - 1 VIEW  COMPARISON:  12/29/13  FINDINGS: Cardiac shadow is stable. The lungs are well aerated bilaterally. Diffuse interstitial changes are again identified and stable. No acute bony abnormality is seen. Old rib fractures are again noted. Old proximal right humeral fracture is seen as well.  IMPRESSION: Chronic changes without acute abnormality.   Electronically Signed   By: Inez Catalina M.D.   On: 04/02/2014 19:38   Personally viewed.  EKG:   04/03/14-3 AM-sinus rhythm, right bundle branch  block, old inferior  infarct pattern, prolonged QT, ST segment/T-wave inversion anterior precordial leads. Second EKG at 1323 shows resolution of T-wave inversion in anterior precordial leads. Personally viewed.   Echocardiogram 04/03/14-ejection fraction 60%, moderate LVH, no obvious wall motion abnormalities, mild aortic valve regurgitation with calcification.  Nuclear stress test 03/02/14-low risk, no ischemia. Remote LAD stenting in 1996 in [place], catheterization 2003 revealed patent LAD stent with mild in-stent restenosis.  ASSESSMENT/PLAN:    80 year old male with coronary artery disease status post LAD stenting in 1996 with recent low risk nuclear stress test 6/15 here with urosepsis, abnormal EKG, prolonged QT, thrombocytopenia, DIC and mildly elevated troponin of 0.5.  1. Demand ischemia-elevated troponin is likely secondary to demand ischemia in the setting of urosepsis and does not represent true acute coronary syndrome or infarction. Given his multiple/multisystem organ sequelae, he would not be a candidate at this point for any further cardiac testing. His ejection fraction thankfully is normal at 60% with no obvious wall motion abnormalities. Continue with medical management. Obviously, with DIC, avoid anticoagulation, avoid aspirin but when able, please resume antiplatelet aspirin. ST segment depression noted on original EKG with subsequent EKG demonstrating resolution. Reassuring. His mildly elevated troponin does portend increase mortality.  2. CAD-remote LAD stenting, low risk nuclear stress test approximately one month ago. No ischemia.  3. Prolonged QT-in the setting of sepsis, electrolyte abnormality. Magnesium is being replenished as well as low potassium. Keep K. greater than 4, magnesium greater than 2.  Will follow. Seems to be improving clinically since admit.   Candee Furbish, MD  04/03/2014  3:30 PM

## 2014-04-03 NOTE — Plan of Care (Signed)
Problem: Phase I Progression Outcomes Goal: Voiding-avoid urinary catheter unless indicated Outcome: Not Applicable Date Met:  25/42/70 Pt with chronic foley catheter

## 2014-04-03 NOTE — Progress Notes (Signed)
CRITICAL VALUE ALERT  Critical value received:  Troponin 0.39  Date of notification:  04/03/14  Time of notification:  0108   Critical value read back:Yes.    Nurse who received alert:  Edilia Bo RN   MD notified (1st page):  Fredirick Maudlin, NP  Time of first page:  0118  MD notified (2nd page):  Time of second page:  Responding MD:  Fredirick Maudlin, NP  Time MD responded:  0127  No new orders received, will continue to monitor pt

## 2014-04-03 NOTE — Progress Notes (Signed)
PHARMACIST - PHYSICIAN ORDER COMMUNICATION  CONCERNING: P&T Medication Policy on Herbal Medications  DESCRIPTION:  This patient's order for:  MELATONIN  has been noted.  This product(s) is classified as an "herbal" or natural product. Due to a lack of definitive safety studies or FDA approval, nonstandard manufacturing practices, plus the potential risk of unknown drug-drug interactions while on inpatient medications, the Pharmacy and Therapeutics Committee does not permit the use of "herbal" or natural products of this type within [facility].   ACTION TAKEN: The pharmacy department is unable to verify this order at this time and your patient has been informed of this safety policy. Please reevaluate patient's clinical condition at discharge and address if the herbal or natural product(s) should be resumed at that time.  Thank you, Leone Haven, PharmD

## 2014-04-03 NOTE — Progress Notes (Signed)
  Echocardiogram 2D Echocardiogram has been performed.  Darlina Sicilian M 04/03/2014, 8:40 AM

## 2014-04-03 NOTE — Progress Notes (Signed)
CRITICAL VALUE ALERT    Critical value received:  Troponin .50  Date of notification:  04/03/14  Time of notification:  RN saw resulted lab   Critical value read back: N/A  Nurse who received alert:  J.Wellington Winegarden SAW lab result in computer  MD notified (1st page):  Dhungel  Time of first page:  54  MD notified (2nd page): N/A  Time of second page: N/A  Responding MD:   Time MD responded:

## 2014-04-03 NOTE — Progress Notes (Signed)
[facility] PROGRESS NOTE  Guy [name] FEO:[id number] DOB: 06/15/1920 DOA: 04/02/2014 PCP: [name], MD   Brief narrative 80 year old male with history of hypertension, diabetes mellitus, GERD, history of stroke, metastatic prostate cancer to the lungs and ribs (follows at [facility]), CAD with history of cardiac cath in 2004 and recent negative stress test (one month back) presented with sepsis likely due to UTI.   Assessment/Plan:  Severe sepsis Likely due to UTI. Patient has findings of DIC.  empiric vancomycin and cefepime was held continue. Blood pressure on presentation Responded to IV hydration. Follow blood culture and urine culture. Continue Tylenol when necessary for fever, supportive care with IV fluids and antiemetics.  Acute Encephalopathy Secondary to sepsis. Now resolved. Head CT unremarkable.  DIC Noted for thrombocytopenia,  elevated d-dimer and mildly elevated INR and lites in the setting of sepsis. Monitor with IV hydration. No signs of bleeding.  Elevated troponin Possibly demand ischemia secondary to sepsis. Noted slight ST depression in anteriolateral leads. Patient does report some chest discomfort in the ED. Monitor serial troponin and EKG. Patient had negative stress test about one month back and does not appear to be active ischemia. Cardiology consulted and will follow up with the recommendation. holding aspirin given thrombocytopenia. Cannot use anticoagulation for the same reason even if indicated. 2D. echo with normal EF and no wall motion abnormality  Acute kidney injury Possibly secondary hydration and sepsis. Monitor with IV hydration. avoid nephrotoxic agents  Hypokalemia Replenish with KCl  Diabetes mellitus Continue home dose Lantus and sliding scale insulin  Prostate CA with metastases. Follows at [facility] home pain medications  CAD Continue statin. Holding aspirin for low platelets  Prolonged QTC of 630 low  potassium and magnesium replenished. Follow Closely  generalized  weakness We'll obtain physical therapy.  Malnutrition Nutrition consult     Code Status: Full code Family Communication: Wife at Bedside Disposition Plan: Home once improved   Consultants:  cardiology  Procedures:  2-D echo  CT head  Antibiotics:  IV vancomycin and cefepime  HPI/Subjective: - seen an examined this morning. Denies any symptoms Objective: Filed Vitals:   04/03/14 0430  BP: 93/79  Pulse: 66  Temp: 97.9 F (36.6 C)  Resp: 16    Intake/Output Summary (Last 24 hours) at 04/03/14 1334 Last data filed at 04/03/14 1100  Gross per 24 hour  Intake   1763 ml  Output    325 ml  Net   1438 ml   Filed Weights   04/02/14 1904 04/02/14 2349  Weight: 53.071 kg (117 lb) 53.5 kg (117 lb 15.1 oz)    Exam:   General:   elderly thin built male in no acute distress  HEENT: No pallor, moist oral mucosa  Chest: Fine Bibasilar crackles,   CVS: Normal S1 and S2, no murmurs rub or gallop  Abdomen: Soft, nontender, nondistended, bowel sounds present  Extremities: Warm, no edema  CNS: AAO x3    Data Reviewed: Basic Metabolic Panel:  Recent Labs Lab 04/02/14 1943 04/03/14 0615  NA 139 144  K 5.3 3.3*  CL 103 107  CO2 18* 21  GLUCOSE 198* 184*  BUN 36* 40*  CREATININE 1.20 1.65*  CALCIUM 9.1 9.1  MG  --  1.6  PHOS  --  2.5   Liver Function Tests:  Recent Labs Lab 04/02/14 1943 04/03/14 0615  AST 154* 94*  ALT 53 47  ALKPHOS 280* 161*  BILITOT 0.9 0.5  PROT 7.5 6.5  ALBUMIN 2.9* 2.3*   No results found for this basename: LIPASE, AMYLASE,  in the last 168 hours No results found for this basename: AMMONIA,  in the last 168 hours CBC:  Recent Labs Lab 04/02/14 1943 04/03/14 0615 04/03/14 0844  WBC 6.6 15.6*  --   NEUTROABS 6.4  --   --   HGB 13.8 11.5*  --   HCT 41.2 34.1*  --   MCV 95.8 96.9  --   PLT 123* 61* 63*   Cardiac Enzymes:  Recent Labs Lab  04/03/14 0005 04/03/14 0615 04/03/14 1208  TROPONINI 0.39* 0.48* 0.50*   BNP (last 3 results)  Recent Labs  12/29/13 1120  PROBNP 2026.0*   CBG:  Recent Labs Lab 04/03/14 0100 04/03/14 1120  GLUCAP 256* 92    Recent Results (from the past 240 hour(s))  MRSA PCR SCREENING     Status: Abnormal   Collection Time    04/03/14  6:17 AM      Result Value Ref Range Status   MRSA by PCR POSITIVE (*) NEGATIVE Final   Comment:            The GeneXpert MRSA Assay (FDA     approved for NASAL specimens     only), is one component of a     comprehensive MRSA colonization     surveillance program. It is not     intended to diagnose MRSA     infection nor to guide or     monitor treatment for     MRSA infections.     RESULT CALLED TO, READ BACK BY AND VERIFIED WITH:     Beverlee Nims [id number] @ 9326 BY J SCOTTON     Studies: Ct Head Wo Contrast  04/02/2014   CLINICAL DATA:  Altered mental status, tremor  EXAM: CT HEAD WITHOUT CONTRAST  TECHNIQUE: Contiguous axial images were obtained from the base of the skull through the vertex without intravenous contrast.  COMPARISON:  02/08/2014  FINDINGS: No evidence of parenchymal hemorrhage or extra-axial fluid collection. No mass lesion, mass effect, or midline shift.  No CT evidence of acute infarction.  Subcortical white matter and periventricular small vessel ischemic changes. Old left basal ganglia lacunar infarct.  Global cortical atrophy.  No ventriculomegaly.  Near complete opacification of the right maxillary sinus. Partial opacification of the left ethmoid sinus. Mastoid air cells are clear.  No evidence of calvarial fracture.  IMPRESSION: No evidence of acute intracranial abnormality.  Old left basal ganglia lacunar infarct.  Atrophy with small vessel ischemic changes and intracranial atherosclerosis.   Electronically Signed   By: Julian Hy M.D.   On: 04/02/2014 19:54   Dg Chest Port 1 View  04/02/2014   CLINICAL DATA:  Fever  and altered mental status  EXAM: PORTABLE CHEST - 1 VIEW  COMPARISON:  12/29/13  FINDINGS: Cardiac shadow is stable. The lungs are well aerated bilaterally. Diffuse interstitial changes are again identified and stable. No acute bony abnormality is seen. Old rib fractures are again noted. Old proximal right humeral fracture is seen as well.  IMPRESSION: Chronic changes without acute abnormality.   Electronically Signed   By: Inez Catalina M.D.   On: 04/02/2014 19:38    Scheduled Meds: . aspirin  81 mg Oral Daily  . [START ON 04/04/2014] ceFEPime (MAXIPIME) IV  1 g Intravenous Q24H  . docusate sodium  100 mg Oral BID  . guaiFENesin  600 mg Oral BID  . HYDROcodone-acetaminophen  1 tablet Oral QHS  . insulin aspart  0-9 Units Subcutaneous 6 times per day  . insulin glargine  28 Units Subcutaneous QHS  . lipase/protease/amylase  2 capsule Oral TID WC  . metoprolol succinate  12.5 mg Oral Daily  . oxybutynin  5 mg Oral Daily  . pantoprazole  40 mg Oral Daily  . simvastatin  10 mg Oral QODAY  . sodium chloride  3 mL Intravenous Q12H  . vancomycin  500 mg Intravenous Q24H   Continuous Infusions: . sodium chloride 75 mL/hr at 04/03/14 0104      Time spent:  35 minutes    Guy [name]  [facility] Pager  [phone]. If 7PM-7AM, please contact night-coverage at [email or web address], password [facility] 04/03/2014, 1:34 PM  LOS: 1 day

## 2014-04-03 NOTE — Progress Notes (Signed)
ANTIBIOTIC CONSULT NOTE - FOLLOW UP  Pharmacy Consult for Vancomycin/Cefepime Indication: sepsis  Allergies  Allergen Reactions  . Albuterol Sulfate Hfa [Kdc:Albuterol] Shortness Of Breath and Swelling  . Adhesive [Tape] Other (See Comments)    REACTION: SKIN BLISTERS  . Albuterol Swelling    Per pt., side of his neck began to swell with nebulized Albuterol in doctor's office.  . Contrast Media [Iodinated Diagnostic Agents] Other (See Comments)    Reaction unknown  . Lactose     Other reaction(s): GI Upset (intolerance)  . Metrizamide Other (See Comments)    Reaction unknown  . Penicillins Other (See Comments) and Itching    REACTION: ITCHING HANDS  . Sulfa Drugs Cross Reactors Hives  . Budesonide-Formoterol Fumarate Rash  . Sulfamethoxazole Rash    Patient Measurements: Height: 5\' 6"  (167.6 cm) Weight: 117 lb 15.1 oz (53.5 kg) IBW/kg (Calculated) : 63.8   Vital Signs: Temp: 97.9 F (36.6 C) (07/05 0430) Temp src: Axillary (07/05 0430) BP: 93/79 mmHg (07/05 0430) Pulse Rate: 66 (07/05 0430) Intake/Output from previous day: 07/04 0701 - 07/05 0700 In: 1643 [I.V.:1643] Out: 325 [Urine:325] Intake/Output from this shift:    Labs:  Recent Labs  04/02/14 1943 04/03/14 0615 04/03/14 0844  WBC 6.6 15.6*  --   HGB 13.8 11.5*  --   PLT 123* 61* 63*  CREATININE 1.20 1.65*  --    Estimated Creatinine Clearance: 22.1 ml/min (by C-G formula based on Cr of 1.65). No results found for this basename: VANCOTROUGH, Corlis Leak, VANCORANDOM, GENTTROUGH, GENTPEAK, GENTRANDOM, TOBRATROUGH, TOBRAPEAK, TOBRARND, AMIKACINPEAK, AMIKACINTROU, AMIKACIN,  in the last 72 hours   Microbiology: Recent Results (from the past 720 hour(s))  MRSA PCR SCREENING     Status: Abnormal   Collection Time    04/03/14  6:17 AM      Result Value Ref Range Status   MRSA by PCR POSITIVE (*) NEGATIVE Final   Comment:            The GeneXpert MRSA Assay (FDA     approved for NASAL specimens   only), is one component of a     comprehensive MRSA colonization     surveillance program. It is not     intended to diagnose MRSA     infection nor to guide or     monitor treatment for     MRSA infections.     RESULT CALLED TO, READ BACK BY AND VERIFIED WITH:     Beverlee Nims [id number] @ 6294 BY J SCOTTON     Assessment: 69 yoM admitted with AMS and s/sxs of sepsis.  Pharmacy consulted to dose vancomycin and cefepime.  7/4 >>cefepime >> 7/4 >>vancomycin >>   Tmax: 103.5 > AF WBCs: elevated, increased Renal: SCr increased to 1.65, CrCl~21 ml/min (CG), 29 ml/min (normalized)  7/5 Cdiff sent 7/5 MRSA screen (+)   Goal of Therapy:  Vancomycin trough level 15-20 mcg/ml  Plan:  1.  Adjust Vancomycin from 750 mg IV q24h to 500 mg IV q24h for worsening SCr. 2.  Adjust Cefepime from 1g IV q12h to 1g IV q24h for worsening SCr. 3.  F/u SCr in AM.  Hershal Coria 04/03/2014,11:25 AM

## 2014-04-04 DIAGNOSIS — N179 Acute kidney failure, unspecified: Secondary | ICD-10-CM

## 2014-04-04 DIAGNOSIS — E162 Hypoglycemia, unspecified: Secondary | ICD-10-CM

## 2014-04-04 LAB — GLUCOSE, CAPILLARY
GLUCOSE-CAPILLARY: 136 mg/dL — AB (ref 70–99)
GLUCOSE-CAPILLARY: 148 mg/dL — AB (ref 70–99)
GLUCOSE-CAPILLARY: 154 mg/dL — AB (ref 70–99)
GLUCOSE-CAPILLARY: 198 mg/dL — AB (ref 70–99)
GLUCOSE-CAPILLARY: 60 mg/dL — AB (ref 70–99)
GLUCOSE-CAPILLARY: 64 mg/dL — AB (ref 70–99)
GLUCOSE-CAPILLARY: 71 mg/dL (ref 70–99)
Glucose-Capillary: 125 mg/dL — ABNORMAL HIGH (ref 70–99)
Glucose-Capillary: 55 mg/dL — ABNORMAL LOW (ref 70–99)
Glucose-Capillary: 58 mg/dL — ABNORMAL LOW (ref 70–99)
Glucose-Capillary: 67 mg/dL — ABNORMAL LOW (ref 70–99)

## 2014-04-04 LAB — CBC
HEMATOCRIT: 30.1 % — AB (ref 39.0–52.0)
HEMOGLOBIN: 10.2 g/dL — AB (ref 13.0–17.0)
MCH: 32.4 pg (ref 26.0–34.0)
MCHC: 33.9 g/dL (ref 30.0–36.0)
MCV: 95.6 fL (ref 78.0–100.0)
Platelets: 61 10*3/uL — ABNORMAL LOW (ref 150–400)
RBC: 3.15 MIL/uL — ABNORMAL LOW (ref 4.22–5.81)
RDW: 15.7 % — ABNORMAL HIGH (ref 11.5–15.5)
WBC: 14.7 10*3/uL — ABNORMAL HIGH (ref 4.0–10.5)

## 2014-04-04 LAB — BASIC METABOLIC PANEL
Anion gap: 12 (ref 5–15)
BUN: 45 mg/dL — ABNORMAL HIGH (ref 6–23)
CALCIUM: 8.1 mg/dL — AB (ref 8.4–10.5)
CO2: 21 meq/L (ref 19–32)
Chloride: 106 mEq/L (ref 96–112)
Creatinine, Ser: 1.53 mg/dL — ABNORMAL HIGH (ref 0.50–1.35)
GFR calc Af Amer: 44 mL/min — ABNORMAL LOW (ref >90)
GFR calc non Af Amer: 38 mL/min — ABNORMAL LOW (ref >90)
GLUCOSE: 66 mg/dL — AB (ref 70–99)
POTASSIUM: 3.9 meq/L (ref 3.7–5.3)
SODIUM: 139 meq/L (ref 137–147)

## 2014-04-04 MED ORDER — FLUTICASONE PROPIONATE 50 MCG/ACT NA SUSP
2.0000 | Freq: Two times a day (BID) | NASAL | Status: DC
  Administered 2014-04-04 – 2014-04-07 (×7): 2 via NASAL
  Filled 2014-04-04: qty 16

## 2014-04-04 MED ORDER — GLUCOSE 40 % PO GEL
1.0000 | ORAL | Status: DC | PRN
  Administered 2014-04-04 – 2014-04-05 (×2): 37.5 g via ORAL
  Filled 2014-04-04 (×2): qty 1

## 2014-04-04 MED ORDER — INSULIN GLARGINE 100 UNIT/ML ~~LOC~~ SOLN
20.0000 [IU] | Freq: Every day | SUBCUTANEOUS | Status: DC
Start: 2014-04-04 — End: 2014-04-05
  Administered 2014-04-04: 20 [IU] via SUBCUTANEOUS
  Filled 2014-04-04: qty 0.2

## 2014-04-04 MED ORDER — SILVER SULFADIAZINE 1 % EX CREA
TOPICAL_CREAM | Freq: Every day | CUTANEOUS | Status: DC
  Administered 2014-04-04 – 2014-04-07 (×4): via TOPICAL
  Filled 2014-04-04: qty 85

## 2014-04-04 MED ORDER — GLUCERNA SHAKE PO LIQD
237.0000 mL | Freq: Two times a day (BID) | ORAL | Status: DC
  Administered 2014-04-04 – 2014-04-07 (×7): 237 mL via ORAL
  Filled 2014-04-04 (×8): qty 237

## 2014-04-04 MED ORDER — MUPIROCIN 2 % EX OINT
1.0000 "application " | TOPICAL_OINTMENT | Freq: Two times a day (BID) | CUTANEOUS | Status: DC
  Administered 2014-04-04 – 2014-04-07 (×7): 1 via NASAL
  Filled 2014-04-04: qty 22

## 2014-04-04 MED ORDER — CHLORHEXIDINE GLUCONATE CLOTH 2 % EX PADS
6.0000 | MEDICATED_PAD | Freq: Every day | CUTANEOUS | Status: DC
  Administered 2014-04-05 – 2014-04-07 (×3): 6 via TOPICAL

## 2014-04-04 MED ORDER — POTASSIUM CHLORIDE CRYS ER 20 MEQ PO TBCR
30.0000 meq | EXTENDED_RELEASE_TABLET | Freq: Two times a day (BID) | ORAL | Status: DC
  Administered 2014-04-04 – 2014-04-06 (×5): 30 meq via ORAL
  Filled 2014-04-04 (×6): qty 1

## 2014-04-04 NOTE — Evaluation (Signed)
Physical Therapy Evaluation Patient Details Name: BOOKER BHATNAGAR MRN: [id number] DOB: [birth year] Today's Date: 04/04/2014   History of Present Illness  80 yo male admitted with sepsis, UTI. hx of HTN, DM, sciatica, vertigo, osteoporosis, hep A, CVA, prostate cancer with mets to lung/ribs. Pt is very HOH  Clinical Impression  On eval,pt required Min assist for mobility-able to ambulate ~50 feet with rolling walker. Demonstrates general weakness, decreased activity tolerance, and impaired gait and balance. Recommend HHPT, 24/7 supervision/assist as long as family can provide current level of care. If family is unable, may need to consider SNF.     Follow Up Recommendations Home health PT;Supervision/Assistance - 24 hour (as long as family can provide current level of care. If not, may need to consider SNf placement. )    Equipment Recommendations  None recommended by PT    Recommendations for Other Services OT consult     Precautions / Restrictions Precautions Precautions: Fall Restrictions Weight Bearing Restrictions: No      Mobility  Bed Mobility Overal bed mobility: Needs Assistance Bed Mobility: Supine to Sit     Supine to sit: Mod assist     General bed mobility comments: Assist for trunk and bil LEs off of bed. Multimodal cues for initiation of task, safety. Increased time.   Transfers Overall transfer level: Needs assistance Equipment used: Rolling walker (2 wheeled) Transfers: Sit to/from Stand Sit to Stand: Min assist         General transfer comment: Assist to rise, stabilize, control descent. Multimodal cues for safety, technique,hand placement.   Ambulation/Gait Ambulation/Gait assistance: Min assist Ambulation Distance (Feet): 50 Feet Assistive device: Rolling walker (2 wheeled) Gait Pattern/deviations: Step-through pattern;Trunk flexed;Decreased stride length     General Gait Details: Unsteady. Assist to stabilize pt and maneuver safely with walker  entire distance. Dyspnea 2/4 noted. Pt incontinent of bowels at end of walk.   Stairs            Wheelchair Mobility    Modified Rankin (Stroke Patients Only)       Balance                                             Pertinent Vitals/Pain No indication of pain during session.     Home Living Family/patient expects to be discharged to:: Private residence Living Arrangements: Spouse/significant other               Additional Comments: no family present during PT eval. Unsure of level of assist available/PLOF before this admission    Prior Function Level of Independence: Needs assistance               Hand Dominance        Extremity/Trunk Assessment   Upper Extremity Assessment: Generalized weakness           Lower Extremity Assessment: Generalized weakness      Cervical / Trunk Assessment: Kyphotic  Communication   Communication: HOH  Cognition Arousal/Alertness: Awake/alert Behavior During Therapy: WFL for tasks assessed/performed Overall Cognitive Status: No family/caregiver present to determine baseline cognitive functioning                      General Comments      Exercises        Assessment/Plan    PT Assessment Patient needs continued PT services  PT  Diagnosis Difficulty walking;Generalized weakness   PT Problem List Decreased strength;Decreased activity tolerance;Decreased balance;Decreased mobility;Decreased knowledge of use of DME  PT Treatment Interventions DME instruction;Gait training;Functional mobility training;Therapeutic activities;Therapeutic exercise;Patient/family education;Balance training   PT Goals (Current goals can be found in the Care Plan section) Acute Rehab PT Goals Patient Stated Goal: none stated PT Goal Formulation: Patient unable to participate in goal setting Time For Goal Achievement: 04/18/14 Potential to Achieve Goals: Fair    Frequency Min 3X/week   Barriers to  discharge        Co-evaluation               End of Session Equipment Utilized During Treatment: Gait belt Activity Tolerance: Patient limited by fatigue Patient left: in chair;with call bell/phone within reach;with chair alarm set           Time: 0915-0940 PT Time Calculation (min): 25 min   Charges:   PT Evaluation $Initial PT Evaluation Tier I: 1 Procedure PT Treatments $Gait Training: 8-22 mins $Therapeutic Activity: 8-22 mins   PT G Codes:          Weston Anna, MPT Pager: [phone]

## 2014-04-04 NOTE — Progress Notes (Addendum)
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   Assessment/Plan:  Severe sepsis Likely due to UTI. Patient has early DIC secondary to this .  Sepsis slowly clearing. Afebrile. Low platelets persist. Elevated lactic acid on admission improved.  -continue empiric vancomycin and cefepime . Low Blood pressure on admission responded to IV hydration. Follow blood culture and urine culture. Continue Tylenol when necessary for fever, supportive care with IV fluids and antiemetics.  Acute Encephalopathy Secondary to sepsis. Now resolved. Head CT unremarkable.  DIC Noted for thrombocytopenia,  elevated d-dimer and mildly elevated INR and lites in the setting of sepsis. Monitor with IV hydration. No signs of bleeding.holding ASA.  Elevated troponin Possibly demand ischemia secondary to sepsis. Noted slight ST depression in anteriolateral leads. Patient does report some chest discomfort in the ED. Monitor serial troponin and EKG. Patient had negative stress test about one month back and does not appear to be active ischemia. Cardiology consult appreciated. Agree this is likely demand ischemia. holding aspirin given thrombocytopenia. Cannot use anticoagulation for the same reason even if indicated. 2D echo with normal EF and no wall motion abnormality  Acute kidney injury Possibly secondary hydration and sepsis. Improving  with IV hydration. avoid nephrotoxic agents  Hypokalemia Replenish with KCl.   Diabetes mellitus with episode of hypoglycemia on 7/5-7/6 Reduce lantus to 20 units . on sliding scale insulin.  Monitor closely  Prostate CA with metastases. Follows at [place] home pain medications  CAD Continue statin. Holding aspirin for low platelets  Prolonged QTC of 630 low potassium and magnesium replenished. Improved on f/up EKG. Recheck today  generalized  weakness  physical therapy.  Moderate Malnutrition Nutrition consult appreciated. Added Glucerna BID     Code Status: Full code Family Communication: Wife at Bedside Disposition Plan: Home once improved   Consultants:  cardiology  Procedures:  2-D echo  CT head  Antibiotics:  IV vancomycin and cefepime  HPI/Subjective: - seen an examined this morning. Feels better overall. Very  hard of hearing as his hearing aid not working properly. had low fsg overnight.  Objective: Filed Vitals:   04/04/14 1024  BP: 123/70  Pulse: 57  Temp:   Resp:     Intake/Output Summary (Last 24 hours) at 04/04/14 1050 Last data filed at 04/04/14 0900  Gross per 24 hour  Intake 3013.75 ml  Output    650 ml  Net 2363.75 ml   Filed Weights   04/02/14 1904 04/02/14 2349  Weight: 53.071 kg (117 lb) 53.5 kg (117 lb 15.1 oz)    Exam:   General:   elderly thin built male in no acute distress  HEENT: No pallor, moist oral mucosa  Chest: Fine Bibasilar crackles,   CVS: Normal S1 and S2, no murmurs rub or gallop  Abdomen: Soft, nontender, nondistended, bowel sounds present  Extremities: Warm, no edema, kyphosis  CNS: AAO x3    Data Reviewed: Basic Metabolic Panel:  Recent Labs Lab 04/02/14 1943 04/03/14 0615 04/04/14 0450  NA 139 144 139  K 5.3 3.3* 3.9  CL 103 107 106  CO2 18* 21 21  GLUCOSE 198* 184* 66*  BUN  36* 40* 45*  CREATININE 1.20 1.65* 1.53*  CALCIUM 9.1 9.1 8.1*  MG  --  1.6  --   PHOS  --  2.5  --    Liver Function Tests:  Recent Labs Lab 04/02/14 1943 04/03/14 0615  AST 154* 94*  ALT 53 47  ALKPHOS 280* 161*  BILITOT 0.9 0.5  PROT 7.5 6.5  ALBUMIN 2.9* 2.3*   No  results found for this basename: LIPASE, AMYLASE,  in the last 168 hours No results found for this basename: AMMONIA,  in the last 168 hours CBC:  Recent Labs Lab 04/02/14 1943 04/03/14 0615 04/03/14 0844 04/04/14 0450  WBC 6.6 15.6*  --  14.7*  NEUTROABS 6.4  --   --   --   HGB 13.8 11.5*  --  10.2*  HCT 41.2 34.1*  --  30.1*  MCV 95.8 96.9  --  95.6  PLT 123* 61* 63* 61*   Cardiac Enzymes:  Recent Labs Lab 04/03/14 0005 04/03/14 0615 04/03/14 1208  TROPONINI 0.39* 0.48* 0.50*   BNP (last 3 results)  Recent Labs  12/29/13 1120  PROBNP 2026.0*   CBG:  Recent Labs Lab 04/04/14 0042 04/04/14 0147 04/04/14 0410 04/04/14 0749 04/04/14 0825  GLUCAP 55* 58* 71 64* 67*    Recent Results (from the past 240 hour(s))  MRSA PCR SCREENING     Status: Abnormal   Collection Time    04/03/14  6:17 AM      Result Value Ref Range Status   MRSA by PCR POSITIVE (*) NEGATIVE Final   Comment:            The GeneXpert MRSA Assay (FDA     approved for NASAL specimens     only), is one component of a     comprehensive MRSA colonization     surveillance program. It is not     intended to diagnose MRSA     infection nor to guide or     monitor treatment for     MRSA infections.     RESULT CALLED TO, READ BACK BY AND VERIFIED WITH:     Beverlee Nims [id number] @ 9147 BY J SCOTTON  CLOSTRIDIUM DIFFICILE BY PCR     Status: None   Collection Time    04/03/14  9:28 AM      Result Value Ref Range Status   C difficile by pcr NEGATIVE  NEGATIVE Final   Comment: Performed at [facility]     Studies: Ct Head Wo Contrast  04/02/2014   CLINICAL DATA:  Altered mental status, tremor  EXAM: CT HEAD WITHOUT CONTRAST  TECHNIQUE: Contiguous axial images were obtained from the base of the skull through the vertex without intravenous contrast.  COMPARISON:  02/08/2014  FINDINGS: No evidence of parenchymal hemorrhage or extra-axial fluid collection. No mass lesion, mass effect, or  midline shift.  No CT evidence of acute infarction.  Subcortical white matter and periventricular small vessel ischemic changes. Old left basal ganglia lacunar infarct.  Global cortical atrophy.  No ventriculomegaly.  Near complete opacification of the right maxillary sinus. Partial opacification of the left ethmoid sinus. Mastoid air cells are clear.  No evidence of calvarial fracture.  IMPRESSION: No evidence of acute intracranial abnormality.  Old left basal ganglia lacunar infarct.  Atrophy with small vessel ischemic changes and intracranial atherosclerosis.   Electronically Signed   By: Julian Hy M.D.   On: 04/02/2014 19:54   Dg Chest Port 1  View  04/02/2014   CLINICAL DATA:  Fever and altered mental status  EXAM: PORTABLE CHEST - 1 VIEW  COMPARISON:  12/29/13  FINDINGS: Cardiac shadow is stable. The lungs are well aerated bilaterally. Diffuse interstitial changes are again identified and stable. No acute bony abnormality is seen. Old rib fractures are again noted. Old proximal right humeral fracture is seen as well.  IMPRESSION: Chronic changes without acute abnormality.   Electronically Signed   By: Inez Catalina M.D.   On: 04/02/2014 19:38    Scheduled Meds: . ceFEPime (MAXIPIME) IV  1 g Intravenous Q24H  . docusate sodium  100 mg Oral BID  . feeding supplement (GLUCERNA SHAKE)  237 mL Oral BID BM  . guaiFENesin  600 mg Oral BID  . HYDROcodone-acetaminophen  1 tablet Oral QHS  . insulin aspart  0-9 Units Subcutaneous 6 times per day  . insulin glargine  20 Units Subcutaneous QHS  . lipase/protease/amylase  2 capsule Oral TID WC  . metoprolol succinate  12.5 mg Oral Daily  . oxybutynin  5 mg Oral Daily  . pantoprazole  40 mg Oral Daily  . potassium chloride  30 mEq Oral BID  . simvastatin  10 mg Oral QODAY  . sodium chloride  3 mL Intravenous Q12H  . vancomycin  500 mg Intravenous Q24H   Continuous Infusions: . sodium chloride 75 mL/hr at 04/03/14 1530      Time spent:  35  minutes    Taren Dymek, [place]  [facility] Pager  [phone]. If 7PM-7AM, please contact night-coverage at [email or web address], password [facility] 04/04/2014, 10:50 AM  LOS: 2 days

## 2014-04-04 NOTE — Progress Notes (Signed)
Speech Language Pathology Treatment: Dysphagia  Patient Details Name: Guy [name] MRN: [id number] DOB: 11-05-1919 Today's Date: 04/04/2014 Time: 1779-3903 SLP Time Calculation (min): 26 min  Assessment / Plan / Recommendation Clinical Impression  Pt presents with ongoing dysphagia and clinical indications of aspiration ongoing with thin liquids.  Pt very hard of hearing requiring SLP to communicate with him via writing.  Pt states his dysphagia started in November of last year with his falls.  He admits to significant weight loss around times of surgery.    SLP reviewed previous MBS study from May 2015 with pt and reinforced effective compensation strategies in writing for pt.  Pt admits to frequent choking on water and difficulty swallowing pills (for which he takes them with applesauce).    As CXR 7/4 was negative for acute issues, recommend to continue diet (Dys2/thin) with strict precautions.  As pt ages and becomes more deconditioned, his asp pna and malnutrition risk will likely increase.    Observed pt consuming apple juice and water, delayed swallow and cough noted without pt demonstrating concern - likely due to chronic nature of dysphagia.  Informed pt that SlP role is to help mitigate aspiration not prevent it.    No family present at this time, therefore SLP will follow up with pt/family for family education.    HPI HPI: 80 year old gentleman with history of metastatic prostate cancer diabetes and coronary disease presents with altered mental status in the setting of UTI and sepsis.  Pt has been followed for swallowing.     Pertinent Vitals Afebrile, decreased  SLP Plan  Continue with current plan of care    Recommendations Continue SLP treatment for dysphagia management, pt/family education             Oral Care Recommendations: Oral care BID Follow up Recommendations: None Plan: Continue with current plan of care    Haughton, Basile, [place]  [facility] SLP [phone]

## 2014-04-04 NOTE — Progress Notes (Signed)
Hypoglycemic Event  CBG: 60  Treatment: 15 GM carbohydrate snack  Symptoms: None  Follow-up CBG: Time:0042 CBG Result:55  Possible Reasons for Event: Unknown  Comments/MD notified:    Guy [name] Hatcher M  Remember to initiate Hypoglycemia Order Set & complete

## 2014-04-04 NOTE — Progress Notes (Signed)
Patient ID: Guy [name], male   DOB: [birth year], 80 y.o.   MRN: [id number]    SUBJECTIVE: Patient is stable and comfortable in bed today. He is very hard of hearing. He has no complaints of chest pain.   Filed Vitals:   04/03/14 0430 04/03/14 1412 04/03/14 2006 04/04/14 0441  BP: 93/79 111/47 123/47 127/54  Pulse: 66 65 60 56  Temp: 97.9 F (36.6 C) 98.6 F (37 C) 97.8 F (36.6 C) 98 F (36.7 C)  TempSrc: Axillary Oral Oral Oral  Resp: 16 16 28 22   Height:      Weight:      SpO2: 97% 100% 99% 98%     Intake/Output Summary (Last 24 hours) at 04/04/14 0718 Last data filed at 04/04/14 0651  Gross per 24 hour  Intake 2653.75 ml  Output    650 ml  Net 2003.75 ml    LABS: Basic Metabolic Panel:  Recent Labs  04/03/14 0615 04/04/14 0450  NA 144 139  K 3.3* 3.9  CL 107 106  CO2 21 21  GLUCOSE 184* 66*  BUN 40* 45*  CREATININE 1.65* 1.53*  CALCIUM 9.1 8.1*  MG 1.6  --   PHOS 2.5  --    Liver Function Tests:  Recent Labs  04/02/14 1943 04/03/14 0615  AST 154* 94*  ALT 53 47  ALKPHOS 280* 161*  BILITOT 0.9 0.5  PROT 7.5 6.5  ALBUMIN 2.9* 2.3*   No results found for this basename: LIPASE, AMYLASE,  in the last 72 hours CBC:  Recent Labs  04/02/14 1943 04/03/14 0615 04/03/14 0844 04/04/14 0450  WBC 6.6 15.6*  --  14.7*  NEUTROABS 6.4  --   --   --   HGB 13.8 11.5*  --  10.2*  HCT 41.2 34.1*  --  30.1*  MCV 95.8 96.9  --  95.6  PLT 123* 61* 63* 61*   Cardiac Enzymes:  Recent Labs  04/03/14 0005 04/03/14 0615 04/03/14 1208  TROPONINI 0.39* 0.48* 0.50*   BNP: No components found with this basename: POCBNP,  D-Dimer:  Recent Labs  04/03/14 0844  DDIMER 18.25*   Hemoglobin A1C:  Recent Labs  04/03/14 0005  HGBA1C 7.8*   Fasting Lipid Panel: No results found for this basename: CHOL, HDL, LDLCALC, TRIG, CHOLHDL, LDLDIRECT,  in the last 72 hours Thyroid Function Tests:  Recent Labs  04/03/14 0615  TSH 1.840     RADIOLOGY: Ct Head Wo Contrast  04/02/2014   CLINICAL DATA:  Altered mental status, tremor  EXAM: CT HEAD WITHOUT CONTRAST  TECHNIQUE: Contiguous axial images were obtained from the base of the skull through the vertex without intravenous contrast.  COMPARISON:  02/08/2014  FINDINGS: No evidence of parenchymal hemorrhage or extra-axial fluid collection. No mass lesion, mass effect, or midline shift.  No CT evidence of acute infarction.  Subcortical white matter and periventricular small vessel ischemic changes. Old left basal ganglia lacunar infarct.  Global cortical atrophy.  No ventriculomegaly.  Near complete opacification of the right maxillary sinus. Partial opacification of the left ethmoid sinus. Mastoid air cells are clear.  No evidence of calvarial fracture.  IMPRESSION: No evidence of acute intracranial abnormality.  Old left basal ganglia lacunar infarct.  Atrophy with small vessel ischemic changes and intracranial atherosclerosis.   Electronically Signed   By: Julian Hy M.D.   On: 04/02/2014 19:54   Ct Pelvis Wo Contrast  03/23/2014   CLINICAL DATA:  Persistent hip pain since May,  also history of prostate carcinoma, evaluate for occult fracture  EXAM: CT PELVIS WITHOUT CONTRAST  TECHNIQUE: Multidetector CT imaging of the pelvis was performed following the standard protocol without intravenous contrast.  COMPARISON:  CT pelvis of 02/08/2014  FINDINGS: The bones are diffusely osteopenic. There is a comminuted fracture of the left superior pubic ramus. In addition a nondisplaced fracture of the left inferior ischial ramus is noted with some callus formation indicating a subacute process. In review of the CT pelvis from 02/08/2014, neither of these fractures is evident. No additional fracture is seen. Bilateral sacral plasty is noted. Degenerative changes are noted both hips with chondrocalcinosis present.  On soft tissue window images, multiple surgical clips obscure anatomic detail of the  lower pelvis after prior prostatectomy. The urinary bladder is decompressed with Foley catheter. No definite pelvic mass or fluid is seen. Penile implant is noted.  IMPRESSION: 1. Comminuted fracture of the left superior pubic ramus. 2. Callus around subacute healing fracture of the inferior left ischial ramus. 3. Severe osteopenia.   Electronically Signed   By: Ivar Drape M.D.   On: 03/23/2014 15:17   Dg Chest Port 1 View  04/02/2014   CLINICAL DATA:  Fever and altered mental status  EXAM: PORTABLE CHEST - 1 VIEW  COMPARISON:  12/29/13  FINDINGS: Cardiac shadow is stable. The lungs are well aerated bilaterally. Diffuse interstitial changes are again identified and stable. No acute bony abnormality is seen. Old rib fractures are again noted. Old proximal right humeral fracture is seen as well.  IMPRESSION: Chronic changes without acute abnormality.   Electronically Signed   By: Inez Catalina M.D.   On: 04/02/2014 19:38    PHYSICAL EXAM patient is comfortable. He is hard of hearing. Lungs reveal few scattered rhonchi. Cardiac exam reveals S1 and S2. Abdomen is soft. There is no peripheral edema.   TELEMETRY: I have reviewed telemetry today April 04, 2014. His rates range from 50-60. Historically he has low amplitude P waves. It is very difficult to see the P waves on the monitor. There is question as to whether he could have slow atrial fibrillation. I am not convinced that this is the case.   ASSESSMENT AND PLAN:     Prostate cancer metastatic to multiple sites   DM (diabetes mellitus)      CAD (coronary artery disease)   Sepsis   UTI (lower urinary tract infection)    Thrombocytopenia, unspecified   DIC (disseminated intravascular coagulation)     With his coagulation abnormality, no antiplatelet or anti-coagulation meds are being recommended.    Elevated troponin     We are treating this as probable demand ischemia. No further workup at this time.    Prolonged Q-T interval on ECG     QT  interval from EKG last night was in the normal range. His T waves are small and QT interval is also difficult to measure.    Ejection fraction     We know that his ejection fraction is 55-60% this admission.    Right bundle-branch block     Patient has old right bundle branch block. There has been no change.  The overall plan is to follow his cardiac status clinically. We'll continue to watch his rhythm. I'm not convinced that he has atrial fibrillation.  Regardless, he is not a candidate for anticoagulation with this current coagulopathy.  Dola Argyle 04/04/2014 7:18 AM

## 2014-04-04 NOTE — Progress Notes (Signed)
INITIAL NUTRITION ASSESSMENT  DOCUMENTATION CODES Per approved criteria  -Non-severe (moderate) malnutrition in the context of chronic illness  Pt meets criteria for moderate MALNUTRITION in the context of chronic illness as evidenced by moderate muscle depletion and subcutaneous fat loss.   INTERVENTION: -Recommend Glucerna Shake po BID, each supplement provides 220 kcal and 10 grams of protein -Diet textures per SLP -Will continue to monitor   NUTRITION DIAGNOSIS: Increased nutrients needs (protein/kcal) related to increased demand for nutrients as evidenced by chronic disease-prostate cancer w/mets.   Goal: Pt to meet >/= 90% of their estimated nutrition needs    Monitor:  Total protein/energy intake, labs, weights, glucose profile  Reason for Assessment: MST/Consult to Assess  80 y.o. male  Admitting Dx: <principal problem not specified>  ASSESSMENT: Patient unable to provide his own history. 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 As well as coronary artery disease and diabetes. Patient lives at home with his wife. EMS was called in because he was having right lower and having difficulty speaking  -Pt unable to provide food/nutrition hx. Per discussion with RN, pt consuming <50% of meals.  -SLP evaluated pt on 7/05, recommended Dys2/thin. RN noted pt tolerating diet textures w/out difficulty -Pt seen by RD during previous admit in 12/2013. Wife noted pt with lactose intolerance, but able to consume Glucerna shakes. Will order BID as pt with consisently low CBG's, and would benefit from additional nutrient replenishment -Weight hx fluctuates per previous medical records. Indicates a possible 10 lbs wt loss in past 2 months, some of which may be attributed to pt's dehydrated state (7.8% body weight loss, significant for time frame) -Phos/Mg/K WNL -Abnormal renal profile likely r/t sepsis and dehydration -Nutrition Focused Physical  Exam:  Subcutaneous Fat:  Orbital Region: WDL Upper Arm Region: moderate wasting Thoracic and Lumbar Region: moderate wastign  Muscle:  Temple Region: moderate wasting Clavicle Bone Region: moderate wasting  Clavicle and Acromion Bone Region: moderate wasting Scapular Bone Region: N/A Dorsal Hand: WDL Patellar Region: WDL Anterior Thigh Region: WDL Posterior Calf Region: WDL  Edema: none noted    Height: Ht Readings from Last 1 Encounters:  04/02/14 5\' 6"  (1.676 m)    Weight: Wt Readings from Last 1 Encounters:  04/02/14 117 lb 15.1 oz (53.5 kg)    Ideal Body Weight: 142 lbs  % Ideal Body Weight: 82%  Wt Readings from Last 10 Encounters:  04/02/14 117 lb 15.1 oz (53.5 kg)  03/09/14 117 lb (53.071 kg)  03/02/14 127 lb (57.607 kg)  01/28/14 127 lb (57.607 kg)  01/05/14 116 lb 10 oz (52.9 kg)  12/27/13 132 lb (59.875 kg)  11/12/13 137 lb 12.8 oz (62.506 kg)  10/07/13 142 lb (64.411 kg)  06/03/13 139 lb (63.05 kg)  04/16/13 136 lb 12.8 oz (62.052 kg)    Usual Body Weight: unable to determine  % Usual Body Weight: unable to detemine  BMI:  Body mass index is 19.05 kg/(m^2).  Estimated Nutritional Needs: Kcal: 1600-1800 Protein: 65-80 gram Fluid: >/= 1800 ml/daily  Skin: MASD on scrotum and sacrum  Diet Order: Dysphagia 2/thin  EDUCATION NEEDS: -No education needs identified at this time   Intake/Output Summary (Last 24 hours) at 04/04/14 0959 Last data filed at 04/04/14 0900  Gross per 24 hour  Intake 3013.75 ml  Output    650 ml  Net 2363.75 ml    Last BM: 7/05- diarrhea   Labs:   Recent Labs Lab 04/02/14 1943 04/03/14 0615  04/04/14 0450  NA 139 144 139  K 5.3 3.3* 3.9  CL 103 107 106  CO2 18* 21 21  BUN 36* 40* 45*  CREATININE 1.20 1.65* 1.53*  CALCIUM 9.1 9.1 8.1*  MG  --  1.6  --   PHOS  --  2.5  --   GLUCOSE 198* 184* 66*    CBG (last 3)   Recent Labs  04/04/14 0410 04/04/14 0749 04/04/14 0825  GLUCAP 71 64* 67*     Scheduled Meds: . ceFEPime (MAXIPIME) IV  1 g Intravenous Q24H  . docusate sodium  100 mg Oral BID  . feeding supplement (GLUCERNA SHAKE)  237 mL Oral BID BM  . guaiFENesin  600 mg Oral BID  . HYDROcodone-acetaminophen  1 tablet Oral QHS  . insulin aspart  0-9 Units Subcutaneous 6 times per day  . insulin glargine  20 Units Subcutaneous QHS  . lipase/protease/amylase  2 capsule Oral TID WC  . metoprolol succinate  12.5 mg Oral Daily  . oxybutynin  5 mg Oral Daily  . pantoprazole  40 mg Oral Daily  . potassium chloride  30 mEq Oral BID  . simvastatin  10 mg Oral QODAY  . sodium chloride  3 mL Intravenous Q12H  . vancomycin  500 mg Intravenous Q24H    Continuous Infusions: . sodium chloride 75 mL/hr at 04/03/14 1530    Past Medical History  Diagnosis Date  . Hypertension   . Diabetes mellitus without complication   . Arthritis   . Sciatica   . GERD (gastroesophageal reflux disease)   . Urge incontinence   . Vertigo   . Hyperlipidemia   . ED (erectile dysfunction)   . Gastric polyposis   . Osteoporosis   . Decreased libido   . Depression   . Hepatitis A   . Stroke   . History of blood transfusion     1946  . OA (osteoarthritis)   . Bladder cancer   . Prostate cancer     S/P prostatectomy; Lung metastasis  . CAD (coronary artery disease) 1996    stent to the LAD   . Hyperlipidemia   . Type 2 diabetes mellitus   . Ejection fraction     Past Surgical History  Procedure Laterality Date  . Carpal tunnel release    . Cataract extraction w/ intraocular lens  implant, bilateral  1998  . Prostatectomy    . Urinary sphincter implant    . Urinary sphincter implant revision    . Appendectomy    . Left hip surgery      Donated bone for bone graft to arm  . Penile prosthesis implant      S/P removal and re-implantation of new prosthesis  . Middle ear surgery    . Finger surgery      Left and right  . Hernia repair    . Bladder surgery    . Right arm bone  graft      Pathological fracture  . Cardiac catheterization  11/27/2001    patent stent with mild in-stent restenosis.  . Myoview  06/12/2009    Persantine myoview EF 76%; Normal myoview  . Coronary angioplasty  1996    stenting to the LAD in [place].     Atlee Abide MS RD LDN Clinical Dietitian [phone]

## 2014-04-04 NOTE — Progress Notes (Signed)
Pt HR Converted to Afib , N.P. Informed and ordered EKG, Result was Sinus Loletha Grayer.On call cardiology informed. No new orders. Will continue to monitor.

## 2014-04-04 NOTE — Progress Notes (Signed)
Hypoglycemic Event  CBG: 58  Treatment: 1 tube instant glucose  Symptoms: None  Follow-up CBG: Time:0410 CBG Result:71  Possible Reasons for Event: Unknown  Comments/MD notified:    Gatlyn Lipari Costella Hatcher M  Remember to initiate Hypoglycemia Order Set & complete

## 2014-04-05 DIAGNOSIS — I4510 Unspecified right bundle-branch block: Secondary | ICD-10-CM

## 2014-04-05 DIAGNOSIS — I248 Other forms of acute ischemic heart disease: Secondary | ICD-10-CM

## 2014-04-05 LAB — CBC
HCT: 33.2 % — ABNORMAL LOW (ref 39.0–52.0)
Hemoglobin: 10.8 g/dL — ABNORMAL LOW (ref 13.0–17.0)
MCH: 31.5 pg (ref 26.0–34.0)
MCHC: 32.5 g/dL (ref 30.0–36.0)
MCV: 96.8 fL (ref 78.0–100.0)
Platelets: 76 10*3/uL — ABNORMAL LOW (ref 150–400)
RBC: 3.43 MIL/uL — AB (ref 4.22–5.81)
RDW: 15.9 % — ABNORMAL HIGH (ref 11.5–15.5)
WBC: 17.7 10*3/uL — ABNORMAL HIGH (ref 4.0–10.5)

## 2014-04-05 LAB — URINE CULTURE
Colony Count: NO GROWTH
Culture: NO GROWTH

## 2014-04-05 LAB — DIC (DISSEMINATED INTRAVASCULAR COAGULATION)PANEL
D-Dimer, Quant: 4.17 ug/mL-FEU — ABNORMAL HIGH (ref 0.00–0.48)
INR: 1.21 (ref 0.00–1.49)
Platelets: 72 10*3/uL — ABNORMAL LOW (ref 150–400)

## 2014-04-05 LAB — GLUCOSE, CAPILLARY
GLUCOSE-CAPILLARY: 82 mg/dL (ref 70–99)
GLUCOSE-CAPILLARY: 153 mg/dL — AB (ref 70–99)
GLUCOSE-CAPILLARY: 181 mg/dL — AB (ref 70–99)
Glucose-Capillary: 106 mg/dL — ABNORMAL HIGH (ref 70–99)
Glucose-Capillary: 59 mg/dL — ABNORMAL LOW (ref 70–99)
Glucose-Capillary: 68 mg/dL — ABNORMAL LOW (ref 70–99)
Glucose-Capillary: 97 mg/dL (ref 70–99)

## 2014-04-05 LAB — BASIC METABOLIC PANEL
ANION GAP: 11 (ref 5–15)
BUN: 36 mg/dL — ABNORMAL HIGH (ref 6–23)
CHLORIDE: 108 meq/L (ref 96–112)
CO2: 18 meq/L — AB (ref 19–32)
CREATININE: 1.23 mg/dL (ref 0.50–1.35)
Calcium: 8.3 mg/dL — ABNORMAL LOW (ref 8.4–10.5)
GFR calc non Af Amer: 49 mL/min — ABNORMAL LOW (ref >90)
GFR, EST AFRICAN AMERICAN: 57 mL/min — AB (ref >90)
Glucose, Bld: 59 mg/dL — ABNORMAL LOW (ref 70–99)
POTASSIUM: 4.7 meq/L (ref 3.7–5.3)
SODIUM: 137 meq/L (ref 137–147)

## 2014-04-05 LAB — DIC (DISSEMINATED INTRAVASCULAR COAGULATION) PANEL
APTT: 46 s — AB (ref 24–37)
Fibrinogen: 413 mg/dL (ref 204–475)
PROTHROMBIN TIME: 15.3 s — AB (ref 11.6–15.2)
Smear Review: NONE SEEN

## 2014-04-05 MED ORDER — HYDROXYZINE HCL 10 MG PO TABS
10.0000 mg | ORAL_TABLET | Freq: Three times a day (TID) | ORAL | Status: DC | PRN
  Administered 2014-04-05 – 2014-04-06 (×3): 10 mg via ORAL
  Filled 2014-04-05 (×3): qty 1

## 2014-04-05 MED ORDER — LEVOFLOXACIN IN D5W 750 MG/150ML IV SOLN
750.0000 mg | INTRAVENOUS | Status: DC
  Administered 2014-04-05 – 2014-04-07 (×2): 750 mg via INTRAVENOUS
  Filled 2014-04-05 (×2): qty 150

## 2014-04-05 MED ORDER — DOCUSATE SODIUM 50 MG/5ML PO LIQD
100.0000 mg | Freq: Two times a day (BID) | ORAL | Status: DC
  Administered 2014-04-05 – 2014-04-07 (×4): 100 mg via ORAL
  Filled 2014-04-05 (×6): qty 10

## 2014-04-05 NOTE — Progress Notes (Signed)
Subjective:  Pt is alert, somewhat confused. No CP/SOB. Hard of hearing  Objective:  Temp:  [97.9 F (36.6 C)-98.5 F (36.9 C)] 98 F (36.7 C) (07/07 0438) Pulse Rate:  [57-64] 62 (07/07 0438) Resp:  [20-28] 28 (07/07 0438) BP: (123-150)/(62-73) 150/64 mmHg (07/07 0438) SpO2:  [97 %-98 %] 98 % (07/07 0438) Weight change:   Intake/Output from previous day: 07/06 0701 - 07/07 0700 In: 2696.3 [P.O.:960; I.V.:1736.3] Out: 1225 [Urine:1225]  Intake/Output from this shift:    Physical Exam: General appearance: alert and no distress Neck: no adenopathy, no carotid bruit, no JVD, supple, symmetrical, trachea midline and thyroid not enlarged, symmetric, no tenderness/mass/nodules Lungs: clear to auscultation bilaterally Heart: regular rate and rhythm, S1, S2 normal, no murmur, click, rub or gallop Extremities: extremities normal, atraumatic, no cyanosis or edema  Lab Results: Results for orders placed during the hospital encounter of 04/02/14 (from the past 48 hour(s))  GLUCOSE, CAPILLARY     Status: Abnormal   Collection Time    04/03/14  7:48 AM      Result Value Ref Range   Glucose-Capillary 125 (*) 70 - 99 mg/dL  PROTIME-INR     Status: Abnormal   Collection Time    04/03/14  8:44 AM      Result Value Ref Range   Prothrombin Time 17.8 (*) 11.6 - 15.2 seconds   INR 1.47  0.00 - 1.49  DIC (DISSEMINATED INTRAVASCULAR COAGULATION) PANEL     Status: Abnormal   Collection Time    04/03/14  8:44 AM      Result Value Ref Range   Prothrombin Time 18.0 (*) 11.6 - 15.2 seconds   INR 1.49  0.00 - 1.49   aPTT 47 (*) 24 - 37 seconds   Comment:            IF BASELINE aPTT IS ELEVATED,     SUGGEST PATIENT RISK ASSESSMENT     BE USED TO DETERMINE APPROPRIATE     ANTICOAGULANT THERAPY.   Fibrinogen 338  204 - 475 mg/dL   D-Dimer, Quant 18.25 (*) 0.00 - 0.48 ug/mL-FEU   Comment:            AT THE INHOUSE ESTABLISHED CUTOFF     VALUE OF 0.48 ug/mL FEU,     THIS ASSAY HAS  BEEN DOCUMENTED     IN THE LITERATURE TO HAVE     A SENSITIVITY AND NEGATIVE     PREDICTIVE VALUE OF AT LEAST     98 TO 99%.  THE TEST RESULT     SHOULD BE CORRELATED WITH     AN ASSESSMENT OF THE CLINICAL     PROBABILITY OF DVT / VTE.   Platelets 63 (*) 150 - 400 K/uL   Comment: RESULT REPEATED AND VERIFIED     SPECIMEN CHECKED FOR CLOTS     CONSISTENT WITH PREVIOUS RESULT   Smear Review NO SCHISTOCYTES SEEN    CLOSTRIDIUM DIFFICILE BY PCR     Status: None   Collection Time    04/03/14  9:28 AM      Result Value Ref Range   C difficile by pcr NEGATIVE  NEGATIVE   Comment: Performed at [place], CAPILLARY     Status: None   Collection Time    04/03/14 11:20 AM      Result Value Ref Range   Glucose-Capillary 92  70 - 99 mg/dL  TROPONIN I     Status:  Abnormal   Collection Time    04/03/14 12:08 PM      Result Value Ref Range   Troponin I 0.50 (*) <0.30 ng/mL   Comment:            Due to the release kinetics of cTnI,     a negative result within the first hours     of the onset of symptoms does not rule out     myocardial infarction with certainty.     If myocardial infarction is still suspected,     repeat the test at appropriate intervals.     CRITICAL VALUE NOTED.  VALUE IS CONSISTENT WITH PREVIOUSLY REPORTED AND CALLED VALUE.  LACTIC ACID, PLASMA     Status: Abnormal   Collection Time    04/03/14  2:48 PM      Result Value Ref Range   Lactic Acid, Venous 2.3 (*) 0.5 - 2.2 mmol/L  GLUCOSE, CAPILLARY     Status: Abnormal   Collection Time    04/03/14  4:54 PM      Result Value Ref Range   Glucose-Capillary 112 (*) 70 - 99 mg/dL  GLUCOSE, CAPILLARY     Status: Abnormal   Collection Time    04/03/14  8:04 PM      Result Value Ref Range   Glucose-Capillary 109 (*) 70 - 99 mg/dL   Comment 1 Documented in Chart     Comment 2 Notify RN    GLUCOSE, CAPILLARY     Status: Abnormal   Collection Time    04/04/14 12:01 AM      Result Value Ref Range    Glucose-Capillary 60 (*) 70 - 99 mg/dL   Comment 1 Documented in Chart     Comment 2 Notify RN    GLUCOSE, CAPILLARY     Status: Abnormal   Collection Time    04/04/14 12:42 AM      Result Value Ref Range   Glucose-Capillary 55 (*) 70 - 99 mg/dL   Comment 1 Notify RN     Comment 2 Documented in Chart    GLUCOSE, CAPILLARY     Status: Abnormal   Collection Time    04/04/14  1:47 AM      Result Value Ref Range   Glucose-Capillary 58 (*) 70 - 99 mg/dL  GLUCOSE, CAPILLARY     Status: None   Collection Time    04/04/14  4:10 AM      Result Value Ref Range   Glucose-Capillary 71  70 - 99 mg/dL   Comment 1 Notify RN     Comment 2 Documented in Chart    CBC     Status: Abnormal   Collection Time    04/04/14  4:50 AM      Result Value Ref Range   WBC 14.7 (*) 4.0 - 10.5 K/uL   RBC 3.15 (*) 4.22 - 5.81 MIL/uL   Hemoglobin 10.2 (*) 13.0 - 17.0 g/dL   HCT 30.1 (*) 39.0 - 52.0 %   MCV 95.6  78.0 - 100.0 fL   MCH 32.4  26.0 - 34.0 pg   MCHC 33.9  30.0 - 36.0 g/dL   RDW 15.7 (*) 11.5 - 15.5 %   Platelets 61 (*) 150 - 400 K/uL   Comment: SPECIMEN CHECKED FOR CLOTS     CONSISTENT WITH PREVIOUS RESULT  BASIC METABOLIC PANEL     Status: Abnormal   Collection Time    04/04/14  4:50 AM  Result Value Ref Range   Sodium 139  137 - 147 mEq/L   Potassium 3.9  3.7 - 5.3 mEq/L   Chloride 106  96 - 112 mEq/L   CO2 21  19 - 32 mEq/L   Glucose, Bld 66 (*) 70 - 99 mg/dL   BUN 45 (*) 6 - 23 mg/dL   Creatinine, Ser 1.53 (*) 0.50 - 1.35 mg/dL   Calcium 8.1 (*) 8.4 - 10.5 mg/dL   GFR calc non Af Amer 38 (*) >90 mL/min   GFR calc Af Amer 44 (*) >90 mL/min   Comment: (NOTE)     The eGFR has been calculated using the CKD EPI equation.     This calculation has not been validated in all clinical situations.     eGFR's persistently <90 mL/min signify possible Chronic Kidney     Disease.   Anion gap 12  5 - 15  GLUCOSE, CAPILLARY     Status: Abnormal   Collection Time    04/04/14  7:49 AM       Result Value Ref Range   Glucose-Capillary 64 (*) 70 - 99 mg/dL  GLUCOSE, CAPILLARY     Status: Abnormal   Collection Time    04/04/14  8:25 AM      Result Value Ref Range   Glucose-Capillary 67 (*) 70 - 99 mg/dL  GLUCOSE, CAPILLARY     Status: Abnormal   Collection Time    04/04/14 11:44 AM      Result Value Ref Range   Glucose-Capillary 136 (*) 70 - 99 mg/dL  GLUCOSE, CAPILLARY     Status: Abnormal   Collection Time    04/04/14  4:15 PM      Result Value Ref Range   Glucose-Capillary 148 (*) 70 - 99 mg/dL  GLUCOSE, CAPILLARY     Status: Abnormal   Collection Time    04/04/14  8:07 PM      Result Value Ref Range   Glucose-Capillary 154 (*) 70 - 99 mg/dL   Comment 1 Documented in Chart     Comment 2 Notify RN    GLUCOSE, CAPILLARY     Status: Abnormal   Collection Time    04/05/14 12:02 AM      Result Value Ref Range   Glucose-Capillary 68 (*) 70 - 99 mg/dL   Comment 1 Documented in Chart     Comment 2 Notify RN    CBC     Status: Abnormal   Collection Time    04/05/14  4:07 AM      Result Value Ref Range   WBC 17.7 (*) 4.0 - 10.5 K/uL   RBC 3.43 (*) 4.22 - 5.81 MIL/uL   Hemoglobin 10.8 (*) 13.0 - 17.0 g/dL   HCT 33.2 (*) 39.0 - 52.0 %   MCV 96.8  78.0 - 100.0 fL   MCH 31.5  26.0 - 34.0 pg   MCHC 32.5  30.0 - 36.0 g/dL   RDW 15.9 (*) 11.5 - 15.5 %   Platelets 76 (*) 150 - 400 K/uL   Comment: CONSISTENT WITH PREVIOUS RESULT  BASIC METABOLIC PANEL     Status: Abnormal   Collection Time    04/05/14  4:07 AM      Result Value Ref Range   Sodium 137  137 - 147 mEq/L   Potassium 4.7  3.7 - 5.3 mEq/L   Comment: DELTA CHECK NOTED     REPEATED TO VERIFY   Chloride 108  96 - 112 mEq/L   CO2 18 (*) 19 - 32 mEq/L   Glucose, Bld 59 (*) 70 - 99 mg/dL   BUN 36 (*) 6 - 23 mg/dL   Creatinine, Ser 1.23  0.50 - 1.35 mg/dL   Calcium 8.3 (*) 8.4 - 10.5 mg/dL   GFR calc non Af Amer 49 (*) >90 mL/min   GFR calc Af Amer 57 (*) >90 mL/min   Comment: (NOTE)     The eGFR has been  calculated using the CKD EPI equation.     This calculation has not been validated in all clinical situations.     eGFR's persistently <90 mL/min signify possible Chronic Kidney     Disease.   Anion gap 11  5 - 15  GLUCOSE, CAPILLARY     Status: Abnormal   Collection Time    04/05/14  4:16 AM      Result Value Ref Range   Glucose-Capillary 59 (*) 70 - 99 mg/dL  GLUCOSE, CAPILLARY     Status: None   Collection Time    04/05/14  6:01 AM      Result Value Ref Range   Glucose-Capillary 97  70 - 99 mg/dL    Imaging: Imaging results have been reviewed  Assessment/Plan:   1. Active Problems: 2.   Prostate cancer metastatic to multiple sites 3.   DM (diabetes mellitus) 4.   Dehydration 5.   CAD (coronary artery disease) 6.   Sepsis 7.   UTI (lower urinary tract infection) 8.   Thrombocytopenia, unspecified 9.   DIC (disseminated intravascular coagulation) 10.   Elevated troponin 11.   Prolonged Q-T interval on ECG 12.   Ejection fraction 13.   RBBB 14.   Malnutrition of moderate degree 15.   Hypoglycemia 16.   Acute kidney injury 17.   Time Spent Directly with Patient:  20 minutes  Length of Stay:  LOS: 3 days   Pt well known to be from being his primary cardiologist for the past 20 years. He was admitted with what appears to be Urosepsis. On ATBX. He has nl LV Fxn by 2D and a neg Myoview last month. Trop mildly elevated, doubt secondary to active ischemia (prob demand ischemia from sepsis). He is in NSR. At this point there are no active cardiac issues Will sign off but will be available for any questions. Placement and Code Situation need to be addressed.   Lorretta Harp 04/05/2014, 7:19 AM

## 2014-04-05 NOTE — Progress Notes (Signed)
Hypoglycemic Event  CBG: 59  Treatment: 1 tube instant glucose  Symptoms: None  Follow-up CBG: Time:0600 CBG Result:91  Possible Reasons for Event: Unknown  Comments/MD notified:    Guy [name]  Remember to initiate Hypoglycemia Order Set & complete

## 2014-04-05 NOTE — Progress Notes (Signed)
Inpatient Diabetes Program Recommendations  AACE/ADA: New Consensus Statement on Inpatient Glycemic Control (2013)  Target Ranges:  Prepandial:   less than 140 mg/dL      Peak postprandial:   less than 180 mg/dL (1-2 hours)      Critically ill patients:  140 - 180 mg/dL  Results for AMDREW, OBOYLE (MRN [id number]) as of 04/05/2014 10:47  Ref. Range 04/04/2014 20:07 04/05/2014 00:02 04/05/2014 04:16 04/05/2014 06:01 04/05/2014 07:28  Glucose-Capillary Latest Range: 70-99 mg/dL 154 (H) 68 (L) 59 (L) 97 82   Inpatient Diabetes Program Recommendations Insulin - Basal: consider further reduction of Lantus dose Correction (SSI): change Novolog to TID instead of Q4 Thank you  Raoul Pitch BSN, RN,CDE Inpatient Diabetes Coordinator [phone] (team pager)

## 2014-04-05 NOTE — Progress Notes (Addendum)
[facility] PROGRESS NOTE  KRISHAWN VANDERWEELE WUJ:[id number] DOB: 06-08-1920 DOA: 04/02/2014 PCP: [name], MD   Brief narrative 80 year old male with history of hypertension, diabetes mellitus, GERD, history of stroke, metastatic prostate cancer with chronic foley to the lungs and ribs (follows at [facility]), CAD with history of cardiac cath in 2004 and recent negative stress test (one month back) presented with sepsis likely due to UTI. Foley was changed in ED. Patient started on IV vanco and cefepime and admitted to telemetry.   Assessment/Plan:  Severe sepsis Likely due to UTI. Patient has early DIC secondary to this .  Sepsis slowly clearing. Afebrile. Low platelets slowly improving. ddimer and PT improved in am labs Elevated lactic acid on admission improved.  -on empiric vancomycin and cefepime ( day 4) , switch to oral levaquin today to treat for 10 day course. cx have been negative so far. . Low Blood pressure on admission responded to IV hydration.  Continue Tylenol when necessary for fever, supportive care with IV fluids and antiemetics.  Acute Encephalopathy Secondary to sepsis. Now resolved. Head CT unremarkable.  DIC Noted for thrombocytopenia,  elevated d-dimer and mildly elevated INR , PT and APTT in the setting of sepsis. Slowly improved.  No signs of bleeding.holding ASA.  Elevated troponin Possibly demand ischemia secondary to sepsis. Noted slight ST depression in anteriolateral leads. Patient does report some chest discomfort in the ED. Monitor serial troponin and EKG. Patient had negative stress test about one month back and does not appear to be active ischemia. Cardiology consult appreciated. Agree this is likely demand ischemia. holding aspirin given thrombocytopenia. Cannot use anticoagulation for the same reason even if indicated. 2D echo with normal EF and no wall motion abnormality  Acute kidney injury Possibly secondary hydration and sepsis.  Improving  with IV hydration. avoid nephrotoxic agents  Hypokalemia Replenish with KCl.   Diabetes mellitus with episode of hypoglycemia on 7/5-7/6 fsg still on lower side despite reducing lantus to 20 u daily. Likely due to poor po intake. Will hold lantus and monitor on on sliding scale insulin. Monitor closely  Prostate CA with metastases. Follows at [place] home pain medications. Has chronic foley  CAD Continue statin. Holding aspirin for low platelets  Prolonged QTC of 630 low potassium and magnesium replenished. Improved to 480  on f/up EKG.   generalized  weakness  seen by physical therapy. Recommends home with 24 hrs supervision. If services unavailable , recommend SNF. Wife undecided. Wants him to go to [facility] if he has to . Will consult SW.  Moderate Malnutrition Nutrition consult appreciated. Added Glucerna BID     Code Status: Full code Family Communication: spoke with wife over the phone Disposition Plan: possibly Home with [place] in 48 hrs if afebrile and labs stable.   Consultants:  cardiology  Procedures:  2-D echo  CT head  Antibiotics:  IV vancomycin and cefepime (7/4-7/7)  levaquin 7/7 till 7/13  HPI/Subjective: - seen an examined this morning. Feels better overall. Very  hard of hearing.   Objective: Filed Vitals:   04/05/14 1020  BP:   Pulse: 65  Temp:   Resp:     Intake/Output Summary (Last 24 hours) at 04/05/14 1126 Last data filed at 04/05/14 0854  Gross per 24 hour  Intake 2576.25 ml  Output   1225 ml  Net 1351.25 ml   Filed Weights   04/02/14 1904 04/02/14 2349  Weight: 53.071 kg (117 lb) 53.5 kg (117 lb 15.1  oz)    Exam:   General:   elderly thin built male in no acute distress  HEENT: No pallor, moist oral mucosa  Chest: clear b/l, no added sounds  CVS: Normal S1 and S2, no murmurs rub or gallop  Abdomen: Soft, nontender, nondistended, bowel sounds present, chronic foley in  place  Extremities: Warm, no edema, kyphosis  CNS: AAO x3    Data Reviewed: Basic Metabolic Panel:  Recent Labs Lab 04/02/14 1943 04/03/14 0615 04/04/14 0450 04/05/14 0407  NA 139 144 139 137  K 5.3 3.3* 3.9 4.7  CL 103 107 106 108  CO2 18* 21 21 18*  GLUCOSE 198* 184* 66* 59*  BUN 36* 40* 45* 36*  CREATININE 1.20 1.65* 1.53* 1.23  CALCIUM 9.1 9.1 8.1* 8.3*  MG  --  1.6  --   --   PHOS  --  2.5  --   --    Liver Function Tests:  Recent Labs Lab 04/02/14 1943 04/03/14 0615  AST 154* 94*  ALT 53 47  ALKPHOS 280* 161*  BILITOT 0.9 0.5  PROT 7.5 6.5  ALBUMIN 2.9* 2.3*   No results found for this basename: LIPASE, AMYLASE,  in the last 168 hours No results found for this basename: AMMONIA,  in the last 168 hours CBC:  Recent Labs Lab 04/02/14 1943 04/03/14 0615 04/03/14 0844 04/04/14 0450 04/05/14 0407 04/05/14 0807  WBC 6.6 15.6*  --  14.7* 17.7*  --   NEUTROABS 6.4  --   --   --   --   --   HGB 13.8 11.5*  --  10.2* 10.8*  --   HCT 41.2 34.1*  --  30.1* 33.2*  --   MCV 95.8 96.9  --  95.6 96.8  --   PLT 123* 61* 63* 61* 76* 72*   Cardiac Enzymes:  Recent Labs Lab 04/03/14 0005 04/03/14 0615 04/03/14 1208  TROPONINI 0.39* 0.48* 0.50*   BNP (last 3 results)  Recent Labs  12/29/13 1120  PROBNP 2026.0*   CBG:  Recent Labs Lab 04/04/14 2007 04/05/14 0002 04/05/14 0416 04/05/14 0601 04/05/14 0728  GLUCAP 154* 68* 59* 97 82    Recent Results (from the past 240 hour(s))  MRSA PCR SCREENING     Status: Abnormal   Collection Time    04/03/14  6:17 AM      Result Value Ref Range Status   MRSA by PCR POSITIVE (*) NEGATIVE Final   Comment:            The GeneXpert MRSA Assay (FDA     approved for NASAL specimens     only), is one component of a     comprehensive MRSA colonization     surveillance program. It is not     intended to diagnose MRSA     infection nor to guide or     monitor treatment for     MRSA infections.      RESULT CALLED TO, READ BACK BY AND VERIFIED WITH:     Beverlee Nims [id number] @ 5631 BY J SCOTTON  CLOSTRIDIUM DIFFICILE BY PCR     Status: None   Collection Time    04/03/14  9:28 AM      Result Value Ref Range Status   C difficile by pcr NEGATIVE  NEGATIVE Final   Comment: Performed at [place]     Status: None   Collection Time    04/04/14  9:15 AM      Result Value Ref Range Status   Specimen Description URINE, RANDOM   Final   Special Requests NONE   Final   Culture  Setup Time     Final   Value: 04/04/2014 13:39     Performed at [place]     Final   Value: NO GROWTH     Performed at [facility]   Culture     Final   Value: NO GROWTH     Performed at [facility]   Report Status 04/05/2014 FINAL   Final     Studies: No results found.  Scheduled Meds: . Chlorhexidine Gluconate Cloth  6 each Topical Q0600  . docusate  100 mg Oral BID  . feeding supplement (GLUCERNA SHAKE)  237 mL Oral BID BM  . fluticasone  2 spray Each Nare BID  . guaiFENesin  600 mg Oral BID  . HYDROcodone-acetaminophen  1 tablet Oral QHS  . insulin aspart  0-9 Units Subcutaneous 6 times per day  . insulin glargine  20 Units Subcutaneous QHS  . levofloxacin (LEVAQUIN) IV  750 mg Intravenous Q48H  . lipase/protease/amylase  2 capsule Oral TID WC  . metoprolol succinate  12.5 mg Oral Daily  . mupirocin ointment  1 application Nasal BID  . oxybutynin  5 mg Oral Daily  . pantoprazole  40 mg Oral Daily  . potassium chloride  30 mEq Oral BID  . silver sulfADIAZINE   Topical Daily  . simvastatin  10 mg Oral QODAY  . sodium chloride  3 mL Intravenous Q12H   Continuous Infusions: . sodium chloride 75 mL/hr at 04/05/14 0419      Time spent:  25 minutes    Lubna Stegeman, [place]  [facility] Pager  [phone]. If 7PM-7AM, please contact night-coverage at [email or web address], password [facility] 04/05/2014, 11:26 AM  LOS: 3 days

## 2014-04-05 NOTE — Progress Notes (Signed)
Physical Therapy Treatment Patient Details Name: AASHRITH EVES MRN: [id number] DOB: 28-Apr-1920 Today's Date: 04/05/2014    History of Present Illness 80 yo male admitted with sepsis, UTI. hx of HTN, DM, sciatica, vertigo, osteoporosis, hep A, CVA, prostate cancer with mets to lung/ribs. Pt is very HOH    PT Comments    Noted increased shortness of breath this session. Pt declined ambulation in hallway on today due to fatigue. Assisted to [facility] then to recliner with use of walker. Pt reports he has an aide that comes out daily.   Follow Up Recommendations  Home health PT;Supervision/Assistance - 24 hour (as long as family can provide current level of assist)    Equipment Recommendations  None recommended by PT    Recommendations for Other Services OT consult     Precautions / Restrictions Precautions Precautions: Fall Restrictions Weight Bearing Restrictions: No    Mobility  Bed Mobility Overal bed mobility: Needs Assistance Bed Mobility: Supine to Sit;Sit to Supine     Supine to sit: Min guard     General bed mobility comments: close guard for safety. Increased time. Used bedrail.   Transfers Overall transfer level: Needs assistance Equipment used: Rolling walker (2 wheeled) Transfers: Sit to/from [facility] Sit to Stand: Min assist Stand pivot transfers: Min assist       General transfer comment: Assist to rise, stabilize, control descent. Multimodal cues for safety, technique,hand placement. LOb x1 posteriorly while pt was attempting to perform toilet hygiene.   Ambulation/Gait Ambulation/Gait assistance: Min assist Ambulation Distance (Feet): 4 Feet Assistive device: Rolling walker (2 wheeled) Gait Pattern/deviations: Trunk flexed;Decreased stride length;Decreased step length - left;Decreased step length - right;Step-through pattern     General Gait Details: took a few steps from [facility] to reclliner. Pt SOB-requested to sit in recliner-declined  ambulation due to fatigue.    Stairs            Wheelchair Mobility    Modified Rankin (Stroke Patients Only)       Balance                                    Cognition Arousal/Alertness: Awake/alert Behavior During Therapy: WFL for tasks assessed/performed Overall Cognitive Status: Within Functional Limits for tasks assessed                      Exercises General Exercises - Lower Extremity Ankle Circles/Pumps: AROM;Both;10 reps;Seated Long Arc Quad: AROM;Both;10 reps;Seated Hip ABduction/ADduction: AAROM;Both;10 reps;Seated    General Comments        Pertinent Vitals/Pain Pt denied pain    Home Living                      Prior Function            PT Goals (current goals can now be found in the care plan section) Progress towards PT goals: Progressing toward goals (slowly)    Frequency  Min 3X/week    PT Plan Current plan remains appropriate    Co-evaluation             End of Session Equipment Utilized During Treatment: Gait belt Activity Tolerance: Patient limited by fatigue Patient left: in chair;with call bell/phone within reach;with chair alarm set     Time: 1041-1104 PT Time Calculation (min): 23 min  Charges:  $Therapeutic Activity: 23-37 mins  G Codes:      Weston Anna, MPT Pager: [phone]

## 2014-04-06 DIAGNOSIS — I10 Essential (primary) hypertension: Secondary | ICD-10-CM

## 2014-04-06 DIAGNOSIS — D638 Anemia in other chronic diseases classified elsewhere: Secondary | ICD-10-CM

## 2014-04-06 LAB — BASIC METABOLIC PANEL
ANION GAP: 10 (ref 5–15)
BUN: 29 mg/dL — ABNORMAL HIGH (ref 6–23)
CALCIUM: 8.6 mg/dL (ref 8.4–10.5)
CO2: 18 meq/L — AB (ref 19–32)
Chloride: 108 mEq/L (ref 96–112)
Creatinine, Ser: 1.18 mg/dL (ref 0.50–1.35)
GFR calc Af Amer: 60 mL/min — ABNORMAL LOW (ref >90)
GFR, EST NON AFRICAN AMERICAN: 52 mL/min — AB (ref >90)
Glucose, Bld: 114 mg/dL — ABNORMAL HIGH (ref 70–99)
Potassium: 5.1 mEq/L (ref 3.7–5.3)
SODIUM: 136 meq/L — AB (ref 137–147)

## 2014-04-06 LAB — CBC
HCT: 32.9 % — ABNORMAL LOW (ref 39.0–52.0)
Hemoglobin: 11.1 g/dL — ABNORMAL LOW (ref 13.0–17.0)
MCH: 32.2 pg (ref 26.0–34.0)
MCHC: 33.7 g/dL (ref 30.0–36.0)
MCV: 95.4 fL (ref 78.0–100.0)
PLATELETS: 75 10*3/uL — AB (ref 150–400)
RBC: 3.45 MIL/uL — AB (ref 4.22–5.81)
RDW: 15.7 % — ABNORMAL HIGH (ref 11.5–15.5)
WBC: 11.4 10*3/uL — ABNORMAL HIGH (ref 4.0–10.5)

## 2014-04-06 LAB — GLUCOSE, CAPILLARY
GLUCOSE-CAPILLARY: 112 mg/dL — AB (ref 70–99)
GLUCOSE-CAPILLARY: 184 mg/dL — AB (ref 70–99)
Glucose-Capillary: 112 mg/dL — ABNORMAL HIGH (ref 70–99)
Glucose-Capillary: 137 mg/dL — ABNORMAL HIGH (ref 70–99)
Glucose-Capillary: 184 mg/dL — ABNORMAL HIGH (ref 70–99)
Glucose-Capillary: 194 mg/dL — ABNORMAL HIGH (ref 70–99)

## 2014-04-06 NOTE — Progress Notes (Signed)
Speech Language Pathology Treatment: Dysphagia  Patient Details Name: Guy [name] MRN: [id number] DOB: 1920-07-11 Today's Date: 04/06/2014 Time: 2263-3354 SLP Time Calculation (min): 30 min  Assessment / Plan / Recommendation Clinical Impression  Called by RN to see pt due to wife's and nurse's observations of increased difficulty swallowing today.  His diet has been downgrade to dysphagia 1, thin liquids.  Pt consumed applesauce and water, requiring moderate cues to follow precautions.  Presented with multiple swallows/bolus in an effort to transfer material, c/o globus, regurgitation into oral cavity followed by consistent cough - S/S are consistent with an esophageal dysphagia.  Pt's wife repeatedly asserted concerns about her husband's ability to eat.  Discussed with Dr. Hartford Poli; decision made to proceed with esophagram next date.  Informed wife who is very pleased with plan.  SLP will follow for results.    HPI HPI: 80 year old gentleman with history of metastatic prostate cancer diabetes and coronary disease presents with altered mental status in the setting of UTI and sepsis.  Pt has been followed for swallowing.        SLP Plan  Continue with current plan of care    Recommendations Diet recommendations: Dysphagia 1 (puree);Thin liquid Liquids provided via: Cup Medication Administration: Whole meds with puree Supervision: Staff to assist with self feeding Compensations: Slow rate;Small sips/bites;Clear throat intermittently Postural Changes and/or Swallow Maneuvers: Seated upright 90 degrees;Upright 30-60 min after meal              Oral Care Recommendations: Oral care BID Follow up Recommendations: Skilled Nursing facility Plan: Continue with current plan of care   Guy [name] L. Tivis Ringer, [place] CCC/SLP Pager [phone]      Guy [name] 04/06/2014, 2:33 PM

## 2014-04-06 NOTE — Progress Notes (Signed)
[facility] PROGRESS NOTE  Guy [name] EXH:[id number] DOB: 1920/09/17 DOA: 04/02/2014 PCP: [name], MD  HPI/Subjective: Denies any pain or other significant complaints.  Brief narrative 80 year old male with history of hypertension, diabetes mellitus, GERD, history of stroke, metastatic prostate cancer with chronic foley to the lungs and ribs (follows at [facility]), CAD with history of cardiac cath in 2004 and recent negative stress test (one month back) presented with sepsis likely due to UTI. Foley was changed in ED. Patient started on IV vanco and cefepime and admitted to telemetry.  Assessment/Plan:  Severe sepsis -Likely due to UTI. Patient has early DIC secondary to this .   -Sepsis physiology resolved. Low platelets slowly improving. ddimer and PT improved in am labs Elevated lactic acid on admission improved.  -On empiric vancomycin and cefepime ( day 4) , switch to oral levaquin today to treat for 10 day course. cx have been negative so far.  -Low Blood pressure on admission responded to IV hydration.  Continue Tylenol when necessary for fever, supportive care with IV fluids and antiemetics.  Acute Encephalopathy Secondary to sepsis. Now resolved. Head CT unremarkable.  DIC Noted for thrombocytopenia,  elevated d-dimer and mildly elevated INR , PT and APTT in the setting of sepsis. Slowly improved.  No signs of bleeding.holding ASA.  Elevated troponin Possibly demand ischemia secondary to sepsis. Noted slight ST depression in anteriolateral leads. Patient does report some chest discomfort in the ED. Monitor serial troponin and EKG. Patient had negative stress test about one month back and does not appear to be active ischemia. Cardiology consult appreciated. Agree this is likely demand ischemia. holding aspirin given thrombocytopenia. Cannot use anticoagulation for the same reason even if indicated. 2D echo with normal EF and no wall motion abnormality  Acute  kidney injury Possibly secondary hydration and sepsis. Improving  with IV hydration. avoid nephrotoxic agents  Hypokalemia Replenish with KCl.   Diabetes mellitus with episode of hypoglycemia on 7/5-7/6 fsg still on lower side despite reducing lantus to 20 u daily. Likely due to poor po intake. Will hold lantus and monitor on on sliding scale insulin. Monitor closely  Prostate CA with metastases. Follows at [place] home pain medications. Has chronic foley  CAD Continue statin. Holding aspirin for low platelets  Prolonged QTC of 630 low potassium and magnesium replenished. Improved to 480  on f/up EKG.   generalized  weakness  seen by physical therapy. Recommends home with 24 hrs supervision. If services unavailable , recommend SNF. Wife undecided. Wants him to go to [facility] if he has to . Will consult SW.  Moderate Malnutrition Nutrition consult appreciated. Added Glucerna BID     Code Status: Full code Family Communication: spoke with wife over the phone Disposition Plan: Discharge in a.m. if stable.   Consultants:  cardiology  Procedures:  2-D echo  CT head  Antibiotics:  IV vancomycin and cefepime (7/4-7/7)  levaquin 7/7 till 7/13     Objective: Filed Vitals:   04/06/14 0547  BP: 150/71  Pulse: 63  Temp: 97.9 F (36.6 C)  Resp: 20    Intake/Output Summary (Last 24 hours) at 04/06/14 1020 Last data filed at 04/06/14 1014  Gross per 24 hour  Intake   1620 ml  Output   2275 ml  Net   -655 ml   Filed Weights   04/02/14 1904 04/02/14 2349  Weight: 53.071 kg (117 lb) 53.5 kg (117 lb 15.1 oz)    Exam:  General:   elderly thin built male in no acute distress  HEENT: No pallor, moist oral mucosa  Chest: clear b/l, no added sounds  CVS: Normal S1 and S2, no murmurs rub or gallop  Abdomen: Soft, nontender, nondistended, bowel sounds present, chronic foley in place  Extremities: Warm, no edema, kyphosis  CNS: AAO  x3    Data Reviewed: Basic Metabolic Panel:  Recent Labs Lab 04/02/14 1943 04/03/14 0615 04/04/14 0450 04/05/14 0407 04/06/14 0403  NA 139 144 139 137 136*  K 5.3 3.3* 3.9 4.7 5.1  CL 103 107 106 108 108  CO2 18* 21 21 18* 18*  GLUCOSE 198* 184* 66* 59* 114*  BUN 36* 40* 45* 36* 29*  CREATININE 1.20 1.65* 1.53* 1.23 1.18  CALCIUM 9.1 9.1 8.1* 8.3* 8.6  MG  --  1.6  --   --   --   PHOS  --  2.5  --   --   --    Liver Function Tests:  Recent Labs Lab 04/02/14 1943 04/03/14 0615  AST 154* 94*  ALT 53 47  ALKPHOS 280* 161*  BILITOT 0.9 0.5  PROT 7.5 6.5  ALBUMIN 2.9* 2.3*   No results found for this basename: LIPASE, AMYLASE,  in the last 168 hours No results found for this basename: AMMONIA,  in the last 168 hours CBC:  Recent Labs Lab 04/02/14 1943 04/03/14 0615 04/03/14 0844 04/04/14 0450 04/05/14 0407 04/05/14 0807 04/06/14 0403  WBC 6.6 15.6*  --  14.7* 17.7*  --  11.4*  NEUTROABS 6.4  --   --   --   --   --   --   HGB 13.8 11.5*  --  10.2* 10.8*  --  11.1*  HCT 41.2 34.1*  --  30.1* 33.2*  --  32.9*  MCV 95.8 96.9  --  95.6 96.8  --  95.4  PLT 123* 61* 63* 61* 76* 72* 75*   Cardiac Enzymes:  Recent Labs Lab 04/03/14 0005 04/03/14 0615 04/03/14 1208  TROPONINI 0.39* 0.48* 0.50*   BNP (last 3 results)  Recent Labs  12/29/13 1120  PROBNP 2026.0*   CBG:  Recent Labs Lab 04/05/14 1654 04/05/14 2027 04/05/14 2359 04/06/14 0352 04/06/14 0728  GLUCAP 181* 106* 137* 112* 112*    Recent Results (from the past 240 hour(s))  MRSA PCR SCREENING     Status: Abnormal   Collection Time    04/03/14  6:17 AM      Result Value Ref Range Status   MRSA by PCR POSITIVE (*) NEGATIVE Final   Comment:            The GeneXpert MRSA Assay (FDA     approved for NASAL specimens     only), is one component of a     comprehensive MRSA colonization     surveillance program. It is not     intended to diagnose MRSA     infection nor to guide or      monitor treatment for     MRSA infections.     RESULT CALLED TO, READ BACK BY AND VERIFIED WITH:     Beverlee Nims [id number] @ 4481 BY J SCOTTON  CLOSTRIDIUM DIFFICILE BY PCR     Status: None   Collection Time    04/03/14  9:28 AM      Result Value Ref Range Status   C difficile by pcr NEGATIVE  NEGATIVE Final   Comment: Performed at [facility]  URINE CULTURE     Status: None   Collection Time    04/04/14  9:15 AM      Result Value Ref Range Status   Specimen Description URINE, RANDOM   Final   Special Requests NONE   Final   Culture  Setup Time     Final   Value: 04/04/2014 13:39     Performed at [place]     Final   Value: NO GROWTH     Performed at [facility]   Culture     Final   Value: NO GROWTH     Performed at [facility]   Report Status 04/05/2014 FINAL   Final     Studies: No results found.  Scheduled Meds: . Chlorhexidine Gluconate Cloth  6 each Topical Q0600  . docusate  100 mg Oral BID  . feeding supplement (GLUCERNA SHAKE)  237 mL Oral BID BM  . fluticasone  2 spray Each Nare BID  . guaiFENesin  600 mg Oral BID  . HYDROcodone-acetaminophen  1 tablet Oral QHS  . insulin aspart  0-9 Units Subcutaneous 6 times per day  . levofloxacin (LEVAQUIN) IV  750 mg Intravenous Q48H  . lipase/protease/amylase  2 capsule Oral TID WC  . metoprolol succinate  12.5 mg Oral Daily  . mupirocin ointment  1 application Nasal BID  . oxybutynin  5 mg Oral Daily  . pantoprazole  40 mg Oral Daily  . potassium chloride  30 mEq Oral BID  . silver sulfADIAZINE   Topical Daily  . simvastatin  10 mg Oral QODAY  . sodium chloride  3 mL Intravenous Q12H   Continuous Infusions: . sodium chloride 75 mL/hr (04/06/14 0918)      Time spent:  25 minutes    [facility] Pager  [phone]. If 7PM-7AM, please contact night-coverage at [email or web address], password [facility] 04/06/2014, 10:20 AM  LOS: 4 days

## 2014-04-06 NOTE — Progress Notes (Signed)
Physical Therapy Treatment Patient Details Name: Guy [name] MRN: [id number] DOB: July 11, 1920 Today's Date: 04/06/2014    History of Present Illness 80 yo male admitted with sepsis, UTI. hx of HTN, DM, sciatica, vertigo, osteoporosis, hep A, CVA, prostate cancer with mets to lung/ribs. Pt is very HOH    PT Comments    Progressing with mobility. Discussed d/c plan-pt states he may d/c to rehab. Wife not present during session. RN states she will page me when wife arrives so therapist can discuss plan with her as well.   Follow Up Recommendations  SNF;Supervision/Assistance - 24 hour (unless wife feels she can safely manage with pt at home)     Equipment Recommendations  None recommended by PT    Recommendations for Other Services       Precautions / Restrictions Precautions Precautions: Fall Restrictions Weight Bearing Restrictions: No    Mobility  Bed Mobility Overal bed mobility: Needs Assistance Bed Mobility: Supine to Sit     Supine to sit: Min guard;HOB elevated     General bed mobility comments: close guard for safety. Increased time. Used bedrail.   Transfers Overall transfer level: Needs assistance Equipment used: Rolling walker (2 wheeled) Transfers: Sit to/from [facility] Sit to Stand: Min assist Stand pivot transfers: Min assist       General transfer comment: Assist to rise, stabilize, control descent. Multimodal cues for safety, technique,hand placement.  Ambulation/Gait Ambulation/Gait assistance: Min assist Ambulation Distance (Feet): 70 Feet Assistive device: Rolling walker (2 wheeled) Gait Pattern/deviations: Trunk flexed;Decreased stride length;Step-through pattern;Decreased step length - left;Decreased step length - right         Stairs            Wheelchair Mobility    Modified Rankin (Stroke Patients Only)       Balance Overall balance assessment: Needs assistance;History of Falls         Standing  balance support: Bilateral upper extremity supported;During functional activity Standing balance-Leahy Scale: Poor                      Cognition Arousal/Alertness: Awake/alert Behavior During Therapy: WFL for tasks assessed/performed Overall Cognitive Status: Within Functional Limits for tasks assessed                      Exercises      General Comments        Pertinent Vitals/Pain No c/o pain    Home Living                      Prior Function            PT Goals (current goals can now be found in the care plan section) Progress towards PT goals: Progressing toward goals    Frequency  Min 3X/week    PT Plan Current plan remains appropriate    Co-evaluation             End of Session Equipment Utilized During Treatment: Gait belt Activity Tolerance: Patient tolerated treatment well Patient left: in chair;with call bell/phone within reach;with chair alarm set     Time: 1005-1033 PT Time Calculation (min): 28 min  Charges:  $Gait Training: 8-22 mins $Therapeutic Activity: 8-22 mins                    G Codes:      Weston Anna, MPT Pager: [phone]

## 2014-04-06 NOTE — Progress Notes (Signed)
Clinical Social Work Department BRIEF PSYCHOSOCIAL ASSESSMENT 04/06/2014  Patient:  Guy [name], Guy [name]     Account Number:  [id number]     Admit date:  04/02/2014  Clinical Social Worker:  Lacie Scotts  Date/Time:  04/06/2014 10:54 AM  Referred by:  Physician  Date Referred:  04/06/2014 Referred for  SNF Placement   Other Referral:   Interview type:  Family Other interview type:    PSYCHOSOCIAL DATA Living Status:  WIFE Admitted from facility:   Level of care:   Primary support name:  [name] Primary support relationship to patient:  SPOUSE Degree of support available:   supportive    CURRENT CONCERNS Current Concerns  Post-Acute Placement   Other Concerns:    SOCIAL WORK ASSESSMENT / PLAN  Assessment/plan status:   Other assessment/ plan:   Pt is a 80 yr old gentleman living at home with spouse prior to hospitalization. CSW met with pt and spoke with spouse / daughter to assist with d/c planning. PT recommends SNF vs 24/7 supervision. Spouse is unable to manage pt's care at this time and feels ST Rehab is needed. Pt has been to [facility] in the past. SNF search has been initiated and [employer] contacted. A decision from SNF is pending. Additional SNF options will be available, if needed.   Information/referral to community resources:   insurance coverage for SNF and ambulance transport reviewed.    PATIENT'S/FAMILY'S RESPONSE TO PLAN OF CARE: Pt is very HOH and gave CSW permission to speak with his family. Pt's family supports ST Rehab placement and will talk with pt about this at  today's visit. Family is hopeful [facility] will have an opening.   Werner Lean LCSW [phone]

## 2014-04-06 NOTE — Progress Notes (Signed)
ST Rehab bed available at [facility] Thursday if pt is ready for D/C. CSW will continue to follow to assist with d/c planning to SNF.  Werner Lean LCSW [phone]

## 2014-04-06 NOTE — Progress Notes (Signed)
Clinical Social Work Department CLINICAL SOCIAL WORK PLACEMENT NOTE 04/06/2014  Patient:  HOLGER, SOKOLOWSKI  Account Number:  [id number] Admit date:  04/02/2014  Clinical Social Worker:  Werner Lean, LCSW  Date/time:  04/06/2014 11:03 AM  Clinical Social Work is seeking post-discharge placement for this patient at the following level of care:   SKILLED NURSING   (*CSW will update this form in Epic as items are completed)     Patient/family provided with [place] Department of Clinical Social Work's list of facilities offering this level of care within the geographic area requested by the patient (or if unable, by the patient's family).  04/06/2014  Patient/family informed of their freedom to choose among providers that offer the needed level of care, that participate in Medicare, Medicaid or managed care program needed by the patient, have an available bed and are willing to accept the patient.    Patient/family informed of [facility]' ownership interest in [facility], as well as of the fact that they are under no obligation to receive care at this facility.  PASARR submitted to EDS on 04/06/2014 PASARR number received on 04/06/2014  FL2 transmitted to all facilities in geographic area requested by pt/family on  04/06/2014 FL2 transmitted to all facilities within larger geographic area on   Patient informed that his/her managed care company has contracts with or will negotiate with  certain facilities, including the following:     Patient/family informed of bed offers received:   Patient chooses bed at  Physician recommends and patient chooses bed at    Patient to be transferred to  on   Patient to be transferred to facility by  Patient and family notified of transfer on  Name of family member notified:    The following physician request were entered in Epic:   Additional Comments:  Werner Lean LCSW [phone]

## 2014-04-07 ENCOUNTER — Inpatient Hospital Stay (HOSPITAL_COMMUNITY): Payer: Medicare Other

## 2014-04-07 DIAGNOSIS — D72829 Elevated white blood cell count, unspecified: Secondary | ICD-10-CM

## 2014-04-07 LAB — BASIC METABOLIC PANEL
Anion gap: 10 (ref 5–15)
BUN: 26 mg/dL — ABNORMAL HIGH (ref 6–23)
CO2: 21 mEq/L (ref 19–32)
Calcium: 8.8 mg/dL (ref 8.4–10.5)
Chloride: 105 mEq/L (ref 96–112)
Creatinine, Ser: 1.17 mg/dL (ref 0.50–1.35)
GFR, EST AFRICAN AMERICAN: 61 mL/min — AB (ref >90)
GFR, EST NON AFRICAN AMERICAN: 53 mL/min — AB (ref >90)
Glucose, Bld: 90 mg/dL (ref 70–99)
Potassium: 4.4 mEq/L (ref 3.7–5.3)
SODIUM: 136 meq/L — AB (ref 137–147)

## 2014-04-07 LAB — CBC
HCT: 32.1 % — ABNORMAL LOW (ref 39.0–52.0)
Hemoglobin: 10.5 g/dL — ABNORMAL LOW (ref 13.0–17.0)
MCH: 31.9 pg (ref 26.0–34.0)
MCHC: 32.7 g/dL (ref 30.0–36.0)
MCV: 97.6 fL (ref 78.0–100.0)
PLATELETS: 68 10*3/uL — AB (ref 150–400)
RBC: 3.29 MIL/uL — AB (ref 4.22–5.81)
RDW: 15.5 % (ref 11.5–15.5)
WBC: 8.6 10*3/uL (ref 4.0–10.5)

## 2014-04-07 LAB — GLUCOSE, CAPILLARY
GLUCOSE-CAPILLARY: 124 mg/dL — AB (ref 70–99)
Glucose-Capillary: 91 mg/dL (ref 70–99)
Glucose-Capillary: 116 mg/dL — ABNORMAL HIGH (ref 70–99)

## 2014-04-07 MED ORDER — GUAIFENESIN 100 MG/5ML PO SYRP
200.0000 mg | ORAL_SOLUTION | ORAL | Status: DC
  Administered 2014-04-07 (×2): 200 mg via ORAL
  Filled 2014-04-07 (×6): qty 10

## 2014-04-07 MED ORDER — LEVOFLOXACIN 750 MG PO TABS: 750.0000 mg | ORAL_TABLET | Freq: Every day | ORAL | Status: DC

## 2014-04-07 MED ORDER — HYDROXYZINE HCL 25 MG PO TABS: 25.0000 mg | ORAL_TABLET | Freq: Three times a day (TID) | ORAL | Status: DC | PRN

## 2014-04-07 MED ORDER — IOHEXOL 300 MG/ML  SOLN
150.0000 mL | Freq: Once | INTRAMUSCULAR | Status: AC | PRN
  Administered 2014-04-07: 20 mL via ORAL

## 2014-04-07 MED ORDER — HYDROCODONE-ACETAMINOPHEN 5-325 MG PO TABS: 1.0000 | ORAL_TABLET | Freq: Every day | ORAL | Status: DC

## 2014-04-07 NOTE — Progress Notes (Signed)
Clinical Social Work Department CLINICAL SOCIAL WORK PLACEMENT NOTE 04/07/2014  Patient:  Guy [name], Guy [name]  Account Number:  [id number] Admit date:  04/02/2014  Clinical Social Worker:  Werner Lean, LCSW  Date/time:  04/06/2014 11:03 AM  Clinical Social Work is seeking post-discharge placement for this patient at the following level of care:   SKILLED NURSING   (*CSW will update this form in Epic as items are completed)     Patient/family provided with [place] Department of Clinical Social Work's list of facilities offering this level of care within the geographic area requested by the patient (or if unable, by the patient's family).  04/06/2014  Patient/family informed of their freedom to choose among providers that offer the needed level of care, that participate in Medicare, Medicaid or managed care program needed by the patient, have an available bed and are willing to accept the patient.    Patient/family informed of [facility] ownership interest in [facility], as well as of the fact that they are under no obligation to receive care at this facility.  PASARR submitted to EDS on 04/06/2014 PASARR number received on 04/06/2014  FL2 transmitted to all facilities in geographic area requested by pt/family on  04/06/2014 FL2 transmitted to all facilities within larger geographic area on   Patient informed that his/her managed care company has contracts with or will negotiate with  certain facilities, including the following:     Patient/family informed of bed offers received:  04/06/2014 Patient chooses bed at [place] Physician recommends and patient chooses bed at    Patient to be transferred to [place] on  04/07/2014 Patient to be transferred to facility by [facility] Patient and family notified of transfer on 04/07/2014 Name of family member notified:  Spouse : Guy [name]  The following physician request were entered in Epic:   Additional Comments: Pt /  spouse are in agreement with d/c to SNF via [facility] today. Nsg reviewed d/c summary, avs,scripts. Scripts are included in d/c packet. 2:30 [facility] transport requested.  Werner Lean DP

## 2014-04-07 NOTE — Telephone Encounter (Signed)
Encounter complete. 

## 2014-04-07 NOTE — Progress Notes (Signed)
[facility] arrived for patient disposition to [facility]. Report called to Santiago Glad.

## 2014-04-07 NOTE — Progress Notes (Signed)
Clinical Social Work Department CLINICAL SOCIAL WORK PLACEMENT NOTE 04/07/2014  Patient:  Guy [name], Guy [name]  Account Number:  [id number] Admit date:  04/02/2014  Clinical Social Worker:  Werner Lean, LCSW  Date/time:  04/06/2014 11:03 AM  Clinical Social Work is seeking post-discharge placement for this patient at the following level of care:   SKILLED NURSING   (*CSW will update this form in Epic as items are completed)     Patient/family provided with [place] Department of Clinical Social Work's list of facilities offering this level of care within the geographic area requested by the patient (or if unable, by the patient's family).  04/06/2014  Patient/family informed of their freedom to choose among providers that offer the needed level of care, that participate in Medicare, Medicaid or managed care program needed by the patient, have an available bed and are willing to accept the patient.    Patient/family informed of [facility] ownership interest in [facility], as well as of the fact that they are under no obligation to receive care at this facility.  PASARR submitted to EDS on 04/06/2014 PASARR number received on 04/06/2014  FL2 transmitted to all facilities in geographic area requested by pt/family on  04/06/2014 FL2 transmitted to all facilities within larger geographic area on   Patient informed that his/her managed care company has contracts with or will negotiate with  certain facilities, including the following:     Patient/family informed of bed offers received:  04/06/2014 Patient chooses bed at [place] Physician recommends and patient chooses bed at    Patient to be transferred to [place] on  04/07/2014 Patient to be transferred to facility by [facility] Patient and family notified of transfer on 04/07/2014 Name of family member notified:  Spouse : maxine  The following physician request were entered in Epic:   Additional Comments: Pt /  spouse are in agreement with d/c to SNF via [facility] today. Nsg reviewed d/c summary, avs,scripts. Scripts are included in d/c packet.  Werner Lean LCSW [phone]

## 2014-04-07 NOTE — Discharge Summary (Addendum)
Physician Discharge Summary  Guy [name]:[id number] DOB: [birth year] DOA: 04/02/2014  PCP: [name], MD  Admit date: 04/02/2014 Discharge date: 04/07/2014  Time spent: 40 minutes  Recommendations for Outpatient Follow-up:  1. Followup with primary care physician within one week. 2. Check CBC/BMP within one week, follow platelets count.  Discharge Diagnoses:  Active Problems:   Prostate cancer metastatic to multiple sites   DM (diabetes mellitus)   Dehydration   CAD (coronary artery disease)   Sepsis   UTI (lower urinary tract infection)   Thrombocytopenia, unspecified   DIC (disseminated intravascular coagulation)   Elevated troponin   Prolonged Q-T interval on ECG   Ejection fraction   RBBB   Malnutrition of moderate degree   Hypoglycemia   Acute kidney injury   Discharge Condition: Stable  Diet recommendation: Dysphagia 1 diet  Filed Weights   04/02/14 1904 04/02/14 2349  Weight: 53.071 kg (117 lb) 53.5 kg (117 lb 15.1 oz)    History of present illness:  80 year old male with history of hypertension, diabetes mellitus, GERD, history of stroke, metastatic prostate cancer with chronic foley to the lungs and ribs (follows at [facility]), CAD with history of cardiac cath in 2004 and recent negative stress test (one month back) presented with sepsis likely due to UTI. Foley was changed in ED. Patient started on IV vanco and cefepime and admitted to telemetry.  Hospital Course:   Severe sepsis  -Likely due to UTI. Patient has early DIC secondary to this.  -Sepsis physiology resolved, ddimer and PT improved in am labs Elevated lactic acid on admission improved.  -On empiric vancomycin and cefepime (day 4) , switch to oral Levaquin to treat for 10 day course. cx have been negative so far.  -Low Blood pressure on admission responded to IV hydration.  -Continue Tylenol when necessary for fever, supportive care with IV fluids and antiemetics.  -As mentioned above  blood and urine cultures NGTD, discharge or Levaquin for 7 more days.  Acute Encephalopathy  Secondary to sepsis. Now resolved. Head CT unremarkable.   DIC  Noted for thrombocytopenia, elevated d-dimer and mildly elevated INR , PT and APTT in the setting of sepsis.  Slowly improved. No signs of bleeding. Please note that patient had thrombocytopenia before and 2014 and it lasted for some time. Which was resolved platelets were always in the low side. Even when it gets normal it was be low normal. On discharge platelets are 68, patient needs CBC to be checked within one week.  Elevated troponin  Possibly demand ischemia secondary to sepsis. Noted slight ST depression in anteriolateral leads. Patient does report some chest discomfort in the ED. Monitor serial troponin and EKG. Patient had negative stress test about one month back and does not appear to be active ischemia. Cardiology consult appreciated. Agree this is likely demand ischemia. holding aspirin given thrombocytopenia. Cannot use anticoagulation for the same reason even if indicated.  2D echo with normal EF and no wall motion abnormality   Acute kidney injury  Possibly secondary hydration and sepsis. Improving with IV hydration. avoid nephrotoxic agents   Hypokalemia  Replenish with KCl.   Diabetes mellitus with episode of hypoglycemia on 7/5-7/6  fsg still on lower side despite reducing lantus to 20 u daily. Likely due to poor po intake. Will hold lantus and monitor on on sliding scale insulin. Monitor closely   Prostate CA with metastases.  Follows at [facility] home pain medications. Has chronic foley   CAD  Continue statin. Holding aspirin for low platelets   Prolonged QTC of 630  low potassium and magnesium replenished. Improved to 480 on f/up EKG.   generalized weakness  seen by physical therapy. Recommends home with 24 hrs supervision. If services unavailable , recommend SNF. Wife undecided. Wants him to  go to [facility] if he has to . Will consult SW.   Moderate Malnutrition  Nutrition consult appreciated.   Dysphagia Patient seen by SLP and recommended dysphagia 2 diet, patient was not tolerating that very well. So switched to dysphagia 1 diet, please let SLP to follow patient in the nursing home. Patient had esophagogram which showed no esophageal dysmotility.    Procedures:  None  Consultations:  Cardiology  Discharge Exam: Filed Vitals:   04/07/14 0436  BP: 165/75  Pulse: 65  Temp: 98.2 F (36.8 C)  Resp: 20   General: Alert and awake, oriented x3, not in any acute distress. HOH HEENT: anicteric sclera, pupils reactive to light and accommodation, EOMI CVS: S1-S2 clear, no murmur rubs or gallops Chest: clear to auscultation bilaterally, no wheezing, rales or rhonchi Abdomen: soft nontender, nondistended, normal bowel sounds, no organomegaly Extremities: no cyanosis, clubbing or edema noted bilaterally Neuro: Cranial nerves II-XII intact, no focal neurological deficits  Discharge Instructions You were cared for by a hospitalist during your hospital stay. If you have any questions about your discharge medications or the care you received while you were in the hospital after you are discharged, you can call the unit and asked to speak with the hospitalist on call if the hospitalist that took care of you is not available. Once you are discharged, your primary care physician will handle any further medical issues. Please note that NO REFILLS for any discharge medications will be authorized once you are discharged, as it is imperative that you return to your primary care physician (or establish a relationship with a primary care physician if you do not have one) for your aftercare needs so that they can reassess your need for medications and monitor your lab values.  Discharge Instructions   Diet Carb Modified    Complete by:  As directed      Increase activity slowly     Complete by:  As directed             Medication List         amLODipine 10 MG tablet  Commonly known as:  NORVASC  Take 5 mg by mouth daily.     aspirin 81 MG tablet  Take 81 mg by mouth daily.     bicalutamide 50 MG tablet  Commonly known as:  CASODEX  Take 50 mg by mouth daily.     CALCIUM 600+D HIGH POTENCY 600-400 MG-UNIT per tablet  Generic drug:  Calcium Carbonate-Vitamin D  Take 1 tablet by mouth 2 (two) times daily.     camphor-menthol lotion  Commonly known as:  SARNA  Apply 1 application topically daily as needed for itching.     CRANBERRY CONCENTRATE PO  Take 4 capsules by mouth daily.     fluticasone 50 MCG/ACT nasal spray  Commonly known as:  FLONASE  Place 2 sprays into the nose 2 (two) times daily.     HYDROcodone-acetaminophen 5-325 MG per tablet  Commonly known as:  NORCO/VICODIN  Take 1 tablet by mouth at bedtime.     hydrOXYzine 25 MG tablet  Commonly known as:  ATARAX/VISTARIL  Take 1 tablet (25 mg total) by mouth  3 (three) times daily as needed for anxiety or itching.     insulin aspart 100 UNIT/ML injection  Commonly known as:  novoLOG  Inject 0-6 Units into the skin 3 (three) times daily with meals. 0-150=0; 151-200=2u; 201-250=3u; 251-300=4u; 301-350=5u; over 350 =6u prior to meals. With breakfast and lunch.     insulin glargine 100 UNIT/ML injection  Commonly known as:  LANTUS  Inject 28 Units into the skin at bedtime.     ivermectin 3 MG Tabs tablet  Commonly known as:  STROMECTOL  Take 12 mg by mouth once a week.     levofloxacin 750 MG tablet  Commonly known as:  LEVAQUIN  Take 1 tablet (750 mg total) by mouth daily.     lipase/protease/amylase 12000 UNITS Cpep capsule  Commonly known as:  CREON-12/PANCREASE  Take 2 capsules by mouth 3 (three) times daily.     Melatonin 5 MG Tabs  Take 5 mg by mouth at bedtime.     metoprolol succinate 25 MG 24 hr tablet  Commonly known as:  TOPROL-XL  Take 12.5 mg by mouth daily.      miconazole 2 % powder  Commonly known as:  MICOTIN  Apply 1 application topically daily as needed for itching.     multivitamins ther. w/minerals Tabs tablet  Take 1 tablet by mouth every morning.     nitroGLYCERIN 0.4 MG SL tablet  Commonly known as:  NITROSTAT  Place 0.4 mg under the tongue.     omeprazole 20 MG capsule  Commonly known as:  PRILOSEC  Take 20 mg by mouth 2 (two) times daily before a meal.     oxybutynin 5 MG tablet  Commonly known as:  DITROPAN  Take 5 mg by mouth daily.     sertraline 100 MG tablet  Commonly known as:  ZOLOFT  Take 100 mg by mouth 2 (two) times daily.     silver sulfADIAZINE 1 % cream  Commonly known as:  SILVADENE  Apply 1 application topically daily.     simvastatin 20 MG tablet  Commonly known as:  ZOCOR  Take 10 mg by mouth every other day.       Allergies  Allergen Reactions  . Albuterol Sulfate Hfa [Kdc:Albuterol] Shortness Of Breath and Swelling  . Adhesive [Tape] Other (See Comments)    REACTION: SKIN BLISTERS  . Albuterol Swelling    Per pt., side of his neck began to swell with nebulized Albuterol in doctor's office.  . Contrast Media [Iodinated Diagnostic Agents] Other (See Comments)    Reaction unknown  . Lactose     Other reaction(s): GI Upset (intolerance)  . Metrizamide Other (See Comments)    Reaction unknown  . Penicillins Other (See Comments) and Itching    REACTION: ITCHING HANDS  . Sulfa Drugs Cross Reactors Hives  . Budesonide-Formoterol Fumarate Rash  . Sulfamethoxazole Rash       Follow-up Information   Follow up with ALTHEIMER,MICHAEL D, MD In 1 week.   Specialty:  Endocrinology   Contact information:   [place] [id number] [phone]        The results of significant diagnostics from this hospitalization (including imaging, microbiology, ancillary and laboratory) are listed below for reference.    Significant Diagnostic Studies: Ct Head Wo Contrast  04/02/2014    CLINICAL DATA:  Altered mental status, tremor  EXAM: CT HEAD WITHOUT CONTRAST  TECHNIQUE: Contiguous axial images were obtained from the base of the skull through  the vertex without intravenous contrast.  COMPARISON:  02/08/2014  FINDINGS: No evidence of parenchymal hemorrhage or extra-axial fluid collection. No mass lesion, mass effect, or midline shift.  No CT evidence of acute infarction.  Subcortical white matter and periventricular small vessel ischemic changes. Old left basal ganglia lacunar infarct.  Global cortical atrophy.  No ventriculomegaly.  Near complete opacification of the right maxillary sinus. Partial opacification of the left ethmoid sinus. Mastoid air cells are clear.  No evidence of calvarial fracture.  IMPRESSION: No evidence of acute intracranial abnormality.  Old left basal ganglia lacunar infarct.  Atrophy with small vessel ischemic changes and intracranial atherosclerosis.   Electronically Signed   By: Julian Hy M.D.   On: 04/02/2014 19:54   Ct Pelvis Wo Contrast  03/23/2014   CLINICAL DATA:  Persistent hip pain since May, also history of prostate carcinoma, evaluate for occult fracture  EXAM: CT PELVIS WITHOUT CONTRAST  TECHNIQUE: Multidetector CT imaging of the pelvis was performed following the standard protocol without intravenous contrast.  COMPARISON:  CT pelvis of 02/08/2014  FINDINGS: The bones are diffusely osteopenic. There is a comminuted fracture of the left superior pubic ramus. In addition a nondisplaced fracture of the left inferior ischial ramus is noted with some callus formation indicating a subacute process. In review of the CT pelvis from 02/08/2014, neither of these fractures is evident. No additional fracture is seen. Bilateral sacral plasty is noted. Degenerative changes are noted both hips with chondrocalcinosis present.  On soft tissue window images, multiple surgical clips obscure anatomic detail of the lower pelvis after prior prostatectomy. The urinary  bladder is decompressed with Foley catheter. No definite pelvic mass or fluid is seen. Penile implant is noted.  IMPRESSION: 1. Comminuted fracture of the left superior pubic ramus. 2. Callus around subacute healing fracture of the inferior left ischial ramus. 3. Severe osteopenia.   Electronically Signed   By: Ivar Drape M.D.   On: 03/23/2014 15:17   Dg Chest Port 1 View  04/02/2014   CLINICAL DATA:  Fever and altered mental status  EXAM: PORTABLE CHEST - 1 VIEW  COMPARISON:  12/29/13  FINDINGS: Cardiac shadow is stable. The lungs are well aerated bilaterally. Diffuse interstitial changes are again identified and stable. No acute bony abnormality is seen. Old rib fractures are again noted. Old proximal right humeral fracture is seen as well.  IMPRESSION: Chronic changes without acute abnormality.   Electronically Signed   By: Inez Catalina M.D.   On: 04/02/2014 19:38    Microbiology: Recent Results (from the past 240 hour(s))  MRSA PCR SCREENING     Status: Abnormal   Collection Time    04/03/14  6:17 AM      Result Value Ref Range Status   MRSA by PCR POSITIVE (*) NEGATIVE Final   Comment:            The GeneXpert MRSA Assay (FDA     approved for NASAL specimens     only), is one component of a     comprehensive MRSA colonization     surveillance program. It is not     intended to diagnose MRSA     infection nor to guide or     monitor treatment for     MRSA infections.     RESULT CALLED TO, READ BACK BY AND VERIFIED WITH:     Beverlee Nims [id number] @ 0748 BY J SCOTTON  CLOSTRIDIUM DIFFICILE BY PCR     Status:  None   Collection Time    04/03/14  9:28 AM      Result Value Ref Range Status   C difficile by pcr NEGATIVE  NEGATIVE Final   Comment: Performed at [facility]  URINE CULTURE     Status: None   Collection Time    04/04/14  9:15 AM      Result Value Ref Range Status   Specimen Description URINE, RANDOM   Final   Special Requests NONE   Final   Culture  Setup Time      Final   Value: 04/04/2014 13:39     Performed at [place]     Final   Value: NO GROWTH     Performed at [facility]   Culture     Final   Value: NO GROWTH     Performed at [facility]   Report Status 04/05/2014 FINAL   Final     Labs: Basic Metabolic Panel:  Recent Labs Lab 04/03/14 0615 04/04/14 0450 04/05/14 0407 04/06/14 0403 04/07/14 0420  NA 144 139 137 136* 136*  K 3.3* 3.9 4.7 5.1 4.4  CL 107 106 108 108 105  CO2 21 21 18* 18* 21  GLUCOSE 184* 66* 59* 114* 90  BUN 40* 45* 36* 29* 26*  CREATININE 1.65* 1.53* 1.23 1.18 1.17  CALCIUM 9.1 8.1* 8.3* 8.6 8.8  MG 1.6  --   --   --   --   PHOS 2.5  --   --   --   --    Liver Function Tests:  Recent Labs Lab 04/02/14 1943 04/03/14 0615  AST 154* 94*  ALT 53 47  ALKPHOS 280* 161*  BILITOT 0.9 0.5  PROT 7.5 6.5  ALBUMIN 2.9* 2.3*   No results found for this basename: LIPASE, AMYLASE,  in the last 168 hours No results found for this basename: AMMONIA,  in the last 168 hours CBC:  Recent Labs Lab 04/02/14 1943 04/03/14 0615  04/04/14 0450 04/05/14 0407 04/05/14 0807 04/06/14 0403 04/07/14 0420  WBC 6.6 15.6*  --  14.7* 17.7*  --  11.4* 8.6  NEUTROABS 6.4  --   --   --   --   --   --   --   HGB 13.8 11.5*  --  10.2* 10.8*  --  11.1* 10.5*  HCT 41.2 34.1*  --  30.1* 33.2*  --  32.9* 32.1*  MCV 95.8 96.9  --  95.6 96.8  --  95.4 97.6  PLT 123* 61*  < > 61* 76* 72* 75* 68*  < > = values in this interval not displayed. Cardiac Enzymes:  Recent Labs Lab 04/03/14 0005 04/03/14 0615 04/03/14 1208  TROPONINI 0.39* 0.48* 0.50*   BNP: BNP (last 3 results)  Recent Labs  12/29/13 1120  PROBNP 2026.0*   CBG:  Recent Labs Lab 04/06/14 1657 04/06/14 2003 04/06/14 2349 04/07/14 0427 04/07/14 0735  GLUCAP 184* 184* 124* 91 116*       Signed:  Mckinnon Glick A  [facility] 04/07/2014, 9:12 AM

## 2014-04-07 NOTE — Plan of Care (Signed)
Problem: Discharge Progression Outcomes Goal: Discharge plan in place and appropriate Outcome: Completed/Met Date Met:  04/07/14 Pt left for rehab, [facility]    Goal: Pain controlled with appropriate interventions Outcome: Completed/Met Date Met:  04/07/14 Pt denies pain

## 2014-04-07 NOTE — Progress Notes (Signed)
Physical Therapy Treatment Patient Details Name: Guy [name] MRN: [id number] DOB: 07/02/1920 Today's Date: April 14, 2014    History of Present Illness 80 yo male admitted with sepsis, UTI. hx of HTN, DM, sciatica, vertigo, osteoporosis, hep A, CVA, prostate cancer with mets to lung/ribs. Pt is very HOH    PT Comments    Pt reports feeling better today and states he will be discharging to SNF.  Follow Up Recommendations  SNF;Supervision/Assistance - 24 hour     Equipment Recommendations  None recommended by PT    Recommendations for Other Services       Precautions / Restrictions Precautions Precautions: Fall    Mobility  Bed Mobility Overal bed mobility: Needs Assistance Bed Mobility: Supine to Sit     Supine to sit: HOB elevated;Supervision     General bed mobility comments: close guard for safety. continues to require Increased time  Transfers Overall transfer level: Needs assistance Equipment used: Rolling walker (2 wheeled) Transfers: Sit to/from Stand Sit to Stand: Min assist Stand pivot transfers: Min assist       General transfer comment: Assist to rise, stabilize, control descent. Multimodal cues for safety, technique,hand placement.  Ambulation/Gait Ambulation/Gait assistance: Min assist Ambulation Distance (Feet): 50 Feet Assistive device: Rolling walker (2 wheeled) Gait Pattern/deviations: Decreased stride length;Trunk flexed     General Gait Details: fatigues quickly, recliner brought behind pt    Stairs            Wheelchair Mobility    Modified Rankin (Stroke Patients Only)       Balance                                    Cognition Arousal/Alertness: Awake/alert Behavior During Therapy: WFL for tasks assessed/performed Overall Cognitive Status: Within Functional Limits for tasks assessed                      Exercises General Exercises - Lower Extremity Ankle Circles/Pumps: AROM;Both;10  reps;Seated Long Arc Quad: AROM;Both;10 reps;Seated Hip ABduction/ADduction: Both;10 reps;Supine;AROM Hip Flexion/Marching: AROM;Seated;Both;10 reps    General Comments        Pertinent Vitals/Pain No c/o pain, repositioned in [place]                      Prior Function            PT Goals (current goals can now be found in the care plan section) Progress towards PT goals: Progressing toward goals    Frequency  Min 3X/week    PT Plan Current plan remains appropriate    Co-evaluation             End of Session   Activity Tolerance: Patient tolerated treatment well Patient left: in chair;with call bell/phone within reach;with chair alarm set     Time: 6503-5465 PT Time Calculation (min): 25 min  Charges:  $Gait Training: 8-22 mins $Therapeutic Activity: 8-22 mins                    G Codes:      Brenn Deziel,KATHrine E 04/14/14, 11:47 AM Carmelia Bake, PT, DPT 04/14/2014 Pager: [phone]

## 2014-04-07 NOTE — Progress Notes (Signed)
Speech Language Pathology Treatment: Dysphagia  Patient Details Name: SHEAMUS HASTING MRN: [id number] DOB: 1920/02/04 Today's Date: 04/07/2014 Time: 2446-2863 SLP Time Calculation (min): 18 min  Assessment / Plan / Recommendation Clinical Impression  Pt educated to findings of esophagram indicating dysmotility and to compensation strategies to mitigate dysphagia.  Using teach back reinforced strategies and provided in writing.  SLP to sign off as all education completed.    HPI HPI: 80 year old gentleman with history of metastatic prostate cancer diabetes and coronary disease presents with altered mental status in the setting of UTI and sepsis.  Pt has been followed for swallowing and underwent esophagram yesterday.  Today is being seen for education.     Pertinent Vitals Afebrile,decreased  SLP Plan  Continue with current plan of care    Recommendations Diet recommendations: Dysphagia 1 (puree);Thin liquid Liquids provided via: Cup;Straw Medication Administration: Whole meds with puree Supervision: Staff to assist with self feeding Compensations: Slow rate;Small sips/bites;Clear throat intermittently;Follow solids with liquid (start meal with liquids, alternate solids/drinks to help clear esophagus, consume room temperature or warm drinks) Postural Changes and/or Swallow Maneuvers: Seated upright 90 degrees;Upright 30-60 min after meal              Oral Care Recommendations: Oral care BID Follow up Recommendations: Skilled Nursing facility Plan: Continue with current plan of care    [place], [place] [facility] SLP [phone]

## 2014-04-08 ENCOUNTER — Encounter: Payer: Self-pay | Admitting: Adult Health

## 2014-04-08 ENCOUNTER — Other Ambulatory Visit: Payer: Self-pay | Admitting: *Deleted

## 2014-04-08 ENCOUNTER — Non-Acute Institutional Stay (SKILLED_NURSING_FACILITY): Payer: Medicare Other | Admitting: Adult Health

## 2014-04-08 DIAGNOSIS — F32A Depression, unspecified: Secondary | ICD-10-CM

## 2014-04-08 DIAGNOSIS — F3289 Other specified depressive episodes: Secondary | ICD-10-CM

## 2014-04-08 DIAGNOSIS — F329 Major depressive disorder, single episode, unspecified: Secondary | ICD-10-CM

## 2014-04-08 DIAGNOSIS — C61 Malignant neoplasm of prostate: Secondary | ICD-10-CM

## 2014-04-08 DIAGNOSIS — N390 Urinary tract infection, site not specified: Secondary | ICD-10-CM

## 2014-04-08 DIAGNOSIS — R5381 Other malaise: Secondary | ICD-10-CM

## 2014-04-08 DIAGNOSIS — E785 Hyperlipidemia, unspecified: Secondary | ICD-10-CM

## 2014-04-08 DIAGNOSIS — E440 Moderate protein-calorie malnutrition: Secondary | ICD-10-CM

## 2014-04-08 DIAGNOSIS — E876 Hypokalemia: Secondary | ICD-10-CM

## 2014-04-08 DIAGNOSIS — J309 Allergic rhinitis, unspecified: Secondary | ICD-10-CM

## 2014-04-08 DIAGNOSIS — D696 Thrombocytopenia, unspecified: Secondary | ICD-10-CM

## 2014-04-08 DIAGNOSIS — K219 Gastro-esophageal reflux disease without esophagitis: Secondary | ICD-10-CM

## 2014-04-08 DIAGNOSIS — E119 Type 2 diabetes mellitus without complications: Secondary | ICD-10-CM

## 2014-04-08 DIAGNOSIS — I10 Essential (primary) hypertension: Secondary | ICD-10-CM

## 2014-04-08 DIAGNOSIS — C800 Disseminated malignant neoplasm, unspecified: Secondary | ICD-10-CM

## 2014-04-08 DIAGNOSIS — R5383 Other fatigue: Secondary | ICD-10-CM

## 2014-04-08 DIAGNOSIS — R531 Weakness: Secondary | ICD-10-CM

## 2014-04-08 MED ORDER — HYDROCODONE-ACETAMINOPHEN 5-325 MG PO TABS: ORAL_TABLET | ORAL | Status: DC

## 2014-04-08 NOTE — Telephone Encounter (Signed)
[facility] 

## 2014-04-08 NOTE — Progress Notes (Signed)
PROGRESS NOTE  DATE: 04/08/2014  FACILITY: Nursing Home Location: [facility]  LEVEL OF CARE: SNF (31)  Acute Visit  CHIEF COMPLAINT:  Follow-up Hospitalization  HISTORY OF PRESENT ILLNESS: This is a 80 year old male who has been admitted to [facility] on 04/07/14 from [facility] with Severe sepsis likely due to UTI and Generalized weakness. He has been admitted for short-term rehabilitation.   REASSESSMENT OF ONGOING PROBLEM(S):  UTI: The UTI remains stable.  The patient denies ongoing suprapubic pain, flank pain, dysuria, urinary frequency, urinary hesitancy or hematuria.  No complications reported from the current antibiotic being used.  HTN: Pt 's HTN remains stable.  Denies CP, sob, DOE, pedal edema, headaches, dizziness or visual disturbances.  No complications from the medications currently being used.  Last BP :148/89  DM:pt's DM remains stable.  Pt denies polyuria, polydipsia, polyphagia, changes in vision or hypoglycemic episodes.  No complications noted from the medication presently being used.   7/15 hemoglobin A1c is: 7.8  GERD: pt's GERD is stable.  Denies ongoing heartburn, abd. Pain, nausea or vomiting.  Currently on a PPI & tolerates it without any adverse reactions.  DEPRESSION: The depression remains stable. Patient denies ongoing feelings of sadness, insomnia, anedhonia or lack of appetite. No complications reported from the medications currently being used. Staff do not report behavioral problems.  ALLERGIC RHINITIS: Allergic rhinitis remains stable.  Patient denies ongoing symptoms such as runny nose sneezing or tearing. No complications reported from the current medication(s) being used.   PAST MEDICAL HISTORY : Reviewed.  No changes/see problem list  CURRENT MEDICATIONS: Reviewed per MAR/see medication list  REVIEW OF SYSTEMS:  GENERAL: no change in appetite, no fatigue, no weight changes, no fever, chills or  weakness RESPIRATORY: no cough, SOB, DOE, wheezing, hemoptysis CARDIAC: no chest pain, edema or palpitations GI: no abdominal pain, diarrhea, constipation, heart burn, nausea or vomiting  PHYSICAL EXAMINATION  GENERAL: no acute distress SKIN: multiple wounds to left forearm with dressing EYES: conjunctivae normal, sclerae normal, normal eye lids NECK: supple, trachea midline, no neck masses, no thyroid tenderness, no thyromegaly LYMPHATICS: no LAN in the neck, no supraclavicular LAN RESPIRATORY: breathing is even & unlabored, BS CTAB CARDIAC: RRR, no murmur,no extra heart sounds, no edema GI: abdomen soft, normal BS, no masses, no tenderness, no hepatomegaly, no splenomegaly GU:  Foley catheter intact with yellowish urine draining to urine bag EXTREMITIES: able to move all 4 extremities PSYCHIATRIC: the patient is alert & oriented to person, affect & behavior appropriate  LABS/RADIOLOGY: Labs reviewed: Basic Metabolic Panel:  Recent Labs  10/07/13 1233 10/07/13 1630  04/03/14 0615  04/05/14 0407 04/06/14 0403 04/07/14 0420  NA 139  --   < > 144  < > 137 136* 136*  K 3.9  --   < > 3.3*  < > 4.7 5.1 4.4  CL 97  --   < > 107  < > 108 108 105  CO2  --   --   < > 21  < > 18* 18* 21  GLUCOSE 190*  --   < > 184*  < > 59* 114* 90  BUN 25*  --   < > 40*  < > 36* 29* 26*  CREATININE 1.40* 1.24  < > 1.65*  < > 1.23 1.18 1.17  CALCIUM  --   --   < > 9.1  < > 8.3* 8.6 8.8  MG  --  1.7  --  1.6  --   --   --   --   PHOS  --  3.3  --  2.5  --   --   --   --   < > = values in this interval not displayed. Liver Function Tests:  Recent Labs  01/02/14 0358 04/02/14 1943 04/03/14 0615  AST 103* 154* 94*  ALT 64* 53 47  ALKPHOS 525* 280* 161*  BILITOT 0.9 0.9 0.5  PROT 7.0 7.5 6.5  ALBUMIN 2.6* 2.9* 2.3*   CBC:  Recent Labs  12/27/13 2208 12/29/13 1120  04/02/14 1943  04/05/14 0407 04/05/14 0807 04/06/14 0403 04/07/14 0420  WBC 11.8* 9.2  < > 6.6  < > 17.7*  --  11.4*  8.6  NEUTROABS 9.9* 6.9  --  6.4  --   --   --   --   --   HGB 13.9 13.1  < > 13.8  < > 10.8*  --  11.1* 10.5*  HCT 41.1 38.8*  < > 41.2  < > 33.2*  --  32.9* 32.1*  MCV 95.6 95.8  < > 95.8  < > 96.8  --  95.4 97.6  PLT 132* 110*  < > 123*  < > 76* 72* 75* 68*  < > = values in this interval not displayed.  Cardiac Enzymes:  Recent Labs  04/03/14 0005 04/03/14 0615 04/03/14 1208  TROPONINI 0.39* 0.48* 0.50*   CBG:  Recent Labs  04/06/14 2349 04/07/14 0427 04/07/14 0735  GLUCAP 124* 91 116*    EXAM: PORTABLE CHEST - 1 VIEW   COMPARISON:  12/29/13   FINDINGS: Cardiac shadow is stable. The lungs are well aerated bilaterally. Diffuse interstitial changes are again identified and stable. No acute bony abnormality is seen. Old rib fractures are again noted. Old proximal right humeral fracture is seen as well.   IMPRESSION: Chronic changes without acute abnormality EXAM: CT HEAD WITHOUT CONTRAST   TECHNIQUE: Contiguous axial images were obtained from the base of the skull through the vertex without intravenous contrast.   COMPARISON:  02/08/2014   FINDINGS: No evidence of parenchymal hemorrhage or extra-axial fluid collection. No mass lesion, mass effect, or midline shift.   No CT evidence of acute infarction.   Subcortical white matter and periventricular small vessel ischemic changes. Old left basal ganglia lacunar infarct.   Global cortical atrophy.  No ventriculomegaly.   Near complete opacification of the right maxillary sinus. Partial opacification of the left ethmoid sinus. Mastoid air cells are clear.   No evidence of calvarial fracture.   IMPRESSION: No evidence of acute intracranial abnormality.   Old left basal ganglia lacunar infarct.   Atrophy with small vessel ischemic changes and intracranial atherosclerosis. EXAM: ESOPHOGRAM/BARIUM SWALLOW   TECHNIQUE: Single contrast examination was performed using water-soluble contrast.     FLUOROSCOPY TIME:  1 min and 28 seconds   COMPARISON:  None.   FINDINGS: Barium swallows did not demonstrate any obvious aspiration. No upper esophageal webs, strictures or diverticuli. Esophageal dysmotility is noted with tertiary contractions, esophageal spasm and mild stasis. No intrinsic or extrinsic mass lesions are identified. No mucosal abnormalities. No hiatal hernia or GE reflux.   IMPRESSION: Nonspecific esophageal dysmotility.   No intrinsic or extrinsic mass lesions.   No obvious aspiration.    ASSESSMENT/PLAN:  UTI - continue Levaquin Prostate CA metastatic to multiple sites - follows-up @ [facility]; chronic foley catheter Diabetes Mellitus, type 2 - continue Novolog and  Lantus Malnutrition, moderate - continue supplementation Thrombocytopenia - for CBC in 1 week Hypokalemia - continue supplementation Generalized weakness - for rehabilitation Hypertension - continue Amlodipine and Toprol XL GERD - stable; continue Omeprazole Depression - continue Zoloft Hyperlipidemia - continue Simvastatin Allergic Rhinitis - stable; continue Flonase   Total patient care time: Greater than 30 minutes   CPT CODE: 37943  Caylin Raby Vargas - NP [facility] [phone]

## 2014-04-11 ENCOUNTER — Encounter (HOSPITAL_BASED_OUTPATIENT_CLINIC_OR_DEPARTMENT_OTHER): Payer: Medicare Other | Attending: Plastic Surgery

## 2014-04-12 ENCOUNTER — Non-Acute Institutional Stay (SKILLED_NURSING_FACILITY): Payer: Medicare Other | Admitting: Internal Medicine

## 2014-04-12 DIAGNOSIS — I2510 Atherosclerotic heart disease of native coronary artery without angina pectoris: Secondary | ICD-10-CM

## 2014-04-12 DIAGNOSIS — E119 Type 2 diabetes mellitus without complications: Secondary | ICD-10-CM

## 2014-04-12 DIAGNOSIS — I10 Essential (primary) hypertension: Secondary | ICD-10-CM

## 2014-04-12 DIAGNOSIS — N390 Urinary tract infection, site not specified: Secondary | ICD-10-CM

## 2014-04-13 DIAGNOSIS — I2510 Atherosclerotic heart disease of native coronary artery without angina pectoris: Secondary | ICD-10-CM | POA: Insufficient documentation

## 2014-04-13 NOTE — Progress Notes (Signed)
HISTORY & PHYSICAL  DATE: 04/12/2014   FACILITY: [facility]  LEVEL OF CARE: SNF (31)  ALLERGIES:  Allergies  Allergen Reactions  . Albuterol Sulfate Hfa [Kdc:Albuterol] Shortness Of Breath and Swelling  . Adhesive [Tape] Other (See Comments)    REACTION: SKIN BLISTERS  . Albuterol Swelling    Per pt., side of his neck began to swell with nebulized Albuterol in doctor's office.  . Contrast Media [Iodinated Diagnostic Agents] Other (See Comments)    Reaction unknown  . Lactose     Other reaction(s): GI Upset (intolerance)  . Metrizamide Other (See Comments)    Reaction unknown  . Penicillins Other (See Comments) and Itching    REACTION: ITCHING HANDS  . Sulfa Drugs Cross Reactors Hives  . Budesonide-Formoterol Fumarate Rash  . Sulfamethoxazole Rash    CHIEF COMPLAINT:  Manage UTI, diabetes mellitus and CAD  HISTORY OF PRESENT ILLNESS: 80 year old Caucasian male was hospitalized secondary to sepsis. After hospital isolation patient is admitted to this facility for short-term rehabilitation.  UTI: The UTI remains stable.  The patient denies ongoing suprapubic pain, flank pain, dysuria, urinary frequency, urinary hesitancy or hematuria.  No complications reported from the current antibiotic being used.  CAD: The angina has been stable. The patient denies dyspnea on exertion, orthopnea, pedal edema, palpitations and paroxysmal nocturnal dyspnea. No complications noted from the medication presently being used.  DM:pt's DM remains stable.  Pt denies polyuria, polydipsia, polyphagia, changes in vision or hypoglycemic episodes.  No complications noted from the medication presently being used.  Last hemoglobin A1c is not available.  PAST MEDICAL HISTORY :  Past Medical History  Diagnosis Date  . Hypertension   . Diabetes mellitus without complication   . Arthritis   . Sciatica   . GERD (gastroesophageal reflux disease)   . Urge incontinence   .  Vertigo   . Hyperlipidemia   . ED (erectile dysfunction)   . Gastric polyposis   . Osteoporosis   . Decreased libido   . Depression   . Hepatitis A   . Stroke   . History of blood transfusion     1946  . OA (osteoarthritis)   . Bladder cancer   . Prostate cancer     S/P prostatectomy; Lung metastasis  . CAD (coronary artery disease) 1996    stent to the LAD   . Hyperlipidemia   . Type 2 diabetes mellitus   . Ejection fraction     PAST SURGICAL HISTORY: Past Surgical History  Procedure Laterality Date  . Carpal tunnel release    . Cataract extraction w/ intraocular lens  implant, bilateral  1998  . Prostatectomy    . Urinary sphincter implant    . Urinary sphincter implant revision    . Appendectomy    . Left hip surgery      Donated bone for bone graft to arm  . Penile prosthesis implant      S/P removal and re-implantation of new prosthesis  . Middle ear surgery    . Finger surgery      Left and right  . Hernia repair    . Bladder surgery    . Right arm bone graft      Pathological fracture  . Cardiac catheterization  11/27/2001    patent stent with mild in-stent restenosis.  . Myoview  06/12/2009    Persantine myoview EF 76%; Normal myoview  . Coronary angioplasty  1996  stenting to the LAD in [place].     SOCIAL HISTORY:  reports that he has quit smoking. He has never used smokeless tobacco. He reports that he does not drink alcohol or use illicit drugs.  FAMILY HISTORY:  Family History  Problem Relation Age of Onset  . Heart failure Mother   . Bladder Cancer Father   . Pancreatic cancer Sister   . Lung cancer Sister   . Breast cancer Daughter   . Liver disease Son     CURRENT MEDICATIONS: Reviewed per MAR/see medication list  REVIEW OF SYSTEMS:  See HPI otherwise 14 point ROS is negative.  PHYSICAL EXAMINATION  VS:  See VS section  GENERAL: no acute distress, normal body habitus EYES: conjunctivae normal, sclerae normal, normal eye  lids MOUTH/THROAT: lips without lesions,no lesions in the mouth,tongue is without lesions,uvula elevates in midline NECK: supple, trachea midline, no neck masses, no thyroid tenderness, no thyromegaly LYMPHATICS: no LAN in the neck, no supraclavicular LAN RESPIRATORY: breathing is even & unlabored, BS CTAB CARDIAC: RRR, no murmur,no extra heart sounds, no edema GI:  ABDOMEN: abdomen soft, normal BS, no masses, no tenderness  LIVER/SPLEEN: no hepatomegaly, no splenomegaly MUSCULOSKELETAL: HEAD: normal to inspection  EXTREMITIES: LEFT UPPER EXTREMITY: full range of motion, normal strength & tone RIGHT UPPER EXTREMITY:  Moderate range of motion, decreased strength & tone LEFT LOWER EXTREMITY:  Minimal range of motion, normal strength & tone RIGHT LOWER EXTREMITY:  full range of motion, normal strength & tone PSYCHIATRIC: the patient is alert & oriented to person, affect & behavior appropriate  LABS/RADIOLOGY:  Labs reviewed: Basic Metabolic Panel:  Recent Labs  10/07/13 1233 10/07/13 1630  04/03/14 0615  04/05/14 0407 04/06/14 0403 04/07/14 0420  NA 139  --   < > 144  < > 137 136* 136*  K 3.9  --   < > 3.3*  < > 4.7 5.1 4.4  CL 97  --   < > 107  < > 108 108 105  CO2  --   --   < > 21  < > 18* 18* 21  GLUCOSE 190*  --   < > 184*  < > 59* 114* 90  BUN 25*  --   < > 40*  < > 36* 29* 26*  CREATININE 1.40* 1.24  < > 1.65*  < > 1.23 1.18 1.17  CALCIUM  --   --   < > 9.1  < > 8.3* 8.6 8.8  MG  --  1.7  --  1.6  --   --   --   --   PHOS  --  3.3  --  2.5  --   --   --   --   < > = values in this interval not displayed. Liver Function Tests:  Recent Labs  01/02/14 0358 04/02/14 1943 04/03/14 0615  AST 103* 154* 94*  ALT 64* 53 47  ALKPHOS 525* 280* 161*  BILITOT 0.9 0.9 0.5  PROT 7.0 7.5 6.5  ALBUMIN 2.6* 2.9* 2.3*   CBC:  Recent Labs  12/27/13 2208 12/29/13 1120  04/02/14 1943  04/05/14 0407 04/05/14 0807 04/06/14 0403 04/07/14 0420  WBC 11.8* 9.2  < > 6.6  < >  17.7*  --  11.4* 8.6  NEUTROABS 9.9* 6.9  --  6.4  --   --   --   --   --   HGB 13.9 13.1  < > 13.8  < > 10.8*  --  11.1* 10.5*  HCT 41.1 38.8*  < > 41.2  < > 33.2*  --  32.9* 32.1*  MCV 95.6 95.8  < > 95.8  < > 96.8  --  95.4 97.6  PLT 132* 110*  < > 123*  < > 76* 72* 75* 68*  < > = values in this interval not displayed.  Cardiac Enzymes:  Recent Labs  04/03/14 0005 04/03/14 0615 04/03/14 1208  TROPONINI 0.39* 0.48* 0.50*   CBG:  Recent Labs  04/06/14 2349 04/07/14 0427 04/07/14 0735  GLUCAP 124* 91 116*   Transthoracic Echocardiography  (Report amended )  Patient:    Wallice, Granville MR #:       [id number] Study Date: 04/03/2014 Gender:     M Age:        33 Height:     167 cm Weight:     53 kg BSA:        1.56 m^2 Pt. Status: Room:       Bowman, Gillis Santa, Anastassia  ATTENDING    [place], [place] [id number]  PERFORMING   [facility], Inpatient  SONOGRAPHER  Jerel Shepherd  cc:  ------------------------------------------------------------------- LV EF: 55% -   60%  ------------------------------------------------------------------- Indications:      CHF - 428.0.  ------------------------------------------------------------------- History:   PMH:   Coronary artery disease.  Risk factors: Hypertension. Diabetes mellitus.  ------------------------------------------------------------------- Study Conclusions  - Left ventricle: The cavity size was normal. There was moderate   concentric hypertrophy. Systolic function was normal. The   estimated ejection fraction was in the range of 55% to 60%. Wall   motion was normal; there were no regional wall motion   abnormalities. Doppler parameters are consistent with abnormal   left ventricular relaxation (grade 1 diastolic dysfunction).   Doppler parameters are consistent with high ventricular filling   pressure. - Aortic valve: Cusp separation was mildly reduced. There was  mild   regurgitation.  Transthoracic echocardiography.  M-mode, complete 2D, spectral Doppler, and color Doppler.  Birthdate:  Patient birthdate: December 12, 1919.  Age:  Patient is 80 yr old.  Sex:  Gender: male. Height:  Height: 167 cm. Height: 65.7 in.  Weight:  Weight: 53 kg. Weight: 116.6 lb.  Body mass index:  BMI: 19 kg/m^2.  Body surface area:    BSA: 1.56 m^2.  Blood pressure:     93/79  Patient status:  Inpatient.  Study date:  Study date: 04/03/2014. Study time: 08:04 AM.  Location:  Bedside.  -------------------------------------------------------------------  ------------------------------------------------------------------- Left ventricle:  The cavity size was normal. There was moderate concentric hypertrophy. Systolic function was normal. The estimated ejection fraction was in the range of 55% to 60%. Wall motion was normal; there were no regional wall motion abnormalities. Doppler parameters are consistent with abnormal left ventricular relaxation (grade 1 diastolic dysfunction). Doppler parameters are consistent with high ventricular filling pressure.  ------------------------------------------------------------------- Aortic valve:   Trileaflet; mildly thickened, mildly calcified leaflets. Cusp separation was mildly reduced.  Doppler: Transvalvular velocity was within the normal range. There was no stenosis. There was mild regurgitation.    Peak gradient (S): 15 mm Hg.  ------------------------------------------------------------------- Aorta:  Aortic root: The aortic root was normal in size.  ------------------------------------------------------------------- Mitral valve:   Structurally normal valve.   Mobility was not restricted.  Doppler:  Transvalvular velocity was within the normal range. There was no evidence for stenosis. There was trivial regurgitation.    Peak gradient (  D): 3 mm  Hg.  ------------------------------------------------------------------- Left atrium:  The atrium was normal in size.  ------------------------------------------------------------------- Right ventricle:  The cavity size was normal. Wall thickness was normal. Systolic function was normal.  ------------------------------------------------------------------- Pulmonic valve:    Structurally normal valve.   Cusp separation was normal.  Doppler:  Transvalvular velocity was within the normal range. There was no evidence for stenosis. There was no regurgitation.  ------------------------------------------------------------------- Tricuspid valve:   Structurally normal valve.    Doppler: Transvalvular velocity was within the normal range. There was trivial regurgitation.  ------------------------------------------------------------------- Pulmonary artery:   The main pulmonary artery was normal-sized. Systolic pressure was within the normal range.  ------------------------------------------------------------------- Right atrium:  The atrium was normal in size.  ------------------------------------------------------------------- Pericardium:  There was no pericardial effusion.  ------------------------------------------------------------------- Systemic veins: Inferior vena cava: The vessel was normal in size.  ------------------------------------------------------------------- Lovenia Kim, M.D. 2015-07-05T15:33:03  ------------------------------------------------------------------- Measurements   Left ventricle                   Value        03/21/2013 Reference  LV ID, ED, PLAX chordal  (L)     36.3  mm     37.8       43 - 52  LV ID, ES, PLAX chordal  (L)     22.8  mm     28.2       23 - 38  LV fx shortening, PLAX   (N)     37    %      25         >=29  chordal  LV PW thickness, ED              15.6  mm     13.4       ---------  IVS/LV PW ratio, ED      (N)     1             0.99       <=1.3  LV e&', lateral                   17.5  cm/s   22.7       ---------  LV E/e&', lateral                 4.79         4.71       ---------  LV e&', medial                    19.8  cm/s   26.4       ---------  LV E/e&', medial                  4.24         4.05       ---------  LV e&', average                   18.65 cm/s   ---------- ---------  LV E/e&', average                 4.5          ---------- ---------    Ventricular septum               Value        03/21/2013 Reference  IVS thickness, ED  15.6  mm     13.3       ---------    LVOT                             Value        03/21/2013 Reference  LVOT ID, S                       20    mm     20         ---------  LVOT area                        3.14  cm^2   3.14       ---------    Aortic valve                     Value        03/21/2013 Reference  Aortic valve peak                192   cm/s   249.76     ---------  velocity, S  Aortic peak gradient, S          15    mm Hg  25         ---------    Aorta                            Value        03/21/2013 Reference  Aortic root ID, ED               33    mm     36         ---------    Left atrium                      Value        03/21/2013 Reference  LA ID, A-P, ES                   28    mm     45         ---------  LA ID/bsa, A-P           (N)     1.79  cm/m^2 2.65       <=2.2    Mitral valve                     Value        03/21/2013 Reference  Mitral E-wave peak               83.9  cm/s   107        ---------  velocity  Mitral A-wave peak               107   cm/s   93.5       ---------  velocity  Mitral deceleration time (H)     254   ms     289        150 - 230  Mitral peak gradient, D          3     mm Hg  5          ---------  Mitral E/A ratio, peak  0.8          1.1        ---------    Systemic veins                   Value        03/21/2013 Reference  Estimated CVP                    3     mm Hg  5          ---------     Right ventricle                  Value        03/21/2013 Reference  RV s&', lateral, S                9.9   cm/s   ---------- ---------  PORTABLE CHEST - 1 VIEW   COMPARISON:  12/29/13   FINDINGS: Cardiac shadow is stable. The lungs are well aerated bilaterally. Diffuse interstitial changes are again identified and stable. No acute bony abnormality is seen. Old rib fractures are again noted. Old proximal right humeral fracture is seen as well.   IMPRESSION: Chronic changes without acute abnormality.   CT HEAD WITHOUT CONTRAST   TECHNIQUE: Contiguous axial images were obtained from the base of the skull through the vertex without intravenous contrast.   COMPARISON:  02/08/2014   FINDINGS: No evidence of parenchymal hemorrhage or extra-axial fluid collection. No mass lesion, mass effect, or midline shift.   No CT evidence of acute infarction.   Subcortical white matter and periventricular small vessel ischemic changes. Old left basal ganglia lacunar infarct.   Global cortical atrophy.  No ventriculomegaly.   Near complete opacification of the right maxillary sinus. Partial opacification of the left ethmoid sinus. Mastoid air cells are clear.   No evidence of calvarial fracture.   IMPRESSION: No evidence of acute intracranial abnormality.   Old left basal ganglia lacunar infarct.   Atrophy with small vessel ischemic changes and intracranial atherosclerosis.   ESOPHOGRAM/BARIUM SWALLOW   TECHNIQUE: Single contrast examination was performed using water-soluble contrast.   FLUOROSCOPY TIME:  1 min and 28 seconds   COMPARISON:  None.   FINDINGS: Barium swallows did not demonstrate any obvious aspiration. No upper esophageal webs, strictures or diverticuli. Esophageal dysmotility is noted with tertiary contractions, esophageal spasm and mild stasis. No intrinsic or extrinsic mass lesions are identified. No mucosal abnormalities. No hiatal hernia or GE  reflux.   IMPRESSION: Nonspecific esophageal dysmotility.   No intrinsic or extrinsic mass lesions.   No obvious aspiration.   ASSESSMENT/PLAN:  UTI-continue Levaquin as prescribed. Diabetes mellitus-continue Lantus CAD-stable Metastatic prostate cancer-followed at [facility]. Hypertension-well controlled Hyperlipidemia-continue Zocor Anemia of neoplasm-recheck Thrombocytopenia-recheck  I spoke with patient's wife at bedside on current management.  I have reviewed patient's medical records received at admission/from hospitalization.  CPT CODE: 24401  Birtha Hatler Y Demitria Hay, [place] [phone]

## 2014-04-18 ENCOUNTER — Other Ambulatory Visit: Payer: Self-pay | Admitting: *Deleted

## 2014-04-18 MED ORDER — HYDROCODONE-ACETAMINOPHEN 5-325 MG PO TABS: ORAL_TABLET | ORAL | Status: DC

## 2014-04-18 NOTE — Telephone Encounter (Signed)
[facility] 

## 2014-04-21 ENCOUNTER — Non-Acute Institutional Stay (SKILLED_NURSING_FACILITY): Payer: Medicare Other | Admitting: Adult Health

## 2014-04-21 ENCOUNTER — Encounter: Payer: Self-pay | Admitting: Adult Health

## 2014-04-21 DIAGNOSIS — E119 Type 2 diabetes mellitus without complications: Secondary | ICD-10-CM

## 2014-04-21 NOTE — Progress Notes (Signed)
Patient ID: Guy [name], male   DOB: [birth year], 80 y.o.   MRN: [id number]               PROGRESS NOTE  DATE:  04/21/14  FACILITY: Nursing Home Location: [facility]  LEVEL OF CARE: SNF (31)  Acute Visit  CHIEF COMPLAINT:  Management of Diabetes Mellitus/Hypoglycemia  HISTORY OF PRESENT ILLNESS: This is a 80 year old male who has CBGs in AM 50, 45, 79, 38 and 45. He has been asymptomatic.    PAST MEDICAL HISTORY : Reviewed.  No changes/see problem list  CURRENT MEDICATIONS: Reviewed per MAR/see medication list  REVIEW OF SYSTEMS:  GENERAL: no change in appetite, no fatigue, no weight changes, no fever, chills or weakness RESPIRATORY: no cough, SOB, DOE, wheezing, hemoptysis CARDIAC: no chest pain, edema or palpitations GI: no abdominal pain, diarrhea, constipation, heart burn, nausea or vomiting  PHYSICAL EXAMINATION  GENERAL: no acute distress EYES: conjunctivae normal, sclerae normal, normal eye lids NECK: supple, trachea midline, no neck masses, no thyroid tenderness, no thyromegaly LYMPHATICS: no LAN in the neck, no supraclavicular LAN RESPIRATORY: breathing is even & unlabored, BS CTAB CARDIAC: RRR, no murmur,no extra heart sounds, no edema GI: abdomen soft, normal BS, no masses, no tenderness, no hepatomegaly, no splenomegaly GU:  Foley catheter intact with yellowish urine draining to urine bag EXTREMITIES: able to move all 4 extremities PSYCHIATRIC: the patient is alert & oriented to person, affect & behavior appropriate  LABS/RADIOLOGY: Labs reviewed: Basic Metabolic Panel:  Recent Labs  10/07/13 1233 10/07/13 1630  04/03/14 0615  04/05/14 0407 04/06/14 0403 04/07/14 0420  NA 139  --   < > 144  < > 137 136* 136*  K 3.9  --   < > 3.3*  < > 4.7 5.1 4.4  CL 97  --   < > 107  < > 108 108 105  CO2  --   --   < > 21  < > 18* 18* 21  GLUCOSE 190*  --   < > 184*  < > 59* 114* 90  BUN 25*  --   < > 40*  < > 36* 29* 26*  CREATININE 1.40*  1.24  < > 1.65*  < > 1.23 1.18 1.17  CALCIUM  --   --   < > 9.1  < > 8.3* 8.6 8.8  MG  --  1.7  --  1.6  --   --   --   --   PHOS  --  3.3  --  2.5  --   --   --   --   < > = values in this interval not displayed. Liver Function Tests:  Recent Labs  01/02/14 0358 04/02/14 1943 04/03/14 0615  AST 103* 154* 94*  ALT 64* 53 47  ALKPHOS 525* 280* 161*  BILITOT 0.9 0.9 0.5  PROT 7.0 7.5 6.5  ALBUMIN 2.6* 2.9* 2.3*   CBC:  Recent Labs  12/27/13 2208 12/29/13 1120  04/02/14 1943  04/05/14 0407 04/05/14 0807 04/06/14 0403 04/07/14 0420  WBC 11.8* 9.2  < > 6.6  < > 17.7*  --  11.4* 8.6  NEUTROABS 9.9* 6.9  --  6.4  --   --   --   --   --   HGB 13.9 13.1  < > 13.8  < > 10.8*  --  11.1* 10.5*  HCT 41.1 38.8*  < > 41.2  < > 33.2*  --  32.9* 32.1*  MCV 95.6 95.8  < > 95.8  < > 96.8  --  95.4 97.6  PLT 132* 110*  < > 123*  < > 76* 72* 75* 68*  < > = values in this interval not displayed.  Cardiac Enzymes:  Recent Labs  04/03/14 0005 04/03/14 0615 04/03/14 1208  TROPONINI 0.39* 0.48* 0.50*   CBG:  Recent Labs  04/06/14 2349 04/07/14 0427 04/07/14 0735  GLUCAP 124* 91 116*    EXAM: PORTABLE CHEST - 1 VIEW   COMPARISON:  12/29/13   FINDINGS: Cardiac shadow is stable. The lungs are well aerated bilaterally. Diffuse interstitial changes are again identified and stable. No acute bony abnormality is seen. Old rib fractures are again noted. Old proximal right humeral fracture is seen as well.   IMPRESSION: Chronic changes without acute abnormality EXAM: CT HEAD WITHOUT CONTRAST   TECHNIQUE: Contiguous axial images were obtained from the base of the skull through the vertex without intravenous contrast.   COMPARISON:  02/08/2014   FINDINGS: No evidence of parenchymal hemorrhage or extra-axial fluid collection. No mass lesion, mass effect, or midline shift.   No CT evidence of acute infarction.   Subcortical white matter and periventricular small vessel  ischemic changes. Old left basal ganglia lacunar infarct.   Global cortical atrophy.  No ventriculomegaly.   Near complete opacification of the right maxillary sinus. Partial opacification of the left ethmoid sinus. Mastoid air cells are clear.   No evidence of calvarial fracture.   IMPRESSION: No evidence of acute intracranial abnormality.   Old left basal ganglia lacunar infarct.   Atrophy with small vessel ischemic changes and intracranial atherosclerosis. EXAM: ESOPHOGRAM/BARIUM SWALLOW   TECHNIQUE: Single contrast examination was performed using water-soluble contrast.   FLUOROSCOPY TIME:  1 min and 28 seconds   COMPARISON:  None.   FINDINGS: Barium swallows did not demonstrate any obvious aspiration. No upper esophageal webs, strictures or diverticuli. Esophageal dysmotility is noted with tertiary contractions, esophageal spasm and mild stasis. No intrinsic or extrinsic mass lesions are identified. No mucosal abnormalities. No hiatal hernia or GE reflux.   IMPRESSION: Nonspecific esophageal dysmotility.   No intrinsic or extrinsic mass lesions.   No obvious aspiration.    ASSESSMENT/PLAN:  Diabetes Mellitus, type II - decrease Lantus 24 units SQ Q HS  CPT CODE: 21194  Seth Bake - NP [facility] [phone]

## 2014-04-22 ENCOUNTER — Encounter: Payer: Self-pay | Admitting: Adult Health

## 2014-04-22 ENCOUNTER — Non-Acute Institutional Stay (SKILLED_NURSING_FACILITY): Payer: Medicare Other | Admitting: Adult Health

## 2014-04-22 DIAGNOSIS — B379 Candidiasis, unspecified: Secondary | ICD-10-CM

## 2014-04-22 NOTE — Progress Notes (Signed)
Patient ID: Guy [name], male   DOB: [birth year], 80 y.o.   MRN: [id number]              PROGRESS NOTE  DATE:  04/22/14  FACILITY: Nursing Home Location: [facility]  LEVEL OF CARE: SNF (31)  Acute Visit  CHIEF COMPLAINT:  Management of Urinary yeast infection  HISTORY OF PRESENT ILLNESS: This is a 80 year old male who complained of dysuria even with foley catheter. No hematuria noted. Urine culture shows budding yeast identified.   PAST MEDICAL HISTORY : Reviewed.  No changes/see problem list  CURRENT MEDICATIONS: Reviewed per MAR/see medication list  REVIEW OF SYSTEMS:  GENERAL: no change in appetite, no fatigue, no weight changes, no fever, chills or weakness RESPIRATORY: no cough, SOB, DOE, wheezing, hemoptysis CARDIAC: no chest pain, edema or palpitations GI: no abdominal pain, diarrhea, constipation, heart burn, nausea or vomiting GU: + dysuria   PHYSICAL EXAMINATION  GENERAL: no acute distress NECK: supple, trachea midline, no neck masses, no thyroid tenderness, no thyromegaly LYMPHATICS: no LAN in the neck, no supraclavicular LAN RESPIRATORY: breathing is even & unlabored, BS CTAB CARDIAC: RRR, no murmur,no extra heart sounds, no edema GI: abdomen soft, normal BS, no masses, no tenderness, no hepatomegaly, no splenomegaly GU:  Foley catheter intact with yellowish urine draining to urine bag EXTREMITIES: able to move all 4 extremities PSYCHIATRIC: the patient is alert & oriented to person, affect & behavior appropriate  LABS/RADIOLOGY: Labs reviewed: Basic Metabolic Panel:  Recent Labs  10/07/13 1233 10/07/13 1630  04/03/14 0615  04/05/14 0407 04/06/14 0403 04/07/14 0420  NA 139  --   < > 144  < > 137 136* 136*  K 3.9  --   < > 3.3*  < > 4.7 5.1 4.4  CL 97  --   < > 107  < > 108 108 105  CO2  --   --   < > 21  < > 18* 18* 21  GLUCOSE 190*  --   < > 184*  < > 59* 114* 90  BUN 25*  --   < > 40*  < > 36* 29* 26*  CREATININE 1.40* 1.24   < > 1.65*  < > 1.23 1.18 1.17  CALCIUM  --   --   < > 9.1  < > 8.3* 8.6 8.8  MG  --  1.7  --  1.6  --   --   --   --   PHOS  --  3.3  --  2.5  --   --   --   --   < > = values in this interval not displayed. Liver Function Tests:  Recent Labs  01/02/14 0358 04/02/14 1943 04/03/14 0615  AST 103* 154* 94*  ALT 64* 53 47  ALKPHOS 525* 280* 161*  BILITOT 0.9 0.9 0.5  PROT 7.0 7.5 6.5  ALBUMIN 2.6* 2.9* 2.3*   CBC:  Recent Labs  12/27/13 2208 12/29/13 1120  04/02/14 1943  04/05/14 0407 04/05/14 0807 04/06/14 0403 04/07/14 0420  WBC 11.8* 9.2  < > 6.6  < > 17.7*  --  11.4* 8.6  NEUTROABS 9.9* 6.9  --  6.4  --   --   --   --   --   HGB 13.9 13.1  < > 13.8  < > 10.8*  --  11.1* 10.5*  HCT 41.1 38.8*  < > 41.2  < > 33.2*  --  32.9* 32.1*  MCV 95.6 95.8  < > 95.8  < > 96.8  --  95.4 97.6  PLT 132* 110*  < > 123*  < > 76* 72* 75* 68*  < > = values in this interval not displayed.  Cardiac Enzymes:  Recent Labs  04/03/14 0005 04/03/14 0615 04/03/14 1208  TROPONINI 0.39* 0.48* 0.50*   CBG:  Recent Labs  04/06/14 2349 04/07/14 0427 04/07/14 0735  GLUCAP 124* 91 116*    EXAM: PORTABLE CHEST - 1 VIEW   COMPARISON:  12/29/13   FINDINGS: Cardiac shadow is stable. The lungs are well aerated bilaterally. Diffuse interstitial changes are again identified and stable. No acute bony abnormality is seen. Old rib fractures are again noted. Old proximal right humeral fracture is seen as well.   IMPRESSION: Chronic changes without acute abnormality EXAM: CT HEAD WITHOUT CONTRAST   TECHNIQUE: Contiguous axial images were obtained from the base of the skull through the vertex without intravenous contrast.   COMPARISON:  02/08/2014   FINDINGS: No evidence of parenchymal hemorrhage or extra-axial fluid collection. No mass lesion, mass effect, or midline shift.   No CT evidence of acute infarction.   Subcortical white matter and periventricular small vessel  ischemic changes. Old left basal ganglia lacunar infarct.   Global cortical atrophy.  No ventriculomegaly.   Near complete opacification of the right maxillary sinus. Partial opacification of the left ethmoid sinus. Mastoid air cells are clear.   No evidence of calvarial fracture.   IMPRESSION: No evidence of acute intracranial abnormality.   Old left basal ganglia lacunar infarct.   Atrophy with small vessel ischemic changes and intracranial atherosclerosis. EXAM: ESOPHOGRAM/BARIUM SWALLOW   TECHNIQUE: Single contrast examination was performed using water-soluble contrast.   FLUOROSCOPY TIME:  1 min and 28 seconds   COMPARISON:  None.   FINDINGS: Barium swallows did not demonstrate any obvious aspiration. No upper esophageal webs, strictures or diverticuli. Esophageal dysmotility is noted with tertiary contractions, esophageal spasm and mild stasis. No intrinsic or extrinsic mass lesions are identified. No mucosal abnormalities. No hiatal hernia or GE reflux.   IMPRESSION: Nonspecific esophageal dysmotility.   No intrinsic or extrinsic mass lesions.   No obvious aspiration.    ASSESSMENT/PLAN:  Urinary Candida Infection - start Diflucan 200 mg 1 tab PO Q D X 7 days  CPT CODE: 26203  Monina Vargas - NP [facility] [phone]

## 2014-05-02 ENCOUNTER — Encounter: Payer: Self-pay | Admitting: Adult Health

## 2014-05-20 ENCOUNTER — Non-Acute Institutional Stay (SKILLED_NURSING_FACILITY): Payer: Medicare Other | Admitting: Adult Health

## 2014-05-20 ENCOUNTER — Encounter: Payer: Self-pay | Admitting: Adult Health

## 2014-05-20 DIAGNOSIS — R531 Weakness: Secondary | ICD-10-CM

## 2014-05-20 DIAGNOSIS — E876 Hypokalemia: Secondary | ICD-10-CM

## 2014-05-20 DIAGNOSIS — J309 Allergic rhinitis, unspecified: Secondary | ICD-10-CM

## 2014-05-20 DIAGNOSIS — E785 Hyperlipidemia, unspecified: Secondary | ICD-10-CM

## 2014-05-20 DIAGNOSIS — C61 Malignant neoplasm of prostate: Secondary | ICD-10-CM

## 2014-05-20 DIAGNOSIS — D696 Thrombocytopenia, unspecified: Secondary | ICD-10-CM

## 2014-05-20 DIAGNOSIS — F3289 Other specified depressive episodes: Secondary | ICD-10-CM

## 2014-05-20 DIAGNOSIS — N390 Urinary tract infection, site not specified: Secondary | ICD-10-CM

## 2014-05-20 DIAGNOSIS — F329 Major depressive disorder, single episode, unspecified: Secondary | ICD-10-CM

## 2014-05-20 DIAGNOSIS — I10 Essential (primary) hypertension: Secondary | ICD-10-CM

## 2014-05-20 DIAGNOSIS — R5383 Other fatigue: Secondary | ICD-10-CM

## 2014-05-20 DIAGNOSIS — R5381 Other malaise: Secondary | ICD-10-CM

## 2014-05-20 DIAGNOSIS — E119 Type 2 diabetes mellitus without complications: Secondary | ICD-10-CM

## 2014-05-20 DIAGNOSIS — E440 Moderate protein-calorie malnutrition: Secondary | ICD-10-CM

## 2014-05-20 DIAGNOSIS — C800 Disseminated malignant neoplasm, unspecified: Secondary | ICD-10-CM

## 2014-05-20 DIAGNOSIS — F32A Depression, unspecified: Secondary | ICD-10-CM

## 2014-05-20 DIAGNOSIS — K219 Gastro-esophageal reflux disease without esophagitis: Secondary | ICD-10-CM

## 2014-05-20 NOTE — Progress Notes (Signed)
Patient ID: Guy [name], male   DOB: 30-Jan-1920, 80 y.o.   MRN: [id number]              PROGRESS NOTE  DATE:  05/20/14  FACILITY: Nursing Home Location: [facility]  LEVEL OF CARE: SNF (31)  Acute Visit  CHIEF COMPLAINT:  Discharge Notes  HISTORY OF PRESENT ILLNESS: This is a 80 year old male who is for discharge home with Home health PT, OT and Nursing. He has been admitted to [facility] on 04/07/14 from [facility] with Severe sepsis likely due to UTI and Generalized weakness. Patient was admitted to this facility for short-term rehabilitation after the patient's recent hospitalization.  Patient has completed SNF rehabilitation and therapy has cleared the patient for discharge.   REASSESSMENT OF ONGOING PROBLEM(S):  HTN: Pt 's HTN remains stable.  Denies CP, sob, DOE, pedal edema, headaches, dizziness or visual disturbances.  No complications from the medications currently being used.  Last BP :144/59  ALLERGIC RHINITIS: Allergic rhinitis remains stable.  Patient denies ongoing symptoms such as runny nose sneezing or tearing. No complications reported from the current medication(s) being used.  HYPOKALEMIA: The patient's hypokalemia remains stable. Patient denies muscle cramping or palpitations. No complications reported from current potassium supplementation.   PAST MEDICAL HISTORY : Reviewed.  No changes/see problem list  CURRENT MEDICATIONS: Reviewed per MAR/see medication list  REVIEW OF SYSTEMS:  GENERAL: no change in appetite, no fatigue, no weight changes, no fever, chills or weakness RESPIRATORY: no cough, SOB, DOE, wheezing, hemoptysis CARDIAC: no chest pain, edema or palpitations GI: no abdominal pain, diarrhea, constipation, heart burn, nausea or vomiting  PHYSICAL EXAMINATION  GENERAL: no acute distress NECK: supple, trachea midline, no neck masses, no thyroid tenderness, no thyromegaly LYMPHATICS: no LAN in the neck, no  supraclavicular LAN RESPIRATORY: breathing is even & unlabored, BS CTAB CARDIAC: RRR, no murmur,no extra heart sounds, no edema GI: abdomen soft, normal BS, no masses, no tenderness, no hepatomegaly, no splenomegaly GU:  Foley catheter intact with yellowish urine draining to urine bag EXTREMITIES: able to move all 4 extremities; limited ROM  to right shoulder PSYCHIATRIC: the patient is alert & oriented to person, affect & behavior appropriate  LABS/RADIOLOGY: 05/17/14  Urine culture shows > 100,000 CFU/ml Staphylococcus aureus (MRSA+) 04/29/14  right shoulder x-ray shows prior lateral clavicular and proximal humeral fractures 04/27/14   Total protein total bilirubin 0.4 direct bilirubin 0.1 albumin 2.7 globulin 2.7 ALP 119 AST 25 ALT 12  04/13/14  WBC 8.9 hemoglobin 9.9 hematocrit 30.9 MCV 99.7 platelet 99  X-ray of left ribs (-) for acute fracture. Labs reviewed: Basic Metabolic Panel:  Recent Labs  10/07/13 1233 10/07/13 1630  04/03/14 0615  04/05/14 0407 04/06/14 0403 04/07/14 0420  NA 139  --   < > 144  < > 137 136* 136*  K 3.9  --   < > 3.3*  < > 4.7 5.1 4.4  CL 97  --   < > 107  < > 108 108 105  CO2  --   --   < > 21  < > 18* 18* 21  GLUCOSE 190*  --   < > 184*  < > 59* 114* 90  BUN 25*  --   < > 40*  < > 36* 29* 26*  CREATININE 1.40* 1.24  < > 1.65*  < > 1.23 1.18 1.17  CALCIUM  --   --   < > 9.1  < >  8.3* 8.6 8.8  MG  --  1.7  --  1.6  --   --   --   --   PHOS  --  3.3  --  2.5  --   --   --   --   < > = values in this interval not displayed. Liver Function Tests:  Recent Labs  01/02/14 0358 04/02/14 1943 04/03/14 0615  AST 103* 154* 94*  ALT 64* 53 47  ALKPHOS 525* 280* 161*  BILITOT 0.9 0.9 0.5  PROT 7.0 7.5 6.5  ALBUMIN 2.6* 2.9* 2.3*   CBC:  Recent Labs  12/27/13 2208 12/29/13 1120  04/02/14 1943  04/05/14 0407 04/05/14 0807 04/06/14 0403 04/07/14 0420  WBC 11.8* 9.2  < > 6.6  < > 17.7*  --  11.4* 8.6  NEUTROABS 9.9* 6.9  --  6.4  --   --   --    --   --   HGB 13.9 13.1  < > 13.8  < > 10.8*  --  11.1* 10.5*  HCT 41.1 38.8*  < > 41.2  < > 33.2*  --  32.9* 32.1*  MCV 95.6 95.8  < > 95.8  < > 96.8  --  95.4 97.6  PLT 132* 110*  < > 123*  < > 76* 72* 75* 68*  < > = values in this interval not displayed.  Cardiac Enzymes:  Recent Labs  04/03/14 0005 04/03/14 0615 04/03/14 1208  TROPONINI 0.39* 0.48* 0.50*   CBG:  Recent Labs  04/06/14 2349 04/07/14 0427 04/07/14 0735  GLUCAP 124* 91 116*    EXAM: PORTABLE CHEST - 1 VIEW   COMPARISON:  12/29/13   FINDINGS: Cardiac shadow is stable. The lungs are well aerated bilaterally. Diffuse interstitial changes are again identified and stable. No acute bony abnormality is seen. Old rib fractures are again noted. Old proximal right humeral fracture is seen as well.   IMPRESSION: Chronic changes without acute abnormality EXAM: CT HEAD WITHOUT CONTRAST   TECHNIQUE: Contiguous axial images were obtained from the base of the skull through the vertex without intravenous contrast.   COMPARISON:  02/08/2014   FINDINGS: No evidence of parenchymal hemorrhage or extra-axial fluid collection. No mass lesion, mass effect, or midline shift.   No CT evidence of acute infarction.   Subcortical white matter and periventricular small vessel ischemic changes. Old left basal ganglia lacunar infarct.   Global cortical atrophy.  No ventriculomegaly.   Near complete opacification of the right maxillary sinus. Partial opacification of the left ethmoid sinus. Mastoid air cells are clear.   No evidence of calvarial fracture.   IMPRESSION: No evidence of acute intracranial abnormality.   Old left basal ganglia lacunar infarct.   Atrophy with small vessel ischemic changes and intracranial atherosclerosis. EXAM: ESOPHOGRAM/BARIUM SWALLOW   TECHNIQUE: Single contrast examination was performed using water-soluble contrast.   FLUOROSCOPY TIME:  1 min and 28 seconds   COMPARISON:   None.   FINDINGS: Barium swallows did not demonstrate any obvious aspiration. No upper esophageal webs, strictures or diverticuli. Esophageal dysmotility is noted with tertiary contractions, esophageal spasm and mild stasis. No intrinsic or extrinsic mass lesions are identified. No mucosal abnormalities. No hiatal hernia or GE reflux.   IMPRESSION: Nonspecific esophageal dysmotility.   No intrinsic or extrinsic mass lesions.   No obvious aspiration.    ASSESSMENT/PLAN:  UTI - start Macrobid 100 mg PO BID X 7 days Prostate CA metastatic to multiple sites - follows-up @  [facility]; chronic foley catheter Diabetes Mellitus, type 2 - continue Novolog and Lantus Malnutrition, moderate - continue supplementation Thrombocytopenia - stable Hypokalemia - continue supplementation Generalized weakness - for home health PT, OT and nursing  Hypertension - continue Amlodipine and Toprol XL GERD - stable; continue Omeprazole Depression - continue Zoloft Hyperlipidemia - continue Simvastatin Allergic Rhinitis - stable; continue Flonase   I have filled out patient's discharge paperwork and written prescriptions.  Patient will receive home health PT, OT and Nursing  Total discharge time: Greater than 30 minutes  Discharge time involved coordination of the discharge process with social worker, nursing staff and therapy department. Medical justification for home health services verified.   CPT CODE: 49355   Seth Bake - NP [facility] [phone]

## 2014-05-20 NOTE — Progress Notes (Signed)
This encounter was created in error - please disregard.

## 2014-05-22 DIAGNOSIS — N390 Urinary tract infection, site not specified: Secondary | ICD-10-CM

## 2014-05-22 DIAGNOSIS — C7919 Secondary malignant neoplasm of other urinary organs: Secondary | ICD-10-CM

## 2014-05-22 DIAGNOSIS — M6281 Muscle weakness (generalized): Secondary | ICD-10-CM

## 2014-05-22 DIAGNOSIS — C61 Malignant neoplasm of prostate: Secondary | ICD-10-CM

## 2014-07-28 ENCOUNTER — Other Ambulatory Visit: Payer: Self-pay | Admitting: Orthopedic Surgery

## 2014-07-28 DIAGNOSIS — S40011D Contusion of right shoulder, subsequent encounter: Secondary | ICD-10-CM

## 2014-07-29 ENCOUNTER — Ambulatory Visit
Admission: RE | Admit: 2014-07-29 | Discharge: 2014-07-29 | Disposition: A | Discharge status: Home / Self Care | Payer: Medicare Other | Source: Ambulatory Visit | Attending: Orthopedic Surgery | Admitting: Orthopedic Surgery

## 2014-07-29 DIAGNOSIS — S40011D Contusion of right shoulder, subsequent encounter: Secondary | ICD-10-CM

## 2014-08-04 ENCOUNTER — Telehealth: Payer: Self-pay | Admitting: Cardiovascular Disease

## 2014-08-04 NOTE — Telephone Encounter (Signed)
Records from [facility] received for appointment on 11.11.15 with Adora Fridge: given to [facility] in HIM department:djc

## 2014-08-10 ENCOUNTER — Ambulatory Visit: Payer: Medicare Other | Admitting: Cardiovascular Disease

## 2014-09-12 ENCOUNTER — Ambulatory Visit: Payer: Medicare Other | Attending: Orthopedic Surgery | Admitting: Physical Therapy

## 2014-09-12 DIAGNOSIS — R262 Difficulty in walking, not elsewhere classified: Secondary | ICD-10-CM | POA: Diagnosis not present

## 2014-09-12 DIAGNOSIS — R5381 Other malaise: Secondary | ICD-10-CM | POA: Insufficient documentation

## 2014-09-12 DIAGNOSIS — M25511 Pain in right shoulder: Secondary | ICD-10-CM | POA: Insufficient documentation

## 2014-09-15 ENCOUNTER — Ambulatory Visit: Payer: Medicare Other | Admitting: Physical Therapy

## 2014-09-16 ENCOUNTER — Ambulatory Visit: Payer: Medicare Other | Admitting: Physical Therapy

## 2014-09-19 ENCOUNTER — Ambulatory Visit: Payer: Medicare Other | Admitting: Physical Therapy

## 2014-09-19 DIAGNOSIS — R262 Difficulty in walking, not elsewhere classified: Secondary | ICD-10-CM | POA: Diagnosis not present

## 2014-09-21 ENCOUNTER — Ambulatory Visit: Payer: Medicare Other | Admitting: Physical Therapy

## 2014-09-26 ENCOUNTER — Ambulatory Visit: Payer: Medicare Other | Admitting: Physical Therapy

## 2014-09-26 DIAGNOSIS — R262 Difficulty in walking, not elsewhere classified: Secondary | ICD-10-CM | POA: Diagnosis not present

## 2014-09-28 ENCOUNTER — Ambulatory Visit: Payer: Medicare Other | Admitting: Physical Therapy

## 2014-09-28 DIAGNOSIS — R262 Difficulty in walking, not elsewhere classified: Secondary | ICD-10-CM | POA: Diagnosis not present

## 2014-10-03 ENCOUNTER — Ambulatory Visit: Payer: Medicare Other | Attending: Orthopedic Surgery | Admitting: Physical Therapy

## 2014-10-03 DIAGNOSIS — R262 Difficulty in walking, not elsewhere classified: Secondary | ICD-10-CM | POA: Insufficient documentation

## 2014-10-03 DIAGNOSIS — M25511 Pain in right shoulder: Secondary | ICD-10-CM | POA: Diagnosis not present

## 2014-10-03 DIAGNOSIS — R5381 Other malaise: Secondary | ICD-10-CM | POA: Diagnosis not present

## 2014-10-07 ENCOUNTER — Ambulatory Visit: Payer: Medicare Other | Admitting: Physical Therapy

## 2014-10-07 DIAGNOSIS — R262 Difficulty in walking, not elsewhere classified: Secondary | ICD-10-CM | POA: Diagnosis not present

## 2014-10-11 ENCOUNTER — Ambulatory Visit: Payer: Medicare Other | Admitting: Physical Therapy

## 2014-10-11 DIAGNOSIS — R262 Difficulty in walking, not elsewhere classified: Secondary | ICD-10-CM | POA: Diagnosis not present

## 2014-10-13 ENCOUNTER — Ambulatory Visit: Payer: Medicare Other | Admitting: Physical Therapy

## 2014-10-13 DIAGNOSIS — R262 Difficulty in walking, not elsewhere classified: Secondary | ICD-10-CM | POA: Diagnosis not present

## 2014-10-14 ENCOUNTER — Encounter: Payer: Self-pay | Admitting: Cardiovascular Disease

## 2014-10-14 ENCOUNTER — Ambulatory Visit (INDEPENDENT_AMBULATORY_CARE_PROVIDER_SITE_OTHER): Payer: Medicare Other | Admitting: Cardiovascular Disease

## 2014-10-14 VITALS — BP 154/82 | HR 59 | Ht 66.0 in | Wt 126.0 lb

## 2014-10-14 DIAGNOSIS — E785 Hyperlipidemia, unspecified: Secondary | ICD-10-CM

## 2014-10-14 DIAGNOSIS — I10 Essential (primary) hypertension: Secondary | ICD-10-CM

## 2014-10-14 DIAGNOSIS — I2510 Atherosclerotic heart disease of native coronary artery without angina pectoris: Secondary | ICD-10-CM

## 2014-10-14 NOTE — Assessment & Plan Note (Signed)
History of hypertension with blood pressure measured at 154/82. He is on metoprolol and amlodipine. Continue current meds at current dosing

## 2014-10-14 NOTE — Patient Instructions (Signed)
Your physician wants you to follow-up in: 6 months with Dr Berry. You will receive a reminder letter in the mail two months in advance. If you don't receive a letter, please call our office to schedule the follow-up appointment.  

## 2014-10-14 NOTE — Assessment & Plan Note (Signed)
History of hyperlipidemia on simvastatin 20 mg a day followed by his PCP

## 2014-10-14 NOTE — Assessment & Plan Note (Signed)
History of CAD status post remote LAD stenting in 1996 in [place]. I catheterized him 11/27/01 revealing a patent LAD stent with mild in-stent restenosis. He had a Myoview performed March 2008 which was nonischemic. In addition, she had a more recent Myoview performed 03/02/14 which was nonischemic as well with an EF of 43%. He does get occasional chest pain.

## 2014-10-14 NOTE — Progress Notes (Signed)
10/14/2014 Tonette Lederer   [birth year]  [id number]  Primary Physician [name], MD Primary Cardiologist: Lorretta Harp MD [name]   HPI:  The patient is a very pleasant 81 year old thin and frail appearing married Caucasian male, father of 10, grandfather of 4 great grandchildren who is accompanied by his wife today who is also a patient of mine. I last saw him 03/09/14. He has a history of CAD status post remote LAD stenting in 1996 in [place]. I catheterized him November 27, 2001 revealing a patent LAD stent with mild in-stent restenosis. His last Myoview performed in March of 2008 was nonischemic, as was one on June 12, 2009. His other problems include hypertension, hyperlipidemia, and non-insulin-requiring diabetes. He does have metastatic prostate cancer involving his lungs and ribs and has had multiple operations for these. He gets occasionally chest pain which is nitrate responsive. Since I saw him last he had hospitalizations for fractured vertebrae requiring kyphoplasty as well as urinary tract infection and surgery on his urethra.  I last saw him in September 2014. He has sustained multiple falls and has metastatic prostate cancer. Otherwise, there is no change in his clinical condition. He does have some dental issues in this has affected his ability to eat and contribute to his weight loss. He gets occasional chest pain.   Current Outpatient Prescriptions  Medication Sig Dispense Refill  . amLODipine (NORVASC) 10 MG tablet Take 5 mg by mouth daily. Take 1/2 tab=5 mg    . aspirin 81 MG tablet Take 81 mg by mouth daily. For heart theraphy    . bicalutamide (CASODEX) 50 MG tablet Take 50 mg by mouth daily.    . Calcium Carbonate-Vitamin D (CALCIUM 600+D HIGH POTENCY) 600-400 MG-UNIT per tablet Take 1 tablet by mouth 2 (two) times daily.      . camphor-menthol (SARNA) lotion Apply 1 application topically daily as needed for itching.    [name] CRANBERRY  CONCENTRATE PO Take 4 capsules by mouth daily.    . fluticasone (FLONASE) 50 MCG/ACT nasal spray Place 2 sprays into the nose 2 (two) times daily.    . hydrOXYzine (ATARAX/VISTARIL) 25 MG tablet Take 1 tablet (25 mg total) by mouth 3 (three) times daily as needed for anxiety or itching. 10 tablet 0  . insulin aspart (NOVOLOG) 100 UNIT/ML injection Inject 0-6 Units into the skin 3 (three) times daily with meals. 0-150=0; 151-200=2u; 201-250=3u; 251-300=4u; 301-350=5u; over 350 =6u prior to meals. With breakfast and lunch.    . insulin glargine (LANTUS) 100 UNIT/ML injection Inject 28 Units into the skin at bedtime.     . lipase/protease/amylase (CREON-12/PANCREASE) 12000 UNITS CPEP Take 2 capsules by mouth 3 (three) times daily. For AKI    . Melatonin 5 MG TABS Take 5 mg by mouth at bedtime. For insomnia    . metoprolol succinate (TOPROL-XL) 25 MG 24 hr tablet Take 12.5 mg by mouth daily. For HTN    . Multiple Vitamins-Minerals (MULTIVITAMINS THER. W/MINERALS) TABS Take 1 tablet by mouth every morning.     . nitroGLYCERIN (NITROSTAT) 0.4 MG SL tablet Place 0.4 mg under the tongue.    [name] omeprazole (PRILOSEC) 20 MG capsule Take 20 mg by mouth 2 (two) times daily before a meal. For GERD    . oxybutynin (DITROPAN) 5 MG tablet Take 5 mg by mouth daily. For bladder urgency    . sertraline (ZOLOFT) 100 MG tablet Take 100 mg by mouth 2 (two) times daily.  For depression    . simvastatin (ZOCOR) 20 MG tablet Take 10 mg by mouth every other day. Take 1/2 tablet =10 mg for HLD.     No current facility-administered medications for this visit.    Allergies  Allergen Reactions  . Albuterol Sulfate Hfa [Kdc:Albuterol] Shortness Of Breath and Swelling  . Adhesive [Tape] Other (See Comments)    REACTION: SKIN BLISTERS  . Albuterol Swelling    Per pt., side of his neck began to swell with nebulized Albuterol in doctor's office.  . Contrast Media [Iodinated Diagnostic Agents] Other (See Comments)    Reaction  unknown  . Lactose     Other reaction(s): GI Upset (intolerance)  . Metrizamide Other (See Comments)    Reaction unknown  . Penicillins Other (See Comments) and Itching    REACTION: ITCHING HANDS  . Sulfa Drugs Cross Reactors Hives  . Budesonide-Formoterol Fumarate Rash  . Sulfamethoxazole Rash    History   Social History  . Marital Status: Married    Spouse Name: [name]    Number of Children: 3  . Years of Education: N/A   Occupational History  . Business owner, Press photographer, retired    Social History Main Topics  . Smoking status: Former Research scientist (life sciences)  . Smokeless tobacco: Never Used  . Alcohol Use: No  . Drug Use: No  . Sexual Activity: No   Other Topics Concern  . Not on file   Social History Narrative   Married.  Lives with his wife.  Ambulates with a walker and a cane.     Review of Systems: General: negative for chills, fever, night sweats or weight changes.  Cardiovascular: negative for chest pain, dyspnea on exertion, edema, orthopnea, palpitations, paroxysmal nocturnal dyspnea or shortness of breath Dermatological: negative for rash Respiratory: negative for cough or wheezing Urologic: negative for hematuria Abdominal: negative for nausea, vomiting, diarrhea, bright red blood per rectum, melena, or hematemesis Neurologic: negative for visual changes, syncope, or dizziness All other systems reviewed and are otherwise negative except as noted above.    Blood pressure 154/82, pulse 59, height 5\' 6"  (1.676 m), weight 126 lb (57.153 kg).  General appearance: alert and no distress Neck: no adenopathy, no carotid bruit, no JVD, supple, symmetrical, trachea midline and thyroid not enlarged, symmetric, no tenderness/mass/nodules Lungs: clear to auscultation bilaterally Heart: regular rate and rhythm, S1, S2 normal, no murmur, click, rub or gallop Extremities: extremities normal, atraumatic, no cyanosis or edema  EKG sinus bradycardia 59 with right bundle branch block. I  personally reviewed this EKG  ASSESSMENT AND PLAN:   Hyperlipidemia History of hyperlipidemia on simvastatin 20 mg a day followed by his PCP   Essential hypertension, benign History of hypertension with blood pressure measured at 154/82. He is on metoprolol and amlodipine. Continue current meds at current dosing   Coronary artery disease involving native coronary artery of native heart without angina pectoris History of CAD status post remote LAD stenting in 1996 in [place]. I catheterized him 11/27/01 revealing a patent LAD stent with mild in-stent restenosis. He had a Myoview performed March 2008 which was nonischemic. In addition, she had a more recent Myoview performed 03/02/14 which was nonischemic as well with an EF of 43%. He does get occasional chest pain.       Lorretta Harp MD FACP,FACC,FAHA, [facility] 10/14/2014 2:22 PM

## 2014-10-18 ENCOUNTER — Ambulatory Visit: Payer: Medicare Other | Admitting: Physical Therapy

## 2014-10-18 DIAGNOSIS — R262 Difficulty in walking, not elsewhere classified: Secondary | ICD-10-CM | POA: Diagnosis not present

## 2014-10-20 ENCOUNTER — Ambulatory Visit: Payer: Medicare Other | Admitting: Physical Therapy

## 2014-10-20 DIAGNOSIS — R262 Difficulty in walking, not elsewhere classified: Secondary | ICD-10-CM | POA: Diagnosis not present

## 2014-10-26 ENCOUNTER — Ambulatory Visit: Payer: Medicare Other | Admitting: Physical Therapy

## 2014-10-26 DIAGNOSIS — R262 Difficulty in walking, not elsewhere classified: Secondary | ICD-10-CM | POA: Diagnosis not present

## 2014-10-27 ENCOUNTER — Ambulatory Visit: Payer: Medicare Other | Admitting: Physical Therapy

## 2014-10-31 ENCOUNTER — Ambulatory Visit: Payer: Medicare Other | Admitting: Physical Therapy

## 2014-10-31 DIAGNOSIS — R5381 Other malaise: Secondary | ICD-10-CM | POA: Insufficient documentation

## 2014-10-31 DIAGNOSIS — R262 Difficulty in walking, not elsewhere classified: Secondary | ICD-10-CM | POA: Insufficient documentation

## 2014-10-31 DIAGNOSIS — M25511 Pain in right shoulder: Secondary | ICD-10-CM | POA: Insufficient documentation

## 2014-11-03 ENCOUNTER — Ambulatory Visit: Payer: Medicare Other | Admitting: Physical Therapy

## 2014-12-18 ENCOUNTER — Ambulatory Visit (INDEPENDENT_AMBULATORY_CARE_PROVIDER_SITE_OTHER): Payer: Medicare Other | Admitting: Emergency Medicine

## 2014-12-18 VITALS — BP 110/60 | HR 64 | Temp 97.4°F | Resp 20 | Ht 64.0 in | Wt 121.4 lb

## 2014-12-18 DIAGNOSIS — I2510 Atherosclerotic heart disease of native coronary artery without angina pectoris: Secondary | ICD-10-CM | POA: Diagnosis not present

## 2014-12-18 DIAGNOSIS — T148XXA Other injury of unspecified body region, initial encounter: Secondary | ICD-10-CM

## 2014-12-18 MED ORDER — MUPIROCIN 2 % EX OINT: 1.0000 "application " | TOPICAL_OINTMENT | Freq: Every day | CUTANEOUS | Status: DC

## 2014-12-18 NOTE — Progress Notes (Signed)
[facility] [address], [place] [address] [phone]  Date:  12/18/2014   Name:  Guy [name]   DOB:  09/04/1920   MRN:  [id number]  PCP:  [name], MD    Chief Complaint: Arm Pain   History of Present Illness:  MD SMOLA is a 81 y.o. very pleasant male patient who presents with the following:  Comes to the office with a skin avulsion after having a band aid pulled off  Has an area on right antecubital area of skin loss No other complaint of pain or other acute concerns Denies other complaint or health concern today.   Patient Active Problem List   Diagnosis Date Noted  . Yeast infection 04/22/2014  . Type 2 diabetes mellitus without complication 78/67/6720  . Coronary artery disease involving native coronary artery of native heart without angina pectoris 04/13/2014  . Hypokalemia 04/08/2014  . Generalized weakness 04/08/2014  . GERD (gastroesophageal reflux disease) 04/08/2014  . Depression 04/08/2014  . Allergic rhinitis 04/08/2014  . RBBB 04/04/2014  . Malnutrition of moderate degree 04/04/2014  . Hypoglycemia 04/04/2014  . Acute kidney injury 04/04/2014  . Thrombocytopenia, unspecified 04/03/2014  . DIC (disseminated intravascular coagulation) 04/03/2014  . Elevated troponin 04/03/2014  . Prolonged Q-T interval on ECG 04/03/2014  . Ejection fraction   . Sepsis 04/02/2014  . UTI (lower urinary tract infection) 04/02/2014  . Chest pain 01/28/2014  . Pain 12/31/2013  . Lumbar transverse process fracture 12/29/2013  . Back pain 12/29/2013  . Fall 12/29/2013  . Thoracic compression fracture 12/29/2013  . Anemia of other chronic disease 04/29/2013  . Urinary incontinence 04/25/2013  . CAD (coronary artery disease) 02/18/2013  . Acute venous embolism and thrombosis of deep vessels of proximal lower extremity 02/18/2013  . Hyperlipidemia   . Essential hypertension, benign 12/28/2012  . Prostate cancer metastatic to  multiple sites 11/16/2012  . DM (diabetes mellitus) 11/16/2012  . Dehydration 11/16/2012  . Leukocytosis, unspecified 11/16/2012    Past Medical History  Diagnosis Date  . Hypertension   . Diabetes mellitus without complication   . Arthritis   . Sciatica   . GERD (gastroesophageal reflux disease)   . Urge incontinence   . Vertigo   . Hyperlipidemia   . ED (erectile dysfunction)   . Gastric polyposis   . Osteoporosis   . Decreased libido   . Depression   . Hepatitis A   . Stroke   . History of blood transfusion     1946  . OA (osteoarthritis)   . Bladder cancer   . Prostate cancer     S/P prostatectomy; Lung metastasis  . CAD (coronary artery disease) 1996    stent to the LAD   . Hyperlipidemia   . Type 2 diabetes mellitus   . Ejection fraction   . COPD (chronic obstructive pulmonary disease)     Past Surgical History  Procedure Laterality Date  . Carpal tunnel release    . Cataract extraction w/ intraocular lens  implant, bilateral  1998  . Prostatectomy    . Urinary sphincter implant    . Urinary sphincter implant revision    . Appendectomy    . Left hip surgery      Donated bone for bone graft to arm  . Penile prosthesis implant      S/P removal and re-implantation of new prosthesis  . Middle ear surgery    . Finger surgery  Left and right  . Hernia repair    . Bladder surgery    . Right arm bone graft      Pathological fracture  . Cardiac catheterization  11/27/2001    patent stent with mild in-stent restenosis.  . Myoview  06/12/2009    Persantine myoview EF 76%; Normal myoview  . Coronary angioplasty  1996    stenting to the LAD in [place].     History  Substance Use Topics  . Smoking status: Former Research scientist (life sciences)  . Smokeless tobacco: Never Used  . Alcohol Use: No    Family History  Problem Relation Age of Onset  . Heart failure Mother   . Heart disease Mother   . Bladder Cancer Father   . Pancreatic cancer Sister   . Lung cancer Sister    . Breast cancer Daughter   . Liver disease Son     Allergies  Allergen Reactions  . Albuterol Sulfate Hfa [Kdc:Albuterol] Shortness Of Breath and Swelling  . Adhesive [Tape] Other (See Comments)    REACTION: SKIN BLISTERS  . Albuterol Swelling    Per pt., side of his neck began to swell with nebulized Albuterol in doctor's office.  . Contrast Media [Iodinated Diagnostic Agents] Other (See Comments)    Reaction unknown  . Lactose     Other reaction(s): GI Upset (intolerance)  . Metrizamide Other (See Comments)    Reaction unknown  . Penicillins Other (See Comments) and Itching    REACTION: ITCHING HANDS  . Sulfa Drugs Cross Reactors Hives  . Budesonide-Formoterol Fumarate Rash  . Sulfamethoxazole Rash    Medication list has been reviewed and updated.  Current Outpatient Prescriptions on File Prior to Visit  Medication Sig Dispense Refill  . amLODipine (NORVASC) 10 MG tablet Take 5 mg by mouth daily. Take 1/2 tab=5 mg    . aspirin 81 MG tablet Take 81 mg by mouth daily. For heart theraphy    . bicalutamide (CASODEX) 50 MG tablet Take 50 mg by mouth daily.    . Calcium Carbonate-Vitamin D (CALCIUM 600+D HIGH POTENCY) 600-400 MG-UNIT per tablet Take 1 tablet by mouth 2 (two) times daily.      . camphor-menthol (SARNA) lotion Apply 1 application topically daily as needed for itching.    [name] CRANBERRY CONCENTRATE PO Take 4 capsules by mouth daily.    . fluticasone (FLONASE) 50 MCG/ACT nasal spray Place 2 sprays into the nose 2 (two) times daily.    . hydrOXYzine (ATARAX/VISTARIL) 25 MG tablet Take 1 tablet (25 mg total) by mouth 3 (three) times daily as needed for anxiety or itching. 10 tablet 0  . insulin aspart (NOVOLOG) 100 UNIT/ML injection Inject 0-6 Units into the skin 3 (three) times daily with meals. 0-150=0; 151-200=2u; 201-250=3u; 251-300=4u; 301-350=5u; over 350 =6u prior to meals. With breakfast and lunch.    . insulin glargine (LANTUS) 100 UNIT/ML injection Inject 28 Units  into the skin at bedtime.     . lipase/protease/amylase (CREON-12/PANCREASE) 12000 UNITS CPEP Take 2 capsules by mouth 3 (three) times daily. For AKI    . Melatonin 5 MG TABS Take 5 mg by mouth at bedtime. For insomnia    . metoprolol succinate (TOPROL-XL) 25 MG 24 hr tablet Take 12.5 mg by mouth daily. For HTN    . Multiple Vitamins-Minerals (MULTIVITAMINS THER. W/MINERALS) TABS Take 1 tablet by mouth every morning.     . nitroGLYCERIN (NITROSTAT) 0.4 MG SL tablet Place 0.4 mg under the tongue.    [name]  omeprazole (PRILOSEC) 20 MG capsule Take 20 mg by mouth 2 (two) times daily before a meal. For GERD    . oxybutynin (DITROPAN) 5 MG tablet Take 5 mg by mouth daily. For bladder urgency    . sertraline (ZOLOFT) 100 MG tablet Take 100 mg by mouth 2 (two) times daily. For depression    . simvastatin (ZOCOR) 20 MG tablet Take 10 mg by mouth every other day. Take 1/2 tablet =10 mg for HLD.     No current facility-administered medications on file prior to visit.    Review of Systems:  As per HPI, otherwise negative.    Physical Examination: Filed Vitals:   12/18/14 1154  BP: 110/60  Pulse: 64  Temp: 97.4 F (36.3 C)  Resp: 20   Filed Vitals:   12/18/14 1154  Height: 5\' 4"  (1.626 m)  Weight: 121 lb 6 oz (55.055 kg)   Body mass index is 20.82 kg/(m^2). Ideal Body Weight: Weight in (lb) to have BMI = 25: 145.3   GEN: WDWN, NAD, Non-toxic, Alert & Oriented x 3 HEENT: Atraumatic, Normocephalic.  Ears and Nose: No external deformity. EXTR: No clubbing/cyanosis/edema NEURO: Normal gait.  PSYCH: Normally interactive. Conversant. Not depressed or anxious appearing.  Calm demeanor.  Right arm:  Skin avulsion with no cellulitis  Assessment and Plan: Skin avulsion Silvadene Daily dressing change Follow up in one week  Signed,  Ellison Carwin, MD

## 2014-12-18 NOTE — Patient Instructions (Signed)

## 2015-02-23 ENCOUNTER — Telehealth: Payer: Self-pay | Admitting: Cardiovascular Disease

## 2015-02-24 NOTE — Telephone Encounter (Signed)
Closed encounter °

## 2015-03-08 ENCOUNTER — Ambulatory Visit: Payer: Medicare Other

## 2015-03-14 ENCOUNTER — Encounter: Payer: Self-pay | Admitting: Cardiovascular Disease

## 2015-03-14 ENCOUNTER — Ambulatory Visit (INDEPENDENT_AMBULATORY_CARE_PROVIDER_SITE_OTHER): Payer: Medicare Other | Admitting: Cardiovascular Disease

## 2015-03-14 VITALS — BP 154/70 | HR 59 | Ht 66.0 in | Wt 199.7 lb

## 2015-03-14 DIAGNOSIS — I2510 Atherosclerotic heart disease of native coronary artery without angina pectoris: Secondary | ICD-10-CM

## 2015-03-14 DIAGNOSIS — I10 Essential (primary) hypertension: Secondary | ICD-10-CM

## 2015-03-14 DIAGNOSIS — L97429 Non-pressure chronic ulcer of left heel and midfoot with unspecified severity: Secondary | ICD-10-CM

## 2015-03-14 DIAGNOSIS — I2583 Coronary atherosclerosis due to lipid rich plaque: Secondary | ICD-10-CM

## 2015-03-14 DIAGNOSIS — E785 Hyperlipidemia, unspecified: Secondary | ICD-10-CM

## 2015-03-14 NOTE — Assessment & Plan Note (Signed)
History of coronary artery disease status post remote LAD stenting in 1996 in [place]. I catheterized him 11/27/01 revealing a patent LAD stent with mild in-stent restenosis. His last Myoview performed marginal Way was nonischemic. He denies chest pain or increasing shortness of breath.

## 2015-03-14 NOTE — Assessment & Plan Note (Signed)
History of hypertension with blood pressure measured at 154/70. He is on metoprolol and amlodipine. Continue current meds at current dosing

## 2015-03-14 NOTE — Patient Instructions (Signed)
Your physician wants you to follow-up in: 6 months with Dr Berry. You will receive a reminder letter in the mail two months in advance. If you don't receive a letter, please call our office to schedule the follow-up appointment.  

## 2015-03-14 NOTE — Assessment & Plan Note (Signed)
History of hyperlipidemia. His last lipid profile performed 2 years ago showed an LDL 37. He is on simvastatin 20 mg followed by his PCP

## 2015-03-14 NOTE — Assessment & Plan Note (Signed)
Guy [name] has a small painful left heel ulcer which has developed over the last month. His foot is warm however cannot feel pedal pulses. Dopplers performed in 2006 were normal.at this point, I do not recommend proceeding with vascular ultrasound to determine circulation since I do not think he is a candidate for endovascular therapy. Recommend aggressive local care by his podiatrist

## 2015-03-14 NOTE — Progress Notes (Signed)
03/14/2015 Guy [name]   [birth year]  [id number]  Primary Physician [name], MD Primary Cardiologist: Lorretta Harp MD [name]   HPI:  The patient is a very pleasant 81 year old thin and frail appearing married Caucasian male, father of 79, grandfather of 4 great grandchildren who is accompanied by his wife today who is also a patient of mine. I last saw him 10/14/14.Guy [name] He has a history of CAD status post remote LAD stenting in 1996 in [place]. I catheterized him November 27, 2001 revealing a patent LAD stent with mild in-stent restenosis. His last Myoview performed in March of 2008 was nonischemic, as was one on June 12, 2009. His other problems include hypertension, hyperlipidemia, and non-insulin-requiring diabetes. He does have metastatic prostate cancer involving his lungs and ribs and has had multiple operations for these. He gets occasionally chest pain which is nitrate responsive. Since I saw him last he had hospitalizations for fractured vertebrae requiring kyphoplasty as well as urinary tract infection and surgery on his urethra.  I last saw him in September 2014. He has sustained multiple falls and has metastatic prostate cancer. Otherwise, there is no change in his clinical condition other than a new left heel ulcer which has developed over the last month and is causing difficulty with ambulation.  Current Outpatient Prescriptions  Medication Sig Dispense Refill  . amLODipine (NORVASC) 10 MG tablet Take 5 mg by mouth daily. Take 1/2 tab=5 mg    . aspirin 81 MG tablet Take 81 mg by mouth daily. For heart theraphy    . bicalutamide (CASODEX) 50 MG tablet Take 50 mg by mouth daily.    . Calcium Carbonate-Vitamin D (CALCIUM 600+D HIGH POTENCY) 600-400 MG-UNIT per tablet Take 1 tablet by mouth 2 (two) times daily.      . camphor-menthol (SARNA) lotion Apply 1 application topically daily as needed for itching.    Guy [name] CRANBERRY CONCENTRATE PO Take 4  capsules by mouth daily.    . cycloSPORINE (RESTASIS) 0.05 % ophthalmic emulsion Place 1 drop into both eyes 2 (two) times daily.    . fluticasone (FLONASE) 50 MCG/ACT nasal spray Place 2 sprays into the nose 2 (two) times daily.    Guy [name] gabapentin (NEURONTIN) 100 MG capsule Take 1 capsule by mouth at bedtime.    . hydrOXYzine (ATARAX/VISTARIL) 25 MG tablet Take 1 tablet (25 mg total) by mouth 3 (three) times daily as needed for anxiety or itching. 10 tablet 0  . insulin aspart (NOVOLOG) 100 UNIT/ML injection Inject 0-6 Units into the skin 3 (three) times daily with meals. 0-150=0; 151-200=2u; 201-250=3u; 251-300=4u; 301-350=5u; over 350 =6u prior to meals. With breakfast and lunch.    . insulin glargine (LANTUS) 100 UNIT/ML injection Inject 28 Units into the skin at bedtime.     . lipase/protease/amylase (CREON-12/PANCREASE) 12000 UNITS CPEP Take 2 capsules by mouth 3 (three) times daily. For AKI    . Melatonin 5 MG TABS Take 5 mg by mouth at bedtime. For insomnia    . metoprolol succinate (TOPROL-XL) 25 MG 24 hr tablet Take 12.5 mg by mouth daily. For HTN    . Multiple Vitamins-Minerals (MULTIVITAMINS THER. W/MINERALS) TABS Take 1 tablet by mouth every morning.     . mupirocin ointment (BACTROBAN) 2 % Apply 1 application topically daily. 30 g 1  . nitroGLYCERIN (NITROSTAT) 0.4 MG SL tablet Place 0.4 mg under the tongue.    Guy [name] omeprazole (PRILOSEC) 20 MG capsule Take 20 mg by mouth  2 (two) times daily before a meal. For GERD    . oxybutynin (DITROPAN) 5 MG tablet Take 5 mg by mouth daily. For bladder urgency    . sertraline (ZOLOFT) 100 MG tablet Take 100 mg by mouth 2 (two) times daily. For depression    . silver sulfADIAZINE (SILVADENE) 1 % cream Apply 1 application topically 2 (two) times daily.    . simvastatin (ZOCOR) 20 MG tablet Take 10 mg by mouth every other day. Take 1/2 tablet =10 mg for HLD.    Guy [name] triamcinolone ointment (KENALOG) 0.1 % Apply 1 application topically 2 (two) times daily.      No current facility-administered medications for this visit.    Allergies  Allergen Reactions  . Albuterol Sulfate Hfa [Kdc:Albuterol] Shortness Of Breath and Swelling  . Adhesive [Tape] Other (See Comments)    REACTION: SKIN BLISTERS  . Albuterol Swelling    Per pt., side of his neck began to swell with nebulized Albuterol in doctor's office.  . Contrast Media [Iodinated Diagnostic Agents] Other (See Comments)    unknown Reaction unknown  . Ioxaglate Other (See Comments)    Reaction unknown  . Lactose     Other reaction(s): GI Upset (intolerance)  . Metrizamide Other (See Comments)    Reaction unknown  . Penicillins Other (See Comments) and Itching    REACTION: ITCHING HANDS  . Sulfa Drugs Cross Reactors Hives  . Budesonide-Formoterol Fumarate Rash  . Sulfamethoxazole Rash    History   Social History  . Marital Status: Married    Spouse Name: Guy [name]  . Number of Children: 3  . Years of Education: N/A   Occupational History  . Business owner, Press photographer, retired    Social History Main Topics  . Smoking status: Former Research scientist (life sciences)  . Smokeless tobacco: Never Used  . Alcohol Use: No  . Drug Use: No  . Sexual Activity: No   Other Topics Concern  . Not on file   Social History Narrative   Married.  Lives with his wife.  Ambulates with a walker and a cane.     Review of Systems: General: negative for chills, fever, night sweats or weight changes.  Cardiovascular: negative for chest pain, dyspnea on exertion, edema, orthopnea, palpitations, paroxysmal nocturnal dyspnea or shortness of breath Dermatological: negative for rash Respiratory: negative for cough or wheezing Urologic: negative for hematuria Abdominal: negative for nausea, vomiting, diarrhea, bright red blood per rectum, melena, or hematemesis Neurologic: negative for visual changes, syncope, or dizziness All other systems reviewed and are otherwise negative except as noted above.    Blood pressure 154/70,  pulse 59, height '5\' 6"'$  (1.676 m), weight 199 lb 11.2 oz (90.583 kg).  General appearance: alert and no distress Neck: no adenopathy, no carotid bruit, no JVD, supple, symmetrical, trachea midline and thyroid not enlarged, symmetric, no tenderness/mass/nodules Lungs: clear to auscultation bilaterally Heart: regular rate and rhythm, S1, S2 normal, no murmur, click, rub or gallop Extremities: extremities normal, atraumatic, no cyanosis or edema and small left heel ulcer  EKG sinus bradycardia 59 with right bundle-branch block and left anterior fascicular block. I personally reviewed this EKG  ASSESSMENT AND PLAN:   Hyperlipidemia History of hyperlipidemia. His last lipid profile performed 2 years ago showed an LDL 37. He is on simvastatin 20 mg followed by his PCP  Essential hypertension, benign History of hypertension with blood pressure measured at 154/70. He is on metoprolol and amlodipine. Continue current meds at current dosing  Coronary artery disease  involving native coronary artery of native heart without angina pectoris History of coronary artery disease status post remote LAD stenting in 1996 in [place]. I catheterized him 11/27/01 revealing a patent LAD stent with mild in-stent restenosis. His last Myoview performed marginal Way was nonischemic. He denies chest pain or increasing shortness of breath.      Lorretta Harp MD FACP,FACC,FAHA, [facility] 03/14/2015 3:29 PM

## 2015-03-15 ENCOUNTER — Ambulatory Visit: Payer: Medicare Other | Attending: Endocrinology | Admitting: Physical Therapy

## 2015-03-15 ENCOUNTER — Encounter: Payer: Self-pay | Admitting: Physical Therapy

## 2015-03-15 DIAGNOSIS — R262 Difficulty in walking, not elsewhere classified: Secondary | ICD-10-CM | POA: Insufficient documentation

## 2015-03-15 DIAGNOSIS — R5381 Other malaise: Secondary | ICD-10-CM | POA: Insufficient documentation

## 2015-03-15 NOTE — Therapy (Addendum)
[address] [place], [place], [address] Phone: [phone]   Fax:  [phone]  Physical Therapy Evaluation  Patient Details  Name: Guy [name] MRN: [id number] Date of Birth: 07/21/1920 Referring Provider:  Lorne Skeens, MD  Encounter Date: 03/15/2015    Past Medical History  Diagnosis Date  . Hypertension   . Diabetes mellitus without complication   . Arthritis   . Sciatica   . GERD (gastroesophageal reflux disease)   . Urge incontinence   . Vertigo   . Hyperlipidemia   . ED (erectile dysfunction)   . Gastric polyposis   . Osteoporosis   . Decreased libido   . Depression   . Hepatitis A   . Stroke   . History of blood transfusion     1946  . OA (osteoarthritis)   . Bladder cancer   . Prostate cancer     S/P prostatectomy; Lung metastasis  . CAD (coronary artery disease) 1996    stent to the LAD   . Hyperlipidemia   . Type 2 diabetes mellitus   . Ejection fraction   . COPD (chronic obstructive pulmonary disease)   . Ulcer of left heel     Past Surgical History  Procedure Laterality Date  . Carpal tunnel release    . Cataract extraction w/ intraocular lens  implant, bilateral  1998  . Prostatectomy    . Urinary sphincter implant    . Urinary sphincter implant revision    . Appendectomy    . Left hip surgery      Donated bone for bone graft to arm  . Penile prosthesis implant      S/P removal and re-implantation of new prosthesis  . Middle ear surgery    . Finger surgery      Left and right  . Hernia repair    . Bladder surgery    . Right arm bone graft      Pathological fracture  . Cardiac catheterization  11/27/2001    patent stent with mild in-stent restenosis.  . Myoview  06/12/2009    Persantine myoview EF 76%; Normal myoview  . Coronary angioplasty  1996    stenting to the LAD in [place].     There were no vitals filed for this visit.  Visit Diagnosis:   Debility - Plan: PT plan of care cert/re-cert  Difficulty walking - Plan: PT plan of care cert/re-cert                               PT Short Term Goals - 03/15/15 1614    PT SHORT TERM GOAL #1   Title Independent iwth initial HEP   Time 2   Period Weeks   Status New           PT Long Term Goals - 03/15/15 1616    PT LONG TERM GOAL #1   Title Transfer sit to stand independently   Time 4   Period Weeks   Status New   PT LONG TERM GOAL #2   Title decrease timed up and go test to 45 seconds   Time 8   Period Weeks   Status New   PT LONG TERM GOAL #3   Title increase ability to stand to 5 minutes   Time 8   Period Weeks   Status New  Problem List Patient Active Problem List   Diagnosis Date Noted  . Ulcer of left heel 03/14/2015  . Yeast infection 04/22/2014  . Type 2 diabetes mellitus without complication 43/20/0379  . Coronary artery disease involving native coronary artery of native heart without angina pectoris 04/13/2014  . Hypokalemia 04/08/2014  . Generalized weakness 04/08/2014  . GERD (gastroesophageal reflux disease) 04/08/2014  . Depression 04/08/2014  . Allergic rhinitis 04/08/2014  . RBBB 04/04/2014  . Malnutrition of moderate degree 04/04/2014  . Hypoglycemia 04/04/2014  . Acute kidney injury 04/04/2014  . Thrombocytopenia, unspecified 04/03/2014  . DIC (disseminated intravascular coagulation) 04/03/2014  . Elevated troponin 04/03/2014  . Prolonged Q-T interval on ECG 04/03/2014  . Ejection fraction   . Sepsis 04/02/2014  . UTI (lower urinary tract infection) 04/02/2014  . Chest pain 01/28/2014  . Pain 12/31/2013  . Lumbar transverse process fracture 12/29/2013  . Back pain 12/29/2013  . Fall 12/29/2013  . Thoracic compression fracture 12/29/2013  . Anemia of other chronic disease 04/29/2013  . Urinary incontinence 04/25/2013  . CAD (coronary artery disease) 02/18/2013  . Acute venous  embolism and thrombosis of deep vessels of proximal lower extremity 02/18/2013  . Hyperlipidemia   . Essential hypertension, benign 12/28/2012  . Prostate cancer metastatic to multiple sites 11/16/2012  . DM (diabetes mellitus) 11/16/2012  . Dehydration 11/16/2012  . Leukocytosis, unspecified 11/16/2012   PHYSICAL THERAPY DISCHARGE SUMMARY  Visits from Start of Care: 1   Plan: Patient agrees to discharge.  Patient goals were not met. Patient is being discharged due to not returning since the last visit.  ?????      Sumner Boast., PT 05/10/2015, 9:38 AM  [address] [place], [place], [address] Phone: [phone]   Fax:  [phone]

## 2015-03-20 ENCOUNTER — Ambulatory Visit: Payer: Medicare Other | Admitting: Physical Therapy

## 2015-03-23 ENCOUNTER — Ambulatory Visit: Payer: Medicare Other | Admitting: Physical Therapy

## 2015-03-27 ENCOUNTER — Ambulatory Visit: Payer: Medicare Other | Admitting: Physical Therapy

## 2015-04-05 ENCOUNTER — Ambulatory Visit: Payer: Medicare Other | Admitting: Physical Therapy

## 2015-04-12 ENCOUNTER — Ambulatory Visit: Payer: Medicare Other | Admitting: Physical Therapy

## 2015-04-14 ENCOUNTER — Ambulatory Visit: Payer: Medicare Other | Admitting: Cardiovascular Disease

## 2015-05-08 ENCOUNTER — Encounter (HOSPITAL_COMMUNITY): Payer: Self-pay | Admitting: Emergency Medicine

## 2015-05-08 ENCOUNTER — Emergency Department (HOSPITAL_COMMUNITY)
Admission: EM | Admit: 2015-05-08 | Discharge: 2015-05-08 | Discharge status: Against Medical Advice | Payer: Medicare Other | Attending: Emergency Medicine | Admitting: Emergency Medicine

## 2015-05-08 DIAGNOSIS — E119 Type 2 diabetes mellitus without complications: Secondary | ICD-10-CM | POA: Diagnosis not present

## 2015-05-08 DIAGNOSIS — I2510 Atherosclerotic heart disease of native coronary artery without angina pectoris: Secondary | ICD-10-CM | POA: Diagnosis not present

## 2015-05-08 DIAGNOSIS — J449 Chronic obstructive pulmonary disease, unspecified: Secondary | ICD-10-CM | POA: Insufficient documentation

## 2015-05-08 DIAGNOSIS — K5900 Constipation, unspecified: Secondary | ICD-10-CM | POA: Insufficient documentation

## 2015-05-08 DIAGNOSIS — I10 Essential (primary) hypertension: Secondary | ICD-10-CM | POA: Diagnosis not present

## 2015-05-08 LAB — CBC
HCT: 37.2 % — ABNORMAL LOW (ref 39.0–52.0)
Hemoglobin: 12.5 g/dL — ABNORMAL LOW (ref 13.0–17.0)
MCH: 33.4 pg (ref 26.0–34.0)
MCHC: 33.6 g/dL (ref 30.0–36.0)
MCV: 99.5 fL (ref 78.0–100.0)
Platelets: 174 10*3/uL (ref 150–400)
RBC: 3.74 MIL/uL — AB (ref 4.22–5.81)
RDW: 14 % (ref 11.5–15.5)
WBC: 8.8 10*3/uL (ref 4.0–10.5)

## 2015-05-08 LAB — COMPREHENSIVE METABOLIC PANEL
ALT: 15 U/L — ABNORMAL LOW (ref 17–63)
AST: 31 U/L (ref 15–41)
Albumin: 3.1 g/dL — ABNORMAL LOW (ref 3.5–5.0)
Alkaline Phosphatase: 92 U/L (ref 38–126)
Anion gap: 6 (ref 5–15)
BUN: 26 mg/dL — ABNORMAL HIGH (ref 6–20)
CO2: 26 mmol/L (ref 22–32)
CREATININE: 1.34 mg/dL — AB (ref 0.61–1.24)
Calcium: 9.6 mg/dL (ref 8.9–10.3)
Chloride: 104 mmol/L (ref 101–111)
GFR calc non Af Amer: 44 mL/min — ABNORMAL LOW (ref >60)
GFR, EST AFRICAN AMERICAN: 51 mL/min — AB (ref >60)
Glucose, Bld: 93 mg/dL (ref 65–99)
POTASSIUM: 3.9 mmol/L (ref 3.5–5.1)
SODIUM: 136 mmol/L (ref 135–145)
Total Bilirubin: 0.9 mg/dL (ref 0.3–1.2)
Total Protein: 7.4 g/dL (ref 6.5–8.1)

## 2015-05-08 LAB — LIPASE, BLOOD: Lipase: 70 U/L — ABNORMAL HIGH (ref 22–51)

## 2015-05-08 NOTE — ED Notes (Signed)
Pt's family member very upset and is yelling in the lobby saying that they have been here for a very long time and that they are going to leave.

## 2015-05-08 NOTE — ED Notes (Signed)
Pt A+Ox4, presents with c/o constipation x6 days, reports "only having small amounts".  Pt denies n/v or other complaints.  Skin PWD.  MAEI.  Speaking full/clear sentences, rr even/un-lab.  Abd s/nt/nd.  NAD.

## 2015-05-20 ENCOUNTER — Emergency Department (HOSPITAL_COMMUNITY): Payer: Medicare Other

## 2015-05-20 ENCOUNTER — Encounter (HOSPITAL_COMMUNITY): Payer: Self-pay | Admitting: Emergency Medicine

## 2015-05-20 ENCOUNTER — Inpatient Hospital Stay (HOSPITAL_COMMUNITY)
Admission: EM | Admit: 2015-05-20 | Discharge: 2015-05-24 | DRG: 638 | Disposition: A | Discharge status: Home Health | Payer: Medicare Other | Attending: Internal Medicine | Admitting: Internal Medicine

## 2015-05-20 DIAGNOSIS — I253 Aneurysm of heart: Secondary | ICD-10-CM | POA: Diagnosis present

## 2015-05-20 DIAGNOSIS — J449 Chronic obstructive pulmonary disease, unspecified: Secondary | ICD-10-CM | POA: Diagnosis present

## 2015-05-20 DIAGNOSIS — E118 Type 2 diabetes mellitus with unspecified complications: Secondary | ICD-10-CM | POA: Diagnosis not present

## 2015-05-20 DIAGNOSIS — F329 Major depressive disorder, single episode, unspecified: Secondary | ICD-10-CM

## 2015-05-20 DIAGNOSIS — I2583 Coronary atherosclerosis due to lipid rich plaque: Secondary | ICD-10-CM

## 2015-05-20 DIAGNOSIS — Z880 Allergy status to penicillin: Secondary | ICD-10-CM

## 2015-05-20 DIAGNOSIS — E872 Acidosis, unspecified: Secondary | ICD-10-CM

## 2015-05-20 DIAGNOSIS — Z9842 Cataract extraction status, left eye: Secondary | ICD-10-CM

## 2015-05-20 DIAGNOSIS — Z882 Allergy status to sulfonamides status: Secondary | ICD-10-CM

## 2015-05-20 DIAGNOSIS — C61 Malignant neoplasm of prostate: Secondary | ICD-10-CM | POA: Diagnosis present

## 2015-05-20 DIAGNOSIS — K6289 Other specified diseases of anus and rectum: Secondary | ICD-10-CM | POA: Diagnosis present

## 2015-05-20 DIAGNOSIS — Z91041 Radiographic dye allergy status: Secondary | ICD-10-CM

## 2015-05-20 DIAGNOSIS — I2510 Atherosclerotic heart disease of native coronary artery without angina pectoris: Secondary | ICD-10-CM | POA: Diagnosis not present

## 2015-05-20 DIAGNOSIS — Z961 Presence of intraocular lens: Secondary | ICD-10-CM | POA: Diagnosis present

## 2015-05-20 DIAGNOSIS — Z801 Family history of malignant neoplasm of trachea, bronchus and lung: Secondary | ICD-10-CM | POA: Diagnosis not present

## 2015-05-20 DIAGNOSIS — Z8052 Family history of malignant neoplasm of bladder: Secondary | ICD-10-CM | POA: Diagnosis not present

## 2015-05-20 DIAGNOSIS — Z794 Long term (current) use of insulin: Secondary | ICD-10-CM

## 2015-05-20 DIAGNOSIS — E11649 Type 2 diabetes mellitus with hypoglycemia without coma: Principal | ICD-10-CM | POA: Diagnosis present

## 2015-05-20 DIAGNOSIS — R748 Abnormal levels of other serum enzymes: Secondary | ICD-10-CM | POA: Diagnosis present

## 2015-05-20 DIAGNOSIS — R6883 Chills (without fever): Secondary | ICD-10-CM | POA: Diagnosis present

## 2015-05-20 DIAGNOSIS — I129 Hypertensive chronic kidney disease with stage 1 through stage 4 chronic kidney disease, or unspecified chronic kidney disease: Secondary | ICD-10-CM | POA: Diagnosis present

## 2015-05-20 DIAGNOSIS — K219 Gastro-esophageal reflux disease without esophagitis: Secondary | ICD-10-CM | POA: Diagnosis present

## 2015-05-20 DIAGNOSIS — E860 Dehydration: Secondary | ICD-10-CM | POA: Diagnosis present

## 2015-05-20 DIAGNOSIS — K5900 Constipation, unspecified: Secondary | ICD-10-CM | POA: Diagnosis present

## 2015-05-20 DIAGNOSIS — Z800 Family history of malignant neoplasm of digestive organs: Secondary | ICD-10-CM | POA: Diagnosis not present

## 2015-05-20 DIAGNOSIS — N179 Acute kidney failure, unspecified: Secondary | ICD-10-CM | POA: Diagnosis present

## 2015-05-20 DIAGNOSIS — Z79899 Other long term (current) drug therapy: Secondary | ICD-10-CM

## 2015-05-20 DIAGNOSIS — Z9841 Cataract extraction status, right eye: Secondary | ICD-10-CM

## 2015-05-20 DIAGNOSIS — E440 Moderate protein-calorie malnutrition: Secondary | ICD-10-CM

## 2015-05-20 DIAGNOSIS — B961 Klebsiella pneumoniae [K. pneumoniae] as the cause of diseases classified elsewhere: Secondary | ICD-10-CM | POA: Diagnosis present

## 2015-05-20 DIAGNOSIS — C7802 Secondary malignant neoplasm of left lung: Secondary | ICD-10-CM | POA: Diagnosis present

## 2015-05-20 DIAGNOSIS — Z955 Presence of coronary angioplasty implant and graft: Secondary | ICD-10-CM | POA: Diagnosis not present

## 2015-05-20 DIAGNOSIS — Z7982 Long term (current) use of aspirin: Secondary | ICD-10-CM | POA: Diagnosis not present

## 2015-05-20 DIAGNOSIS — E876 Hypokalemia: Secondary | ICD-10-CM

## 2015-05-20 DIAGNOSIS — I4510 Unspecified right bundle-branch block: Secondary | ICD-10-CM

## 2015-05-20 DIAGNOSIS — L97429 Non-pressure chronic ulcer of left heel and midfoot with unspecified severity: Secondary | ICD-10-CM

## 2015-05-20 DIAGNOSIS — Z888 Allergy status to other drugs, medicaments and biological substances status: Secondary | ICD-10-CM | POA: Diagnosis not present

## 2015-05-20 DIAGNOSIS — Z9109 Other allergy status, other than to drugs and biological substances: Secondary | ICD-10-CM | POA: Diagnosis not present

## 2015-05-20 DIAGNOSIS — Z803 Family history of malignant neoplasm of breast: Secondary | ICD-10-CM | POA: Diagnosis not present

## 2015-05-20 DIAGNOSIS — R6520 Severe sepsis without septic shock: Secondary | ICD-10-CM

## 2015-05-20 DIAGNOSIS — C7951 Secondary malignant neoplasm of bone: Secondary | ICD-10-CM | POA: Diagnosis present

## 2015-05-20 DIAGNOSIS — C7801 Secondary malignant neoplasm of right lung: Secondary | ICD-10-CM | POA: Diagnosis present

## 2015-05-20 DIAGNOSIS — E162 Hypoglycemia, unspecified: Secondary | ICD-10-CM

## 2015-05-20 DIAGNOSIS — Z8249 Family history of ischemic heart disease and other diseases of the circulatory system: Secondary | ICD-10-CM | POA: Diagnosis not present

## 2015-05-20 DIAGNOSIS — E119 Type 2 diabetes mellitus without complications: Secondary | ICD-10-CM

## 2015-05-20 DIAGNOSIS — E785 Hyperlipidemia, unspecified: Secondary | ICD-10-CM | POA: Diagnosis present

## 2015-05-20 DIAGNOSIS — N183 Chronic kidney disease, stage 3 (moderate): Secondary | ICD-10-CM | POA: Diagnosis present

## 2015-05-20 DIAGNOSIS — B379 Candidiasis, unspecified: Secondary | ICD-10-CM

## 2015-05-20 DIAGNOSIS — D72829 Elevated white blood cell count, unspecified: Secondary | ICD-10-CM

## 2015-05-20 DIAGNOSIS — I10 Essential (primary) hypertension: Secondary | ICD-10-CM

## 2015-05-20 DIAGNOSIS — N390 Urinary tract infection, site not specified: Secondary | ICD-10-CM

## 2015-05-20 DIAGNOSIS — R9430 Abnormal result of cardiovascular function study, unspecified: Secondary | ICD-10-CM

## 2015-05-20 DIAGNOSIS — D65 Disseminated intravascular coagulation [defibrination syndrome]: Secondary | ICD-10-CM

## 2015-05-20 DIAGNOSIS — E1122 Type 2 diabetes mellitus with diabetic chronic kidney disease: Secondary | ICD-10-CM | POA: Diagnosis present

## 2015-05-20 DIAGNOSIS — R778 Other specified abnormalities of plasma proteins: Secondary | ICD-10-CM

## 2015-05-20 DIAGNOSIS — Z66 Do not resuscitate: Secondary | ICD-10-CM | POA: Diagnosis present

## 2015-05-20 DIAGNOSIS — F32A Depression, unspecified: Secondary | ICD-10-CM

## 2015-05-20 DIAGNOSIS — R197 Diarrhea, unspecified: Secondary | ICD-10-CM | POA: Diagnosis present

## 2015-05-20 DIAGNOSIS — IMO0002 Reserved for concepts with insufficient information to code with codable children: Secondary | ICD-10-CM

## 2015-05-20 DIAGNOSIS — R7989 Other specified abnormal findings of blood chemistry: Secondary | ICD-10-CM | POA: Diagnosis not present

## 2015-05-20 DIAGNOSIS — J309 Allergic rhinitis, unspecified: Secondary | ICD-10-CM

## 2015-05-20 DIAGNOSIS — E43 Unspecified severe protein-calorie malnutrition: Secondary | ICD-10-CM

## 2015-05-20 DIAGNOSIS — R531 Weakness: Secondary | ICD-10-CM | POA: Diagnosis present

## 2015-05-20 DIAGNOSIS — R9431 Abnormal electrocardiogram [ECG] [EKG]: Secondary | ICD-10-CM

## 2015-05-20 DIAGNOSIS — M1990 Unspecified osteoarthritis, unspecified site: Secondary | ICD-10-CM | POA: Diagnosis present

## 2015-05-20 DIAGNOSIS — A419 Sepsis, unspecified organism: Secondary | ICD-10-CM

## 2015-05-20 DIAGNOSIS — R52 Pain, unspecified: Secondary | ICD-10-CM

## 2015-05-20 LAB — URINE MICROSCOPIC-ADD ON

## 2015-05-20 LAB — CBC
HCT: 37.3 % — ABNORMAL LOW (ref 39.0–52.0)
Hemoglobin: 12.8 g/dL — ABNORMAL LOW (ref 13.0–17.0)
MCH: 33.7 pg (ref 26.0–34.0)
MCHC: 34.3 g/dL (ref 30.0–36.0)
MCV: 98.2 fL (ref 78.0–100.0)
PLATELETS: 190 10*3/uL (ref 150–400)
RBC: 3.8 MIL/uL — AB (ref 4.22–5.81)
RDW: 14.3 % (ref 11.5–15.5)
WBC: 15.5 10*3/uL — AB (ref 4.0–10.5)

## 2015-05-20 LAB — COMPREHENSIVE METABOLIC PANEL
ALT: 16 U/L — AB (ref 17–63)
AST: 41 U/L (ref 15–41)
Albumin: 3.2 g/dL — ABNORMAL LOW (ref 3.5–5.0)
Alkaline Phosphatase: 69 U/L (ref 38–126)
Anion gap: 11 (ref 5–15)
BUN: 33 mg/dL — ABNORMAL HIGH (ref 6–20)
CALCIUM: 9.2 mg/dL (ref 8.9–10.3)
CHLORIDE: 106 mmol/L (ref 101–111)
CO2: 20 mmol/L — ABNORMAL LOW (ref 22–32)
Creatinine, Ser: 1.36 mg/dL — ABNORMAL HIGH (ref 0.61–1.24)
GFR, EST AFRICAN AMERICAN: 50 mL/min — AB (ref >60)
GFR, EST NON AFRICAN AMERICAN: 44 mL/min — AB (ref >60)
Glucose, Bld: 62 mg/dL — ABNORMAL LOW (ref 65–99)
Potassium: 3.8 mmol/L (ref 3.5–5.1)
Sodium: 137 mmol/L (ref 135–145)
Total Bilirubin: 1.4 mg/dL — ABNORMAL HIGH (ref 0.3–1.2)
Total Protein: 7.2 g/dL (ref 6.5–8.1)

## 2015-05-20 LAB — I-STAT CG4 LACTIC ACID, ED
LACTIC ACID, VENOUS: 3.42 mmol/L — AB (ref 0.5–2.0)
Lactic Acid, Venous: 4.59 mmol/L (ref 0.5–2.0)

## 2015-05-20 LAB — CBG MONITORING, ED
GLUCOSE-CAPILLARY: 60 mg/dL — AB (ref 65–99)
GLUCOSE-CAPILLARY: 212 mg/dL — AB (ref 65–99)

## 2015-05-20 LAB — TROPONIN I
TROPONIN I: 0.26 ng/mL — AB (ref <0.031)
Troponin I: 0.28 ng/mL — ABNORMAL HIGH (ref <0.031)

## 2015-05-20 LAB — I-STAT CHEM 8, ED
BUN: 32 mg/dL — AB (ref 6–20)
CHLORIDE: 104 mmol/L (ref 101–111)
CREATININE: 1.3 mg/dL — AB (ref 0.61–1.24)
Calcium, Ion: 1.19 mmol/L (ref 1.13–1.30)
Glucose, Bld: 51 mg/dL — ABNORMAL LOW (ref 65–99)
HEMATOCRIT: 39 % (ref 39.0–52.0)
Hemoglobin: 13.3 g/dL (ref 13.0–17.0)
Potassium: 4 mmol/L (ref 3.5–5.1)
SODIUM: 139 mmol/L (ref 135–145)
TCO2: 22 mmol/L (ref 0–100)

## 2015-05-20 LAB — I-STAT TROPONIN, ED: TROPONIN I, POC: 0.64 ng/mL — AB (ref 0.00–0.08)

## 2015-05-20 LAB — URINALYSIS, ROUTINE W REFLEX MICROSCOPIC
Glucose, UA: NEGATIVE mg/dL
Ketones, ur: NEGATIVE mg/dL
Nitrite: NEGATIVE
PROTEIN: 100 mg/dL — AB
Specific Gravity, Urine: 1.025 (ref 1.005–1.030)
UROBILINOGEN UA: 0.2 mg/dL (ref 0.0–1.0)
pH: 5 (ref 5.0–8.0)

## 2015-05-20 LAB — LIPASE, BLOOD: LIPASE: 54 U/L — AB (ref 22–51)

## 2015-05-20 MED ORDER — DEXTROSE 5 % IV SOLN
2.0000 g | INTRAVENOUS | Status: DC
  Administered 2015-05-20 – 2015-05-22 (×3): 2 g via INTRAVENOUS
  Filled 2015-05-20 (×3): qty 2

## 2015-05-20 MED ORDER — METRONIDAZOLE IN NACL 5-0.79 MG/ML-% IV SOLN
500.0000 mg | Freq: Once | INTRAVENOUS | Status: AC
Start: 2015-05-20 — End: 2015-05-20
  Administered 2015-05-20: 500 mg via INTRAVENOUS
  Filled 2015-05-20: qty 100

## 2015-05-20 MED ORDER — DEXTROSE 50 % IV SOLN
1.0000 | Freq: Once | INTRAVENOUS | Status: AC
Start: 2015-05-20 — End: 2015-05-20
  Administered 2015-05-20: 50 mL via INTRAVENOUS
  Filled 2015-05-20: qty 50

## 2015-05-20 MED ORDER — DEXTROSE-NACL 5-0.9 % IV SOLN
INTRAVENOUS | Status: DC
  Administered 2015-05-20: 22:00:00 via INTRAVENOUS

## 2015-05-20 MED ORDER — SODIUM CHLORIDE 0.9 % IV BOLUS (SEPSIS)
1000.0000 mL | INTRAVENOUS | Status: AC
  Administered 2015-05-20 (×2): 1000 mL via INTRAVENOUS

## 2015-05-20 MED ORDER — VANCOMYCIN HCL IN DEXTROSE 1-5 GM/200ML-% IV SOLN
1000.0000 mg | Freq: Once | INTRAVENOUS | Status: AC
  Administered 2015-05-20: 1000 mg via INTRAVENOUS
  Filled 2015-05-20: qty 200

## 2015-05-20 NOTE — ED Notes (Signed)
Pt BIB by wife from home after becoming weak today, fall x 7 per his wife. Pt [facility] by [facility] this morning and now has diarrhea. Pt is currently on oral chemo for prostate CA. Foley cath in place.

## 2015-05-20 NOTE — Progress Notes (Signed)
ANTIBIOTIC CONSULT NOTE - INITIAL  Pharmacy Consult for ceftazidine  Indication: rule out sepsis  Allergies  Allergen Reactions  . Albuterol Sulfate Hfa [Kdc:Albuterol] Shortness Of Breath and Swelling  . Adhesive [Tape] Other (See Comments)    REACTION: SKIN BLISTERS  . Albuterol Swelling    Per pt., side of his neck began to swell with nebulized Albuterol in doctor's office.  . Contrast Media [Iodinated Diagnostic Agents] Other (See Comments)    unknown Reaction unknown  . Ioxaglate Other (See Comments)    Reaction unknown  . Lactose     Other reaction(s): GI Upset (intolerance)  . Metrizamide Other (See Comments)    Reaction unknown  . Penicillins Other (See Comments) and Itching    REACTION: ITCHING HANDS  . Sulfa Drugs Cross Reactors Hives  . Budesonide-Formoterol Fumarate Rash  . Sulfamethoxazole Rash    Patient Measurements: Height: '5\' 6"'$  (167.6 cm) Weight: 120 lb (54.432 kg) IBW/kg (Calculated) : 63.8   Vital Signs: Temp: 98.9 F (37.2 C) (08/20 1957) Temp Source: Oral (08/20 1957) BP: 149/70 mmHg (08/20 1957) Pulse Rate: 82 (08/20 1957)  Labs:  Recent Labs  05/20/15 2011  WBC 15.5*  HGB 12.8*  PLT 190   Estimated Creatinine Clearance: 27.1 mL/min (by C-G formula based on Cr of 1.34). No results for input(s): VANCOTROUGH, VANCOPEAK, VANCORANDOM, GENTTROUGH, GENTPEAK, GENTRANDOM, TOBRATROUGH, TOBRAPEAK, TOBRARND, AMIKACINPEAK, AMIKACINTROU, AMIKACIN in the last 72 hours.   Microbiology: No results found for this or any previous visit (from the past 720 hour(s)).  Medical History: Past Medical History  Diagnosis Date  . Hypertension   . Diabetes mellitus without complication   . Arthritis   . Sciatica   . GERD (gastroesophageal reflux disease)   . Urge incontinence   . Vertigo   . Hyperlipidemia   . ED (erectile dysfunction)   . Gastric polyposis   . Osteoporosis   . Decreased libido   . Depression   . Hepatitis A   . Stroke   . History  of blood transfusion     1946  . OA (osteoarthritis)   . Bladder cancer   . Prostate cancer     S/P prostatectomy; Lung metastasis  . CAD (coronary artery disease) 1996    stent to the LAD   . Hyperlipidemia   . Type 2 diabetes mellitus   . Ejection fraction   . COPD (chronic obstructive pulmonary disease)   . Ulcer of left heel     Medications:  Scheduled:   Assessment: Patient CrCl is 27.42m/min.   Plan:  Adjust ceftazidine to 2 gm IV q24 h based on renal function F/U with patient renal function  PLenox Ponds8/20/2016,9:02 PM

## 2015-05-20 NOTE — H&P (Signed)
History and Physical:    Guy [name]   TZG:[id number] DOB: [birth year] DOA: 05/20/2015  Referring MD/provider: Dr. Kathrynn Humble PCP: [name], MD   Chief Complaint: Generalized weakness  History of Present Illness:   Guy [name] is an 81 y.o. male with a PMH of hypertension, diabetes mellitus, GERD, history of stroke, metastatic prostate cancer to the lungs and ribs (follows at [facility]), CAD with history of cardiac cath in 2004 and recent negative stress test, recent hospitalization for treatment of sepsis thought to be from UTI (but urine cultures negative), who was brought into the hospital with weakness, chills, abdominal discomfort, and diarrhea.  The patient tells me he had not had a bowel movement in several days, then was given an enema yesterday and subsequently had some loose stools thereafter.  The patient is very sleepy, and falls asleep when I try to interview him.  No family is present.  In the ED, the patient was noted to have an elevated lactate and serum troponin.  He specifically denies chest pain but endorses shortness of breath. CT of the abdomen and pelvis was only notable for proctitis (in the setting of having been given an enema for treatment of constipation). CT of the head was negative for acute findings. Patient was noted to have a lactic acidosis, hypoglycemia and acute kidney injury on routine labs.   ROS:   Review of Systems  Constitutional: Positive for weight loss and malaise/fatigue. Negative for fever and chills.  HENT: Negative.   Eyes: Negative.   Respiratory: Positive for shortness of breath. Negative for cough.   Cardiovascular: Negative for chest pain, palpitations and leg swelling.  Gastrointestinal: Positive for abdominal pain, diarrhea and constipation. Negative for nausea and vomiting.  Genitourinary: Negative.   Musculoskeletal: Negative.   Skin: Negative.   Neurological: Positive for weakness.  Endo/Heme/Allergies:  Bruises/bleeds easily.  Psychiatric/Behavioral: Negative.     Past Medical History:   Past Medical History  Diagnosis Date  . Hypertension   . Diabetes mellitus without complication   . Arthritis   . Sciatica   . GERD (gastroesophageal reflux disease)   . Urge incontinence   . Vertigo   . Hyperlipidemia   . ED (erectile dysfunction)   . Gastric polyposis   . Osteoporosis   . Decreased libido   . Depression   . Hepatitis A   . Stroke   . History of blood transfusion     1946  . OA (osteoarthritis)   . Bladder cancer   . Prostate cancer     S/P prostatectomy; Lung metastasis  . CAD (coronary artery disease) 1996    stent to the LAD   . Hyperlipidemia   . Type 2 diabetes mellitus   . Ejection fraction   . COPD (chronic obstructive pulmonary disease)   . Ulcer of left heel     Past Surgical History:   Past Surgical History  Procedure Laterality Date  . Carpal tunnel release    . Cataract extraction w/ intraocular lens  implant, bilateral  1998  . Prostatectomy    . Urinary sphincter implant    . Urinary sphincter implant revision    . Appendectomy    . Left hip surgery      Donated bone for bone graft to arm  . Penile prosthesis implant      S/P removal and re-implantation of new prosthesis  . Middle ear surgery    . Finger surgery      Left  and right  . Hernia repair    . Bladder surgery    . Right arm bone graft      Pathological fracture  . Cardiac catheterization  11/27/2001    patent stent with mild in-stent restenosis.  . Myoview  06/12/2009    Persantine myoview EF 76%; Normal myoview  . Coronary angioplasty  1996    stenting to the LAD in [place].     Social History:   Social History   Social History  . Marital Status: Married    Spouse Name: [name]  . Number of Children: 3  . Years of Education: N/A   Occupational History  . Business owner, Press photographer, retired    Social History Main Topics  . Smoking status: Former Research scientist (life sciences)  . Smokeless  tobacco: Never Used  . Alcohol Use: No  . Drug Use: No  . Sexual Activity: No   Other Topics Concern  . Not on file   Social History Narrative   Married.  Lives with his wife.  Ambulates with a walker and a cane.    Family history:   Family History  Problem Relation Age of Onset  . Heart failure Mother   . Heart disease Mother   . Bladder Cancer Father   . Pancreatic cancer Sister   . Lung cancer Sister   . Breast cancer Daughter   . Liver disease Son     Allergies   Albuterol sulfate hfa; Adhesive; Albuterol; Contrast media; Ioxaglate; Lactose; Metrizamide; Penicillins; Sulfa drugs cross reactors; Budesonide-formoterol fumarate; and Sulfamethoxazole  Current Medications:   Prior to Admission medications   Medication Sig Start Date End Date Taking? Authorizing Provider  amLODipine (NORVASC) 10 MG tablet Take 5 mg by mouth daily.    Yes Historical Provider, MD  aspirin 81 MG tablet Take 81 mg by mouth daily. For heart theraphy   Yes Historical Provider, MD  Calcium Carbonate-Vitamin D (CALCIUM 600+D HIGH POTENCY) 600-400 MG-UNIT per tablet Take 1 tablet by mouth 2 (two) times daily.     Yes Historical Provider, MD  CRANBERRY CONCENTRATE PO Take 4 capsules by mouth daily.   Yes Historical Provider, MD  cycloSPORINE (RESTASIS) 0.05 % ophthalmic emulsion Place 1 drop into both eyes 2 (two) times daily.   Yes Historical Provider, MD  enzalutamide Gillermina Phy) 40 MG capsule Take 160 mg by mouth daily.   Yes Historical Provider, MD  fluticasone (FLONASE) 50 MCG/ACT nasal spray Place 2 sprays into the nose 2 (two) times daily.   Yes Historical Provider, MD  gabapentin (NEURONTIN) 100 MG capsule Take 1 capsule by mouth at bedtime. 02/24/15  Yes Historical Provider, MD  insulin aspart (NOVOLOG) 100 UNIT/ML injection Inject 0-6 Units into the skin 3 (three) times daily with meals. 0-150=0; 151-200=2u; 201-250=3u; 251-300=4u; 301-350=5u; over 350 =6u prior to meals. With breakfast and lunch.  01/18/13  Yes Gerlene Fee, NP  insulin glargine (LANTUS) 100 UNIT/ML injection Inject 16 Units into the skin daily.    Yes Historical Provider, MD  lipase/protease/amylase (CREON-12/PANCREASE) 12000 UNITS CPEP Take 2 capsules by mouth 3 (three) times daily. For AKI   Yes Historical Provider, MD  Melatonin 5 MG TABS Take 5 mg by mouth at bedtime. For insomnia   Yes Historical Provider, MD  metoprolol succinate (TOPROL-XL) 25 MG 24 hr tablet Take 12.5 mg by mouth daily. For HTN   Yes Historical Provider, MD  Multiple Vitamins-Minerals (MULTIVITAMINS THER. W/MINERALS) TABS Take 1 tablet by mouth every morning.  Yes Historical Provider, MD  omeprazole (PRILOSEC) 20 MG capsule Take 20 mg by mouth 2 (two) times daily before a meal. For GERD   Yes Historical Provider, MD  oxybutynin (DITROPAN) 5 MG tablet Take 5 mg by mouth daily. For bladder urgency   Yes Historical Provider, MD  sertraline (ZOLOFT) 100 MG tablet Take 100 mg by mouth 2 (two) times daily. For depression   Yes Historical Provider, MD  simvastatin (ZOCOR) 20 MG tablet Take 10 mg by mouth every other day. Take 1/2 tablet =10 mg for HLD.   Yes Historical Provider, MD  bicalutamide (CASODEX) 50 MG tablet Take 50 mg by mouth daily.    Historical Provider, MD  camphor-menthol Timoteo Ace) lotion Apply 1 application topically daily as needed for itching.    Historical Provider, MD  hydrOXYzine (ATARAX/VISTARIL) 25 MG tablet Take 1 tablet (25 mg total) by mouth 3 (three) times daily as needed for anxiety or itching. 04/07/14   Verlee Monte, MD  mupirocin ointment (BACTROBAN) 2 % Apply 1 application topically daily. Patient not taking: Reported on 05/20/2015 12/18/14   Roselee Culver, MD  nitroGLYCERIN (NITROSTAT) 0.4 MG SL tablet Place 0.4 mg under the tongue.    Historical Provider, MD  silver sulfADIAZINE (SILVADENE) 1 % cream Apply 1 application topically 2 (two) times daily. 10/25/14 10/25/15  Historical Provider, MD  triamcinolone ointment  (KENALOG) 0.1 % Apply 1 application topically 2 (two) times daily. 10/25/14 10/25/15  Historical Provider, MD    Physical Exam:   Filed Vitals:   05/20/15 2100 05/20/15 2130 05/20/15 2230 05/20/15 2330  BP: 136/69 134/64 150/67 126/75  Pulse: 71 73 73 76  Temp:      TempSrc:      Resp: '23 27 27 23  '$ Height:      Weight:      SpO2: 98% 98% 99% 97%     Physical Exam: Blood pressure 126/75, pulse 76, temperature 98.9 F (37.2 C), temperature source Oral, resp. rate 23, height '5\' 6"'$  (1.676 m), weight 54.432 kg (120 lb), SpO2 97 %. Gen: No acute distress. Thin, cachectic appearing. Head: Normocephalic, atraumatic. Eyes: PERRL, EOMI, sclerae nonicteric. Mouth: Oropharynx clear. Neck: Supple, no thyromegaly, no lymphadenopathy, no jugular venous distention. Chest: Lungs diminished but clear throughout. CV: Heart sounds are regular. No murmurs, rubs, or gallops. Abdomen: Soft, nontender, nondistended with normal active bowel sounds. Extremities: Extremities are without clubbing, edema, or cyanosis. Skin: Warm and dry. Scattered ecchymotic areas to the upper extremities. Neuro: Sleepy and somnolent; grossly nonfocal. Psych: Mood and affect normal.   Data Review:    Labs: Basic Metabolic Panel:  Recent Labs Lab 05/20/15 2011 05/20/15 2118  NA 137 139  K 3.8 4.0  CL 106 104  CO2 20*  --   GLUCOSE 62* 51*  BUN 33* 32*  CREATININE 1.36* 1.30*  CALCIUM 9.2  --    Liver Function Tests:  Recent Labs Lab 05/20/15 2011  AST 41  ALT 16*  ALKPHOS 69  BILITOT 1.4*  PROT 7.2  ALBUMIN 3.2*    Recent Labs Lab 05/20/15 2011  LIPASE 54*   CBC:  Recent Labs Lab 05/20/15 2011 05/20/15 2118  WBC 15.5*  --   HGB 12.8* 13.3  HCT 37.3* 39.0  MCV 98.2  --   PLT 190  --    Cardiac Enzymes:  Recent Labs Lab 05/20/15 2022 05/20/15 2309  TROPONINI 0.26* 0.28*   CBG:  Recent Labs Lab 05/20/15 2000 05/20/15 2224  GLUCAP 60*  212*    Radiographic Studies: Ct  Abdomen Pelvis Wo Contrast  05/20/2015   CLINICAL DATA:  Headache and diarrhea.  EXAM: CT ABDOMEN AND PELVIS WITHOUT CONTRAST  TECHNIQUE: Multidetector CT imaging of the abdomen and pelvis was performed following the standard protocol without IV contrast.  COMPARISON:  09/05/2005  FINDINGS: BODY WALL: Mild gynecomastia asymmetric to the left.  Mild anasarca.  LOWER CHEST: Gastroesophageal reflux. Small left pleural effusion with dependent atelectasis.  ABDOMEN/PELVIS:  Liver: No focal abnormality.  Biliary: Cholecystectomy with mild enlargement of the common bile duct, likely reservoir effect.  Pancreas: No acute finding.  Spleen: Unremarkable.  Adrenals: Unremarkable.  Kidneys and ureters: Bilateral renal cortical cysts. Bilateral renal atrophy without hydronephrosis. Punctate bilateral renal calcifications could be arterial or urinary.  Bladder: Decompressed by a Foley catheter.  Reproductive: Prostatectomy and pelvic lymphadenectomy. No evidence of pelvic adenopathy. There is a penile prosthesis with deflated reservoir ventrally.  Bowel: Circumferential thickening of the rectum with extensive mesorectal edema. There is prominent colonic stool and gas above the inflammation. No indication of previous radiation therapy. There is stool within the inflamed rectum but not as large as usually seen with stercoral colitis.  Retroperitoneum: Pelvic lymphadenectomy.  No evidence of adenopathy.  Peritoneum: No ascites or pneumoperitoneum.  Vascular: No acute abnormality.  OSSEOUS: Remote L1 and L3 compression fractures status post vertebroplasty. Bilateral sacroplasty. Advanced lumbar degenerative disc disease with dextroscoliosis. Profound osteopenia.  IMPRESSION: 1. Proctitis. 2. Moderate retention of stool and gas above the inflamed rectum. 3. Chronic findings are noted above.   Electronically Signed   By: Monte Fantasia M.D.   On: 05/20/2015 22:47   Ct Head Wo Contrast  05/20/2015   CLINICAL DATA:  Headache.  EXAM:  CT HEAD WITHOUT CONTRAST  TECHNIQUE: Contiguous axial images were obtained from the base of the skull through the vertex without intravenous contrast.  COMPARISON:  04/02/2014  FINDINGS: Skull and Sinuses:  No acute fracture or destructive process.  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  Chronic right maxillary sinusitis containing partly calcified debris, stable from 2015. No surrounding fat inflammation.  Orbits: Bilateral cataract resection.  Brain: No evidence of acute infarction, hemorrhage, hydrocephalus, or mass lesion/mass effect.  Generalized cortical atrophy which is stable from 2015. Extensive chronic small vessel disease with gliosis throughout the bilateral cerebral white matter and bilateral deep gray nuclei, left corona radiata, and bilateral cerebellar small vessel infarcts. Further indistinct appearance of the basal ganglia is likely from dilated perivascular spaces.  IMPRESSION: 1. No acute finding. 2. Brain atrophy and extensive chronic small vessel disease.   Electronically Signed   By: Monte Fantasia M.D.   On: 05/20/2015 22:52   Dg Chest Port 1 View  05/20/2015   CLINICAL DATA:  History hypertension and COPD. Status post fall today.  EXAM: PORTABLE CHEST - 1 VIEW  COMPARISON:  April 02, 2014  FINDINGS: The heart size and mediastinal contours are stable. The heart size is enlarged. There is no focal infiltrate, pulmonary edema, or pleural effusion. Chronic mild opacity over the left mid lung possibly calcified pleural plaques are unchanged. Chronic deformities of the right humerus, left clavicle, multiple left ribs are unchanged.  IMPRESSION: No active cardiopulmonary disease.  Stable chronic changes.   Electronically Signed   By: Abelardo Diesel M.D.   On: 05/20/2015 21:24   *I have personally reviewed the images above*  EKG: Independently reviewed. Normal sinus rhythm at 75 bpm.  PACs, RBBB and LAFB. Probable old anteroseptal  infarct. No acute ischemic  changes.   Assessment/Plan:   Principal Problem:   Generalized weakness, multifactorial - Patient is known to have advanced prostate cancer and comes in with dehydration and hypoglycemia which are likely contributory to presenting complaints.  - Given that he is a DO NOT RESUSCITATE, would request palliative care consultation in the morning to address overall goals of care if his condition has not improved.  - Although his lactic acid is elevated, the patient is relatively nontoxic appearing. His chest x-ray is clear. Proctitis is likely related to constipation and having had an enema on the date of admission.  - Blood and urine cultures were requested in the ED. Follow-up results.   Active Problems:   Abdominal pain/diarrhea/proctitis - Would avoid checking for C. difficile given that he was treated with an enema yesterday which precipitated the diarrhea. He has otherwise been fairly constipated. - CT of abdomen/pelvis only showed proctitis, likely from having had an enema, with stool above the inflamed rectum. - Would start on a bowel regimen.    Prostate cancer metastatic to multiple sites - Followed by urology/oncology at [place].    Dehydration / acute kidney injury - Hydrate and monitor kidney function. Has a chronic indwelling Foley.  - No hydronephrosis noted on CT of the abdomen/pelvis. - Current creatinine slightly elevated over usual baseline values (baseline creatinine 1.2, current creatinine 1.3).     Leukocytosis/lactic acidosis  - May be reflective of a possible infection versus hemoconcentration.  - Was started on Fortaz in the ED. We'll continue this until infection ruled out, particularly from a urinary source given his chronic indwelling Foley.  - Repeat serum lactate in the morning.    Elevated troponin - Continue aspirin. Cycle troponins. No current complaints of chest pains.     Hypoglycemia / Type 2 diabetes mellitus without  complication - Resolved with dextrose infusion given in the ED. Hold insulin for now, but consider resuming in the morning if blood glucose is elevated.     DVT prophylaxis - Lovenox ordered.   Code Status: DNR Family Communication: No family currently at the bedside, did not call as he lives with his wife and it is late in the night.  Disposition Plan: Home when stable, likely 1-2 days.  Time spent: one hour.  Solangel Mcmanaway [facility] Pager [phone] Cell: [phone]   If 7PM-7AM, please contact night-coverage [email or web address] Password [email or web address] 05/21/2015, 12:06 AM

## 2015-05-20 NOTE — ED Notes (Signed)
Bed: WA05 Expected date:  Expected time:  Means of arrival:  Comments: 

## 2015-05-20 NOTE — ED Provider Notes (Addendum)
CSN: [id number]     Arrival date & time 05/20/15  1942 History   First MD Initiated Contact with Patient 05/20/15 2013     Chief Complaint  Patient presents with  . Weakness     (Consider location/radiation/quality/duration/timing/severity/associated sxs/prior Treatment) HPI Comments: Pt present to the ER with wife with cc of weakness. Pt is gradually getting weak over the last few days, having trouble with ambulating. Pt has hada fall earlier today. He complains of abd pain an dchills. No fevers. Pt has a indwelling foley catheter. Also has hx of prostate CA and takes oral chemo.  The history is provided by the patient.    Past Medical History  Diagnosis Date  . Hypertension   . Diabetes mellitus without complication   . Arthritis   . Sciatica   . GERD (gastroesophageal reflux disease)   . Urge incontinence   . Vertigo   . Hyperlipidemia   . ED (erectile dysfunction)   . Gastric polyposis   . Osteoporosis   . Decreased libido   . Depression   . Hepatitis A   . Stroke   . History of blood transfusion     1946  . OA (osteoarthritis)   . Bladder cancer   . Prostate cancer     S/P prostatectomy; Lung metastasis  . CAD (coronary artery disease) 1996    stent to the LAD   . Hyperlipidemia   . Type 2 diabetes mellitus   . Ejection fraction   . COPD (chronic obstructive pulmonary disease)   . Ulcer of left heel    Past Surgical History  Procedure Laterality Date  . Carpal tunnel release    . Cataract extraction w/ intraocular lens  implant, bilateral  1998  . Prostatectomy    . Urinary sphincter implant    . Urinary sphincter implant revision    . Appendectomy    . Left hip surgery      Donated bone for bone graft to arm  . Penile prosthesis implant      S/P removal and re-implantation of new prosthesis  . Middle ear surgery    . Finger surgery      Left and right  . Hernia repair    . Bladder surgery    . Right arm bone graft      Pathological fracture  .  Cardiac catheterization  11/27/2001    patent stent with mild in-stent restenosis.  . Myoview  06/12/2009    Persantine myoview EF 76%; Normal myoview  . Coronary angioplasty  1996    stenting to the LAD in [place].    Family History  Problem Relation Age of Onset  . Heart failure Mother   . Heart disease Mother   . Bladder Cancer Father   . Pancreatic cancer Sister   . Lung cancer Sister   . Breast cancer Daughter   . Liver disease Son    Social History  Substance Use Topics  . Smoking status: Former Research scientist (life sciences)  . Smokeless tobacco: Never Used  . Alcohol Use: No    Review of Systems  Constitutional: Positive for chills and fatigue.  Gastrointestinal: Positive for abdominal pain.  Allergic/Immunologic: Positive for immunocompromised state.  Neurological: Positive for weakness.  All other systems reviewed and are negative.     Allergies  Albuterol sulfate hfa; Adhesive; Albuterol; Contrast media; Ioxaglate; Lactose; Metrizamide; Penicillins; Sulfa drugs cross reactors; Budesonide-formoterol fumarate; and Sulfamethoxazole  Home Medications   Prior to Admission medications  Medication Sig Start Date End Date Taking? Authorizing Provider  amLODipine (NORVASC) 10 MG tablet Take 5 mg by mouth daily.    Yes Historical Provider, MD  aspirin 81 MG tablet Take 81 mg by mouth daily. For heart theraphy   Yes Historical Provider, MD  Calcium Carbonate-Vitamin D (CALCIUM 600+D HIGH POTENCY) 600-400 MG-UNIT per tablet Take 1 tablet by mouth 2 (two) times daily.     Yes Historical Provider, MD  CRANBERRY CONCENTRATE PO Take 4 capsules by mouth daily.   Yes Historical Provider, MD  cycloSPORINE (RESTASIS) 0.05 % ophthalmic emulsion Place 1 drop into both eyes 2 (two) times daily.   Yes Historical Provider, MD  enzalutamide Gillermina Phy) 40 MG capsule Take 160 mg by mouth daily.   Yes Historical Provider, MD  fluticasone (FLONASE) 50 MCG/ACT nasal spray Place 2 sprays into the nose 2 (two)  times daily.   Yes Historical Provider, MD  gabapentin (NEURONTIN) 100 MG capsule Take 1 capsule by mouth at bedtime. 02/24/15  Yes Historical Provider, MD  insulin aspart (NOVOLOG) 100 UNIT/ML injection Inject 0-6 Units into the skin 3 (three) times daily with meals. 0-150=0; 151-200=2u; 201-250=3u; 251-300=4u; 301-350=5u; over 350 =6u prior to meals. With breakfast and lunch. 01/18/13  Yes Gerlene Fee, NP  insulin glargine (LANTUS) 100 UNIT/ML injection Inject 16 Units into the skin daily.    Yes Historical Provider, MD  lipase/protease/amylase (CREON-12/PANCREASE) 12000 UNITS CPEP Take 2 capsules by mouth 3 (three) times daily. For AKI   Yes Historical Provider, MD  Melatonin 5 MG TABS Take 5 mg by mouth at bedtime. For insomnia   Yes Historical Provider, MD  metoprolol succinate (TOPROL-XL) 25 MG 24 hr tablet Take 12.5 mg by mouth daily. For HTN   Yes Historical Provider, MD  Multiple Vitamins-Minerals (MULTIVITAMINS THER. W/MINERALS) TABS Take 1 tablet by mouth every morning.    Yes Historical Provider, MD  omeprazole (PRILOSEC) 20 MG capsule Take 20 mg by mouth 2 (two) times daily before a meal. For GERD   Yes Historical Provider, MD  oxybutynin (DITROPAN) 5 MG tablet Take 5 mg by mouth daily. For bladder urgency   Yes Historical Provider, MD  sertraline (ZOLOFT) 100 MG tablet Take 100 mg by mouth 2 (two) times daily. For depression   Yes Historical Provider, MD  simvastatin (ZOCOR) 20 MG tablet Take 10 mg by mouth every other day. Take 1/2 tablet =10 mg for HLD.   Yes Historical Provider, MD  bicalutamide (CASODEX) 50 MG tablet Take 50 mg by mouth daily.    Historical Provider, MD  camphor-menthol Timoteo Ace) lotion Apply 1 application topically daily as needed for itching.    Historical Provider, MD  hydrOXYzine (ATARAX/VISTARIL) 25 MG tablet Take 1 tablet (25 mg total) by mouth 3 (three) times daily as needed for anxiety or itching. 04/07/14   Verlee Monte, MD  mupirocin ointment (BACTROBAN) 2 %  Apply 1 application topically daily. Patient not taking: Reported on 05/20/2015 12/18/14   Roselee Culver, MD  nitroGLYCERIN (NITROSTAT) 0.4 MG SL tablet Place 0.4 mg under the tongue.    Historical Provider, MD  silver sulfADIAZINE (SILVADENE) 1 % cream Apply 1 application topically 2 (two) times daily. 10/25/14 10/25/15  Historical Provider, MD  triamcinolone ointment (KENALOG) 0.1 % Apply 1 application topically 2 (two) times daily. 10/25/14 10/25/15  Historical Provider, MD   BP 150/67 mmHg  Pulse 73  Temp(Src) 98.9 F (37.2 C) (Oral)  Resp 27  Ht '5\' 6"'$  (1.676 m)  Wt 120 lb (54.432 kg)  BMI 19.38 kg/m2  SpO2 99% Physical Exam  Constitutional: He appears well-developed.  HENT:  Head: Normocephalic and atraumatic.  Eyes: Conjunctivae and EOM are normal.  Neck: Neck supple.  Cardiovascular: Normal rate and regular rhythm.   Pulmonary/Chest: Effort normal and breath sounds normal. He has no rales.  Abdominal: Soft. Bowel sounds are normal. He exhibits no distension. There is tenderness. There is no rebound and no guarding.  Neurological: He is alert.  Skin: Skin is warm.  Nursing note and vitals reviewed.   ED Course  Procedures (including critical care time) Labs Review Labs Reviewed  URINALYSIS, ROUTINE W REFLEX MICROSCOPIC (NOT AT [facility]) - Abnormal; Notable for the following:    Color, Urine AMBER (*)    APPearance CLOUDY (*)    Hgb urine dipstick TRACE (*)    Bilirubin Urine SMALL (*)    Protein, ur 100 (*)    Leukocytes, UA MODERATE (*)    All other components within normal limits  LIPASE, BLOOD - Abnormal; Notable for the following:    Lipase 54 (*)    All other components within normal limits  COMPREHENSIVE METABOLIC PANEL - Abnormal; Notable for the following:    CO2 20 (*)    Glucose, Bld 62 (*)    BUN 33 (*)    Creatinine, Ser 1.36 (*)    Albumin 3.2 (*)    ALT 16 (*)    Total Bilirubin 1.4 (*)    GFR calc non Af Amer 44 (*)    GFR calc Af Amer 50 (*)     All other components within normal limits  CBC - Abnormal; Notable for the following:    WBC 15.5 (*)    RBC 3.80 (*)    Hemoglobin 12.8 (*)    HCT 37.3 (*)    All other components within normal limits  TROPONIN I - Abnormal; Notable for the following:    Troponin I 0.26 (*)    All other components within normal limits  URINE MICROSCOPIC-ADD ON - Abnormal; Notable for the following:    Bacteria, UA MANY (*)    Casts HYALINE CASTS (*)    All other components within normal limits  CBG MONITORING, ED - Abnormal; Notable for the following:    Glucose-Capillary 60 (*)    All other components within normal limits  CBG MONITORING, ED - Abnormal; Notable for the following:    Glucose-Capillary 212 (*)    All other components within normal limits  I-STAT TROPOININ, ED - Abnormal; Notable for the following:    Troponin i, poc 0.64 (*)    All other components within normal limits  I-STAT CG4 LACTIC ACID, ED - Abnormal; Notable for the following:    Lactic Acid, Venous 4.59 (*)    All other components within normal limits  I-STAT CHEM 8, ED - Abnormal; Notable for the following:    BUN 32 (*)    Creatinine, Ser 1.30 (*)    Glucose, Bld 51 (*)    All other components within normal limits  CULTURE, BLOOD (ROUTINE X 2)  CULTURE, BLOOD (ROUTINE X 2)  URINE CULTURE  TROPONIN I  CBG MONITORING, ED  I-STAT CG4 LACTIC ACID, ED  I-STAT CG4 LACTIC ACID, ED  CBG MONITORING, ED  I-STAT CG4 LACTIC ACID, ED    Imaging Review Ct Abdomen Pelvis Wo Contrast  05/20/2015   CLINICAL DATA:  Headache and diarrhea.  EXAM: CT ABDOMEN AND PELVIS WITHOUT CONTRAST  TECHNIQUE: Multidetector CT imaging of the abdomen and pelvis was performed following the standard protocol without IV contrast.  COMPARISON:  09/05/2005  FINDINGS: BODY WALL: Mild gynecomastia asymmetric to the left.  Mild anasarca.  LOWER CHEST: Gastroesophageal reflux. Small left pleural effusion with dependent atelectasis.  ABDOMEN/PELVIS:   Liver: No focal abnormality.  Biliary: Cholecystectomy with mild enlargement of the common bile duct, likely reservoir effect.  Pancreas: No acute finding.  Spleen: Unremarkable.  Adrenals: Unremarkable.  Kidneys and ureters: Bilateral renal cortical cysts. Bilateral renal atrophy without hydronephrosis. Punctate bilateral renal calcifications could be arterial or urinary.  Bladder: Decompressed by a Foley catheter.  Reproductive: Prostatectomy and pelvic lymphadenectomy. No evidence of pelvic adenopathy. There is a penile prosthesis with deflated reservoir ventrally.  Bowel: Circumferential thickening of the rectum with extensive mesorectal edema. There is prominent colonic stool and gas above the inflammation. No indication of previous radiation therapy. There is stool within the inflamed rectum but not as large as usually seen with stercoral colitis.  Retroperitoneum: Pelvic lymphadenectomy.  No evidence of adenopathy.  Peritoneum: No ascites or pneumoperitoneum.  Vascular: No acute abnormality.  OSSEOUS: Remote L1 and L3 compression fractures status post vertebroplasty. Bilateral sacroplasty. Advanced lumbar degenerative disc disease with dextroscoliosis. Profound osteopenia.  IMPRESSION: 1. Proctitis. 2. Moderate retention of stool and gas above the inflamed rectum. 3. Chronic findings are noted above.   Electronically Signed   By: Monte Fantasia M.D.   On: 05/20/2015 22:47   Ct Head Wo Contrast  05/20/2015   CLINICAL DATA:  Headache.  EXAM: CT HEAD WITHOUT CONTRAST  TECHNIQUE: Contiguous axial images were obtained from the base of the skull through the vertex without intravenous contrast.  COMPARISON:  04/02/2014  FINDINGS: Skull and Sinuses:  No acute fracture or destructive process.  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  Chronic right maxillary sinusitis containing partly calcified debris, stable from 2015. No surrounding fat inflammation.  Orbits: Bilateral cataract resection.  Brain:  No evidence of acute infarction, hemorrhage, hydrocephalus, or mass lesion/mass effect.  Generalized cortical atrophy which is stable from 2015. Extensive chronic small vessel disease with gliosis throughout the bilateral cerebral white matter and bilateral deep gray nuclei, left corona radiata, and bilateral cerebellar small vessel infarcts. Further indistinct appearance of the basal ganglia is likely from dilated perivascular spaces.  IMPRESSION: 1. No acute finding. 2. Brain atrophy and extensive chronic small vessel disease.   Electronically Signed   By: Monte Fantasia M.D.   On: 05/20/2015 22:52   Dg Chest Port 1 View  05/20/2015   CLINICAL DATA:  History hypertension and COPD. Status post fall today.  EXAM: PORTABLE CHEST - 1 VIEW  COMPARISON:  April 02, 2014  FINDINGS: The heart size and mediastinal contours are stable. The heart size is enlarged. There is no focal infiltrate, pulmonary edema, or pleural effusion. Chronic mild opacity over the left mid lung possibly calcified pleural plaques are unchanged. Chronic deformities of the right humerus, left clavicle, multiple left ribs are unchanged.  IMPRESSION: No active cardiopulmonary disease.  Stable chronic changes.   Electronically Signed   By: Abelardo Diesel M.D.   On: 05/20/2015 21:24   I have personally reviewed and evaluated these images and lab results as part of my medical decision-making.   EKG Interpretation   Date/Time:  Saturday May 20 2015 20:27:06 EDT Ventricular Rate:  75 PR Interval:  186 QRS Duration: 132 QT Interval:  448 QTC Calculation: 500 R Axis:   -93 Text  Interpretation:  Sinus rhythm Atrial premature complexes RBBB and  LAFB Probable anteroseptal infarct, old No acute changes Confirmed by  Kathrynn Humble, MD, Thelma Comp [phone]) on 05/20/2015 8:39:12 PM      MDM   Final diagnoses:  Lactic acidosis  Severe sepsis with acute organ dysfunction  Elevated troponin    Pt comes in with cc of weakness. He has chills. Pt is  immunosuppressed. Pt is tachypneic. He has abd pain, L sided and a headache with normal neuro exam.  LAbs show lactic acidosis and elevated trop. No chest pain, no dib. Pt has no other SIRS critieria. Broad spectrum antibiotics started, CT ordered, fluids started. Pt is DNR/DNI.  Ct results reviewed. There is some proctitis. Repeat lactate post 1 liter is improved. CBG improved.  Will admit to step down icu.   CRITICAL CARE Performed by: Varney Biles   Total critical care time: 38 min - severe sepsis  Critical care time was exclusive of separately billable procedures and treating other patients.  Critical care was necessary to treat or prevent imminent or life-threatening deterioration.  Critical care was time spent personally by me on the following activities: development of treatment plan with patient and/or surrogate as well as nursing, discussions with consultants, evaluation of patient's response to treatment, examination of patient, obtaining history from patient or surrogate, ordering and performing treatments and interventions, ordering and review of laboratory studies, ordering and review of radiographic studies, pulse oximetry and re-evaluation of patient's condition.   Varney Biles, MD 05/20/15 [place], MD 06/13/15 [id number]

## 2015-05-20 NOTE — ED Notes (Signed)
Istat troponin= 0.64, MD Nanavati notified

## 2015-05-20 NOTE — ED Notes (Signed)
Istat Lactic Acid=4.59, MD Nanavati notified

## 2015-05-20 NOTE — ED Notes (Signed)
Pt is alert and oriented,  Denies pain,  Vital signs stable

## 2015-05-21 LAB — URINE MICROSCOPIC-ADD ON

## 2015-05-21 LAB — URINALYSIS, ROUTINE W REFLEX MICROSCOPIC
BILIRUBIN URINE: NEGATIVE
GLUCOSE, UA: NEGATIVE mg/dL
KETONES UR: NEGATIVE mg/dL
Nitrite: NEGATIVE
PH: 5.5 (ref 5.0–8.0)
Protein, ur: 30 mg/dL — AB
Specific Gravity, Urine: 1.019 (ref 1.005–1.030)
Urobilinogen, UA: 0.2 mg/dL (ref 0.0–1.0)

## 2015-05-21 LAB — CBC
HEMATOCRIT: 29.7 % — AB (ref 39.0–52.0)
HEMOGLOBIN: 10.3 g/dL — AB (ref 13.0–17.0)
MCH: 33.9 pg (ref 26.0–34.0)
MCHC: 34.7 g/dL (ref 30.0–36.0)
MCV: 97.7 fL (ref 78.0–100.0)
Platelets: 110 10*3/uL — ABNORMAL LOW (ref 150–400)
RBC: 3.04 MIL/uL — ABNORMAL LOW (ref 4.22–5.81)
RDW: 14.4 % (ref 11.5–15.5)
WBC: 8.9 10*3/uL (ref 4.0–10.5)

## 2015-05-21 LAB — DIFFERENTIAL
BASOS PCT: 0 % (ref 0–1)
Basophils Absolute: 0 10*3/uL (ref 0.0–0.1)
EOS ABS: 0 10*3/uL (ref 0.0–0.7)
EOS PCT: 0 % (ref 0–5)
Lymphocytes Relative: 10 % — ABNORMAL LOW (ref 12–46)
Lymphs Abs: 0.9 10*3/uL (ref 0.7–4.0)
MONO ABS: 0.7 10*3/uL (ref 0.1–1.0)
MONOS PCT: 8 % (ref 3–12)
Neutro Abs: 7.3 10*3/uL (ref 1.7–7.7)
Neutrophils Relative %: 82 % — ABNORMAL HIGH (ref 43–77)

## 2015-05-21 LAB — GLUCOSE, CAPILLARY
GLUCOSE-CAPILLARY: 143 mg/dL — AB (ref 65–99)
GLUCOSE-CAPILLARY: 59 mg/dL — AB (ref 65–99)

## 2015-05-21 LAB — TROPONIN I
TROPONIN I: 0.31 ng/mL — AB (ref <0.031)
Troponin I: 0.3 ng/mL — ABNORMAL HIGH (ref <0.031)

## 2015-05-21 LAB — BASIC METABOLIC PANEL
ANION GAP: 9 (ref 5–15)
BUN: 29 mg/dL — ABNORMAL HIGH (ref 6–20)
CHLORIDE: 106 mmol/L (ref 101–111)
CO2: 20 mmol/L — AB (ref 22–32)
Calcium: 8 mg/dL — ABNORMAL LOW (ref 8.9–10.3)
Creatinine, Ser: 1.25 mg/dL — ABNORMAL HIGH (ref 0.61–1.24)
GFR calc Af Amer: 56 mL/min — ABNORMAL LOW (ref >60)
GFR, EST NON AFRICAN AMERICAN: 48 mL/min — AB (ref >60)
GLUCOSE: 208 mg/dL — AB (ref 65–99)
POTASSIUM: 3.9 mmol/L (ref 3.5–5.1)
Sodium: 135 mmol/L (ref 135–145)

## 2015-05-21 LAB — CBG MONITORING, ED: Glucose-Capillary: 223 mg/dL — ABNORMAL HIGH (ref 65–99)

## 2015-05-21 LAB — LACTIC ACID, PLASMA: Lactic Acid, Venous: 3 mmol/L (ref 0.5–2.0)

## 2015-05-21 LAB — MRSA PCR SCREENING: MRSA by PCR: NEGATIVE

## 2015-05-21 MED ORDER — FLUTICASONE PROPIONATE 50 MCG/ACT NA SUSP
2.0000 | Freq: Every day | NASAL | Status: DC
  Administered 2015-05-21 – 2015-05-24 (×4): 2 via NASAL
  Filled 2015-05-21 (×2): qty 16

## 2015-05-21 MED ORDER — SIMVASTATIN 10 MG PO TABS
10.0000 mg | ORAL_TABLET | ORAL | Status: DC
  Administered 2015-05-21 – 2015-05-23 (×2): 10 mg via ORAL
  Filled 2015-05-21 (×3): qty 1

## 2015-05-21 MED ORDER — ACETAMINOPHEN 650 MG RE SUPP: 650.0000 mg | Freq: Four times a day (QID) | RECTAL | Status: DC | PRN

## 2015-05-21 MED ORDER — SODIUM CHLORIDE 0.9 % IJ SOLN
3.0000 mL | Freq: Two times a day (BID) | INTRAMUSCULAR | Status: DC
  Administered 2015-05-21 – 2015-05-24 (×2): 3 mL via INTRAVENOUS

## 2015-05-21 MED ORDER — GABAPENTIN 100 MG PO CAPS
100.0000 mg | ORAL_CAPSULE | Freq: Every day | ORAL | Status: DC
  Administered 2015-05-21 – 2015-05-23 (×4): 100 mg via ORAL
  Filled 2015-05-21 (×4): qty 1

## 2015-05-21 MED ORDER — OXYBUTYNIN CHLORIDE 5 MG PO TABS
5.0000 mg | ORAL_TABLET | Freq: Every day | ORAL | Status: DC
  Administered 2015-05-21 – 2015-05-24 (×4): 5 mg via ORAL
  Filled 2015-05-21 (×4): qty 1

## 2015-05-21 MED ORDER — ENOXAPARIN SODIUM 30 MG/0.3ML ~~LOC~~ SOLN
30.0000 mg | SUBCUTANEOUS | Status: DC
  Administered 2015-05-21: 30 mg via SUBCUTANEOUS
  Filled 2015-05-21: qty 0.3

## 2015-05-21 MED ORDER — SERTRALINE HCL 100 MG PO TABS
100.0000 mg | ORAL_TABLET | Freq: Two times a day (BID) | ORAL | Status: DC
  Administered 2015-05-21 – 2015-05-24 (×8): 100 mg via ORAL
  Filled 2015-05-21 (×8): qty 1

## 2015-05-21 MED ORDER — ADULT MULTIVITAMIN W/MINERALS CH
1.0000 | ORAL_TABLET | Freq: Every morning | ORAL | Status: DC
  Administered 2015-05-21 – 2015-05-24 (×4): 1 via ORAL
  Filled 2015-05-21 (×4): qty 1

## 2015-05-21 MED ORDER — NITROGLYCERIN 0.4 MG SL SUBL: 0.4000 mg | SUBLINGUAL_TABLET | SUBLINGUAL | Status: DC | PRN

## 2015-05-21 MED ORDER — ENZALUTAMIDE 40 MG PO CAPS
160.0000 mg | ORAL_CAPSULE | Freq: Every day | ORAL | Status: DC
  Filled 2015-05-21: qty 4

## 2015-05-21 MED ORDER — METOPROLOL SUCCINATE 12.5 MG HALF TABLET
12.5000 mg | ORAL_TABLET | Freq: Every day | ORAL | Status: DC
  Administered 2015-05-21 – 2015-05-24 (×4): 12.5 mg via ORAL
  Filled 2015-05-21 (×4): qty 1

## 2015-05-21 MED ORDER — ONDANSETRON HCL 4 MG/2ML IJ SOLN: 4.0000 mg | Freq: Four times a day (QID) | INTRAMUSCULAR | Status: DC | PRN

## 2015-05-21 MED ORDER — CYCLOSPORINE 0.05 % OP EMUL
1.0000 [drp] | Freq: Two times a day (BID) | OPHTHALMIC | Status: DC
  Administered 2015-05-21 – 2015-05-24 (×7): 1 [drp] via OPHTHALMIC
  Filled 2015-05-21 (×9): qty 1

## 2015-05-21 MED ORDER — ACETAMINOPHEN 325 MG PO TABS: 650.0000 mg | ORAL_TABLET | Freq: Four times a day (QID) | ORAL | Status: DC | PRN

## 2015-05-21 MED ORDER — SODIUM CHLORIDE 0.9 % IV SOLN
INTRAVENOUS | Status: DC
  Administered 2015-05-21 – 2015-05-23 (×4): via INTRAVENOUS

## 2015-05-21 MED ORDER — HYDROCODONE-ACETAMINOPHEN 5-325 MG PO TABS
1.0000 | ORAL_TABLET | ORAL | Status: DC | PRN
  Administered 2015-05-22: 1 via ORAL
  Filled 2015-05-21: qty 1

## 2015-05-21 MED ORDER — AMLODIPINE BESYLATE 5 MG PO TABS
5.0000 mg | ORAL_TABLET | Freq: Every day | ORAL | Status: DC
  Administered 2015-05-21 – 2015-05-24 (×4): 5 mg via ORAL
  Filled 2015-05-21 (×4): qty 1

## 2015-05-21 MED ORDER — PANCRELIPASE (LIP-PROT-AMYL) 12000-38000 UNITS PO CPEP
2.0000 | ORAL_CAPSULE | Freq: Three times a day (TID) | ORAL | Status: DC
  Administered 2015-05-21 – 2015-05-24 (×10): 24000 [IU] via ORAL
  Filled 2015-05-21 (×12): qty 2

## 2015-05-21 MED ORDER — CALCIUM CARBONATE-VITAMIN D 500-200 MG-UNIT PO TABS
1.0000 | ORAL_TABLET | Freq: Two times a day (BID) | ORAL | Status: DC
  Administered 2015-05-21 – 2015-05-24 (×7): 1 via ORAL
  Filled 2015-05-21 (×7): qty 1

## 2015-05-21 MED ORDER — ASPIRIN EC 81 MG PO TBEC
81.0000 mg | DELAYED_RELEASE_TABLET | Freq: Every day | ORAL | Status: DC
  Administered 2015-05-21 – 2015-05-24 (×4): 81 mg via ORAL
  Filled 2015-05-21 (×4): qty 1

## 2015-05-21 MED ORDER — BICALUTAMIDE 50 MG PO TABS: 50.0000 mg | ORAL_TABLET | Freq: Every day | ORAL | Status: DC

## 2015-05-21 MED ORDER — HYDROXYZINE HCL 25 MG PO TABS: 25.0000 mg | ORAL_TABLET | Freq: Three times a day (TID) | ORAL | Status: DC | PRN

## 2015-05-21 MED ORDER — SILVER SULFADIAZINE 1 % EX CREA
1.0000 "application " | TOPICAL_CREAM | Freq: Two times a day (BID) | CUTANEOUS | Status: DC
  Administered 2015-05-21 – 2015-05-24 (×8): 1 via TOPICAL
  Filled 2015-05-21: qty 85

## 2015-05-21 MED ORDER — INSULIN ASPART 100 UNIT/ML ~~LOC~~ SOLN
3.0000 [IU] | Freq: Three times a day (TID) | SUBCUTANEOUS | Status: DC
  Administered 2015-05-21 – 2015-05-24 (×4): 3 [IU] via SUBCUTANEOUS

## 2015-05-21 MED ORDER — PANTOPRAZOLE SODIUM 40 MG PO TBEC
40.0000 mg | DELAYED_RELEASE_TABLET | Freq: Every day | ORAL | Status: DC
  Administered 2015-05-21 – 2015-05-24 (×4): 40 mg via ORAL
  Filled 2015-05-21 (×4): qty 1

## 2015-05-21 MED ORDER — ONDANSETRON HCL 4 MG PO TABS: 4.0000 mg | ORAL_TABLET | Freq: Four times a day (QID) | ORAL | Status: DC | PRN

## 2015-05-21 MED ORDER — CAMPHOR-MENTHOL 0.5-0.5 % EX LOTN
1.0000 "application " | TOPICAL_LOTION | CUTANEOUS | Status: DC | PRN
  Filled 2015-05-21: qty 222

## 2015-05-21 MED ORDER — POLYETHYLENE GLYCOL 3350 17 G PO PACK
17.0000 g | PACK | Freq: Every day | ORAL | Status: DC
  Administered 2015-05-21 – 2015-05-24 (×3): 17 g via ORAL
  Filled 2015-05-21 (×4): qty 1

## 2015-05-21 MED ORDER — INSULIN ASPART 100 UNIT/ML ~~LOC~~ SOLN
0.0000 [IU] | Freq: Three times a day (TID) | SUBCUTANEOUS | Status: DC
  Administered 2015-05-21 – 2015-05-22 (×3): 1 [IU] via SUBCUTANEOUS
  Administered 2015-05-23: 2 [IU] via SUBCUTANEOUS
  Administered 2015-05-23: 3 [IU] via SUBCUTANEOUS
  Administered 2015-05-23: 2 [IU] via SUBCUTANEOUS
  Administered 2015-05-24: 1 [IU] via SUBCUTANEOUS

## 2015-05-21 MED ORDER — ENZALUTAMIDE 40 MG PO CAPS
160.0000 mg | ORAL_CAPSULE | Freq: Every day | ORAL | Status: DC
  Administered 2015-05-21 – 2015-05-23 (×3): 160 mg via ORAL

## 2015-05-21 MED ORDER — POLYETHYLENE GLYCOL 3350 17 G PO PACK: 17.0000 g | PACK | Freq: Every day | ORAL | Status: DC | PRN

## 2015-05-21 MED ORDER — ALUM & MAG HYDROXIDE-SIMETH 200-200-20 MG/5ML PO SUSP: 30.0000 mL | Freq: Four times a day (QID) | ORAL | Status: DC | PRN

## 2015-05-21 NOTE — Progress Notes (Addendum)
KYI ROMANELLO WLN:[id number] DOB: 02-25-1920 DOA: 05/20/2015 PCP: [name], MD  Brief narrative: 81 y/o ? Dm ty ii Cad s/p stent 1996 [place]-Myoview 2008 non-iscn Metastatic prostate cancer diag 1982, Rx [place] with spinal and rib metastase-[used to be on Lupron-currently on Casodex]-followed at [facility] by Dr. Marcello Moores Prior vertebroplasties 10/12/13 L1 vertebra, Kyphoplasty 11/14/12 Ulcer L heel-not furhter re-vascularization candidate DVT 12/2012 Mod P-EM HLd  Admitted from home wher ehe lives c his wife with gen weakness, mild lactic acidosis and hypoglycemia Troponin ? [chr] But trended down Wbc 15.5 cxr neg Ct =proctitis + retained stool  Consultants:  pallaitive  Procedures:    Antibiotics:  Ceftazidime8/20  Flagyl 8/20  Vanc 8/20   Subjective    Much more alert 4 stools No n/v tol diet Tells me constipated x 5 days pta No cp No sob No dysuria Has chr foley scheduled to be changed 8/22-[facility]   Objective    Telemetry: PVc's   Objective: Filed Vitals:   05/21/15 0000 05/21/15 0131 05/21/15 0139 05/21/15 0439  BP: 119/69 129/62  118/73  Pulse: 75 68  74  Temp:  98.2 F (36.8 C)  98.4 F (36.9 C)  TempSrc:  Oral  Oral  Resp: 33 24  17  Height:   '5\' 6"'$  (1.676 m)   Weight:   57.244 kg (126 lb 3.2 oz)   SpO2: 96% 98%  97%    Intake/Output Summary (Last 24 hours) at 05/21/15 1206 Last data filed at 05/20/15 2214  Gross per 24 hour  Intake   1000 ml  Output      0 ml  Net   1000 ml    Exam:  General: eomi, frail bi temp wasting, no jed, no bruit Cardiovascular: s1 s 2no m/r/g Respiratory: clear no added sound Abdomen:  Soft but slightly tender bilaterally in lower quad Skin no le edema Neuro intact grossly  Data Reviewed: Basic Metabolic Panel:  Recent Labs Lab 05/20/15 2011 05/20/15 2118 05/21/15 0155  NA 137 139 135  K 3.8 4.0 3.9  CL 106 104 106  CO2 20*  --  20*    GLUCOSE 62* 51* 208*  BUN 33* 32* 29*  CREATININE 1.36* 1.30* 1.25*  CALCIUM 9.2  --  8.0*   Liver Function Tests:  Recent Labs Lab 05/20/15 2011  AST 41  ALT 16*  ALKPHOS 69  BILITOT 1.4*  PROT 7.2  ALBUMIN 3.2*    Recent Labs Lab 05/20/15 2011  LIPASE 54*   No results for input(s): AMMONIA in the last 168 hours. CBC:  Recent Labs Lab 05/20/15 2011 05/20/15 2118 05/21/15 0155  WBC 15.5*  --  8.9  NEUTROABS  --   --  7.3  HGB 12.8* 13.3 10.3*  HCT 37.3* 39.0 29.7*  MCV 98.2  --  97.7  PLT 190  --  110*   Cardiac Enzymes:  Recent Labs Lab 05/20/15 2022 05/20/15 2309 05/21/15 0155 05/21/15 0756  TROPONINI 0.26* 0.28* 0.30* 0.31*   BNP: Invalid input(s): POCBNP CBG:  Recent Labs Lab 05/20/15 2000 05/20/15 2224 05/21/15 0047  GLUCAP 60* 212* 223*    Recent Results (from the past 240 hour(s))  MRSA PCR Screening     Status: None   Collection Time: 05/21/15  1:50 AM  Result Value Ref Range Status   MRSA by PCR NEGATIVE NEGATIVE Final    Comment:        The GeneXpert MRSA Assay (FDA  approved for NASAL specimens only), is one component of a comprehensive MRSA colonization surveillance program. It is not intended to diagnose MRSA infection nor to guide or monitor treatment for MRSA infections.      Studies:              All Imaging reviewed and is as per above notation   Scheduled Meds: . amLODipine  5 mg Oral Daily  . aspirin EC  81 mg Oral Daily  . calcium-vitamin D  1 tablet Oral BID  . cefTAZidime (FORTAZ)  IV  2 g Intravenous Q24H  . cycloSPORINE  1 drop Both Eyes BID  . enoxaparin (LOVENOX) injection  30 mg Subcutaneous Q24H  . enzalutamide  160 mg Oral Daily  . fluticasone  2 spray Each Nare Daily  . gabapentin  100 mg Oral QHS  . lipase/protease/amylase  2 capsule Oral TID  . metoprolol succinate  12.5 mg Oral Daily  . multivitamin with minerals  1 tablet Oral q morning - 10a  . oxybutynin  5 mg Oral Daily  . pantoprazole   40 mg Oral Daily  . polyethylene glycol  17 g Oral Daily  . sertraline  100 mg Oral BID  . silver sulfADIAZINE  1 application Topical BID  . simvastatin  10 mg Oral QODAY  . sodium chloride  3 mL Intravenous Q12H   Continuous Infusions: . sodium chloride 100 mL/hr at 05/21/15 0630     Assessment/Plan:   Generalized weakness, multifactorial -2/2 advanced prostate cancer + dehydration and hypoglycemia which are likely contributory to presenting complaints.  -lactic acid is elevated-could be type "b" lactic acidosis as can be seen with Polyethylene glycol/metfromin etc etc -the patient is relatively nontoxic appearing. His chest x-ray is clear.  -Proctitis is likely related to constipation and having had an enema on the date of admission.  -Blood and urine cultures pending from 8/20 -d/c abx 8/22 and reasess off abx his fever curve -recent start on ?Amitrys so unclear if this might have caused some    Abdominal pain/diarrhea/proctitis - Would avoid checking for C. difficile given that he was treated with an enema yesterday which precipitated the diarrhea. He has otherwise been fairly constipated. -CT of abdomen/pelvis only showed proctitis, likely from having had an enema, with stool above the inflamed rectum. -Would start on a bowel regimen with reg miralax   Prostate cancer metastatic to multiple sites -Followed by urology/oncology at [facility]  -Continue Xtandi/Casodex   Dehydration / acute kidney injury superimposed on CKD stgIII -Hydrate and monitor kidney function. Has a chronic indwelling Foley.  -No hydronephrosis noted on CT of the abdomen/pelvis. -Current creatinine slightly elevated over usual baseline values (baseline creatinine 1.2, current creatinine 1.3).    Leukocytosis/lactic acidosis  -May be reflective of a possible infection versus hemoconcentration.  -Was started on Fortaz in the ED-see above disucssion -needs UA from new Cathter  placed 8/21 -would still d/c abc in am 8/22   Elevated troponin with h/o CAD s/p stent 1996-last myoview 2008 wnl - Continue aspirin. - Cycle troponins.  -cont metoprlol xl 12.5 c daily, amlodipine 5 osd -No further work up as troponin's have decreased   Hypoglycemia / Type 2 diabetes mellitus without complication -Resolved with dextrose infusion given in the ED. -Hold LA lantus 16 U insulin for now [can resume dependant on trends of CBg on 8/22] -use only SSI  Severe P-EM Poor dentition Cannot be fed as not candidate for dentures Get Nutritionist involved  DVT prophylaxis - Lovenox ordered.   Code Status: DNR  Family Communication: called wife [name] updated fully Disposition Plan: home DVT prophylaxis: SCD Consultants:   Verneita Griffes, MD  [facility] Pager [phone] 05/21/2015, 12:06 PM    LOS: 1 day

## 2015-05-21 NOTE — Progress Notes (Signed)
CRITICAL VALUE ALERT  Critical value received: Lactic acid 3.0  Date of notification:  05/21/15  Time of notification:  0232  Critical value read back:Yes.    Nurse who received alert:  J.Laiza Veenstra  MD notified (1st page): L.Durand N.P  Time of first page:  0232  MD notified (2nd page):  Time of second page:  Responding MD:  N/A  Time MD responded:  N/A

## 2015-05-22 ENCOUNTER — Other Ambulatory Visit (HOSPITAL_COMMUNITY): Payer: Medicare Other

## 2015-05-22 DIAGNOSIS — D72829 Elevated white blood cell count, unspecified: Secondary | ICD-10-CM

## 2015-05-22 DIAGNOSIS — E118 Type 2 diabetes mellitus with unspecified complications: Secondary | ICD-10-CM

## 2015-05-22 DIAGNOSIS — E860 Dehydration: Secondary | ICD-10-CM

## 2015-05-22 DIAGNOSIS — R7989 Other specified abnormal findings of blood chemistry: Secondary | ICD-10-CM

## 2015-05-22 DIAGNOSIS — R531 Weakness: Secondary | ICD-10-CM

## 2015-05-22 DIAGNOSIS — E162 Hypoglycemia, unspecified: Secondary | ICD-10-CM

## 2015-05-22 DIAGNOSIS — K6289 Other specified diseases of anus and rectum: Secondary | ICD-10-CM

## 2015-05-22 LAB — BASIC METABOLIC PANEL
Anion gap: 6 (ref 5–15)
BUN: 23 mg/dL — ABNORMAL HIGH (ref 6–20)
CALCIUM: 7.9 mg/dL — AB (ref 8.9–10.3)
CHLORIDE: 110 mmol/L (ref 101–111)
CO2: 20 mmol/L — AB (ref 22–32)
CREATININE: 1.15 mg/dL (ref 0.61–1.24)
GFR calc non Af Amer: 53 mL/min — ABNORMAL LOW (ref >60)
GLUCOSE: 96 mg/dL (ref 65–99)
Potassium: 3.5 mmol/L (ref 3.5–5.1)
Sodium: 136 mmol/L (ref 135–145)

## 2015-05-22 LAB — CBC WITH DIFFERENTIAL/PLATELET
BASOS PCT: 0 % (ref 0–1)
Basophils Absolute: 0 10*3/uL (ref 0.0–0.1)
EOS ABS: 0.4 10*3/uL (ref 0.0–0.7)
Eosinophils Relative: 5 % (ref 0–5)
HEMATOCRIT: 30.7 % — AB (ref 39.0–52.0)
Hemoglobin: 10.7 g/dL — ABNORMAL LOW (ref 13.0–17.0)
Lymphocytes Relative: 15 % (ref 12–46)
Lymphs Abs: 1.2 10*3/uL (ref 0.7–4.0)
MCH: 34.4 pg — AB (ref 26.0–34.0)
MCHC: 34.9 g/dL (ref 30.0–36.0)
MCV: 98.7 fL (ref 78.0–100.0)
MONO ABS: 0.8 10*3/uL (ref 0.1–1.0)
MONOS PCT: 9 % (ref 3–12)
Neutro Abs: 5.8 10*3/uL (ref 1.7–7.7)
Neutrophils Relative %: 71 % (ref 43–77)
Platelets: 103 10*3/uL — ABNORMAL LOW (ref 150–400)
RBC: 3.11 MIL/uL — ABNORMAL LOW (ref 4.22–5.81)
RDW: 14.4 % (ref 11.5–15.5)
WBC: 8.2 10*3/uL (ref 4.0–10.5)

## 2015-05-22 LAB — GLUCOSE, CAPILLARY
GLUCOSE-CAPILLARY: 79 mg/dL (ref 65–99)
GLUCOSE-CAPILLARY: 138 mg/dL — AB (ref 65–99)
GLUCOSE-CAPILLARY: 67 mg/dL (ref 65–99)
Glucose-Capillary: 133 mg/dL — ABNORMAL HIGH (ref 65–99)
Glucose-Capillary: 76 mg/dL (ref 65–99)

## 2015-05-22 LAB — LACTIC ACID, PLASMA: Lactic Acid, Venous: 0.9 mmol/L (ref 0.5–2.0)

## 2015-05-22 MED ORDER — ENSURE ENLIVE PO LIQD
237.0000 mL | Freq: Two times a day (BID) | ORAL | Status: DC
  Administered 2015-05-22 – 2015-05-24 (×4): 237 mL via ORAL

## 2015-05-22 MED ORDER — ENOXAPARIN SODIUM 40 MG/0.4ML ~~LOC~~ SOLN
40.0000 mg | SUBCUTANEOUS | Status: DC
  Administered 2015-05-22 – 2015-05-24 (×3): 40 mg via SUBCUTANEOUS
  Filled 2015-05-22 (×4): qty 0.4

## 2015-05-22 NOTE — Progress Notes (Signed)
Inpatient Diabetes Program Recommendations  AACE/ADA: New Consensus Statement on Inpatient Glycemic Control (2013)  Target Ranges:  Prepandial:   less than 140 mg/dL      Peak postprandial:   less than 180 mg/dL (1-2 hours)      Critically ill patients:  140 - 180 mg/dL   Reason for Visit: Hypoglycemia  Results for Guy [name], Guy [name] (MRN [id number]) as of 05/22/2015 11:22  Ref. Range 05/20/2015 20:00 05/20/2015 22:24 05/21/2015 00:47 05/21/2015 16:28 05/21/2015 20:59 05/22/2015 07:55  Glucose-Capillary Latest Ref Range: 65-99 mg/dL 60 (L) 212 (H) 223 (H) 143 (H) 59 (L) 79    Hypoglycemia after meal coverage insulin.  Recommendations: D/C Novolog 3 units tidwc. May benefit from Lantus 5 units QHS.    Will follow. Thank you. Lorenda Peck, RD, LDN, CDE Inpatient Diabetes Coordinator [phone]

## 2015-05-22 NOTE — Progress Notes (Signed)
Chaplain visited patient during rounds. Chaplain provided a listening presence as well as support. Patient spoke of different things and chaplain listened. Patient appeared thankful for the visit. Chaplain services are available.   05/22/15 1100  Clinical Encounter Type  Visited With Patient  Visit Type Initial;Spiritual support;Social support  Referral From Palliative care team  Consult/Referral To Chaplain

## 2015-05-22 NOTE — Progress Notes (Signed)
Initial Nutrition Assessment  DOCUMENTATION CODES:   Severe malnutrition in context of chronic illness  INTERVENTION:  - Will order Ensure Enlive po BID, each supplement provides 350 kcal and 20 grams of protein (this supplement is lactose-free). - RD will continue to monitor for needs  NUTRITION DIAGNOSIS:   Increased nutrient needs related to catabolic illness, cancer and cancer related treatments as evidenced by estimated needs.  GOAL:   Patient will meet greater than or equal to 90% of their needs  MONITOR:   PO intake, Supplement acceptance, Weight trends, Labs, I & O's  REASON FOR ASSESSMENT:   Consult Assessment of nutrition requirement/status  ASSESSMENT:   81 y.o. male with a PMH of hypertension, diabetes mellitus, GERD, history of stroke, metastatic prostate cancer to the lungs and ribs (follows at [facility]), CAD with history of cardiac cath in 2004 and recent negative stress test, recent hospitalization for treatment of sepsis thought to be from UTI (but urine cultures negative), who was brought into the hospital with weakness, chills, abdominal discomfort, and diarrhea. The patient tells me he had not had a bowel movement in several days, then was given an enema yesterday and subsequently had some loose stools thereafter.  Pt seen for consult. BMI indicates normal weight status. Pt ate 10% lunch and 65% dinner yesterday per chart review and 25% of breakfast this AM which pt reports consisted of scrambled eggs and oatmeal.  Difficulty hearing pt throughout discussion. He often talks about other topics. He does state that he was drinking shakes such as Ensure or Boost PTA and is interested in receiving them during admission. Will order Ensure and switch to Glucerna Shake if needed given CBGs; Ensure Enlive has twice the amount of protein as Glucerna Shake.  Weight hx review shows 9 lb weight gain over the past 13 months. Severe muscle and fat wasting noted. Unable to  determine if pt is fully meeting needs. Medications reviewed. Labs reviewed; CBGs: 59-223 mg/dL, BUN elevated, Ca: 7.9 mg/dL, GFR: 53.  Diet Order:  Diet Carb Modified Fluid consistency:: Thin; Room service appropriate?: Yes  Skin:  Wound (see comment) (ecchymosis to multiple areas)  Last BM:  8/22  Height:   Ht Readings from Last 1 Encounters:  05/21/15 '5\' 6"'$  (1.676 m)    Weight:   Wt Readings from Last 1 Encounters:  05/21/15 126 lb 3.2 oz (57.244 kg)    Ideal Body Weight:  64.54 kg (kg)  BMI:  Body mass index is 20.38 kg/(m^2).  Estimated Nutritional Needs:   Kcal:  1157-2620  Protein:  70-80 grams  Fluid:  2.2 L/day  EDUCATION NEEDS:   No education needs identified at this time     Jarome Matin, RD, LDN Inpatient Clinical Dietitian Pager # [phone] After hours/weekend pager # [phone]

## 2015-05-22 NOTE — Care Management Note (Signed)
Case Management Note  Patient Details  Name: Guy [name] MRN: [id number] Date of Birth: 09/29/1920  Subjective/Objective:          81 yo admitted with Generalized weakness          Action/Plan: From home with wife  Expected Discharge Date:                  Expected Discharge Plan:  [place]  In-House Referral:     Discharge planning Services  CM Consult  Post Acute Care Choice:  Home Health Choice offered to:  Spouse  DME Arranged:    DME Agency:     HH Arranged:  PT, RN, NA HH Agency:  [place]  Status of Service:  In process, will continue to follow  Medicare Important Message Given:    Date Medicare IM Given:    Medicare IM give by:    Date Additional Medicare IM Given:    Additional Medicare Important Message give by:     If discussed at [place] of Stay Meetings, dates discussed:    Additional Comments: SW and I met with pt and wife at bedside. Wife states she would like to take pt back home and they have used [facility] in the past for [facility] services and would like to use them again. Referral called to [facility] rep. CM will continue to follow. Lynnell Catalan, RN 05/22/2015, 3:37 PM

## 2015-05-22 NOTE — Evaluation (Signed)
Physical Therapy Evaluation Patient Details Name: Guy [name] MRN: [id number] DOB: [birth year] Today's Date: 05/22/2015   History of Present Illness  Guy [name] is an 81 y.o. 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 The patient tells me he had not had a bowel movement in several days, then was given an enema yesterday and subsequently had some loose stools thereafter  Clinical Impression  Patient  Tolerated well, no family present to get info. Patient will benefit from PT to address problems below.    Follow Up Recommendations SNF;Home health PT;Supervision/Assistance - 24 hour (depends on caregivers.)    Equipment Recommendations  None recommended by PT    Recommendations for Other Services       Precautions / Restrictions Precautions Precautions: Fall      Mobility  Bed Mobility Overal bed mobility: Needs Assistance Bed Mobility: Supine to Sit     Supine to sit: Min assist     General bed mobility comments: cues for safety  Transfers Overall transfer level: Needs assistance Equipment used: Rolling walker (2 wheeled) Transfers: Sit to/from [facility] Sit to Stand: Min assist Stand pivot transfers: Min assist       General transfer comment: cues for safety, stood for periwash.  Ambulation/Gait                Stairs            Wheelchair Mobility    Modified Rankin (Stroke Patients Only)       Balance Overall balance assessment: Needs assistance;History of Falls Sitting-balance support: Bilateral upper extremity supported;Feet supported Sitting  balance-Leahy Scale: Fair     Standing balance support: During functional activity;Bilateral upper extremity supported Standing balance-Leahy Scale: Poor                               Pertinent Vitals/Pain Pain Assessment: No/denies pain    Home Living Family/patient expects to be discharged to:: Private residence Living Arrangements: Spouse/significant other Available Help at Discharge: Family Type of Home: House Home Access: Ramped entrance     Home Layout: One level   Additional Comments: no family present     Prior Function           Comments: no family present, patient unable.     Hand Dominance        Extremity/Trunk Assessment   Upper Extremity Assessment: Generalized weakness           Lower Extremity Assessment: Generalized weakness      Cervical / Trunk Assessment: Kyphotic  Communication   Communication:  (speech is mumbling, difficult to understand.)  Cognition Arousal/Alertness: Awake/alert Behavior During Therapy: WFL for tasks assessed/performed Overall Cognitive Status: No family/caregiver present to determine baseline cognitive functioning                      General Comments      Exercises        Assessment/Plan    PT Assessment Patient needs continued PT services  PT Diagnosis Difficulty walking;Generalized weakness   PT Problem List Decreased strength;Decreased activity tolerance;Decreased balance;Decreased  knowledge of use of DME;Decreased safety awareness;Decreased knowledge of precautions;Decreased skin integrity  PT Treatment Interventions DME instruction;Gait training;Functional mobility training;Therapeutic activities;Patient/family education   PT Goals (Current goals can be found in the Care Plan section) Acute Rehab PT Goals PT Goal Formulation: Patient unable to participate in goal setting Time For Goal Achievement: 06/05/15 Potential to Achieve Goals: Good    Frequency Min 3X/week    Barriers to discharge   uncertain of caregivers.    Co-evaluation               End of Session   Activity Tolerance: Patient tolerated treatment well Patient left: in chair;with call bell/phone within reach;with chair alarm set Nurse Communication: Mobility status         Time: 2751-7001 PT Time Calculation (min) (ACUTE ONLY): 25 min   Charges:   PT Evaluation $Initial PT Evaluation Tier I: 1 Procedure PT Treatments $Therapeutic Activity: 8-22 mins   PT G Codes:        Guy [name] 05/22/2015, 1:39 PM

## 2015-05-22 NOTE — Progress Notes (Signed)
[facility]                                                                              Patient Demographics  Guy [name], is a 81 y.o. male, DOB - July 06, 1920, YQM:[id number]  Admit date - 05/20/2015   Admitting Physician Venetia Maxon Rama, MD  Outpatient Primary MD for the patient is Guy Patricia, MD  LOS - 2   Chief Complaint  Patient presents with  . Weakness       Brief HPI   Patient is a 81 year old male with a PMH of hypertension, diabetes mellitus, GERD, history of stroke, metastatic prostate cancer to the lungs and ribs (follows at [facility]), CAD with history of cardiac cath in 2004 and recent negative stress test, recent hospitalization for treatment of sepsis thought to be from UTI (but urine cultures negative), who was brought into the hospital with weakness, chills, abdominal discomfort, and diarrhea. Patient was found to have mild lactic acidosis, hypoglycemia, elevated troponins. WBC 15.5, chest x-ray was negative, CT of the abdomen showed proctitis with retained stool.   Assessment & Plan    Principal Problem: Generalized weakness, multifactorial; in the setting of advanced prostate cancer, dehydration, lactic acidosis and hypoglycemia - Chest x-ray clear, proctitis could be due to constipation, follow blood cultures and urine cultures so far negative - recent start on ?Amitrys so unclear if this might have caused some  - Lactic acid normalized after IV fluid hydration   Abdominal pain/diarrhea/proctitis - CT of abdomen/pelvis only showed proctitis, likely from having had an enema, with stool above the inflamed rectum. -Continue bowel regimen with reg miralax   Prostate cancer metastatic to multiple sites -Followed by urology/oncology at [facility]   Dehydration / acute kidney injury superimposed on CKD stgIII -Lactic acidosis improved after hydration. Has a chronic indwelling Foley.  -No  hydronephrosis noted on CT of the abdomen/pelvis.   Leukocytosis/lactic acidosis  -May be reflective of a possible infection versus hemoconcentration. Lactic acidosis could be due to metformin. - Lactic acidosis resolved, follow urine culture   Elevated troponin with h/o CAD s/p stent 1996-last myoview 2008 wnl - Continue aspirin, beta blocker, Norvasc. - Follow 2-D echocardiogram    Hypoglycemia / Type 2 diabetes mellitus without complication Resolved with dextrose, continue sliding scale insulin  Severe P-EM Poor dentition  Code Status: DO NOT RESUSCITATE  Family Communication: Discussed in detail with the patient, all imaging results, lab results explained to the patient    Disposition Plan: Hopefully DC in a.m.  Time Spent in minutes   25 minutes  Procedures  CT abdomen  Consults   None  DVT Prophylaxis  Lovenox  Medications  Scheduled Meds: . amLODipine  5 mg Oral Daily  . aspirin EC  81 mg Oral Daily  . calcium-vitamin D  1 tablet Oral BID  . cefTAZidime (FORTAZ)  IV  2 g Intravenous Q24H  . cycloSPORINE  1 drop Both Eyes BID  . enoxaparin (LOVENOX) injection  40 mg Subcutaneous Q24H  . enzalutamide  160 mg Oral Q2000  . feeding supplement (ENSURE ENLIVE)  237 mL Oral BID BM  .  fluticasone  2 spray Each Nare Daily  . gabapentin  100 mg Oral QHS  . insulin aspart  0-9 Units Subcutaneous TID WC  . insulin aspart  3 Units Subcutaneous TID WC  . lipase/protease/amylase  2 capsule Oral TID  . metoprolol succinate  12.5 mg Oral Daily  . multivitamin with minerals  1 tablet Oral q morning - 10a  . oxybutynin  5 mg Oral Daily  . pantoprazole  40 mg Oral Daily  . polyethylene glycol  17 g Oral Daily  . sertraline  100 mg Oral BID  . silver sulfADIAZINE  1 application Topical BID  . simvastatin  10 mg Oral QODAY  . sodium chloride  3 mL Intravenous Q12H   Continuous Infusions: . sodium chloride 50 mL/hr at 05/21/15 1655   PRN Meds:.acetaminophen **OR**  acetaminophen, alum & mag hydroxide-simeth, camphor-menthol, HYDROcodone-acetaminophen, hydrOXYzine, nitroGLYCERIN, ondansetron **OR** ondansetron (ZOFRAN) IV   Antibiotics   Anti-infectives    Start     Dose/Rate Route Frequency Ordered Stop   05/20/15 2100  cefTAZidime (FORTAZ) 2 g in dextrose 5 % 50 mL IVPB     2 g 100 mL/hr over 30 Minutes Intravenous Every 24 hours 05/20/15 2043     05/20/15 2100  vancomycin (VANCOCIN) IVPB 1000 mg/200 mL premix     1,000 mg 200 mL/hr over 60 Minutes Intravenous  Once 05/20/15 2054 05/20/15 2249   05/20/15 2045  metroNIDAZOLE (FLAGYL) IVPB 500 mg     500 mg 100 mL/hr over 60 Minutes Intravenous  Once 05/20/15 2043 05/20/15 2140        Subjective:   Guy [name] was seen and examined today.  Feels a whole lot better today, no fevers Patient denies dizziness, chest pain, shortness of breath, abdominal pain, N/V/D/C, new weakness, numbess, tingling. No acute events overnight.    Objective:   Blood pressure 120/56, pulse 71, temperature 97.5 F (36.4 C), temperature source Oral, resp. rate 18, height '5\' 6"'$  (1.676 m), weight 57.244 kg (126 lb 3.2 oz), SpO2 95 %.  Wt Readings from Last 3 Encounters:  05/21/15 57.244 kg (126 lb 3.2 oz)  03/14/15 90.583 kg (199 lb 11.2 oz)  12/18/14 55.055 kg (121 lb 6 oz)     Intake/Output Summary (Last 24 hours) at 05/22/15 1243 Last data filed at 05/22/15 0626  Gross per 24 hour  Intake 2600.83 ml  Output   1300 ml  Net 1300.83 ml    Exam  General: Alert and oriented x 3, NAD, frail ill-appearing  HEENT:  PERRLA, EOMI, Anicteric Sclera, mucous membranes moist.   Neck: Supple, no JVD, no masses  CVS: S1 S2 auscultated, no rubs, murmurs or gallops. Regular rate and rhythm.  Respiratory: Clear to auscultation bilaterally, no wheezing, rales or rhonchi  Abdomen: Soft, mildly tender in the lower quadrants, nondistended, + bowel sounds  Ext: no cyanosis clubbing or edema  Neuro: AAOx3, Cr N's  II- XII. Strength 5/5 upper and lower extremities bilaterally  Skin: No rashes  Psych: Normal affect and demeanor, alert and oriented x3    Data Review   Micro Results Recent Results (from the past 240 hour(s))  Blood Culture (routine x 2)     Status: None (Preliminary result)   Collection Time: 05/20/15  8:15 PM  Result Value Ref Range Status   Specimen Description BLOOD LEFT FOREARM  Final   Special Requests BOTTLES DRAWN AEROBIC AND ANAEROBIC 5CC  Final   Culture   Final    NO GROWTH  1 DAY Performed at [facility]    Report Status PENDING  Incomplete  Blood Culture (routine x 2)     Status: None (Preliminary result)   Collection Time: 05/20/15  9:10 PM  Result Value Ref Range Status   Specimen Description BLOOD RIGHT FOREARM  Final   Special Requests BOTTLES DRAWN AEROBIC AND ANAEROBIC 4CC  Final   Culture   Final    NO GROWTH 1 DAY Performed at [facility]    Report Status PENDING  Incomplete  MRSA PCR Screening     Status: None   Collection Time: 05/21/15  1:50 AM  Result Value Ref Range Status   MRSA by PCR NEGATIVE NEGATIVE Final    Comment:        The GeneXpert MRSA Assay (FDA approved for NASAL specimens only), is one component of a comprehensive MRSA colonization surveillance program. It is not intended to diagnose MRSA infection nor to guide or monitor treatment for MRSA infections.     Radiology Reports Ct Abdomen Pelvis Wo Contrast  05/20/2015   CLINICAL DATA:  Headache and diarrhea.  EXAM: CT ABDOMEN AND PELVIS WITHOUT CONTRAST  TECHNIQUE: Multidetector CT imaging of the abdomen and pelvis was performed following the standard protocol without IV contrast.  COMPARISON:  09/05/2005  FINDINGS: BODY WALL: Mild gynecomastia asymmetric to the left.  Mild anasarca.  LOWER CHEST: Gastroesophageal reflux. Small left pleural effusion with dependent atelectasis.  ABDOMEN/PELVIS:  Liver: No focal abnormality.  Biliary: Cholecystectomy with mild  enlargement of the common bile duct, likely reservoir effect.  Pancreas: No acute finding.  Spleen: Unremarkable.  Adrenals: Unremarkable.  Kidneys and ureters: Bilateral renal cortical cysts. Bilateral renal atrophy without hydronephrosis. Punctate bilateral renal calcifications could be arterial or urinary.  Bladder: Decompressed by a Foley catheter.  Reproductive: Prostatectomy and pelvic lymphadenectomy. No evidence of pelvic adenopathy. There is a penile prosthesis with deflated reservoir ventrally.  Bowel: Circumferential thickening of the rectum with extensive mesorectal edema. There is prominent colonic stool and gas above the inflammation. No indication of previous radiation therapy. There is stool within the inflamed rectum but not as large as usually seen with stercoral colitis.  Retroperitoneum: Pelvic lymphadenectomy.  No evidence of adenopathy.  Peritoneum: No ascites or pneumoperitoneum.  Vascular: No acute abnormality.  OSSEOUS: Remote L1 and L3 compression fractures status post vertebroplasty. Bilateral sacroplasty. Advanced lumbar degenerative disc disease with dextroscoliosis. Profound osteopenia.  IMPRESSION: 1. Proctitis. 2. Moderate retention of stool and gas above the inflamed rectum. 3. Chronic findings are noted above.   Electronically Signed   By: Monte Fantasia M.D.   On: 05/20/2015 22:47   Ct Head Wo Contrast  05/20/2015   CLINICAL DATA:  Headache.  EXAM: CT HEAD WITHOUT CONTRAST  TECHNIQUE: Contiguous axial images were obtained from the base of the skull through the vertex without intravenous contrast.  COMPARISON:  04/02/2014  FINDINGS: Skull and Sinuses:  No acute fracture or destructive process.  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  Chronic right maxillary sinusitis containing partly calcified debris, stable from 2015. No surrounding fat inflammation.  Orbits: Bilateral cataract resection.  Brain: No evidence of acute infarction, hemorrhage, hydrocephalus, or  mass lesion/mass effect.  Generalized cortical atrophy which is stable from 2015. Extensive chronic small vessel disease with gliosis throughout the bilateral cerebral white matter and bilateral deep gray nuclei, left corona radiata, and bilateral cerebellar small vessel infarcts. Further indistinct appearance of the basal ganglia is likely from dilated perivascular spaces.  IMPRESSION: 1. No acute finding. 2. Brain atrophy and extensive chronic small vessel disease.   Electronically Signed   By: Monte Fantasia M.D.   On: 05/20/2015 22:52   Dg Chest Port 1 View  05/20/2015   CLINICAL DATA:  History hypertension and COPD. Status post fall today.  EXAM: PORTABLE CHEST - 1 VIEW  COMPARISON:  April 02, 2014  FINDINGS: The heart size and mediastinal contours are stable. The heart size is enlarged. There is no focal infiltrate, pulmonary edema, or pleural effusion. Chronic mild opacity over the left mid lung possibly calcified pleural plaques are unchanged. Chronic deformities of the right humerus, left clavicle, multiple left ribs are unchanged.  IMPRESSION: No active cardiopulmonary disease.  Stable chronic changes.   Electronically Signed   By: Abelardo Diesel M.D.   On: 05/20/2015 21:24    CBC  Recent Labs Lab 05/20/15 2011 05/20/15 2118 05/21/15 0155 05/22/15 0439  WBC 15.5*  --  8.9 8.2  HGB 12.8* 13.3 10.3* 10.7*  HCT 37.3* 39.0 29.7* 30.7*  PLT 190  --  110* 103*  MCV 98.2  --  97.7 98.7  MCH 33.7  --  33.9 34.4*  MCHC 34.3  --  34.7 34.9  RDW 14.3  --  14.4 14.4  LYMPHSABS  --   --  0.9 1.2  MONOABS  --   --  0.7 0.8  EOSABS  --   --  0.0 0.4  BASOSABS  --   --  0.0 0.0    Chemistries   Recent Labs Lab 05/20/15 2011 05/20/15 2118 05/21/15 0155 05/22/15 0617  NA 137 139 135 136  K 3.8 4.0 3.9 3.5  CL 106 104 106 110  CO2 20*  --  20* 20*  GLUCOSE 62* 51* 208* 96  BUN 33* 32* 29* 23*  CREATININE 1.36* 1.30* 1.25* 1.15  CALCIUM 9.2  --  8.0* 7.9*  AST 41  --   --   --   ALT  16*  --   --   --   ALKPHOS 69  --   --   --   BILITOT 1.4*  --   --   --    ------------------------------------------------------------------------------------------------------------------ estimated creatinine clearance is 33.2 mL/min (by C-G formula based on Cr of 1.15). ------------------------------------------------------------------------------------------------------------------ No results for input(s): HGBA1C in the last 72 hours. ------------------------------------------------------------------------------------------------------------------ No results for input(s): CHOL, HDL, LDLCALC, TRIG, CHOLHDL, LDLDIRECT in the last 72 hours. ------------------------------------------------------------------------------------------------------------------ No results for input(s): TSH, T4TOTAL, T3FREE, THYROIDAB in the last 72 hours.  Invalid input(s): FREET3 ------------------------------------------------------------------------------------------------------------------ No results for input(s): VITAMINB12, FOLATE, FERRITIN, TIBC, IRON, RETICCTPCT in the last 72 hours.  Coagulation profile No results for input(s): INR, PROTIME in the last 168 hours.  No results for input(s): DDIMER in the last 72 hours.  Cardiac Enzymes  Recent Labs Lab 05/20/15 2309 05/21/15 0155 05/21/15 0756  TROPONINI 0.28* 0.30* 0.31*   ------------------------------------------------------------------------------------------------------------------ Invalid input(s): POCBNP   Recent Labs  05/20/15 2224 05/21/15 0047 05/21/15 1628 05/21/15 2059 05/22/15 0755 05/22/15 1158  GLUCAP 212* 223* 143* 75* 34 138*     RAI,RIPUDEEP M.D. [facility] 05/22/2015, 12:43 PM  Pager: [phone] Between 7am to 7pm - call Pager - [phone]  After 7pm go to [email or web address] - password [email or web address]  Call night coverage person covering after 7pm

## 2015-05-22 NOTE — Progress Notes (Signed)
CSW received notification from RN that pt wife at bedside and requesting to speak with CSW and RNCM.  CSW and RNCM met with pt wife at bedside. Pt wife discussed that she wishes for pt to return home with home health services through [facility]. Pt does not wish for pt to go to SNF facility as she feels that she can provide pt care at home and feels that pt receives better care at home.   No social work needs identified.   CSW signing off.   Please consult if social work needs arise.  Alison Murray, MSW, [place] [facility] [phone]

## 2015-05-22 NOTE — Progress Notes (Signed)
Hypoglycemic Event  CBG: 67  Treatment: 15 GM carbohydrate snack  Symptoms: None  Follow-up CBG: Time:2230 CBG Result:76  Possible Reasons for Event: Unknown  Comments/MD notified:Schorr    Guy [name]  Remember to initiate Hypoglycemia Order Set & complete

## 2015-05-23 ENCOUNTER — Inpatient Hospital Stay (HOSPITAL_COMMUNITY): Payer: Medicare Other

## 2015-05-23 DIAGNOSIS — E43 Unspecified severe protein-calorie malnutrition: Secondary | ICD-10-CM | POA: Insufficient documentation

## 2015-05-23 DIAGNOSIS — I2510 Atherosclerotic heart disease of native coronary artery without angina pectoris: Secondary | ICD-10-CM

## 2015-05-23 LAB — CBC
HCT: 29.6 % — ABNORMAL LOW (ref 39.0–52.0)
Hemoglobin: 10.4 g/dL — ABNORMAL LOW (ref 13.0–17.0)
MCH: 34.4 pg — AB (ref 26.0–34.0)
MCHC: 35.1 g/dL (ref 30.0–36.0)
MCV: 98 fL (ref 78.0–100.0)
PLATELETS: 110 10*3/uL — AB (ref 150–400)
RBC: 3.02 MIL/uL — ABNORMAL LOW (ref 4.22–5.81)
RDW: 14.4 % (ref 11.5–15.5)
WBC: 6.2 10*3/uL (ref 4.0–10.5)

## 2015-05-23 LAB — BASIC METABOLIC PANEL
Anion gap: 8 (ref 5–15)
BUN: 22 mg/dL — AB (ref 6–20)
CALCIUM: 8 mg/dL — AB (ref 8.9–10.3)
CHLORIDE: 111 mmol/L (ref 101–111)
CO2: 19 mmol/L — AB (ref 22–32)
CREATININE: 1.14 mg/dL (ref 0.61–1.24)
GFR calc Af Amer: >60 mL/min (ref >60)
GFR calc non Af Amer: 54 mL/min — ABNORMAL LOW (ref >60)
Glucose, Bld: 226 mg/dL — ABNORMAL HIGH (ref 65–99)
Potassium: 3.8 mmol/L (ref 3.5–5.1)
SODIUM: 138 mmol/L (ref 135–145)

## 2015-05-23 LAB — GLUCOSE, CAPILLARY
GLUCOSE-CAPILLARY: 201 mg/dL — AB (ref 65–99)
Glucose-Capillary: 167 mg/dL — ABNORMAL HIGH (ref 65–99)
Glucose-Capillary: 163 mg/dL — ABNORMAL HIGH (ref 65–99)
Glucose-Capillary: 222 mg/dL — ABNORMAL HIGH (ref 65–99)

## 2015-05-23 MED ORDER — DEXTROSE 5 % IV SOLN
1.0000 g | Freq: Two times a day (BID) | INTRAVENOUS | Status: DC
  Administered 2015-05-23 (×2): 1 g via INTRAVENOUS
  Filled 2015-05-23 (×3): qty 1

## 2015-05-23 NOTE — Progress Notes (Signed)
[facility]                                                                              Patient Demographics  Guy [name], is a 81 y.o. male, DOB - 1919-10-09, CXK:[id number]  Admit date - 05/20/2015   Admitting Physician Guy Maxon Rama, MD  Outpatient Primary MD for the patient is Guy Patricia, MD  LOS - 3   Chief Complaint  Patient presents with  . Weakness       Brief HPI   Patient is a 81 year old male with a PMH of hypertension, diabetes mellitus, GERD, history of stroke, metastatic prostate cancer to the lungs and ribs (follows at [facility]), CAD with history of cardiac cath in 2004 and recent negative stress test, recent hospitalization for treatment of sepsis thought to be from UTI (but urine cultures negative), who was brought into the hospital with weakness, chills, abdominal discomfort, and diarrhea. Patient was found to have mild lactic acidosis, hypoglycemia, elevated troponins. WBC 15.5, chest x-ray was negative, CT of the abdomen showed proctitis with retained stool.   Assessment & Plan    Principal Problem: Generalized weakness, multifactorial; in the setting of advanced prostate cancer, dehydration, lactic acidosis and hypoglycemia - Chest x-ray clear, proctitis could be due to constipation, follow blood cultures and urine cultures so far negative - recent start on ?Amitrys so unclear if this might have caused some  - Lactic acid normalized after IV fluid hydration   Abdominal pain/diarrhea/proctitis - CT of abdomen/pelvis only showed proctitis, likely from having had an enema, with stool above the inflamed rectum. -Continue bowel regimen with reg miralax   Prostate cancer metastatic to multiple sites -Followed by urology/oncology at [facility]   Dehydration / acute kidney injury superimposed on CKD stgIII -Lactic acidosis improved after hydration. Has a chronic indwelling Foley.  -No  hydronephrosis noted on CT of the abdomen/pelvis.   Leukocytosis/lactic acidosis  -May be reflective of a possible infection versus hemoconcentration. Lactic acidosis could be due to metformin. - Lactic acidosis resolved, follow urine culture   Elevated troponin with h/o CAD s/p stent 1996-last myoview 2008 wnl - Continue aspirin, beta blocker, Norvasc. -  2-D echocardiogram still pending    Hypoglycemia / Type 2 diabetes mellitus without complication Resolved with dextrose, continue sliding scale insulin  Severe P-EM Poor dentition  Code Status: DO NOT RESUSCITATE  Family Communication: Discussed in detail with the patient, all imaging results, lab results explained to the patient and wife on the phone. She requested home health PT, OT, HHA, HRN   Disposition Plan: DC in a.m.  Time Spent in minutes   25 minutes  Procedures  CT abdomen  Consults   None  DVT Prophylaxis  Lovenox  Medications  Scheduled Meds: . amLODipine  5 mg Oral Daily  . aspirin EC  81 mg Oral Daily  . calcium-vitamin D  1 tablet Oral BID  . cefTAZidime (FORTAZ)  IV  1 g Intravenous Q12H  . cycloSPORINE  1 drop Both Eyes BID  . enoxaparin (LOVENOX) injection  40 mg Subcutaneous Q24H  . enzalutamide  160 mg Oral  Q2000  . feeding supplement (ENSURE ENLIVE)  237 mL Oral BID BM  . fluticasone  2 spray Each Nare Daily  . gabapentin  100 mg Oral QHS  . insulin aspart  0-9 Units Subcutaneous TID WC  . insulin aspart  3 Units Subcutaneous TID WC  . lipase/protease/amylase  2 capsule Oral TID  . metoprolol succinate  12.5 mg Oral Daily  . multivitamin with minerals  1 tablet Oral q morning - 10a  . oxybutynin  5 mg Oral Daily  . pantoprazole  40 mg Oral Daily  . polyethylene glycol  17 g Oral Daily  . sertraline  100 mg Oral BID  . silver sulfADIAZINE  1 application Topical BID  . simvastatin  10 mg Oral QODAY  . sodium chloride  3 mL Intravenous Q12H   Continuous Infusions: . sodium chloride  50 mL/hr at 05/23/15 0552   PRN Meds:.acetaminophen **OR** acetaminophen, alum & mag hydroxide-simeth, camphor-menthol, HYDROcodone-acetaminophen, hydrOXYzine, nitroGLYCERIN, ondansetron **OR** ondansetron (ZOFRAN) IV   Antibiotics   Anti-infectives    Start     Dose/Rate Route Frequency Ordered Stop   05/23/15 1000  cefTAZidime (FORTAZ) 1 g in dextrose 5 % 50 mL IVPB     1 g 100 mL/hr over 30 Minutes Intravenous Every 12 hours 05/23/15 0901     05/20/15 2100  cefTAZidime (FORTAZ) 2 g in dextrose 5 % 50 mL IVPB  Status:  Discontinued     2 g 100 mL/hr over 30 Minutes Intravenous Every 24 hours 05/20/15 2043 05/23/15 0901   05/20/15 2100  vancomycin (VANCOCIN) IVPB 1000 mg/200 mL premix     1,000 mg 200 mL/hr over 60 Minutes Intravenous  Once 05/20/15 2054 05/20/15 2249   05/20/15 2045  metroNIDAZOLE (FLAGYL) IVPB 500 mg     500 mg 100 mL/hr over 60 Minutes Intravenous  Once 05/20/15 2043 05/20/15 2140        Subjective:   Guy [name] was seen and examined today. No abd pain currently, no fever, chills, nausea, vomiting. Patient denies dizziness, chest pain, shortness of breath, new weakness, numbess, tingling. No acute events overnight.    Objective:   Blood pressure 145/58, pulse 62, temperature 98.1 F (36.7 C), temperature source Oral, resp. rate 18, height '5\' 6"'$  (1.676 m), weight 57.244 kg (126 lb 3.2 oz), SpO2 94 %.  Wt Readings from Last 3 Encounters:  05/21/15 57.244 kg (126 lb 3.2 oz)  03/14/15 90.583 kg (199 lb 11.2 oz)  12/18/14 55.055 kg (121 lb 6 oz)     Intake/Output Summary (Last 24 hours) at 05/23/15 1349 Last data filed at 05/23/15 0700  Gross per 24 hour  Intake 1627.5 ml  Output    800 ml  Net  827.5 ml    Exam  General: Alert and oriented x 3, NAD, frail ill-appearing  HEENT:  PERRLA, EOMI, Anicteric Sclera, mucous membranes moist.   Neck: Supple, no JVD, no masses  CVS: S1 S2 clear, RRR  Respiratory: CTAB  Abdomen: Soft, NT,  nondistended, + bowel sounds  Ext: no cyanosis clubbing or edema  Neuro: no new deficits  Skin: No rashes  Psych: Normal affect and demeanor, alert and oriented x3    Data Review   Micro Results Recent Results (from the past 240 hour(s))  Blood Culture (routine x 2)     Status: None (Preliminary result)   Collection Time: 05/20/15  8:15 PM  Result Value Ref Range Status   Specimen Description BLOOD LEFT FOREARM  Final  Special Requests BOTTLES DRAWN AEROBIC AND ANAEROBIC 5CC  Final   Culture   Final    NO GROWTH 1 DAY Performed at [facility]    Report Status PENDING  Incomplete  Urine culture     Status: None (Preliminary result)   Collection Time: 05/20/15  8:33 PM  Result Value Ref Range Status   Specimen Description URINE, RANDOM  Final   Special Requests NONE  Final   Culture   Final    >=100,000 COLONIES/mL GRAM NEGATIVE RODS Performed at [facility]    Report Status PENDING  Incomplete  Blood Culture (routine x 2)     Status: None (Preliminary result)   Collection Time: 05/20/15  9:10 PM  Result Value Ref Range Status   Specimen Description BLOOD RIGHT FOREARM  Final   Special Requests BOTTLES DRAWN AEROBIC AND ANAEROBIC 4CC  Final   Culture   Final    NO GROWTH 1 DAY Performed at [facility]    Report Status PENDING  Incomplete  MRSA PCR Screening     Status: None   Collection Time: 05/21/15  1:50 AM  Result Value Ref Range Status   MRSA by PCR NEGATIVE NEGATIVE Final    Comment:        The GeneXpert MRSA Assay (FDA approved for NASAL specimens only), is one component of a comprehensive MRSA colonization surveillance program. It is not intended to diagnose MRSA infection nor to guide or monitor treatment for MRSA infections.   Urine culture     Status: None (Preliminary result)   Collection Time: 05/22/15  2:12 PM  Result Value Ref Range Status   Specimen Description URINE, CATHETERIZED  Final   Special Requests NONE   Final   Culture   Final    NO GROWTH < 24 HOURS Performed at [facility]    Report Status PENDING  Incomplete    Radiology Reports Ct Abdomen Pelvis Wo Contrast  05/20/2015   CLINICAL DATA:  Headache and diarrhea.  EXAM: CT ABDOMEN AND PELVIS WITHOUT CONTRAST  TECHNIQUE: Multidetector CT imaging of the abdomen and pelvis was performed following the standard protocol without IV contrast.  COMPARISON:  09/05/2005  FINDINGS: BODY WALL: Mild gynecomastia asymmetric to the left.  Mild anasarca.  LOWER CHEST: Gastroesophageal reflux. Small left pleural effusion with dependent atelectasis.  ABDOMEN/PELVIS:  Liver: No focal abnormality.  Biliary: Cholecystectomy with mild enlargement of the common bile duct, likely reservoir effect.  Pancreas: No acute finding.  Spleen: Unremarkable.  Adrenals: Unremarkable.  Kidneys and ureters: Bilateral renal cortical cysts. Bilateral renal atrophy without hydronephrosis. Punctate bilateral renal calcifications could be arterial or urinary.  Bladder: Decompressed by a Foley catheter.  Reproductive: Prostatectomy and pelvic lymphadenectomy. No evidence of pelvic adenopathy. There is a penile prosthesis with deflated reservoir ventrally.  Bowel: Circumferential thickening of the rectum with extensive mesorectal edema. There is prominent colonic stool and gas above the inflammation. No indication of previous radiation therapy. There is stool within the inflamed rectum but not as large as usually seen with stercoral colitis.  Retroperitoneum: Pelvic lymphadenectomy.  No evidence of adenopathy.  Peritoneum: No ascites or pneumoperitoneum.  Vascular: No acute abnormality.  OSSEOUS: Remote L1 and L3 compression fractures status post vertebroplasty. Bilateral sacroplasty. Advanced lumbar degenerative disc disease with dextroscoliosis. Profound osteopenia.  IMPRESSION: 1. Proctitis. 2. Moderate retention of stool and gas above the inflamed rectum. 3. Chronic findings are noted  above.   Electronically Signed   By: Angelica Chessman  Watts M.D.   On: 05/20/2015 22:47   Ct Head Wo Contrast  05/20/2015   CLINICAL DATA:  Headache.  EXAM: CT HEAD WITHOUT CONTRAST  TECHNIQUE: Contiguous axial images were obtained from the base of the skull through the vertex without intravenous contrast.  COMPARISON:  04/02/2014  FINDINGS: Skull and Sinuses:  No acute fracture or destructive process.  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  Chronic right maxillary sinusitis containing partly calcified debris, stable from 2015. No surrounding fat inflammation.  Orbits: Bilateral cataract resection.  Brain: No evidence of acute infarction, hemorrhage, hydrocephalus, or mass lesion/mass effect.  Generalized cortical atrophy which is stable from 2015. Extensive chronic small vessel disease with gliosis throughout the bilateral cerebral white matter and bilateral deep gray nuclei, left corona radiata, and bilateral cerebellar small vessel infarcts. Further indistinct appearance of the basal ganglia is likely from dilated perivascular spaces.  IMPRESSION: 1. No acute finding. 2. Brain atrophy and extensive chronic small vessel disease.   Electronically Signed   By: Monte Fantasia M.D.   On: 05/20/2015 22:52   Dg Chest Port 1 View  05/20/2015   CLINICAL DATA:  History hypertension and COPD. Status post fall today.  EXAM: PORTABLE CHEST - 1 VIEW  COMPARISON:  April 02, 2014  FINDINGS: The heart size and mediastinal contours are stable. The heart size is enlarged. There is no focal infiltrate, pulmonary edema, or pleural effusion. Chronic mild opacity over the left mid lung possibly calcified pleural plaques are unchanged. Chronic deformities of the right humerus, left clavicle, multiple left ribs are unchanged.  IMPRESSION: No active cardiopulmonary disease.  Stable chronic changes.   Electronically Signed   By: Abelardo Diesel M.D.   On: 05/20/2015 21:24    CBC  Recent Labs Lab 05/20/15 2011  05/20/15 2118 05/21/15 0155 05/22/15 0439 05/23/15 0459  WBC 15.5*  --  8.9 8.2 6.2  HGB 12.8* 13.3 10.3* 10.7* 10.4*  HCT 37.3* 39.0 29.7* 30.7* 29.6*  PLT 190  --  110* 103* 110*  MCV 98.2  --  97.7 98.7 98.0  MCH 33.7  --  33.9 34.4* 34.4*  MCHC 34.3  --  34.7 34.9 35.1  RDW 14.3  --  14.4 14.4 14.4  LYMPHSABS  --   --  0.9 1.2  --   MONOABS  --   --  0.7 0.8  --   EOSABS  --   --  0.0 0.4  --   BASOSABS  --   --  0.0 0.0  --     Chemistries   Recent Labs Lab 05/20/15 2011 05/20/15 2118 05/21/15 0155 05/22/15 0617 05/23/15 0459  NA 137 139 135 136 138  K 3.8 4.0 3.9 3.5 3.8  CL 106 104 106 110 111  CO2 20*  --  20* 20* 19*  GLUCOSE 62* 51* 208* 96 226*  BUN 33* 32* 29* 23* 22*  CREATININE 1.36* 1.30* 1.25* 1.15 1.14  CALCIUM 9.2  --  8.0* 7.9* 8.0*  AST 41  --   --   --   --   ALT 16*  --   --   --   --   ALKPHOS 69  --   --   --   --   BILITOT 1.4*  --   --   --   --    ------------------------------------------------------------------------------------------------------------------ estimated creatinine clearance is 33.5 mL/min (by C-G formula based on Cr of 1.14). ------------------------------------------------------------------------------------------------------------------ No results for input(s): HGBA1C in the  last 72 hours. ------------------------------------------------------------------------------------------------------------------ No results for input(s): CHOL, HDL, LDLCALC, TRIG, CHOLHDL, LDLDIRECT in the last 72 hours. ------------------------------------------------------------------------------------------------------------------ No results for input(s): TSH, T4TOTAL, T3FREE, THYROIDAB in the last 72 hours.  Invalid input(s): FREET3 ------------------------------------------------------------------------------------------------------------------ No results for input(s): VITAMINB12, FOLATE, FERRITIN, TIBC, IRON, RETICCTPCT in the last 72  hours.  Coagulation profile No results for input(s): INR, PROTIME in the last 168 hours.  No results for input(s): DDIMER in the last 72 hours.  Cardiac Enzymes  Recent Labs Lab 05/20/15 2309 05/21/15 0155 05/21/15 0756  TROPONINI 0.28* 0.30* 0.31*   ------------------------------------------------------------------------------------------------------------------ Invalid input(s): POCBNP   Recent Labs  05/22/15 1158 05/22/15 1650 05/22/15 2122 05/22/15 2253 05/23/15 0833 05/23/15 1142  GLUCAP 138* 133* 16 76 167* 163*     Guy [name],Guy [name] M.D. [facility] 05/23/2015, 1:49 PM  Pager: [phone] Between 7am to 7pm - call Pager - [phone]  After 7pm go to [email or web address] - password [email or web address]  Call night coverage person covering after 7pm

## 2015-05-23 NOTE — Progress Notes (Signed)
ANTIBIOTIC CONSULT NOTE - FOLLOW UP  Pharmacy Consult for ceftazidime Indication: empiric coverage for suspected sepsis  Allergies  Allergen Reactions  . Albuterol Sulfate Hfa [Kdc:Albuterol] Shortness Of Breath and Swelling  . Adhesive [Tape] Other (See Comments)    REACTION: SKIN BLISTERS  . Albuterol Swelling    Per pt., side of his neck began to swell with nebulized Albuterol in doctor's office.  . Contrast Media [Iodinated Diagnostic Agents] Other (See Comments)    unknown Reaction unknown  . Ioxaglate Other (See Comments)    Reaction unknown  . Lactose     Other reaction(s): GI Upset (intolerance)  . Metrizamide Other (See Comments)    Reaction unknown  . Penicillins Other (See Comments) and Itching    REACTION: ITCHING HANDS  . Sulfa Drugs Cross Reactors Hives  . Budesonide-Formoterol Fumarate Rash  . Sulfamethoxazole Rash    Patient Measurements: Height: '5\' 6"'$  (167.6 cm) Weight: 126 lb 3.2 oz (57.244 kg) IBW/kg (Calculated) : 63.8  Vital Signs: Temp: 98.1 F (36.7 C) (08/23 0532) Temp Source: Oral (08/23 0532) BP: 135/59 mmHg (08/23 0532) Pulse Rate: 65 (08/23 0532) Intake/Output from previous day: 08/22 0701 - 08/23 0700 In: 1867.5 [P.O.:480; I.V.:1337.5; IV Piggyback:50] Out: 800 [Urine:800] Intake/Output from this shift:    Labs:  Recent Labs  05/21/15 0155 05/22/15 0439 05/22/15 0617 05/23/15 0459  WBC 8.9 8.2  --  6.2  HGB 10.3* 10.7*  --  10.4*  PLT 110* 103*  --  110*  CREATININE 1.25*  --  1.15 1.14   Estimated Creatinine Clearance: 33.5 mL/min (by C-G formula based on Cr of 1.14). No results for input(s): VANCOTROUGH, VANCOPEAK, VANCORANDOM, GENTTROUGH, GENTPEAK, GENTRANDOM, TOBRATROUGH, TOBRAPEAK, TOBRARND, AMIKACINPEAK, AMIKACINTROU, AMIKACIN in the last 72 hours.   Microbiology: Recent Results (from the past 720 hour(s))  Blood Culture (routine x 2)     Status: None (Preliminary result)   Collection Time: 05/20/15  8:15 PM  Result  Value Ref Range Status   Specimen Description BLOOD LEFT FOREARM  Final   Special Requests BOTTLES DRAWN AEROBIC AND ANAEROBIC 5CC  Final   Culture   Final    NO GROWTH 1 DAY Performed at [facility]    Report Status PENDING  Incomplete  Urine culture     Status: None (Preliminary result)   Collection Time: 05/20/15  8:33 PM  Result Value Ref Range Status   Specimen Description URINE, RANDOM  Final   Special Requests NONE  Final   Culture   Final    CULTURE REINCUBATED FOR BETTER GROWTH Performed at [facility]    Report Status PENDING  Incomplete  Blood Culture (routine x 2)     Status: None (Preliminary result)   Collection Time: 05/20/15  9:10 PM  Result Value Ref Range Status   Specimen Description BLOOD RIGHT FOREARM  Final   Special Requests BOTTLES DRAWN AEROBIC AND ANAEROBIC 4CC  Final   Culture   Final    NO GROWTH 1 DAY Performed at [facility]    Report Status PENDING  Incomplete  MRSA PCR Screening     Status: None   Collection Time: 05/21/15  1:50 AM  Result Value Ref Range Status   MRSA by PCR NEGATIVE NEGATIVE Final    Comment:        The GeneXpert MRSA Assay (FDA approved for NASAL specimens only), is one component of a comprehensive MRSA colonization surveillance program. It is not intended to diagnose MRSA infection nor  to guide or monitor treatment for MRSA infections.     Anti-infectives    Start     Dose/Rate Route Frequency Ordered Stop   05/20/15 2100  cefTAZidime (FORTAZ) 2 g in dextrose 5 % 50 mL IVPB     2 g 100 mL/hr over 30 Minutes Intravenous Every 24 hours 05/20/15 2043     05/20/15 2100  vancomycin (VANCOCIN) IVPB 1000 mg/200 mL premix     1,000 mg 200 mL/hr over 60 Minutes Intravenous  Once 05/20/15 2054 05/20/15 2249   05/20/15 2045  metroNIDAZOLE (FLAGYL) IVPB 500 mg     500 mg 100 mL/hr over 60 Minutes Intravenous  Once 05/20/15 2043 05/20/15 2140      Assessment: Patient's a 81 y.o M on ceftazidime  day #4 for suspected sepsis.  All cultures have been negative thus far and renal function is improving.  Afeb, WBC wnl,  SCr trending down, CrCl~33  8/20 >> ceftaz >>  8/20 bcx x 2: ntgd 8/20 ucx: ngtd 8/21 MRSA PCR (-)  Plan:  - change ceftazidime to 1gm IV q12h  Karder Goodin P 05/23/2015,8:53 AM

## 2015-05-23 NOTE — Progress Notes (Signed)
Echocardiogram 2D Echocardiogram has been performed.  Guy [name] 05/23/2015, 2:22 PM

## 2015-05-23 NOTE — Care Management Important Message (Signed)
Important Message  Patient Details  Name: Guy [name] MRN: [id number] Date of Birth: 03/15/1920   Medicare Important Message Given:  [facility] notification given    Camillo Flaming 05/23/2015, 12:09 [place] Message  Patient Details  Name: Guy [name] MRN: [id number] Date of Birth: [birth year]   Medicare Important Message Given:  Yes-second notification given    Camillo Flaming 05/23/2015, 12:09 PM

## 2015-05-24 LAB — URINE CULTURE
CULTURE: NO GROWTH
Culture: >=100000

## 2015-05-24 LAB — BASIC METABOLIC PANEL
ANION GAP: 4 — AB (ref 5–15)
BUN: 22 mg/dL — ABNORMAL HIGH (ref 6–20)
CHLORIDE: 113 mmol/L — AB (ref 101–111)
CO2: 21 mmol/L — AB (ref 22–32)
Calcium: 8 mg/dL — ABNORMAL LOW (ref 8.9–10.3)
Creatinine, Ser: 0.94 mg/dL (ref 0.61–1.24)
GFR calc non Af Amer: >60 mL/min (ref >60)
Glucose, Bld: 132 mg/dL — ABNORMAL HIGH (ref 65–99)
POTASSIUM: 3.5 mmol/L (ref 3.5–5.1)
Sodium: 138 mmol/L (ref 135–145)

## 2015-05-24 LAB — CBC
HEMATOCRIT: 28.8 % — AB (ref 39.0–52.0)
HEMOGLOBIN: 10.1 g/dL — AB (ref 13.0–17.0)
MCH: 34.1 pg — AB (ref 26.0–34.0)
MCHC: 35.1 g/dL (ref 30.0–36.0)
MCV: 97.3 fL (ref 78.0–100.0)
Platelets: 92 10*3/uL — ABNORMAL LOW (ref 150–400)
RBC: 2.96 MIL/uL — AB (ref 4.22–5.81)
RDW: 14.4 % (ref 11.5–15.5)
WBC: 5.3 10*3/uL (ref 4.0–10.5)

## 2015-05-24 LAB — GLUCOSE, CAPILLARY: Glucose-Capillary: 127 mg/dL — ABNORMAL HIGH (ref 65–99)

## 2015-05-24 MED ORDER — TRAMADOL HCL 50 MG PO TABS: 50.0000 mg | ORAL_TABLET | Freq: Four times a day (QID) | ORAL | Status: DC | PRN

## 2015-05-24 MED ORDER — METRONIDAZOLE 500 MG PO TABS: 500.0000 mg | ORAL_TABLET | Freq: Three times a day (TID) | ORAL | Status: DC

## 2015-05-24 MED ORDER — METRONIDAZOLE 500 MG PO TABS
500.0000 mg | ORAL_TABLET | Freq: Three times a day (TID) | ORAL | Status: DC
  Administered 2015-05-24: 500 mg via ORAL
  Filled 2015-05-24: qty 1

## 2015-05-24 MED ORDER — SENNOSIDES-DOCUSATE SODIUM 8.6-50 MG PO TABS: 1.0000 | ORAL_TABLET | Freq: Every day | ORAL | Status: AC

## 2015-05-24 MED ORDER — INSULIN GLARGINE 100 UNIT/ML ~~LOC~~ SOLN: 10.0000 [IU] | Freq: Every day | SUBCUTANEOUS | Status: DC

## 2015-05-24 MED ORDER — POLYETHYLENE GLYCOL 3350 17 G PO PACK: 17.0000 g | PACK | Freq: Every day | ORAL | Status: DC

## 2015-05-24 MED ORDER — CEFPODOXIME PROXETIL 200 MG PO TABS: 200.0000 mg | ORAL_TABLET | Freq: Two times a day (BID) | ORAL | Status: DC

## 2015-05-24 MED ORDER — CEFPODOXIME PROXETIL 200 MG PO TABS
200.0000 mg | ORAL_TABLET | Freq: Two times a day (BID) | ORAL | Status: DC
  Administered 2015-05-24: 200 mg via ORAL
  Filled 2015-05-24 (×2): qty 1

## 2015-05-24 MED ORDER — LOSARTAN POTASSIUM 50 MG PO TABS
25.0000 mg | ORAL_TABLET | Freq: Every day | ORAL | Status: DC
  Administered 2015-05-24: 25 mg via ORAL
  Filled 2015-05-24: qty 1

## 2015-05-24 MED ORDER — LOSARTAN POTASSIUM 25 MG PO TABS: 25.0000 mg | ORAL_TABLET | Freq: Every day | ORAL | Status: DC

## 2015-05-24 NOTE — Care Management Note (Signed)
Case Management Note  Patient Details  Name: Guy [name] MRN: [id number] Date of Birth: 09/06/1920  Subjective/Objective:                   Generalized weakness Action/Plan: Discharge planning  Expected Discharge Date:  05/24/15             Expected Discharge Plan:  [place]  In-House Referral:     Discharge planning Services  CM Consult  Post Acute Care Choice:  Home Health Choice offered to:  Spouse  DME Arranged:    DME Agency:     HH Arranged:  PT, RN, OT, Nurse's Aide HH Agency:  [place]  Status of Service:  Completed, signed off  Medicare Important Message Given:  Yes-second notification given Date Medicare IM Given:    Medicare IM give by:    Date Additional Medicare IM Given:    Additional Medicare Important Message give by:     If discussed at [place] of Stay Meetings, dates discussed:    Additional Comments: CM notified [employer] rep, farrah of pt discharge.  Sheppard Evens has access to [facility] negating need for faxing any information.  No other CM needs were communicated. Dellie Catholic, RN 05/24/2015, 12:42 PM

## 2015-05-24 NOTE — Discharge Summary (Addendum)
Physician Discharge Summary   Patient ID: Guy [name] MRN: [id number] DOB/AGE: November 19, 1919 81 y.o.  Admit date: 05/20/2015 Discharge date: 05/24/2015  Primary Care Physician:  [name], MD  Discharge Diagnoses:   . Proctitis Klebsiella UTI   . Elevated troponin . Hypoglycemia . Prostate cancer metastatic to multiple sites . Dehydration . Leukocytosis   Consults:  None   Recommendations for Outpatient Follow-up:  Patient was placed on losartan 25 mg daily, echo showed EF of 45-50%  Please follow BMET at the follow-up appointment.  Patient had hypoglycemia at the time of admission, during the hospitalization, he had some lower BS readings hence Lantus has been discontinued. Please follow closely for insulin regimen. Currently he is on sliding scale insulin only    DIET: Carb modified diet    Allergies:   Allergies  Allergen Reactions  . Albuterol Sulfate Hfa [Kdc:Albuterol] Shortness Of Breath and Swelling  . Adhesive [Tape] Other (See Comments)    REACTION: SKIN BLISTERS  . Albuterol Swelling    Per pt., side of his neck began to swell with nebulized Albuterol in doctor's office.  . Contrast Media [Iodinated Diagnostic Agents] Other (See Comments)    unknown Reaction unknown  . Ioxaglate Other (See Comments)    Reaction unknown  . Lactose     Other reaction(s): GI Upset (intolerance)  . Metrizamide Other (See Comments)    Reaction unknown  . Penicillins Other (See Comments) and Itching    REACTION: ITCHING HANDS  . Sulfa Drugs Cross Reactors Hives  . Budesonide-Formoterol Fumarate Rash  . Sulfamethoxazole Rash     Discharge Medications:   Medication List    STOP taking these medications        amLODipine 10 MG tablet  Commonly known as:  NORVASC     hydrOXYzine 25 MG tablet  Commonly known as:  ATARAX/VISTARIL     insulin glargine 100 UNIT/ML injection  Commonly known as:  LANTUS     mupirocin ointment 2 %  Commonly known as:   BACTROBAN      TAKE these medications        aspirin 81 MG tablet  Take 81 mg by mouth daily. For heart theraphy     CALCIUM 600+D HIGH POTENCY 600-400 MG-UNIT per tablet  Generic drug:  Calcium Carbonate-Vitamin D  Take 1 tablet by mouth 2 (two) times daily.     camphor-menthol lotion  Commonly known as:  SARNA  Apply 1 application topically daily as needed for itching.     cefpodoxime 200 MG tablet  Commonly known as:  VANTIN  Take 1 tablet (200 mg total) by mouth 2 (two) times daily. X 5 days     CRANBERRY CONCENTRATE PO  Take 4 capsules by mouth daily.     cycloSPORINE 0.05 % ophthalmic emulsion  Commonly known as:  RESTASIS  Place 1 drop into both eyes 2 (two) times daily.     fluticasone 50 MCG/ACT nasal spray  Commonly known as:  FLONASE  Place 2 sprays into the nose 2 (two) times daily.     gabapentin 100 MG capsule  Commonly known as:  NEURONTIN  Take 1 capsule by mouth at bedtime.     insulin aspart 100 UNIT/ML injection  Commonly known as:  novoLOG  Inject 0-6 Units into the skin 3 (three) times daily with meals. 0-150=0; 151-200=2u; 201-250=3u; 251-300=4u; 301-350=5u; over 350 =6u prior to meals. With breakfast and lunch.     lipase/protease/amylase 12000 UNITS Cpep capsule  Commonly known as:  CREON  Take 2 capsules by mouth 3 (three) times daily. For AKI     losartan 25 MG tablet  Commonly known as:  COZAAR  Take 1 tablet (25 mg total) by mouth daily.     Melatonin 5 MG Tabs  Take 5 mg by mouth at bedtime. For insomnia     metoprolol succinate 25 MG 24 hr tablet  Commonly known as:  TOPROL-XL  Take 12.5 mg by mouth daily. For HTN     metroNIDAZOLE 500 MG tablet  Commonly known as:  FLAGYL  Take 1 tablet (500 mg total) by mouth 3 (three) times daily. X 5 days     multivitamins ther. w/minerals Tabs tablet  Take 1 tablet by mouth every morning.     nitroGLYCERIN 0.4 MG SL tablet  Commonly known as:  NITROSTAT  Place 0.4 mg under the tongue.      omeprazole 20 MG capsule  Commonly known as:  PRILOSEC  Take 20 mg by mouth 2 (two) times daily before a meal. For GERD     oxybutynin 5 MG tablet  Commonly known as:  DITROPAN  Take 5 mg by mouth daily. For bladder urgency     polyethylene glycol packet  Commonly known as:  MIRALAX / GLYCOLAX  Take 17 g by mouth daily.     senna-docusate 8.6-50 MG per tablet  Commonly known as:  SENOKOT S  Take 1 tablet by mouth at bedtime. For constipation     sertraline 100 MG tablet  Commonly known as:  ZOLOFT  Take 100 mg by mouth 2 (two) times daily. For depression     SILVADENE 1 % cream  Generic drug:  silver sulfADIAZINE  Apply 1 application topically 2 (two) times daily.     simvastatin 20 MG tablet  Commonly known as:  ZOCOR  Take 10 mg by mouth every other day. Take 1/2 tablet =10 mg for HLD.     traMADol 50 MG tablet  Commonly known as:  ULTRAM  Take 1 tablet (50 mg total) by mouth every 6 (six) hours as needed for moderate pain or severe pain.     triamcinolone ointment 0.1 %  Commonly known as:  KENALOG  Apply 1 application topically 2 (two) times daily.     XTANDI 40 MG capsule  Generic drug:  enzalutamide  Take 160 mg by mouth daily.         Brief H and P: For complete details please refer to admission H and P, but in brief Patient is a 81 year old male with a PMH of hypertension, diabetes mellitus, GERD, history of stroke, metastatic prostate cancer to the lungs and ribs (follows at [facility]), CAD with history of cardiac cath in 2004 and recent negative stress test, recent hospitalization for treatment of sepsis thought to be from UTI (but urine cultures negative), who was brought into the hospital with weakness, chills, abdominal discomfort, and diarrhea. Patient was found to have mild lactic acidosis, hypoglycemia, elevated troponins. WBC 15.5, chest x-ray was negative, CT of the abdomen showed proctitis with retained stool.  Hospital Course:  Generalized  weakness, diarrhea, multifactorial; in the setting of advanced prostate cancer, dehydration, lactic acidosis and hypoglycemia Chest x-ray was clear at the time of admission, no pneumonia. Due to diarrhea lactic acidosis, patient underwent CT abdomen and pelvis which showed proctitis and moderate retention of stool and gas above the inflamed rectum. Patient was placed on IV fortaz. Recent start on ?Amitrys so  unclear if this might have caused any of the symptoms. Lactic acid was normalized after the IV fluid hydration. Patient was placed on Vantin and Flagyl for 5 days to finish up the course. Patient was also placed on MiraLAX and Senokot to avoid constipation. Home health PT, OT, RN, HHA will be arranged by the case manager prior to discharge.    Abdominal pain/diarrhea/proctitis - CT of abdomen/pelvis only showed proctitis, likely from having had an enema, with stool above the inflamed rectum. Continue bowel regimen with reg miralax  Klebsiella UTI Patient received IV Fortaz during hospitalization, placed on Vantin at the time of discharge, for 5 more days will complete the course.   Prostate cancer metastatic to multiple sites -Followed by urology/oncology at [facility]   Dehydration / acute kidney injury superimposed on CKD stgIII -Lactic acidosis improved after hydration. Has a chronic indwelling Foley.  -No hydronephrosis noted on CT of the abdomen/pelvis.   Elevated troponin with h/o CAD s/p stent 1996-last myoview 2008 wnl - Continue aspirin, beta blocker. 2-D echo showed EF of 45-50% with diffuse hypokinesis patient had no chest pain or shortness of breath during hospitalization. Placed on low-dose losartan 25 mg daily. Please follow up BMET at the time of appointment. Patient is not a good candidate for any invasive procedures at this time and does not have any active cardiac symptoms. Patient has prolonged QTC on the EKG, discontinued  hydroxyzine from his outpatient meds. Avoid fluoroquinolones. Please follow QTC outpatient.   Hypoglycemia / Type 2 diabetes mellitus without complication Resolved with dextrose, continue sliding scale insulin only. Patient was noted to have lower blood sugar readings during hospitalization and Lantus had been on hold since admission. Hence Lantus was discontinued, patient will follow-up with his endocrinologist, further insulin regimen will be adjusted by his PCP/endocrinologist.    Day of Discharge BP 142/74 mmHg  Pulse 70  Temp(Src) 98.7 F (37.1 C) (Oral)  Resp 20  Ht '5\' 6"'$  (1.676 m)  Wt 57.244 kg (126 lb 3.2 oz)  BMI 20.38 kg/m2  SpO2 98%  Physical Exam: General: Alert and awake oriented x3 not in any acute distress. HEENT: anicteric sclera, pupils reactive to light and accommodation CVS: S1-S2 clear no murmur rubs or gallops Chest: clear to auscultation bilaterally, no wheezing rales or rhonchi Abdomen: soft nontender, nondistended, normal bowel sounds Extremities: no cyanosis, clubbing or edema noted bilaterally Neuro: Cranial nerves II-XII intact, no focal neurological deficits   The results of significant diagnostics from this hospitalization (including imaging, microbiology, ancillary and laboratory) are listed below for reference.    LAB RESULTS: Basic Metabolic Panel:  Recent Labs Lab 05/23/15 0459 05/24/15 0455  NA 138 138  K 3.8 3.5  CL 111 113*  CO2 19* 21*  GLUCOSE 226* 132*  BUN 22* 22*  CREATININE 1.14 0.94  CALCIUM 8.0* 8.0*   Liver Function Tests:  Recent Labs Lab 05/20/15 2011  AST 41  ALT 16*  ALKPHOS 69  BILITOT 1.4*  PROT 7.2  ALBUMIN 3.2*    Recent Labs Lab 05/20/15 2011  LIPASE 54*   No results for input(s): AMMONIA in the last 168 hours. CBC:  Recent Labs Lab 05/22/15 0439 05/23/15 0459 05/24/15 0455  WBC 8.2 6.2 5.3  NEUTROABS 5.8  --   --   HGB 10.7* 10.4* 10.1*  HCT 30.7* 29.6* 28.8*  MCV 98.7 98.0 97.3  PLT  103* 110* 92*   Cardiac Enzymes:  Recent Labs Lab 05/21/15 0155  05/21/15 0756  TROPONINI 0.30* 0.31*   BNP: Invalid input(s): POCBNP CBG:  Recent Labs Lab 05/23/15 2149 05/24/15 0740  GLUCAP 201* 127*    Significant Diagnostic Studies:  Ct Abdomen Pelvis Wo Contrast  05/20/2015   CLINICAL DATA:  Headache and diarrhea.  EXAM: CT ABDOMEN AND PELVIS WITHOUT CONTRAST  TECHNIQUE: Multidetector CT imaging of the abdomen and pelvis was performed following the standard protocol without IV contrast.  COMPARISON:  09/05/2005  FINDINGS: BODY WALL: Mild gynecomastia asymmetric to the left.  Mild anasarca.  LOWER CHEST: Gastroesophageal reflux. Small left pleural effusion with dependent atelectasis.  ABDOMEN/PELVIS:  Liver: No focal abnormality.  Biliary: Cholecystectomy with mild enlargement of the common bile duct, likely reservoir effect.  Pancreas: No acute finding.  Spleen: Unremarkable.  Adrenals: Unremarkable.  Kidneys and ureters: Bilateral renal cortical cysts. Bilateral renal atrophy without hydronephrosis. Punctate bilateral renal calcifications could be arterial or urinary.  Bladder: Decompressed by a Foley catheter.  Reproductive: Prostatectomy and pelvic lymphadenectomy. No evidence of pelvic adenopathy. There is a penile prosthesis with deflated reservoir ventrally.  Bowel: Circumferential thickening of the rectum with extensive mesorectal edema. There is prominent colonic stool and gas above the inflammation. No indication of previous radiation therapy. There is stool within the inflamed rectum but not as large as usually seen with stercoral colitis.  Retroperitoneum: Pelvic lymphadenectomy.  No evidence of adenopathy.  Peritoneum: No ascites or pneumoperitoneum.  Vascular: No acute abnormality.  OSSEOUS: Remote L1 and L3 compression fractures status post vertebroplasty. Bilateral sacroplasty. Advanced lumbar degenerative disc disease with dextroscoliosis. Profound osteopenia.  IMPRESSION:  1. Proctitis. 2. Moderate retention of stool and gas above the inflamed rectum. 3. Chronic findings are noted above.   Electronically Signed   By: Monte Fantasia M.D.   On: 05/20/2015 22:47   Ct Head Wo Contrast  05/20/2015   CLINICAL DATA:  Headache.  EXAM: CT HEAD WITHOUT CONTRAST  TECHNIQUE: Contiguous axial images were obtained from the base of the skull through the vertex without intravenous contrast.  COMPARISON:  04/02/2014  FINDINGS: Skull and Sinuses:  No acute fracture or destructive process.  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  Chronic right maxillary sinusitis containing partly calcified debris, stable from 2015. No surrounding fat inflammation.  Orbits: Bilateral cataract resection.  Brain: No evidence of acute infarction, hemorrhage, hydrocephalus, or mass lesion/mass effect.  Generalized cortical atrophy which is stable from 2015. Extensive chronic small vessel disease with gliosis throughout the bilateral cerebral white matter and bilateral deep gray nuclei, left corona radiata, and bilateral cerebellar small vessel infarcts. Further indistinct appearance of the basal ganglia is likely from dilated perivascular spaces.  IMPRESSION: 1. No acute finding. 2. Brain atrophy and extensive chronic small vessel disease.   Electronically Signed   By: Monte Fantasia M.D.   On: 05/20/2015 22:52   Dg Chest Port 1 View  05/20/2015   CLINICAL DATA:  History hypertension and COPD. Status post fall today.  EXAM: PORTABLE CHEST - 1 VIEW  COMPARISON:  April 02, 2014  FINDINGS: The heart size and mediastinal contours are stable. The heart size is enlarged. There is no focal infiltrate, pulmonary edema, or pleural effusion. Chronic mild opacity over the left mid lung possibly calcified pleural plaques are unchanged. Chronic deformities of the right humerus, left clavicle, multiple left ribs are unchanged.  IMPRESSION: No active cardiopulmonary disease.  Stable chronic changes.   Electronically  Signed   By: Abelardo Diesel M.D.   On: 05/20/2015 21:24  2D ECHO: Study Conclusions  - Left ventricle: The cavity size was normal. Wall thickness was increased in a pattern of severe LVH. Systolic function was mildly reduced. The estimated ejection fraction was in the range of 45% to 50%. Diffuse hypokinesis. Doppler parameters are consistent with abnormal left ventricular relaxation (grade 1 diastolic dysfunction). Doppler parameters are consistent with high ventricular filling pressure. - Aortic valve: There was mild regurgitation. - Mitral valve: Calcified annulus. There was mild regurgitation. - Left atrium: The atrium was severely dilated. - Atrial septum: There was an atrial septal aneurysm.  Disposition and Follow-up:     Discharge Instructions    Diet - low sodium heart healthy    Complete by:  As directed      Diet Carb Modified    Complete by:  As directed      Discharge instructions    Complete by:  As directed   Please note the medication changes before starting meds at home  Please note that lantus has been discontinued. Please follow up with Dr Altheimer before restarting lantus.     Increase activity slowly    Complete by:  As directed             DISPOSITION: Home with home health    DISCHARGE FOLLOW-UP Follow-up Information    Follow up with ALTHEIMER,MICHAEL D, MD. Schedule an appointment as soon as possible for a visit in 10 days.   Specialty:  Endocrinology   Why:  for hospital follow-up, obtain labs BMET    Contact information:   [place] [address] [phone]        Time spent on Discharge: 35 mins   Signed:   RAI,RIPUDEEP M.D. [facility] 05/24/2015, 10:10 AM Pager: [phone]

## 2015-05-26 LAB — CULTURE, BLOOD (ROUTINE X 2)
CULTURE: NO GROWTH
Culture: NO GROWTH

## 2015-06-06 ENCOUNTER — Telehealth: Payer: Self-pay | Admitting: Cardiovascular Disease

## 2015-06-06 NOTE — Telephone Encounter (Signed)
Mrs. Fitzsimmons is calling because of husbands severe ankle edema and wants him to have ad diuretic  No chest pain, shortness of breath.  Poor appetite, poor teeth. Prostate cancer reoccurrence.  Has been not been able to stand for weight at PCP office.  Nurse estimates weight under 90 lbs   Does not take a diuretic.  She called PCP office who told her to elevate his feet.  PCP office would like him to follow up cardiologist because of ankle edema and his PCP is out of town  Please advise

## 2015-06-06 NOTE — Telephone Encounter (Signed)
Mrs, Guy [name] is calling because Mr. Guy [name] ankles are swollen really bad .Please close   Thanks

## 2015-06-07 NOTE — Telephone Encounter (Signed)
Needs to be seen in the [facility] within the next 7 days

## 2015-06-07 NOTE — Telephone Encounter (Signed)
Mrs. Mcdiarmid called and agrees to appointment for her husband at 230pm on Friday 9/9 with Langston Masker on [address]

## 2015-06-07 NOTE — Telephone Encounter (Signed)
Needs to be seen in [facility] per Dr. Gwenlyn Found within the next 7 days.  Please schedule

## 2015-06-09 ENCOUNTER — Ambulatory Visit (INDEPENDENT_AMBULATORY_CARE_PROVIDER_SITE_OTHER): Payer: Medicare Other | Admitting: Cardiology

## 2015-06-09 ENCOUNTER — Encounter: Payer: Self-pay | Admitting: Cardiology

## 2015-06-09 VITALS — BP 118/58 | HR 63 | Ht 66.0 in | Wt 117.0 lb

## 2015-06-09 DIAGNOSIS — E876 Hypokalemia: Secondary | ICD-10-CM | POA: Diagnosis not present

## 2015-06-09 DIAGNOSIS — I2510 Atherosclerotic heart disease of native coronary artery without angina pectoris: Secondary | ICD-10-CM

## 2015-06-09 NOTE — Progress Notes (Signed)
Cardiology Office Note   Date:  06/11/2015   ID:  AHAMED HOFLAND, DOB December 16, 1919, MRN [id number]  PCP:  [name], MD  Cardiologist:  Dr. Gwenlyn Found    Chief Complaint  Patient presents with  . Leg Swelling    no pain, no SOB      History of Present Illness: Guy [name] is a 81 y.o. male who presents for lower ext edema.  He has been hospitalized twice since last office visit.    He has a history of coronary artery disease status post LAD stenting in 1996 with recent low risk nuclear stress test 6/15.  First hospitalization with urosepsis, abnormal EKG, prolonged QT, thrombocytopenia, DIC and mildly elevated troponin of 0.5.  The troponin was felt to be demand ischemia not true coronary syndrome.  His ejection fraction 45-50% with diffuse hypokinesis.  Severe LVH.  Continue with medical management.  He also had prolonged QT-in the setting of sepsis, electrolyte abnormality. Magnesium is being replenished as well as low potassium.   He was readmitted in August for weakness and hypoglycemia.  Also with diarrhea.  Since discharge he has lower ext edema.  No chest pain and no SOB.  He does have blister on his foot, followed by podiatrist and healing.  Past Medical History  Diagnosis Date  . Hypertension   . Diabetes mellitus without complication   . Arthritis   . Sciatica   . GERD (gastroesophageal reflux disease)   . Urge incontinence   . Vertigo   . Hyperlipidemia   . ED (erectile dysfunction)   . Gastric polyposis   . Osteoporosis   . Decreased libido   . Depression   . Hepatitis A   . Stroke   . History of blood transfusion     1946  . OA (osteoarthritis)   . Bladder cancer   . Prostate cancer     S/P prostatectomy; Lung metastasis  . CAD (coronary artery disease) 1996    stent to the LAD   . Hyperlipidemia   . Type 2 diabetes mellitus   . Ejection fraction   . COPD (chronic obstructive pulmonary disease)   . Ulcer of left heel     Past Surgical  History  Procedure Laterality Date  . Carpal tunnel release    . Cataract extraction w/ intraocular lens  implant, bilateral  1998  . Prostatectomy    . Urinary sphincter implant    . Urinary sphincter implant revision    . Appendectomy    . Left hip surgery      Donated bone for bone graft to arm  . Penile prosthesis implant      S/P removal and re-implantation of new prosthesis  . Middle ear surgery    . Finger surgery      Left and right  . Hernia repair    . Bladder surgery    . Right arm bone graft      Pathological fracture  . Cardiac catheterization  11/27/2001    patent stent with mild in-stent restenosis.  . Myoview  06/12/2009    Persantine myoview EF 76%; Normal myoview  . Coronary angioplasty  1996    stenting to the LAD in [place].      Current Outpatient Prescriptions  Medication Sig Dispense Refill  . aspirin 81 MG tablet Take 81 mg by mouth daily. For heart theraphy    . Calcium Carbonate-Vitamin D (CALCIUM 600+D HIGH POTENCY) 600-400 MG-UNIT per tablet Take 1  tablet by mouth 2 (two) times daily.      . camphor-menthol (SARNA) lotion Apply 1 application topically daily as needed for itching.    [name] CRANBERRY CONCENTRATE PO Take 4 capsules by mouth daily.    . cycloSPORINE (RESTASIS) 0.05 % ophthalmic emulsion Place 1 drop into both eyes 2 (two) times daily.    . enzalutamide (XTANDI) 40 MG capsule Take 160 mg by mouth daily.    . fluticasone (FLONASE) 50 MCG/ACT nasal spray Place 2 sprays into the nose 2 (two) times daily.    [name] gabapentin (NEURONTIN) 100 MG capsule Take 1 capsule by mouth at bedtime.    . insulin aspart (NOVOLOG) 100 UNIT/ML injection Inject 0-6 Units into the skin 3 (three) times daily with meals. 0-150=0; 151-200=2u; 201-250=3u; 251-300=4u; 301-350=5u; over 350 =6u prior to meals. With breakfast and lunch.    . lipase/protease/amylase (CREON-12/PANCREASE) 12000 UNITS CPEP Take 2 capsules by mouth 3 (three) times daily. For AKI    . losartan  (COZAAR) 25 MG tablet Take 1 tablet (25 mg total) by mouth daily. 30 tablet 3  . Melatonin 5 MG TABS Take 5 mg by mouth at bedtime. For insomnia    . metoprolol succinate (TOPROL-XL) 25 MG 24 hr tablet Take 12.5 mg by mouth daily. For HTN    . Multiple Vitamins-Minerals (MULTIVITAMINS THER. W/MINERALS) TABS Take 1 tablet by mouth every morning.     . nitroGLYCERIN (NITROSTAT) 0.4 MG SL tablet Place 0.4 mg under the tongue every 5 (five) minutes as needed for chest pain.    [name] omeprazole (PRILOSEC) 20 MG capsule Take 20 mg by mouth 2 (two) times daily before a meal. For GERD    . polyethylene glycol (MIRALAX / GLYCOLAX) packet Take 17 g by mouth daily. 30 each 4  . senna-docusate (SENOKOT S) 8.6-50 MG per tablet Take 1 tablet by mouth at bedtime. For constipation 30 tablet 3  . sertraline (ZOLOFT) 100 MG tablet Take 100 mg by mouth 2 (two) times daily. For depression    . silver sulfADIAZINE (SILVADENE) 1 % cream Apply 1 application topically 2 (two) times daily.    . simvastatin (ZOCOR) 20 MG tablet Take 10 mg by mouth every other day. Take 1/2 tablet =10 mg for HLD.    [name] traMADol (ULTRAM) 50 MG tablet Take 1 tablet (50 mg total) by mouth every 6 (six) hours as needed for moderate pain or severe pain. 30 tablet 0  . triamcinolone ointment (KENALOG) 0.1 % Apply 1 application topically 2 (two) times daily.     No current facility-administered medications for this visit.    Allergies:   Albuterol sulfate hfa; Adhesive; Albuterol; Contrast media; Ioxaglate; Lactose; Metrizamide; Penicillins; Sulfa drugs cross reactors; Budesonide-formoterol fumarate; and Sulfamethoxazole    Social History:  The patient  reports that he has quit smoking. He has never used smokeless tobacco. He reports that he does not drink alcohol or use illicit drugs. he and his wife have been married 57 years  Family History:  The patient's family history includes Bladder Cancer in his father; Breast cancer in his daughter; Heart  disease in his mother; Heart failure in his mother; Liver disease in his son; Lung cancer in his sister; Pancreatic cancer in his sister.    ROS:  General:no colds or fevers, large weight loss, he has prostate cancer with metastasis to multiple sites.  Skin:no rashes + ulcers on foot healing HEENT:no blurred vision, no congestion CV:see HPI PUL:see HPI GI:no diarrhea constipation  or melena, no indigestion GU:no hematuria, no dysuria- chronic foley  MS:no joint pain, no claudication Neuro:no syncope, no lightheadedness Endo:+ diabetes with recent hypoglycemia, no thyroid disease  Wt Readings from Last 3 Encounters:  06/09/15 117 lb (53.071 kg)  05/21/15 126 lb 3.2 oz (57.244 kg)  03/14/15 199 lb 11.2 oz (90.583 kg)     PHYSICAL EXAM: VS:  BP 118/58 mmHg  Pulse 63  Ht '5\' 6"'$  (1.676 m)  Wt 117 lb (53.071 kg)  BMI 18.89 kg/m2  SpO2 98% , BMI Body mass index is 18.89 kg/(m^2). General:Pleasant affect, NAD, in wheel chair Skin:Warm and dry, brisk capillary refill HEENT:normocephalic, sclera clear, mucus membranes moist Neck:supple, no JVD, no bruits  Heart:S1S2 RRR without murmur, gallup, rub or click Lungs:clear without rales, rhonchi, or wheezes WUJ:WJXB, non tender, + BS, do not palpate liver spleen or masses Ext:1-2+ lower ext edema, 2+ radial pulses Neuro:alert and oriented X 3, MAE, follows commands, + facial symmetry    EKG:  EKG is NOT ordered today.   Recent Labs: 05/20/2015: ALT 16* 05/24/2015: BUN 22*; Creatinine, Ser 0.94; Hemoglobin 10.1*; Platelets 92*; Potassium 3.5; Sodium 138    Lipid Panel    Component Value Date/Time   CHOL 80 12/30/2012 0500   TRIG 85 12/30/2012 0500   HDL 26* 12/30/2012 0500   CHOLHDL 3.1 12/30/2012 0500   VLDL 17 12/30/2012 0500   LDLCALC 37 12/30/2012 0500       Other studies Reviewed: Additional studies/ records that were reviewed today include: last nuc, discharge summaries. [name] ECHO: 05/23/15 Study Conclusions  - Left  ventricle: The cavity size was normal. Wall thickness was increased in a pattern of severe LVH. Systolic function was mildly reduced. The estimated ejection fraction was in the range of 45% to 50%. Diffuse hypokinesis. Doppler parameters are consistent with abnormal left ventricular relaxation (grade 1 diastolic dysfunction). Doppler parameters are consistent with high ventricular filling pressure. - Aortic valve: There was mild regurgitation. - Mitral valve: Calcified annulus. There was mild regurgitation. - Left atrium: The atrium was severely dilated. - Atrial septum: There was an atrial septal aneurysm.  Impressions:  - Mild global reduction in LV function; severe LVH; grade 1 diastolic dysfunction; elevated LV filling pressure; severe LAE; mild AI and MR.  ASSESSMENT AND PLAN:  1.  Lower ext edema- may be due to reduced EF, but no SOB.  I am hesitant to add diuretic currently with recent hypokalemia and prolonged QtC.  We discussed using support stockings as he is in W/C sitting freq.  They do not use salt.  If the foot ulcer is irritated with support stockings it may be better to use ACE wraps.  He has help with ADLs twice a day that could help apply.  We will check BMP and Mg+ level when he is seen by PCP on Tuesday.  They could not wait for labs today and will be in [place] on Monday.   If labs stable then could add low dose lasix.   Follow up with Dr. Gwenlyn Found in 2-3 months.  2. LV dysfunction in setting of urosepsis, continue BB and ACE.  Mild LV dysfunction in 2015 on myoview.  3. Hypokalemia, recheck.  4. CAD with hx of stent to LAD in 1996 and last cath 2003 with patent stent.  Last nuc 2015 ,  Without ischemia.  5. Hyperlipidemia continue statin  6.  Prostate cancer, with metastatic disease to multiple sites.   7. Resent urosepsis with chronic catheter.  Current medicines are reviewed with the patient today.  The patient Has no concerns  regarding medicines.  The following changes have been made:  See above Labs/ tests ordered today include:see above  Disposition:   FU:  see above  Lennie Muckle, NP  06/11/2015 5:58 PM    [facility] [place], [place], [place], [place] Phone: [phone]; Fax: [phone]

## 2015-06-09 NOTE — Patient Instructions (Signed)
Medication Instructions:  Your physician recommends that you continue on your current medications as directed. Please refer to the Current Medication list given to you today.   Labwork: BMP & MAGNESIUM (you will take the order with you) at your PCP's office.  Please have them fax the results to [phone].  Testing/Procedures: None ordered  Follow-Up: Will be determined based upon the lab results.    Any Other Special Instructions Will Be Listed Below (If Applicable). You have been given a lab order to take to your PCP's office.  Please have them fax the results to  [phone].

## 2015-06-11 ENCOUNTER — Encounter: Payer: Self-pay | Admitting: Cardiology

## 2015-06-15 ENCOUNTER — Telehealth: Payer: Self-pay | Admitting: Cardiovascular Disease

## 2015-06-15 NOTE — Telephone Encounter (Signed)
Received records from [facility] for appointment on 08/18/15 with Dr Gwenlyn Found.  Records given to [facility] (medical records) for Dr Kennon Holter schedule on 08/18/15. lp

## 2015-07-05 ENCOUNTER — Emergency Department (HOSPITAL_COMMUNITY)
Admission: EM | Admit: 2015-07-05 | Discharge: 2015-07-05 | Disposition: A | Discharge status: Home / Self Care | Payer: Medicare Other | Attending: Emergency Medicine | Admitting: Emergency Medicine

## 2015-07-05 ENCOUNTER — Encounter (HOSPITAL_COMMUNITY): Payer: Self-pay

## 2015-07-05 DIAGNOSIS — T83091A Other mechanical complication of indwelling urethral catheter, initial encounter: Secondary | ICD-10-CM | POA: Insufficient documentation

## 2015-07-05 DIAGNOSIS — M1990 Unspecified osteoarthritis, unspecified site: Secondary | ICD-10-CM | POA: Diagnosis not present

## 2015-07-05 DIAGNOSIS — I2510 Atherosclerotic heart disease of native coronary artery without angina pectoris: Secondary | ICD-10-CM | POA: Insufficient documentation

## 2015-07-05 DIAGNOSIS — Z880 Allergy status to penicillin: Secondary | ICD-10-CM | POA: Diagnosis not present

## 2015-07-05 DIAGNOSIS — Z794 Long term (current) use of insulin: Secondary | ICD-10-CM | POA: Insufficient documentation

## 2015-07-05 DIAGNOSIS — E785 Hyperlipidemia, unspecified: Secondary | ICD-10-CM | POA: Diagnosis not present

## 2015-07-05 DIAGNOSIS — Z8673 Personal history of transient ischemic attack (TIA), and cerebral infarction without residual deficits: Secondary | ICD-10-CM | POA: Diagnosis not present

## 2015-07-05 DIAGNOSIS — Z87891 Personal history of nicotine dependence: Secondary | ICD-10-CM | POA: Diagnosis not present

## 2015-07-05 DIAGNOSIS — Z7952 Long term (current) use of systemic steroids: Secondary | ICD-10-CM | POA: Insufficient documentation

## 2015-07-05 DIAGNOSIS — Y846 Urinary catheterization as the cause of abnormal reaction of the patient, or of later complication, without mention of misadventure at the time of the procedure: Secondary | ICD-10-CM | POA: Diagnosis not present

## 2015-07-05 DIAGNOSIS — Z8551 Personal history of malignant neoplasm of bladder: Secondary | ICD-10-CM | POA: Diagnosis not present

## 2015-07-05 DIAGNOSIS — J449 Chronic obstructive pulmonary disease, unspecified: Secondary | ICD-10-CM | POA: Diagnosis not present

## 2015-07-05 DIAGNOSIS — Z79899 Other long term (current) drug therapy: Secondary | ICD-10-CM | POA: Insufficient documentation

## 2015-07-05 DIAGNOSIS — I10 Essential (primary) hypertension: Secondary | ICD-10-CM | POA: Diagnosis not present

## 2015-07-05 DIAGNOSIS — Z872 Personal history of diseases of the skin and subcutaneous tissue: Secondary | ICD-10-CM | POA: Insufficient documentation

## 2015-07-05 DIAGNOSIS — E119 Type 2 diabetes mellitus without complications: Secondary | ICD-10-CM | POA: Insufficient documentation

## 2015-07-05 DIAGNOSIS — Z8546 Personal history of malignant neoplasm of prostate: Secondary | ICD-10-CM | POA: Insufficient documentation

## 2015-07-05 DIAGNOSIS — T83511A Infection and inflammatory reaction due to indwelling urethral catheter, initial encounter: Secondary | ICD-10-CM

## 2015-07-05 DIAGNOSIS — Z8619 Personal history of other infectious and parasitic diseases: Secondary | ICD-10-CM | POA: Insufficient documentation

## 2015-07-05 DIAGNOSIS — Z7951 Long term (current) use of inhaled steroids: Secondary | ICD-10-CM | POA: Diagnosis not present

## 2015-07-05 DIAGNOSIS — N390 Urinary tract infection, site not specified: Secondary | ICD-10-CM

## 2015-07-05 DIAGNOSIS — Z7982 Long term (current) use of aspirin: Secondary | ICD-10-CM | POA: Diagnosis not present

## 2015-07-05 DIAGNOSIS — K219 Gastro-esophageal reflux disease without esophagitis: Secondary | ICD-10-CM | POA: Insufficient documentation

## 2015-07-05 LAB — URINALYSIS, ROUTINE W REFLEX MICROSCOPIC
BILIRUBIN URINE: NEGATIVE
GLUCOSE, UA: NEGATIVE mg/dL
KETONES UR: NEGATIVE mg/dL
Nitrite: POSITIVE — AB
PROTEIN: 30 mg/dL — AB
Specific Gravity, Urine: 1.018 (ref 1.005–1.030)
UROBILINOGEN UA: 0.2 mg/dL (ref 0.0–1.0)
pH: 5.5 (ref 5.0–8.0)

## 2015-07-05 LAB — CBC WITH DIFFERENTIAL/PLATELET
BASOS ABS: 0 10*3/uL (ref 0.0–0.1)
BASOS PCT: 0 %
EOS ABS: 0.9 10*3/uL — AB (ref 0.0–0.7)
EOS PCT: 13 %
HCT: 30.4 % — ABNORMAL LOW (ref 39.0–52.0)
Hemoglobin: 10.5 g/dL — ABNORMAL LOW (ref 13.0–17.0)
LYMPHS PCT: 16 %
Lymphs Abs: 1.1 10*3/uL (ref 0.7–4.0)
MCH: 34.7 pg — ABNORMAL HIGH (ref 26.0–34.0)
MCHC: 34.5 g/dL (ref 30.0–36.0)
MCV: 100.3 fL — AB (ref 78.0–100.0)
MONO ABS: 0.7 10*3/uL (ref 0.1–1.0)
Monocytes Relative: 10 %
Neutro Abs: 4.4 10*3/uL (ref 1.7–7.7)
Neutrophils Relative %: 61 %
PLATELETS: 120 10*3/uL — AB (ref 150–400)
RBC: 3.03 MIL/uL — ABNORMAL LOW (ref 4.22–5.81)
RDW: 14.4 % (ref 11.5–15.5)
WBC: 7.1 10*3/uL (ref 4.0–10.5)

## 2015-07-05 LAB — URINE MICROSCOPIC-ADD ON

## 2015-07-05 LAB — BASIC METABOLIC PANEL
ANION GAP: 5 (ref 5–15)
BUN: 40 mg/dL — ABNORMAL HIGH (ref 6–20)
CALCIUM: 8.7 mg/dL — AB (ref 8.9–10.3)
CO2: 26 mmol/L (ref 22–32)
Chloride: 106 mmol/L (ref 101–111)
Creatinine, Ser: 1.1 mg/dL (ref 0.61–1.24)
GFR calc Af Amer: >60 mL/min (ref >60)
GFR, EST NON AFRICAN AMERICAN: 56 mL/min — AB (ref >60)
GLUCOSE: 118 mg/dL — AB (ref 65–99)
Potassium: 3.7 mmol/L (ref 3.5–5.1)
SODIUM: 137 mmol/L (ref 135–145)

## 2015-07-05 MED ORDER — CIPROFLOXACIN HCL 500 MG PO TABS: 500.0000 mg | ORAL_TABLET | Freq: Two times a day (BID) | ORAL | Status: DC

## 2015-07-05 NOTE — Discharge Instructions (Signed)
Catheter-Associated Urinary Tract Infection FAQs What is "catheter-associated urinary tract infection"? A urinary tract infection (also called "UTI") is an infection in the urinary system, which includes the bladder (which stores the urine) and the kidneys (which filter the blood to make urine). Germs (for example, bacteria or yeasts) do not normally live in these areas; but if germs are introduced, an infection can occur. If you have a urinary catheter, germs can travel along the catheter and cause an infection in your bladder or your kidney; in that case it is called a catheter-associated urinary tract infection (or "CA-UTI").  What is a urinary catheter? A urinary catheter is a thin tube placed in the bladder to drain urine. Urine drains through the tube into a bag that collects the urine. A urinary catheter may be used:  If you are not able to urinate on your own  To measure the amount of urine that you make, for example, during intensive care  During and after some types of surgery  During some tests of the kidneys and bladder People with urinary catheters have a much higher chance of getting a urinary tract infection than people who don't have a catheter. How do I get a catheter-associated urinary tract infection (CA-UTI)? If germs enter the urinary tract, they may cause an infection. Many of the germs that cause a catheter-associated urinary tract infection are common germs found in your intestines that do not usually cause an infection there. Germs can enter the urinary tract when the catheter is being put in or while the catheter remains in the bladder.  What are the symptoms of a urinary tract infection? Some of the common symptoms of a urinary tract infection are:  Burning or pain in the lower abdomen (that is, below the stomach)  Fever  Bloody urine may be a sign of infection, but is also caused by other problems  Burning during urination or an increase in the frequency of  urination after the catheter is removed. Sometimes people with catheter-associated urinary tract infections do not have these symptoms of infection. Can catheter-associated urinary tract infections be treated? Yes, most catheter-associated urinary tract infections can be treated with antibiotics and removal or change of the catheter. Your doctor will determine which antibiotic is best for you.  What are some of the things that hospitals are doing to prevent catheter-associated urinary tract infections? To prevent urinary tract infections, doctors and nurses take the following actions.  Catheter insertion  External catheters in men (these look like condoms and are placed over the penis rather than into the penis)  Putting a temporary catheter in to drain the urine and removing it right away. This is called intermittent urethral catheterization. Catheter care What can I do to help prevent catheter-associated urinary tract infections if I have a catheter?  Always clean your hands before and after doing catheter care.  Always keep your urine bag below the level of your bladder.  Do not tug or pull on the tubing.  Do not twist or kink the catheter tubing.  Ask your healthcare provider each day if you still need the catheter. What do I need to do when I go home from the hospital?  If you will be going home with a catheter, your doctor or nurse should explain everything you need to know about taking care of the catheter. Make sure you understand how to care for it before you leave the hospital.  If you develop any of the symptoms of a urinary  tract infection, such as burning or pain in the lower abdomen, fever, or an increase in the frequency of urination, contact your doctor or nurse immediately.  Before you go home, make sure you know who to contact if you have questions or problems after you get home. If you have questions, please ask your doctor or nurse. Developed and co-sponsored by [employer] for [employer] [phone]); Infectious Diseases Society of [place] (IDSA); [place]; Association for Professionals in Infection Control and Epidemiology (APIC); Centers for Disease Control and Prevention (CDC); and [employer].   This information is not intended to replace advice given to you by your health care provider. Make sure you discuss any questions you have with your health care provider.   Document Released: 06/10/2012 Document Revised: 01/31/2015 Document Reviewed: 11/30/2014 Elsevier Interactive Patient Education [employer].

## 2015-07-05 NOTE — ED Notes (Signed)
Pt c/o urinary retention starting yesterday.  Denies pain.  Pt's wife reports that the Pt had is indwelling foley catheter changed yesterday.  Sts "it was working good until she changed it yesterday."  Sts home health come out and irrigated the foley earlier today, w/ "very little" output.  Hx of prostate CA.

## 2015-07-05 NOTE — ED Provider Notes (Signed)
CSN: [id number]     Arrival date & time 07/05/15  1437 History   First MD Initiated Contact with Patient 07/05/15 1504     Chief Complaint  Patient presents with  . Urinary Retention  . Catheter Issue      (Consider location/radiation/quality/duration/timing/severity/associated sxs/prior Treatment) Patient is a 81 y.o. male presenting with male genitourinary complaint. The history is provided by the patient.  Male GU Problem Presenting symptoms comment:  Decreased urine in foley bag Context: spontaneously   Relieved by:  Nothing Worsened by:  Nothing tried Ineffective treatments: flushing foley. Associated symptoms: urinary retention   Associated symptoms: no abdominal pain, no fever, no penile redness, no penile swelling and no vomiting   Risk factors comment:  H/o prostate ca   Past Medical History  Diagnosis Date  . Hypertension   . Diabetes mellitus without complication ([place])   . Arthritis   . Sciatica   . GERD (gastroesophageal reflux disease)   . Urge incontinence   . Vertigo   . Hyperlipidemia   . ED (erectile dysfunction)   . Gastric polyposis   . Osteoporosis   . Decreased libido   . Depression   . Hepatitis A   . Stroke ([place])   . History of blood transfusion     1946  . OA (osteoarthritis)   . Bladder cancer ([place])   . Prostate cancer ([place])     S/P prostatectomy; Lung metastasis  . CAD (coronary artery disease) 1996    stent to the LAD   . Hyperlipidemia   . Type 2 diabetes mellitus ([place])   . Ejection fraction   . COPD (chronic obstructive pulmonary disease) ([place])   . Ulcer of left heel [facility])    Past Surgical History  Procedure Laterality Date  . Carpal tunnel release    . Cataract extraction w/ intraocular lens  implant, bilateral  1998  . Prostatectomy    . Urinary sphincter implant    . Urinary sphincter implant revision    . Appendectomy    . Left hip surgery      Donated bone for bone graft to arm  . Penile prosthesis implant      S/P  removal and re-implantation of new prosthesis  . Middle ear surgery    . Finger surgery      Left and right  . Hernia repair    . Bladder surgery    . Right arm bone graft      Pathological fracture  . Cardiac catheterization  11/27/2001    patent stent with mild in-stent restenosis.  . Myoview  06/12/2009    Persantine myoview EF 76%; Normal myoview  . Coronary angioplasty  1996    stenting to the LAD in [place].    Family History  Problem Relation Age of Onset  . Heart failure Mother   . Heart disease Mother   . Bladder Cancer Father   . Pancreatic cancer Sister   . Lung cancer Sister   . Breast cancer Daughter   . Liver disease Son    Social History  Substance Use Topics  . Smoking status: Former Research scientist (life sciences)  . Smokeless tobacco: Never Used  . Alcohol Use: No    Review of Systems  Constitutional: Negative for fever.  Gastrointestinal: Negative for vomiting and abdominal pain.  Genitourinary: Negative for penile swelling.  All other systems reviewed and are negative.     Allergies  Albuterol sulfate hfa; Adhesive; Albuterol; Contrast media; Ioxaglate; Lactose; Metrizamide;  Penicillins; Sulfa drugs cross reactors; Budesonide-formoterol fumarate; and Sulfamethoxazole  Home Medications   Prior to Admission medications   Medication Sig Start Date End Date Taking? Authorizing Provider  amLODipine (NORVASC) 10 MG tablet Take 10 mg by mouth daily.   Yes Historical Provider, MD  aspirin 81 MG tablet Take 81 mg by mouth daily. For heart theraphy   Yes Historical Provider, MD  bicalutamide (CASODEX) 50 MG tablet Take 50 mg by mouth daily.   Yes Historical Provider, MD  Calcium Carbonate-Vitamin D (CALCIUM 600+D HIGH POTENCY) 600-400 MG-UNIT per tablet Take 1 tablet by mouth 2 (two) times daily.     Yes Historical Provider, MD  camphor-menthol Timoteo Ace) lotion Apply 1 application topically daily as needed for itching.   Yes Historical Provider, MD  CRANBERRY CONCENTRATE PO Take  1 capsule by mouth 3 (three) times daily.    Yes Historical Provider, MD  cycloSPORINE (RESTASIS) 0.05 % ophthalmic emulsion Place 1 drop into both eyes 2 (two) times daily.   Yes Historical Provider, MD  fluticasone (FLONASE) 50 MCG/ACT nasal spray Place 2 sprays into the nose 2 (two) times daily as needed for allergies or rhinitis.    Yes Historical Provider, MD  gabapentin (NEURONTIN) 100 MG capsule Take 1-3 capsules by mouth at bedtime as needed (for itching).  02/24/15  Yes Historical Provider, MD  insulin aspart (NOVOLOG) 100 UNIT/ML injection Inject 0-6 Units into the skin 3 (three) times daily with meals. 0-150=0; 151-200=2u; 201-250=3u; 251-300=4u; 301-350=5u; over 350 =6u prior to meals. With breakfast and lunch. 01/18/13  Yes Gerlene Fee, NP  insulin glargine (LANTUS) 100 UNIT/ML injection Inject 18 Units into the skin daily with breakfast.   Yes Historical Provider, MD  lipase/protease/amylase (CREON-12/PANCREASE) 12000 UNITS CPEP Take 1 capsule by mouth 3 (three) times daily. For AKI   Yes Historical Provider, MD  metoprolol succinate (TOPROL-XL) 25 MG 24 hr tablet Take 12.5 mg by mouth daily. For HTN   Yes Historical Provider, MD  Multiple Vitamins-Minerals (HAIR/SKIN/NAILS) TABS Take 1 tablet by mouth 3 (three) times daily.   Yes Historical Provider, MD  Multiple Vitamins-Minerals (MULTIVITAMINS THER. W/MINERALS) TABS Take 1 tablet by mouth every morning.    Yes Historical Provider, MD  nitroGLYCERIN (NITROSTAT) 0.4 MG SL tablet Place 0.4 mg under the tongue every 5 (five) minutes as needed for chest pain.   Yes Historical Provider, MD  omeprazole (PRILOSEC) 20 MG capsule Take 20 mg by mouth every other day. For GERD   Yes Historical Provider, MD  sertraline (ZOLOFT) 100 MG tablet Take 200 mg by mouth daily. For depression   Yes Historical Provider, MD  silver sulfADIAZINE (SILVADENE) 1 % cream Apply 1 application topically 2 (two) times daily. 10/25/14 10/25/15 Yes Historical Provider,  MD  simvastatin (ZOCOR) 20 MG tablet Take 10 mg by mouth every other day. Take 1/2 tablet =10 mg for HLD.   Yes Historical Provider, MD  traMADol (ULTRAM) 50 MG tablet Take 1 tablet (50 mg total) by mouth every 6 (six) hours as needed for moderate pain or severe pain. 05/24/15  Yes Ripudeep Krystal Eaton, MD  triamcinolone ointment (KENALOG) 0.1 % Apply 1 application topically 2 (two) times daily. 10/25/14 10/25/15 Yes Historical Provider, MD  ciprofloxacin (CIPRO) 500 MG tablet Take 1 tablet (500 mg total) by mouth 2 (two) times daily. 07/05/15   Leo Grosser, MD  enzalutamide Gillermina Phy) 40 MG capsule Take 160 mg by mouth daily.    Historical Provider, MD  losartan (COZAAR) 25 MG  tablet Take 1 tablet (25 mg total) by mouth daily. Patient not taking: Reported on 07/05/2015 05/24/15   Ripudeep Krystal Eaton, MD  Melatonin 5 MG TABS Take 5 mg by mouth at bedtime. For insomnia    Historical Provider, MD  polyethylene glycol (MIRALAX / GLYCOLAX) packet Take 17 g by mouth daily. Patient not taking: Reported on 07/05/2015 05/24/15   Ripudeep Krystal Eaton, MD  senna-docusate (SENOKOT S) 8.6-50 MG per tablet Take 1 tablet by mouth at bedtime. For constipation Patient not taking: Reported on 07/05/2015 05/24/15   Ripudeep K Rai, MD   BP 160/74 mmHg  Pulse 65  Temp(Src) 97.9 F (36.6 C) (Oral)  Resp 16  SpO2 99% Physical Exam  Constitutional: He is oriented to person, place, and time. He appears well-developed and well-nourished. No distress.  HENT:  Head: Normocephalic and atraumatic.  Eyes: Conjunctivae are normal.  Neck: Neck supple. No tracheal deviation present.  Cardiovascular: Normal rate, regular rhythm and normal heart sounds.   Pulmonary/Chest: Effort normal and breath sounds normal. No respiratory distress.  Abdominal: Soft. He exhibits no distension. There is no tenderness.  Neurological: He is alert and oriented to person, place, and time.  Skin: Skin is warm and dry.  Psychiatric: He has a normal mood and affect.     ED Course  Procedures (including critical care time) Labs Review Labs Reviewed  URINALYSIS, ROUTINE W REFLEX MICROSCOPIC (NOT AT [facility]) - Abnormal; Notable for the following:    APPearance CLOUDY (*)    Hgb urine dipstick MODERATE (*)    Protein, ur 30 (*)    Nitrite POSITIVE (*)    Leukocytes, UA LARGE (*)    All other components within normal limits  CBC WITH DIFFERENTIAL/PLATELET - Abnormal; Notable for the following:    RBC 3.03 (*)    Hemoglobin 10.5 (*)    HCT 30.4 (*)    MCV 100.3 (*)    MCH 34.7 (*)    Platelets 120 (*)    Eosinophils Absolute 0.9 (*)    All other components within normal limits  BASIC METABOLIC PANEL - Abnormal; Notable for the following:    Glucose, Bld 118 (*)    BUN 40 (*)    Calcium 8.7 (*)    GFR calc non Af Amer 56 (*)    All other components within normal limits  URINE MICROSCOPIC-ADD ON - Abnormal; Notable for the following:    Bacteria, UA MANY (*)    All other components within normal limits    Imaging Review No results found. I have personally reviewed and evaluated these images and lab results as part of my medical decision-making.   EKG Interpretation None      MDM   Final diagnoses:  Urinary tract infection associated with indwelling urethral catheter, initial encounter    81 y.o. male presents with decreased foley output from home with concern for obstruction by family members. Output appropriate here. Screening urine concerning for infection as possible etiology. Will treat with cipro for CAUTI. No leukocytosis and no other signs of sepsis. Plan to follow up with PCP as needed and return precautions discussed for worsening or new concerning symptoms.     Leo Grosser, MD 07/06/15 0100

## 2015-07-05 NOTE — Progress Notes (Signed)
CM assisted pt an wife with updating care [facility] staff about pt being in [facility] ED  Pt now scheduled for d/c home  Cm attempted to call [facility] but no answer at [phone]  Wife to call when they get home

## 2015-07-06 ENCOUNTER — Emergency Department (HOSPITAL_COMMUNITY)
Admission: EM | Admit: 2015-07-06 | Discharge: 2015-07-06 | Disposition: A | Discharge status: Home / Self Care | Payer: Medicare Other | Attending: Emergency Medicine | Admitting: Emergency Medicine

## 2015-07-06 ENCOUNTER — Encounter (HOSPITAL_COMMUNITY): Payer: Self-pay | Admitting: Emergency Medicine

## 2015-07-06 DIAGNOSIS — Z794 Long term (current) use of insulin: Secondary | ICD-10-CM | POA: Diagnosis not present

## 2015-07-06 DIAGNOSIS — Z8546 Personal history of malignant neoplasm of prostate: Secondary | ICD-10-CM | POA: Insufficient documentation

## 2015-07-06 DIAGNOSIS — Z7952 Long term (current) use of systemic steroids: Secondary | ICD-10-CM | POA: Insufficient documentation

## 2015-07-06 DIAGNOSIS — M1990 Unspecified osteoarthritis, unspecified site: Secondary | ICD-10-CM | POA: Insufficient documentation

## 2015-07-06 DIAGNOSIS — Z87438 Personal history of other diseases of male genital organs: Secondary | ICD-10-CM | POA: Insufficient documentation

## 2015-07-06 DIAGNOSIS — Z9861 Coronary angioplasty status: Secondary | ICD-10-CM | POA: Insufficient documentation

## 2015-07-06 DIAGNOSIS — Z79899 Other long term (current) drug therapy: Secondary | ICD-10-CM | POA: Diagnosis not present

## 2015-07-06 DIAGNOSIS — K219 Gastro-esophageal reflux disease without esophagitis: Secondary | ICD-10-CM | POA: Insufficient documentation

## 2015-07-06 DIAGNOSIS — Y846 Urinary catheterization as the cause of abnormal reaction of the patient, or of later complication, without mention of misadventure at the time of the procedure: Secondary | ICD-10-CM | POA: Diagnosis not present

## 2015-07-06 DIAGNOSIS — E119 Type 2 diabetes mellitus without complications: Secondary | ICD-10-CM | POA: Diagnosis not present

## 2015-07-06 DIAGNOSIS — T83031A Leakage of indwelling urethral catheter, initial encounter: Secondary | ICD-10-CM | POA: Insufficient documentation

## 2015-07-06 DIAGNOSIS — E785 Hyperlipidemia, unspecified: Secondary | ICD-10-CM | POA: Diagnosis not present

## 2015-07-06 DIAGNOSIS — I2510 Atherosclerotic heart disease of native coronary artery without angina pectoris: Secondary | ICD-10-CM | POA: Diagnosis not present

## 2015-07-06 DIAGNOSIS — J449 Chronic obstructive pulmonary disease, unspecified: Secondary | ICD-10-CM | POA: Diagnosis not present

## 2015-07-06 DIAGNOSIS — Z8673 Personal history of transient ischemic attack (TIA), and cerebral infarction without residual deficits: Secondary | ICD-10-CM | POA: Diagnosis not present

## 2015-07-06 DIAGNOSIS — I10 Essential (primary) hypertension: Secondary | ICD-10-CM | POA: Insufficient documentation

## 2015-07-06 DIAGNOSIS — Z8619 Personal history of other infectious and parasitic diseases: Secondary | ICD-10-CM | POA: Diagnosis not present

## 2015-07-06 DIAGNOSIS — Z8551 Personal history of malignant neoplasm of bladder: Secondary | ICD-10-CM | POA: Diagnosis not present

## 2015-07-06 DIAGNOSIS — Z7982 Long term (current) use of aspirin: Secondary | ICD-10-CM | POA: Diagnosis not present

## 2015-07-06 DIAGNOSIS — F329 Major depressive disorder, single episode, unspecified: Secondary | ICD-10-CM | POA: Insufficient documentation

## 2015-07-06 DIAGNOSIS — Z872 Personal history of diseases of the skin and subcutaneous tissue: Secondary | ICD-10-CM | POA: Insufficient documentation

## 2015-07-06 DIAGNOSIS — Z880 Allergy status to penicillin: Secondary | ICD-10-CM | POA: Diagnosis not present

## 2015-07-06 DIAGNOSIS — Z7951 Long term (current) use of inhaled steroids: Secondary | ICD-10-CM | POA: Insufficient documentation

## 2015-07-06 DIAGNOSIS — T839XXA Unspecified complication of genitourinary prosthetic device, implant and graft, initial encounter: Secondary | ICD-10-CM

## 2015-07-06 DIAGNOSIS — Z87891 Personal history of nicotine dependence: Secondary | ICD-10-CM | POA: Insufficient documentation

## 2015-07-06 LAB — URINALYSIS, ROUTINE W REFLEX MICROSCOPIC
Bilirubin Urine: NEGATIVE
GLUCOSE, UA: NEGATIVE mg/dL
Ketones, ur: NEGATIVE mg/dL
Nitrite: NEGATIVE
PROTEIN: 100 mg/dL — AB
SPECIFIC GRAVITY, URINE: 1.019 (ref 1.005–1.030)
Urobilinogen, UA: 0.2 mg/dL (ref 0.0–1.0)
pH: 5.5 (ref 5.0–8.0)

## 2015-07-06 LAB — URINE MICROSCOPIC-ADD ON

## 2015-07-06 NOTE — ED Notes (Signed)
Pt was asked if wanted to have foley catheter changed in triage room or wait in lobby until treatment room in back becomes available.   Pt and wife both stated that they wanted to go ahead in triage room and have foley catheter changed.

## 2015-07-06 NOTE — ED Notes (Signed)
Leg bag applied. Pt and wife educated for him to drink lots of fluids and take antibiotic until completion. Knows if catheter continues to leak to follow up with urologist. No other c/c.

## 2015-07-06 NOTE — ED Notes (Signed)
Pt states that he was seen here yesterday for foley catheter not draining and diagnosed with UTI and sent home after foley was draining fine.  This am pt has no urine in catheter bag but having wet pants due to leakage.  Pt denies any pain.

## 2015-07-06 NOTE — Discharge Instructions (Signed)
Drink plenty of fluids - your testing here shows no urinary infection today - continue the antibiotic that you were prescribed  Please obtain all of your results from medical records or have your doctors office obtain the results - share them with your doctor - you should be seen at your doctors office in the next 2 days. Call today to arrange your follow up. Take the medications as prescribed. Please review all of the medicines and only take them if you do not have an allergy to them. Please be aware that if you are taking birth control pills, taking other prescriptions, ESPECIALLY ANTIBIOTICS may make the birth control ineffective - if this is the case, either do not engage in sexual activity or use alternative methods of birth control such as condoms until you have finished the medicine and your family doctor says it is OK to restart them. If you are on a blood thinner such as COUMADIN, be aware that any other medicine that you take may cause the coumadin to either work too much, or not enough - you should have your coumadin level rechecked in next 7 days if this is the case.  ?  It is also a possibility that you have an allergic reaction to any of the medicines that you have been prescribed - Everybody reacts differently to medications and while MOST people have no trouble with most medicines, you may have a reaction such as nausea, vomiting, rash, swelling, shortness of breath. If this is the case, please stop taking the medicine immediately and contact your physician.  ?  You should return to the ER if you develop severe or worsening symptoms.

## 2015-07-06 NOTE — ED Provider Notes (Addendum)
CSN: [id number]     Arrival date & time 07/06/15  1312 History  By signing my name below, I, Starleen Arms, attest that this documentation has been prepared under the direction and in the presence of Noemi Chapel, MD. Electronically Signed: Starleen Arms, ED Scribe. 07/06/2015. 2:13 PM.    Chief Complaint  Patient presents with  . leaking catheter    The history is provided by the patient and the spouse. No language interpreter was used.   HPI Comments: Guy [name] is a 81 y.o. male who was seen yesterday in the ED for catheter change due to suspected obstruction.  At that time, UA was sent and cipro started.  The catheter drained overnight appropriately.  The catheter was changed to a leg bag this morning with scant drainge over the last 3 hrs.  No abdominal pain, fevers, vomiting, other symtpoms.  Catheter appears to be leaking at the urethra; peristant.  Nothing makes it better or worse.    Sx are constant, mild and not associated with fevers.  Past Medical History  Diagnosis Date  . Hypertension   . Diabetes mellitus without complication ([place])   . Arthritis   . Sciatica   . GERD (gastroesophageal reflux disease)   . Urge incontinence   . Vertigo   . Hyperlipidemia   . ED (erectile dysfunction)   . Gastric polyposis   . Osteoporosis   . Decreased libido   . Depression   . Hepatitis A   . Stroke ([place])   . History of blood transfusion     1946  . OA (osteoarthritis)   . Bladder cancer ([place])   . Prostate cancer ([place])     S/P prostatectomy; Lung metastasis  . CAD (coronary artery disease) 1996    stent to the LAD   . Hyperlipidemia   . Type 2 diabetes mellitus ([place])   . Ejection fraction   . COPD (chronic obstructive pulmonary disease) ([place])   . Ulcer of left heel [facility]    Past Surgical History  Procedure Laterality Date  . Carpal tunnel release    . Cataract extraction w/ intraocular lens  implant, bilateral  1998  . Prostatectomy    . Urinary sphincter implant     . Urinary sphincter implant revision    . Appendectomy    . Left hip surgery      Donated bone for bone graft to arm  . Penile prosthesis implant      S/P removal and re-implantation of new prosthesis  . Middle ear surgery    . Finger surgery      Left and right  . Hernia repair    . Bladder surgery    . Right arm bone graft      Pathological fracture  . Cardiac catheterization  11/27/2001    patent stent with mild in-stent restenosis.  . Myoview  06/12/2009    Persantine myoview EF 76%; Normal myoview  . Coronary angioplasty  1996    stenting to the LAD in [place].    Family History  Problem Relation Age of Onset  . Heart failure Mother   . Heart disease Mother   . Bladder Cancer Father   . Pancreatic cancer Sister   . Lung cancer Sister   . Breast cancer Daughter   . Liver disease Son    Social History  Substance Use Topics  . Smoking status: Former Research scientist (life sciences)  . Smokeless tobacco: Never Used  . Alcohol Use: No  Review of Systems  All other systems reviewed and are negative.     Allergies  Albuterol sulfate hfa; Adhesive; Albuterol; Contrast media; Ioxaglate; Lactose; Metrizamide; Penicillins; Sulfa drugs cross reactors; Budesonide-formoterol fumarate; and Sulfamethoxazole  Home Medications   Prior to Admission medications   Medication Sig Start Date End Date Taking? Authorizing Provider  amLODipine (NORVASC) 10 MG tablet Take 10 mg by mouth daily.    Historical Provider, MD  aspirin 81 MG tablet Take 81 mg by mouth daily. For heart theraphy    Historical Provider, MD  bicalutamide (CASODEX) 50 MG tablet Take 50 mg by mouth daily.    Historical Provider, MD  Calcium Carbonate-Vitamin D (CALCIUM 600+D HIGH POTENCY) 600-400 MG-UNIT per tablet Take 1 tablet by mouth 2 (two) times daily.      Historical Provider, MD  camphor-menthol Timoteo Ace) lotion Apply 1 application topically daily as needed for itching.    Historical Provider, MD  ciprofloxacin (CIPRO) 500 MG  tablet Take 1 tablet (500 mg total) by mouth 2 (two) times daily. 07/05/15   Leo Grosser, MD  CRANBERRY CONCENTRATE PO Take 1 capsule by mouth 3 (three) times daily.     Historical Provider, MD  cycloSPORINE (RESTASIS) 0.05 % ophthalmic emulsion Place 1 drop into both eyes 2 (two) times daily.    Historical Provider, MD  enzalutamide Gillermina Phy) 40 MG capsule Take 160 mg by mouth daily.    Historical Provider, MD  fluticasone (FLONASE) 50 MCG/ACT nasal spray Place 2 sprays into the nose 2 (two) times daily as needed for allergies or rhinitis.     Historical Provider, MD  gabapentin (NEURONTIN) 100 MG capsule Take 1-3 capsules by mouth at bedtime as needed (for itching).  02/24/15   Historical Provider, MD  insulin aspart (NOVOLOG) 100 UNIT/ML injection Inject 0-6 Units into the skin 3 (three) times daily with meals. 0-150=0; 151-200=2u; 201-250=3u; 251-300=4u; 301-350=5u; over 350 =6u prior to meals. With breakfast and lunch. 01/18/13   Gerlene Fee, NP  insulin glargine (LANTUS) 100 UNIT/ML injection Inject 18 Units into the skin daily with breakfast.    Historical Provider, MD  lipase/protease/amylase (CREON-12/PANCREASE) 12000 UNITS CPEP Take 1 capsule by mouth 3 (three) times daily. For AKI    Historical Provider, MD  losartan (COZAAR) 25 MG tablet Take 1 tablet (25 mg total) by mouth daily. Patient not taking: Reported on 07/05/2015 05/24/15   Ripudeep Krystal Eaton, MD  Melatonin 5 MG TABS Take 5 mg by mouth at bedtime. For insomnia    Historical Provider, MD  metoprolol succinate (TOPROL-XL) 25 MG 24 hr tablet Take 12.5 mg by mouth daily. For HTN    Historical Provider, MD  Multiple Vitamins-Minerals (HAIR/SKIN/NAILS) TABS Take 1 tablet by mouth 3 (three) times daily.    Historical Provider, MD  Multiple Vitamins-Minerals (MULTIVITAMINS THER. W/MINERALS) TABS Take 1 tablet by mouth every morning.     Historical Provider, MD  nitroGLYCERIN (NITROSTAT) 0.4 MG SL tablet Place 0.4 mg under the tongue every 5  (five) minutes as needed for chest pain.    Historical Provider, MD  omeprazole (PRILOSEC) 20 MG capsule Take 20 mg by mouth every other day. For GERD    Historical Provider, MD  polyethylene glycol (MIRALAX / GLYCOLAX) packet Take 17 g by mouth daily. Patient not taking: Reported on 07/05/2015 05/24/15   Ripudeep Krystal Eaton, MD  senna-docusate (SENOKOT S) 8.6-50 MG per tablet Take 1 tablet by mouth at bedtime. For constipation Patient not taking: Reported on 07/05/2015 05/24/15  Ripudeep Krystal Eaton, MD  sertraline (ZOLOFT) 100 MG tablet Take 200 mg by mouth daily. For depression    Historical Provider, MD  silver sulfADIAZINE (SILVADENE) 1 % cream Apply 1 application topically 2 (two) times daily. 10/25/14 10/25/15  Historical Provider, MD  simvastatin (ZOCOR) 20 MG tablet Take 10 mg by mouth every other day. Take 1/2 tablet =10 mg for HLD.    Historical Provider, MD  traMADol (ULTRAM) 50 MG tablet Take 1 tablet (50 mg total) by mouth every 6 (six) hours as needed for moderate pain or severe pain. 05/24/15   Ripudeep Krystal Eaton, MD  triamcinolone ointment (KENALOG) 0.1 % Apply 1 application topically 2 (two) times daily. 10/25/14 10/25/15  Historical Provider, MD   BP 163/76 mmHg  Pulse 61  Temp(Src) 98.5 F (36.9 C) (Oral)  Resp 16  SpO2 99% Physical Exam  Constitutional: He appears well-developed and well-nourished.  HENT:  Head: Normocephalic and atraumatic.  Eyes: Conjunctivae are normal. Right eye exhibits no discharge. Left eye exhibits no discharge.  Cardiovascular: Normal rate and regular rhythm.   Pulmonary/Chest: Effort normal. No respiratory distress.  Abdominal:  Soft nontender abdomen, no masses  Genitourinary:  Urethral catheter well-seated in the urethral orifice, no drainage around the meatus  Neurological: He is alert. Coordination normal.  Skin: Skin is warm and dry. No rash noted. He is not diaphoretic. No erythema.  Psychiatric: He has a normal mood and affect.  Nursing note and vitals  reviewed.   ED Course  Procedures (including critical care time)  DIAGNOSTIC STUDIES: Oxygen Saturation is 100% on RA, normal by my interpretation.    COORDINATION OF CARE:  2:13 PM Will change catheter.  Patient and wife acknowledges and agrees with plan.    Labs Review Labs Reviewed  URINALYSIS, ROUTINE W REFLEX MICROSCOPIC (NOT AT [facility]) - Abnormal; Notable for the following:    APPearance CLOUDY (*)    Hgb urine dipstick LARGE (*)    Protein, ur 100 (*)    Leukocytes, UA SMALL (*)    All other components within normal limits  URINE MICROSCOPIC-ADD ON - Abnormal; Notable for the following:    Bacteria, UA FEW (*)    All other components within normal limits  URINE CULTURE    Imaging Review No results found. I have personally reviewed and evaluated these images and lab results as part of my medical decision-making.    MDM   Final diagnoses:  Foley catheter problem, initial encounter [facility])    The patient has some urinary obstruction from the catheter, the etiology is unclear, he has no signs of infection including tachycardia hypotension hypoxia or fever. We'll perform urinalysis and culture to ensure that there is improvement with the medications, change urinary catheter.  When this was removed, there was a large blood clot that seemed to be obstructing the catheter tip. Fresh catheter placed, urine sample will be drawn from fresh catheter.  No urine in bladder on bladder scan - foley filling appropriately - requested pushing fluids as method of increased urine production as may be slightly dehdyrated  UA without obvious infection Culture sent Pt stable for d/c   I, Keyante Durio D, personally performed the services described in this documentation. All medical record entries made by the scribe were at my direction and in my presence.  I have reviewed the chart and discharge instructions and agree that the record reflects my personal performance and is accurate and  complete. Lashon Beringer D.  07/06/2015. 4:58 PM.  Noemi Chapel, MD 07/06/15 1656  Noemi Chapel, MD 07/06/15 [phone]

## 2015-07-06 NOTE — Progress Notes (Signed)
This pt was seen on 07/05/15 by this ED Cm  Pt continues to be followed by [facility] home [facility] services  Pt arriving to ED prior to being seen by home health staff Cm contacted [facility] home health coordinator in house to update on pt return to [facility] ED for concerns with foley Requesting pt be seen by home health staff for education of foley care  1509 pt states foley was leaking today  Has been replaced in [facility] ED

## 2015-07-06 NOTE — ED Notes (Signed)
Attempted flushing catheter with sterile normal saline- urine returned.

## 2015-07-07 LAB — URINE CULTURE

## 2015-08-18 ENCOUNTER — Encounter: Payer: Self-pay | Admitting: Cardiovascular Disease

## 2015-08-18 ENCOUNTER — Ambulatory Visit (INDEPENDENT_AMBULATORY_CARE_PROVIDER_SITE_OTHER): Payer: Medicare Other | Admitting: Cardiovascular Disease

## 2015-08-18 VITALS — BP 120/60 | HR 56 | Ht 66.0 in | Wt 113.0 lb

## 2015-08-18 DIAGNOSIS — I10 Essential (primary) hypertension: Secondary | ICD-10-CM

## 2015-08-18 DIAGNOSIS — I2510 Atherosclerotic heart disease of native coronary artery without angina pectoris: Secondary | ICD-10-CM

## 2015-08-18 DIAGNOSIS — E785 Hyperlipidemia, unspecified: Secondary | ICD-10-CM

## 2015-08-18 NOTE — Assessment & Plan Note (Signed)
History of coronary artery disease status post LAD stenting in 1996 in [place]. I catheterized him 11/27/01 revealing patent LAD stent with mild in-stent restenosis. His last Myoview performed 06/12/09 was nonischemic. He denies chest pain or shortness of breath.

## 2015-08-18 NOTE — Patient Instructions (Signed)
Your physician wants you to follow-up in: 1 year with Dr Berry. You will receive a reminder letter in the mail two months in advance. If you don't receive a letter, please call our office to schedule the follow-up appointment.  

## 2015-08-18 NOTE — Assessment & Plan Note (Signed)
History of hypertension blood pressure measured at 120/60. He is on amlodipine, metoprolol and losartan. Continue current meds at current dosing

## 2015-08-18 NOTE — Assessment & Plan Note (Signed)
History of hyperlipidemia followed by his PCP

## 2015-08-18 NOTE — Progress Notes (Signed)
08/18/2015 Guy [name]   [birth year]  [id number]  Primary Physician [name], MD Primary Cardiologist: Lorretta Harp MD [name]    HPI:  The patient is a very pleasant 81 year old thin and frail appearing married Caucasian male, father of 70, grandfather of 4 great grandchildren who is accompanied by his wife today who is also a patient of mine. I last saw him 03/14/15.[name] He has a history of CAD status post remote LAD stenting in 1996 in [place]. I catheterized him November 27, 2001 revealing a patent LAD stent with mild in-stent restenosis. His last Myoview performed in March of 2008 was nonischemic, as was one on June 12, 2009. His other problems include hypertension, hyperlipidemia, and non-insulin-requiring diabetes. He does have metastatic prostate cancer involving his lungs and ribs and has had multiple operations for these. He gets occasionally chest pain which is nitrate responsive. Since I saw him last he had hospitalizations for fractured vertebrae requiring kyphoplasty as well as urinary tract infection and surgery on his urethra. I last saw him 03/14/15. He has developed recurrent prostate cancer is being treated at the [facility]. He is for the most part wheelchair-bound and is extremely weak and cachectic.    Current Outpatient Prescriptions  Medication Sig Dispense Refill  . amLODipine (NORVASC) 10 MG tablet Take 10 mg by mouth daily.    [name] aspirin 81 MG tablet Take 81 mg by mouth daily. For heart theraphy    . bicalutamide (CASODEX) 50 MG tablet Take 50 mg by mouth daily.    . Calcium Carbonate-Vitamin D (CALCIUM 600+D HIGH POTENCY) 600-400 MG-UNIT per tablet Take 1 tablet by mouth 2 (two) times daily.      . camphor-menthol (SARNA) lotion Apply 1 application topically daily as needed for itching.    . ciprofloxacin (CIPRO) 500 MG tablet Take 1 tablet (500 mg total) by mouth 2 (two) times daily. 28 tablet 0  .  CRANBERRY CONCENTRATE PO Take 1 capsule by mouth 3 (three) times daily.     . cycloSPORINE (RESTASIS) 0.05 % ophthalmic emulsion Place 1 drop into both eyes 2 (two) times daily.    . enzalutamide (XTANDI) 40 MG capsule Take 160 mg by mouth daily.    . fluticasone (FLONASE) 50 MCG/ACT nasal spray Place 2 sprays into the nose 2 (two) times daily as needed for allergies or rhinitis.     [name] gabapentin (NEURONTIN) 100 MG capsule Take 1-3 capsules by mouth at bedtime as needed (for itching).     . insulin aspart (NOVOLOG) 100 UNIT/ML injection Inject 0-6 Units into the skin 3 (three) times daily with meals. 0-150=0; 151-200=2u; 201-250=3u; 251-300=4u; 301-350=5u; over 350 =6u prior to meals. With breakfast and lunch.    . insulin glargine (LANTUS) 100 UNIT/ML injection Inject 18 Units into the skin daily with breakfast.    . lipase/protease/amylase (CREON-12/PANCREASE) 12000 UNITS CPEP Take 1 capsule by mouth 3 (three) times daily. For AKI    . losartan (COZAAR) 25 MG tablet Take 1 tablet (25 mg total) by mouth daily. 30 tablet 3  . metoprolol succinate (TOPROL-XL) 25 MG 24 hr tablet Take 12.5 mg by mouth daily. For HTN    . Multiple Vitamins-Minerals (HAIR/SKIN/NAILS) TABS Take 1 tablet by mouth 3 (three) times daily.    . Multiple Vitamins-Minerals (MULTIVITAMINS THER. W/MINERALS) TABS Take 1 tablet by mouth every morning.     . nitroGLYCERIN (NITROSTAT) 0.4 MG SL tablet Place 0.4 mg under the tongue  every 5 (five) minutes as needed for chest pain.    [name] omeprazole (PRILOSEC) 20 MG capsule Take 20 mg by mouth every other day. For GERD    . polyethylene glycol (MIRALAX / GLYCOLAX) packet Take 17 g by mouth daily. 30 each 4  . senna-docusate (SENOKOT S) 8.6-50 MG per tablet Take 1 tablet by mouth at bedtime. For constipation 30 tablet 3  . sertraline (ZOLOFT) 100 MG tablet Take 200 mg by mouth daily. For depression    . silver sulfADIAZINE (SILVADENE) 1 % cream Apply 1 application topically 2 (two) times  daily.    . simvastatin (ZOCOR) 20 MG tablet Take 10 mg by mouth every other day. Take 1/2 tablet =10 mg for HLD.    [name] traMADol (ULTRAM) 50 MG tablet Take 1 tablet (50 mg total) by mouth every 6 (six) hours as needed for moderate pain or severe pain. 30 tablet 0  . triamcinolone ointment (KENALOG) 0.1 % Apply 1 application topically 2 (two) times daily.     No current facility-administered medications for this visit.    Allergies  Allergen Reactions  . Albuterol Sulfate Hfa [Kdc:Albuterol] Shortness Of Breath and Swelling  . Adhesive [Tape] Other (See Comments)    REACTION: SKIN BLISTERS  . Albuterol Swelling    Per pt., side of his neck began to swell with nebulized Albuterol in doctor's office.  . Contrast Media [Iodinated Diagnostic Agents] Other (See Comments)    unknown Reaction unknown  . Ioxaglate Other (See Comments)    Reaction unknown  . Lactose     Other reaction(s): GI Upset (intolerance)  . Metrizamide Other (See Comments)    Reaction unknown  . Penicillins Itching and Other (See Comments)    REACTION: ITCHING HANDS Has patient had a PCN reaction causing immediate rash, facial/tongue/throat swelling, SOB or lightheadedness with hypotension: No Has patient had a PCN reaction causing severe rash involving mucus membranes or skin necrosis: No Has patient had a PCN reaction that required hospitalization Yes Has patient had a PCN reaction occurring within the last 10 years: No If all of the above answers are "NO", then may proceed with Cephalosporin use.   . Sulfa Drugs Cross Reactors Hives  . Budesonide-Formoterol Fumarate Rash  . Sulfamethoxazole Rash    Social History   Social History  . Marital Status: Married    Spouse Name: [name]  . Number of Children: 3  . Years of Education: N/A   Occupational History  . Business owner, Press photographer, retired    Social History Main Topics  . Smoking status: Former Research scientist (life sciences)  . Smokeless tobacco: Never Used  . Alcohol Use: No    . Drug Use: No  . Sexual Activity: No   Other Topics Concern  . Not on file   Social History Narrative   Married.  Lives with his wife.  Ambulates with a walker and a cane.     Review of Systems: General: negative for chills, fever, night sweats or weight changes.  Cardiovascular: negative for chest pain, dyspnea on exertion, edema, orthopnea, palpitations, paroxysmal nocturnal dyspnea or shortness of breath Dermatological: negative for rash Respiratory: negative for cough or wheezing Urologic: negative for hematuria Abdominal: negative for nausea, vomiting, diarrhea, bright red blood per rectum, melena, or hematemesis Neurologic: negative for visual changes, syncope, or dizziness All other systems reviewed and are otherwise negative except as noted above.    Blood pressure 120/60, pulse 56, height '5\' 6"'$  (1.676 m), weight 113 lb (51.256 kg).  General appearance: alert and no distress Neck: no adenopathy, no carotid bruit, no JVD, supple, symmetrical, trachea midline and thyroid not enlarged, symmetric, no tenderness/mass/nodules Lungs: clear to auscultation bilaterally Heart: regular rate and rhythm, S1, S2 normal, no murmur, click, rub or gallop Extremities: extremities normal, atraumatic, no cyanosis or edema  EKG not performed today  ASSESSMENT AND PLAN:   Hyperlipidemia History of hyperlipidemia followed by his PCP  Essential hypertension, benign History of hypertension blood pressure measured at 120/60. He is on amlodipine, metoprolol and losartan. Continue current meds at current dosing  Coronary artery disease involving native coronary artery of native heart without angina pectoris History of coronary artery disease status post LAD stenting in 1996 in [place]. I catheterized him 11/27/01 revealing patent LAD stent with mild in-stent restenosis. His last Myoview performed 06/12/09 was nonischemic. He denies chest pain or shortness of breath.      Lorretta Harp MD FACP,FACC,FAHA, [facility] 08/18/2015 11:54 AM

## 2015-10-27 ENCOUNTER — Emergency Department (HOSPITAL_COMMUNITY): Payer: Medicare Other

## 2015-10-27 ENCOUNTER — Encounter (HOSPITAL_COMMUNITY): Payer: Self-pay | Admitting: Nurse Practitioner

## 2015-10-27 ENCOUNTER — Emergency Department (HOSPITAL_COMMUNITY)
Admission: EM | Admit: 2015-10-27 | Discharge: 2015-10-27 | Disposition: A | Discharge status: Home / Self Care | Payer: Medicare Other | Attending: Emergency Medicine | Admitting: Emergency Medicine

## 2015-10-27 DIAGNOSIS — S4991XA Unspecified injury of right shoulder and upper arm, initial encounter: Secondary | ICD-10-CM | POA: Insufficient documentation

## 2015-10-27 DIAGNOSIS — Z8551 Personal history of malignant neoplasm of bladder: Secondary | ICD-10-CM | POA: Insufficient documentation

## 2015-10-27 DIAGNOSIS — F329 Major depressive disorder, single episode, unspecified: Secondary | ICD-10-CM | POA: Insufficient documentation

## 2015-10-27 DIAGNOSIS — S0101XA Laceration without foreign body of scalp, initial encounter: Secondary | ICD-10-CM | POA: Insufficient documentation

## 2015-10-27 DIAGNOSIS — Z87891 Personal history of nicotine dependence: Secondary | ICD-10-CM | POA: Insufficient documentation

## 2015-10-27 DIAGNOSIS — Z87438 Personal history of other diseases of male genital organs: Secondary | ICD-10-CM | POA: Diagnosis not present

## 2015-10-27 DIAGNOSIS — Z9079 Acquired absence of other genital organ(s): Secondary | ICD-10-CM | POA: Insufficient documentation

## 2015-10-27 DIAGNOSIS — I2510 Atherosclerotic heart disease of native coronary artery without angina pectoris: Secondary | ICD-10-CM | POA: Insufficient documentation

## 2015-10-27 DIAGNOSIS — S0181XA Laceration without foreign body of other part of head, initial encounter: Secondary | ICD-10-CM | POA: Diagnosis not present

## 2015-10-27 DIAGNOSIS — E119 Type 2 diabetes mellitus without complications: Secondary | ICD-10-CM | POA: Diagnosis not present

## 2015-10-27 DIAGNOSIS — K219 Gastro-esophageal reflux disease without esophagitis: Secondary | ICD-10-CM | POA: Insufficient documentation

## 2015-10-27 DIAGNOSIS — S199XXA Unspecified injury of neck, initial encounter: Secondary | ICD-10-CM | POA: Insufficient documentation

## 2015-10-27 DIAGNOSIS — Z79899 Other long term (current) drug therapy: Secondary | ICD-10-CM | POA: Diagnosis not present

## 2015-10-27 DIAGNOSIS — Z9861 Coronary angioplasty status: Secondary | ICD-10-CM | POA: Insufficient documentation

## 2015-10-27 DIAGNOSIS — Y93E1 Activity, personal bathing and showering: Secondary | ICD-10-CM | POA: Insufficient documentation

## 2015-10-27 DIAGNOSIS — Z880 Allergy status to penicillin: Secondary | ICD-10-CM | POA: Insufficient documentation

## 2015-10-27 DIAGNOSIS — C7800 Secondary malignant neoplasm of unspecified lung: Secondary | ICD-10-CM | POA: Insufficient documentation

## 2015-10-27 DIAGNOSIS — I10 Essential (primary) hypertension: Secondary | ICD-10-CM | POA: Insufficient documentation

## 2015-10-27 DIAGNOSIS — Z7982 Long term (current) use of aspirin: Secondary | ICD-10-CM | POA: Insufficient documentation

## 2015-10-27 DIAGNOSIS — Z9889 Other specified postprocedural states: Secondary | ICD-10-CM | POA: Diagnosis not present

## 2015-10-27 DIAGNOSIS — Y92121 Bathroom in nursing home as the place of occurrence of the external cause: Secondary | ICD-10-CM | POA: Diagnosis not present

## 2015-10-27 DIAGNOSIS — Z792 Long term (current) use of antibiotics: Secondary | ICD-10-CM | POA: Diagnosis not present

## 2015-10-27 DIAGNOSIS — Z8546 Personal history of malignant neoplasm of prostate: Secondary | ICD-10-CM | POA: Insufficient documentation

## 2015-10-27 DIAGNOSIS — Z8619 Personal history of other infectious and parasitic diseases: Secondary | ICD-10-CM | POA: Insufficient documentation

## 2015-10-27 DIAGNOSIS — Z794 Long term (current) use of insulin: Secondary | ICD-10-CM | POA: Diagnosis not present

## 2015-10-27 DIAGNOSIS — M810 Age-related osteoporosis without current pathological fracture: Secondary | ICD-10-CM | POA: Insufficient documentation

## 2015-10-27 DIAGNOSIS — E785 Hyperlipidemia, unspecified: Secondary | ICD-10-CM | POA: Diagnosis not present

## 2015-10-27 DIAGNOSIS — Z8673 Personal history of transient ischemic attack (TIA), and cerebral infarction without residual deficits: Secondary | ICD-10-CM | POA: Diagnosis not present

## 2015-10-27 DIAGNOSIS — W182XXA Fall in (into) shower or empty bathtub, initial encounter: Secondary | ICD-10-CM | POA: Insufficient documentation

## 2015-10-27 DIAGNOSIS — Y998 Other external cause status: Secondary | ICD-10-CM | POA: Diagnosis not present

## 2015-10-27 DIAGNOSIS — J449 Chronic obstructive pulmonary disease, unspecified: Secondary | ICD-10-CM | POA: Diagnosis not present

## 2015-10-27 DIAGNOSIS — Z8781 Personal history of (healed) traumatic fracture: Secondary | ICD-10-CM | POA: Diagnosis not present

## 2015-10-27 DIAGNOSIS — S0990XA Unspecified injury of head, initial encounter: Secondary | ICD-10-CM | POA: Diagnosis present

## 2015-10-27 DIAGNOSIS — M1990 Unspecified osteoarthritis, unspecified site: Secondary | ICD-10-CM | POA: Insufficient documentation

## 2015-10-27 DIAGNOSIS — Z872 Personal history of diseases of the skin and subcutaneous tissue: Secondary | ICD-10-CM | POA: Insufficient documentation

## 2015-10-27 LAB — CBG MONITORING, ED: Glucose-Capillary: 136 mg/dL — ABNORMAL HIGH (ref 65–99)

## 2015-10-27 MED ORDER — LIDOCAINE-EPINEPHRINE 1 %-1:100000 IJ SOLN
20.0000 mL | Freq: Once | INTRAMUSCULAR | Status: AC
  Administered 2015-10-27: 20 mL via INTRADERMAL
  Filled 2015-10-27: qty 1

## 2015-10-27 NOTE — ED Provider Notes (Signed)
CSN: [id number]     Arrival date & time 10/27/15  1116 History   First MD Initiated Contact with Patient 10/27/15 1119     Chief Complaint  Patient presents with  . Fall     HPI  Patient presents for evaluation after a fall at his assisted living facility. He was in a shower chair being assisted by a nurses aide. The aide turned away for a few seconds to gather his close in a towel. He leaned forward to start to dry his feet and fell forward striking his forehead on the faucet in front of him and fell onto the floor. He complains of a headache and a laceration as well as neck pain and right shoulder pain.  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 He had a service related injury and fracture of his right shoulder/humeral neck. Is not anti- coagulated.  Past Medical History  Diagnosis Date  . Hypertension   . Diabetes mellitus without complication ([place])   . Arthritis   . Sciatica   . GERD (gastroesophageal reflux disease)   . Urge incontinence   . Vertigo   . Hyperlipidemia   . ED (erectile dysfunction)   . Gastric polyposis   . Osteoporosis   . Decreased libido   . Depression   . Hepatitis A   . Stroke ([place])   . History of blood transfusion     1946  . OA (osteoarthritis)   . Bladder cancer ([place])   . Prostate cancer ([place])     S/P prostatectomy; Lung metastasis  . CAD (coronary artery disease) 1996    stent to the LAD   . Hyperlipidemia   . Type 2 diabetes mellitus ([place])   . Ejection fraction   . COPD (chronic obstructive pulmonary disease) ([place])   . Ulcer of left heel [facility])    Past Surgical History  Procedure Laterality Date  . Carpal tunnel release    . Cataract extraction w/ intraocular lens  implant, bilateral  1998  . Prostatectomy    . Urinary sphincter implant    . Urinary sphincter implant revision    . Appendectomy    . Left hip surgery      Donated bone for bone graft to arm  . Penile prosthesis implant      S/P removal and re-implantation of new prosthesis  .  Middle ear surgery    . Finger surgery      Left and right  . Hernia repair    . Bladder surgery    . Right arm bone graft      Pathological fracture  . Cardiac catheterization  11/27/2001    patent stent with mild in-stent restenosis.  . Myoview  06/12/2009    Persantine myoview EF 76%; Normal myoview  . Coronary angioplasty  1996    stenting to the L[place] in [place].    Family History  Problem Relation Age of Onset  . Heart failure Mother   . Heart disease Mother   . Bladder Cancer Father   . Pancreatic cancer Sister   . Lung cancer Sister   . Breast cancer Daughter   . Liver disease Son    Social History  Substance Use Topics  . Smoking status: Former Research scientist (life sciences)  . Smokeless tobacco: Never Used  . Alcohol Use: No    Review of Systems  Constitutional: Negative for fever, chills, diaphoresis, appetite change and fatigue.  HENT: Negative for mouth sores, sore throat and trouble swallowing.   Eyes:  Negative for visual disturbance.  Respiratory: Negative for cough, chest tightness, shortness of breath and wheezing.   Cardiovascular: Negative for chest pain.  Gastrointestinal: Negative for nausea, vomiting, abdominal pain, diarrhea and abdominal distention.  Endocrine: Negative for polydipsia, polyphagia and polyuria.  Genitourinary: Negative for dysuria, frequency and hematuria.  Musculoskeletal: Positive for arthralgias and neck pain. Negative for gait problem.  Skin: Positive for wound. Negative for color change, pallor and rash.  Neurological: Positive for headaches. Negative for dizziness, syncope and light-headedness.  Hematological: Does not bruise/bleed easily.  Psychiatric/Behavioral: Negative for behavioral problems and confusion.      Allergies  Albuterol sulfate hfa; Adhesive; Albuterol; Contrast media; Ioxaglate; Lactose; Metrizamide; Penicillins; Sulfa drugs cross reactors; Budesonide-formoterol fumarate; and Sulfamethoxazole  Home Medications   Prior to  Admission medications   Medication Sig Start Date End Date Taking? Authorizing Provider  amLODipine (NORVASC) 10 MG tablet Take 10 mg by mouth daily.    Historical Provider, MD  aspirin 81 MG tablet Take 81 mg by mouth daily. For heart theraphy    Historical Provider, MD  bicalutamide (CASODEX) 50 MG tablet Take 50 mg by mouth daily.    Historical Provider, MD  Calcium Carbonate-Vitamin D (CALCIUM 600+D HIGH POTENCY) 600-400 MG-UNIT per tablet Take 1 tablet by mouth 2 (two) times daily.      Historical Provider, MD  camphor-menthol Timoteo Ace) lotion Apply 1 application topically daily as needed for itching.    Historical Provider, MD  ciprofloxacin (CIPRO) 500 MG tablet Take 1 tablet (500 mg total) by mouth 2 (two) times daily. 07/05/15   Leo Grosser, MD  CRANBERRY CONCENTRATE PO Take 1 capsule by mouth 3 (three) times daily.     Historical Provider, MD  cycloSPORINE (RESTASIS) 0.05 % ophthalmic emulsion Place 1 drop into both eyes 2 (two) times daily.    Historical Provider, MD  enzalutamide Gillermina Phy) 40 MG capsule Take 160 mg by mouth daily.    Historical Provider, MD  fluticasone (FLONASE) 50 MCG/ACT nasal spray Place 2 sprays into the nose 2 (two) times daily as needed for allergies or rhinitis.     Historical Provider, MD  gabapentin (NEURONTIN) 100 MG capsule Take 1-3 capsules by mouth at bedtime as needed (for itching).  02/24/15   Historical Provider, MD  insulin aspart (NOVOLOG) 100 UNIT/ML injection Inject 0-6 Units into the skin 3 (three) times daily with meals. 0-150=0; 151-200=2u; 201-250=3u; 251-300=4u; 301-350=5u; over 350 =6u prior to meals. With breakfast and lunch. 01/18/13   Gerlene Fee, NP  insulin glargine (LANTUS) 100 UNIT/ML injection Inject 18 Units into the skin daily with breakfast.    Historical Provider, MD  lipase/protease/amylase (CREON-12/PANCREASE) 12000 UNITS CPEP Take 1 capsule by mouth 3 (three) times daily. For AKI    Historical Provider, MD  losartan (COZAAR) 25 MG  tablet Take 1 tablet (25 mg total) by mouth daily. 05/24/15   Ripudeep Krystal Eaton, MD  metoprolol succinate (TOPROL-XL) 25 MG 24 hr tablet Take 12.5 mg by mouth daily. For HTN    Historical Provider, MD  Multiple Vitamins-Minerals (HAIR/SKIN/NAILS) TABS Take 1 tablet by mouth 3 (three) times daily.    Historical Provider, MD  Multiple Vitamins-Minerals (MULTIVITAMINS THER. W/MINERALS) TABS Take 1 tablet by mouth every morning.     Historical Provider, MD  nitroGLYCERIN (NITROSTAT) 0.4 MG SL tablet Place 0.4 mg under the tongue every 5 (five) minutes as needed for chest pain.    Historical Provider, MD  omeprazole (PRILOSEC) 20 MG capsule Take 20 mg  by mouth every other day. For GERD    Historical Provider, MD  polyethylene glycol (MIRALAX / GLYCOLAX) packet Take 17 g by mouth daily. 05/24/15   Ripudeep Krystal Eaton, MD  senna-docusate (SENOKOT S) 8.6-50 MG per tablet Take 1 tablet by mouth at bedtime. For constipation 05/24/15   Ripudeep Krystal Eaton, MD  sertraline (ZOLOFT) 100 MG tablet Take 200 mg by mouth daily. For depression    Historical Provider, MD  simvastatin (ZOCOR) 20 MG tablet Take 10 mg by mouth every other day. Take 1/2 tablet =10 mg for HLD.    Historical Provider, MD  traMADol (ULTRAM) 50 MG tablet Take 1 tablet (50 mg total) by mouth every 6 (six) hours as needed for moderate pain or severe pain. 05/24/15   Ripudeep K Rai, MD   BP 178/64 mmHg  Pulse 64  Temp(Src) 98.4 F (36.9 C) (Oral)  Resp 16  Ht '5\' 6"'$  (1.676 m)  Wt 113 lb (51.256 kg)  BMI 18.25 kg/m2  SpO2 98% Physical Exam  Constitutional: He is oriented to person, place, and time. He appears well-developed and well-nourished. No distress.  HENT:  Head: Normocephalic.    No blood over the TMs, mastoids, or from ears nose or mouth. Normally shocked movements. Normal cranial nerves. Mild diffuse midline neck tenderness.  Eyes: Conjunctivae are normal. Pupils are equal, round, and reactive to light. No scleral icterus.  Neck: Normal range  of motion. Neck supple. No thyromegaly present.  Cardiovascular: Normal rate and regular rhythm.  Exam reveals no gallop and no friction rub.   No murmur heard. Pulmonary/Chest: Effort normal and breath sounds normal. No respiratory distress. He has no wheezes. He has no rales.  Abdominal: Soft. Bowel sounds are normal. He exhibits no distension. There is no tenderness. There is no rebound.  Musculoskeletal: Normal range of motion.       Arms: Neurological: He is alert and oriented to person, place, and time.  Skin: Skin is warm and dry. No rash noted.  Psychiatric: He has a normal mood and affect. His behavior is normal.    ED Course  Procedures (including critical care time) Labs Review Labs Reviewed  CBG MONITORING, ED - Abnormal; Notable for the following:    Glucose-Capillary 136 (*)    All other components within normal limits    Imaging Review Dg Shoulder Right  10/27/2015  CLINICAL DATA:  Recent fall with shoulder pain, initial encounter EXAM: RIGHT SHOULDER - 2+ VIEW COMPARISON:  07/29/2014 FINDINGS: Proximal right humeral fracture with healing is again identified and stable. Degenerative changes of the acromioclavicular joint are seen. No acute abnormality is noted. IMPRESSION: Healed right proximal humeral fracture.  No acute abnormality noted. Electronically Signed   By: Inez Catalina M.D.   On: 10/27/2015 13:51   Ct Head Wo Contrast  10/27/2015  CLINICAL DATA:  Pain following fall EXAM: CT HEAD WITHOUT CONTRAST CT CERVICAL SPINE WITHOUT CONTRAST TECHNIQUE: Multidetector CT imaging of the head and cervical spine was performed following the standard protocol without intravenous contrast. Multiplanar CT image reconstructions of the cervical spine were also generated. COMPARISON:  Cervical spine CT Feb 08, 2014; head CT May 20, 2015 FINDINGS: CT HEAD FINDINGS Moderate diffuse atrophy is stable. There is no intracranial mass, hemorrhage, extra-axial fluid collection, or midline  shift. There is small vessel disease throughout the centra semiovale bilaterally, stable. There is a prior lacunar infarct in the anterior inferior left centrum semiovale, stable. There is evidence of a prior small lacunar  infarct in the posterior limb of the right internal capsule. There is also evidence of a prior tiny lacunar infarct in the right thalamus. A tiny lacunar infarct is noted in the lateral mid left cerebellum, stable. There is no new gray-white compartment lesion. No acute infarct is evident. The bony calvarium and appears intact. The mastoid air cells are clear. No intraorbital lesions are identified. There is opacification of the visualized right maxillary antrum with calcification in this area. There is opacification of nearly all of the ethmoid air cells on the right as well as several ethmoid air cells on the left. There is opacification of the right frontal sinus with mild mucosal thickening in the inferior left frontal sinus. CT CERVICAL SPINE FINDINGS There is no acute cervical spine fracture or spondylolisthesis. Bones are diffusely osteoporotic. There is marked compression of the T 3, T4, and T5 vertebral bodies. There is slight chronic appearing anterior wedging at T10 and T6. There is increase in upper thoracic kyphosis with exaggeration of cervical lordosis. Prevertebral soft tissues and predental space regions are normal. There is facet hypertrophy at most levels bilaterally with exit foraminal narrowing due to bony hypertrophy at multiple levels, essentially unchanged. No disc extrusion or stenosis. Moderate pannus is noted surrounding the odontoid without appreciable spinal stenosis at this area. There is calcification in both carotid arteries, more pronounced on the left than on the right. There is atelectatic change in the left upper lobe. There is a small calcified granuloma in the posterior right apex region. IMPRESSION: CT head: Atrophy with small vessel disease and prior small  infarcts as summarized above. No acute infarct evident. No intracranial mass, hemorrhage, or extra-axial fluid collection. Multifocal paranasal sinus disease as summarized above. CT cervical spine: No acute cervical spine fracture or spondylolisthesis. Multiple thoracic spine compression fractures are noted, most marked at T3 and T4 but present multiple levels. Osteoarthritic changes noted at multiple levels. Bones are diffusely osteoporotic. There is carotid artery calcification bilaterally, more severe on the left than on the right. Electronically Signed   By: Lowella Grip III M.D.   On: 10/27/2015 13:24   Ct Cervical Spine Wo Contrast  10/27/2015  CLINICAL DATA:  Pain following fall EXAM: CT HEAD WITHOUT CONTRAST CT CERVICAL SPINE WITHOUT CONTRAST TECHNIQUE: Multidetector CT imaging of the head and cervical spine was performed following the standard protocol without intravenous contrast. Multiplanar CT image reconstructions of the cervical spine were also generated. COMPARISON:  Cervical spine CT Feb 08, 2014; head CT May 20, 2015 FINDINGS: CT HEAD FINDINGS Moderate diffuse atrophy is stable. There is no intracranial mass, hemorrhage, extra-axial fluid collection, or midline shift. There is small vessel disease throughout the centra semiovale bilaterally, stable. There is a prior lacunar infarct in the anterior inferior left centrum semiovale, stable. There is evidence of a prior small lacunar infarct in the posterior limb of the right internal capsule. There is also evidence of a prior tiny lacunar infarct in the right thalamus. A tiny lacunar infarct is noted in the lateral mid left cerebellum, stable. There is no new gray-white compartment lesion. No acute infarct is evident. The bony calvarium and appears intact. The mastoid air cells are clear. No intraorbital lesions are identified. There is opacification of the visualized right maxillary antrum with calcification in this area. There is  opacification of nearly all of the ethmoid air cells on the right as well as several ethmoid air cells on the left. There is opacification of the right frontal sinus with  mild mucosal thickening in the inferior left frontal sinus. CT CERVICAL SPINE FINDINGS There is no acute cervical spine fracture or spondylolisthesis. Bones are diffusely osteoporotic. There is marked compression of the T 3, T4, and T5 vertebral bodies. There is slight chronic appearing anterior wedging at T10 and T6. There is increase in upper thoracic kyphosis with exaggeration of cervical lordosis. Prevertebral soft tissues and predental space regions are normal. There is facet hypertrophy at most levels bilaterally with exit foraminal narrowing due to bony hypertrophy at multiple levels, essentially unchanged. No disc extrusion or stenosis. Moderate pannus is noted surrounding the odontoid without appreciable spinal stenosis at this area. There is calcification in both carotid arteries, more pronounced on the left than on the right. There is atelectatic change in the left upper lobe. There is a small calcified granuloma in the posterior right apex region. IMPRESSION: CT head: Atrophy with small vessel disease and prior small infarcts as summarized above. No acute infarct evident. No intracranial mass, hemorrhage, or extra-axial fluid collection. Multifocal paranasal sinus disease as summarized above. CT cervical spine: No acute cervical spine fracture or spondylolisthesis. Multiple thoracic spine compression fractures are noted, most marked at T3 and T4 but present multiple levels. Osteoarthritic changes noted at multiple levels. Bones are diffusely osteoporotic. There is carotid artery calcification bilaterally, more severe on the left than on the right. Electronically Signed   By: Lowella Grip III M.D.   On: 10/27/2015 13:24   I have personally reviewed and evaluated these images and lab results as part of my medical  decision-making.   EKG Interpretation None      MDM   Final diagnoses:  Scalp laceration, initial encounter     LACERATION REPAIR Performed by: Lolita Patella Authorized by: Lolita Patella Consent: Verbal consent obtained. Risks and benefits: risks, benefits and alternatives were discussed Consent given by: patient Patient identity confirmed: provided demographic data Prepped and Draped in normal sterile fashion Wound explored  Laceration Location: forhead  Laceration Length: 5cm  No Foreign Bodies seen or palpated  Anesthesia: local infiltration  Local anesthetic: lidocaine 1% c epinephrine  Anesthetic total: 5 ml  Irrigation method: syringe Amount of cleaning: standard  Skin closure: 4-0 Nylon  Number of sutures: 7  Technique: simple  Patient tolerance: Patient tolerated the procedure well with no immediate complications. Reassuring studies. Laceration repaired. Discharged home.    Tanna Furry, MD 10/27/15 [phone]

## 2015-10-27 NOTE — ED Notes (Signed)
Dr. Jeneen Rinks at bedside suturing

## 2015-10-27 NOTE — Discharge Instructions (Signed)
Suture removal in 7-10 days. Bruising and swelling may extend down to his upper eyelids in the next 24-48 hours.  Laceration Care, Adult A laceration is a cut that goes through all layers of the skin. The cut also goes into the tissue that is right under the skin. Some cuts heal on their own. Others need to be closed with stitches (sutures), staples, skin adhesive strips, or wound glue. Taking care of your cut lowers your risk of infection and helps your cut to heal better. HOW TO TAKE CARE OF YOUR CUT For stitches or staples:  Keep the wound clean and dry.  If you were given a bandage (dressing), you should change it at least one time per day or as told by your doctor. You should also change it if it gets wet or dirty.  Keep the wound completely dry for the first 24 hours or as told by your doctor. After that time, you may take a shower or a bath. However, make sure that the wound is not soaked in water until after the stitches or staples have been removed.  Clean the wound one time each day or as told by your doctor:  Wash the wound with soap and water.  Rinse the wound with water until all of the soap comes off.  Pat the wound dry with a clean towel. Do not rub the wound.  After you clean the wound, put a thin layer of antibiotic ointment on it as told by your doctor. This ointment:  Helps to prevent infection.  Keeps the bandage from sticking to the wound.  Have your stitches or staples removed as told by your doctor. If your doctor used skin adhesive strips:   Keep the wound clean and dry.  If you were given a bandage, you should change it at least one time per day or as told by your doctor. You should also change it if it gets dirty or wet.  Do not get the skin adhesive strips wet. You can take a shower or a bath, but be careful to keep the wound dry.  If the wound gets wet, pat it dry with a clean towel. Do not rub the wound.  Skin adhesive strips fall off on their own.  You can trim the strips as the wound heals. Do not remove any strips that are still stuck to the wound. They will fall off after a while. If your doctor used wound glue:  Try to keep your wound dry, but you may briefly wet it in the shower or bath. Do not soak the wound in water, such as by swimming.  After you take a shower or a bath, gently pat the wound dry with a clean towel. Do not rub the wound.  Do not do any activities that will make you really sweaty until the skin glue has fallen off on its own.  Do not apply liquid, cream, or ointment medicine to your wound while the skin glue is still on.  If you were given a bandage, you should change it at least one time per day or as told by your doctor. You should also change it if it gets dirty or wet.  If a bandage is placed over the wound, do not let the tape for the bandage touch the skin glue.  Do not pick at the glue. The skin glue usually stays on for 5-10 days. Then, it falls off of the skin. General Instructions  To help prevent scarring,  make sure to cover your wound with sunscreen whenever you are outside after stitches are removed, after adhesive strips are removed, or when wound glue stays in place and the wound is healed. Make sure to wear a sunscreen of at least 30 SPF.  Take over-the-counter and prescription medicines only as told by your doctor.  If you were given antibiotic medicine or ointment, take or apply it as told by your doctor. Do not stop using the antibiotic even if your wound is getting better.  Do not scratch or pick at the wound.  Keep all follow-up visits as told by your doctor. This is important.  Check your wound every day for signs of infection. Watch for:  Redness, swelling, or pain.  Fluid, blood, or pus.  Raise (elevate) the injured area above the level of your heart while you are sitting or lying down, if possible. GET HELP IF:  You got a tetanus shot and you have any of these problems at  the injection site:  Swelling.  Very bad pain.  Redness.  Bleeding.  You have a fever.  A wound that was closed breaks open.  You notice a bad smell coming from your wound or your bandage.  You notice something coming out of the wound, such as wood or glass.  Medicine does not help your pain.  You have more redness, swelling, or pain at the site of your wound.  You have fluid, blood, or pus coming from your wound.  You notice a change in the color of your skin near your wound.  You need to change the bandage often because fluid, blood, or pus is coming from the wound.  You start to have a new rash.  You start to have numbness around the wound. GET HELP RIGHT AWAY IF:  You have very bad swelling around the wound.  Your pain suddenly gets worse and is very bad.  You notice painful lumps near the wound or on skin that is anywhere on your body.  You have a red streak going away from your wound.  The wound is on your hand or foot and you cannot move a finger or toe like you usually can.  The wound is on your hand or foot and you notice that your fingers or toes look pale or bluish.   This information is not intended to replace advice given to you by your health care provider. Make sure you discuss any questions you have with your health care provider.   Document Released: 03/04/2008 Document Revised: 01/31/2015 Document Reviewed: 09/12/2014 Elsevier Interactive Patient Education 2016 [place] Sutures are stitches that can be used to close wounds. Taking care of your wound properly can help to prevent pain and infection. It can also help your wound to heal more quickly. HOW TO CARE FOR YOUR SUTURED WOUND Wound Care  Keep the wound clean and dry.  If you were given a bandage (dressing), you should change it at least once per day or as directed by your health care provider. You should also change it if it becomes wet or dirty.  Keep the  wound completely dry for the first 24 hours or as directed by your health care provider. After that time, you may shower or bathe. However, make sure that the wound is not soaked in water until the sutures have been removed.  Clean the wound one time each day or as directed by your health care provider.  Wash the  wound with soap and water.  Rinse the wound with water to remove all soap.  Pat the wound dry with a clean towel. Do not rub the wound.  Aftercleaning the wound, apply a thin layer of antibioticointment as directed by your health care provider. This will help to prevent infection and keep the dressing from sticking to the wound.  Have the sutures removed as directed by your health care provider. General Instructions  Take or apply medicines only as directed by your health care provider.  To help prevent scarring, make sure to cover your wound with sunscreen whenever you are outside after the sutures are removed and the wound is healed. Make sure to wear a sunscreen of at least 30 SPF.  If you were prescribed an antibiotic medicine or ointment, finish all of it even if you start to feel better.  Do not scratch or pick at the wound.  Keep all follow-up visits as directed by your health care provider. This is important.  Check your wound every day for signs of infection. Watch for:   Redness, swelling, or pain.  Fluid, blood, or pus.  Raise (elevate) the injured area above the level of your heart while you are sitting or lying down, if possible.  Avoid stretching your wound.  Drink enough fluids to keep your urine clear or pale yellow. SEEK MEDICAL CARE IF:  You received a tetanus shot and you have swelling, severe pain, redness, or bleeding at the injection site.  You have a fever.  A wound that was closed breaks open.  You notice a bad smell coming from the wound.  You notice something coming out of the wound, such as wood or glass.  Your pain is not  controlled with medicine.  You have increased redness, swelling, or pain at the site of your wound.  You have fluid, blood, or pus coming from your wound.  You notice a change in the color of your skin near your wound.  You need to change the dressing frequently due to fluid, blood, or pus draining from the wound.  You develop a new rash.  You develop numbness around the wound. SEEK IMMEDIATE MEDICAL CARE IF:  You develop severe swelling around the injury site.  Your pain suddenly increases and is severe.  You develop painful lumps near the wound or on skin that is anywhere on your body.  You have a red streak going away from your wound.  The wound is on your hand or foot and you cannot properly move a finger or toe.  The wound is on your hand or foot and you notice that your fingers or toes look pale or bluish.   This information is not intended to replace advice given to you by your health care provider. Make sure you discuss any questions you have with your health care provider.   Document Released: 10/24/2004 Document Revised: 01/31/2015 Document Reviewed: 04/28/2013 Elsevier Interactive Patient Education [employer].

## 2015-10-27 NOTE — ED Notes (Signed)
Per EMS pt from home was bathing in shower chair with CNA when he leaned forward to wash feet and fell forward in shower and hit front of head. Has small 1x2x2 laceration on forehead, bleeding controlled. Patient has left knee abrasion with bleeding controlled. Patient did not lose consciousness, no bogginess or creptitus noted. Patient does not take blood thinners. Patient is HOH, better hearing on right side.

## 2015-11-06 ENCOUNTER — Ambulatory Visit: Payer: Medicare Other | Admitting: Physical Therapy

## 2015-11-07 ENCOUNTER — Ambulatory Visit: Payer: Medicare Other | Admitting: Physical Therapy

## 2016-01-29 ENCOUNTER — Telehealth: Payer: Self-pay | Admitting: Cardiovascular Disease

## 2016-01-29 NOTE — Telephone Encounter (Signed)
Pt is calling in stating that the pt is needing a water pill because he is retaining fluid around his heart. Please f/u with the pt's wife

## 2016-01-30 ENCOUNTER — Telehealth: Payer: Self-pay | Admitting: Cardiovascular Disease

## 2016-01-30 MED ORDER — FUROSEMIDE 20 MG PO TABS: 20.0000 mg | ORAL_TABLET | Freq: Every day | ORAL | Status: DC

## 2016-01-30 NOTE — Telephone Encounter (Signed)
Pt of Dr. Gwendolyn Fill to patient, who gave me permission to speak to wife regarding his condition. She explains pt had lung drained of fluid last week at walk-in urgent care. Was placed on antibiotic and is almost done w/ this. Had CXR. Having nasal discharge and upper resp congestion. No lower extremity or abdominal swelling. Wife states they found fluid around heart and she is concerned.  Physician at urgent care insisted pt needed to be followed up by cardiology and recommended low dose furosemide for patient but wanted cardiology to prescribe. She wants our office to prescribe the furosemide (he was previously on '20mg'$  strength daily ~3 yrs ago) Home health aide w patient until 3pm today and is faxing over the urgent care notes to our office.  I explained I was not sure if we could prescribe w/o seeing pt but would see if physician/PA visit recommended today/this week or OK to prescribe. She became very upset and began yelling at me demanding pt be prescribed the medication today. I was allowed to explain that I would just need to seek physician advice on this. Caller voiced understanding. She is requesting an urgent response on this. Routed to [facility].

## 2016-01-30 NOTE — Telephone Encounter (Signed)
New message    *STAT* If patient is at the pharmacy, call can be transferred to refill team.   1. Which medications need to be refilled? (please list name of each medication and dose if known) Lasix '20mg'$  po once a day  2. Which pharmacy/location (including street and city if local pharmacy) is medication to be sent to? [facility] in [place] on [address]  3. Do they need a 30 day or 90 day supply? 30 day supply

## 2016-01-30 NOTE — Telephone Encounter (Signed)
Patient's wife spoke with Lauralyn Primes, RN on triage and med was Rx'ed for a 7 day supply and patient is scheduled to see Dr. Gwenlyn Found on 5/3  Med was previously taken off med list at hospital discharge 01/04/2013.

## 2016-01-30 NOTE — Telephone Encounter (Signed)
We can prescribe lasix 20 mg daily for one week but he needs to be seen to decide on long term therapy. 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   Famous Eisenhardt Martinique MD, [facility]

## 2016-01-30 NOTE — Telephone Encounter (Signed)
Lasix for 1 week, gave instructions for patient to wife w/ patient's permission. Advised that he would need appt for long term therapy recommendations. Added for open slot on Dr. Kennon Holter schedule tomorrow.

## 2016-01-30 NOTE — Addendum Note (Signed)
Addended by: Charlotte Sanes R on: 01/30/2016 12:00 PM   Modules accepted: Orders

## 2016-01-31 ENCOUNTER — Ambulatory Visit (INDEPENDENT_AMBULATORY_CARE_PROVIDER_SITE_OTHER): Payer: Medicare Other | Admitting: Cardiovascular Disease

## 2016-01-31 ENCOUNTER — Encounter: Payer: Self-pay | Admitting: Cardiovascular Disease

## 2016-01-31 VITALS — BP 130/62 | HR 82 | Ht 66.0 in | Wt 105.0 lb

## 2016-01-31 DIAGNOSIS — E785 Hyperlipidemia, unspecified: Secondary | ICD-10-CM | POA: Diagnosis not present

## 2016-01-31 DIAGNOSIS — I2583 Coronary atherosclerosis due to lipid rich plaque: Secondary | ICD-10-CM

## 2016-01-31 DIAGNOSIS — I2510 Atherosclerotic heart disease of native coronary artery without angina pectoris: Secondary | ICD-10-CM

## 2016-01-31 DIAGNOSIS — I10 Essential (primary) hypertension: Secondary | ICD-10-CM | POA: Diagnosis not present

## 2016-01-31 NOTE — Assessment & Plan Note (Signed)
History of hyperlipidemia on statin therapy followed by his PCP 

## 2016-01-31 NOTE — Assessment & Plan Note (Addendum)
History of hypertension and blood pressure measurements at 130/62. He is on amlodipine , metoprolol and losartan. Continue current meds at current dosing

## 2016-01-31 NOTE — Patient Instructions (Signed)
Your physician recommends that you continue on your current medications as directed. Please refer to the Current Medication list given to you today.   Your physician wants you to follow-up in: [place]. You will receive a reminder letter in the mail two months in advance. If you don't receive a letter, please call our office to schedule the follow-up appointment.

## 2016-01-31 NOTE — Assessment & Plan Note (Signed)
History of CAD status post LAD stenting in 1996 in [place]. I performed Him 11/19/01 Revealing a Patent LAD Stent with Mild In-Stent Restenosis His Last Myoview Stress Test Performed March 2008 Was Nonischemic as was 19/13/10. He denies chest pain. He does get short of breath probably from noncardiac causes

## 2016-01-31 NOTE — Progress Notes (Signed)
01/31/2016 Guy [name]   [birth year]  [id number]  Primary Physician Guy Patricia, MD Primary Cardiologist: Guy Harp MD Guy [name]   HPI:  The patient is a very pleasant 82 year old thin and frail appearing married Caucasian male, father of 83, grandfather of 4 great grandchildren who is accompanied by his wife today who is also a patient of mine. I last saw him 08/18/15.[name] He has a history of CAD status post remote LAD stenting in 1996 in [place]. I catheterized him November 27, 2001 revealing a patent LAD stent with mild in-stent restenosis. His last Myoview performed in March of 2008 was nonischemic, as was one on June 12, 2009. His other problems include hypertension, hyperlipidemia, and non-insulin-requiring diabetes. He does have metastatic prostate cancer involving his lungs and ribs and has had multiple operations for these. He gets occasionally chest pain which is nitrate responsive. Since I saw him last he had hospitalizations for fractured vertebrae requiring kyphoplasty as well as urinary tract infection and surgery on his urethra. He has developed recurrent prostate cancer is being treated at the [facility]. He is for the most part wheelchair-bound and is extremely weak and cachectic. It sounds like he had a thoracentesis recently for pleural effusion. The chest x-ray reports shows a left lower lobe massI discussed his CODE STATUS and living will with his wife and apparently he does have a living will and wishes to be a DO NOT RESUSCITATE.   Current Outpatient Prescriptions  Medication Sig Dispense Refill  . amLODipine (NORVASC) 10 MG tablet Take 10 mg by mouth daily.    [name] aspirin 81 MG tablet Take 81 mg by mouth daily. For heart theraphy    . bicalutamide (CASODEX) 50 MG tablet Take 50 mg by mouth daily.    . Calcium Carbonate-Vitamin D (CALCIUM 600+D HIGH POTENCY) 600-400 MG-UNIT per tablet Take 1 tablet by mouth 2 (two) times  daily.      . camphor-menthol (SARNA) lotion Apply 1 application topically daily as needed for itching.    . ciprofloxacin (CIPRO) 500 MG tablet Take 1 tablet (500 mg total) by mouth 2 (two) times daily. 28 tablet 0  . CRANBERRY CONCENTRATE PO Take 1 capsule by mouth 3 (three) times daily.     . cycloSPORINE (RESTASIS) 0.05 % ophthalmic emulsion Place 1 drop into both eyes 2 (two) times daily.    . enzalutamide (XTANDI) 40 MG capsule Take 160 mg by mouth daily.    . fluticasone (FLONASE) 50 MCG/ACT nasal spray Place 2 sprays into the nose 2 (two) times daily as needed for allergies or rhinitis.     . furosemide (LASIX) 20 MG tablet Take 1 tablet (20 mg total) by mouth daily. 7 tablet 0  . gabapentin (NEURONTIN) 100 MG capsule Take 1-3 capsules by mouth at bedtime as needed (for itching).     . insulin aspart (NOVOLOG) 100 UNIT/ML injection Inject 0-6 Units into the skin 3 (three) times daily with meals. 0-150=0; 151-200=2u; 201-250=3u; 251-300=4u; 301-350=5u; over 350 =6u prior to meals. With breakfast and lunch.    . insulin glargine (LANTUS) 100 UNIT/ML injection Inject 18 Units into the skin daily with breakfast.    . lipase/protease/amylase (CREON-12/PANCREASE) 12000 UNITS CPEP Take 1 capsule by mouth 3 (three) times daily. For AKI    . losartan (COZAAR) 25 MG tablet Take 1 tablet (25 mg total) by mouth daily. 30 tablet 3  . metoprolol succinate (TOPROL-XL) 25 MG 24 hr tablet  Take 12.5 mg by mouth daily. For HTN    . Multiple Vitamins-Minerals (HAIR/SKIN/NAILS) TABS Take 1 tablet by mouth 3 (three) times daily.    . Multiple Vitamins-Minerals (MULTIVITAMINS THER. W/MINERALS) TABS Take 1 tablet by mouth every morning.     . nitroGLYCERIN (NITROSTAT) 0.4 MG SL tablet Place 0.4 mg under the tongue every 5 (five) minutes as needed for chest pain.    [name] omeprazole (PRILOSEC) 20 MG capsule Take 20 mg by mouth every other day. For GERD    . polyethylene glycol (MIRALAX / GLYCOLAX) packet Take 17 g by  mouth daily. 30 each 4  . senna-docusate (SENOKOT S) 8.6-50 MG per tablet Take 1 tablet by mouth at bedtime. For constipation 30 tablet 3  . sertraline (ZOLOFT) 100 MG tablet Take 200 mg by mouth daily. For depression    . simvastatin (ZOCOR) 20 MG tablet Take 10 mg by mouth every other day. Take 1/2 tablet =10 mg for HLD.    [name] traMADol (ULTRAM) 50 MG tablet Take 1 tablet (50 mg total) by mouth every 6 (six) hours as needed for moderate pain or severe pain. 30 tablet 0   No current facility-administered medications for this visit.    Allergies  Allergen Reactions  . Albuterol Sulfate Hfa [Kdc:Albuterol] Shortness Of Breath and Swelling  . Adhesive [Tape] Other (See Comments)    REACTION: SKIN BLISTERS  . Albuterol Swelling    Per pt., side of his neck began to swell with nebulized Albuterol in doctor's office.  . Contrast Media [Iodinated Diagnostic Agents] Other (See Comments)    unknown Reaction unknown  . Ioxaglate Other (See Comments)    Reaction unknown  . Lactose     Other reaction(s): GI Upset (intolerance)  . Metrizamide Other (See Comments)    Reaction unknown  . Penicillins Itching and Other (See Comments)    REACTION: ITCHING HANDS Has patient had a PCN reaction causing immediate rash, facial/tongue/throat swelling, SOB or lightheadedness with hypotension: No Has patient had a PCN reaction causing severe rash involving mucus membranes or skin necrosis: No Has patient had a PCN reaction that required hospitalization Yes Has patient had a PCN reaction occurring within the last 10 years: No If all of the above answers are "NO", then may proceed with Cephalosporin use.   . Sulfa Drugs Cross Reactors Hives  . Budesonide-Formoterol Fumarate Rash  . Sulfamethoxazole Rash    Social History   Social History  . Marital Status: Married    Spouse Name: Guy [name]  . Number of Children: 3  . Years of Education: N/A   Occupational History  . Business owner, Press photographer, retired     Social History Main Topics  . Smoking status: Former Research scientist (life sciences)  . Smokeless tobacco: Never Used  . Alcohol Use: No  . Drug Use: No  . Sexual Activity: No   Other Topics Concern  . Not on file   Social History Narrative   Married.  Lives with his wife.  Ambulates with a walker and a cane.     Review of Systems: General: negative for chills, fever, night sweats or weight changes.  Cardiovascular: negative for chest pain, dyspnea on exertion, edema, orthopnea, palpitations, paroxysmal nocturnal dyspnea or shortness of breath Dermatological: negative for rash Respiratory: negative for cough or wheezing Urologic: negative for hematuria Abdominal: negative for nausea, vomiting, diarrhea, bright red blood per rectum, melena, or hematemesis Neurologic: negative for visual changes, syncope, or dizziness All other systems reviewed and are otherwise negative  except as noted above.    Blood pressure 130/62, pulse 82, height '5\' 6"'$  (1.676 m), weight 105 lb (47.628 kg).  General appearance: alert and no distress Neck: no adenopathy, no carotid bruit, no JVD, supple, symmetrical, trachea midline and thyroid not enlarged, symmetric, no tenderness/mass/nodules Lungs: clear to auscultation bilaterally Heart: regular rate and rhythm, S1, S2 normal, no murmur, click, rub or gallop Extremities: extremities normal, atraumatic, no cyanosis or edema  EKG unctional rhythm at 82 with right bundle branch block. I personally reviewed his EKG  ASSESSMENT AND PLAN:   Essential hypertension, benign History of hypertension and blood pressure measurements at 130/62. He is on amlodipine , metoprolol and losartan. Continue current meds at current dosing  Hyperlipidemia History of hyperlipidemia on statin therapy followed by his PCP  CAD (coronary artery disease) History of CAD status post LAD stenting in 1996 in [place]. I performed Him 11/19/01 Revealing a Patent LAD Stent with Mild In-Stent Restenosis  His Last Myoview Stress Test Performed March 2008 Was Nonischemic as was 19/13/10. He denies chest pain. He does get short of breath probably from noncardiac causes      Guy Harp MD [facility], [facility] 01/31/2016 11:39 AM

## 2016-02-12 NOTE — Addendum Note (Signed)
Addended by: Therisa Doyne on: 02/12/2016 04:25 PM   Modules accepted: Orders

## 2016-06-26 ENCOUNTER — Other Ambulatory Visit: Payer: Self-pay | Admitting: Dermatology

## 2016-08-14 ENCOUNTER — Encounter (HOSPITAL_COMMUNITY): Payer: Self-pay | Admitting: Emergency Medicine

## 2016-08-14 ENCOUNTER — Inpatient Hospital Stay (HOSPITAL_COMMUNITY)
Admission: EM | Admit: 2016-08-14 | Discharge: 2016-08-18 | DRG: 291 | Disposition: A | Discharge status: Home Health | Payer: Medicare Other | Attending: Family Medicine | Admitting: Family Medicine

## 2016-08-14 DIAGNOSIS — E11649 Type 2 diabetes mellitus with hypoglycemia without coma: Secondary | ICD-10-CM | POA: Diagnosis present

## 2016-08-14 DIAGNOSIS — Z9842 Cataract extraction status, left eye: Secondary | ICD-10-CM

## 2016-08-14 DIAGNOSIS — Z961 Presence of intraocular lens: Secondary | ICD-10-CM | POA: Diagnosis present

## 2016-08-14 DIAGNOSIS — Z9841 Cataract extraction status, right eye: Secondary | ICD-10-CM

## 2016-08-14 DIAGNOSIS — I5043 Acute on chronic combined systolic (congestive) and diastolic (congestive) heart failure: Secondary | ICD-10-CM | POA: Diagnosis present

## 2016-08-14 DIAGNOSIS — L89152 Pressure ulcer of sacral region, stage 2: Secondary | ICD-10-CM | POA: Diagnosis present

## 2016-08-14 DIAGNOSIS — C799 Secondary malignant neoplasm of unspecified site: Secondary | ICD-10-CM | POA: Diagnosis present

## 2016-08-14 DIAGNOSIS — Z9981 Dependence on supplemental oxygen: Secondary | ICD-10-CM

## 2016-08-14 DIAGNOSIS — E43 Unspecified severe protein-calorie malnutrition: Secondary | ICD-10-CM | POA: Diagnosis present

## 2016-08-14 DIAGNOSIS — Z8546 Personal history of malignant neoplasm of prostate: Secondary | ICD-10-CM

## 2016-08-14 DIAGNOSIS — Z681 Body mass index (BMI) 19 or less, adult: Secondary | ICD-10-CM

## 2016-08-14 DIAGNOSIS — L8993 Pressure ulcer of unspecified site, stage 3: Secondary | ICD-10-CM | POA: Diagnosis present

## 2016-08-14 DIAGNOSIS — Z794 Long term (current) use of insulin: Secondary | ICD-10-CM

## 2016-08-14 DIAGNOSIS — Z8249 Family history of ischemic heart disease and other diseases of the circulatory system: Secondary | ICD-10-CM

## 2016-08-14 DIAGNOSIS — I110 Hypertensive heart disease with heart failure: Principal | ICD-10-CM | POA: Diagnosis present

## 2016-08-14 DIAGNOSIS — Z7982 Long term (current) use of aspirin: Secondary | ICD-10-CM

## 2016-08-14 DIAGNOSIS — Z880 Allergy status to penicillin: Secondary | ICD-10-CM

## 2016-08-14 DIAGNOSIS — C61 Malignant neoplasm of prostate: Secondary | ICD-10-CM | POA: Diagnosis present

## 2016-08-14 DIAGNOSIS — Z955 Presence of coronary angioplasty implant and graft: Secondary | ICD-10-CM

## 2016-08-14 DIAGNOSIS — J9621 Acute and chronic respiratory failure with hypoxia: Secondary | ICD-10-CM | POA: Diagnosis present

## 2016-08-14 DIAGNOSIS — Z66 Do not resuscitate: Secondary | ICD-10-CM | POA: Diagnosis present

## 2016-08-14 DIAGNOSIS — R7989 Other specified abnormal findings of blood chemistry: Secondary | ICD-10-CM

## 2016-08-14 DIAGNOSIS — Z91048 Other nonmedicinal substance allergy status: Secondary | ICD-10-CM

## 2016-08-14 DIAGNOSIS — Z87891 Personal history of nicotine dependence: Secondary | ICD-10-CM

## 2016-08-14 DIAGNOSIS — Z882 Allergy status to sulfonamides status: Secondary | ICD-10-CM

## 2016-08-14 DIAGNOSIS — F329 Major depressive disorder, single episode, unspecified: Secondary | ICD-10-CM | POA: Diagnosis present

## 2016-08-14 DIAGNOSIS — R778 Other specified abnormalities of plasma proteins: Secondary | ICD-10-CM

## 2016-08-14 DIAGNOSIS — L89612 Pressure ulcer of right heel, stage 2: Secondary | ICD-10-CM | POA: Diagnosis present

## 2016-08-14 DIAGNOSIS — R0602 Shortness of breath: Secondary | ICD-10-CM

## 2016-08-14 DIAGNOSIS — Z993 Dependence on wheelchair: Secondary | ICD-10-CM

## 2016-08-14 DIAGNOSIS — L89103 Pressure ulcer of unspecified part of back, stage 3: Secondary | ICD-10-CM | POA: Diagnosis present

## 2016-08-14 DIAGNOSIS — E162 Hypoglycemia, unspecified: Secondary | ICD-10-CM

## 2016-08-14 DIAGNOSIS — J440 Chronic obstructive pulmonary disease with acute lower respiratory infection: Secondary | ICD-10-CM | POA: Diagnosis present

## 2016-08-14 DIAGNOSIS — E119 Type 2 diabetes mellitus without complications: Secondary | ICD-10-CM

## 2016-08-14 DIAGNOSIS — J189 Pneumonia, unspecified organism: Secondary | ICD-10-CM | POA: Diagnosis present

## 2016-08-14 DIAGNOSIS — Z466 Encounter for fitting and adjustment of urinary device: Secondary | ICD-10-CM

## 2016-08-14 DIAGNOSIS — Z79899 Other long term (current) drug therapy: Secondary | ICD-10-CM

## 2016-08-14 DIAGNOSIS — I4581 Long QT syndrome: Secondary | ICD-10-CM | POA: Diagnosis present

## 2016-08-14 DIAGNOSIS — E785 Hyperlipidemia, unspecified: Secondary | ICD-10-CM | POA: Diagnosis present

## 2016-08-14 DIAGNOSIS — Z23 Encounter for immunization: Secondary | ICD-10-CM

## 2016-08-14 DIAGNOSIS — I2510 Atherosclerotic heart disease of native coronary artery without angina pectoris: Secondary | ICD-10-CM | POA: Diagnosis present

## 2016-08-14 DIAGNOSIS — Z888 Allergy status to other drugs, medicaments and biological substances status: Secondary | ICD-10-CM

## 2016-08-14 DIAGNOSIS — Z8673 Personal history of transient ischemic attack (TIA), and cerebral infarction without residual deficits: Secondary | ICD-10-CM

## 2016-08-14 DIAGNOSIS — E440 Moderate protein-calorie malnutrition: Secondary | ICD-10-CM | POA: Diagnosis present

## 2016-08-14 DIAGNOSIS — I10 Essential (primary) hypertension: Secondary | ICD-10-CM | POA: Diagnosis present

## 2016-08-14 DIAGNOSIS — K219 Gastro-esophageal reflux disease without esophagitis: Secondary | ICD-10-CM | POA: Diagnosis present

## 2016-08-14 NOTE — ED Notes (Signed)
Patient has large bandages noted on back. Spouse reports wound care performed by [facility] weekly.  Patient also has indwelling urinary catheter which is changed by urologist monthly.

## 2016-08-14 NOTE — ED Provider Notes (Addendum)
TIME SEEN:  By signing my name below, I, Guy [name], attest that this documentation has been prepared under the direction and in the presence of [employer], DO.  Electronically Signed: Julien [name], ED Scribe. 08/14/16. 11:34 PM.   CHIEF COMPLAINT:  Chief Complaint  Patient presents with  . Shortness of Breath     HPI:  HPI Comments: Guy [name] is a 82 y.o. male brought in by ambulance, who has a PMhx of CAD, COPD, HLD, HTN, DMII and prostate cancer presents to the Emergency Department complaining of sudden onset, gradual improving shortness of breath that started this evening when going to bed. Pt has an associated cough and nasal congestion. Per wife, pt kept saying "I can't breathe" repeatedly this evening when he was laying down for bed. He is on self refilling O2 at home at night. He normally wears 3 L/min [place] of O2 at home when trying to lay down for bed. Pt was asymptomatic today before he began to lay down. Pt is unable to ambulate on his own and is wheelchair bound at baseline and has a home aid 16-20 hrs a day. Wife notes pt was seen in the walk in clinic two days ago for chest congestion and cough. A chest x-ray was performed and he was told to start taking mucinex. Pt has been taking mucinex to help with his cold symptoms with relief. Wife notes pt has not received his flu shot this season. He has foley catheter that has been in place for the past five years. He denies chest pain or fever. Pt is a former smoker.   PCP: Dr. Sherral Hammers at the [facility] Endocrinologist: [name], MD Urologist: DAVID, Theodis Blaze, MD Cardiologist: Lorretta Harp, MD   ROS: See HPI Constitutional: no fever  Eyes: no drainage  ENT: no runny nose   Cardiovascular:  no chest pain  Resp: SOB  GI: no vomiting GU: no dysuria Integumentary: no rash  Allergy: no hives  Musculoskeletal: no leg swelling  Neurological: no slurred speech ROS otherwise negative  PAST MEDICAL HISTORY/PAST  SURGICAL HISTORY:  Past Medical History:  Diagnosis Date  . Arthritis   . Bladder cancer ([place])   . CAD (coronary artery disease) 1996   stent to the LAD   . COPD (chronic obstructive pulmonary disease) ([place])   . Decreased libido   . Depression   . Diabetes mellitus without complication ([place])   . ED (erectile dysfunction)   . Ejection fraction   . Gastric polyposis   . GERD (gastroesophageal reflux disease)   . Hepatitis A   . History of blood transfusion    1946  . Hyperlipidemia   . Hyperlipidemia   . Hypertension   . OA (osteoarthritis)   . Osteoporosis   . Prostate cancer ([place])    S/P prostatectomy; Lung metastasis  . Sciatica   . Stroke ([place])   . Type 2 diabetes mellitus ([place])   . Ulcer of left heel ([place])   . Urge incontinence   . Vertigo     MEDICATIONS:  Prior to Admission medications   Medication Sig Start Date End Date Taking? Authorizing Provider  amLODipine (NORVASC) 10 MG tablet Take 10 mg by mouth daily.    Historical Provider, MD  aspirin 81 MG tablet Take 81 mg by mouth daily. For heart theraphy    Historical Provider, MD  bicalutamide (CASODEX) 50 MG tablet Take 50 mg by mouth daily.    Historical Provider, MD  Calcium  Carbonate-Vitamin D (CALCIUM 600+D HIGH POTENCY) 600-400 MG-UNIT per tablet Take 1 tablet by mouth 2 (two) times daily.      Historical Provider, MD  camphor-menthol Timoteo Ace) lotion Apply 1 application topically daily as needed for itching.    Historical Provider, MD  ciprofloxacin (CIPRO) 500 MG tablet Take 1 tablet (500 mg total) by mouth 2 (two) times daily. 07/05/15   Leo Grosser, MD  CRANBERRY CONCENTRATE PO Take 1 capsule by mouth 3 (three) times daily.     Historical Provider, MD  cycloSPORINE (RESTASIS) 0.05 % ophthalmic emulsion Place 1 drop into both eyes 2 (two) times daily.    Historical Provider, MD  enzalutamide Gillermina Phy) 40 MG capsule Take 160 mg by mouth daily.    Historical Provider, MD  fluticasone (FLONASE) 50 MCG/ACT nasal  spray Place 2 sprays into the nose 2 (two) times daily as needed for allergies or rhinitis.     Historical Provider, MD  furosemide (LASIX) 20 MG tablet Take 1 tablet (20 mg total) by mouth daily. 01/30/16   Peter M Martinique, MD  gabapentin (NEURONTIN) 100 MG capsule Take 1-3 capsules by mouth at bedtime as needed (for itching).  02/24/15   Historical Provider, MD  insulin aspart (NOVOLOG) 100 UNIT/ML injection Inject 0-6 Units into the skin 3 (three) times daily with meals. 0-150=0; 151-200=2u; 201-250=3u; 251-300=4u; 301-350=5u; over 350 =6u prior to meals. With breakfast and lunch. 01/18/13   Gerlene Fee, NP  insulin glargine (LANTUS) 100 UNIT/ML injection Inject 18 Units into the skin daily with breakfast.    Historical Provider, MD  lipase/protease/amylase (CREON-12/PANCREASE) 12000 UNITS CPEP Take 1 capsule by mouth 3 (three) times daily. For AKI    Historical Provider, MD  losartan (COZAAR) 25 MG tablet Take 1 tablet (25 mg total) by mouth daily. 05/24/15   Ripudeep Krystal Eaton, MD  metoprolol succinate (TOPROL-XL) 25 MG 24 hr tablet Take 12.5 mg by mouth daily. For HTN    Historical Provider, MD  Multiple Vitamins-Minerals (HAIR/SKIN/NAILS) TABS Take 1 tablet by mouth 3 (three) times daily.    Historical Provider, MD  Multiple Vitamins-Minerals (MULTIVITAMINS THER. W/MINERALS) TABS Take 1 tablet by mouth every morning.     Historical Provider, MD  nitroGLYCERIN (NITROSTAT) 0.4 MG SL tablet Place 0.4 mg under the tongue every 5 (five) minutes as needed for chest pain.    Historical Provider, MD  omeprazole (PRILOSEC) 20 MG capsule Take 20 mg by mouth every other day. For GERD    Historical Provider, MD  polyethylene glycol (MIRALAX / GLYCOLAX) packet Take 17 g by mouth daily. 05/24/15   Ripudeep Krystal Eaton, MD  senna-docusate (SENOKOT S) 8.6-50 MG per tablet Take 1 tablet by mouth at bedtime. For constipation 05/24/15   Ripudeep Krystal Eaton, MD  sertraline (ZOLOFT) 100 MG tablet Take 200 mg by mouth daily. For  depression    Historical Provider, MD  simvastatin (ZOCOR) 20 MG tablet Take 10 mg by mouth every other day. Take 1/2 tablet =10 mg for HLD.    Historical Provider, MD  traMADol (ULTRAM) 50 MG tablet Take 1 tablet (50 mg total) by mouth every 6 (six) hours as needed for moderate pain or severe pain. 05/24/15   Ripudeep Krystal Eaton, MD    ALLERGIES:  Allergies  Allergen Reactions  . Albuterol Sulfate Hfa [Kdc:Albuterol] Shortness Of Breath and Swelling  . Adhesive [Tape] Other (See Comments)    REACTION: SKIN BLISTERS  . Albuterol Swelling    Per pt., side of his  neck began to swell with nebulized Albuterol in doctor's office.  . Contrast Media [Iodinated Diagnostic Agents] Other (See Comments)    unknown Reaction unknown  . Ioxaglate Other (See Comments)    Reaction unknown  . Lactose     Other reaction(s): GI Upset (intolerance)  . Metrizamide Other (See Comments)    Reaction unknown  . Penicillins Itching and Other (See Comments)    REACTION: ITCHING HANDS Has patient had a PCN reaction causing immediate rash, facial/tongue/throat swelling, SOB or lightheadedness with hypotension: No Has patient had a PCN reaction causing severe rash involving mucus membranes or skin necrosis: No Has patient had a PCN reaction that required hospitalization Yes Has patient had a PCN reaction occurring within the last 10 years: No If all of the above answers are "NO", then may proceed with Cephalosporin use.   . Sulfa Drugs Cross Reactors Hives  . Budesonide-Formoterol Fumarate Rash  . Sulfamethoxazole Rash    SOCIAL HISTORY:  Social History  Substance Use Topics  . Smoking status: Former Research scientist (life sciences)  . Smokeless tobacco: Never Used  . Alcohol use No    FAMILY HISTORY: Family History  Problem Relation Age of Onset  . Heart failure Mother   . Heart disease Mother   . Bladder Cancer Father   . Pancreatic cancer Sister   . Lung cancer Sister   . Breast cancer Daughter   . Liver disease Son      EXAM: BP 155/72   Pulse 71   Temp 98.5 F (36.9 C) (Oral)   Resp 23   SpO2 100%  CONSTITUTIONAL: Alert and oriented and responds appropriately to questions. Elderly; chronically ill-appearing HEAD: Normocephalic EYES: Conjunctivae clear, PERRL, EOMI ENT: normal nose; no rhinorrhea; moist mucous membranes NECK: Supple, no meningismus, no nuchal rigidity, no LAD  CARD: RRR; S1 and S2 appreciated; no murmurs, no clicks, no rubs, no gallops RESP: Normal chest excursion without splinting or tachypnea; breath sounds clear and equal bilaterally; no wheezes, no rhonchi, no rales, no hypoxia or respiratory distress, speaking full sentences ABD/GI: Normal bowel sounds; non-distended; soft, non-tender, no rebound, no guarding, no peritoneal signs, no hepatosplenomegaly BACK:  The back appears normal and is non-tender to palpation, there is no CVA tenderness, significant kyphosis  EXT: Normal ROM in all joints; non-tender to palpation; no edema; normal capillary refill; no cyanosis, no calf tenderness or swelling    SKIN: Normal color for age and race; warm; no rash NEURO: Moves all extremities equally, sensation to light touch intact diffusely, cranial nerves II through XII intact, normal speech PSYCH: The patient's mood and manner are appropriate. Grooming and personal hygiene are appropriate.  MEDICAL DECISION MAKING: Elderly man here with shortness of breath. Denies chest pain or chest discomfort. Reports at this time he is feeling much better and is asymptomatic. Wife reports recent cough, congestion. Seen in urgent care several days ago and had negative chest x-ray per her report. Discharged on Mucinex. Does not appear volume overloaded at this time. EKG shows no new ischemic abnormality. Plan is to obtain labs including troponin to evaluate as this could be patient's anginal equivalent. We'll also obtain a chest x-ray.   EKG Interpretation  Date/Time:  Wednesday August 14 2016 22:37:38  EST Ventricular Rate:  72 PR Interval:    QRS Duration: 143 QT Interval:  488 QTC Calculation: 535 R Axis:   -94 Text Interpretation:  Sinus or ectopic atrial rhythm RBBB and LAFB Probable anteroseptal infarct, old No significant change since last  tracing Confirmed by Isauro Skelley,  DO, Chinmay Squier [phone]) on 08/14/2016 11:35:25 PM        ED PROGRESS: 12:30 AM  Patient's troponin is 0.83. It appears has been elevated in the past but is higher than normal. We will give patient aspirin.  Hemodynamically stable. Asymptomatic. Other labs, chest x-ray pending.   1:54 AM Pt expresses that he feels fine currently.  Pt and family agree with admission.  They state that they would have a cardiac catheterization indicated. CXR clear. He does appear to have had several positive troponins before but this appears to be higher than normal. We'll discuss with medicine for admission.   2:30 AM  Discussed patient's case with hospitalist, Dr. Blaine Hamper.  Recommend admission to telemetry, inpatient bed.  I will place holding orders per their request. Patient and family (if present) updated with plan. Care transferred to hospitalist service.  I reviewed all nursing notes, vitals, pertinent old records, EKGs, labs, imaging (as available).  I personally performed the services described in this documentation, which was scribed in my presence. The recorded information has been reviewed and is accurate.     [place], DO 08/15/16 0323   3:35 AM  Pt's blood sugar was 54 on his chemistry panel. Recheck now is 7. Still mentating. We'll give an amp of D50 and continue to check this closely.    [place], DO 08/15/16 [phone]

## 2016-08-14 NOTE — ED Triage Notes (Signed)
Per EMS:  Pt presents to ED for assessment of SOB.  Pt has an extensive hx, including DM, COPD, and CHF.  Pt has a home aid 16-20 hours per day.  Pt placed on O2 at night for bed.  Wife noted pt working harder to breathe after placed in bed.  EMS sts lungs diminished, but no wheezing or rhonchi noted.  Pt does have an indwelling urinary catheter, and is currently on abx for "sores on his back"

## 2016-08-15 ENCOUNTER — Inpatient Hospital Stay (HOSPITAL_COMMUNITY): Payer: Medicare Other

## 2016-08-15 DIAGNOSIS — J189 Pneumonia, unspecified organism: Secondary | ICD-10-CM | POA: Diagnosis not present

## 2016-08-15 DIAGNOSIS — L89152 Pressure ulcer of sacral region, stage 2: Secondary | ICD-10-CM | POA: Diagnosis present

## 2016-08-15 DIAGNOSIS — L89103 Pressure ulcer of unspecified part of back, stage 3: Secondary | ICD-10-CM | POA: Diagnosis present

## 2016-08-15 DIAGNOSIS — E162 Hypoglycemia, unspecified: Secondary | ICD-10-CM

## 2016-08-15 DIAGNOSIS — R748 Abnormal levels of other serum enzymes: Secondary | ICD-10-CM | POA: Diagnosis not present

## 2016-08-15 DIAGNOSIS — C61 Malignant neoplasm of prostate: Secondary | ICD-10-CM | POA: Diagnosis not present

## 2016-08-15 DIAGNOSIS — E119 Type 2 diabetes mellitus without complications: Secondary | ICD-10-CM

## 2016-08-15 DIAGNOSIS — Z681 Body mass index (BMI) 19 or less, adult: Secondary | ICD-10-CM | POA: Diagnosis not present

## 2016-08-15 DIAGNOSIS — I5043 Acute on chronic combined systolic (congestive) and diastolic (congestive) heart failure: Secondary | ICD-10-CM

## 2016-08-15 DIAGNOSIS — E440 Moderate protein-calorie malnutrition: Secondary | ICD-10-CM | POA: Diagnosis present

## 2016-08-15 DIAGNOSIS — J9621 Acute and chronic respiratory failure with hypoxia: Secondary | ICD-10-CM

## 2016-08-15 DIAGNOSIS — I509 Heart failure, unspecified: Secondary | ICD-10-CM | POA: Diagnosis not present

## 2016-08-15 DIAGNOSIS — K219 Gastro-esophageal reflux disease without esophagitis: Secondary | ICD-10-CM

## 2016-08-15 DIAGNOSIS — I10 Essential (primary) hypertension: Secondary | ICD-10-CM

## 2016-08-15 DIAGNOSIS — L89612 Pressure ulcer of right heel, stage 2: Secondary | ICD-10-CM | POA: Diagnosis present

## 2016-08-15 DIAGNOSIS — Z794 Long term (current) use of insulin: Secondary | ICD-10-CM

## 2016-08-15 DIAGNOSIS — Z87891 Personal history of nicotine dependence: Secondary | ICD-10-CM | POA: Diagnosis not present

## 2016-08-15 DIAGNOSIS — R0602 Shortness of breath: Secondary | ICD-10-CM | POA: Diagnosis present

## 2016-08-15 DIAGNOSIS — F329 Major depressive disorder, single episode, unspecified: Secondary | ICD-10-CM | POA: Diagnosis present

## 2016-08-15 DIAGNOSIS — E11649 Type 2 diabetes mellitus with hypoglycemia without coma: Secondary | ICD-10-CM | POA: Diagnosis present

## 2016-08-15 DIAGNOSIS — Z961 Presence of intraocular lens: Secondary | ICD-10-CM | POA: Diagnosis present

## 2016-08-15 DIAGNOSIS — Z23 Encounter for immunization: Secondary | ICD-10-CM | POA: Diagnosis present

## 2016-08-15 DIAGNOSIS — L8993 Pressure ulcer of unspecified site, stage 3: Secondary | ICD-10-CM | POA: Diagnosis present

## 2016-08-15 DIAGNOSIS — J440 Chronic obstructive pulmonary disease with acute lower respiratory infection: Secondary | ICD-10-CM | POA: Diagnosis present

## 2016-08-15 DIAGNOSIS — Z9981 Dependence on supplemental oxygen: Secondary | ICD-10-CM | POA: Diagnosis not present

## 2016-08-15 DIAGNOSIS — I110 Hypertensive heart disease with heart failure: Secondary | ICD-10-CM | POA: Diagnosis present

## 2016-08-15 DIAGNOSIS — C799 Secondary malignant neoplasm of unspecified site: Secondary | ICD-10-CM | POA: Diagnosis present

## 2016-08-15 DIAGNOSIS — E785 Hyperlipidemia, unspecified: Secondary | ICD-10-CM | POA: Diagnosis present

## 2016-08-15 DIAGNOSIS — Z993 Dependence on wheelchair: Secondary | ICD-10-CM | POA: Diagnosis not present

## 2016-08-15 DIAGNOSIS — Z66 Do not resuscitate: Secondary | ICD-10-CM | POA: Diagnosis present

## 2016-08-15 DIAGNOSIS — E43 Unspecified severe protein-calorie malnutrition: Secondary | ICD-10-CM | POA: Diagnosis present

## 2016-08-15 DIAGNOSIS — I2510 Atherosclerotic heart disease of native coronary artery without angina pectoris: Secondary | ICD-10-CM | POA: Diagnosis present

## 2016-08-15 DIAGNOSIS — I4581 Long QT syndrome: Secondary | ICD-10-CM | POA: Diagnosis present

## 2016-08-15 LAB — TROPONIN I
TROPONIN I: 0.12 ng/mL — AB (ref <0.03)
TROPONIN I: 0.12 ng/mL — AB (ref <0.03)
Troponin I: 0.11 ng/mL (ref <0.03)
Troponin I: 0.12 ng/mL (ref <0.03)

## 2016-08-15 LAB — LIPID PANEL
Cholesterol: 95 mg/dL (ref 0–200)
HDL: 35 mg/dL — ABNORMAL LOW (ref >40)
LDL CALC: 45 mg/dL (ref 0–99)
Total CHOL/HDL Ratio: 2.7 RATIO
Triglycerides: 73 mg/dL (ref <150)
VLDL: 15 mg/dL (ref 0–40)

## 2016-08-15 LAB — URINALYSIS, ROUTINE W REFLEX MICROSCOPIC
BILIRUBIN URINE: NEGATIVE
GLUCOSE, UA: NEGATIVE mg/dL
KETONES UR: NEGATIVE mg/dL
Nitrite: POSITIVE — AB
PH: 5.5 (ref 5.0–8.0)
Protein, ur: NEGATIVE mg/dL
SPECIFIC GRAVITY, URINE: 1.017 (ref 1.005–1.030)

## 2016-08-15 LAB — CBC WITH DIFFERENTIAL/PLATELET
BASOS ABS: 0 10*3/uL (ref 0.0–0.1)
Basophils Relative: 0 %
Eosinophils Absolute: 0.7 10*3/uL (ref 0.0–0.7)
Eosinophils Relative: 6 %
HEMATOCRIT: 31.3 % — AB (ref 39.0–52.0)
Hemoglobin: 10.5 g/dL — ABNORMAL LOW (ref 13.0–17.0)
LYMPHS ABS: 1.1 10*3/uL (ref 0.7–4.0)
LYMPHS PCT: 10 %
MCH: 33.1 pg (ref 26.0–34.0)
MCHC: 33.5 g/dL (ref 30.0–36.0)
MCV: 98.7 fL (ref 78.0–100.0)
MONO ABS: 0.7 10*3/uL (ref 0.1–1.0)
Monocytes Relative: 6 %
NEUTROS ABS: 9.1 10*3/uL — AB (ref 1.7–7.7)
Neutrophils Relative %: 78 %
Platelets: 144 10*3/uL — ABNORMAL LOW (ref 150–400)
RBC: 3.17 MIL/uL — AB (ref 4.22–5.81)
RDW: 15.3 % (ref 11.5–15.5)
WBC: 11.6 10*3/uL — AB (ref 4.0–10.5)

## 2016-08-15 LAB — BASIC METABOLIC PANEL
ANION GAP: 6 (ref 5–15)
BUN: 38 mg/dL — AB (ref 6–20)
CO2: 26 mmol/L (ref 22–32)
Calcium: 8.8 mg/dL — ABNORMAL LOW (ref 8.9–10.3)
Chloride: 105 mmol/L (ref 101–111)
Creatinine, Ser: 1.07 mg/dL (ref 0.61–1.24)
GFR calc Af Amer: >60 mL/min (ref >60)
GFR calc non Af Amer: 58 mL/min — ABNORMAL LOW (ref >60)
GLUCOSE: 54 mg/dL — AB (ref 65–99)
POTASSIUM: 3.7 mmol/L (ref 3.5–5.1)
Sodium: 137 mmol/L (ref 135–145)

## 2016-08-15 LAB — I-STAT TROPONIN, ED: Troponin i, poc: 0.83 ng/mL (ref 0.00–0.08)

## 2016-08-15 LAB — URINE MICROSCOPIC-ADD ON

## 2016-08-15 LAB — CBG MONITORING, ED
GLUCOSE-CAPILLARY: 38 mg/dL — AB (ref 65–99)
GLUCOSE-CAPILLARY: 112 mg/dL — AB (ref 65–99)
GLUCOSE-CAPILLARY: 59 mg/dL — AB (ref 65–99)
GLUCOSE-CAPILLARY: 110 mg/dL — AB (ref 65–99)
Glucose-Capillary: 77 mg/dL (ref 65–99)
Glucose-Capillary: 133 mg/dL — ABNORMAL HIGH (ref 65–99)
Glucose-Capillary: 128 mg/dL — ABNORMAL HIGH (ref 65–99)

## 2016-08-15 LAB — GLUCOSE, CAPILLARY: Glucose-Capillary: 147 mg/dL — ABNORMAL HIGH (ref 65–99)

## 2016-08-15 LAB — STREP PNEUMONIAE URINARY ANTIGEN: Strep Pneumo Urinary Antigen: NEGATIVE

## 2016-08-15 LAB — LACTIC ACID, PLASMA: Lactic Acid, Venous: 0.8 mmol/L (ref 0.5–1.9)

## 2016-08-15 LAB — BRAIN NATRIURETIC PEPTIDE: B NATRIURETIC PEPTIDE 5: 532.2 pg/mL — AB (ref 0.0–100.0)

## 2016-08-15 MED ORDER — SODIUM CHLORIDE 0.9% FLUSH: 3.0000 mL | INTRAVENOUS | Status: DC | PRN

## 2016-08-15 MED ORDER — DM-GUAIFENESIN ER 30-600 MG PO TB12: 1.0000 | ORAL_TABLET | Freq: Two times a day (BID) | ORAL | Status: DC | PRN

## 2016-08-15 MED ORDER — SENNOSIDES-DOCUSATE SODIUM 8.6-50 MG PO TABS
1.0000 | ORAL_TABLET | Freq: Every day | ORAL | Status: DC
  Administered 2016-08-15: 1 via ORAL
  Filled 2016-08-15 (×3): qty 1

## 2016-08-15 MED ORDER — METOPROLOL SUCCINATE ER 25 MG PO TB24
12.5000 mg | ORAL_TABLET | Freq: Every day | ORAL | Status: DC
  Administered 2016-08-15 – 2016-08-18 (×3): 12.5 mg via ORAL
  Filled 2016-08-15 (×4): qty 1

## 2016-08-15 MED ORDER — PANCRELIPASE (LIP-PROT-AMYL) 12000-38000 UNITS PO CPEP
12000.0000 [IU] | ORAL_CAPSULE | Freq: Three times a day (TID) | ORAL | Status: DC
Start: 2016-08-15 — End: 2016-08-18
  Administered 2016-08-15 – 2016-08-18 (×9): 12000 [IU] via ORAL
  Filled 2016-08-15 (×12): qty 1

## 2016-08-15 MED ORDER — SODIUM CHLORIDE 0.9% FLUSH
3.0000 mL | Freq: Two times a day (BID) | INTRAVENOUS | Status: DC
Start: 2016-08-15 — End: 2016-08-18
  Administered 2016-08-15 – 2016-08-18 (×6): 3 mL via INTRAVENOUS

## 2016-08-15 MED ORDER — FUROSEMIDE 10 MG/ML IJ SOLN
20.0000 mg | Freq: Two times a day (BID) | INTRAMUSCULAR | Status: DC
  Administered 2016-08-15 – 2016-08-16 (×3): 20 mg via INTRAVENOUS
  Filled 2016-08-15 (×3): qty 2

## 2016-08-15 MED ORDER — LISINOPRIL 2.5 MG PO TABS
2.5000 mg | ORAL_TABLET | Freq: Every day | ORAL | Status: DC
  Administered 2016-08-15 – 2016-08-16 (×2): 2.5 mg via ORAL
  Filled 2016-08-15 (×3): qty 1

## 2016-08-15 MED ORDER — PANTOPRAZOLE SODIUM 40 MG PO TBEC
40.0000 mg | DELAYED_RELEASE_TABLET | Freq: Every day | ORAL | Status: DC
Start: 2016-08-15 — End: 2016-08-18
  Administered 2016-08-15 – 2016-08-18 (×4): 40 mg via ORAL
  Filled 2016-08-15 (×4): qty 1

## 2016-08-15 MED ORDER — NITROGLYCERIN 0.4 MG SL SUBL: 0.4000 mg | SUBLINGUAL_TABLET | SUBLINGUAL | Status: DC | PRN

## 2016-08-15 MED ORDER — ADULT MULTIVITAMIN W/MINERALS CH
1.0000 | ORAL_TABLET | Freq: Every day | ORAL | Status: DC
  Administered 2016-08-15 – 2016-08-18 (×4): 1 via ORAL
  Filled 2016-08-15 (×4): qty 1

## 2016-08-15 MED ORDER — ACETAMINOPHEN 325 MG PO TABS: 650.0000 mg | ORAL_TABLET | ORAL | Status: DC | PRN

## 2016-08-15 MED ORDER — SIMVASTATIN 10 MG PO TABS
10.0000 mg | ORAL_TABLET | ORAL | Status: DC
  Administered 2016-08-16 – 2016-08-18 (×2): 10 mg via ORAL
  Filled 2016-08-15 (×2): qty 1

## 2016-08-15 MED ORDER — THERA M PLUS PO TABS: 1.0000 | ORAL_TABLET | Freq: Every morning | ORAL | Status: DC

## 2016-08-15 MED ORDER — COLLAGENASE 250 UNIT/GM EX OINT
TOPICAL_OINTMENT | Freq: Every day | CUTANEOUS | Status: DC
  Administered 2016-08-15 – 2016-08-18 (×4): via TOPICAL
  Filled 2016-08-15 (×2): qty 30

## 2016-08-15 MED ORDER — LEVOFLOXACIN 750 MG PO TABS
750.0000 mg | ORAL_TABLET | Freq: Once | ORAL | Status: AC
  Administered 2016-08-15: 750 mg via ORAL
  Filled 2016-08-15: qty 1

## 2016-08-15 MED ORDER — GLUCERNA SHAKE PO LIQD
237.0000 mL | Freq: Two times a day (BID) | ORAL | Status: DC
  Administered 2016-08-16: 237 mL via ORAL

## 2016-08-15 MED ORDER — AMLODIPINE BESYLATE 10 MG PO TABS
10.0000 mg | ORAL_TABLET | Freq: Every day | ORAL | Status: DC
  Administered 2016-08-15 – 2016-08-16 (×2): 10 mg via ORAL
  Filled 2016-08-15: qty 2
  Filled 2016-08-15: qty 1

## 2016-08-15 MED ORDER — GABAPENTIN 100 MG PO CAPS
100.0000 mg | ORAL_CAPSULE | Freq: Three times a day (TID) | ORAL | Status: DC
  Administered 2016-08-15 – 2016-08-18 (×10): 100 mg via ORAL
  Filled 2016-08-15 (×10): qty 1

## 2016-08-15 MED ORDER — DEXTROSE 50 % IV SOLN: 50.0000 mL | INTRAVENOUS | Status: DC | PRN

## 2016-08-15 MED ORDER — GLUCERNA PO LIQD: 237.0000 mL | Freq: Two times a day (BID) | ORAL | Status: DC

## 2016-08-15 MED ORDER — CRANBERRY 500 MG PO CAPS: 1.0000 | ORAL_CAPSULE | Freq: Three times a day (TID) | ORAL | Status: DC

## 2016-08-15 MED ORDER — DEXTROSE 50 % IV SOLN
INTRAVENOUS | Status: AC
  Filled 2016-08-15: qty 50

## 2016-08-15 MED ORDER — SODIUM CHLORIDE 0.9 % IV SOLN: 250.0000 mL | INTRAVENOUS | Status: DC | PRN

## 2016-08-15 MED ORDER — BICALUTAMIDE 50 MG PO TABS
50.0000 mg | ORAL_TABLET | Freq: Every day | ORAL | Status: DC
  Administered 2016-08-15 – 2016-08-18 (×4): 50 mg via ORAL
  Filled 2016-08-15 (×5): qty 1

## 2016-08-15 MED ORDER — ENOXAPARIN SODIUM 30 MG/0.3ML ~~LOC~~ SOLN
30.0000 mg | Freq: Every day | SUBCUTANEOUS | Status: DC
  Administered 2016-08-16 – 2016-08-18 (×3): 30 mg via SUBCUTANEOUS
  Filled 2016-08-15 (×4): qty 0.3

## 2016-08-15 MED ORDER — LEVOFLOXACIN 500 MG PO TABS: 500.0000 mg | ORAL_TABLET | ORAL | Status: DC

## 2016-08-15 MED ORDER — ZOLPIDEM TARTRATE 5 MG PO TABS: 5.0000 mg | ORAL_TABLET | Freq: Every evening | ORAL | Status: DC | PRN

## 2016-08-15 MED ORDER — CALCIUM CARBONATE-VITAMIN D 500-200 MG-UNIT PO TABS
1.0000 | ORAL_TABLET | Freq: Two times a day (BID) | ORAL | Status: DC
  Administered 2016-08-15 – 2016-08-18 (×6): 1 via ORAL
  Filled 2016-08-15 (×9): qty 1

## 2016-08-15 MED ORDER — ASPIRIN EC 81 MG PO TBEC
81.0000 mg | DELAYED_RELEASE_TABLET | Freq: Every day | ORAL | Status: DC
  Administered 2016-08-15 – 2016-08-18 (×4): 81 mg via ORAL
  Filled 2016-08-15 (×4): qty 1

## 2016-08-15 MED ORDER — ASPIRIN 81 MG PO CHEW
324.0000 mg | CHEWABLE_TABLET | Freq: Once | ORAL | Status: AC
  Administered 2016-08-15: 324 mg via ORAL
  Filled 2016-08-15: qty 4

## 2016-08-15 MED ORDER — DEXTROSE 50 % IV SOLN
1.0000 | Freq: Once | INTRAVENOUS | Status: AC
  Administered 2016-08-15: 50 mL via INTRAVENOUS

## 2016-08-15 MED ORDER — IPRATROPIUM BROMIDE 0.02 % IN SOLN: 0.5000 mg | RESPIRATORY_TRACT | Status: DC | PRN

## 2016-08-15 NOTE — ED Notes (Signed)
Breakfast tray ordered for patient.

## 2016-08-15 NOTE — ED Notes (Signed)
Pt's wife and aide are at the bedside trying to assist the pt to eat more of his breakfast.

## 2016-08-15 NOTE — Consult Note (Signed)
[facility] Nurse wound consult note Reason for Consult: back wounds, per aide and spouse has dx of some type of blistering skin disease.  I have questioned this with family but they are not able to give me the name of any of the most common skin blistering issues. He is noted to have a pressure injury to the thoracic spinal area, moist likely related to kyphosis.  Has a pressure injury on right heel as well.  Has paid caregivers at home and [facility] Wound type: Unstageable pressure injury thoracic spine, along with scattered  partial thickness skin loss over the entire back but these areas appear to be healing, clean Healing Stage 2 pressure injury right heel, was reported by CNA to be serous filled blister when it started Pressure Ulcer POA: Yes x 2 Measurement: Thoracic spine: 3.0cm x 2.0cm x 0.1cm  Right heel: 0.5cmx 0.5cm x 0 Wound bed: Thoracic spine: 100% yellow/grey Right heel: intact skin, resolving blister Drainage (amount, consistency, odor) none from the heel; moderate serous from the back wounds  Periwound:intact, noted to have dark purple area; ecchymosis over the bilateral sholders Dressing procedure/placement/frequency: Continue enzymatic debridement ointment to the thoracic spine wound, cover wound and other affected areas over the back with silicone foam.  Silicone foam to the right heel for protection.  Low air loss mattress for pressure redistribution for the spinal wound.  Discussed POC with patient and bedside nurse.  Re consult if needed, will not follow at this time. Thanks  Johncarlos Holtsclaw [employer], RN,CWOCN, CNS [phone])

## 2016-08-15 NOTE — Progress Notes (Signed)
Patient seen and evaluated in ED.  Voices that he feels about the same as to when he came in.  Wife and caregiver are present.  State patient was saying he was short of breath most of last night.  Wears 2L [place] at home.  Has chronic foley catheter.  No tachypneic or tachycardic.  Asking for Glucerna shakes between meals.  Patient's caregiver voices that he has a blistering skin condition in while he takes a medication (Minocycline) for.  Patient is a very thin, frail, cachectic male.  Wife voices that he has an appointment to see cardiologist at the end of November. Will await response to IV Lasix and antibiotics and if no improvement noted can consider CT scan of chest.  Troponin leveled off.

## 2016-08-15 NOTE — Evaluation (Addendum)
Physical Therapy Evaluation Patient Details Name: Guy [name] MRN: [id number] DOB: Apr 06, 1920 Today's Date: 08/15/2016   History of Present Illness  pt presents with Acute on Chronic Respiratory Failure.  pt with hx of CHF, HTN, DM, COPD, Depression, CVA, Prostate CA with Mets, Bladder CA, Indwelling Catheter, CAD, Hep A, L Heel Ulcer, and Vertigo.    Clinical Impression  Pt very frail appearing and very hard of hearing making it difficult to determine baseline PLOF.  Per RN pt lives with his wife and they have aides for majority of the day, but unclear level of A aides were providing or what they are able to provide for pt.  At this time feel pt will need SNF level of care unless family and aides are able to provide that high of a level of A for pt.  Unclear DME needs at this time, so will need to f/u with family.  Will continue to follow.      Follow Up Recommendations SNF    Equipment Recommendations  Rolling walker with 5" wheels;3in1 (PT);Wheelchair (measurements PT);Wheelchair cushion (measurements PT)    Recommendations for Other Services       Precautions / Restrictions Precautions Precautions: Fall Precaution Comments: pt is very HOH. Restrictions Weight Bearing Restrictions: No      Mobility  Bed Mobility Overal bed mobility: Needs Assistance;+2 for physical assistance Bed Mobility: Supine to Sit;Sit to Supine     Supine to sit: Max assist Sit to supine: Max assist;+2 for physical assistance   General bed mobility comments: pt does attempt to participate in bed mobility, but does require extensive A for mobility.    Transfers Overall transfer level: Needs assistance Equipment used: 2 person hand held assist Transfers: Sit to/from Stand Sit to Stand: Mod assist;+2 physical assistance;From elevated surface         General transfer comment: pt needs Bil feet blocked as he tends to lean posteriorly.  pt remains quite flexed in standing and anticipate this is  baseline for pt, but he was unable to state.    Ambulation/Gait                Stairs            Wheelchair Mobility    Modified Rankin (Stroke Patients Only)       Balance Overall balance assessment: Needs assistance Sitting-balance support: Bilateral upper extremity supported;Feet supported Sitting balance-Leahy Scale: Poor   Postural control: Posterior lean Standing balance support: Bilateral upper extremity supported;During functional activity Standing balance-Leahy Scale: Poor                               Pertinent Vitals/Pain Pain Assessment: Faces Faces Pain Scale: No hurt    Home Living Family/patient expects to be discharged to:: Unsure                 Additional Comments: Unclear home environment, but per RN pt has a wife and that they have aides daily for most of the day.      Prior Function Level of Independence: Needs assistance   Gait / Transfers Assistance Needed: pt unable to state.  ADL's / Homemaking Assistance Needed: Per RN pt has aides daily, but unclear how much pt was able to participate in ADLs.          Hand Dominance        Extremity/Trunk Assessment   Upper Extremity Assessment: Defer to OT  evaluation           Lower Extremity Assessment: Generalized weakness;Difficult to assess due to impaired cognition (Difficult to assess due to impaired hearing.)      Cervical / Trunk Assessment: Kyphotic  Communication   Communication: HOH  Cognition Arousal/Alertness: Awake/alert Behavior During Therapy: WFL for tasks assessed/performed Overall Cognitive Status: Difficult to assess                      General Comments      Exercises     Assessment/Plan    PT Assessment Patient needs continued PT services  PT Problem List Decreased strength;Decreased activity tolerance;Decreased balance;Decreased mobility;Decreased coordination;Decreased cognition;Decreased knowledge of use of  DME;Cardiopulmonary status limiting activity;Decreased skin integrity          PT Treatment Interventions DME instruction;Gait training;Stair training;Functional mobility training;Therapeutic activities;Therapeutic exercise;Balance training;Patient/family education    PT Goals (Current goals can be found in the Care Plan section)  Acute Rehab PT Goals Patient Stated Goal: pt unable to state. PT Goal Formulation: Patient unable to participate in goal setting Time For Goal Achievement: 08/29/16 Potential to Achieve Goals: Fair    Frequency Min 3X/week   Barriers to discharge        Co-evaluation               End of Session Equipment Utilized During Treatment: Gait belt;Oxygen Activity Tolerance: Patient tolerated treatment well Patient left: in bed;with call bell/phone within reach Nurse Communication: Mobility status         Time: 7322-0254 PT Time Calculation (min) (ACUTE ONLY): 13 min   Charges:   PT Evaluation $PT Eval Moderate Complexity: 1 Procedure     PT G CodesThornton Papas Kerianna Rawlinson, PT [phone] 08/15/2016, 10:08 AM

## 2016-08-15 NOTE — ED Notes (Signed)
Breakfast tray at bedside 

## 2016-08-15 NOTE — ED Notes (Signed)
CBG 38. RN and MD notified

## 2016-08-15 NOTE — ED Notes (Signed)
Pt's CBG was 59. Pt given cranberry juice and graham crackers. This RN will recheck blood sugar in 15 minutes.

## 2016-08-15 NOTE — ED Notes (Addendum)
Patient's wife requesting glucerna or some type of nutritional shake that is diabetic appropriate since patient is not eating much of meal tray. Dr. Marily Memos made aware

## 2016-08-15 NOTE — ED Notes (Signed)
Portable x-ray at bedside at this time.

## 2016-08-15 NOTE — ED Notes (Signed)
Breakfast set up at bedside, pt fed applesauce and coffee by this RN, pt stating he did not want the rest of his breakfast.

## 2016-08-15 NOTE — Progress Notes (Signed)
PHARMACIST - PHYSICIAN ORDER COMMUNICATION  CONCERNING: P&T Medication Policy on Herbal Medications  DESCRIPTION:  This patient's order for:  Cranberry  has been noted.  This product(s) is classified as an "herbal" or natural product. Due to a lack of definitive safety studies or FDA approval, nonstandard manufacturing practices, plus the potential risk of unknown drug-drug interactions while on inpatient medications, the Pharmacy and Therapeutics Committee does not permit the use of "herbal" or natural products of this type within [place].   ACTION TAKEN: The pharmacy department is unable to verify this order at this time and your patient has been informed of this safety policy. Please reevaluate patient's clinical condition at discharge and address if the herbal or natural product(s) should be resumed at that time.   

## 2016-08-15 NOTE — ED Notes (Signed)
PT at bedside.

## 2016-08-15 NOTE — ED Notes (Signed)
Melody, wound ostomy RN at bedside

## 2016-08-15 NOTE — Progress Notes (Signed)
Pt brought from ED from home. Admitted to [facility] room 23. Multiple wounds present on admission. Pt bilateral arms bruised. Left forearm skin tear- gauzed removed, open to air, healing. Pt has bilateral scabs on arms and legs. Pt has a foam dressing on right heel of foot, dressing removed to assess, no opening, normal skin color and tone. Pt has multiple stage II pressure ulcers lining the spinal column, sacrum is red and non blanchable. Pressure ulcers on the back are bleeding. Wounds cleaned and awaiting santyl from pharmacy to apply. Pt on specialty bed. Will continue to monitor. Skin assessment verified by second nurse Maurene Capes Nilda Simmer 08/15/16 1814

## 2016-08-15 NOTE — ED Notes (Signed)
Guy [name] with wound ostomy advised she would be down to see patient in about 30 minutes

## 2016-08-15 NOTE — H&P (Signed)
History and Physical    Guy [name] IFO:[id number] DOB: [birth year] DOA: 08/14/2016  Referring MD/NP/PA:   PCP: [name], MD   Patient coming from:  The patient is coming from home.  At baseline, pt is partially dependent for most of ADL.   Chief Complaint: SOB  HPI: Guy [name] is a 82 y.o. male with medical history significant of chronic combined systolic and diastolic CHF with EF of 67-67%, hypertension, hyperlipidemia, diabetes mellitus, COPD on 3L oxygen at night, GERD, depression, stroke, metastasized prostate cancer (s/p of surgery 1982, s/p of implant), indwelling foley catheter, CAD, s/p of stent placement, chronically elevated troponin (at baseline 0.2-0.3), who presents with a shortness of breath.  Patient has been having shortness stress in the past several days, which has been progressively getting worse. He has dry cough, but no chest pain. No fever or chills. Patient denies nausea, vomiting, abdominal pain, diarrhea, symptoms of UTI. No tenderness over calf areas.  ED Course: pt was found to have BNP 532.2, troponin 0.12, WBC 11.6, electrolytes renal function okay, temperature normal, no tachycardia, oxygen saturation 99% on room air. Hypoglycemia with blood sugar 54. CXR showed bilateral central airspace opacification raises concern for multifocal pneumonia, though interstitial edema might have a similar appearance. Pt is admitted to tele bed as inpt.  Review of Systems:   General: no fevers, chills, no changes in body weight, has poor appetite, has fatigue HEENT: no blurry vision, hearing changes or sore throat Respiratory: has dyspnea, coughing, no wheezing CV: no chest pain, no palpitations GI: no nausea, vomiting, abdominal pain, diarrhea, constipation GU: no dysuria, burning on urination, increased urinary frequency, hematuria  Ext: no leg edema Neuro: no unilateral weakness, numbness, or tingling, no vision change or hearing loss Skin: has pressure  ulcer in back MSK: No muscle spasm, no deformity, no limitation of range of movement in spin Heme: No easy bruising.  Travel history: No recent long distant travel.  Allergy:  Allergies  Allergen Reactions  . Albuterol Sulfate Hfa [Kdc:Albuterol] Shortness Of Breath and Swelling  . Adhesive [Tape] Other (See Comments)    REACTION: SKIN BLISTERS  . Albuterol Swelling    Per pt., side of his neck began to swell with nebulized Albuterol in doctor's office.  . Contrast Media [Iodinated Diagnostic Agents] Other (See Comments)    unknown Reaction unknown  . Ioxaglate Other (See Comments)    Reaction unknown  . Lactose     Other reaction(s): GI Upset (intolerance)  . Metrizamide Other (See Comments)    Reaction unknown  . Penicillins Itching and Other (See Comments)    REACTION: ITCHING HANDS Has patient had a PCN reaction causing immediate rash, facial/tongue/throat swelling, SOB or lightheadedness with hypotension: No Has patient had a PCN reaction causing severe rash involving mucus membranes or skin necrosis: No Has patient had a PCN reaction that required hospitalization Yes Has patient had a PCN reaction occurring within the last 10 years: No If all of the above answers are "NO", then may proceed with Cephalosporin use.   . Sulfa Drugs Cross Reactors Hives  . Budesonide-Formoterol Fumarate Rash  . Sulfamethoxazole Rash    Past Medical History:  Diagnosis Date  . Arthritis   . Bladder cancer ([place])   . CAD (coronary artery disease) 1996   stent to the LAD   . COPD (chronic obstructive pulmonary disease) ([place])   . Decreased libido   . Depression   . Diabetes mellitus without complication ([place])   .  ED (erectile dysfunction)   . Ejection fraction   . Gastric polyposis   . GERD (gastroesophageal reflux disease)   . Hepatitis A   . History of blood transfusion    1946  . Hyperlipidemia   . Hyperlipidemia   . Hypertension   . OA (osteoarthritis)   . Osteoporosis   .  Prostate cancer ([place])    S/P prostatectomy; Lung metastasis  . Sciatica   . Stroke ([place])   . Type 2 diabetes mellitus ([place])   . Ulcer of left heel ([place])   . Urge incontinence   . Vertigo     Past Surgical History:  Procedure Laterality Date  . APPENDECTOMY    . BLADDER SURGERY    . CARDIAC CATHETERIZATION  11/27/2001   patent stent with mild in-stent restenosis.  . CARPAL TUNNEL RELEASE    . CATARACT EXTRACTION W/ INTRAOCULAR LENS  IMPLANT, BILATERAL  1998  . CORONARY ANGIOPLASTY  1996   stenting to the LAD in [place].   [name] FINGER SURGERY     Left and right  . HERNIA REPAIR    . Left hip surgery     Donated bone for bone graft to arm  . MIDDLE EAR SURGERY    . myoview  06/12/2009   Persantine myoview EF 76%; Normal myoview  . PENILE PROSTHESIS IMPLANT     S/P removal and re-implantation of new prosthesis  . PROSTATECTOMY    . Right arm bone graft     Pathological fracture  . URINARY SPHINCTER IMPLANT    . Urinary sphincter implant revision      Social History:  reports that he has quit smoking. He has never used smokeless tobacco. He reports that he does not drink alcohol or use drugs.  Family History:  Family History  Problem Relation Age of Onset  . Heart failure Mother   . Heart disease Mother   . Bladder Cancer Father   . Pancreatic cancer Sister   . Lung cancer Sister   . Breast cancer Daughter   . Liver disease Son      Prior to Admission medications   Medication Sig Start Date End Date Taking? Authorizing Provider  amLODipine (NORVASC) 10 MG tablet Take 10 mg by mouth daily.   Yes Historical Provider, MD  aspirin 81 MG tablet Take 81 mg by mouth daily. For heart theraphy   Yes Historical Provider, MD  bicalutamide (CASODEX) 50 MG tablet Take 50 mg by mouth daily.   Yes Historical Provider, MD  Calcium Carbonate-Vitamin D (CALCIUM 600+D HIGH POTENCY) 600-400 MG-UNIT per tablet Take 1 tablet by mouth 2 (two) times daily.     Yes Historical Provider, MD    CRANBERRY CONCENTRATE PO Take 1 capsule by mouth 3 (three) times daily.    Yes Historical Provider, MD  gabapentin (NEURONTIN) 100 MG capsule Take 100 mg by mouth 3 (three) times daily.  02/24/15  Yes Historical Provider, MD  insulin aspart (NOVOLOG) 100 UNIT/ML injection Inject 0-6 Units into the skin 3 (three) times daily with meals. 0-150=0; 151-200=2u; 201-250=3u; 251-300=4u; 301-350=5u; over 350 =6u prior to meals. With breakfast and lunch. 01/18/13  Yes Gerlene Fee, NP  insulin glargine (LANTUS) 100 UNIT/ML injection Inject 16 Units into the skin daily with breakfast.    Yes Historical Provider, MD  metoprolol succinate (TOPROL-XL) 25 MG 24 hr tablet Take 12.5 mg by mouth daily. For HTN   Yes Historical Provider, MD  Multiple Vitamins-Minerals (HAIR/SKIN/NAILS) TABS Take 1  tablet by mouth 3 (three) times daily.   Yes Historical Provider, MD  Multiple Vitamins-Minerals (MULTIVITAMINS THER. W/MINERALS) TABS Take 1 tablet by mouth every morning.    Yes Historical Provider, MD  nitroGLYCERIN (NITROSTAT) 0.4 MG SL tablet Place 0.4 mg under the tongue every 5 (five) minutes as needed for chest pain.   Yes Historical Provider, MD  omeprazole (PRILOSEC) 20 MG capsule Take 20 mg by mouth every other day. For GERD   Yes Historical Provider, MD  Pancrelipase, Lip-Prot-Amyl, 5000 units CPEP Take 5,000 Units by mouth 3 (three) times daily.   Yes Historical Provider, MD  PRESCRIPTION MEDICATION Take 1 tablet by mouth 2 (two) times daily. Antibiotic   Yes Historical Provider, MD  senna-docusate (SENOKOT S) 8.6-50 MG per tablet Take 1 tablet by mouth at bedtime. For constipation 05/24/15  Yes Ripudeep Krystal Eaton, MD  sertraline (ZOLOFT) 100 MG tablet Take 200 mg by mouth daily. For depression   Yes Historical Provider, MD  simvastatin (ZOCOR) 20 MG tablet Take 10 mg by mouth every other day. Take 1/2 tablet =10 mg for HLD.   Yes Historical Provider, MD  ciprofloxacin (CIPRO) 500 MG tablet Take 1 tablet (500 mg  total) by mouth 2 (two) times daily. Patient not taking: Reported on 08/15/2016 07/05/15   Leo Grosser, MD  furosemide (LASIX) 20 MG tablet Take 1 tablet (20 mg total) by mouth daily. Patient not taking: Reported on 08/15/2016 01/30/16   Peter M Martinique, MD  losartan (COZAAR) 25 MG tablet Take 1 tablet (25 mg total) by mouth daily. Patient not taking: Reported on 08/15/2016 05/24/15   Ripudeep Krystal Eaton, MD  polyethylene glycol (MIRALAX / GLYCOLAX) packet Take 17 g by mouth daily. Patient not taking: Reported on 08/15/2016 05/24/15   Ripudeep Krystal Eaton, MD  traMADol (ULTRAM) 50 MG tablet Take 1 tablet (50 mg total) by mouth every 6 (six) hours as needed for moderate pain or severe pain. Patient not taking: Reported on 08/15/2016 05/24/15   Ripudeep Krystal Eaton, MD    Physical Exam: Vitals:   08/15/16 0200 08/15/16 0230 08/15/16 0300 08/15/16 0330  BP: 156/70 149/68 158/73 141/72  Pulse: 77 76 77 76  Resp: '24 25 25 23  '$ Temp:      TempSrc:      SpO2: 99% 100% 99% 99%   General: Not in acute distress HEENT:       Eyes: PERRL, EOMI, no scleral icterus.       ENT: No discharge from the ears and nose, no pharynx injection, no tonsillar enlargement.        Neck: positive JVD, no bruit, no mass felt. Heme: No neck lymph node enlargement. Cardiac: S1/S2, RRR, No murmurs, No gallops or rubs. Respiratory:  No rales, wheezing, rhonchi or rubs. GI: Soft, nondistended, nontender, no rebound pain, no organomegaly, BS present. GU: No hematuria Ext: No pitting leg edema bilaterally. 2+DP/PT pulse bilaterally. Musculoskeletal: No joint deformities, No joint redness or warmth, no limitation of ROM in spin. Skin: has pressure ulcer in back, stage -III Neuro: Alert, oriented X3, cranial nerves II-XII grossly intact, moves all extremities normally.  Psych: Patient is not psychotic, no suicidal or hemocidal ideation.  Labs on Admission: I have personally reviewed following labs and imaging studies  CBC:  Recent  Labs Lab 08/15/16 0008  WBC 11.6*  NEUTROABS 9.1*  HGB 10.5*  HCT 31.3*  MCV 98.7  PLT 992*   Basic Metabolic Panel:  Recent Labs Lab 08/15/16 0008  NA  137  K 3.7  CL 105  CO2 26  GLUCOSE 54*  BUN 38*  CREATININE 1.07  CALCIUM 8.8*   GFR: CrCl cannot be calculated (Unknown ideal weight.). Liver Function Tests: No results for input(s): AST, ALT, ALKPHOS, BILITOT, PROT, ALBUMIN in the last 168 hours. No results for input(s): LIPASE, AMYLASE in the last 168 hours. No results for input(s): AMMONIA in the last 168 hours. Coagulation Profile: No results for input(s): INR, PROTIME in the last 168 hours. Cardiac Enzymes:  Recent Labs Lab 08/15/16 0008 08/15/16 0314  TROPONINI 0.12* 0.11*   BNP (last 3 results) No results for input(s): PROBNP in the last 8760 hours. HbA1C: No results for input(s): HGBA1C in the last 72 hours. CBG:  Recent Labs Lab 08/15/16 0326 08/15/16 0458  GLUCAP 38* 112*   Lipid Profile:  Recent Labs  08/15/16 0314  CHOL 95  HDL 35*  LDLCALC 45  TRIG 73  CHOLHDL 2.7   Thyroid Function Tests: No results for input(s): TSH, T4TOTAL, FREET4, T3FREE, THYROIDAB in the last 72 hours. Anemia Panel: No results for input(s): VITAMINB12, FOLATE, FERRITIN, TIBC, IRON, RETICCTPCT in the last 72 hours. Urine analysis:    Component Value Date/Time   COLORURINE YELLOW 07/06/2015 1606   APPEARANCEUR CLOUDY (A) 07/06/2015 1606   LABSPEC 1.019 07/06/2015 1606   PHURINE 5.5 07/06/2015 1606   GLUCOSEU NEGATIVE 07/06/2015 1606   HGBUR LARGE (A) 07/06/2015 1606   BILIRUBINUR NEGATIVE 07/06/2015 1606   KETONESUR NEGATIVE 07/06/2015 1606   PROTEINUR 100 (A) 07/06/2015 1606   UROBILINOGEN 0.2 07/06/2015 1606   NITRITE NEGATIVE 07/06/2015 1606   LEUKOCYTESUR SMALL (A) 07/06/2015 1606   Sepsis Labs: '@LABRCNTIP'$ (procalcitonin:4,lacticidven:4) )No results found for this or any previous visit (from the past 240 hour(s)).   Radiological Exams on  Admission: Dg Chest Port 1 View  Result Date: 08/15/2016 CLINICAL DATA:  Acute onset of shortness of breath. Initial encounter. EXAM: PORTABLE CHEST 1 VIEW COMPARISON:  Chest radiograph performed 05/20/2015 FINDINGS: The lungs are relatively well-aerated. Bilateral central airspace opacification raises concern for multifocal pneumonia, though interstitial edema might have a similar appearance. A small left pleural effusion is suspected. No pneumothorax is seen, though the lung apices are partially obscured by the patient's head. The cardiomediastinal silhouette is borderline normal in size. No acute osseous abnormalities are identified. IMPRESSION: Bilateral central airspace opacification raises concern for multifocal pneumonia, though interstitial edema might have a similar appearance. Small left pleural effusion suspected. Electronically Signed   By: Garald Balding M.D.   On: 08/15/2016 03:07     EKG: Independently reviewed. Sinus rhythm, QTC 535, bifascicular block.   Assessment/Plan Principal Problem:   Acute on chronic respiratory failure with hypoxia (HCC) Active Problems:   Prostate cancer metastatic to multiple sites [facility])   Essential hypertension, benign   Hyperlipidemia   CAD (coronary artery disease)   Elevated troponin   Malnutrition of moderate degree (HCC)   Hypoglycemia   GERD (gastroesophageal reflux disease)   Depression   Type 2 diabetes mellitus without complication (HCC)   Acute on chronic combined systolic and diastolic congestive heart failure (HCC)   Pressure ulcer, stage III ([place])   CAP (community acquired pneumonia)   Acute on chronic respiratory failure with hypoxia ([place]): Etiology is not clear. Patient has elevated BNP and positive JVD, indicating possible mild CHF exacerbation. Chest x-ray showed possible infiltration, he has leukocytosis, indicating possible CAP.   -will admit to tele bed as inpt -will treat CHF exacerbation and possible CAP  as  below  Acute on chronic combined systolic and diastolic congestive heart failure: 2-D echo on 05/23/15 showed EF of 45-50% with grade 1 diastolic dysfunction. Patient presents with elevated BNP 532, and positive JVD, indicating possible CHF exacerbation.  -Lasix 20 mg bid by IV -2d echo -will continue home metoprolol, ASA -start lisinopril 2.5 mg daily -Daily weights -strict I/O's -Low salt diet  Possible CAP: Pt is not septic on admission. Pending lactic acid level - start levaquin  - Mucinex for cough  - prn Atrovent nebs prn for SOB - Urine legionella and S. pneumococcal antigen - Follow up blood culture x2, sputum culture - check lactic acid level  Prostate cancer metastatic to multiple sites [facility]): s/p of surgery 1982, s/p of implant, has indwelling foley catheter, which is monthly changed by urology. -check UA  HTN: -continue amlodipine -Added low-dose lisinopril for CHF  HLD: Last LDL was 37 on 12/30/12 -Continue home medications: Zocor -Check FLP  Chronically elevated trop and CAD: s/p of stent. Baseline troponin 0.2 dL 0.3. History of morning is 0.12 on admission. No chest pain. -Continue aspirin, Zocor, Toprol, when necessary nitroglycerin -Follow-up troponin 3 and 2-D echo  Malnutrition of moderate degree ([place]): -Consultation nutrition  Hypoglycemia: Blood sugar 54, likely due to decreased oral intake and continuation of Lantus use. -CBG every hour -D50 when necessary  DM-II: Last A1c 7.8, not well controled. Patient is taking Lantus at home. Now presents with hypoglycemia. -Hold Lantus -Treat hypoglycemia as above  GERD: -Protonix  Pressure ulcer, stage III ([place]): -consult wound care  Depression: -will hold zoloft due to QTC prolongation    DVT ppx:  SQ Lovenox Code Status: DNR Family Communication: Yes, patient's wife at bed side Disposition Plan:  Anticipate discharge back to previous home environment Consults called:  none Admission status:  Inpatient/tele    Date of Service 08/15/2016    Ivor Costa [facility] Pager [phone]  If 7PM-7AM, please contact night-coverage [email or web address] Password [email or web address] 08/15/2016, 6:07 AM

## 2016-08-16 ENCOUNTER — Telehealth: Payer: Self-pay | Admitting: Cardiovascular Disease

## 2016-08-16 ENCOUNTER — Other Ambulatory Visit (HOSPITAL_COMMUNITY): Payer: Medicare Other

## 2016-08-16 DIAGNOSIS — E440 Moderate protein-calorie malnutrition: Secondary | ICD-10-CM

## 2016-08-16 LAB — BASIC METABOLIC PANEL
Anion gap: 6 (ref 5–15)
BUN: 37 mg/dL — ABNORMAL HIGH (ref 6–20)
CHLORIDE: 100 mmol/L — AB (ref 101–111)
CO2: 29 mmol/L (ref 22–32)
Calcium: 8.8 mg/dL — ABNORMAL LOW (ref 8.9–10.3)
Creatinine, Ser: 1.28 mg/dL — ABNORMAL HIGH (ref 0.61–1.24)
GFR calc non Af Amer: 46 mL/min — ABNORMAL LOW (ref >60)
GFR, EST AFRICAN AMERICAN: 54 mL/min — AB (ref >60)
Glucose, Bld: 232 mg/dL — ABNORMAL HIGH (ref 65–99)
POTASSIUM: 3.9 mmol/L (ref 3.5–5.1)
SODIUM: 135 mmol/L (ref 135–145)

## 2016-08-16 LAB — GLUCOSE, CAPILLARY
GLUCOSE-CAPILLARY: 289 mg/dL — AB (ref 65–99)
GLUCOSE-CAPILLARY: 268 mg/dL — AB (ref 65–99)
GLUCOSE-CAPILLARY: 105 mg/dL — AB (ref 65–99)
Glucose-Capillary: 187 mg/dL — ABNORMAL HIGH (ref 65–99)

## 2016-08-16 LAB — LEGIONELLA PNEUMOPHILA SEROGP 1 UR AG: L. pneumophila Serogp 1 Ur Ag: NEGATIVE

## 2016-08-16 LAB — HEMOGLOBIN A1C
HEMOGLOBIN A1C: 5.5 % (ref 4.8–5.6)
MEAN PLASMA GLUCOSE: 111 mg/dL

## 2016-08-16 MED ORDER — DOXYCYCLINE HYCLATE 100 MG PO TABS
100.0000 mg | ORAL_TABLET | Freq: Two times a day (BID) | ORAL | Status: DC
  Administered 2016-08-16 – 2016-08-18 (×5): 100 mg via ORAL
  Filled 2016-08-16 (×5): qty 1

## 2016-08-16 MED ORDER — BENEPROTEIN PO POWD
1.0000 | Freq: Three times a day (TID) | ORAL | Status: DC
  Administered 2016-08-16 – 2016-08-18 (×4): 6 g via ORAL
  Filled 2016-08-16 (×2): qty 227

## 2016-08-16 MED ORDER — INSULIN ASPART 100 UNIT/ML ~~LOC~~ SOLN
0.0000 [IU] | Freq: Three times a day (TID) | SUBCUTANEOUS | Status: DC
  Administered 2016-08-16: 5 [IU] via SUBCUTANEOUS
  Administered 2016-08-17: 1 [IU] via SUBCUTANEOUS
  Administered 2016-08-17: 2 [IU] via SUBCUTANEOUS
  Administered 2016-08-18: 3 [IU] via SUBCUTANEOUS

## 2016-08-16 MED ORDER — AMLODIPINE BESYLATE 5 MG PO TABS
5.0000 mg | ORAL_TABLET | Freq: Every day | ORAL | Status: DC
  Filled 2016-08-16: qty 1

## 2016-08-16 MED ORDER — CYCLOSPORINE 0.05 % OP EMUL
1.0000 [drp] | Freq: Two times a day (BID) | OPHTHALMIC | Status: DC
  Administered 2016-08-16 – 2016-08-18 (×4): 1 [drp] via OPHTHALMIC
  Filled 2016-08-16 (×5): qty 1

## 2016-08-16 MED ORDER — GLUCERNA SHAKE PO LIQD
237.0000 mL | Freq: Three times a day (TID) | ORAL | Status: DC
  Administered 2016-08-16 – 2016-08-18 (×4): 237 mL via ORAL

## 2016-08-16 MED ORDER — INSULIN GLARGINE 100 UNIT/ML ~~LOC~~ SOLN
8.0000 [IU] | Freq: Every day | SUBCUTANEOUS | Status: DC
  Administered 2016-08-16 – 2016-08-17 (×2): 8 [IU] via SUBCUTANEOUS
  Filled 2016-08-16 (×3): qty 0.08

## 2016-08-16 MED ORDER — PRO-STAT SUGAR FREE PO LIQD
30.0000 mL | Freq: Every day | ORAL | Status: DC
  Administered 2016-08-17 – 2016-08-18 (×2): 30 mL via ORAL
  Filled 2016-08-16 (×2): qty 30

## 2016-08-16 MED ORDER — INFLUENZA VAC SPLIT QUAD 0.5 ML IM SUSY
0.5000 mL | PREFILLED_SYRINGE | INTRAMUSCULAR | Status: AC
  Administered 2016-08-17: 0.5 mL via INTRAMUSCULAR
  Filled 2016-08-16: qty 0.5

## 2016-08-16 NOTE — Progress Notes (Signed)
PROGRESS NOTE    Guy [name]  HCW:[id number] DOB: 27-Dec-1919 DOA: 08/14/2016 PCP: [name], MD    Brief Narrative:  Guy [name] is a 82 y.o. male with medical history significant of chronic combined systolic and diastolic CHF with EF of 17-61%, hypertension, hyperlipidemia, diabetes mellitus, COPD on 3L oxygen at night, GERD, depression, stroke, metastasized prostate cancer (s/p of surgery 1982, s/p of implant), indwelling foley catheter, CAD, s/p of stent placement, chronically elevated troponin (at baseline 0.2-0.3), who presents with a shortness of breath.  Patient has been having shortness stress in the past several days, which has been progressively getting worse. He has dry cough, but no chest pain. No fever or chills. Patient denies nausea, vomiting, abdominal pain, diarrhea, symptoms of UTI. No tenderness over calf areas.  Pt was found to have BNP 532.2, troponin 0.12, WBC 11.6, electrolytes renal function okay, temperature normal, no tachycardia, oxygen saturation 99% on room air. Hypoglycemia with blood sugar 54. CXR showed bilateral central airspace opacification raises concern for multifocal pneumonia, though interstitial edema might have a similar appearance. Pt is admitted to tele bed as inpt.   Assessment & Plan:   Principal Problem:   Acute on chronic respiratory failure with hypoxia (HCC) Active Problems:   Prostate cancer metastatic to multiple sites [facility])   Essential hypertension, benign   Hyperlipidemia   CAD (coronary artery disease)   Elevated troponin   Malnutrition of moderate degree (HCC)   Hypoglycemia   GERD (gastroesophageal reflux disease)   Depression   Type 2 diabetes mellitus without complication (HCC)   Acute on chronic combined systolic and diastolic congestive heart failure (HCC)   Pressure ulcer, stage III ([place])   CAP (community acquired pneumonia)   Acute on chronic respiratory failure with hypoxia ([place]):  -Etiology  unclear - CXR shows bilateral infiltrates - treating with IV lasix (was on '20mg'$  IV BID yesterday with a slight increase in creatinine this am) - will receive 1 dose of lasix today and then transition to PO - 2d echo ordered and pending - patient reports feeling better -will treat CHF exacerbation and possible CAP as below  Acute on chronic combined systolic and diastolic congestive heart failure: 2-D echo on 05/23/15 showed EF of 45-50% with grade 1 diastolic dysfunction. Patient presents with elevated BNP 532, and positive JVD, indicating possible CHF exacerbation.  - Lasix 20 mg bid by IV yesterday transitioned to once today - 2d echo pending - will continue home metoprolol, ASA - start lisinopril 2.5 mg daily - Daily weights - strict I/O's (-2022m yesterday) - Low salt diet - Dr. BAlvester Chounotified  Possible CAP: Pt is not septic on admission. Pending lactic acid level - levaquin started yesterday - will transition to doxycycline today - Mucinex for cough  - prn Atrovent nebs prn for SOB - Urine legionella pending - S. pneumococcal antigen negative - Blood culture and sputum culture pending - lactic acid level WNL  Prostate cancer metastatic to multiple sites ([facility]: s/p of surgery 1982, s/p of implant, has indwelling foley catheter, which is monthly changed by urology. -check UA - urine culture ordered  HTN: -continue amlodipine -Added low-dose lisinopril for CHF - will halve amlodipine for lower BP  HLD: Last LDL was 37 on 12/30/12 -Continue home medications: Zocor - Question of whether patient needs to even continue statin at his age  Chronically elevated trop and CAD:  - s/p of stent - Baseline troponin 0.2 dL 0.3 - No chest pain. -Continue aspirin,  Zocor, Toprol, when necessary nitroglycerin - Troponin stable - Echocardiogram pending - poor candidate for any invasive procedure  Malnutrition of moderate degree ([place]): -Consultation nutrition  Hypoglycemia:   - resolved  DM-II:  - HgA1c of 5.5 - can restart Lantus at lower dose than home dose - Can start Lantus 8 units today - sensitive insulin slide  GERD: -Protonix  Pressure ulcer, stage III ([place]): -consult wound care  Depression: -will hold zoloft due to QTC prolongation    DVT ppx:  SQ Lovenox Code Status: DNR Family Communication: Yes, patient's wife at bed side Disposition Plan:  Anticipate discharge back to previous home environment in 48 to 72 hours pending improvement in respiratory status   Consultants:   Wound Care  PT/OT  Procedures:   none  Antimicrobials:   Levaquin 11/16>11/17  Doxycycline 11/17    Subjective: Patient seen and evaluated.  Reports that he feels much better today.  He says his breathing is better.  Tells me his wife made him an appointment with his doctor on Tuesday.  Wife is asking for some home medications to be restarted- have restarted restasis and will restart latanoprost.    Objective: Vitals:   08/16/16 0553 08/16/16 0822 08/16/16 1106 08/16/16 1223  BP: (!) 104/49 (!) 124/58  (!) 120/54  Pulse: 63 (!) 59  64  Resp: '16 18  18  '$ Temp: 97.5 F (36.4 C)   98.3 F (36.8 C)  TempSrc: Oral   Oral  SpO2: 98% 100%  100%  Weight: 46.3 kg (102 lb 1.6 oz)     Height:   '5\' 6"'$  (1.676 m)     Intake/Output Summary (Last 24 hours) at 08/16/16 1333 Last data filed at 08/16/16 1019  Gross per 24 hour  Intake              652 ml  Output             1276 ml  Net             -624 ml   Filed Weights   08/16/16 0553  Weight: 46.3 kg (102 lb 1.6 oz)    Examination:  General exam: Extremely thin, fragile, cachectic elderly male, appears calm and comfortable Respiratory system: Rhonchi in upper lung fields bilaterally. Respiratory effort normal. Cardiovascular system: S1 & S2 heard, RRR. No JVD, murmurs, rubs, gallops or clicks. No pedal edema. Gastrointestinal system: Abdomen is nondistended, soft and nontender. No  organomegaly or masses felt. Normal bowel sounds heard. Central nervous system: Alert and oriented to person, place, situation. No focal neurological deficits. Extremities: Symmetric 5 x 5 power. Skin: Pressure ulcer over most of back, numerous ecchymoses on arms and legs bilaterally Psychiatry: Judgement and insight appear normal. Mood & affect appropriate.     Data Reviewed: I have personally reviewed following labs and imaging studies  CBC:  Recent Labs Lab 08/15/16 0008  WBC 11.6*  NEUTROABS 9.1*  HGB 10.5*  HCT 31.3*  MCV 98.7  PLT 630*   Basic Metabolic Panel:  Recent Labs Lab 08/15/16 0008 08/16/16 0331  NA 137 135  K 3.7 3.9  CL 105 100*  CO2 26 29  GLUCOSE 54* 232*  BUN 38* 37*  CREATININE 1.07 1.28*  CALCIUM 8.8* 8.8*   GFR: Estimated Creatinine Clearance: 23.6 mL/min (by C-G formula based on SCr of 1.28 mg/dL (H)). Liver Function Tests: No results for input(s): AST, ALT, ALKPHOS, BILITOT, PROT, ALBUMIN in the last 168 hours. No results for input(s): LIPASE,  AMYLASE in the last 168 hours. No results for input(s): AMMONIA in the last 168 hours. Coagulation Profile: No results for input(s): INR, PROTIME in the last 168 hours. Cardiac Enzymes:  Recent Labs Lab 08/15/16 0008 08/15/16 0314 08/15/16 0718 08/15/16 1631  TROPONINI 0.12* 0.11* 0.12* 0.12*   BNP (last 3 results) No results for input(s): PROBNP in the last 8760 hours. HbA1C:  Recent Labs  08/15/16 0314  HGBA1C 5.5   CBG:  Recent Labs Lab 08/15/16 1156 08/15/16 1546 08/15/16 2223 08/16/16 0628 08/16/16 1147  GLUCAP 133* 128* 147* 187* 289*   Lipid Profile:  Recent Labs  08/15/16 0314  CHOL 95  HDL 35*  LDLCALC 45  TRIG 73  CHOLHDL 2.7   Thyroid Function Tests: No results for input(s): TSH, T4TOTAL, FREET4, T3FREE, THYROIDAB in the last 72 hours. Anemia Panel: No results for input(s): VITAMINB12, FOLATE, FERRITIN, TIBC, IRON, RETICCTPCT in the last 72  hours. Sepsis Labs:  Recent Labs Lab 08/15/16 0718  LATICACIDVEN 0.8    No results found for this or any previous visit (from the past 240 hour(s)).       Radiology Studies: Dg Chest Port 1 View  Result Date: 08/15/2016 CLINICAL DATA:  Acute onset of shortness of breath. Initial encounter. EXAM: PORTABLE CHEST 1 VIEW COMPARISON:  Chest radiograph performed 05/20/2015 FINDINGS: The lungs are relatively well-aerated. Bilateral central airspace opacification raises concern for multifocal pneumonia, though interstitial edema might have a similar appearance. A small left pleural effusion is suspected. No pneumothorax is seen, though the lung apices are partially obscured by the patient's head. The cardiomediastinal silhouette is borderline normal in size. No acute osseous abnormalities are identified. IMPRESSION: Bilateral central airspace opacification raises concern for multifocal pneumonia, though interstitial edema might have a similar appearance. Small left pleural effusion suspected. Electronically Signed   By: Garald Balding M.D.   On: 08/15/2016 03:07        Scheduled Meds: . amLODipine  10 mg Oral Daily  . aspirin EC  81 mg Oral Daily  . bicalutamide  50 mg Oral Daily  . calcium-vitamin D  1 tablet Oral BID WC  . collagenase   Topical Daily  . cycloSPORINE  1 drop Both Eyes BID  . enoxaparin (LOVENOX) injection  30 mg Subcutaneous Daily  . feeding supplement (GLUCERNA SHAKE)  237 mL Oral TID BM  . feeding supplement (PRO-STAT SUGAR FREE 64)  30 mL Oral Daily  . furosemide  20 mg Intravenous BID  . gabapentin  100 mg Oral TID  . [START ON 08/17/2016] Influenza vac split quadrivalent PF  0.5 mL Intramuscular Tomorrow-1000  . [START ON 08/17/2016] levofloxacin  500 mg Oral Q48H  . lipase/protease/amylase  12,000 Units Oral TID WC  . lisinopril  2.5 mg Oral Daily  . metoprolol succinate  12.5 mg Oral Daily  . multivitamin with minerals  1 tablet Oral Daily  . pantoprazole   40 mg Oral Daily  . protein supplement  1 scoop Oral TID WC  . senna-docusate  1 tablet Oral QHS  . simvastatin  10 mg Oral QODAY  . sodium chloride flush  3 mL Intravenous Q12H   Continuous Infusions:   LOS: 1 day    Time spent: 35 minutes    Loretha Stapler, MD [facility] Pager [phone]  If 7PM-7AM, please contact night-coverage [email or web address] Password [email or web address] 08/16/2016, 1:33 PM

## 2016-08-16 NOTE — Progress Notes (Signed)
Wife of pt requests that pt's cardiologist, Dr. Gwenlyn Found, be notified that he is in hospital.  Dr. Adair Patter has already notified Dr. Gwenlyn Found.

## 2016-08-16 NOTE — Evaluation (Signed)
Occupational Therapy Evaluation and Discharge Patient Details Name: Guy [name] MRN: [id number] DOB: 07-05-1920 Today's Date: 08/16/2016    History of Present Illness pt presents with Acute on Chronic Respiratory Failure.  pt with hx of CHF, HTN, DM, COPD, Depression, CVA, Prostate CA with Mets, Bladder CA, Indwelling Catheter, CAD, Hep A, L Heel Ulcer, and Vertigo.     Clinical Impression   Pt is assisted for all mobility by private duty aides. He can self feed with supervision, but is otherwise dependent in all ADL. Per wife, pt was receiving weekly PT at home with the focus on teaching the aides how to do exercises and walk with pt. Wife plans to continue caring for pt at home. She does not want any equipment, including a hospital bed or hoyer lift. No further OT needs.    Follow Up Recommendations  No OT follow up;Supervision/Assistance - 24 hour    Equipment Recommendations  None recommended by OT    Recommendations for Other Services       Precautions / Restrictions Precautions Precautions: Fall Precaution Comments: pt is very HOH. Restrictions Weight Bearing Restrictions: No      Mobility Bed Mobility Overal bed mobility: Needs Assistance;+2 for physical assistance Bed Mobility: Supine to Sit;Sit to Supine     Supine to sit: Max assist Sit to supine: Max assist;+2 for physical assistance      Transfers                      Balance     Sitting balance-Leahy Scale: Poor                                      ADL Overall ADL's : At baseline                                             Vision     Perception     Praxis      Pertinent Vitals/Pain Pain Assessment: No/denies pain     Hand Dominance Right   Extremity/Trunk Assessment Upper Extremity Assessment Upper Extremity Assessment: Generalized weakness   Lower Extremity Assessment Lower Extremity Assessment: Defer to PT evaluation   Cervical  / Trunk Assessment Cervical / Trunk Assessment: Kyphotic   Communication Communication Communication: HOH   Cognition Arousal/Alertness: Awake/alert Behavior During Therapy: WFL for tasks assessed/performed Overall Cognitive Status: Difficult to assess (per wife, pt does not have any cognitive deficits)       Memory: Decreased short-term memory             General Comments       Exercises       Shoulder Instructions      Home Living Family/patient expects to be discharged to:: Private residence Living Arrangements: Spouse/significant other Available Help at Discharge: Family;Personal care attendant;Available 24 hours/day Type of Home: House Home Access: Ramped entrance     Home Layout: One level     Bathroom Shower/Tub: Occupational psychologist: Standard     Home Equipment: Environmental consultant - 2 wheels;Bedside commode;Shower seat;Hand held shower head (transport chair, bed with elevating head and feet)   Additional Comments: aides do all bathing and dressing, toileting and help walk and exercise pt      Prior Functioning/Environment  Level of Independence: Needs assistance  Gait / Transfers Assistance Needed: walks with hands held with aide, not as a means of mobility ADL's / Homemaking Assistance Needed: aides put him in the shower, self feeds with supervision Communication / Swallowing Assistance Needed: HOH with aides Comments: PLOF and home set up information gathered from wife.        OT Problem List: Decreased strength;Decreased activity tolerance;Impaired balance (sitting and/or standing);Decreased knowledge of use of DME or AE   OT Treatment/Interventions:      OT Goals(Current goals can be found in the care plan section) Acute Rehab OT Goals Patient Stated Goal: return home (wife)  OT Frequency:     Barriers to D/C:            Co-evaluation              End of Session Equipment Utilized During Treatment: Oxygen  Activity Tolerance:  Patient tolerated treatment well Patient left: in bed;with call bell/phone within reach;with family/visitor present   Time: 8138-8719 OT Time Calculation (min): 37 min Charges:  OT General Charges $OT Visit: 1 Procedure OT Evaluation $OT Eval Moderate Complexity: 1 Procedure OT Treatments $Therapeutic Activity: 8-22 mins G-Codes:    Malka So 08/16/2016, 2:38 PM  [phone]

## 2016-08-16 NOTE — Telephone Encounter (Signed)
Mrs Audree Camel wanted to be sure to let Dr Gwenlyn Found know that Emily is in the hospital for his heart.

## 2016-08-16 NOTE — Progress Notes (Signed)
Wife of pt states that pt needs orders for the following: Latanoprost 0.05% OPH solution eye gtts Minocycline '100mg'$  capsules PO BiD Restatis/Refresh eye gtts. MD has been notified.

## 2016-08-16 NOTE — Progress Notes (Signed)
Initial Nutrition Assessment  DOCUMENTATION CODES:   Severe malnutrition in context of chronic illness, Underweight  INTERVENTION:  Provide Glucerna Shake po TID, each supplement provides 220 kcal and 10 grams of protein (if glucose is stable, change supplement to Ensure Enlive BID) Provide one packet Beneprotein TID with meals, each packet provides 6 grams of protein and 25 kcal Provide 30 ml Pro-stat once daily, provides 100 kcal and 15 grams of protein Multivitamin with minerals daily  Recommend liberalizing diet to Low Sodium  NUTRITION DIAGNOSIS:   Malnutrition related to chronic illness as evidenced by severe depletion of body fat, severe depletion of muscle mass.   GOAL:   Patient will meet greater than or equal to 90% of their needs   MONITOR:   PO intake, Supplement acceptance, Labs, Weight trends, Skin, I & O's  REASON FOR ASSESSMENT:   Consult Assessment of nutrition requirement/status  ASSESSMENT:   82 y.o. male with medical history significant of chronic combined systolic and diastolic CHF with EF of 36-64%, hypertension, hyperlipidemia, diabetes mellitus, COPD on 3L oxygen at night, GERD, depression, stroke, metastasized prostate cancer (s/p of surgery 1982, s/p of implant), indwelling foley catheter, CAD, s/p of stent placement, chronically elevated troponin (at baseline 0.2-0.3), who presents with a shortness of breath.  Pt very hard of hearing and speech also difficult to understand. He reports liking Glucerna Shakes and states that he could drink one daily. He reports eating eggs and toast for breakfast. He is very thin with severe muscle wasting and severe fat wasting noted in nutrition-focused physical exam. Empty carton of Glucerna at bedside at time of visit.   Labs: glucose ranging 54 to 232 mg/dL, elevated BUN, low hemoglobin  Diet Order:  Diet renal with fluid restriction Fluid restriction: 1500 mL Fluid; Room service appropriate? Yes; Fluid  consistency: Thin  Skin:  Wound (see comment) (stage II pressure ulcers on vertebral column and on sacrum)  Last BM:  11/16  Height:   Ht Readings from Last 1 Encounters:  08/16/16 '5\' 6"'$  (1.676 m)    Weight:   Wt Readings from Last 1 Encounters:  08/16/16 102 lb 1.6 oz (46.3 kg)    Ideal Body Weight:  64.5 kg  BMI:  Body mass index is 16.48 kg/m.  Estimated Nutritional Needs:   Kcal:  1400-1600  Protein:  65-75 grams  Fluid:  1.5 L restriction per MD  EDUCATION NEEDS:   No education needs identified at this time  [place], CSP, LDN Inpatient Clinical Dietitian Pager: [phone] After Hours Pager: [phone]

## 2016-08-17 DIAGNOSIS — C61 Malignant neoplasm of prostate: Secondary | ICD-10-CM

## 2016-08-17 DIAGNOSIS — J189 Pneumonia, unspecified organism: Secondary | ICD-10-CM

## 2016-08-17 LAB — GLUCOSE, CAPILLARY
GLUCOSE-CAPILLARY: 147 mg/dL — AB (ref 65–99)
Glucose-Capillary: 94 mg/dL (ref 65–99)
Glucose-Capillary: 184 mg/dL — ABNORMAL HIGH (ref 65–99)
Glucose-Capillary: 118 mg/dL — ABNORMAL HIGH (ref 65–99)

## 2016-08-17 LAB — BASIC METABOLIC PANEL
Anion gap: 8 (ref 5–15)
BUN: 46 mg/dL — ABNORMAL HIGH (ref 6–20)
CO2: 30 mmol/L (ref 22–32)
CREATININE: 1.37 mg/dL — AB (ref 0.61–1.24)
Calcium: 8.9 mg/dL (ref 8.9–10.3)
Chloride: 101 mmol/L (ref 101–111)
GFR calc non Af Amer: 43 mL/min — ABNORMAL LOW (ref >60)
GFR, EST AFRICAN AMERICAN: 50 mL/min — AB (ref >60)
GLUCOSE: 101 mg/dL — AB (ref 65–99)
Potassium: 3.4 mmol/L — ABNORMAL LOW (ref 3.5–5.1)
Sodium: 139 mmol/L (ref 135–145)

## 2016-08-17 MED ORDER — AMLODIPINE BESYLATE 2.5 MG PO TABS
2.5000 mg | ORAL_TABLET | Freq: Every day | ORAL | Status: DC
  Administered 2016-08-18: 2.5 mg via ORAL
  Filled 2016-08-17: qty 1

## 2016-08-17 MED ORDER — ORAL CARE MOUTH RINSE
15.0000 mL | Freq: Two times a day (BID) | OROMUCOSAL | Status: DC
  Administered 2016-08-17 – 2016-08-18 (×3): 15 mL via OROMUCOSAL

## 2016-08-17 MED ORDER — LATANOPROST 0.005 % OP SOLN
1.0000 [drp] | Freq: Every day | OPHTHALMIC | Status: DC
  Administered 2016-08-17: 1 [drp] via OPHTHALMIC
  Filled 2016-08-17: qty 2.5

## 2016-08-17 NOTE — Progress Notes (Signed)
Patient with no complaints of pain or shortness of breath on 7 a-7p shift.  Patient with several small amounts of pudding consistency stool, rectum appears to have moisture associated skin damage.  Barrier cream applied.  Patient will drink Ensure and juice with protein powder mixed.  Patient able to feed himself supper.

## 2016-08-17 NOTE — Progress Notes (Signed)
Received a call from the micro lab. The add-on urine culture order submitted yesterday 08-16-16 cannot be processed because the urine specimen collected in the ED was sent to [facility]. Need to resubmit the urine culture order and send a urine specimen.

## 2016-08-17 NOTE — Progress Notes (Signed)
PROGRESS NOTE    Guy [name]  DXI:[id number] DOB: [birth year] DOA: 08/14/2016 PCP: [name], MD    Brief Narrative:  Guy [name] is a 82 y.o. male with medical history significant of chronic combined systolic and diastolic CHF with EF of 76-73%, hypertension, hyperlipidemia, diabetes mellitus, COPD on 3L oxygen at night, GERD, depression, stroke, metastasized prostate cancer (s/p of surgery 1982, s/p of implant), indwelling foley catheter, CAD, s/p of stent placement, chronically elevated troponin (at baseline 0.2-0.3), who presents with a shortness of breath.  Patient has been having shortness stress in the past several days, which has been progressively getting worse. He has dry cough, but no chest pain. No fever or chills. Patient denies nausea, vomiting, abdominal pain, diarrhea, symptoms of UTI. No tenderness over calf areas.  Pt was found to have BNP 532.2, troponin 0.12, WBC 11.6, electrolytes renal function okay, temperature normal, no tachycardia, oxygen saturation 99% on room air. Hypoglycemia with blood sugar 54. CXR showed bilateral central airspace opacification raises concern for multifocal pneumonia, though interstitial edema might have a similar appearance. Pt is admitted to tele bed as inpt.   Assessment & Plan:   Principal Problem:   Acute on chronic respiratory failure with hypoxia (HCC) Active Problems:   Prostate cancer metastatic to multiple sites [facility])   Essential hypertension, benign   Hyperlipidemia   CAD (coronary artery disease)   Elevated troponin   Malnutrition of moderate degree (HCC)   Hypoglycemia   GERD (gastroesophageal reflux disease)   Depression   Type 2 diabetes mellitus without complication (HCC)   Acute on chronic combined systolic and diastolic congestive heart failure (HCC)   Pressure ulcer, stage III ([place])   CAP (community acquired pneumonia)   Acute on chronic respiratory failure with hypoxia ([place]):  -Etiology  unclear - CXR shows bilateral infiltrates - treating with IV lasix (was on '20mg'$  IV BID yesterday with a slight increase in creatinine this am) - creatinine increased this am so lasix held - 2d echo ordered and pending - patient reports feeling better and is off oxygen now -will treat CHF exacerbation and possible CAP as below - patient appears very frail and ill  Acute on chronic combined systolic and diastolic congestive heart failure: 2-D echo on 05/23/15 showed EF of 45-50% with grade 1 diastolic dysfunction. Patient presents with elevated BNP 532, and positive JVD, indicating possible CHF exacerbation.  - Lasix 20 mg bid by IV yesterday transitioned to once yesterday - 2d echo still pending - will continue home metoprolol, ASA - start lisinopril 2.5 mg daily - Daily weights - strict I/O's (+176m yesterday) - wife is requesting regular diet - Dr. BAlvester Chounotified  Possible CAP: Pt is not septic on admission. Pending lactic acid level - levaquin started yesterday - will transition to doxycycline today - Mucinex for cough  - prn Atrovent nebs prn for SOB - Urine legionella negative - S. pneumococcal antigen negative - Blood culture no growth x 2 days - sputum culture pending - lactic acid level WNL  Prostate cancer metastatic to multiple sites ([facility]: s/p of surgery 1982, s/p of implant, has indwelling foley catheter, which is monthly changed by urology. - chronic foley - urine culture ordered  HTN: -continue amlodipine but at 2.'5mg'$  - will hold lisinopril due to elevated creatinine  HLD: Last LDL was 37 on 12/30/12 -Continue home medications: Zocor - Question of whether patient needs to even continue statin at his age  Chronically elevated trop and CAD:  -  s/p of stent - Baseline troponin 0.2 dL 0.3 - No chest pain. -Continue aspirin, Zocor, Toprol, when necessary nitroglycerin - Troponin stable - Echocardiogram pending - poor candidate for any invasive  procedure  Malnutrition of severe degree ([place]): -Consultation nutrition   Hypoglycemia:  - resolved  DM-II:  - HgA1c of 5.5 - can restart Lantus at lower dose than home dose - Can start Lantus 8 units today - sensitive insulin slide  GERD: -Protonix  Pressure ulcer, stage III ([place]): -consult wound care  Depression: -will hold zoloft due to QTC prolongation    DVT ppx:  SQ Lovenox Code Status: DNR Family Communication: Yes, patient's wife at bed side Disposition Plan:  Anticipate discharge back to previous home environment in 48 to 72 hours pending improvement in respiratory status   Consultants:   Wound Care  PT/OT  Procedures:   none  Antimicrobials:   Levaquin 11/16>11/17  Doxycycline 11/17    Subjective: Patient seen and evaluated. Attempted to discuss with patient and his wife that patient has many medical problems and that goals of care discussion may be appropriate- not necessarily for this hospitalization but in general.  Poor insight by both as to patient's many medical comorbidities.  Wife repeatedly states she wants patient to be discharged when he is healthy and wants as much done as possible to make him healthy.  States patient does not walk at home but is often lifted by home health aide who is there daily.    Objective: Vitals:   08/17/16 0654 08/17/16 0946 08/17/16 1007 08/17/16 1200  BP: (!) 107/55 118/61  (!) 125/56  Pulse: (!) 55 60  (!) 58  Resp: 18   18  Temp: 97.4 F (36.3 C)   97.5 F (36.4 C)  TempSrc: Oral   Oral  SpO2: 100% 100% 99% 97%  Weight: 45 kg (99 lb 1.6 oz)     Height:        Intake/Output Summary (Last 24 hours) at 08/17/16 1440 Last data filed at 08/17/16 1007  Gross per 24 hour  Intake              717 ml  Output              600 ml  Net              117 ml   Filed Weights   08/16/16 0553 08/17/16 0654  Weight: 46.3 kg (102 lb 1.6 oz) 45 kg (99 lb 1.6 oz)    Examination:  General exam:  Extremely thin, fragile, cachectic elderly male, appears calm and comfortable Respiratory system: clear to auscultation. Respiratory effort normal. Cardiovascular system: S1 & S2 heard, RRR. No JVD, murmurs, rubs, gallops or clicks. No pedal edema. Gastrointestinal system: Abdomen is nondistended, soft and nontender. No organomegaly or masses felt. Normal bowel sounds heard. Central nervous system: Alert and oriented to person, place. No focal neurological deficits. Extremities: Symmetric 4 x 5 power. Skin: Pressure ulcer over most of back, numerous ecchymoses on arms and legs bilaterally Psychiatry: Judgement appears normal; questionable insight into medical comorbidities. Mood & affect appropriate.     Data Reviewed: I have personally reviewed following labs and imaging studies  CBC:  Recent Labs Lab 08/15/16 0008  WBC 11.6*  NEUTROABS 9.1*  HGB 10.5*  HCT 31.3*  MCV 98.7  PLT 350*   Basic Metabolic Panel:  Recent Labs Lab 08/15/16 0008 08/16/16 0331 08/17/16 0541  NA 137 135 139  K 3.7 3.9  3.4*  CL 105 100* 101  CO2 '26 29 30  '$ GLUCOSE 54* 232* 101*  BUN 38* 37* 46*  CREATININE 1.07 1.28* 1.37*  CALCIUM 8.8* 8.8* 8.9   GFR: Estimated Creatinine Clearance: 21.4 mL/min (by C-G formula based on SCr of 1.37 mg/dL (H)). Liver Function Tests: No results for input(s): AST, ALT, ALKPHOS, BILITOT, PROT, ALBUMIN in the last 168 hours. No results for input(s): LIPASE, AMYLASE in the last 168 hours. No results for input(s): AMMONIA in the last 168 hours. Coagulation Profile: No results for input(s): INR, PROTIME in the last 168 hours. Cardiac Enzymes:  Recent Labs Lab 08/15/16 0008 08/15/16 0314 08/15/16 0718 08/15/16 1631  TROPONINI 0.12* 0.11* 0.12* 0.12*   BNP (last 3 results) No results for input(s): PROBNP in the last 8760 hours. HbA1C:  Recent Labs  08/15/16 0314  HGBA1C 5.5   CBG:  Recent Labs Lab 08/16/16 1147 08/16/16 1619 08/16/16 2232  08/17/16 0652 08/17/16 1135  GLUCAP 289* 268* 105* 94 147*   Lipid Profile:  Recent Labs  08/15/16 0314  CHOL 95  HDL 35*  LDLCALC 45  TRIG 73  CHOLHDL 2.7   Thyroid Function Tests: No results for input(s): TSH, T4TOTAL, FREET4, T3FREE, THYROIDAB in the last 72 hours. Anemia Panel: No results for input(s): VITAMINB12, FOLATE, FERRITIN, TIBC, IRON, RETICCTPCT in the last 72 hours. Sepsis Labs:  Recent Labs Lab 08/15/16 6644  LATICACIDVEN 0.8    Recent Results (from the past 240 hour(s))  Culture, blood (routine x 2) Call MD if unable to obtain prior to antibiotics being given     Status: None (Preliminary result)   Collection Time: 08/15/16  7:19 AM  Result Value Ref Range Status   Specimen Description BLOOD RIGHT ANTECUBITAL  Final   Special Requests   Final    BOTTLES DRAWN AEROBIC AND ANAEROBIC 10CC AER 5CC ANA   Culture NO GROWTH 2 DAYS  Final   Report Status PENDING  Incomplete  Culture, blood (routine x 2) Call MD if unable to obtain prior to antibiotics being given     Status: None (Preliminary result)   Collection Time: 08/15/16  7:28 AM  Result Value Ref Range Status   Specimen Description BLOOD RIGHT HAND  Final   Special Requests BOTTLES DRAWN AEROBIC ONLY 5CC  Final   Culture NO GROWTH 2 DAYS  Final   Report Status PENDING  Incomplete         Radiology Studies: No results found.      Scheduled Meds: . amLODipine  5 mg Oral Daily  . aspirin EC  81 mg Oral Daily  . bicalutamide  50 mg Oral Daily  . calcium-vitamin D  1 tablet Oral BID WC  . collagenase   Topical Daily  . cycloSPORINE  1 drop Both Eyes BID  . doxycycline  100 mg Oral Q12H  . enoxaparin (LOVENOX) injection  30 mg Subcutaneous Daily  . feeding supplement (GLUCERNA SHAKE)  237 mL Oral TID BM  . feeding supplement (PRO-STAT SUGAR FREE 64)  30 mL Oral Daily  . gabapentin  100 mg Oral TID  . insulin aspart  0-9 Units Subcutaneous TID WC  . insulin glargine  8 Units Subcutaneous  QHS  . latanoprost  1 drop Right Eye QHS  . lipase/protease/amylase  12,000 Units Oral TID WC  . lisinopril  2.5 mg Oral Daily  . mouth rinse  15 mL Mouth Rinse BID  . metoprolol succinate  12.5 mg Oral Daily  .  multivitamin with minerals  1 tablet Oral Daily  . pantoprazole  40 mg Oral Daily  . protein supplement  1 scoop Oral TID WC  . senna-docusate  1 tablet Oral QHS  . simvastatin  10 mg Oral QODAY  . sodium chloride flush  3 mL Intravenous Q12H   Continuous Infusions:   LOS: 2 days    Time spent: 35 minutes    Loretha Stapler, MD [facility] Pager [phone]  If 7PM-7AM, please contact night-coverage [email or web address] Password [email or web address] 08/17/2016, 2:40 PM

## 2016-08-17 NOTE — Plan of Care (Signed)
Problem: Skin Integrity: Goal: Risk for impaired skin integrity will decrease Outcome: Progressing Patient came to hospital with skin breakdown, is on air mattress with wound care orders for mid thoracic spine

## 2016-08-18 ENCOUNTER — Inpatient Hospital Stay (HOSPITAL_COMMUNITY): Payer: Medicare Other

## 2016-08-18 DIAGNOSIS — I509 Heart failure, unspecified: Secondary | ICD-10-CM

## 2016-08-18 LAB — ECHOCARDIOGRAM COMPLETE
AO mean calculated velocity dopler: 136 cm/s
AOPV: 0.45 m/s
AV Area VTI index: 1.21 cm2/m2
AV Area VTI: 1.56 cm2
AV Mean grad: 9 mmHg
AV Peak grad: 19 mmHg
AV peak Index: 1.05
AV pk vel: 218 cm/s
AVA: 1.79 cm2
AVAREAMEANV: 1.41 cm2
AVAREAMEANVIN: 0.95 cm2/m2
AVCELMEANRAT: 0.41
CHL CUP AV VEL: 1.79
CHL CUP DOP CALC LVOT VTI: 22.5 cm
CHL CUP RV SYS PRESS: 35 mmHg
CHL CUP TV REG PEAK VELOCITY: 284 cm/s
E decel time: 384 msec
E/e' ratio: 31.97
FS: 21 % — AB (ref 28–44)
HEIGHTINCHES: 66 in
IVS/LV PW RATIO, ED: 1.58
LA vol A4C: 46.4 ml
LA vol: 69 mL
LADIAMINDEX: 1.76 cm/m2
LASIZE: 26 mm
LAVOLIN: 46.6 mL/m2
LDCA: 3.46 cm2
LEFT ATRIUM END SYS DIAM: 26 mm
LV E/e' medial: 31.97
LV E/e'average: 31.97
LV e' LATERAL: 2.64 cm/s
LVOT SV: 78 mL
LVOT diameter: 21 mm
LVOT peak vel: 98.5 cm/s
LVOTVTI: 0.52 cm
MV Dec: 384
MV pk A vel: 111 m/s
MVPG: 3 mmHg
MVPKEVEL: 84.4 m/s
P 1/2 time: 773 ms
PW: 14.6 mm — AB (ref 0.6–1.1)
RV LATERAL S' VELOCITY: 8.72 cm/s
TDI e' lateral: 2.64
TDI e' medial: 1.91
TR max vel: 284 cm/s
VTI: 43.4 cm
Valve area index: 1.21
WEIGHTICAEL: 1574.4 [oz_av]

## 2016-08-18 LAB — GLUCOSE, CAPILLARY
Glucose-Capillary: 106 mg/dL — ABNORMAL HIGH (ref 65–99)
Glucose-Capillary: 223 mg/dL — ABNORMAL HIGH (ref 65–99)

## 2016-08-18 LAB — BASIC METABOLIC PANEL
ANION GAP: 7 (ref 5–15)
BUN: 50 mg/dL — ABNORMAL HIGH (ref 6–20)
CALCIUM: 9 mg/dL (ref 8.9–10.3)
CO2: 29 mmol/L (ref 22–32)
Chloride: 102 mmol/L (ref 101–111)
Creatinine, Ser: 1.3 mg/dL — ABNORMAL HIGH (ref 0.61–1.24)
GFR, EST AFRICAN AMERICAN: 53 mL/min — AB (ref >60)
GFR, EST NON AFRICAN AMERICAN: 46 mL/min — AB (ref >60)
Glucose, Bld: 103 mg/dL — ABNORMAL HIGH (ref 65–99)
Potassium: 3.6 mmol/L (ref 3.5–5.1)
SODIUM: 138 mmol/L (ref 135–145)

## 2016-08-18 MED ORDER — CYCLOSPORINE 0.05 % OP EMUL: 1.0000 [drp] | Freq: Two times a day (BID) | OPHTHALMIC | 0 refills | Status: AC

## 2016-08-18 MED ORDER — AMLODIPINE BESYLATE 2.5 MG PO TABS: 2.5000 mg | ORAL_TABLET | Freq: Every day | ORAL | 0 refills | Status: DC

## 2016-08-18 MED ORDER — DOXYCYCLINE HYCLATE 100 MG PO TABS: 100.0000 mg | ORAL_TABLET | Freq: Two times a day (BID) | ORAL | 0 refills | Status: DC

## 2016-08-18 MED ORDER — LATANOPROST 0.005 % OP SOLN: 1.0000 [drp] | Freq: Every day | OPHTHALMIC | 12 refills | Status: AC

## 2016-08-18 MED ORDER — METOPROLOL SUCCINATE ER 25 MG PO TB24: 12.5000 mg | ORAL_TABLET | Freq: Every day | ORAL | Status: AC

## 2016-08-18 MED ORDER — INSULIN GLARGINE 100 UNIT/ML ~~LOC~~ SOLN: 8.0000 [IU] | Freq: Every day | SUBCUTANEOUS | 11 refills | Status: AC

## 2016-08-18 MED ORDER — PRO-STAT SUGAR FREE PO LIQD: 30.0000 mL | Freq: Every day | ORAL | 0 refills | Status: AC

## 2016-08-18 MED ORDER — MINOCYCLINE HCL 100 MG PO CAPS: 100.0000 mg | ORAL_CAPSULE | Freq: Two times a day (BID) | ORAL | Status: DC

## 2016-08-18 NOTE — Progress Notes (Signed)
  Echocardiogram 2D Echocardiogram has been performed.  Guy [name] 08/18/2016, 1:45 PM

## 2016-08-18 NOTE — Care Management Note (Signed)
Case Management Note  Patient Details  Name: Guy [name] MRN: [id number] Date of Birth: 1920-05-19  Subjective/Objective:                  CHF  Action/Plan: CM spoke with the patient and his wife at the bedside. Patient's wife states he currently has home health services with [facility]. She reports he has PT and RN visits. States she would like for him to receive an increase in PT visits. Prior to admission the physical therapist was visiting once a week. Discussed speaking with the physical therapist about her request when he/she visits after discharged. CM contacted [facility]. Tyler notified of discharge scheduled for today.   Expected Discharge Date:     08/18/2016             Expected Discharge Plan:  [place]  In-House Referral:     Discharge planning Services  CM Consult  Post Acute Care Choice:  Resumption of Svcs/PTA Provider Choice offered to:  Spouse, Patient  DME Arranged:  N/A DME Agency:  NA  HH Arranged:  RN, PT HH Agency:  [facility]  Status of Service:  Completed, signed off  If discussed at [place] of Stay Meetings, dates discussed:    Additional Comments:  Apolonio Schneiders, RN 08/18/2016, 1:35 PM

## 2016-08-18 NOTE — Progress Notes (Signed)
Discharge instructions reviewed with patient, wife and caregiver.  WEnt through all medications with wife and caregiver, questions were answered and they both verbalized understanding.  Patient gotten dressed by caregiver and transported via wheelchair to main entrance to be taken home by wife.

## 2016-08-18 NOTE — Discharge Summary (Signed)
Physician Discharge Summary  Guy [name] UYQ:[id number] DOB: September 26, 1920 DOA: 08/14/2016  PCP: Guy Patricia, MD  Admit date: 08/14/2016 Discharge date: 08/18/2016  Admitted From: Home Disposition:  Home  Recommendations for Outpatient Follow-up:  1. Follow up with PCP in 1-2 weeks 2. Follow up with Cardiologist within the next month 3. Please obtain BMP/CBC in one week 4. Take antibiotic as prescribed  Home Health:Yes- Patient receives PT/OT at home and will need to continue this  Equipment/Devices: None- has equipment at home  Discharge Condition:Stable but guarded CODE STATUS: DNR  Diet recommendation: Heart Healthy / Carb Modified  Brief/Interim Summary: Guy [name] a 82 y.o.malewith medical history significant of chronic combined systolic and diastolic CHF with EF of 63-87%,[id number], hyperlipidemia, diabetes mellitus, COPD on 3Loxygen at night, GERD, depression, stroke, metastasized prostate cancer (s/p of surgery 1982, s/p of implant), indwelling foley catheter, CAD, s/p of stent placement, chronically elevated troponin (at baseline 0.2-0.3), who presents with a shortness of breath.  Patient has been having shortness stress in the past several days, which has been progressively getting worse. He has dry cough, but no chest pain. No fever or chills. Patient denies nausea, vomiting, abdominal pain, diarrhea, symptoms of UTI. No tenderness over calf areas.  Pt was found to have BNP 532.2, troponin 0.12, WBC 11.6, electrolytes renal function okay, temperature normal, no tachycardia, oxygen saturation 99% on room air. Hypoglycemia with blood sugar 54. CXR showed bilateral central airspace opacification raises concern for multifocal pneumonia, though interstitial edema might have a similar appearance. Pt is admitted to tele bed as inpt. Patient was diuresed and developed a slight AKI.  He was monitored off diuretics and improved.  He was discharged home on room air  and in improved condition.  Discharge Diagnoses:  Principal Problem:   Acute on chronic respiratory failure with hypoxia (HCC) Active Problems:   Prostate cancer metastatic to multiple sites [facility])   Essential hypertension, benign   Hyperlipidemia   CAD (coronary artery disease)   Elevated troponin   Malnutrition of moderate degree (HCC)   Hypoglycemia   GERD (gastroesophageal reflux disease)   Depression   Type 2 diabetes mellitus without complication (HCC)   Acute on chronic combined systolic and diastolic congestive heart failure (HCC)   Pressure ulcer, stage III (HCC)   CAP (community acquired pneumonia)   Acute on chronic respiratory failure with hypoxia ([place]): -Etiology unclear - CXR shows bilateral infiltrates - patient reports feeling better and is off oxygen now -will treat CHF exacerbation and possible CAP as below - patient appears very frail and ill  Acute on chronic combined systolic and diastolic congestive heart failure: 2-D echo on 05/23/15 showed EF of 45-50% with grade 1 diastolic dysfunction. Patient presents with elevated BNP 532, and positive JVD, indicating possible CHF exacerbation.  - will continue home metoprolol, ASA - start lisinopril 2.'5mg'$  daily - Daily weights - strict I/O's (+239m yesterday) - wife is requesting regular diet - Dr. BAlvester Chounotified  Possible CAP: Pt is not septic on admission - levaquin started yesterday - continue doxycycline for 3 additional days outpatient - Mucinex for cough  - prn Atrovent nebsprn for SOB - Urine legionella negative - S. pneumococcal antigen negative - Blood culture no growth x 2 days - lactic acid level WNL  Prostate cancer metastatic to multiple sites ([facility]: s/p of surgery 1982, s/p of implant, has indwelling foley catheter, which is monthly changed by urology. - chronic foley - urine culture ordered  HTN: -continue amlodipine but  at 2.'5mg'$  - will hold lisinopril due to elevated  creatinine  LZJ:QBHA LDL was 37 on 12/30/12 -Continue home medications: Zocor - Question of whether patient needs to even continue statin at his age  Chronically elevated trop and CAD:  - s/p of stent - Baseline troponin 0.2 dL 0.3 - No chest pain. -Continue aspirin, Zocor, Toprol, when necessary nitroglycerin - Troponin stable - can schedule echocardiogram outpatient - poor candidate for any invasive procedure  Malnutrition of severe degree ([place]): -Consultation nutrition   Hypoglycemia: - resolved  DM-II: - HgA1c of 5.5 - can restart Lantus at lower dose than home dose - Can start Lantus 8 units today  GERD: -Protonix  Pressure ulcer, stage III ([place]): - patient to receive wound care at home  Depression: -will hold zoloft due to QTC prolongation   Discharge Instructions  Discharge Instructions    Call MD for:  difficulty breathing, headache or visual disturbances    Complete by:  As directed    Call MD for:  extreme fatigue    Complete by:  As directed    Call MD for:  persistant dizziness or light-headedness    Complete by:  As directed    Call MD for:  persistant nausea and vomiting    Complete by:  As directed    Call MD for:  redness, tenderness, or signs of infection (pain, swelling, redness, odor or green/yellow discharge around incision site)    Complete by:  As directed    Call MD for:  severe uncontrolled pain    Complete by:  As directed    Call MD for:  temperature >100.4    Complete by:  As directed    Diet - low sodium heart healthy    Complete by:  As directed    Diet Carb Modified    Complete by:  As directed    Increase activity slowly    Complete by:  As directed        Medication List    STOP taking these medications   insulin aspart 100 UNIT/ML injection Commonly known as:  novoLOG   PRESCRIPTION MEDICATION     TAKE these medications   amLODipine 2.5 MG tablet Commonly known as:  NORVASC Take 1 tablet (2.5 mg  total) by mouth daily. Start taking on:  08/19/2016 What changed:  medication strength  how much to take   aspirin 81 MG tablet Take 81 mg by mouth daily. For heart theraphy   bicalutamide 50 MG tablet Commonly known as:  CASODEX Take 50 mg by mouth daily.   CALCIUM 600+D HIGH POTENCY 600-400 MG-UNIT tablet Generic drug:  Calcium Carbonate-Vitamin D Take 1 tablet by mouth 2 (two) times daily.   CRANBERRY CONCENTRATE PO Take 1 capsule by mouth 3 (three) times daily.   cycloSPORINE 0.05 % ophthalmic emulsion Commonly known as:  RESTASIS Place 1 drop into both eyes 2 (two) times daily.   doxycycline 100 MG tablet Commonly known as:  VIBRA-TABS Take 1 tablet (100 mg total) by mouth every 12 (twelve) hours.   feeding supplement (PRO-STAT SUGAR FREE 64) Liqd Take 30 mLs by mouth daily. Start taking on:  08/19/2016   gabapentin 100 MG capsule Commonly known as:  NEURONTIN Take 100 mg by mouth 3 (three) times daily.   insulin glargine 100 UNIT/ML injection Commonly known as:  LANTUS Inject 0.08 mLs (8 Units total) into the skin at bedtime. What changed:  how much to take  when to take this  latanoprost 0.005 % ophthalmic solution Commonly known as:  XALATAN Place 1 drop into the right eye at bedtime.   metoprolol succinate 25 MG 24 hr tablet Commonly known as:  TOPROL-XL Take 0.5 tablets (12.5 mg total) by mouth daily. Start taking on:  08/19/2016 What changed:  additional instructions   minocycline 100 MG capsule Commonly known as:  MINOCIN,DYNACIN Take 1 capsule (100 mg total) by mouth 2 (two) times daily. Start taking on:  08/22/2016   HAIR/SKIN/NAILS Tabs Take 1 tablet by mouth 3 (three) times daily.   multivitamins ther. w/minerals Tabs tablet Take 1 tablet by mouth every morning.   nitroGLYCERIN 0.4 MG SL tablet Commonly known as:  NITROSTAT Place 0.4 mg under the tongue every 5 (five) minutes as needed for chest pain.   omeprazole 20 MG  capsule Commonly known as:  PRILOSEC Take 20 mg by mouth every other day. For GERD   Pancrelipase (Lip-Prot-Amyl) 5000 units Cpep Take 5,000 Units by mouth 3 (three) times daily.   senna-docusate 8.6-50 MG tablet Commonly known as:  SENOKOT S Take 1 tablet by mouth at bedtime. For constipation   sertraline 100 MG tablet Commonly known as:  ZOLOFT Take 200 mg by mouth daily. For depression   simvastatin 20 MG tablet Commonly known as:  ZOCOR Take 10 mg by mouth every other day. Take 1/2 tablet =10 mg for HLD.       Allergies  Allergen Reactions  . Albuterol Sulfate Hfa [Kdc:Albuterol] Shortness Of Breath and Swelling  . Adhesive [Tape] Other (See Comments)    REACTION: SKIN BLISTERS  . Albuterol Swelling    Per pt., side of his neck began to swell with nebulized Albuterol in doctor's office.  . Contrast Media [Iodinated Diagnostic Agents] Other (See Comments)    unknown Reaction unknown  . Ioxaglate Other (See Comments)    Reaction unknown  . Lactose     Other reaction(s): GI Upset (intolerance)  . Metrizamide Other (See Comments)    Reaction unknown  . Penicillins Itching and Other (See Comments)    REACTION: ITCHING HANDS Has patient had a PCN reaction causing immediate rash, facial/tongue/throat swelling, SOB or lightheadedness with hypotension: No Has patient had a PCN reaction causing severe rash involving mucus membranes or skin necrosis: No Has patient had a PCN reaction that required hospitalization Yes Has patient had a PCN reaction occurring within the last 10 years: No If all of the above answers are "NO", then may proceed with Cephalosporin use.   . Sulfa Drugs Cross Reactors Hives  . Budesonide-Formoterol Fumarate Rash  . Sulfamethoxazole Rash    Consultations:  PT/OT   Procedures/Studies: Dg Chest Port 1 View  Result Date: 08/15/2016 CLINICAL DATA:  Acute onset of shortness of breath. Initial encounter. EXAM: PORTABLE CHEST 1 VIEW COMPARISON:   Chest radiograph performed 05/20/2015 FINDINGS: The lungs are relatively well-aerated. Bilateral central airspace opacification raises concern for multifocal pneumonia, though interstitial edema might have a similar appearance. A small left pleural effusion is suspected. No pneumothorax is seen, though the lung apices are partially obscured by the patient's head. The cardiomediastinal silhouette is borderline normal in size. No acute osseous abnormalities are identified. IMPRESSION: Bilateral central airspace opacification raises concern for multifocal pneumonia, though interstitial edema might have a similar appearance. Small left pleural effusion suspected. Electronically Signed   By: Garald Balding M.D.   On: 08/15/2016 03:07       Subjective: Patient seen and evaluated.  He looks significantly more alert this  morning.  Says he is doing well for a man his age.  Wife is very excited to get him home.  Will resume outpatient therapy.  Patient already has an aid that stays with him all day.  Discharge Exam: Vitals:   08/18/16 0437 08/18/16 1200  BP: (!) 109/54 (!) 131/59  Pulse: 60 64  Resp: 18 18  Temp: 97.3 F (36.3 C) 97.7 F (36.5 C)   Vitals:   08/17/16 1502 08/17/16 1950 08/18/16 0437 08/18/16 1200  BP: (!) 104/46 126/60 (!) 109/54 (!) 131/59  Pulse:  65 60 64  Resp:  '18 18 18  '$ Temp:  98.3 F (36.8 C) 97.3 F (36.3 C) 97.7 F (36.5 C)  TempSrc:  Oral Oral Oral  SpO2:  100% 97% 94%  Weight:   44.6 kg (98 lb 6.4 oz)   Height:        General: Pt is alert, awake, not in acute distress but is very frail, cachectic appearing Cardiovascular: RRR, S1/S2 +, no rubs, no gallops Respiratory: CTA bilaterally, no wheezing, no rhonchi Abdominal: Soft, NT, ND, bowel sounds + Extremities: no edema, no cyanosis but patient body has significant muscle wasting    The results of significant diagnostics from this hospitalization (including imaging, microbiology, ancillary and laboratory) are  listed below for reference.     Microbiology: Recent Results (from the past 240 hour(s))  Culture, blood (routine x 2) Call MD if unable to obtain prior to antibiotics being given     Status: None (Preliminary result)   Collection Time: 08/15/16  7:19 AM  Result Value Ref Range Status   Specimen Description BLOOD RIGHT ANTECUBITAL  Final   Special Requests   Final    BOTTLES DRAWN AEROBIC AND ANAEROBIC 10CC AER 5CC ANA   Culture NO GROWTH 2 DAYS  Final   Report Status PENDING  Incomplete  Culture, blood (routine x 2) Call MD if unable to obtain prior to antibiotics being given     Status: None (Preliminary result)   Collection Time: 08/15/16  7:28 AM  Result Value Ref Range Status   Specimen Description BLOOD RIGHT HAND  Final   Special Requests BOTTLES DRAWN AEROBIC ONLY 5CC  Final   Culture NO GROWTH 2 DAYS  Final   Report Status PENDING  Incomplete  Culture, Urine     Status: Abnormal (Preliminary result)   Collection Time: 08/17/16  8:26 AM  Result Value Ref Range Status   Specimen Description URINE, RANDOM  Final   Special Requests NONE  Final   Culture >=100,000 COLONIES/mL GRAM NEGATIVE RODS (A)  Final   Report Status PENDING  Incomplete     Labs: BNP (last 3 results)  Recent Labs  08/15/16 0008  BNP 518.8*   Basic Metabolic Panel:  Recent Labs Lab 08/15/16 0008 08/16/16 0331 08/17/16 0541 08/18/16 0323  NA 137 135 139 138  K 3.7 3.9 3.4* 3.6  CL 105 100* 101 102  CO2 '26 29 30 29  '$ GLUCOSE 54* 232* 101* 103*  BUN 38* 37* 46* 50*  CREATININE 1.07 1.28* 1.37* 1.30*  CALCIUM 8.8* 8.8* 8.9 9.0   Liver Function Tests: No results for input(s): AST, ALT, ALKPHOS, BILITOT, PROT, ALBUMIN in the last 168 hours. No results for input(s): LIPASE, AMYLASE in the last 168 hours. No results for input(s): AMMONIA in the last 168 hours. CBC:  Recent Labs Lab 08/15/16 0008  WBC 11.6*  NEUTROABS 9.1*  HGB 10.5*  HCT 31.3*  MCV 98.7  PLT 144*   Cardiac  Enzymes:  Recent Labs Lab 08/15/16 0008 08/15/16 0314 08/15/16 0718 08/15/16 1631  TROPONINI 0.12* 0.11* 0.12* 0.12*   BNP: Invalid input(s): POCBNP CBG:  Recent Labs Lab 08/17/16 1135 08/17/16 1635 08/17/16 2234 08/18/16 0703 08/18/16 1153  GLUCAP 147* 184* 118* 106* 223*   D-Dimer No results for input(s): DDIMER in the last 72 hours. Hgb A1c No results for input(s): HGBA1C in the last 72 hours. Lipid Profile No results for input(s): CHOL, HDL, LDLCALC, TRIG, CHOLHDL, LDLDIRECT in the last 72 hours. Thyroid function studies No results for input(s): TSH, T4TOTAL, T3FREE, THYROIDAB in the last 72 hours.  Invalid input(s): FREET3 Anemia work up No results for input(s): VITAMINB12, FOLATE, FERRITIN, TIBC, IRON, RETICCTPCT in the last 72 hours. Urinalysis    Component Value Date/Time   COLORURINE YELLOW 08/15/2016 0750   APPEARANCEUR CLOUDY (A) 08/15/2016 0750   LABSPEC 1.017 08/15/2016 0750   PHURINE 5.5 08/15/2016 0750   GLUCOSEU NEGATIVE 08/15/2016 0750   HGBUR MODERATE (A) 08/15/2016 0750   BILIRUBINUR NEGATIVE 08/15/2016 0750   KETONESUR NEGATIVE 08/15/2016 0750   PROTEINUR NEGATIVE 08/15/2016 0750   UROBILINOGEN 0.2 07/06/2015 1606   NITRITE POSITIVE (A) 08/15/2016 0750   LEUKOCYTESUR LARGE (A) 08/15/2016 0750   Sepsis Labs Invalid input(s): PROCALCITONIN,  WBC,  LACTICIDVEN Microbiology Recent Results (from the past 240 hour(s))  Culture, blood (routine x 2) Call MD if unable to obtain prior to antibiotics being given     Status: None (Preliminary result)   Collection Time: 08/15/16  7:19 AM  Result Value Ref Range Status   Specimen Description BLOOD RIGHT ANTECUBITAL  Final   Special Requests   Final    BOTTLES DRAWN AEROBIC AND ANAEROBIC 10CC AER 5CC ANA   Culture NO GROWTH 2 DAYS  Final   Report Status PENDING  Incomplete  Culture, blood (routine x 2) Call MD if unable to obtain prior to antibiotics being given     Status: None (Preliminary result)    Collection Time: 08/15/16  7:28 AM  Result Value Ref Range Status   Specimen Description BLOOD RIGHT HAND  Final   Special Requests BOTTLES DRAWN AEROBIC ONLY 5CC  Final   Culture NO GROWTH 2 DAYS  Final   Report Status PENDING  Incomplete  Culture, Urine     Status: Abnormal (Preliminary result)   Collection Time: 08/17/16  8:26 AM  Result Value Ref Range Status   Specimen Description URINE, RANDOM  Final   Special Requests NONE  Final   Culture >=100,000 COLONIES/mL GRAM NEGATIVE RODS (A)  Final   Report Status PENDING  Incomplete     Time coordinating discharge: Over 30 minutes  SIGNED:   Loretha Stapler, MD  [facility] 08/18/2016, 1:15 PM Pager [phone] If 7PM-7AM, please contact night-coverage [email or web address] Password [email or web address]

## 2016-08-18 NOTE — Plan of Care (Signed)
Problem: Skin Integrity: Goal: Risk for impaired skin integrity will decrease Outcome: Adequate for Discharge Patient had chronic skin issues on admission  Problem: Activity: Goal: Risk for activity intolerance will decrease Outcome: Adequate for Discharge Patient dependent in ADL;s and chairbound on admit  Problem: Nutrition: Goal: Adequate nutrition will be maintained Outcome: Adequate for Discharge Poor nutrition, per family supplements in use

## 2016-08-19 LAB — URINE CULTURE

## 2016-08-19 NOTE — Progress Notes (Signed)
Received page from nurse that patient's wife called and is requesting medications be sent to Dr. Clydene Laming at [facility] via fax.  I faxed new prescriptions (doxycycline, amlodipine- new dose, and insulin- new dose) to fax number provided by caregiver [phone] and received confirmation the fax went thru.

## 2016-08-20 LAB — CULTURE, BLOOD (ROUTINE X 2)
CULTURE: NO GROWTH
Culture: NO GROWTH

## 2016-08-21 ENCOUNTER — Emergency Department (HOSPITAL_COMMUNITY): Payer: Medicare Other

## 2016-08-21 ENCOUNTER — Inpatient Hospital Stay (HOSPITAL_COMMUNITY)
Admission: EM | Admit: 2016-08-21 | Discharge: 2016-08-25 | DRG: 189 | Disposition: A | Discharge status: Hospice | Payer: Medicare Other | Attending: Internal Medicine | Admitting: Internal Medicine

## 2016-08-21 DIAGNOSIS — R748 Abnormal levels of other serum enzymes: Secondary | ICD-10-CM | POA: Diagnosis present

## 2016-08-21 DIAGNOSIS — Z7982 Long term (current) use of aspirin: Secondary | ICD-10-CM

## 2016-08-21 DIAGNOSIS — Z515 Encounter for palliative care: Secondary | ICD-10-CM | POA: Diagnosis present

## 2016-08-21 DIAGNOSIS — Z794 Long term (current) use of insulin: Secondary | ICD-10-CM

## 2016-08-21 DIAGNOSIS — Z91041 Radiographic dye allergy status: Secondary | ICD-10-CM

## 2016-08-21 DIAGNOSIS — I130 Hypertensive heart and chronic kidney disease with heart failure and stage 1 through stage 4 chronic kidney disease, or unspecified chronic kidney disease: Secondary | ICD-10-CM | POA: Diagnosis present

## 2016-08-21 DIAGNOSIS — C61 Malignant neoplasm of prostate: Secondary | ICD-10-CM | POA: Diagnosis present

## 2016-08-21 DIAGNOSIS — Z87891 Personal history of nicotine dependence: Secondary | ICD-10-CM

## 2016-08-21 DIAGNOSIS — M810 Age-related osteoporosis without current pathological fracture: Secondary | ICD-10-CM | POA: Diagnosis present

## 2016-08-21 DIAGNOSIS — Z9841 Cataract extraction status, right eye: Secondary | ICD-10-CM

## 2016-08-21 DIAGNOSIS — J449 Chronic obstructive pulmonary disease, unspecified: Secondary | ICD-10-CM | POA: Diagnosis present

## 2016-08-21 DIAGNOSIS — J189 Pneumonia, unspecified organism: Secondary | ICD-10-CM

## 2016-08-21 DIAGNOSIS — Z888 Allergy status to other drugs, medicaments and biological substances status: Secondary | ICD-10-CM

## 2016-08-21 DIAGNOSIS — Z9981 Dependence on supplemental oxygen: Secondary | ICD-10-CM

## 2016-08-21 DIAGNOSIS — R0602 Shortness of breath: Secondary | ICD-10-CM | POA: Diagnosis not present

## 2016-08-21 DIAGNOSIS — I2510 Atherosclerotic heart disease of native coronary artery without angina pectoris: Secondary | ICD-10-CM | POA: Diagnosis present

## 2016-08-21 DIAGNOSIS — R778 Other specified abnormalities of plasma proteins: Secondary | ICD-10-CM | POA: Diagnosis present

## 2016-08-21 DIAGNOSIS — J9621 Acute and chronic respiratory failure with hypoxia: Principal | ICD-10-CM | POA: Diagnosis present

## 2016-08-21 DIAGNOSIS — D638 Anemia in other chronic diseases classified elsewhere: Secondary | ICD-10-CM | POA: Diagnosis present

## 2016-08-21 DIAGNOSIS — E1122 Type 2 diabetes mellitus with diabetic chronic kidney disease: Secondary | ICD-10-CM | POA: Diagnosis present

## 2016-08-21 DIAGNOSIS — L89153 Pressure ulcer of sacral region, stage 3: Secondary | ICD-10-CM | POA: Diagnosis present

## 2016-08-21 DIAGNOSIS — Z882 Allergy status to sulfonamides status: Secondary | ICD-10-CM

## 2016-08-21 DIAGNOSIS — Z8249 Family history of ischemic heart disease and other diseases of the circulatory system: Secondary | ICD-10-CM

## 2016-08-21 DIAGNOSIS — R64 Cachexia: Secondary | ICD-10-CM | POA: Diagnosis present

## 2016-08-21 DIAGNOSIS — C799 Secondary malignant neoplasm of unspecified site: Secondary | ICD-10-CM | POA: Diagnosis present

## 2016-08-21 DIAGNOSIS — N183 Chronic kidney disease, stage 3 (moderate): Secondary | ICD-10-CM | POA: Diagnosis present

## 2016-08-21 DIAGNOSIS — K219 Gastro-esophageal reflux disease without esophagitis: Secondary | ICD-10-CM | POA: Diagnosis present

## 2016-08-21 DIAGNOSIS — Z66 Do not resuscitate: Secondary | ICD-10-CM | POA: Diagnosis present

## 2016-08-21 DIAGNOSIS — Z955 Presence of coronary angioplasty implant and graft: Secondary | ICD-10-CM

## 2016-08-21 DIAGNOSIS — E43 Unspecified severe protein-calorie malnutrition: Secondary | ICD-10-CM | POA: Diagnosis present

## 2016-08-21 DIAGNOSIS — Z681 Body mass index (BMI) 19 or less, adult: Secondary | ICD-10-CM

## 2016-08-21 DIAGNOSIS — Z9842 Cataract extraction status, left eye: Secondary | ICD-10-CM

## 2016-08-21 DIAGNOSIS — Z880 Allergy status to penicillin: Secondary | ICD-10-CM

## 2016-08-21 DIAGNOSIS — I5043 Acute on chronic combined systolic (congestive) and diastolic (congestive) heart failure: Secondary | ICD-10-CM | POA: Diagnosis present

## 2016-08-21 DIAGNOSIS — Z961 Presence of intraocular lens: Secondary | ICD-10-CM | POA: Diagnosis present

## 2016-08-21 DIAGNOSIS — E785 Hyperlipidemia, unspecified: Secondary | ICD-10-CM | POA: Diagnosis present

## 2016-08-21 DIAGNOSIS — L8993 Pressure ulcer of unspecified site, stage 3: Secondary | ICD-10-CM | POA: Diagnosis present

## 2016-08-21 DIAGNOSIS — Y95 Nosocomial condition: Secondary | ICD-10-CM | POA: Diagnosis present

## 2016-08-21 DIAGNOSIS — I4581 Long QT syndrome: Secondary | ICD-10-CM | POA: Diagnosis present

## 2016-08-21 DIAGNOSIS — R7989 Other specified abnormal findings of blood chemistry: Secondary | ICD-10-CM

## 2016-08-21 DIAGNOSIS — D696 Thrombocytopenia, unspecified: Secondary | ICD-10-CM | POA: Diagnosis present

## 2016-08-21 DIAGNOSIS — Z91011 Allergy to milk products: Secondary | ICD-10-CM

## 2016-08-21 DIAGNOSIS — Z79899 Other long term (current) drug therapy: Secondary | ICD-10-CM

## 2016-08-21 NOTE — ED Triage Notes (Signed)
Pt from home via [facility]. Wife stated that pt was [facility] so she called EMS. Wife sts he was seen and d/c for same on Sunday. Per EMS pt was showing no signs of acute distress w/ clear lung sounds on arrival to home. Hx of CHF. Pt compliant w/ home meds. Pt denies any SHOB & CP at this time. RR even and unlabored on 3L [place]. Pt is on O2 @ night per wife. Pt very hard of hearing.

## 2016-08-22 ENCOUNTER — Encounter (HOSPITAL_COMMUNITY): Payer: Self-pay | Admitting: *Deleted

## 2016-08-22 ENCOUNTER — Observation Stay (HOSPITAL_COMMUNITY): Payer: Medicare Other

## 2016-08-22 DIAGNOSIS — E785 Hyperlipidemia, unspecified: Secondary | ICD-10-CM | POA: Diagnosis present

## 2016-08-22 DIAGNOSIS — R748 Abnormal levels of other serum enzymes: Secondary | ICD-10-CM | POA: Diagnosis present

## 2016-08-22 DIAGNOSIS — C61 Malignant neoplasm of prostate: Secondary | ICD-10-CM | POA: Diagnosis present

## 2016-08-22 DIAGNOSIS — Z9981 Dependence on supplemental oxygen: Secondary | ICD-10-CM | POA: Diagnosis not present

## 2016-08-22 DIAGNOSIS — E119 Type 2 diabetes mellitus without complications: Secondary | ICD-10-CM

## 2016-08-22 DIAGNOSIS — Z681 Body mass index (BMI) 19 or less, adult: Secondary | ICD-10-CM | POA: Diagnosis not present

## 2016-08-22 DIAGNOSIS — C799 Secondary malignant neoplasm of unspecified site: Secondary | ICD-10-CM | POA: Diagnosis present

## 2016-08-22 DIAGNOSIS — Z961 Presence of intraocular lens: Secondary | ICD-10-CM | POA: Diagnosis present

## 2016-08-22 DIAGNOSIS — Z515 Encounter for palliative care: Secondary | ICD-10-CM | POA: Diagnosis not present

## 2016-08-22 DIAGNOSIS — Z66 Do not resuscitate: Secondary | ICD-10-CM | POA: Diagnosis present

## 2016-08-22 DIAGNOSIS — Z794 Long term (current) use of insulin: Secondary | ICD-10-CM

## 2016-08-22 DIAGNOSIS — M810 Age-related osteoporosis without current pathological fracture: Secondary | ICD-10-CM | POA: Diagnosis present

## 2016-08-22 DIAGNOSIS — E1122 Type 2 diabetes mellitus with diabetic chronic kidney disease: Secondary | ICD-10-CM | POA: Diagnosis present

## 2016-08-22 DIAGNOSIS — J9621 Acute and chronic respiratory failure with hypoxia: Secondary | ICD-10-CM | POA: Diagnosis not present

## 2016-08-22 DIAGNOSIS — I2510 Atherosclerotic heart disease of native coronary artery without angina pectoris: Secondary | ICD-10-CM | POA: Diagnosis present

## 2016-08-22 DIAGNOSIS — I5043 Acute on chronic combined systolic (congestive) and diastolic (congestive) heart failure: Secondary | ICD-10-CM | POA: Diagnosis not present

## 2016-08-22 DIAGNOSIS — Y95 Nosocomial condition: Secondary | ICD-10-CM | POA: Diagnosis present

## 2016-08-22 DIAGNOSIS — E43 Unspecified severe protein-calorie malnutrition: Secondary | ICD-10-CM | POA: Diagnosis present

## 2016-08-22 DIAGNOSIS — R64 Cachexia: Secondary | ICD-10-CM | POA: Diagnosis present

## 2016-08-22 DIAGNOSIS — R0602 Shortness of breath: Secondary | ICD-10-CM | POA: Diagnosis present

## 2016-08-22 DIAGNOSIS — I130 Hypertensive heart and chronic kidney disease with heart failure and stage 1 through stage 4 chronic kidney disease, or unspecified chronic kidney disease: Secondary | ICD-10-CM | POA: Diagnosis present

## 2016-08-22 DIAGNOSIS — J189 Pneumonia, unspecified organism: Secondary | ICD-10-CM | POA: Diagnosis not present

## 2016-08-22 DIAGNOSIS — L89153 Pressure ulcer of sacral region, stage 3: Secondary | ICD-10-CM

## 2016-08-22 DIAGNOSIS — J449 Chronic obstructive pulmonary disease, unspecified: Secondary | ICD-10-CM | POA: Diagnosis present

## 2016-08-22 DIAGNOSIS — D696 Thrombocytopenia, unspecified: Secondary | ICD-10-CM | POA: Diagnosis present

## 2016-08-22 DIAGNOSIS — N183 Chronic kidney disease, stage 3 (moderate): Secondary | ICD-10-CM | POA: Diagnosis present

## 2016-08-22 DIAGNOSIS — K219 Gastro-esophageal reflux disease without esophagitis: Secondary | ICD-10-CM | POA: Diagnosis present

## 2016-08-22 DIAGNOSIS — D638 Anemia in other chronic diseases classified elsewhere: Secondary | ICD-10-CM | POA: Diagnosis not present

## 2016-08-22 LAB — CBC WITH DIFFERENTIAL/PLATELET
BASOS PCT: 0 %
Basophils Absolute: 0 10*3/uL (ref 0.0–0.1)
Basophils Absolute: 0.1 10*3/uL (ref 0.0–0.1)
Basophils Relative: 1 %
EOS ABS: 0.5 10*3/uL (ref 0.0–0.7)
Eosinophils Absolute: 0.6 10*3/uL (ref 0.0–0.7)
Eosinophils Relative: 10 %
Eosinophils Relative: 7 %
HEMATOCRIT: 28.8 % — AB (ref 39.0–52.0)
HEMATOCRIT: 29.2 % — AB (ref 39.0–52.0)
HEMOGLOBIN: 9.5 g/dL — AB (ref 13.0–17.0)
HEMOGLOBIN: 9.5 g/dL — AB (ref 13.0–17.0)
LYMPHS ABS: 1.2 10*3/uL (ref 0.7–4.0)
LYMPHS ABS: 1 10*3/uL (ref 0.7–4.0)
LYMPHS PCT: 17 %
Lymphocytes Relative: 18 %
MCH: 33.2 pg (ref 26.0–34.0)
MCH: 32.9 pg (ref 26.0–34.0)
MCHC: 33 g/dL (ref 30.0–36.0)
MCHC: 32.5 g/dL (ref 30.0–36.0)
MCV: 100.7 fL — AB (ref 78.0–100.0)
MCV: 101 fL — ABNORMAL HIGH (ref 78.0–100.0)
MONOS PCT: 9 %
MONOS PCT: 10 %
Monocytes Absolute: 0.6 10*3/uL (ref 0.1–1.0)
Monocytes Absolute: 0.6 10*3/uL (ref 0.1–1.0)
NEUTROS ABS: 3.6 10*3/uL (ref 1.7–7.7)
NEUTROS PCT: 66 %
NEUTROS PCT: 62 %
Neutro Abs: 4.8 10*3/uL (ref 1.7–7.7)
Platelets: 101 10*3/uL — ABNORMAL LOW (ref 150–400)
Platelets: 130 10*3/uL — ABNORMAL LOW (ref 150–400)
RBC: 2.86 MIL/uL — ABNORMAL LOW (ref 4.22–5.81)
RBC: 2.89 MIL/uL — AB (ref 4.22–5.81)
RDW: 15.6 % — ABNORMAL HIGH (ref 11.5–15.5)
RDW: 15.7 % — ABNORMAL HIGH (ref 11.5–15.5)
WBC: 5.7 10*3/uL (ref 4.0–10.5)
WBC: 7.3 10*3/uL (ref 4.0–10.5)

## 2016-08-22 LAB — MAGNESIUM: Magnesium: 1.9 mg/dL (ref 1.7–2.4)

## 2016-08-22 LAB — URINALYSIS, ROUTINE W REFLEX MICROSCOPIC
Bilirubin Urine: NEGATIVE
Glucose, UA: NEGATIVE mg/dL
Ketones, ur: NEGATIVE mg/dL
Nitrite: NEGATIVE
Protein, ur: 30 mg/dL — AB
Specific Gravity, Urine: 1.019 (ref 1.005–1.030)
pH: 5.5 (ref 5.0–8.0)

## 2016-08-22 LAB — GLUCOSE, CAPILLARY
GLUCOSE-CAPILLARY: 194 mg/dL — AB (ref 65–99)
GLUCOSE-CAPILLARY: 214 mg/dL — AB (ref 65–99)
GLUCOSE-CAPILLARY: 187 mg/dL — AB (ref 65–99)
GLUCOSE-CAPILLARY: 136 mg/dL — AB (ref 65–99)

## 2016-08-22 LAB — BASIC METABOLIC PANEL
Anion gap: 7 (ref 5–15)
BUN: 48 mg/dL — ABNORMAL HIGH (ref 6–20)
CO2: 26 mmol/L (ref 22–32)
Calcium: 8.7 mg/dL — ABNORMAL LOW (ref 8.9–10.3)
Chloride: 107 mmol/L (ref 101–111)
Creatinine, Ser: 1.19 mg/dL (ref 0.61–1.24)
GFR calc Af Amer: 59 mL/min — ABNORMAL LOW (ref >60)
GFR calc non Af Amer: 51 mL/min — ABNORMAL LOW (ref >60)
Glucose, Bld: 239 mg/dL — ABNORMAL HIGH (ref 65–99)
Potassium: 3.8 mmol/L (ref 3.5–5.1)
Sodium: 140 mmol/L (ref 135–145)

## 2016-08-22 LAB — TROPONIN I
TROPONIN I: 0.15 ng/mL — AB (ref <0.03)
TROPONIN I: 0.13 ng/mL — AB (ref <0.03)
Troponin I: 0.34 ng/mL (ref <0.03)
Troponin I: 0.14 ng/mL (ref <0.03)

## 2016-08-22 LAB — URINE MICROSCOPIC-ADD ON

## 2016-08-22 LAB — BRAIN NATRIURETIC PEPTIDE: B Natriuretic Peptide: 550.1 pg/mL — ABNORMAL HIGH (ref 0.0–100.0)

## 2016-08-22 LAB — D-DIMER, QUANTITATIVE: D-Dimer, Quant: 1.7 ug/mL-FEU — ABNORMAL HIGH (ref 0.00–0.50)

## 2016-08-22 MED ORDER — BICALUTAMIDE 50 MG PO TABS
50.0000 mg | ORAL_TABLET | Freq: Every day | ORAL | Status: DC
  Administered 2016-08-22 – 2016-08-25 (×4): 50 mg via ORAL
  Filled 2016-08-22 (×4): qty 1

## 2016-08-22 MED ORDER — AMLODIPINE BESYLATE 2.5 MG PO TABS
2.5000 mg | ORAL_TABLET | Freq: Every day | ORAL | Status: DC
  Administered 2016-08-22 – 2016-08-25 (×4): 2.5 mg via ORAL
  Filled 2016-08-22 (×4): qty 1

## 2016-08-22 MED ORDER — SIMVASTATIN 10 MG PO TABS
10.0000 mg | ORAL_TABLET | ORAL | Status: DC
  Administered 2016-08-23 – 2016-08-25 (×2): 10 mg via ORAL
  Filled 2016-08-22 (×2): qty 1

## 2016-08-22 MED ORDER — SODIUM CHLORIDE 0.9% FLUSH
3.0000 mL | INTRAVENOUS | Status: DC | PRN
  Administered 2016-08-23: 3 mL via INTRAVENOUS
  Filled 2016-08-22: qty 3

## 2016-08-22 MED ORDER — FUROSEMIDE 10 MG/ML IJ SOLN
20.0000 mg | Freq: Two times a day (BID) | INTRAMUSCULAR | Status: DC
  Administered 2016-08-22 – 2016-08-23 (×3): 20 mg via INTRAVENOUS
  Filled 2016-08-22 (×3): qty 2

## 2016-08-22 MED ORDER — FUROSEMIDE 10 MG/ML IJ SOLN
20.0000 mg | INTRAMUSCULAR | Status: AC
  Administered 2016-08-22: 20 mg via INTRAVENOUS
  Filled 2016-08-22: qty 2

## 2016-08-22 MED ORDER — GABAPENTIN 100 MG PO CAPS
100.0000 mg | ORAL_CAPSULE | Freq: Three times a day (TID) | ORAL | Status: DC
  Administered 2016-08-22 – 2016-08-25 (×12): 100 mg via ORAL
  Filled 2016-08-22 (×12): qty 1

## 2016-08-22 MED ORDER — VANCOMYCIN HCL IN DEXTROSE 750-5 MG/150ML-% IV SOLN
750.0000 mg | Freq: Once | INTRAVENOUS | Status: AC
  Administered 2016-08-22: 750 mg via INTRAVENOUS
  Filled 2016-08-22: qty 150

## 2016-08-22 MED ORDER — PRO-STAT SUGAR FREE PO LIQD
30.0000 mL | Freq: Every day | ORAL | Status: DC
  Administered 2016-08-22 – 2016-08-25 (×4): 30 mL via ORAL
  Filled 2016-08-22 (×4): qty 30

## 2016-08-22 MED ORDER — ENOXAPARIN SODIUM 40 MG/0.4ML ~~LOC~~ SOLN
40.0000 mg | SUBCUTANEOUS | Status: DC
  Administered 2016-08-22 – 2016-08-23 (×2): 40 mg via SUBCUTANEOUS
  Filled 2016-08-22 (×2): qty 0.4

## 2016-08-22 MED ORDER — INSULIN ASPART 100 UNIT/ML ~~LOC~~ SOLN
0.0000 [IU] | Freq: Three times a day (TID) | SUBCUTANEOUS | Status: DC
  Administered 2016-08-22: 3 [IU] via SUBCUTANEOUS
  Administered 2016-08-22 – 2016-08-25 (×6): 2 [IU] via SUBCUTANEOUS
  Administered 2016-08-25: 1 [IU] via SUBCUTANEOUS
  Administered 2016-08-25 (×2): 2 [IU] via SUBCUTANEOUS

## 2016-08-22 MED ORDER — NITROGLYCERIN 0.4 MG SL SUBL: 0.4000 mg | SUBLINGUAL_TABLET | SUBLINGUAL | Status: DC | PRN

## 2016-08-22 MED ORDER — ONDANSETRON HCL 4 MG/2ML IJ SOLN: 4.0000 mg | Freq: Four times a day (QID) | INTRAMUSCULAR | Status: DC | PRN

## 2016-08-22 MED ORDER — METOPROLOL SUCCINATE ER 25 MG PO TB24
12.5000 mg | ORAL_TABLET | Freq: Every day | ORAL | Status: DC
  Administered 2016-08-22 – 2016-08-25 (×4): 12.5 mg via ORAL
  Filled 2016-08-22 (×4): qty 1

## 2016-08-22 MED ORDER — ACETAMINOPHEN 325 MG PO TABS
650.0000 mg | ORAL_TABLET | ORAL | Status: DC | PRN
  Administered 2016-08-22: 650 mg via ORAL
  Filled 2016-08-22: qty 2

## 2016-08-22 MED ORDER — INSULIN GLARGINE 100 UNIT/ML ~~LOC~~ SOLN
8.0000 [IU] | Freq: Every day | SUBCUTANEOUS | Status: DC
  Administered 2016-08-23 – 2016-08-25 (×3): 8 [IU] via SUBCUTANEOUS
  Filled 2016-08-22 (×4): qty 0.08

## 2016-08-22 MED ORDER — CYCLOSPORINE 0.05 % OP EMUL
1.0000 [drp] | Freq: Two times a day (BID) | OPHTHALMIC | Status: DC
  Administered 2016-08-22 – 2016-08-25 (×8): 1 [drp] via OPHTHALMIC
  Filled 2016-08-22 (×8): qty 1

## 2016-08-22 MED ORDER — LATANOPROST 0.005 % OP SOLN
1.0000 [drp] | Freq: Every day | OPHTHALMIC | Status: DC
  Administered 2016-08-22 – 2016-08-25 (×4): 1 [drp] via OPHTHALMIC
  Filled 2016-08-22: qty 2.5

## 2016-08-22 MED ORDER — FENTANYL CITRATE (PF) 100 MCG/2ML IJ SOLN
100.0000 ug | Freq: Once | INTRAMUSCULAR | Status: AC
  Administered 2016-08-22: 100 ug via INTRAVENOUS
  Filled 2016-08-22: qty 2

## 2016-08-22 MED ORDER — ASPIRIN EC 81 MG PO TBEC
81.0000 mg | DELAYED_RELEASE_TABLET | Freq: Every day | ORAL | Status: DC
  Administered 2016-08-22 – 2016-08-25 (×4): 81 mg via ORAL
  Filled 2016-08-22 (×4): qty 1

## 2016-08-22 MED ORDER — SODIUM CHLORIDE 0.9 % IV SOLN: 250.0000 mL | INTRAVENOUS | Status: DC | PRN

## 2016-08-22 MED ORDER — PANCRELIPASE (LIP-PROT-AMYL) 12000-38000 UNITS PO CPEP
12000.0000 [IU] | ORAL_CAPSULE | Freq: Three times a day (TID) | ORAL | Status: DC
  Administered 2016-08-22 – 2016-08-25 (×10): 12000 [IU] via ORAL
  Filled 2016-08-22 (×11): qty 1

## 2016-08-22 MED ORDER — PANTOPRAZOLE SODIUM 40 MG PO TBEC
40.0000 mg | DELAYED_RELEASE_TABLET | ORAL | Status: DC
  Administered 2016-08-23 – 2016-08-25 (×2): 40 mg via ORAL
  Filled 2016-08-22 (×2): qty 1

## 2016-08-22 MED ORDER — SODIUM CHLORIDE 0.9% FLUSH
3.0000 mL | Freq: Two times a day (BID) | INTRAVENOUS | Status: DC
  Administered 2016-08-22 – 2016-08-25 (×6): 3 mL via INTRAVENOUS

## 2016-08-22 MED ORDER — DEXTROSE 5 % IV SOLN
2.0000 g | Freq: Once | INTRAVENOUS | Status: AC
  Administered 2016-08-22: 2 g via INTRAVENOUS
  Filled 2016-08-22: qty 2

## 2016-08-22 MED ORDER — SENNOSIDES-DOCUSATE SODIUM 8.6-50 MG PO TABS
1.0000 | ORAL_TABLET | Freq: Every day | ORAL | Status: DC
  Administered 2016-08-23 – 2016-08-25 (×3): 1 via ORAL
  Filled 2016-08-22 (×4): qty 1

## 2016-08-22 NOTE — Progress Notes (Addendum)
[facility] PROGRESS NOTE  PLUMMER MATICH JQZ:[id number] DOB: 03/26/1920 DOA: 08/21/2016   PCP: [name], MD     Assessment/Plan: Principal Problem:   Acute on chronic respiratory failure with hypoxia ([place]) Active Problems:   Prostate cancer metastatic to multiple sites ([place])   Anemia of chronic disease   Elevated troponin   Type 2 diabetes mellitus without complication (HCC)   Acute on chronic combined systolic and diastolic congestive heart failure (HCC)   Pressure ulcer, stage III (HCC)   SOB (shortness of breath)   Guy [name] is a 82 y.o. male with medical history significant of combined CHF EF 50%, HTN, HLD, DM type II, COPD on 3 L oxygen at night, chronically elevated troponin, GERD, chronic indwelling Foley; who presents with complaints of shortness of breath.ED Course:  Upon admission into the emergency department patient was seen to be afebrile, heart rate is 58-67, respirations 12-25, blood pressure maintained, and O2 saturations 98-100% on 2-3 L of nasal cannula oxygen. Lab work revealed BNP 550.1, troponin 0.34, BUN 48, creatinine 1.19. Chest x-ray showed small left-sided pleural effusion with signs of left mid to lower lung zone opacities  Assessment and plan Acute on chronic Respiratory failure with hypoxia/ combine systolic and diastolic CHF:   Patient with BNP 550 and chest x-ray which showed sodium pleural effusion with mid to lower lung zone possible opacities. -Continued telemetry - Heart failure protocol initiated - Continue Lasix 20 mg IV BID - Strict I and O's and daily weights - Carb modified heart healthy diet D-dimer-elevated, VQ scan to rule out PE, will empirically start lovenox pending VQ  If VQ scan is negative the patient would need a CT chest  Possible pneumonia: Patient given empiric antibiotics for HCAP in ED. - Patient discharged on doxycycline for 3 days on 11/19 Currently afebrile, therefore hold off on  antibiotics  History of prostrate cancer, recent Escherichia coli UTI: Patient with chronic indwelling Foley. Previous urine culture revealed Escherichia coli. Per review of records it appears that his urine always has bacteria present therefore less likely acute complicated urinary tract infection. - Continue to monitor Family states that the patient's prostrate cancer has had a recurrence and currently he is receiving Lupron injections every 3 months  Elevated troponins: Acute on chronic.  Chronically elevated, recent 2-D echo did not show any wall motion abnormalities    Anemia of chronic disease:  hemoglobin appeared to have dropped from 10.5 down to 9.5 from previous discharge.. - Recheck CBC in a.m.  Thrombocytopenia-hold Lovenox for platelet count less than 100,000  Essential hypertension - Continue amlodipine and Metoprolol  Diabetes mellitus type 2 - Hypoglycemic protocols . Hemoglobin A1c 5.5 - Continue home Lantus 8 units subcutaneous at night - CBGs every before meals with sensitive sliding scale once   Hyperlipidemia  - Continue Zocor    Chronic kidney disease stage III: Stable. - Recheck BMP in a.m.  Prostate cancer - Continue Casodex  Pressure ulcer stage III  - Low air loss mattress   Prolonged QTc 539 - Held Zoloft - Check magnesium level   GERD - Continue pharmacy substitution of Protonix     DVT prophylaxsis Lovenox  Code Status:  DO NOT RESUSCITATE     Family Communication: Discussed in detail with the patient, all imaging results, lab results explained to the patient   Disposition Plan:  1-2 days      Consultants:  None  Procedures:  None  Antibiotics:  Anti-infectives    Start     Dose/Rate Route Frequency Ordered Stop   08/22/16 0130  aztreonam (AZACTAM) 2 g in dextrose 5 % 50 mL IVPB     2 g 100 mL/hr over 30 Minutes Intravenous  Once 08/22/16 0115 08/22/16 0225   08/22/16 0130  vancomycin (VANCOCIN) IVPB 750  mg/150 ml premix     750 mg 150 mL/hr over 60 Minutes Intravenous  Once 08/22/16 0127 08/22/16 0325         HPI/Subjective: Patient presented with sob, denies any today , no chest pain  Objective: Vitals:   08/22/16 0200 08/22/16 0230 08/22/16 0300 08/22/16 0353  BP: 137/77 130/69 125/69 (!) 151/56  Pulse: 61 (!) 58 (!) 59 60  Resp: '15 13 12 14  '$ Temp:    98 F (36.7 C)  TempSrc:    Oral  SpO2: 100% 100% 99% 100%  Weight:    42.2 kg (93 lb 1.6 oz)  Height:    '5\' 3"'$  (1.6 m)    Intake/Output Summary (Last 24 hours) at 08/22/16 7169 Last data filed at 08/22/16 0853  Gross per 24 hour  Intake              490 ml  Output             1150 ml  Net             -660 ml    Exam:  Examination:  General exam: Appears calm and comfortable  Respiratory system: Clear to auscultation. Respiratory effort normal. Cardiovascular system: S1 & S2 heard, RRR. No JVD, murmurs, rubs, gallops or clicks. No pedal edema. Gastrointestinal system: Abdomen is nondistended, soft and nontender. No organomegaly or masses felt. Normal bowel sounds heard. Central nervous system: Alert and oriented. No focal neurological deficits. Extremities: Symmetric 5 x 5 power. Skin: No rashes, lesions or ulcers Psychiatry: Judgement and insight appear normal. Mood & affect appropriate.     Data Reviewed: I have personally reviewed following labs and imaging studies  Micro Results Recent Results (from the past 240 hour(s))  Culture, blood (routine x 2) Call MD if unable to obtain prior to antibiotics being given     Status: None   Collection Time: 08/15/16  7:19 AM  Result Value Ref Range Status   Specimen Description BLOOD RIGHT ANTECUBITAL  Final   Special Requests   Final    BOTTLES DRAWN AEROBIC AND ANAEROBIC 10CC AER 5CC ANA   Culture NO GROWTH 5 DAYS  Final   Report Status 08/20/2016 FINAL  Final  Culture, blood (routine x 2) Call MD if unable to obtain prior to antibiotics being given     Status:  None   Collection Time: 08/15/16  7:28 AM  Result Value Ref Range Status   Specimen Description BLOOD RIGHT HAND  Final   Special Requests BOTTLES DRAWN AEROBIC ONLY 5CC  Final   Culture NO GROWTH 5 DAYS  Final   Report Status 08/20/2016 FINAL  Final  Culture, Urine     Status: Abnormal   Collection Time: 08/17/16  8:26 AM  Result Value Ref Range Status   Specimen Description URINE, RANDOM  Final   Special Requests NONE  Final   Culture >=100,000 COLONIES/mL ESCHERICHIA COLI (A)  Final   Report Status 08/19/2016 FINAL  Final   Organism ID, Bacteria ESCHERICHIA COLI (A)  Final      Susceptibility   Escherichia coli - MIC*    AMPICILLIN 16 INTERMEDIATE Intermediate  CEFAZOLIN <=4 SENSITIVE Sensitive     CEFTRIAXONE <=1 SENSITIVE Sensitive     CIPROFLOXACIN >=4 RESISTANT Resistant     GENTAMICIN <=1 SENSITIVE Sensitive     IMIPENEM <=0.25 SENSITIVE Sensitive     NITROFURANTOIN <=16 SENSITIVE Sensitive     TRIMETH/SULFA >=320 RESISTANT Resistant     AMPICILLIN/SULBACTAM 4 SENSITIVE Sensitive     PIP/TAZO 8 SENSITIVE Sensitive     Extended ESBL NEGATIVE Sensitive     * >=100,000 COLONIES/mL ESCHERICHIA COLI    Radiology Reports Dg Chest Portable 1 View  Result Date: 08/21/2016 CLINICAL DATA:  82 y/o  M; shortness of breath. EXAM: PORTABLE CHEST 1 VIEW COMPARISON:  08/15/2016 chest radiograph FINDINGS: Small left pleural effusion. Left mid and lower lung zone opacities may represent associated atelectasis or pneumonia. Clear right lung. Stable cardiac silhouette. Aortic atherosclerosis with arch calcification. Lower thoracic kyphoplasty changes. Cholecystectomy clips in right upper quadrant. Fracture deformity of right proximal humerus. IMPRESSION: Small left pleural effusion and left mid and lower lung zone opacities which may represent associated atelectasis or pneumonia. Electronically Signed   By: Kristine Garbe M.D.   On: 08/21/2016 22:37   Dg Chest Port 1  View  Result Date: 08/15/2016 CLINICAL DATA:  Acute onset of shortness of breath. Initial encounter. EXAM: PORTABLE CHEST 1 VIEW COMPARISON:  Chest radiograph performed 05/20/2015 FINDINGS: The lungs are relatively well-aerated. Bilateral central airspace opacification raises concern for multifocal pneumonia, though interstitial edema might have a similar appearance. A small left pleural effusion is suspected. No pneumothorax is seen, though the lung apices are partially obscured by the patient's head. The cardiomediastinal silhouette is borderline normal in size. No acute osseous abnormalities are identified. IMPRESSION: Bilateral central airspace opacification raises concern for multifocal pneumonia, though interstitial edema might have a similar appearance. Small left pleural effusion suspected. Electronically Signed   By: Garald Balding M.D.   On: 08/15/2016 03:07     CBC  Recent Labs Lab 08/22/16 0028 08/22/16 0431  WBC 7.3 5.7  HGB 9.5* 9.5*  HCT 28.8* 29.2*  PLT 130* 101*  MCV 100.7* 101.0*  MCH 33.2 32.9  MCHC 33.0 32.5  RDW 15.6* 15.7*  LYMPHSABS 1.2 1.0  MONOABS 0.6 0.6  EOSABS 0.5 0.6  BASOSABS 0.1 0.0    Chemistries   Recent Labs Lab 08/16/16 0331 08/17/16 0541 08/18/16 0323 08/21/16 2255 08/22/16 0431  NA 135 139 138 140  --   K 3.9 3.4* 3.6 3.8  --   CL 100* 101 102 107  --   CO2 '29 30 29 26  '$ --   GLUCOSE 232* 101* 103* 239*  --   BUN 37* 46* 50* 48*  --   CREATININE 1.28* 1.37* 1.30* 1.19  --   CALCIUM 8.8* 8.9 9.0 8.7*  --   MG  --   --   --   --  1.9   ------------------------------------------------------------------------------------------------------------------ estimated creatinine clearance is 23.1 mL/min (by C-G formula based on SCr of 1.19 mg/dL). ------------------------------------------------------------------------------------------------------------------ No results for input(s): HGBA1C in the last 72  hours. ------------------------------------------------------------------------------------------------------------------ No results for input(s): CHOL, HDL, LDLCALC, TRIG, CHOLHDL, LDLDIRECT in the last 72 hours. ------------------------------------------------------------------------------------------------------------------ No results for input(s): TSH, T4TOTAL, T3FREE, THYROIDAB in the last 72 hours.  Invalid input(s): FREET3 ------------------------------------------------------------------------------------------------------------------ No results for input(s): VITAMINB12, FOLATE, FERRITIN, TIBC, IRON, RETICCTPCT in the last 72 hours.  Coagulation profile No results for input(s): INR, PROTIME in the last 168 hours.  No results for input(s): DDIMER in the last 72 hours.  Cardiac Enzymes  Recent Labs Lab 08/15/16 1631 08/21/16 2255 08/22/16 0431  TROPONINI 0.12* 0.34* 0.15*   ------------------------------------------------------------------------------------------------------------------ Invalid input(s): POCBNP   CBG:  Recent Labs Lab 08/17/16 1635 08/17/16 2234 08/18/16 0703 08/18/16 1153 08/22/16 0640  GLUCAP 184* 118* 106* 223* 194*       Studies: Dg Chest Portable 1 View  Result Date: 08/21/2016 CLINICAL DATA:  82 y/o  M; shortness of breath. EXAM: PORTABLE CHEST 1 VIEW COMPARISON:  08/15/2016 chest radiograph FINDINGS: Small left pleural effusion. Left mid and lower lung zone opacities may represent associated atelectasis or pneumonia. Clear right lung. Stable cardiac silhouette. Aortic atherosclerosis with arch calcification. Lower thoracic kyphoplasty changes. Cholecystectomy clips in right upper quadrant. Fracture deformity of right proximal humerus. IMPRESSION: Small left pleural effusion and left mid and lower lung zone opacities which may represent associated atelectasis or pneumonia. Electronically Signed   By: Kristine Garbe M.D.   On:  08/21/2016 22:37      Lab Results  Component Value Date   HGBA1C 5.5 08/15/2016   HGBA1C 7.8 (H) 04/03/2014   HGBA1C 7.4 (H) 03/21/2013   Lab Results  Component Value Date   LDLCALC 45 08/15/2016   CREATININE 1.19 08/21/2016       Scheduled Meds: . amLODipine  2.5 mg Oral Daily  . aspirin EC  81 mg Oral Daily  . bicalutamide  50 mg Oral Daily  . cycloSPORINE  1 drop Both Eyes BID  . feeding supplement (PRO-STAT SUGAR FREE 64)  30 mL Oral Daily  . furosemide  20 mg Intravenous BID  . gabapentin  100 mg Oral TID  . insulin aspart  0-9 Units Subcutaneous TID WC  . insulin glargine  8 Units Subcutaneous QHS  . latanoprost  1 drop Right Eye QHS  . lipase/protease/amylase  12,000 Units Oral TID WC  . metoprolol succinate  12.5 mg Oral Daily  . [START ON 08/23/2016] pantoprazole  40 mg Oral QODAY  . senna-docusate  1 tablet Oral QHS  . [START ON 08/23/2016] simvastatin  10 mg Oral QODAY  . sodium chloride flush  3 mL Intravenous Q12H   Continuous Infusions:   LOS: 0 days    Time spent: >30 MINS    [facility]  [facility] Pager [phone]. If 7PM-7AM, please contact night-coverage at [email or web address], password [facility] 08/22/2016, 9:27 AM  LOS: 0 days

## 2016-08-22 NOTE — H&P (Signed)
History and Physical    Guy [name]:[id number] DOB: April 05, 1920 DOA: 08/21/2016  Referring MD/NP/PA: Dalia Heading PA-C  PCP: [name], MD  Patient coming from:  Home  Chief Complaint: Shortness of breath  HPI: Guy [name] is a 82 y.o. 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 Symptoms acutely occurred while patient was lying in bed. There is note of patient complaining of a cough. Patient does not complain of fever, chills, headache, chest pain, lower extremity edema, nausea, vomiting, or diaphoresis. Patient was just recently hospitalized from 11/15 through 11/19 for shortness of breath found to have acute on chronic respiratory failure with hypoxia with unclear etiology question CHF versus possible CAP.  ED Course:  Upon admission into the emergency department patient was seen to be afebrile, heart rate is 58-67, respirations 12-25, blood pressure maintained, and O2 saturations 98-100% on 2-3 L of nasal cannula oxygen. Lab work revealed BNP 550.1, troponin 0.34, BUN 48, creatinine 1.19. Chest x-ray showed small left-sided pleural effusion with signs of left mid to lower lung zone opacities.  Review of Systems: As per HPI otherwise 10 point review of systems negative.   Past Medical History:  Diagnosis Date  . Arthritis   . Bladder cancer ([place])   . CAD (coronary artery disease) 1996   stent to the LAD   . COPD (chronic obstructive pulmonary disease) ([place])   . Decreased libido   . Depression   . Diabetes mellitus without complication ([place])   . ED (erectile dysfunction)   . Ejection fraction   . Gastric polyposis   . GERD (gastroesophageal reflux disease)   . Hepatitis A   . History of blood transfusion    1946  . Hyperlipidemia   . Hyperlipidemia   . Hypertension   . OA  (osteoarthritis)   . Osteoporosis   . Prostate cancer ([place])    S/P prostatectomy; Lung metastasis  . Sciatica   . Stroke ([place])   . Type 2 diabetes mellitus ([place])   . Ulcer of left heel ([place])   . Urge incontinence   . Vertigo     Past Surgical History:  Procedure Laterality Date  . APPENDECTOMY    . BLADDER SURGERY    . CARDIAC CATHETERIZATION  11/27/2001   patent stent with mild in-stent restenosis.  . CARPAL TUNNEL RELEASE    . CATARACT EXTRACTION W/ INTRAOCULAR LENS  IMPLANT, BILATERAL  1998  . CORONARY ANGIOPLASTY  1996   stenting to the LAD in [place].   [name] FINGER SURGERY     Left and right  . HERNIA REPAIR    . Left hip surgery     Donated bone for bone graft to arm  . MIDDLE EAR SURGERY    . myoview  06/12/2009   Persantine myoview EF 76%; Normal myoview  . PENILE PROSTHESIS IMPLANT     S/P removal and re-implantation of new prosthesis  . PROSTATECTOMY    . Right arm bone graft     Pathological fracture  . URINARY SPHINCTER IMPLANT    . Urinary sphincter implant revision       reports that he has quit smoking. He has never used smokeless tobacco. He reports that he does not drink alcohol or use drugs.  Allergies  Allergen Reactions  . Albuterol Sulfate Hfa [Kdc:Albuterol]  Shortness Of Breath and Swelling  . Adhesive [Tape] Other (See Comments)    REACTION: SKIN BLISTERS  . Albuterol Swelling    Per pt., side of his neck began to swell with nebulized Albuterol in doctor's office.  . Lactose Other (See Comments)    Other reaction(s): GI Upset (intolerance)  . Penicillins Itching and Other (See Comments)    REACTION: ITCHING HANDS Has patient had a PCN reaction causing immediate rash, facial/tongue/throat swelling, SOB or lightheadedness with hypotension: No Has patient had a PCN reaction causing severe rash involving mucus membranes or skin necrosis: No Has patient had a PCN reaction that required hospitalization Yes Has patient had a PCN reaction occurring  within the last 10 years: No If all of the above answers are "NO", then may proceed with Cephalosporin use.   . Sulfa Drugs Cross Reactors Hives  . Budesonide-Formoterol Fumarate Rash  . Contrast Media [Iodinated Diagnostic Agents] Other (See Comments)    Reaction unknown  . Ioxaglate Other (See Comments)    Reaction unknown  . Metrizamide Other (See Comments)    Reaction unknown  . Sulfamethoxazole Rash    Family History  Problem Relation Age of Onset  . Heart failure Mother   . Heart disease Mother   . Bladder Cancer Father   . Pancreatic cancer Sister   . Lung cancer Sister   . Breast cancer Daughter   . Liver disease Son     Prior to Admission medications   Medication Sig Start Date End Date Taking? Authorizing Provider  Amino Acids-Protein Hydrolys (FEEDING SUPPLEMENT, PRO-STAT SUGAR FREE 64,) LIQD Take 30 mLs by mouth daily. 08/19/16  Yes Eber Jones, MD  amLODipine (NORVASC) 2.5 MG tablet Take 1 tablet (2.5 mg total) by mouth daily. 08/19/16  Yes Eber Jones, MD  aspirin 81 MG tablet Take 81 mg by mouth daily. For heart theraphy   Yes Historical Provider, MD  bicalutamide (CASODEX) 50 MG tablet Take 50 mg by mouth daily.   Yes Historical Provider, MD  Calcium Carbonate-Vitamin D (CALCIUM 600+D HIGH POTENCY) 600-400 MG-UNIT per tablet Take 1 tablet by mouth 2 (two) times daily.     Yes Historical Provider, MD  CRANBERRY CONCENTRATE PO Take 1 capsule by mouth 3 (three) times daily.    Yes Historical Provider, MD  cycloSPORINE (RESTASIS) 0.05 % ophthalmic emulsion Place 1 drop into both eyes 2 (two) times daily. 08/18/16  Yes Eber Jones, MD  doxycycline (VIBRA-TABS) 100 MG tablet Take 1 tablet (100 mg total) by mouth every 12 (twelve) hours. 08/18/16 08/22/16 Yes Eber Jones, MD  gabapentin (NEURONTIN) 100 MG capsule Take 100 mg by mouth 3 (three) times daily.  02/24/15  Yes Historical Provider, MD  insulin glargine (LANTUS) 100 UNIT/ML  injection Inject 0.08 mLs (8 Units total) into the skin at bedtime. 08/18/16  Yes Eber Jones, MD  latanoprost (XALATAN) 0.005 % ophthalmic solution Place 1 drop into the right eye at bedtime. 08/18/16  Yes Eber Jones, MD  metoprolol succinate (TOPROL-XL) 25 MG 24 hr tablet Take 0.5 tablets (12.5 mg total) by mouth daily. 08/19/16  Yes Eber Jones, MD  Multiple Vitamins-Minerals (HAIR/SKIN/NAILS) TABS Take 1 tablet by mouth 3 (three) times daily.   Yes Historical Provider, MD  Multiple Vitamins-Minerals (MULTIVITAMINS THER. W/MINERALS) TABS Take 1 tablet by mouth every morning.    Yes Historical Provider, MD  nitroGLYCERIN (NITROSTAT) 0.4 MG SL tablet Place 0.4 mg under the tongue every 5 (five) minutes  as needed for chest pain.   Yes Historical Provider, MD  omeprazole (PRILOSEC) 20 MG capsule Take 20 mg by mouth every other day. For GERD   Yes Historical Provider, MD  Pancrelipase, Lip-Prot-Amyl, 5000 units CPEP Take 5,000 Units by mouth 3 (three) times daily.   Yes Historical Provider, MD  senna-docusate (SENOKOT S) 8.6-50 MG per tablet Take 1 tablet by mouth at bedtime. For constipation 05/24/15  Yes Ripudeep Krystal Eaton, MD  simvastatin (ZOCOR) 20 MG tablet Take 10 mg by mouth every other day. Take 1/2 tablet =10 mg for HLD.   Yes Historical Provider, MD  minocycline (MINOCIN,DYNACIN) 100 MG capsule Take 1 capsule (100 mg total) by mouth 2 (two) times daily. 08/22/16   Eber Jones, MD    Physical Exam:  Constitutional: Elderly frail cachectic male who is chronically ill-appearing. NAD, calm, comfortable Vitals:   08/22/16 0000 08/22/16 0200 08/22/16 0230 08/22/16 0300  BP: 153/65 137/77 130/69 125/69  Pulse: 64 61 (!) 58 (!) 59  Resp: '22 15 13 12  '$ Temp:      TempSrc:      SpO2: 100% 100% 100% 99%  Weight:      Height:       Eyes: PERRL, lids and conjunctivae normal ENMT: Mucous membranes are moist. Posterior pharynx clear of any exudate or  lesions. Neck: normal, supple, no masses, no thyromegaly, +JVD Respiratory: Barrel-shaped chest parents with decreased overall air movement. No wheezes appreciated. Cardiovascular: Distant heart sounds that are bradycardic in nature. Abdomen: no tenderness, no masses palpated. No hepatosplenomegaly. Bowel sounds positive.  Musculoskeletal: no clubbing / cyanosis. No joint deformity upper and lower extremities. Good ROM, no contractures. Generalized muscle atrophy Skin: Pressure ulcer present of sacrum Neurologic: CN 2-12 grossly intact. Sensation intact, DTR normal. Strength 4+/5 in all 4.  Psychiatric: Alert, but nonverbal. Patient will not his head when answering questions. Normal mood.     Labs on Admission: I have personally reviewed following labs and imaging studies  CBC:  Recent Labs Lab 08/22/16 0028  WBC 7.3  NEUTROABS 4.8  HGB 9.5*  HCT 28.8*  MCV 100.7*  PLT 024*   Basic Metabolic Panel:  Recent Labs Lab 08/16/16 0331 08/17/16 0541 08/18/16 0323 08/21/16 2255  NA 135 139 138 140  K 3.9 3.4* 3.6 3.8  CL 100* 101 102 107  CO2 '29 30 29 26  '$ GLUCOSE 232* 101* 103* 239*  BUN 37* 46* 50* 48*  CREATININE 1.28* 1.37* 1.30* 1.19  CALCIUM 8.8* 8.9 9.0 8.7*   GFR: Estimated Creatinine Clearance: 24.4 mL/min (by C-G formula based on SCr of 1.19 mg/dL). Liver Function Tests: No results for input(s): AST, ALT, ALKPHOS, BILITOT, PROT, ALBUMIN in the last 168 hours. No results for input(s): LIPASE, AMYLASE in the last 168 hours. No results for input(s): AMMONIA in the last 168 hours. Coagulation Profile: No results for input(s): INR, PROTIME in the last 168 hours. Cardiac Enzymes:  Recent Labs Lab 08/15/16 0718 08/15/16 1631 08/21/16 2255  TROPONINI 0.12* 0.12* 0.34*   BNP (last 3 results) No results for input(s): PROBNP in the last 8760 hours. HbA1C: No results for input(s): HGBA1C in the last 72 hours. CBG:  Recent Labs Lab 08/17/16 1135 08/17/16 1635  08/17/16 2234 08/18/16 0703 08/18/16 1153  GLUCAP 147* 184* 118* 106* 223*   Lipid Profile: No results for input(s): CHOL, HDL, LDLCALC, TRIG, CHOLHDL, LDLDIRECT in the last 72 hours. Thyroid Function Tests: No results for input(s): TSH, T4TOTAL, FREET4, T3FREE, THYROIDAB  in the last 72 hours. Anemia Panel: No results for input(s): VITAMINB12, FOLATE, FERRITIN, TIBC, IRON, RETICCTPCT in the last 72 hours. Urine analysis:    Component Value Date/Time   COLORURINE YELLOW 08/21/2016 2340   APPEARANCEUR CLOUDY (A) 08/21/2016 2340   LABSPEC 1.019 08/21/2016 2340   PHURINE 5.5 08/21/2016 2340   GLUCOSEU NEGATIVE 08/21/2016 2340   HGBUR MODERATE (A) 08/21/2016 2340   BILIRUBINUR NEGATIVE 08/21/2016 2340   KETONESUR NEGATIVE 08/21/2016 2340   PROTEINUR 30 (A) 08/21/2016 2340   UROBILINOGEN 0.2 07/06/2015 1606   NITRITE NEGATIVE 08/21/2016 2340   LEUKOCYTESUR MODERATE (A) 08/21/2016 2340   Sepsis Labs: Recent Results (from the past 240 hour(s))  Culture, blood (routine x 2) Call MD if unable to obtain prior to antibiotics being given     Status: None   Collection Time: 08/15/16  7:19 AM  Result Value Ref Range Status   Specimen Description BLOOD RIGHT ANTECUBITAL  Final   Special Requests   Final    BOTTLES DRAWN AEROBIC AND ANAEROBIC 10CC AER 5CC ANA   Culture NO GROWTH 5 DAYS  Final   Report Status 08/20/2016 FINAL  Final  Culture, blood (routine x 2) Call MD if unable to obtain prior to antibiotics being given     Status: None   Collection Time: 08/15/16  7:28 AM  Result Value Ref Range Status   Specimen Description BLOOD RIGHT HAND  Final   Special Requests BOTTLES DRAWN AEROBIC ONLY 5CC  Final   Culture NO GROWTH 5 DAYS  Final   Report Status 08/20/2016 FINAL  Final  Culture, Urine     Status: Abnormal   Collection Time: 08/17/16  8:26 AM  Result Value Ref Range Status   Specimen Description URINE, RANDOM  Final   Special Requests NONE  Final   Culture >=100,000  COLONIES/mL ESCHERICHIA COLI (A)  Final   Report Status 08/19/2016 FINAL  Final   Organism ID, Bacteria ESCHERICHIA COLI (A)  Final      Susceptibility   Escherichia coli - MIC*    AMPICILLIN 16 INTERMEDIATE Intermediate     CEFAZOLIN <=4 SENSITIVE Sensitive     CEFTRIAXONE <=1 SENSITIVE Sensitive     CIPROFLOXACIN >=4 RESISTANT Resistant     GENTAMICIN <=1 SENSITIVE Sensitive     IMIPENEM <=0.25 SENSITIVE Sensitive     NITROFURANTOIN <=16 SENSITIVE Sensitive     TRIMETH/SULFA >=320 RESISTANT Resistant     AMPICILLIN/SULBACTAM 4 SENSITIVE Sensitive     PIP/TAZO 8 SENSITIVE Sensitive     Extended ESBL NEGATIVE Sensitive     * >=100,000 COLONIES/mL ESCHERICHIA COLI     Radiological Exams on Admission: Dg Chest Portable 1 View  Result Date: 08/21/2016 CLINICAL DATA:  82 y/o  M; shortness of breath. EXAM: PORTABLE CHEST 1 VIEW COMPARISON:  08/15/2016 chest radiograph FINDINGS: Small left pleural effusion. Left mid and lower lung zone opacities may represent associated atelectasis or pneumonia. Clear right lung. Stable cardiac silhouette. Aortic atherosclerosis with arch calcification. Lower thoracic kyphoplasty changes. Cholecystectomy clips in right upper quadrant. Fracture deformity of right proximal humerus. IMPRESSION: Small left pleural effusion and left mid and lower lung zone opacities which may represent associated atelectasis or pneumonia. Electronically Signed   By: Kristine Garbe M.D.   On: 08/21/2016 22:37    EKG: Independently reviewed.    Assessment/Plan  Pespiratory failure with hypoxia/ combine systolic and diastolic CHF: Acute on chronic. Patient with BNP 550 and chest x-ray which showed sodium pleural effusion with  mid to lower lung zone possible opacities. - Admit to Telemetry bed - Heart failure protocol initiated - Lasix 20 mg IV BID - Strict I and O's and daily weights - Carb modified heart healthy diet - Follow-up telemetry overnight   Possible  pneumonia: Patient given empiric antibiotics for HCAP in ED. - Determine if antibiotics need to be continued  Suspect asymptomatic bacteremia: Patient with chronic indwelling Foley. Previous urine culture revealed Escherichia coli. Per review of records it appears that his urine always has bacteria present therefore less likely acute complicated urinary tract infection. - Continue to monitor  Elevated troponins: Acute on chronic.  -Trend troponins   Anemia of chronic disease:  hemoglobin appeared to have dropped from 10.5 down to 9.5 from previous discharge.. - Recheck CBC in a.m.  Essential hypertension - Continue amlodipine and Metoprolol  Diabetes mellitus type 2 - Hypoglycemic protocols  - Continue home Lantus 8 units subcutaneous at night - CBGs every before meals with sensitive sliding scale once   Hyperlipidemia  - Continue Zocor    Chronic kidney disease stage III: Stable. - Recheck BMP in a.m.  Prostate cancer - Continue Casodex  Pressure ulcer stage III  - Low air loss mattress   Prolonged QTc 539 - Held Zoloft - Check magnesium level   GERD - Continue pharmacy substitution of Protonix  DVT prophylaxis: SCDs Code Status: DNR Family Communication:  no family present at bedside  Disposition Plan: Likely discharge home was medically stable  Consults called: None Admission status:   Norval Morton MD [facility] Pager [phone]  If 7PM-7AM, please contact night-coverage [email or web address] Password [email or web address]  08/22/2016, 3:16 AM

## 2016-08-22 NOTE — Progress Notes (Addendum)
ANTICOAGULATION CONSULT NOTE - Initial Consult  Pharmacy Consult for Enoxaparin Indication: VTE treatment  Allergies  Allergen Reactions  . Albuterol Sulfate Hfa [Kdc:Albuterol] Shortness Of Breath and Swelling  . Adhesive [Tape] Other (See Comments)    REACTION: SKIN BLISTERS  . Albuterol Swelling    Per pt., side of his neck began to swell with nebulized Albuterol in doctor's office.  . Lactose Other (See Comments)    Other reaction(s): GI Upset (intolerance)  . Penicillins Itching and Other (See Comments)    REACTION: ITCHING HANDS Has patient had a PCN reaction causing immediate rash, facial/tongue/throat swelling, SOB or lightheadedness with hypotension: No Has patient had a PCN reaction causing severe rash involving mucus membranes or skin necrosis: No Has patient had a PCN reaction that required hospitalization Yes Has patient had a PCN reaction occurring within the last 10 years: No If all of the above answers are "NO", then may proceed with Cephalosporin use.   . Sulfa Drugs Cross Reactors Hives  . Budesonide-Formoterol Fumarate Rash  . Contrast Media [Iodinated Diagnostic Agents] Other (See Comments)    Reaction unknown  . Ioxaglate Other (See Comments)    Reaction unknown  . Metrizamide Other (See Comments)    Reaction unknown  . Sulfamethoxazole Rash    Patient Measurements: Height: '5\' 3"'$  (160 cm) Weight: 93 lb 1.6 oz (42.2 kg) IBW/kg (Calculated) : 56.9  Vital Signs: Temp: 97.8 F (36.6 C) (11/23 1225) Temp Source: Oral (11/23 1225) BP: 133/53 (11/23 1225) Pulse Rate: 68 (11/23 1225)  Labs:  Recent Labs  08/21/16 2255 08/22/16 0028 08/22/16 0431 08/22/16 0923  HGB  --  9.5* 9.5*  --   HCT  --  28.8* 29.2*  --   PLT  --  130* 101*  --   CREATININE 1.19  --   --   --   TROPONINI 0.34*  --  0.15* 0.14*    Estimated Creatinine Clearance: 23.1 mL/min (by C-G formula based on SCr of 1.19 mg/dL).   Medical History: Past Medical History:   Diagnosis Date  . Arthritis   . Bladder cancer ([place])   . CAD (coronary artery disease) 1996   stent to the LAD   . COPD (chronic obstructive pulmonary disease) ([place])   . Decreased libido   . Depression   . Diabetes mellitus without complication ([place])   . ED (erectile dysfunction)   . Ejection fraction   . Gastric polyposis   . GERD (gastroesophageal reflux disease)   . Hepatitis A   . History of blood transfusion    1946  . Hyperlipidemia   . Hyperlipidemia   . Hypertension   . OA (osteoarthritis)   . Osteoporosis   . Prostate cancer ([place])    S/P prostatectomy; Lung metastasis  . Sciatica   . Stroke ([employer])   . Type 2 diabetes mellitus ([place])   . Ulcer of left heel ([place])   . Urge incontinence   . Vertigo     Assessment: 89 yom admitted with SOB. Pharmacy consulted to dose Lovenox for r/o PE - V/Q scan pending. No anticoagulation PTA. Hg 9.5 stable, plt down to 101. No bleed documented. CrCl~23, weight 42.2 kg.  Goal of Therapy:  Anti-Xa level 0.6-1 units/ml 4hrs after LMWH dose given Monitor platelets by anticoagulation protocol: Yes   Plan:  Lovenox '40mg'$  (~'1mg'$ /kg) [place] q24h Monitor CBC q72h, s/sx bleeding  Elicia Lamp, PharmD, BCPS Clinical Pharmacist 08/22/2016 2:57 PM

## 2016-08-22 NOTE — ED Provider Notes (Signed)
[facility] DEPT Provider Note   CSN: [id number] Arrival date & time: 08/21/16  2103     History   Chief Complaint Chief Complaint  Patient presents with  . Shortness of Breath    HPI Guy [name] is a 82 y.o. male.  HPI Patient presents to the emergency department with a brief episode of shortness of breath that occurred earlier this evening.  The patient's wife states that he started complaining of shortness of breath and she called the medical aide that helps her and he advised to call EMS.  Patient is feeling better at this time.  Wife states that nothing seems make the condition better or worse.The patient denies chest pain, headache,blurred vision, neck pain, fever, cough, weakness, numbness, dizziness, anorexia, edema, abdominal pain, nausea, vomiting, diarrhea, rash, back pain, dysuria, hematemesis, bloody stool, near syncope, or syncope. Past Medical History:  Diagnosis Date  . Arthritis   . Bladder cancer ([place])   . CAD (coronary artery disease) 1996   stent to the LAD   . COPD (chronic obstructive pulmonary disease) ([place])   . Decreased libido   . Depression   . Diabetes mellitus without complication ([place])   . ED (erectile dysfunction)   . Ejection fraction   . Gastric polyposis   . GERD (gastroesophageal reflux disease)   . Hepatitis A   . History of blood transfusion    1946  . Hyperlipidemia   . Hyperlipidemia   . Hypertension   . OA (osteoarthritis)   . Osteoporosis   . Prostate cancer ([place])    S/P prostatectomy; Lung metastasis  . Sciatica   . Stroke ([place])   . Type 2 diabetes mellitus ([place])   . Ulcer of left heel ([place])   . Urge incontinence   . Vertigo     Patient Active Problem List   Diagnosis Date Noted  . Acute on chronic respiratory failure with hypoxia ([place]) 08/15/2016  . Acute on chronic combined systolic and diastolic congestive heart failure ([place]) 08/15/2016  . CHF exacerbation ([place]) 08/15/2016  . Pressure ulcer, stage III ([place])  08/15/2016  . CAP (community acquired pneumonia) 08/15/2016  . Protein-calorie malnutrition, severe ([place]) 05/23/2015  . Proctitis 05/21/2015  . Ulcer of left heel ([place]) 03/14/2015  . Yeast infection 04/22/2014  . Type 2 diabetes mellitus without complication ([place]) 76/73/4193  . Coronary artery disease involving native coronary artery of native heart without angina pectoris 04/13/2014  . Hypokalemia 04/08/2014  . Generalized weakness 04/08/2014  . GERD (gastroesophageal reflux disease) 04/08/2014  . Depression 04/08/2014  . Allergic rhinitis 04/08/2014  . RBBB 04/04/2014  . Malnutrition of moderate degree ([place]) 04/04/2014  . Hypoglycemia 04/04/2014  . Acute kidney injury ([place]) 04/04/2014  . Thrombocytopenia, unspecified 04/03/2014  . DIC (disseminated intravascular coagulation) ([place]) 04/03/2014  . Elevated troponin 04/03/2014  . Prolonged Q-T interval on ECG 04/03/2014  . Ejection fraction   . Sepsis ([place]) 04/02/2014  . UTI (lower urinary tract infection) 04/02/2014  . Chest pain 01/28/2014  . Pain 12/31/2013  . Lumbar transverse process fracture ([place]) 12/29/2013  . Back pain 12/29/2013  . Fall 12/29/2013  . Thoracic compression fracture ([place]) 12/29/2013  . Anemia of other chronic disease 04/29/2013  . Urinary incontinence 04/25/2013  . CAD (coronary artery disease) 02/18/2013  . Acute venous embolism and thrombosis of deep vessels of proximal lower extremity ([place]) 02/18/2013  . Hyperlipidemia   . Essential hypertension, benign 12/28/2012  . Prostate cancer metastatic to multiple sites ([place]) 11/16/2012  .  Dehydration 11/16/2012  . Leukocytosis 11/16/2012    Past Surgical History:  Procedure Laterality Date  . APPENDECTOMY    . BLADDER SURGERY    . CARDIAC CATHETERIZATION  11/27/2001   patent stent with mild in-stent restenosis.  . CARPAL TUNNEL RELEASE    . CATARACT EXTRACTION W/ INTRAOCULAR LENS  IMPLANT, BILATERAL  1998  . CORONARY ANGIOPLASTY  1996   stenting to the  LAD in [place].   [name] FINGER SURGERY     Left and right  . HERNIA REPAIR    . Left hip surgery     Donated bone for bone graft to arm  . MIDDLE EAR SURGERY    . myoview  06/12/2009   Persantine myoview EF 76%; Normal myoview  . PENILE PROSTHESIS IMPLANT     S/P removal and re-implantation of new prosthesis  . PROSTATECTOMY    . Right arm bone graft     Pathological fracture  . URINARY SPHINCTER IMPLANT    . Urinary sphincter implant revision         Home Medications    Prior to Admission medications   Medication Sig Start Date End Date Taking? Authorizing Provider  Amino Acids-Protein Hydrolys (FEEDING SUPPLEMENT, PRO-STAT SUGAR FREE 64,) LIQD Take 30 mLs by mouth daily. 08/19/16  Yes Eber Jones, MD  amLODipine (NORVASC) 2.5 MG tablet Take 1 tablet (2.5 mg total) by mouth daily. 08/19/16  Yes Eber Jones, MD  aspirin 81 MG tablet Take 81 mg by mouth daily. For heart theraphy   Yes Historical Provider, MD  bicalutamide (CASODEX) 50 MG tablet Take 50 mg by mouth daily.   Yes Historical Provider, MD  Calcium Carbonate-Vitamin D (CALCIUM 600+D HIGH POTENCY) 600-400 MG-UNIT per tablet Take 1 tablet by mouth 2 (two) times daily.     Yes Historical Provider, MD  CRANBERRY CONCENTRATE PO Take 1 capsule by mouth 3 (three) times daily.    Yes Historical Provider, MD  cycloSPORINE (RESTASIS) 0.05 % ophthalmic emulsion Place 1 drop into both eyes 2 (two) times daily. 08/18/16  Yes Eber Jones, MD  doxycycline (VIBRA-TABS) 100 MG tablet Take 1 tablet (100 mg total) by mouth every 12 (twelve) hours. 08/18/16 08/22/16 Yes Eber Jones, MD  gabapentin (NEURONTIN) 100 MG capsule Take 100 mg by mouth 3 (three) times daily.  02/24/15  Yes Historical Provider, MD  insulin glargine (LANTUS) 100 UNIT/ML injection Inject 0.08 mLs (8 Units total) into the skin at bedtime. 08/18/16  Yes Eber Jones, MD  latanoprost (XALATAN) 0.005 % ophthalmic solution Place 1  drop into the right eye at bedtime. 08/18/16  Yes Eber Jones, MD  metoprolol succinate (TOPROL-XL) 25 MG 24 hr tablet Take 0.5 tablets (12.5 mg total) by mouth daily. 08/19/16  Yes Eber Jones, MD  Multiple Vitamins-Minerals (HAIR/SKIN/NAILS) TABS Take 1 tablet by mouth 3 (three) times daily.   Yes Historical Provider, MD  Multiple Vitamins-Minerals (MULTIVITAMINS THER. W/MINERALS) TABS Take 1 tablet by mouth every morning.    Yes Historical Provider, MD  nitroGLYCERIN (NITROSTAT) 0.4 MG SL tablet Place 0.4 mg under the tongue every 5 (five) minutes as needed for chest pain.   Yes Historical Provider, MD  omeprazole (PRILOSEC) 20 MG capsule Take 20 mg by mouth every other day. For GERD   Yes Historical Provider, MD  Pancrelipase, Lip-Prot-Amyl, 5000 units CPEP Take 5,000 Units by mouth 3 (three) times daily.   Yes Historical Provider, MD  senna-docusate (SENOKOT S) 8.6-50 MG  per tablet Take 1 tablet by mouth at bedtime. For constipation 05/24/15  Yes Ripudeep Krystal Eaton, MD  simvastatin (ZOCOR) 20 MG tablet Take 10 mg by mouth every other day. Take 1/2 tablet =10 mg for HLD.   Yes Historical Provider, MD  minocycline (MINOCIN,DYNACIN) 100 MG capsule Take 1 capsule (100 mg total) by mouth 2 (two) times daily. 08/22/16   Eber Jones, MD    Family History Family History  Problem Relation Age of Onset  . Heart failure Mother   . Heart disease Mother   . Bladder Cancer Father   . Pancreatic cancer Sister   . Lung cancer Sister   . Breast cancer Daughter   . Liver disease Son     Social History Social History  Substance Use Topics  . Smoking status: Former Research scientist (life sciences)  . Smokeless tobacco: Never Used  . Alcohol use No     Allergies   Albuterol sulfate hfa [kdc:albuterol]; Adhesive [tape]; Albuterol; Lactose; Penicillins; Sulfa drugs cross reactors; Budesonide-formoterol fumarate; Contrast media [iodinated diagnostic agents]; Ioxaglate; Metrizamide; and  Sulfamethoxazole   Review of Systems Review of Systems All other systems negative except as documented in the HPI. All pertinent positives and negatives as reviewed in the HPI.  Physical Exam Updated Vital Signs BP 145/76 (BP Location: Left Arm)   Pulse 64   Temp 97.9 F (36.6 C) (Oral)   Resp 25   Ht '5\' 6"'$  (1.676 m)   Wt 44.5 kg   SpO2 100%   BMI 15.82 kg/m   Physical Exam  Constitutional: He is oriented to person, place, and time. He appears well-developed and well-nourished. No distress.  HENT:  Head: Normocephalic and atraumatic.  Mouth/Throat: Oropharynx is clear and moist.  Eyes: Pupils are equal, round, and reactive to light.  Neck: Normal range of motion. Neck supple.  Cardiovascular: Normal rate, regular rhythm and normal heart sounds.  Exam reveals no gallop and no friction rub.   No murmur heard. Pulmonary/Chest: Effort normal and breath sounds normal. No respiratory distress. He has no wheezes.  Abdominal: Soft. Bowel sounds are normal. He exhibits no distension. There is no tenderness.  Neurological: He is alert and oriented to person, place, and time. He exhibits normal muscle tone. Coordination normal.  Skin: Skin is warm and dry. No rash noted. No erythema.  Psychiatric: He has a normal mood and affect. His behavior is normal.  Nursing note and vitals reviewed.    ED Treatments / Results  Labs (all labs ordered are listed, but only abnormal results are displayed) Labs Reviewed  BRAIN NATRIURETIC PEPTIDE - Abnormal; Notable for the following:       Result Value   B Natriuretic Peptide 550.1 (*)    All other components within normal limits  URINALYSIS, ROUTINE W REFLEX MICROSCOPIC (NOT AT [facility]) - Abnormal; Notable for the following:    APPearance CLOUDY (*)    Hgb urine dipstick MODERATE (*)    Protein, ur 30 (*)    Leukocytes, UA MODERATE (*)    All other components within normal limits  CBC WITH DIFFERENTIAL/PLATELET - Abnormal; Notable for the  following:    RBC 2.86 (*)    Hemoglobin 9.5 (*)    HCT 28.8 (*)    MCV 100.7 (*)    RDW 15.6 (*)    Platelets 130 (*)    All other components within normal limits  URINE MICROSCOPIC-ADD ON - Abnormal; Notable for the following:    Squamous Epithelial / LPF 0-5 (*)  Bacteria, UA MANY (*)    All other components within normal limits  BASIC METABOLIC PANEL  CBC WITH DIFFERENTIAL/PLATELET  TROPONIN I    EKG  EKG Interpretation None       Radiology Dg Chest Portable 1 View  Result Date: 08/21/2016 CLINICAL DATA:  82 y/o  M; shortness of breath. EXAM: PORTABLE CHEST 1 VIEW COMPARISON:  08/15/2016 chest radiograph FINDINGS: Small left pleural effusion. Left mid and lower lung zone opacities may represent associated atelectasis or pneumonia. Clear right lung. Stable cardiac silhouette. Aortic atherosclerosis with arch calcification. Lower thoracic kyphoplasty changes. Cholecystectomy clips in right upper quadrant. Fracture deformity of right proximal humerus. IMPRESSION: Small left pleural effusion and left mid and lower lung zone opacities which may represent associated atelectasis or pneumonia. Electronically Signed   By: Kristine Garbe M.D.   On: 08/21/2016 22:37    Procedures Procedures (including critical care time)  Medications Ordered in ED Medications - No data to display   Initial Impression / Assessment and Plan / ED Course  I have reviewed the triage vital signs and the nursing notes.  Pertinent labs & imaging results that were available during my care of the patient were reviewed by me and considered in my medical decision making (see chart for details).  Clinical Course     A shunt need admission for multiple issues.  He has a questionable pneumonia and elevated troponin and a drop in his hemoglobin.   Final Clinical Impressions(s) / ED Diagnoses   Final diagnoses:  None    New Prescriptions New Prescriptions   No medications on file      Dalia Heading, PA-C 08/22/16 0119    Margette Fast, MD 08/22/16 1011

## 2016-08-23 ENCOUNTER — Inpatient Hospital Stay (HOSPITAL_COMMUNITY): Payer: Medicare Other

## 2016-08-23 DIAGNOSIS — J189 Pneumonia, unspecified organism: Secondary | ICD-10-CM

## 2016-08-23 LAB — CBC
HEMATOCRIT: 28.3 % — AB (ref 39.0–52.0)
HEMOGLOBIN: 9.3 g/dL — AB (ref 13.0–17.0)
MCH: 33.2 pg (ref 26.0–34.0)
MCHC: 32.9 g/dL (ref 30.0–36.0)
MCV: 101.1 fL — ABNORMAL HIGH (ref 78.0–100.0)
Platelets: 122 10*3/uL — ABNORMAL LOW (ref 150–400)
RBC: 2.8 MIL/uL — AB (ref 4.22–5.81)
RDW: 15.8 % — ABNORMAL HIGH (ref 11.5–15.5)
WBC: 6.2 10*3/uL (ref 4.0–10.5)

## 2016-08-23 LAB — GLUCOSE, CAPILLARY
GLUCOSE-CAPILLARY: 91 mg/dL (ref 65–99)
GLUCOSE-CAPILLARY: 128 mg/dL — AB (ref 65–99)
Glucose-Capillary: 161 mg/dL — ABNORMAL HIGH (ref 65–99)
Glucose-Capillary: 158 mg/dL — ABNORMAL HIGH (ref 65–99)

## 2016-08-23 LAB — COMPREHENSIVE METABOLIC PANEL
ALBUMIN: 2 g/dL — AB (ref 3.5–5.0)
ALK PHOS: 132 U/L — AB (ref 38–126)
ALT: 32 U/L (ref 17–63)
ANION GAP: 8 (ref 5–15)
AST: 48 U/L — ABNORMAL HIGH (ref 15–41)
BILIRUBIN TOTAL: 0.4 mg/dL (ref 0.3–1.2)
BUN: 44 mg/dL — ABNORMAL HIGH (ref 6–20)
CALCIUM: 8.7 mg/dL — AB (ref 8.9–10.3)
CO2: 30 mmol/L (ref 22–32)
CREATININE: 1.29 mg/dL — AB (ref 0.61–1.24)
Chloride: 101 mmol/L (ref 101–111)
GFR calc Af Amer: 53 mL/min — ABNORMAL LOW (ref >60)
GFR calc non Af Amer: 46 mL/min — ABNORMAL LOW (ref >60)
GLUCOSE: 164 mg/dL — AB (ref 65–99)
Potassium: 3.3 mmol/L — ABNORMAL LOW (ref 3.5–5.1)
Sodium: 139 mmol/L (ref 135–145)
TOTAL PROTEIN: 6.9 g/dL (ref 6.5–8.1)

## 2016-08-23 LAB — MRSA PCR SCREENING: MRSA by PCR: POSITIVE — AB

## 2016-08-23 MED ORDER — CHLORHEXIDINE GLUCONATE CLOTH 2 % EX PADS
6.0000 | MEDICATED_PAD | Freq: Every day | CUTANEOUS | Status: DC
  Administered 2016-08-25: 6 via TOPICAL

## 2016-08-23 MED ORDER — CHLORHEXIDINE GLUCONATE CLOTH 2 % EX PADS: 6.0000 | MEDICATED_PAD | Freq: Every day | CUTANEOUS | Status: DC

## 2016-08-23 MED ORDER — FUROSEMIDE 20 MG PO TABS
20.0000 mg | ORAL_TABLET | Freq: Two times a day (BID) | ORAL | Status: DC
  Administered 2016-08-23 – 2016-08-25 (×5): 20 mg via ORAL
  Filled 2016-08-23 (×5): qty 1

## 2016-08-23 MED ORDER — POTASSIUM CHLORIDE CRYS ER 20 MEQ PO TBCR
20.0000 meq | EXTENDED_RELEASE_TABLET | Freq: Two times a day (BID) | ORAL | Status: DC
  Administered 2016-08-23 – 2016-08-25 (×6): 20 meq via ORAL
  Filled 2016-08-23 (×6): qty 1

## 2016-08-23 MED ORDER — MUPIROCIN 2 % EX OINT
1.0000 "application " | TOPICAL_OINTMENT | Freq: Two times a day (BID) | CUTANEOUS | Status: DC
  Administered 2016-08-23 – 2016-08-25 (×6): 1 via NASAL
  Filled 2016-08-23: qty 22

## 2016-08-23 MED ORDER — DEXTROSE 5 % IV SOLN
500.0000 mg | INTRAVENOUS | Status: DC
  Administered 2016-08-23 – 2016-08-25 (×3): 500 mg via INTRAVENOUS
  Filled 2016-08-23 (×3): qty 0.5

## 2016-08-23 MED ORDER — TECHNETIUM TO 99M ALBUMIN AGGREGATED
4.0000 | Freq: Once | INTRAVENOUS | Status: AC | PRN
  Administered 2016-08-23: 4 via INTRAVENOUS

## 2016-08-23 MED ORDER — MUPIROCIN 2 % EX OINT: 1.0000 "application " | TOPICAL_OINTMENT | Freq: Two times a day (BID) | CUTANEOUS | Status: DC

## 2016-08-23 MED ORDER — TECHNETIUM TC 99M DIETHYLENETRIAME-PENTAACETIC ACID: 32.0000 | Freq: Once | INTRAVENOUS | Status: DC | PRN

## 2016-08-23 NOTE — Progress Notes (Signed)
Pharmacy Antibiotic Note  Guy [name] is a 82 y.o. male admitted on 08/21/2016 with pneumonia.  Pharmacy has been consulted for cefepime dosing.  Patient afebrile, wbc normal. CT chest suggest aspiration pneumonia.  Original orders for zosyn, with pcn allergy discussed with hospitalist and will change to cefepime which he has tolerated.   Plan: After discussion with Dr. Allyson Sabal, antibiotics will be changed cefepime given pcn allergy.  Cefepime '500mg'$  q24 hours  Height: '5\' 3"'$  (160 cm) Weight: 87 lb 11.2 oz (39.8 kg) IBW/kg (Calculated) : 56.9  Temp (24hrs), Avg:97.6 F (36.4 C), Min:97.3 F (36.3 C), Max:97.8 F (36.6 C)   Recent Labs Lab 08/17/16 0541 08/18/16 0323 08/21/16 2255 08/22/16 0028 08/22/16 0431 08/23/16 0335  WBC  --   --   --  7.3 5.7 6.2  CREATININE 1.37* 1.30* 1.19  --   --  1.29*    Estimated Creatinine Clearance: 20.1 mL/min (by C-G formula based on SCr of 1.29 mg/dL (H)).    Allergies  Allergen Reactions  . Albuterol Sulfate Hfa [Kdc:Albuterol] Shortness Of Breath and Swelling  . Adhesive [Tape] Other (See Comments)    REACTION: SKIN BLISTERS  . Albuterol Swelling    Per pt., side of his neck began to swell with nebulized Albuterol in doctor's office.  . Lactose Other (See Comments)    Other reaction(s): GI Upset (intolerance)  . Penicillins Itching and Other (See Comments)    REACTION: ITCHING HANDS Has patient had a PCN reaction causing immediate rash, facial/tongue/throat swelling, SOB or lightheadedness with hypotension: No Has patient had a PCN reaction causing severe rash involving mucus membranes or skin necrosis: No Has patient had a PCN reaction that required hospitalization Yes Has patient had a PCN reaction occurring within the last 10 years: No If all of the above answers are "NO", then may proceed with Cephalosporin use.   . Sulfa Drugs Cross Reactors Hives  . Budesonide-Formoterol Fumarate Rash  . Contrast Media [Iodinated  Diagnostic Agents] Other (See Comments)    Reaction unknown  . Ioxaglate Other (See Comments)    Reaction unknown  . Metrizamide Other (See Comments)    Reaction unknown  . Sulfamethoxazole Rash    Antimicrobials this admission: Cefepime 11/24>>   Dose adjustments this admission: n/a  Microbiology results: n/a Thank you for allowing pharmacy to be a part of this patient's care.  Erin Hearing PharmD., BCPS Clinical Pharmacist Pager [phone] 08/23/2016 9:29 AM

## 2016-08-23 NOTE — Evaluation (Signed)
Occupational Therapy Evaluation Patient Details Name: Guy [name] MRN: [id number] DOB: 06-13-1920 Today's Date: 08/23/2016    History of Present Illness Pt readmitted with Acute on Chronic Respiratory Failure.  pt with hx of CHF, HTN, DM, COPD, Depression, CVA, Prostate CA with Mets, Bladder CA, Indwelling Catheter, CAD, Hep A, L Heel Ulcer, and Vertigo.     Clinical Impression   Pt admitted with above. He demonstrates the below listed deficits and will benefit from continued OT to maximize safety and independence with BADLs.  Pt has hired caregiver assist at home.  They assist him with ADLs, but he is able to assist with functional transfers.  OT will follow him acutely to increase independence with transfers, improve activity tolerance, and prevent secondary complications of immobility       Follow Up Recommendations  No OT follow up;Supervision/Assistance - 24 hour    Equipment Recommendations  None recommended by OT    Recommendations for Other Services       Precautions / Restrictions Precautions Precautions: Fall Precaution Comments: pt is very HOH. Restrictions Weight Bearing Restrictions: No      Mobility Bed Mobility Overal bed mobility: Needs Assistance Bed Mobility: Supine to Sit;Sit to Supine     Supine to sit: Min assist Sit to supine: Max assist   General bed mobility comments: Pt required assist to initiate moving LEs off bed and to lift shoulders.  He requires assist to lift LEs back into bed and to control descent of trunk   Transfers Overall transfer level: Needs assistance Equipment used: 1 person hand held assist Transfers: Sit to/from Stand Sit to Stand: Mod assist         General transfer comment: Pt requires assist to lift hips and for hip extension     Balance Overall balance assessment: Needs assistance Sitting-balance support: Feet supported Sitting balance-Leahy Scale: Poor Sitting balance - Comments: sat EOB x 10 mins with  min guard assist  - min A  Postural control: Posterior lean Standing balance support: Bilateral upper extremity supported Standing balance-Leahy Scale: Poor Standing balance comment: requires mod A                             ADL Overall ADL's : Needs assistance/impaired                         Toilet Transfer: Moderate assistance;Stand-pivot;BSC;Maximal assistance             General ADL Comments: Pt is at baseline with all other ADL tasks - he has assistance from aide     Vision     Perception     Praxis      Pertinent Vitals/Pain Pain Assessment: Faces Faces Pain Scale: No hurt     Hand Dominance Right   Extremity/Trunk Assessment Upper Extremity Assessment Upper Extremity Assessment: RUE deficits/detail;Generalized weakness RUE Deficits / Details: limited shoulder ROM due to prior injury    Lower Extremity Assessment Lower Extremity Assessment: Defer to PT evaluation RLE Deficits / Details: Strength <3/5 LLE Deficits / Details: Strength <3/5   Cervical / Trunk Assessment Cervical / Trunk Assessment: Kyphotic   Communication Communication Communication: HOH   Cognition Arousal/Alertness: Awake/alert Behavior During Therapy: WFL for tasks assessed/performed Overall Cognitive Status: Difficult to assess                     General Comments  Exercises       Shoulder Instructions      Home Living Family/patient expects to be discharged to:: Private residence Living Arrangements: Spouse/significant other Available Help at Discharge: Family;Personal care attendant;Available 24 hours/[name] Type of Home: House Home Access: Ramped entrance     Home Layout: One level     Bathroom Shower/Tub: Occupational psychologist: Standard     Home Equipment: Environmental consultant - 2 wheels;Bedside commode;Shower seat;Hand held shower head   Additional Comments: aides do all bathing and dressing, toileting and help walk and  exercise pt      Prior Functioning/Environment Level of Independence: Needs assistance  Gait / Transfers Assistance Needed: walks with hands held with aide, not as a means of mobility ADL's / Homemaking Assistance Needed: aides put him in the shower, self feeds with supervision Communication / Swallowing Assistance Needed: [place] with aides Comments: per prior encounter of 11/17        OT Problem List: Decreased strength;Decreased activity tolerance;Decreased range of motion;Impaired balance (sitting and/or standing);Decreased knowledge of use of DME or AE   OT Treatment/Interventions: Self-care/ADL training;DME and/or AE instruction;Therapeutic activities;Balance training;Patient/family education    OT Goals(Current goals can be found in the care plan section) Acute Rehab OT Goals Patient Stated Goal: return home (wife) OT Goal Formulation: Patient unable to participate in goal setting Time For Goal Achievement: 09/06/16 Potential to Achieve Goals: Fair ADL Goals Pt Will Transfer to Toilet: with mod assist;ambulating;regular height toilet;stand pivot transfer;bedside commode;grab bars  OT Frequency: Min 2X/week   Barriers to D/C:            Co-evaluation              End of Session Nurse Communication: Mobility status  Activity Tolerance: Patient tolerated treatment well Patient left: in bed;with call bell/phone within reach;with bed alarm set   Time: 2641-5830 OT Time Calculation (min): 18 min Charges:  OT General Charges $OT Visit: 1 Procedure OT Evaluation $OT Eval Moderate Complexity: 1 Procedure G-Codes:    Corrine Tillis, Ellard Artis M Sep 22, 2016, 5:27 PM

## 2016-08-23 NOTE — Evaluation (Signed)
Physical Therapy Evaluation Patient Details Name: Guy [name] MRN: [id number] DOB: 1920/05/26 Today's Date: 08/23/2016   History of Present Illness  Pt readmitted with Acute on Chronic Respiratory Failure.  pt with hx of CHF, HTN, DM, COPD, Depression, CVA, Prostate CA with Mets, Bladder CA, Indwelling Catheter, CAD, Hep A, L Heel Ulcer, and Vertigo.    Clinical Impression  Pt admitted with above diagnosis and presents to PT with functional limitations due to deficits listed below (See PT problem list). Pt needs skilled PT to maximize independence and safety to allow discharge to home with wife and hired caregiver. Pt very debilitated with very little reserves.      Follow Up Recommendations Home health PT;Supervision/Assistance - 24 hour    Equipment Recommendations  None recommended by PT    Recommendations for Other Services       Precautions / Restrictions Precautions Precautions: Fall Restrictions Weight Bearing Restrictions: No      Mobility  Bed Mobility Overal bed mobility: Needs Assistance Bed Mobility: Supine to Sit;Sit to Supine     Supine to sit: Total assist Sit to supine: Total assist   General bed mobility comments: Assist with all aspects  Transfers Overall transfer level: Needs assistance Equipment used: Ambulation equipment used Transfers: Sit to/from Stand Sit to Stand: Total assist         General transfer comment: Assist using bed pad to bring hips and trunk up  Ambulation/Gait             General Gait Details: Unable  Stairs            Wheelchair Mobility    Modified Rankin (Stroke Patients Only)       Balance Overall balance assessment: Needs assistance Sitting-balance support: Bilateral upper extremity supported Sitting balance-Leahy Scale: Poor Sitting balance - Comments: Sat EOB x 10 minutes with min A to min guard Postural control: Posterior lean Standing balance support: Bilateral upper extremity  supported Standing balance-Leahy Scale: Zero                               Pertinent Vitals/Pain Pain Assessment: No/denies pain    Home Living Family/patient expects to be discharged to:: Private residence Living Arrangements: Spouse/significant other Available Help at Discharge: Family;Personal care attendant;Available 24 hours/day Type of Home: House Home Access: Ramped entrance     Home Layout: One level Home Equipment: Walker - 2 wheels;Bedside commode;Shower seat;Hand held shower head Additional Comments: aides do all bathing and dressing, toileting and help walk and exercise pt    Prior Function Level of Independence: Needs assistance   Gait / Transfers Assistance Needed: walks with hands held with aide, not as a means of mobility  ADL's / Homemaking Assistance Needed: aides put him in the shower, self feeds with supervision  Comments: per prior encounter of 11/17     Hand Dominance   Dominant Hand: Right    Extremity/Trunk Assessment   Upper Extremity Assessment: Defer to OT evaluation           Lower Extremity Assessment: RLE deficits/detail;LLE deficits/detail RLE Deficits / Details: Strength <3/5 LLE Deficits / Details: Strength <3/5  Cervical / Trunk Assessment: Kyphotic  Communication   Communication: HOH  Cognition Arousal/Alertness: Awake/alert Behavior During Therapy: WFL for tasks assessed/performed Overall Cognitive Status: Difficult to assess                      General  Comments      Exercises     Assessment/Plan    PT Assessment Patient needs continued PT services  PT Problem List Decreased strength;Decreased activity tolerance;Decreased balance;Decreased mobility          PT Treatment Interventions DME instruction;Gait training;Functional mobility training;Therapeutic activities;Therapeutic exercise;Balance training;Patient/family education    PT Goals (Current goals can be found in the Care Plan section)   Acute Rehab PT Goals Patient Stated Goal: return home (wife) PT Goal Formulation: With family Time For Goal Achievement: 09/06/16 Potential to Achieve Goals: Fair    Frequency Min 2X/week   Barriers to discharge        Co-evaluation               End of Session Equipment Utilized During Treatment: Oxygen Activity Tolerance: Patient limited by fatigue Patient left: in bed;with call bell/phone within reach;with bed alarm set;with family/visitor present Nurse Communication: Mobility status         Time: 4975-3005 PT Time Calculation (min) (ACUTE ONLY): 21 min   Charges:   PT Evaluation $PT Eval Moderate Complexity: 1 Procedure     PT G Codes[facility]ary [facility] 16-Sep-2016, 4:27 PM [employer] PT [phone]

## 2016-08-23 NOTE — Progress Notes (Addendum)
[facility] PROGRESS NOTE  Guy [name]:[id number] DOB: [birth year] DOA: 08/21/2016   PCP: [name], MD     Assessment/Plan: Principal Problem:   Acute on chronic respiratory failure with hypoxia ([place]) Active Problems:   Prostate cancer metastatic to multiple sites ([place])   Anemia of chronic disease   Elevated troponin   Type 2 diabetes mellitus without complication (HCC)   Acute on chronic combined systolic and diastolic congestive heart failure (HCC)   Pressure ulcer, stage III (HCC)   SOB (shortness of breath)   Guy [name] is a 82 y.o. male with medical history significant of combined CHF EF 50%, HTN, HLD, DM type II, COPD on 3 L oxygen at night, chronically elevated troponin, GERD, chronic indwelling Foley; who presents with complaints of shortness of breath.ED Course:  Upon admission into the emergency department patient was seen to be afebrile, heart rate is 58-67, respirations 12-25, blood pressure maintained, and O2 saturations 98-100% on 2-3 L of nasal cannula oxygen. Lab work revealed BNP 550.1, troponin 0.34, BUN 48, creatinine 1.19. Chest x-ray showed small left-sided pleural effusion with signs of left mid to lower lung zone opacities  Assessment and plan Acute on chronic Respiratory failure with hypoxia/ MILD combined systolic and diastolic CHF:   Patient with BNP 550 and chest x-ray which showed sodium pleural effusion with mid to lower lung zone possible opacities. Patient had CT chest in May 2017 which showed left lower lobe opacity, concerning for possible aspiration pneumonia CT scan 11/23 showed bibasilar opacity, greater in the left lower lobe. No evidence of CHF Change Lasix to by mouth - Strict I and O's and daily weights - Carb modified heart healthy diet D-dimer-elevated, VQ scan negative for  PE, cont lovenox for DVT prophylaxis   would resume abx for complete treatment of pneumonia    Probable recurrent aspiration  pneumonia: Patient given empiric antibiotics for HCAP in ED. - Patient discharged on doxycycline for 3 days on 11/19 Continues to have left lower lobe opacity, will treat with cefepime/oral antibiotic for another 5 days   History of prostrate cancer, recent Escherichia coli UTI: Patient with chronic indwelling Foley changed over on a regular basis at [facility]. Previous urine culture revealed Escherichia coli. Per review of records it appears that his urine always has bacteria present therefore less likely acute complicated urinary tract infection. Family states that the patient's prostrate cancer has had a recurrence and currently he is receiving Lupron injections every 6 months   Elevated troponins: Acute on chronic.  Chronically elevated, recent 2-D echo did not show any wall motion abnormalities    Anemia of chronic disease:  hemoglobin appeared to have dropped from 10.5 down to 9.5 from previous discharge.. - Recheck CBC in a.m.  Thrombocytopenia-hold Lovenox for platelet count less than 100,000  Essential hypertension - Continue amlodipine and Metoprolol  Diabetes mellitus type 2 - Hypoglycemic protocols . Hemoglobin A1c 5.5 - Continue home Lantus 8 units subcutaneous at night - CBGs every before meals with sensitive sliding scale once   Hyperlipidemia  - Continue Zocor    Chronic kidney disease stage III: Stable. - Recheck BMP in a.m.  Prostate cancer - Continue Casodex  Pressure ulcer stage III  - Low air loss mattress   Prolonged QTc 539 - Held Zoloft - Check magnesium level   GERD - Continue pharmacy substitution of Protonix   Palliative care discussion-patient is a DO NOT RESUSCITATE CT scan in [facility]  [facility] shows concern for chronic aspiration Patient has severe protein calorie malnutrition recurrent hospitalization Has had a steady decline, evaluate patient for hospice eligibility  DVT prophylaxsis Lovenox  Code Status:  DO NOT RESUSCITATE      Family Communication: Discussed in detail with the patient, all imaging results, lab results explained to the patient   Disposition Plan:  1-2 days      Consultants:  None  Procedures:  None  Antibiotics: Anti-infectives    Start     Dose/Rate Route Frequency Ordered Stop   08/22/16 0130  aztreonam (AZACTAM) 2 g in dextrose 5 % 50 mL IVPB     2 g 100 mL/hr over 30 Minutes Intravenous  Once 08/22/16 0115 08/22/16 0225   08/22/16 0130  vancomycin (VANCOCIN) IVPB 750 mg/150 ml premix     750 mg 150 mL/hr over 60 Minutes Intravenous  Once 08/22/16 0127 08/22/16 0325         HPI/Subjective:  sob improved overnight   Objective: Vitals:   08/22/16 0353 08/22/16 1225 08/22/16 2108 08/23/16 0629  BP: (!) 151/56 (!) 133/53 127/65 (!) 116/54  Pulse: 60 68 70 64  Resp: '14 18 18 18  '$ Temp: 98 F (36.7 C) 97.8 F (36.6 C) 97.8 F (36.6 C) 97.3 F (36.3 C)  TempSrc: Oral Oral Oral Oral  SpO2: 100% 100% 100% 100%  Weight: 42.2 kg (93 lb 1.6 oz)   39.8 kg (87 lb 11.2 oz)  Height: '5\' 3"'$  (1.6 m)       Intake/Output Summary (Last 24 hours) at 08/23/16 6440 Last data filed at 08/23/16 0700  Gross per 24 hour  Intake              240 ml  Output             1375 ml  Net            -1135 ml    Exam:  Examination:  General exam: Appears calm and comfortable  Respiratory system: Clear to auscultation. Respiratory effort normal. Cardiovascular system: S1 & S2 heard, RRR. No JVD, murmurs, rubs, gallops or clicks. No pedal edema. Gastrointestinal system: Abdomen is nondistended, soft and nontender. No organomegaly or masses felt. Normal bowel sounds heard. Central nervous system: Alert and oriented. No focal neurological deficits. Extremities: Symmetric 5 x 5 power. Skin: No rashes, lesions or ulcers Psychiatry: Judgement and insight appear normal. Mood & affect appropriate.     Data Reviewed: I have personally reviewed following labs and imaging studies  Micro  Results Recent Results (from the past 240 hour(s))  Culture, blood (routine x 2) Call MD if unable to obtain prior to antibiotics being given     Status: None   Collection Time: 08/15/16  7:19 AM  Result Value Ref Range Status   Specimen Description BLOOD RIGHT ANTECUBITAL  Final   Special Requests   Final    BOTTLES DRAWN AEROBIC AND ANAEROBIC 10CC AER 5CC ANA   Culture NO GROWTH 5 DAYS  Final   Report Status 08/20/2016 FINAL  Final  Culture, blood (routine x 2) Call MD if unable to obtain prior to antibiotics being given     Status: None   Collection Time: 08/15/16  7:28 AM  Result Value Ref Range Status   Specimen Description BLOOD RIGHT HAND  Final   Special Requests BOTTLES DRAWN AEROBIC ONLY 5CC  Final   Culture NO GROWTH 5 DAYS  Final   Report Status 08/20/2016 FINAL  Final  Culture, Urine     Status: Abnormal   Collection Time: 08/17/16  8:26 AM  Result Value Ref Range Status   Specimen Description URINE, RANDOM  Final   Special Requests NONE  Final   Culture >=100,000 COLONIES/mL ESCHERICHIA COLI (A)  Final   Report Status 08/19/2016 FINAL  Final   Organism ID, Bacteria ESCHERICHIA COLI (A)  Final      Susceptibility   Escherichia coli - MIC*    AMPICILLIN 16 INTERMEDIATE Intermediate     CEFAZOLIN <=4 SENSITIVE Sensitive     CEFTRIAXONE <=1 SENSITIVE Sensitive     CIPROFLOXACIN >=4 RESISTANT Resistant     GENTAMICIN <=1 SENSITIVE Sensitive     IMIPENEM <=0.25 SENSITIVE Sensitive     NITROFURANTOIN <=16 SENSITIVE Sensitive     TRIMETH/SULFA >=320 RESISTANT Resistant     AMPICILLIN/SULBACTAM 4 SENSITIVE Sensitive     PIP/TAZO 8 SENSITIVE Sensitive     Extended ESBL NEGATIVE Sensitive     * >=100,000 COLONIES/mL ESCHERICHIA COLI    Radiology Reports Ct Chest Wo Contrast  Result Date: 08/22/2016 CLINICAL DATA:  Shortness of breath for the past few days EXAM: CT CHEST WITHOUT CONTRAST TECHNIQUE: Multidetector CT imaging of the chest was performed following the  standard protocol without IV contrast. COMPARISON:  Chest x-ray from yesterday FINDINGS: Cardiovascular: Borderline cardiomegaly. No pericardial effusion. Extensive atherosclerotic calcification. Aortic valvular calcification. Tortuosity of the great vessels. No acute vascular finding. Mediastinum/Nodes: Negative for adenopathy or mass. Lungs/Pleura: Airspace disease and volume loss in the left more than right lower lobes lingula. Small left pleural effusion posteriorly at the bases, with pleural thickening where contiguous with the lower lobe opacity. There is some patchy high density material within the left basilar opacity which could reflect aspirated material. These opacities have developed since 2016. Upper Abdomen: No acute finding. Cholecystectomy with common bile duct dilatation. Suspect main pancreatic duct dilatation. Musculoskeletal: Severe osteopenia. Cachexia. Remote posttraumatic deformity of the proximal right humerus and multiple bilateral ribs. There are chronic appearing compression fractures of T2, T3, T4, T5, T9, and L2. Height loss causes exaggerated thoracic kyphosis in the upper thoracic spine. The T9 fracture appears most recent, but is not acute. IMPRESSION: 1. Bibasilar opacity that is greatest in the left lower lobe. Most of the opacity is attributed atelectasis, but pneumonia could be superimposed if there are infectious symptoms. Small left pleural effusion with pleural thickening in the region of the left lower lobe opacity, if persistent round atelectasis or mass is likely. Recommend follow-up radiography 2 to 3 weeks after any treatment. 2. Question main pancreatic duct dilatation. Suggest abdominal sonography initially in this patient with elevated creatinine. 3. Marked osteopenia with numerous remote compression and rib fractures. Electronically Signed   By: Monte Fantasia M.D.   On: 08/22/2016 15:21   Dg Chest Portable 1 View  Result Date: 08/21/2016 CLINICAL DATA:  82 y/o   M; shortness of breath. EXAM: PORTABLE CHEST 1 VIEW COMPARISON:  08/15/2016 chest radiograph FINDINGS: Small left pleural effusion. Left mid and lower lung zone opacities may represent associated atelectasis or pneumonia. Clear right lung. Stable cardiac silhouette. Aortic atherosclerosis with arch calcification. Lower thoracic kyphoplasty changes. Cholecystectomy clips in right upper quadrant. Fracture deformity of right proximal humerus. IMPRESSION: Small left pleural effusion and left mid and lower lung zone opacities which may represent associated atelectasis or pneumonia. Electronically Signed   By: Kristine Garbe M.D.   On: 08/21/2016 22:37   Dg Chest Port 1 View  Result Date:  08/15/2016 CLINICAL DATA:  Acute onset of shortness of breath. Initial encounter. EXAM: PORTABLE CHEST 1 VIEW COMPARISON:  Chest radiograph performed 05/20/2015 FINDINGS: The lungs are relatively well-aerated. Bilateral central airspace opacification raises concern for multifocal pneumonia, though interstitial edema might have a similar appearance. A small left pleural effusion is suspected. No pneumothorax is seen, though the lung apices are partially obscured by the patient's head. The cardiomediastinal silhouette is borderline normal in size. No acute osseous abnormalities are identified. IMPRESSION: Bilateral central airspace opacification raises concern for multifocal pneumonia, though interstitial edema might have a similar appearance. Small left pleural effusion suspected. Electronically Signed   By: Garald Balding M.D.   On: 08/15/2016 03:07     CBC  Recent Labs Lab 08/22/16 0028 08/22/16 0431 08/23/16 0335  WBC 7.3 5.7 6.2  HGB 9.5* 9.5* 9.3*  HCT 28.8* 29.2* 28.3*  PLT 130* 101* 122*  MCV 100.7* 101.0* 101.1*  MCH 33.2 32.9 33.2  MCHC 33.0 32.5 32.9  RDW 15.6* 15.7* 15.8*  LYMPHSABS 1.2 1.0  --   MONOABS 0.6 0.6  --   EOSABS 0.5 0.6  --   BASOSABS 0.1 0.0  --     Chemistries   Recent  Labs Lab 08/17/16 0541 08/18/16 0323 08/21/16 2255 08/22/16 0431 08/23/16 0335  NA 139 138 140  --  139  K 3.4* 3.6 3.8  --  3.3*  CL 101 102 107  --  101  CO2 '30 29 26  '$ --  30  GLUCOSE 101* 103* 239*  --  164*  BUN 46* 50* 48*  --  44*  CREATININE 1.37* 1.30* 1.19  --  1.29*  CALCIUM 8.9 9.0 8.7*  --  8.7*  MG  --   --   --  1.9  --   AST  --   --   --   --  48*  ALT  --   --   --   --  32  ALKPHOS  --   --   --   --  132*  BILITOT  --   --   --   --  0.4   ------------------------------------------------------------------------------------------------------------------ estimated creatinine clearance is 20.1 mL/min (by C-G formula based on SCr of 1.29 mg/dL (H)). ------------------------------------------------------------------------------------------------------------------ No results for input(s): HGBA1C in the last 72 hours. ------------------------------------------------------------------------------------------------------------------ No results for input(s): CHOL, HDL, LDLCALC, TRIG, CHOLHDL, LDLDIRECT in the last 72 hours. ------------------------------------------------------------------------------------------------------------------ No results for input(s): TSH, T4TOTAL, T3FREE, THYROIDAB in the last 72 hours.  Invalid input(s): FREET3 ------------------------------------------------------------------------------------------------------------------ No results for input(s): VITAMINB12, FOLATE, FERRITIN, TIBC, IRON, RETICCTPCT in the last 72 hours.  Coagulation profile No results for input(s): INR, PROTIME in the last 168 hours.   Recent Labs  08/22/16 0941  DDIMER 1.70*    Cardiac Enzymes  Recent Labs Lab 08/22/16 0431 08/22/16 0923 08/22/16 1606  TROPONINI 0.15* 0.14* 0.13*   ------------------------------------------------------------------------------------------------------------------ Invalid input(s): POCBNP   CBG:  Recent Labs Lab  08/22/16 0640 08/22/16 1124 08/22/16 1604 08/22/16 2103 08/23/16 0627  GLUCAP 194* 214* 187* 136* 161*       Studies: Ct Chest Wo Contrast  Result Date: 08/22/2016 CLINICAL DATA:  Shortness of breath for the past few days EXAM: CT CHEST WITHOUT CONTRAST TECHNIQUE: Multidetector CT imaging of the chest was performed following the standard protocol without IV contrast. COMPARISON:  Chest x-ray from yesterday FINDINGS: Cardiovascular: Borderline cardiomegaly. No pericardial effusion. Extensive atherosclerotic calcification. Aortic valvular calcification. Tortuosity of the great vessels. No acute vascular finding. Mediastinum/Nodes:  Negative for adenopathy or mass. Lungs/Pleura: Airspace disease and volume loss in the left more than right lower lobes lingula. Small left pleural effusion posteriorly at the bases, with pleural thickening where contiguous with the lower lobe opacity. There is some patchy high density material within the left basilar opacity which could reflect aspirated material. These opacities have developed since 2016. Upper Abdomen: No acute finding. Cholecystectomy with common bile duct dilatation. Suspect main pancreatic duct dilatation. Musculoskeletal: Severe osteopenia. Cachexia. Remote posttraumatic deformity of the proximal right humerus and multiple bilateral ribs. There are chronic appearing compression fractures of T2, T3, T4, T5, T9, and L2. Height loss causes exaggerated thoracic kyphosis in the upper thoracic spine. The T9 fracture appears most recent, but is not acute. IMPRESSION: 1. Bibasilar opacity that is greatest in the left lower lobe. Most of the opacity is attributed atelectasis, but pneumonia could be superimposed if there are infectious symptoms. Small left pleural effusion with pleural thickening in the region of the left lower lobe opacity, if persistent round atelectasis or mass is likely. Recommend follow-up radiography 2 to 3 weeks after any treatment. 2.  Question main pancreatic duct dilatation. Suggest abdominal sonography initially in this patient with elevated creatinine. 3. Marked osteopenia with numerous remote compression and rib fractures. Electronically Signed   By: Monte Fantasia M.D.   On: 08/22/2016 15:21   Dg Chest Portable 1 View  Result Date: 08/21/2016 CLINICAL DATA:  82 y/o  M; shortness of breath. EXAM: PORTABLE CHEST 1 VIEW COMPARISON:  08/15/2016 chest radiograph FINDINGS: Small left pleural effusion. Left mid and lower lung zone opacities may represent associated atelectasis or pneumonia. Clear right lung. Stable cardiac silhouette. Aortic atherosclerosis with arch calcification. Lower thoracic kyphoplasty changes. Cholecystectomy clips in right upper quadrant. Fracture deformity of right proximal humerus. IMPRESSION: Small left pleural effusion and left mid and lower lung zone opacities which may represent associated atelectasis or pneumonia. Electronically Signed   By: Kristine Garbe M.D.   On: 08/21/2016 22:37      Lab Results  Component Value Date   HGBA1C 5.5 08/15/2016   HGBA1C 7.8 (H) 04/03/2014   HGBA1C 7.4 (H) 03/21/2013   Lab Results  Component Value Date   LDLCALC 45 08/15/2016   CREATININE 1.29 (H) 08/23/2016       Scheduled Meds: . amLODipine  2.5 mg Oral Daily  . aspirin EC  81 mg Oral Daily  . bicalutamide  50 mg Oral Daily  . cycloSPORINE  1 drop Both Eyes BID  . enoxaparin (LOVENOX) injection  40 mg Subcutaneous Q24H  . feeding supplement (PRO-STAT SUGAR FREE 64)  30 mL Oral Daily  . furosemide  20 mg Intravenous BID  . gabapentin  100 mg Oral TID  . insulin aspart  0-9 Units Subcutaneous TID WC  . insulin glargine  8 Units Subcutaneous QHS  . latanoprost  1 drop Right Eye QHS  . lipase/protease/amylase  12,000 Units Oral TID WC  . metoprolol succinate  12.5 mg Oral Daily  . pantoprazole  40 mg Oral QODAY  . potassium chloride  20 mEq Oral BID  . senna-docusate  1 tablet Oral QHS   . simvastatin  10 mg Oral QODAY  . sodium chloride flush  3 mL Intravenous Q12H   Continuous Infusions:   LOS: 1 day    Time spent: >30 MINS    [facility]  [facility] Pager [phone]. If 7PM-7AM, please contact night-coverage at [email or web address], password [facility] 08/23/2016, 9:24 AM  LOS: 1 day

## 2016-08-24 DIAGNOSIS — Z515 Encounter for palliative care: Secondary | ICD-10-CM

## 2016-08-24 LAB — CBC
HEMATOCRIT: 29.7 % — AB (ref 39.0–52.0)
HEMOGLOBIN: 9.7 g/dL — AB (ref 13.0–17.0)
MCH: 32.8 pg (ref 26.0–34.0)
MCHC: 32.7 g/dL (ref 30.0–36.0)
MCV: 100.3 fL — AB (ref 78.0–100.0)
Platelets: 167 10*3/uL (ref 150–400)
RBC: 2.96 MIL/uL — ABNORMAL LOW (ref 4.22–5.81)
RDW: 15.4 % (ref 11.5–15.5)
WBC: 6.9 10*3/uL (ref 4.0–10.5)

## 2016-08-24 LAB — GLUCOSE, CAPILLARY
GLUCOSE-CAPILLARY: 164 mg/dL — AB (ref 65–99)
GLUCOSE-CAPILLARY: 154 mg/dL — AB (ref 65–99)
Glucose-Capillary: 51 mg/dL — ABNORMAL LOW (ref 65–99)
Glucose-Capillary: 96 mg/dL (ref 65–99)

## 2016-08-24 MED ORDER — ENOXAPARIN SODIUM 30 MG/0.3ML ~~LOC~~ SOLN
20.0000 mg | SUBCUTANEOUS | Status: DC
  Administered 2016-08-24 – 2016-08-25 (×2): 20 mg via SUBCUTANEOUS
  Filled 2016-08-24 (×2): qty 0.2
  Filled 2016-08-24: qty 0.3

## 2016-08-24 NOTE — Progress Notes (Signed)
[place] for Enoxaparin Indication: VTE prophylaxis  Allergies  Allergen Reactions  . Albuterol Sulfate Hfa [Kdc:Albuterol] Shortness Of Breath and Swelling  . Adhesive [Tape] Other (See Comments)    REACTION: SKIN BLISTERS  . Albuterol Swelling    Per pt., side of his neck began to swell with nebulized Albuterol in doctor's office.  . Lactose Other (See Comments)    Other reaction(s): GI Upset (intolerance)  . Penicillins Itching and Other (See Comments)    REACTION: ITCHING HANDS Has patient had a PCN reaction causing immediate rash, facial/tongue/throat swelling, SOB or lightheadedness with hypotension: No Has patient had a PCN reaction causing severe rash involving mucus membranes or skin necrosis: No Has patient had a PCN reaction that required hospitalization Yes Has patient had a PCN reaction occurring within the last 10 years: No If all of the above answers are "NO", then may proceed with Cephalosporin use.   . Sulfa Drugs Cross Reactors Hives  . Budesonide-Formoterol Fumarate Rash  . Contrast Media [Iodinated Diagnostic Agents] Other (See Comments)    Reaction unknown  . Ioxaglate Other (See Comments)    Reaction unknown  . Metrizamide Other (See Comments)    Reaction unknown  . Sulfamethoxazole Rash    Patient Measurements: Height: '5\' 3"'$  (160 cm) Weight: 87 lb 12.8 oz (39.8 kg) IBW/kg (Calculated) : 56.9  Vital Signs: Temp: 97.9 F (36.6 C) (11/25 1025) Temp Source: Oral (11/25 1025) BP: 126/62 (11/25 1025) Pulse Rate: 60 (11/25 0514)  Labs:  Recent Labs  08/21/16 2255  08/22/16 0431 08/22/16 0923 08/22/16 1606 08/23/16 0335 08/24/16 0312  HGB  --   < > 9.5*  --   --  9.3* 9.7*  HCT  --   < > 29.2*  --   --  28.3* 29.7*  PLT  --   < > 101*  --   --  122* 167  CREATININE 1.19  --   --   --   --  1.29*  --   TROPONINI 0.34*  --  0.15* 0.14* 0.13*  --   --   < > = values in this interval not  displayed.  Estimated Creatinine Clearance: 20.1 mL/min (by C-G formula based on SCr of 1.29 mg/dL (H)).   Medical History: Past Medical History:  Diagnosis Date  . Arthritis   . Bladder cancer ([place])   . CAD (coronary artery disease) 1996   stent to the LAD   . COPD (chronic obstructive pulmonary disease) ([place])   . Decreased libido   . Depression   . Diabetes mellitus without complication ([place])   . ED (erectile dysfunction)   . Ejection fraction   . Gastric polyposis   . GERD (gastroesophageal reflux disease)   . Hepatitis A   . History of blood transfusion    1946  . Hyperlipidemia   . Hyperlipidemia   . Hypertension   . OA (osteoarthritis)   . Osteoporosis   . Prostate cancer ([place])    S/P prostatectomy; Lung metastasis  . Sciatica   . Stroke ([place])   . Type 2 diabetes mellitus ([place])   . Ulcer of left heel ([place])   . Urge incontinence   . Vertigo     Assessment: 89 yom admitted with SOB. Pharmacy consulted to dose Lovenox for r/o PE - V/Q scan pending. No anticoagulation PTA. Hg 9.5 stable, plt down to 101. No bleed documented. CrCl~23, weight 42.2 kg.  VQ scan is negative for PE.  Will reduce dose based on total body weight and renal function.  Goal of Therapy:  Monitor platelets by anticoagulation protocol: Yes   Plan:  Lovenox '20mg'$  [place] q24h Monitor CBC q72h, s/sx bleeding  Andrey Cota. Diona Foley, PharmD, [place] Clinical Pharmacist Pager [phone] 08/24/2016 2:31 PM

## 2016-08-24 NOTE — Evaluation (Signed)
Clinical/Bedside Swallow Evaluation Patient Details  Name: JAKARRI LESKO MRN: [id number] Date of Birth: 1920/03/08  Today's Date: 08/24/2016 Time: SLP Start Time (ACUTE ONLY): 0804 SLP Stop Time (ACUTE ONLY): 0835 SLP Time Calculation (min) (ACUTE ONLY): 31 min  Past Medical History:  Past Medical History:  Diagnosis Date  . Arthritis   . Bladder cancer ([place])   . CAD (coronary artery disease) 1996   stent to the LAD   . COPD (chronic obstructive pulmonary disease) ([place])   . Decreased libido   . Depression   . Diabetes mellitus without complication ([place])   . ED (erectile dysfunction)   . Ejection fraction   . Gastric polyposis   . GERD (gastroesophageal reflux disease)   . Hepatitis A   . History of blood transfusion    1946  . Hyperlipidemia   . Hyperlipidemia   . Hypertension   . OA (osteoarthritis)   . Osteoporosis   . Prostate cancer ([place])    S/P prostatectomy; Lung metastasis  . Sciatica   . Stroke ([place])   . Type 2 diabetes mellitus ([place])   . Ulcer of left heel ([place])   . Urge incontinence   . Vertigo    Past Surgical History:  Past Surgical History:  Procedure Laterality Date  . APPENDECTOMY    . BLADDER SURGERY    . CARDIAC CATHETERIZATION  11/27/2001   patent stent with mild in-stent restenosis.  . CARPAL TUNNEL RELEASE    . CATARACT EXTRACTION W/ INTRAOCULAR LENS  IMPLANT, BILATERAL  1998  . CORONARY ANGIOPLASTY  1996   stenting to the LAD in [place].   [name] FINGER SURGERY     Left and right  . HERNIA REPAIR    . Left hip surgery     Donated bone for bone graft to arm  . MIDDLE EAR SURGERY    . myoview  06/12/2009   Persantine myoview EF 76%; Normal myoview  . PENILE PROSTHESIS IMPLANT     S/P removal and re-implantation of new prosthesis  . PROSTATECTOMY    . Right arm bone graft     Pathological fracture  . URINARY SPHINCTER IMPLANT    . Urinary sphincter implant revision     HPI:  JYRON TURMAN is a 82 y.o. male with medical history  significant of combined CHF EF 50%, HTN, HLD, DM type II, COPD on 3 L oxygen at night, chronically elevated troponin, GERD, chronic indwelling Foley; who presents with complaints of shortness of breath. Symptoms acutely occurred while patient was lying in bed. There is note of patient complaining of a cough. Patient does not complain of fever, chills, headache, chest pain, lower extremity edema, nausea, vomiting, or diaphoresis. Patient was just recently hospitalized from 11/15 through 11/19 for shortness of breath found to have acute on chronic respiratory failure with hypoxia with unclear etiology question CHF versus possible CAP. Pt has a history of oropharyngeal dn esophageal dysphagia with regurgitation of PO. CT shows possible aspirated material, infiltrates in lung bases.    Assessment / Plan / Recommendation Clinical Impression  Pt demosntrates function consistent with reports from 2015. Pt has prolonged oral manipulation of solids and no dentition, requires soft moist foods or liquid wash to transit solids. After liquids, pt has consistent wet vocal quality indicative of oropharyngeal residuals. Likely, pt is intermittently aspirating residuals and also at risk of laryngopharyngeal reflux given esophageal dysphagia. There are no interventions that can prevent aspiration in this patient, but aspiration and esophageal  precautions are warranted to reduce risk. Pt is hard of hearing and cannot participate in education, but discussed precautions listed below with RN and posted sign at head of bed. Recommend dys 2 diet with thin liquids with ongoing risk of aspiration. Will follow for further education with staff/family as needed.     Aspiration Risk  Severe aspiration risk    Diet Recommendation Dysphagia 2 (Fine chop);Thin liquid   Liquid Administration via: Cup;No straw Medication Administration: Whole meds with puree Supervision: Staff to assist with self feeding;Full supervision/cueing for  compensatory strategies Compensations: Slow rate;Small sips/bites;Clear throat intermittently Postural Changes: Seated upright at 90 degrees;Remain upright for at least 30 minutes after po intake    Other  Recommendations Oral Care Recommendations: Oral care BID   Follow up Recommendations Skilled Nursing facility      Frequency and Duration min 2x/week  2 weeks       Prognosis Prognosis for Safe Diet Advancement: Guarded Barriers to Reach Goals: Severity of deficits      Swallow Study   General HPI: KALOB BERGEN is a 82 y.o. 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 Symptoms acutely occurred while patient was lying in bed. There is note of patient complaining of a cough. Patient does not complain of fever, chills, headache, chest pain, lower extremity edema, nausea, vomiting, or diaphoresis. Patient was just recently hospitalized from 11/15 through 11/19 for shortness of breath found to have acute on chronic respiratory failure with hypoxia with unclear etiology question CHF versus possible CAP. Pt has a history of oropahryngela dn esophageal dysphagia with regurgitation of PO. CT shows possible aspirated material, infiltrates in lung bases.  Type of Study: Bedside Swallow Evaluation Previous Swallow Assessment: see HPI Diet Prior to this Study: Regular;Thin liquids Temperature Spikes Noted: No Respiratory Status: Room air History of Recent Intubation: No Behavior/Cognition: Alert;Cooperative;Doesn't follow directions (hard of hearing) Oral Care Completed by SLP: No Oral Cavity - Dentition: Edentulous Vision: Functional for self-feeding Self-Feeding Abilities: Able to feed self;Needs assist Patient Positioning: Upright in bed Baseline Vocal Quality: Wet Volitional Cough: Weak Volitional Swallow:  Unable to elicit    Oral/Motor/Sensory Function Overall Oral Motor/Sensory Function: Within functional limits   Ice Chips Ice chips: Not tested   Thin Liquid Thin Liquid: Impaired Presentation: Cup;Self Fed Pharyngeal  Phase Impairments: Suspected delayed Swallow;Wet Vocal Quality    Nectar Thick Nectar Thick Liquid: Not tested   Honey Thick Honey Thick Liquid: Not tested   Puree Puree: Impaired Presentation: Spoon Pharyngeal Phase Impairments: Suspected delayed Swallow;Wet Vocal Quality   Solid   GO   Solid: Impaired Presentation: Spoon Oral Phase Functional Implications: Prolonged oral transit;Impaired mastication        Sophi Calligan, Katherene Ponto 08/24/2016,8:42 AM

## 2016-08-24 NOTE — Consult Note (Signed)
Consultation Note Date: 08/24/2016   Patient Name: Guy [name]  DOB: Jun 27, 1920  MRN: [id number]  Age / Sex: 82 y.o., male  PCP: Lorne Skeens, MD Referring Physician: Reyne Dumas, MD  Reason for Consultation: Establishing goals of care, Hospice Evaluation and Psychosocial/spiritual support  HPI/Patient Profile: 82 y.o. male  with past medical history of Metastatic prostate cancer with reoccurrence of disease, hypertension, hyperlipidemia, diabetes type 2, COPD on 3 L of oxygen at home at night chronically elevated troponin, GERD, chronic indwelling Foley, systolic heart failure admitted on 08/21/2016 with shortness of breath. Patient was just admitted to the hospital on 08/15/2016 with similar complaints. Recent echo shows systolic heart failure with an ejection fraction of 45-50%. Patient was admitted with a urinary tract infection, per chest CT, bi basilar opacities with marketed osteopenia with remote rib fractures and compression fracture. His troponin on admission was 0.13.[name]   Clinical Assessment and Goals of Care: Patient is a very frail elderly man. He is a World War II veteran who served in the [employer]. His wife [name] is at his side and she as well is a World War II veteran who served in the [employer]. She has seen a big functional decline.  Patient is not eating as much, his albumin is only 2.0. He's had numerous falls over the past 6 months. She now is receiving 4 hours a day thru [place] paid caregiver support to assist her with the care of her husband. She is paying for the rest of the help in the home out of pocket. She understands that her husband is trending towards end-of-life. They have 3 children together. One son is deceased, one son is living in [place], and one daughter is living in a nursing home. Her son in [place] is encouraging her to pursue hospice support in the home.  NEXT OF KIN wife  [name]    SUMMARY OF RECOMMENDATIONS   DO NOT RESUSCITATE DO NOT INTUBATE Recognizes that he has dysphagia and could aspirate Wishes to pursue comfort feeds with this many safety recommendations as possible No PEG tube Desires hospice support in the home Case manager consult placed Code Status/Advance Care Planning:  DNR    Symptom Management:   Pain: Patient is currently denying pain. Continue with Tylenol as needed and monitor for need for more aggressive pain management  Shortness of breath: Improving. Continue with targeted pulmonary treatments such as oxygen and nebulizer treatments. With hospice involvement, patient may be a candidate for opioids for severe dyspnea  Palliative Prophylaxis:   Aspiration, Bowel Regimen, Delirium Protocol, Eye Care, Frequent Pain Assessment, Oral Care and Turn Reposition  Additional Recommendations (Limitations, Scope, Preferences):  Avoid Hospitalization, Minimize Medications, Initiate Comfort Feeding, No Artificial Feeding, No Chemotherapy, No Hemodialysis, No Radiation, No Surgical Procedures and No Tracheostomy  Psycho-social/Spiritual:   Desire for further Chaplaincy support:no  Prognosis:   < 6 months in the setting of metastatic prostate cancer with recurrence of disease, worsening systolic heart failure with an ejection fraction of 50% protein calorie malnutrition with  an albumin of 2.0, current weight 87 pounds. Patient has a chronic indwelling Foley catheter and a high risk for recurrent urinary tract infections  Discharge Planning: Home with Hospice      Primary Diagnoses: Present on Admission: . SOB (shortness of breath) . Acute on chronic respiratory failure with hypoxia ([place]) . Prostate cancer metastatic to multiple sites [facility]) . Pressure ulcer, stage III ([place]) . Acute on chronic combined systolic and diastolic congestive heart failure ([place]) . Elevated troponin . Anemia of chronic disease   I have reviewed the  medical record, interviewed the patient and family, and examined the patient. The following aspects are pertinent.  Past Medical History:  Diagnosis Date  . Arthritis   . Bladder cancer ([place])   . CAD (coronary artery disease) 1996   stent to the LAD   . COPD (chronic obstructive pulmonary disease) ([place])   . Decreased libido   . Depression   . Diabetes mellitus without complication ([place])   . ED (erectile dysfunction)   . Ejection fraction   . Gastric polyposis   . GERD (gastroesophageal reflux disease)   . Hepatitis A   . History of blood transfusion    1946  . Hyperlipidemia   . Hyperlipidemia   . Hypertension   . OA (osteoarthritis)   . Osteoporosis   . Prostate cancer ([place])    S/P prostatectomy; Lung metastasis  . Sciatica   . Stroke ([place])   . Type 2 diabetes mellitus ([place])   . Ulcer of left heel ([place])   . Urge incontinence   . Vertigo    Social History   Social History  . Marital status: Married    Spouse name: [name]  . Number of children: 3  . Years of education: N/A   Occupational History  . Business owner, Press photographer, retired    Social History Main Topics  . Smoking status: Former Research scientist (life sciences)  . Smokeless tobacco: Never Used  . Alcohol use No  . Drug use: No  . Sexual activity: No   Other Topics Concern  . None   Social History Narrative   Married.  Lives with his wife.  Ambulates with a walker and a cane.   Family History  Problem Relation Age of Onset  . Heart failure Mother   . Heart disease Mother   . Bladder Cancer Father   . Pancreatic cancer Sister   . Lung cancer Sister   . Breast cancer Daughter   . Liver disease Son    Scheduled Meds: . amLODipine  2.5 mg Oral Daily  . aspirin EC  81 mg Oral Daily  . bicalutamide  50 mg Oral Daily  . ceFEPime (MAXIPIME) IV  500 mg Intravenous Q24H  . Chlorhexidine Gluconate Cloth  6 each Topical Q0600  . cycloSPORINE  1 drop Both Eyes BID  . enoxaparin (LOVENOX) injection  40 mg Subcutaneous Q24H  .  feeding supplement (PRO-STAT SUGAR FREE 64)  30 mL Oral Daily  . furosemide  20 mg Oral BID  . gabapentin  100 mg Oral TID  . insulin aspart  0-9 Units Subcutaneous TID WC  . insulin glargine  8 Units Subcutaneous QHS  . latanoprost  1 drop Right Eye QHS  . lipase/protease/amylase  12,000 Units Oral TID WC  . metoprolol succinate  12.5 mg Oral Daily  . mupirocin ointment  1 application Nasal BID  . pantoprazole  40 mg Oral QODAY  . potassium chloride  20 mEq Oral BID  .  senna-docusate  1 tablet Oral QHS  . simvastatin  10 mg Oral QODAY  . sodium chloride flush  3 mL Intravenous Q12H   Continuous Infusions: PRN Meds:.sodium chloride, acetaminophen, nitroGLYCERIN, sodium chloride flush, technetium TC 9M diethylenetriame-pentaacetic acid Medications Prior to Admission:  Prior to Admission medications   Medication Sig Start Date End Date Taking? Authorizing Provider  Amino Acids-Protein Hydrolys (FEEDING SUPPLEMENT, PRO-STAT SUGAR FREE 64,) LIQD Take 30 mLs by mouth daily. 08/19/16  Yes Eber Jones, MD  amLODipine (NORVASC) 2.5 MG tablet Take 1 tablet (2.5 mg total) by mouth daily. 08/19/16  Yes Eber Jones, MD  aspirin 81 MG tablet Take 81 mg by mouth daily. For heart theraphy   Yes Historical Provider, MD  bicalutamide (CASODEX) 50 MG tablet Take 50 mg by mouth daily.   Yes Historical Provider, MD  Calcium Carbonate-Vitamin D (CALCIUM 600+D HIGH POTENCY) 600-400 MG-UNIT per tablet Take 1 tablet by mouth 2 (two) times daily.     Yes Historical Provider, MD  CRANBERRY CONCENTRATE PO Take 1 capsule by mouth 3 (three) times daily.    Yes Historical Provider, MD  cycloSPORINE (RESTASIS) 0.05 % ophthalmic emulsion Place 1 drop into both eyes 2 (two) times daily. 08/18/16  Yes Eber Jones, MD  gabapentin (NEURONTIN) 100 MG capsule Take 100 mg by mouth 3 (three) times daily.  02/24/15  Yes Historical Provider, MD  insulin glargine (LANTUS) 100 UNIT/ML injection Inject  0.08 mLs (8 Units total) into the skin at bedtime. 08/18/16  Yes Eber Jones, MD  latanoprost (XALATAN) 0.005 % ophthalmic solution Place 1 drop into the right eye at bedtime. 08/18/16  Yes Eber Jones, MD  metoprolol succinate (TOPROL-XL) 25 MG 24 hr tablet Take 0.5 tablets (12.5 mg total) by mouth daily. 08/19/16  Yes Eber Jones, MD  Multiple Vitamins-Minerals (HAIR/SKIN/NAILS) TABS Take 1 tablet by mouth 3 (three) times daily.   Yes Historical Provider, MD  Multiple Vitamins-Minerals (MULTIVITAMINS THER. W/MINERALS) TABS Take 1 tablet by mouth every morning.    Yes Historical Provider, MD  nitroGLYCERIN (NITROSTAT) 0.4 MG SL tablet Place 0.4 mg under the tongue every 5 (five) minutes as needed for chest pain.   Yes Historical Provider, MD  omeprazole (PRILOSEC) 20 MG capsule Take 20 mg by mouth every other day. For GERD   Yes Historical Provider, MD  Pancrelipase, Lip-Prot-Amyl, 5000 units CPEP Take 5,000 Units by mouth 3 (three) times daily.   Yes Historical Provider, MD  senna-docusate (SENOKOT S) 8.6-50 MG per tablet Take 1 tablet by mouth at bedtime. For constipation 05/24/15  Yes Ripudeep Krystal Eaton, MD  simvastatin (ZOCOR) 20 MG tablet Take 10 mg by mouth every other day. Take 1/2 tablet =10 mg for HLD.   Yes Historical Provider, MD  minocycline (MINOCIN,DYNACIN) 100 MG capsule Take 1 capsule (100 mg total) by mouth 2 (two) times daily. 08/22/16   Eber Jones, MD   Allergies  Allergen Reactions  . Albuterol Sulfate Hfa [Kdc:Albuterol] Shortness Of Breath and Swelling  . Adhesive [Tape] Other (See Comments)    REACTION: SKIN BLISTERS  . Albuterol Swelling    Per pt., side of his neck began to swell with nebulized Albuterol in doctor's office.  . Lactose Other (See Comments)    Other reaction(s): GI Upset (intolerance)  . Penicillins Itching and Other (See Comments)    REACTION: ITCHING HANDS Has patient had a PCN reaction causing immediate rash,  facial/tongue/throat swelling, SOB or lightheadedness with hypotension: No  Has patient had a PCN reaction causing severe rash involving mucus membranes or skin necrosis: No Has patient had a PCN reaction that required hospitalization Yes Has patient had a PCN reaction occurring within the last 10 years: No If all of the above answers are "NO", then may proceed with Cephalosporin use.   . Sulfa Drugs Cross Reactors Hives  . Budesonide-Formoterol Fumarate Rash  . Contrast Media [Iodinated Diagnostic Agents] Other (See Comments)    Reaction unknown  . Ioxaglate Other (See Comments)    Reaction unknown  . Metrizamide Other (See Comments)    Reaction unknown  . Sulfamethoxazole Rash   Review of Systems  Unable to perform ROS: Other    Physical Exam  Constitutional: He is oriented to person, place, and time.  Cachetic frail elderly man  HENT:  Head: Normocephalic and atraumatic.  Temporal wasting  Eyes: EOM are normal. Pupils are equal, round, and reactive to light.  Neck: Normal range of motion.  Pulmonary/Chest: Effort normal.  Musculoskeletal: Normal range of motion.  Neurological: He is alert and oriented to person, place, and time.  Extremely HOH even with hearing aids in  Skin: Skin is warm and dry.  Psychiatric:  Affect constricted  Nursing note and vitals reviewed.   Vital Signs: BP 126/62 (BP Location: Right Arm)   Pulse 60   Temp 97.9 F (36.6 C) (Oral)   Resp 18   Ht '5\' 3"'$  (1.6 m)   Wt 39.8 kg (87 lb 12.8 oz)   SpO2 100%   BMI 15.55 kg/m  Pain Assessment: No/denies pain   Pain Score: 0-No pain   SpO2: SpO2: 100 % O2 Device:SpO2: 100 % O2 Flow Rate: .O2 Flow Rate (L/min): 2 L/min  IO: Intake/output summary:  Intake/Output Summary (Last 24 hours) at 08/24/16 1329 Last data filed at 08/24/16 0900  Gross per 24 hour  Intake              360 ml  Output             1250 ml  Net             -890 ml    LBM: Last BM Date: 08/22/16 Baseline Weight:  Weight: 44.5 kg (98 lb) Most recent weight: Weight: 39.8 kg (87 lb 12.8 oz)     Palliative Assessment/Data:   Flowsheet Rows   Flowsheet Row Most Recent Value  Intake Tab  Referral Department  Hospitalist  Unit at Time of Referral  Med/Surg Unit  Palliative Care Primary Diagnosis  Cardiac  Date Notified  08/23/16  Palliative Care Type  New Palliative care  Reason for referral  Counsel Regarding Hospice, Clarify Goals of Care  Date of Admission  08/21/16  Date first seen by Palliative Care  08/24/16  # of days Palliative referral response time  1 Day(s)  # of days IP prior to Palliative referral  2  Clinical Assessment  Palliative Performance Scale Score  40%  Pain Max last 24 hours  Not able to report  Pain Min Last 24 hours  Not able to report  Dyspnea Max Last 24 Hours  Not able to report  Dyspnea Min Last 24 hours  Not able to report  Nausea Max Last 24 Hours  Not able to report  Nausea Min Last 24 Hours  Not able to report  Anxiety Max Last 24 Hours  Not able to report  Anxiety Min Last 24 Hours  Not able to report  Other Max Last  24 Hours  Not able to report  Psychosocial & Spiritual Assessment  Palliative Care Outcomes      Time In: 1230 Time Out: 1315 Time Total: 75 min Greater than 50%  of this time was spent counseling and coordinating care related to the above assessment and plan.  Signed by: Dory Horn, NP   Please contact Palliative Medicine Team phone at [phone] for questions and concerns.  For individual provider: See Shea Evans

## 2016-08-24 NOTE — Progress Notes (Signed)
[facility] PROGRESS NOTE  Guy [name]:[id number] DOB: 1920/05/24 DOA: 08/21/2016   PCP: [name], MD     Assessment/Plan: Principal Problem:   Acute on chronic respiratory failure with hypoxia ([place]) Active Problems:   Prostate cancer metastatic to multiple sites ([place])   Anemia of chronic disease   Elevated troponin   Type 2 diabetes mellitus without complication (HCC)   Acute on chronic combined systolic and diastolic congestive heart failure (HCC)   Pressure ulcer, stage III (HCC)   SOB (shortness of breath)   HCAP (healthcare-associated pneumonia)   Guy [name] is a 82 y.o. male with medical history significant of combined CHF EF 50%, HTN, HLD, DM type II, COPD on 3 L oxygen at night, chronically elevated troponin, GERD, chronic indwelling Foley; who presents with complaints of shortness of breath.ED Course:  Upon admission into the emergency department patient was seen to be afebrile, heart rate is 58-67, respirations 12-25, blood pressure maintained, and O2 saturations 98-100% on 2-3 L of nasal cannula oxygen. Lab work revealed BNP 550.1, troponin 0.34, BUN 48, creatinine 1.19. Chest x-ray showed small left-sided pleural effusion with signs of left mid to lower lung zone opacities  Assessment and plan Acute on chronic Respiratory failure with hypoxia/ MILD combined systolic and diastolic CHF:   Patient with BNP 550 and chest x-ray which showed sodium pleural effusion with mid to lower lung zone possible opacities. Patient had CT chest in May 2017 which showed left lower lobe opacity, concerning for possible aspiration pneumonia CT scan 11/23 showed bibasilar opacity, greater in the left lower lobe. No evidence of CHF Continue Lasix to by mouth - Strict I and O's and daily weights - Carb modified heart healthy diet D-dimer-elevated, VQ scan negative for  PE, cont lovenox for DVT prophylaxis   would resume abx for complete treatment of pneumonia     Probable recurrent aspiration pneumonia: Patient given empiric antibiotics for HCAP in ED. - Patient discharged on doxycycline for 3 days on 11/19 Continues to have left lower lobe opacity, will treat with cefepime/oral antibiotic for another 5 days   History of prostrate cancer, recent Escherichia coli UTI: Patient with chronic indwelling Foley changed over on a regular basis at [facility]. Previous urine culture revealed Escherichia coli. Per review of records it appears that his urine always has bacteria present therefore less likely acute complicated urinary tract infection. Family states that the patient's prostrate cancer has had a recurrence and currently he is receiving Lupron injections every 6 months   Elevated troponins: Acute on chronic.  Chronically elevated, recent 2-D echo did not show any wall motion abnormalities    Anemia of chronic disease:  hemoglobin appeared to have dropped from 10.5 down to 9.5 from previous discharge.. - Recheck CBC in a.m.  Thrombocytopenia-hold Lovenox for platelet count less than 100,000  Essential hypertension - Continue amlodipine and Metoprolol  Diabetes mellitus type 2 - Hypoglycemic protocols . Hemoglobin A1c 5.5 - Continue home Lantus 8 units subcutaneous at night - CBGs every before meals with sensitive sliding scale once   Hyperlipidemia  - Continue Zocor    Chronic kidney disease stage III: Stable. - Recheck BMP in a.m.  Prostate cancer - Continue Casodex/Lupron Family is aware that the patient has lost a significant amount of weight Patient's wife interested in hospice at home, case management consultation has been placed  Pressure ulcer stage III  - Low air loss mattress  WOC  Prolonged QTc 539 - Held Zoloft - Check magnesium level   GERD - Continue pharmacy substitution of Protonix   Palliative care discussion-patient is a DO NOT RESUSCITATE CT scan in [facility] shows concern for chronic  aspiration Patient has severe protein calorie malnutrition recurrent hospitalization Has had a steady decline, wife states that patient's oncologist at the [facility] recommended hospice at home  DVT prophylaxsis Lovenox  Code Status:  DO NOT RESUSCITATE     Family Communication: Discussed in detail with the patient, all imaging results, lab results explained to the patient   Disposition Plan:  Plan to discharge home with hospice tomorrow 11/26      Consultants:  None  Procedures:  None  Antibiotics: Anti-infectives    Start     Dose/Rate Route Frequency Ordered Stop   08/23/16 1200  ceFEPIme (MAXIPIME) 500 mg in dextrose 5 % 50 mL IVPB     500 mg 100 mL/hr over 30 Minutes Intravenous Every 24 hours 08/23/16 0925     08/22/16 0130  aztreonam (AZACTAM) 2 g in dextrose 5 % 50 mL IVPB     2 g 100 mL/hr over 30 Minutes Intravenous  Once 08/22/16 0115 08/22/16 0225   08/22/16 0130  vancomycin (VANCOCIN) IVPB 750 mg/150 ml premix     750 mg 150 mL/hr over 60 Minutes Intravenous  Once 08/22/16 0127 08/22/16 0325         HPI/Subjective:  sob improved overnight , appears comfortable, HOH   Objective: Vitals:   08/23/16 1128 08/23/16 1243 08/23/16 2255 08/24/16 0514  BP: 127/66  115/61 124/63  Pulse: 63  64 60  Resp: '16  18 18  '$ Temp:  97.4 F (36.3 C) 97.4 F (36.3 C) 97.5 F (36.4 C)  TempSrc:  Oral Oral Axillary  SpO2: 100%  100% 100%  Weight:    39.8 kg (87 lb 12.8 oz)  Height:        Intake/Output Summary (Last 24 hours) at 08/24/16 0841 Last data filed at 08/24/16 0516  Gross per 24 hour  Intake              240 ml  Output             1350 ml  Net            -1110 ml    Exam:  Examination:  General exam: Appears calm and comfortable  Respiratory system: Clear to auscultation. Respiratory effort normal. Cardiovascular system: S1 & S2 heard, RRR. No JVD, murmurs, rubs, gallops or clicks. No pedal edema. Gastrointestinal system: Abdomen is nondistended,  soft and nontender. No organomegaly or masses felt. Normal bowel sounds heard. Central nervous system: Alert and oriented. No focal neurological deficits. Extremities: Symmetric 5 x 5 power. Skin: No rashes, lesions or ulcers Psychiatry: Judgement and insight appear normal. Mood & affect appropriate.     Data Reviewed: I have personally reviewed following labs and imaging studies  Micro Results Recent Results (from the past 240 hour(s))  Culture, blood (routine x 2) Call MD if unable to obtain prior to antibiotics being given     Status: None   Collection Time: 08/15/16  7:19 AM  Result Value Ref Range Status   Specimen Description BLOOD RIGHT ANTECUBITAL  Final   Special Requests   Final    BOTTLES DRAWN AEROBIC AND ANAEROBIC 10CC AER 5CC ANA   Culture NO GROWTH 5 DAYS  Final   Report Status 08/20/2016 FINAL  Final  Culture, blood (routine  x 2) Call MD if unable to obtain prior to antibiotics being given     Status: None   Collection Time: 08/15/16  7:28 AM  Result Value Ref Range Status   Specimen Description BLOOD RIGHT HAND  Final   Special Requests BOTTLES DRAWN AEROBIC ONLY 5CC  Final   Culture NO GROWTH 5 DAYS  Final   Report Status 08/20/2016 FINAL  Final  Culture, Urine     Status: Abnormal   Collection Time: 08/17/16  8:26 AM  Result Value Ref Range Status   Specimen Description URINE, RANDOM  Final   Special Requests NONE  Final   Culture >=100,000 COLONIES/mL ESCHERICHIA COLI (A)  Final   Report Status 08/19/2016 FINAL  Final   Organism ID, Bacteria ESCHERICHIA COLI (A)  Final      Susceptibility   Escherichia coli - MIC*    AMPICILLIN 16 INTERMEDIATE Intermediate     CEFAZOLIN <=4 SENSITIVE Sensitive     CEFTRIAXONE <=1 SENSITIVE Sensitive     CIPROFLOXACIN >=4 RESISTANT Resistant     GENTAMICIN <=1 SENSITIVE Sensitive     IMIPENEM <=0.25 SENSITIVE Sensitive     NITROFURANTOIN <=16 SENSITIVE Sensitive     TRIMETH/SULFA >=320 RESISTANT Resistant      AMPICILLIN/SULBACTAM 4 SENSITIVE Sensitive     PIP/TAZO 8 SENSITIVE Sensitive     Extended ESBL NEGATIVE Sensitive     * >=100,000 COLONIES/mL ESCHERICHIA COLI  MRSA PCR Screening     Status: Abnormal   Collection Time: 08/23/16 11:23 AM  Result Value Ref Range Status   MRSA by PCR POSITIVE (A) NEGATIVE Final    Comment:        The GeneXpert MRSA Assay (FDA approved for NASAL specimens only), is one component of a comprehensive MRSA colonization surveillance program. It is not intended to diagnose MRSA infection nor to guide or monitor treatment for MRSA infections. CRITICAL RESULT CALLED TO, READ BACK BY AND VERIFIED WITH: M. [place] RN AT 1420 ON 08/23/16 BY C. JESSUP, MLT.     Radiology Reports Dg Chest 2 View  Result Date: 08/23/2016 CLINICAL DATA:  Shortness of Breath EXAM: CHEST  2 VIEW COMPARISON:  Chest radiograph April 20, 2016; chest CT April 21, 2016 FINDINGS: There is airspace consolidation with pleural effusion in the left lower lobe. The right lung is clear except for small granulomas in the right apex. Heart is mildly enlarged with pulmonary vascularity within normal limits. There is atherosclerotic calcification in the aorta. No adenopathy. Bones are osteoporotic. Multiple wedge compression fractures in the thoracic and upper lumbar regions are stable. There is increase kyphosis in the upper thoracic region, stable. IMPRESSION: Left lower lobe airspace consolidation consistent with pneumonia. Small left effusion. Lungs elsewhere clear. Heart mildly prominent with pulmonary vascularity within normal limits. There is aortic atherosclerosis. There is osteoporosis with multiple stable appearing vertebral body compression fractures. Electronically Signed   By: Lowella Grip III M.D.   On: 08/23/2016 11:09   Ct Chest Wo Contrast  Result Date: 08/22/2016 CLINICAL DATA:  Shortness of breath for the past few days EXAM: CT CHEST WITHOUT CONTRAST TECHNIQUE: Multidetector CT  imaging of the chest was performed following the standard protocol without IV contrast. COMPARISON:  Chest x-ray from yesterday FINDINGS: Cardiovascular: Borderline cardiomegaly. No pericardial effusion. Extensive atherosclerotic calcification. Aortic valvular calcification. Tortuosity of the great vessels. No acute vascular finding. Mediastinum/Nodes: Negative for adenopathy or mass. Lungs/Pleura: Airspace disease and volume loss in the left more than right lower lobes lingula.  Small left pleural effusion posteriorly at the bases, with pleural thickening where contiguous with the lower lobe opacity. There is some patchy high density material within the left basilar opacity which could reflect aspirated material. These opacities have developed since 2016. Upper Abdomen: No acute finding. Cholecystectomy with common bile duct dilatation. Suspect main pancreatic duct dilatation. Musculoskeletal: Severe osteopenia. Cachexia. Remote posttraumatic deformity of the proximal right humerus and multiple bilateral ribs. There are chronic appearing compression fractures of T2, T3, T4, T5, T9, and L2. Height loss causes exaggerated thoracic kyphosis in the upper thoracic spine. The T9 fracture appears most recent, but is not acute. IMPRESSION: 1. Bibasilar opacity that is greatest in the left lower lobe. Most of the opacity is attributed atelectasis, but pneumonia could be superimposed if there are infectious symptoms. Small left pleural effusion with pleural thickening in the region of the left lower lobe opacity, if persistent round atelectasis or mass is likely. Recommend follow-up radiography 2 to 3 weeks after any treatment. 2. Question main pancreatic duct dilatation. Suggest abdominal sonography initially in this patient with elevated creatinine. 3. Marked osteopenia with numerous remote compression and rib fractures. Electronically Signed   By: Monte Fantasia M.D.   On: 08/22/2016 15:21   Nm Pulmonary Perf And  Vent  Result Date: 08/23/2016 CLINICAL DATA:  Congestive heart failure, COPD on 3 L of oxygen at night. Patient complains of shortness of breath. EXAM: NUCLEAR MEDICINE VENTILATION - PERFUSION LUNG SCAN TECHNIQUE: Ventilation images were obtained in multiple projections using inhaled aerosol Tc-70mDTPA. Perfusion images were obtained in multiple projections after intravenous injection of Tc-917mAA. RADIOPHARMACEUTICALS:  32 mCi Technetium-9951mPA aerosol inhalation and 4.2 mCi Technetium-35m9m IV COMPARISON:  CT of the chest 08/22/2016 FINDINGS: Ventilation: No focal ventilation defect. Osteopenic defect in the left lower lobe likely corresponds to the airspace consolidation versus mass seen by recent CT. Perfusion: No wedge shaped peripheral perfusion defects to suggest acute pulmonary embolism. IMPRESSION: Low probability for pulmonary embolus. Electronically Signed   By: DobrFidela Salisbury.   On: 08/23/2016 10:54   Dg Chest Portable 1 View  Result Date: 08/21/2016 CLINICAL DATA:  89 y49  M; shortness of breath. EXAM: PORTABLE CHEST 1 VIEW COMPARISON:  08/15/2016 chest radiograph FINDINGS: Small left pleural effusion. Left mid and lower lung zone opacities may represent associated atelectasis or pneumonia. Clear right lung. Stable cardiac silhouette. Aortic atherosclerosis with arch calcification. Lower thoracic kyphoplasty changes. Cholecystectomy clips in right upper quadrant. Fracture deformity of right proximal humerus. IMPRESSION: Small left pleural effusion and left mid and lower lung zone opacities which may represent associated atelectasis or pneumonia. Electronically Signed   By: LancKristine Garbe.   On: 08/21/2016 22:37   Dg Chest Port 1 View  Result Date: 08/15/2016 CLINICAL DATA:  Acute onset of shortness of breath. Initial encounter. EXAM: PORTABLE CHEST 1 VIEW COMPARISON:  Chest radiograph performed 05/20/2015 FINDINGS: The lungs are relatively well-aerated.  Bilateral central airspace opacification raises concern for multifocal pneumonia, though interstitial edema might have a similar appearance. A small left pleural effusion is suspected. No pneumothorax is seen, though the lung apices are partially obscured by the patient's head. The cardiomediastinal silhouette is borderline normal in size. No acute osseous abnormalities are identified. IMPRESSION: Bilateral central airspace opacification raises concern for multifocal pneumonia, though interstitial edema might have a similar appearance. Small left pleural effusion suspected. Electronically Signed   By: JeffGarald Balding.   On: 08/15/2016 03:07     CBC  Recent Labs Lab 08/22/16 0028 08/22/16 0431 08/23/16 0335 08/24/16 0312  WBC 7.3 5.7 6.2 6.9  HGB 9.5* 9.5* 9.3* 9.7*  HCT 28.8* 29.2* 28.3* 29.7*  PLT 130* 101* 122* 167  MCV 100.7* 101.0* 101.1* 100.3*  MCH 33.2 32.9 33.2 32.8  MCHC 33.0 32.5 32.9 32.7  RDW 15.6* 15.7* 15.8* 15.4  LYMPHSABS 1.2 1.0  --   --   MONOABS 0.6 0.6  --   --   EOSABS 0.5 0.6  --   --   BASOSABS 0.1 0.0  --   --     Chemistries   Recent Labs Lab 08/18/16 0323 08/21/16 2255 08/22/16 0431 08/23/16 0335  NA 138 140  --  139  K 3.6 3.8  --  3.3*  CL 102 107  --  101  CO2 29 26  --  30  GLUCOSE 103* 239*  --  164*  BUN 50* 48*  --  44*  CREATININE 1.30* 1.19  --  1.29*  CALCIUM 9.0 8.7*  --  8.7*  MG  --   --  1.9  --   AST  --   --   --  48*  ALT  --   --   --  32  ALKPHOS  --   --   --  132*  BILITOT  --   --   --  0.4   ------------------------------------------------------------------------------------------------------------------ estimated creatinine clearance is 20.1 mL/min (by C-G formula based on SCr of 1.29 mg/dL (H)). ------------------------------------------------------------------------------------------------------------------ No results for input(s): HGBA1C in the last 72  hours. ------------------------------------------------------------------------------------------------------------------ No results for input(s): CHOL, HDL, LDLCALC, TRIG, CHOLHDL, LDLDIRECT in the last 72 hours. ------------------------------------------------------------------------------------------------------------------ No results for input(s): TSH, T4TOTAL, T3FREE, THYROIDAB in the last 72 hours.  Invalid input(s): FREET3 ------------------------------------------------------------------------------------------------------------------ No results for input(s): VITAMINB12, FOLATE, FERRITIN, TIBC, IRON, RETICCTPCT in the last 72 hours.  Coagulation profile No results for input(s): INR, PROTIME in the last 168 hours.   Recent Labs  08/22/16 0941  DDIMER 1.70*    Cardiac Enzymes  Recent Labs Lab 08/22/16 0431 08/22/16 0923 08/22/16 1606  TROPONINI 0.15* 0.14* 0.13*   ------------------------------------------------------------------------------------------------------------------ Invalid input(s): POCBNP   CBG:  Recent Labs Lab 08/23/16 0627 08/23/16 1137 08/23/16 1627 08/23/16 2315 08/24/16 0802  GLUCAP 161* 91 158* 128* 51*       Studies: Dg Chest 2 View  Result Date: 08/23/2016 CLINICAL DATA:  Shortness of Breath EXAM: CHEST  2 VIEW COMPARISON:  Chest radiograph April 20, 2016; chest CT April 21, 2016 FINDINGS: There is airspace consolidation with pleural effusion in the left lower lobe. The right lung is clear except for small granulomas in the right apex. Heart is mildly enlarged with pulmonary vascularity within normal limits. There is atherosclerotic calcification in the aorta. No adenopathy. Bones are osteoporotic. Multiple wedge compression fractures in the thoracic and upper lumbar regions are stable. There is increase kyphosis in the upper thoracic region, stable. IMPRESSION: Left lower lobe airspace consolidation consistent with pneumonia. Small left  effusion. Lungs elsewhere clear. Heart mildly prominent with pulmonary vascularity within normal limits. There is aortic atherosclerosis. There is osteoporosis with multiple stable appearing vertebral body compression fractures. Electronically Signed   By: Lowella Grip III M.D.   On: 08/23/2016 11:09   Ct Chest Wo Contrast  Result Date: 08/22/2016 CLINICAL DATA:  Shortness of breath for the past few days EXAM: CT CHEST WITHOUT CONTRAST TECHNIQUE: Multidetector CT imaging of the chest was performed following the standard protocol without IV  contrast. COMPARISON:  Chest x-ray from yesterday FINDINGS: Cardiovascular: Borderline cardiomegaly. No pericardial effusion. Extensive atherosclerotic calcification. Aortic valvular calcification. Tortuosity of the great vessels. No acute vascular finding. Mediastinum/Nodes: Negative for adenopathy or mass. Lungs/Pleura: Airspace disease and volume loss in the left more than right lower lobes lingula. Small left pleural effusion posteriorly at the bases, with pleural thickening where contiguous with the lower lobe opacity. There is some patchy high density material within the left basilar opacity which could reflect aspirated material. These opacities have developed since 2016. Upper Abdomen: No acute finding. Cholecystectomy with common bile duct dilatation. Suspect main pancreatic duct dilatation. Musculoskeletal: Severe osteopenia. Cachexia. Remote posttraumatic deformity of the proximal right humerus and multiple bilateral ribs. There are chronic appearing compression fractures of T2, T3, T4, T5, T9, and L2. Height loss causes exaggerated thoracic kyphosis in the upper thoracic spine. The T9 fracture appears most recent, but is not acute. IMPRESSION: 1. Bibasilar opacity that is greatest in the left lower lobe. Most of the opacity is attributed atelectasis, but pneumonia could be superimposed if there are infectious symptoms. Small left pleural effusion with pleural  thickening in the region of the left lower lobe opacity, if persistent round atelectasis or mass is likely. Recommend follow-up radiography 2 to 3 weeks after any treatment. 2. Question main pancreatic duct dilatation. Suggest abdominal sonography initially in this patient with elevated creatinine. 3. Marked osteopenia with numerous remote compression and rib fractures. Electronically Signed   By: Monte Fantasia M.D.   On: 08/22/2016 15:21   Nm Pulmonary Perf And Vent  Result Date: 08/23/2016 CLINICAL DATA:  Congestive heart failure, COPD on 3 L of oxygen at night. Patient complains of shortness of breath. EXAM: NUCLEAR MEDICINE VENTILATION - PERFUSION LUNG SCAN TECHNIQUE: Ventilation images were obtained in multiple projections using inhaled aerosol Tc-47mDTPA. Perfusion images were obtained in multiple projections after intravenous injection of Tc-935mAA. RADIOPHARMACEUTICALS:  32 mCi Technetium-9945mPA aerosol inhalation and 4.2 mCi Technetium-63m76m IV COMPARISON:  CT of the chest 08/22/2016 FINDINGS: Ventilation: No focal ventilation defect. Osteopenic defect in the left lower lobe likely corresponds to the airspace consolidation versus mass seen by recent CT. Perfusion: No wedge shaped peripheral perfusion defects to suggest acute pulmonary embolism. IMPRESSION: Low probability for pulmonary embolus. Electronically Signed   By: DobrFidela Salisbury.   On: 08/23/2016 10:54      Lab Results  Component Value Date   HGBA1C 5.5 08/15/2016   HGBA1C 7.8 (H) 04/03/2014   HGBA1C 7.4 (H) 03/21/2013   Lab Results  Component Value Date   LDLCALC 45 08/15/2016   CREATININE 1.29 (H) 08/23/2016       Scheduled Meds: . amLODipine  2.5 mg Oral Daily  . aspirin EC  81 mg Oral Daily  . bicalutamide  50 mg Oral Daily  . ceFEPime (MAXIPIME) IV  500 mg Intravenous Q24H  . Chlorhexidine Gluconate Cloth  6 each Topical Q0600  . cycloSPORINE  1 drop Both Eyes BID  . enoxaparin (LOVENOX) injection   40 mg Subcutaneous Q24H  . feeding supplement (PRO-STAT SUGAR FREE 64)  30 mL Oral Daily  . furosemide  20 mg Oral BID  . gabapentin  100 mg Oral TID  . insulin aspart  0-9 Units Subcutaneous TID WC  . insulin glargine  8 Units Subcutaneous QHS  . latanoprost  1 drop Right Eye QHS  . lipase/protease/amylase  12,000 Units Oral TID WC  . metoprolol succinate  12.5 mg Oral Daily  .  mupirocin ointment  1 application Nasal BID  . pantoprazole  40 mg Oral QODAY  . potassium chloride  20 mEq Oral BID  . senna-docusate  1 tablet Oral QHS  . simvastatin  10 mg Oral QODAY  . sodium chloride flush  3 mL Intravenous Q12H   Continuous Infusions:   LOS: 2 days    Time spent: >30 MINS    [facility]  [facility] Pager [phone]. If 7PM-7AM, please contact night-coverage at [email or web address], password [facility] 08/24/2016, 8:41 AM  LOS: 2 days

## 2016-08-25 LAB — BASIC METABOLIC PANEL
ANION GAP: 8 (ref 5–15)
BUN: 46 mg/dL — ABNORMAL HIGH (ref 6–20)
CHLORIDE: 102 mmol/L (ref 101–111)
CO2: 29 mmol/L (ref 22–32)
Calcium: 9.1 mg/dL (ref 8.9–10.3)
Creatinine, Ser: 1.38 mg/dL — ABNORMAL HIGH (ref 0.61–1.24)
GFR, EST AFRICAN AMERICAN: 49 mL/min — AB (ref >60)
GFR, EST NON AFRICAN AMERICAN: 42 mL/min — AB (ref >60)
Glucose, Bld: 164 mg/dL — ABNORMAL HIGH (ref 65–99)
POTASSIUM: 4.1 mmol/L (ref 3.5–5.1)
SODIUM: 139 mmol/L (ref 135–145)

## 2016-08-25 LAB — GLUCOSE, CAPILLARY
GLUCOSE-CAPILLARY: 162 mg/dL — AB (ref 65–99)
GLUCOSE-CAPILLARY: 137 mg/dL — AB (ref 65–99)
GLUCOSE-CAPILLARY: 151 mg/dL — AB (ref 65–99)
Glucose-Capillary: 193 mg/dL — ABNORMAL HIGH (ref 65–99)

## 2016-08-25 MED ORDER — MORPHINE SULFATE (CONCENTRATE) 10 MG/0.5ML PO SOLN: 5.0000 mg | Freq: Four times a day (QID) | ORAL | 0 refills | Status: DC | PRN

## 2016-08-25 MED ORDER — ACETAMINOPHEN 325 MG PO TABS: 650.0000 mg | ORAL_TABLET | ORAL | 1 refills | Status: AC | PRN

## 2016-08-25 MED ORDER — POTASSIUM CHLORIDE CRYS ER 20 MEQ PO TBCR: 20.0000 meq | EXTENDED_RELEASE_TABLET | Freq: Every day | ORAL | 1 refills | Status: AC

## 2016-08-25 MED ORDER — FUROSEMIDE 20 MG PO TABS: 20.0000 mg | ORAL_TABLET | Freq: Two times a day (BID) | ORAL | 1 refills | Status: DC

## 2016-08-25 NOTE — Progress Notes (Signed)
Patient alert and oriented x2, denies pain, 100%/2L. Iv and tele d/c. D/c instruction explain to the wife via phone, all questions answered, patient wife verbalized understanding. Patient is waiting for [facility] to transport patient home. Report given to the night shift RN.

## 2016-08-25 NOTE — Progress Notes (Signed)
Patient discharged: Home  Via: EMS  Discharge paperwork given: to patient and EMS staff  Education and d/c instruction given to wife by nurse on day shift  IV and telemetry disconnected  Belongings given to patient  Venetia Night, RN

## 2016-08-25 NOTE — Discharge Summary (Signed)
Physician Discharge Summary  Guy [name] MRN: [id number] DOB/AGE: Mar 24, 1920 82 y.o.  PCP: Guy Patricia, MD   Admit date: 08/21/2016 Discharge date: 08/25/2016  Discharge Diagnoses:    Principal Problem:   Acute on chronic respiratory failure with hypoxia [facility]) Active Problems:   Prostate cancer metastatic to multiple sites ([place])   Anemia of chronic disease   Elevated troponin   Type 2 diabetes mellitus without complication (HCC)   Acute on chronic combined systolic and diastolic congestive heart failure (HCC)   Pressure ulcer, stage III (HCC)   SOB (shortness of breath)   HCAP (healthcare-associated pneumonia)   Palliative care encounter    Follow-up recommendations Follow-up with PCP in 3-5 days , including all  additional recommended appointments as below Patient is being discharged home with hospice       Current Discharge Medication List    START taking these medications   Details  acetaminophen (TYLENOL) 325 MG tablet Take 2 tablets (650 mg total) by mouth every 4 (four) hours as needed for headache or mild pain. Qty: 60 tablet, Refills: 1    furosemide (LASIX) 20 MG tablet Take 1 tablet (20 mg total) by mouth 2 (two) times daily. Qty: 60 tablet, Refills: 1    potassium chloride SA (K-DUR,KLOR-CON) 20 MEQ tablet Take 1 tablet (20 mEq total) by mouth daily. Qty: 30 tablet, Refills: 1      CONTINUE these medications which have NOT CHANGED   Details  Amino Acids-Protein Hydrolys (FEEDING SUPPLEMENT, PRO-STAT SUGAR FREE 64,) LIQD Take 30 mLs by mouth daily. Qty: 900 mL, Refills: 0    amLODipine (NORVASC) 2.5 MG tablet Take 1 tablet (2.5 mg total) by mouth daily. Qty: 30 tablet, Refills: 0    aspirin 81 MG tablet Take 81 mg by mouth daily. For heart theraphy    bicalutamide (CASODEX) 50 MG tablet Take 50 mg by mouth daily.    Calcium Carbonate-Vitamin D (CALCIUM 600+D HIGH POTENCY) 600-400 MG-UNIT per tablet Take 1 tablet by mouth 2 (two) times  daily.      CRANBERRY CONCENTRATE PO Take 1 capsule by mouth 3 (three) times daily.     cycloSPORINE (RESTASIS) 0.05 % ophthalmic emulsion Place 1 drop into both eyes 2 (two) times daily. Qty: 0.4 mL, Refills: 0    gabapentin (NEURONTIN) 100 MG capsule Take 100 mg by mouth 3 (three) times daily.     insulin glargine (LANTUS) 100 UNIT/ML injection Inject 0.08 mLs (8 Units total) into the skin at bedtime. Qty: 10 mL, Refills: 11    latanoprost (XALATAN) 0.005 % ophthalmic solution Place 1 drop into the right eye at bedtime. Qty: 2.5 mL, Refills: 12    metoprolol succinate (TOPROL-XL) 25 MG 24 hr tablet Take 0.5 tablets (12.5 mg total) by mouth daily.    Multiple Vitamins-Minerals (HAIR/SKIN/NAILS) TABS Take 1 tablet by mouth 3 (three) times daily.    nitroGLYCERIN (NITROSTAT) 0.4 MG SL tablet Place 0.4 mg under the tongue every 5 (five) minutes as needed for chest pain.    omeprazole (PRILOSEC) 20 MG capsule Take 20 mg by mouth every other day. For GERD    Pancrelipase, Lip-Prot-Amyl, 5000 units CPEP Take 5,000 Units by mouth 3 (three) times daily.    senna-docusate (SENOKOT S) 8.6-50 MG per tablet Take 1 tablet by mouth at bedtime. For constipation Qty: 30 tablet, Refills: 3    simvastatin (ZOCOR) 20 MG tablet Take 10 mg by mouth every other day. Take 1/2 tablet =10 mg for HLD.  STOP taking these medications     doxycycline (VIBRA-TABS) 100 MG tablet      minocycline (MINOCIN,DYNACIN) 100 MG capsule          Discharge Condition: Overall prognosis poor   Discharge Instructions Get Medicines reviewed and adjusted: Please take all your medications with you for your next visit with your Primary MD  Please request your Primary MD to go over all hospital tests and procedure/radiological results at the follow up, please ask your Primary MD to get all Hospital records sent to his/her office.  If you experience worsening of your admission symptoms, develop shortness of  breath, life threatening emergency, suicidal or homicidal thoughts you must seek medical attention immediately by calling 911 or calling your MD immediately if symptoms less severe.  You must read complete instructions/literature along with all the possible adverse reactions/side effects for all the Medicines you take and that have been prescribed to you. Take any new Medicines after you have completely understood and accpet all the possible adverse reactions/side effects.   Do not drive when taking Pain medications.   Do not take more than prescribed Pain, Sleep and Anxiety Medications  Special Instructions: If you have smoked or chewed Tobacco in the last 2 yrs please stop smoking, stop any regular Alcohol and or any Recreational drug use.  Wear Seat belts while driving.  Please note  You were cared for by a hospitalist during your hospital stay. Once you are discharged, your primary care physician will handle any further medical issues. Please note that NO REFILLS for any discharge medications will be authorized once you are discharged, as it is imperative that you return to your primary care physician (or establish a relationship with a primary care physician if you do not have one) for your aftercare needs so that they can reassess your need for medications and monitor your lab values.     Allergies  Allergen Reactions  . Albuterol Sulfate Hfa [Kdc:Albuterol] Shortness Of Breath and Swelling  . Adhesive [Tape] Other (See Comments)    REACTION: SKIN BLISTERS  . Albuterol Swelling    Per pt., side of his neck began to swell with nebulized Albuterol in doctor's office.  . Lactose Other (See Comments)    Other reaction(s): GI Upset (intolerance)  . Penicillins Itching and Other (See Comments)    REACTION: ITCHING HANDS Has patient had a PCN reaction causing immediate rash, facial/tongue/throat swelling, SOB or lightheadedness with hypotension: No Has patient had a PCN reaction  causing severe rash involving mucus membranes or skin necrosis: No Has patient had a PCN reaction that required hospitalization Yes Has patient had a PCN reaction occurring within the last 10 years: No If all of the above answers are "NO", then may proceed with Cephalosporin use.   . Sulfa Drugs Cross Reactors Hives  . Budesonide-Formoterol Fumarate Rash  . Contrast Media [Iodinated Diagnostic Agents] Other (See Comments)    Reaction unknown  . Ioxaglate Other (See Comments)    Reaction unknown  . Metrizamide Other (See Comments)    Reaction unknown  . Sulfamethoxazole Rash      Disposition: 06-Home-Health Care Svc   Consults:  Palliative care     Significant Diagnostic Studies:  Dg Chest 2 View  Result Date: 08/23/2016 CLINICAL DATA:  Shortness of Breath EXAM: CHEST  2 VIEW COMPARISON:  Chest radiograph April 20, 2016; chest CT April 21, 2016 FINDINGS: There is airspace consolidation with pleural effusion in the left lower lobe. The right lung  is clear except for small granulomas in the right apex. Heart is mildly enlarged with pulmonary vascularity within normal limits. There is atherosclerotic calcification in the aorta. No adenopathy. Bones are osteoporotic. Multiple wedge compression fractures in the thoracic and upper lumbar regions are stable. There is increase kyphosis in the upper thoracic region, stable. IMPRESSION: Left lower lobe airspace consolidation consistent with pneumonia. Small left effusion. Lungs elsewhere clear. Heart mildly prominent with pulmonary vascularity within normal limits. There is aortic atherosclerosis. There is osteoporosis with multiple stable appearing vertebral body compression fractures. Electronically Signed   By: Lowella Grip III M.D.   On: 08/23/2016 11:09   Ct Chest Wo Contrast  Result Date: 08/22/2016 CLINICAL DATA:  Shortness of breath for the past few days EXAM: CT CHEST WITHOUT CONTRAST TECHNIQUE: Multidetector CT imaging of the  chest was performed following the standard protocol without IV contrast. COMPARISON:  Chest x-ray from yesterday FINDINGS: Cardiovascular: Borderline cardiomegaly. No pericardial effusion. Extensive atherosclerotic calcification. Aortic valvular calcification. Tortuosity of the great vessels. No acute vascular finding. Mediastinum/Nodes: Negative for adenopathy or mass. Lungs/Pleura: Airspace disease and volume loss in the left more than right lower lobes lingula. Small left pleural effusion posteriorly at the bases, with pleural thickening where contiguous with the lower lobe opacity. There is some patchy high density material within the left basilar opacity which could reflect aspirated material. These opacities have developed since 2016. Upper Abdomen: No acute finding. Cholecystectomy with common bile duct dilatation. Suspect main pancreatic duct dilatation. Musculoskeletal: Severe osteopenia. Cachexia. Remote posttraumatic deformity of the proximal right humerus and multiple bilateral ribs. There are chronic appearing compression fractures of T2, T3, T4, T5, T9, and L2. Height loss causes exaggerated thoracic kyphosis in the upper thoracic spine. The T9 fracture appears most recent, but is not acute. IMPRESSION: 1. Bibasilar opacity that is greatest in the left lower lobe. Most of the opacity is attributed atelectasis, but pneumonia could be superimposed if there are infectious symptoms. Small left pleural effusion with pleural thickening in the region of the left lower lobe opacity, if persistent round atelectasis or mass is likely. Recommend follow-up radiography 2 to 3 weeks after any treatment. 2. Question main pancreatic duct dilatation. Suggest abdominal sonography initially in this patient with elevated creatinine. 3. Marked osteopenia with numerous remote compression and rib fractures. Electronically Signed   By: Monte Fantasia M.D.   On: 08/22/2016 15:21   Nm Pulmonary Perf And Vent  Result Date:  08/23/2016 CLINICAL DATA:  Congestive heart failure, COPD on 3 L of oxygen at night. Patient complains of shortness of breath. EXAM: NUCLEAR MEDICINE VENTILATION - PERFUSION LUNG SCAN TECHNIQUE: Ventilation images were obtained in multiple projections using inhaled aerosol Tc-96mDTPA. Perfusion images were obtained in multiple projections after intravenous injection of Tc-970mAA. RADIOPHARMACEUTICALS:  32 mCi Technetium-9967mPA aerosol inhalation and 4.2 mCi Technetium-75m23m IV COMPARISON:  CT of the chest 08/22/2016 FINDINGS: Ventilation: No focal ventilation defect. Osteopenic defect in the left lower lobe likely corresponds to the airspace consolidation versus mass seen by recent CT. Perfusion: No wedge shaped peripheral perfusion defects to suggest acute pulmonary embolism. IMPRESSION: Low probability for pulmonary embolus. Electronically Signed   By: DobrFidela Salisbury.   On: 08/23/2016 10:54   Dg Chest Portable 1 View  Result Date: 08/21/2016 CLINICAL DATA:  89 y47  M; shortness of breath. EXAM: PORTABLE CHEST 1 VIEW COMPARISON:  08/15/2016 chest radiograph FINDINGS: Small left pleural effusion. Left mid and lower lung zone opacities may  represent associated atelectasis or pneumonia. Clear right lung. Stable cardiac silhouette. Aortic atherosclerosis with arch calcification. Lower thoracic kyphoplasty changes. Cholecystectomy clips in right upper quadrant. Fracture deformity of right proximal humerus. IMPRESSION: Small left pleural effusion and left mid and lower lung zone opacities which may represent associated atelectasis or pneumonia. Electronically Signed   By: Kristine Garbe M.D.   On: 08/21/2016 22:37   Dg Chest Port 1 View  Result Date: 08/15/2016 CLINICAL DATA:  Acute onset of shortness of breath. Initial encounter. EXAM: PORTABLE CHEST 1 VIEW COMPARISON:  Chest radiograph performed 05/20/2015 FINDINGS: The lungs are relatively well-aerated. Bilateral central airspace  opacification raises concern for multifocal pneumonia, though interstitial edema might have a similar appearance. A small left pleural effusion is suspected. No pneumothorax is seen, though the lung apices are partially obscured by the patient's head. The cardiomediastinal silhouette is borderline normal in size. No acute osseous abnormalities are identified. IMPRESSION: Bilateral central airspace opacification raises concern for multifocal pneumonia, though interstitial edema might have a similar appearance. Small left pleural effusion suspected. Electronically Signed   By: Garald Balding M.D.   On: 08/15/2016 03:07      Filed Weights   08/23/16 0629 08/24/16 0514 08/25/16 0557  Weight: 39.8 kg (87 lb 11.2 oz) 39.8 kg (87 lb 12.8 oz) 37.8 kg (83 lb 4.8 oz)     Microbiology: Recent Results (from the past 240 hour(s))  Culture, Urine     Status: Abnormal   Collection Time: 08/17/16  8:26 AM  Result Value Ref Range Status   Specimen Description URINE, RANDOM  Final   Special Requests NONE  Final   Culture >=100,000 COLONIES/mL ESCHERICHIA COLI (A)  Final   Report Status 08/19/2016 FINAL  Final   Organism ID, Bacteria ESCHERICHIA COLI (A)  Final      Susceptibility   Escherichia coli - MIC*    AMPICILLIN 16 INTERMEDIATE Intermediate     CEFAZOLIN <=4 SENSITIVE Sensitive     CEFTRIAXONE <=1 SENSITIVE Sensitive     CIPROFLOXACIN >=4 RESISTANT Resistant     GENTAMICIN <=1 SENSITIVE Sensitive     IMIPENEM <=0.25 SENSITIVE Sensitive     NITROFURANTOIN <=16 SENSITIVE Sensitive     TRIMETH/SULFA >=320 RESISTANT Resistant     AMPICILLIN/SULBACTAM 4 SENSITIVE Sensitive     PIP/TAZO 8 SENSITIVE Sensitive     Extended ESBL NEGATIVE Sensitive     * >=100,000 COLONIES/mL ESCHERICHIA COLI  MRSA PCR Screening     Status: Abnormal   Collection Time: 08/23/16 11:23 AM  Result Value Ref Range Status   MRSA by PCR POSITIVE (A) NEGATIVE Final    Comment:        The GeneXpert MRSA Assay  (FDA approved for NASAL specimens only), is one component of a comprehensive MRSA colonization surveillance program. It is not intended to diagnose MRSA infection nor to guide or monitor treatment for MRSA infections. CRITICAL RESULT CALLED TO, READ BACK BY AND VERIFIED WITH: M. [place] RN AT 1420 ON 08/23/16 BY C. JESSUP, MLT.        Blood Culture    Component Value Date/Time   SDES URINE, RANDOM 08/17/2016 0826   SPECREQUEST NONE 08/17/2016 0826   CULT >=100,000 COLONIES/mL ESCHERICHIA COLI (A) 08/17/2016 0826   REPTSTATUS 08/19/2016 FINAL 08/17/2016 0826      Labs: Results for orders placed or performed during the hospital encounter of 08/21/16 (from the past 48 hour(s))  MRSA PCR Screening     Status: Abnormal   Collection  Time: 08/23/16 11:23 AM  Result Value Ref Range   MRSA by PCR POSITIVE (A) NEGATIVE    Comment:        The GeneXpert MRSA Assay (FDA approved for NASAL specimens only), is one component of a comprehensive MRSA colonization surveillance program. It is not intended to diagnose MRSA infection nor to guide or monitor treatment for MRSA infections. CRITICAL RESULT CALLED TO, READ BACK BY AND VERIFIED WITH: M. [place] RN AT 1420 ON 08/23/16 BY C. JESSUP, MLT.   Glucose, capillary     Status: None   Collection Time: 08/23/16 11:37 AM  Result Value Ref Range   Glucose-Capillary 91 65 - 99 mg/dL   Comment 1 Notify RN   Glucose, capillary     Status: Abnormal   Collection Time: 08/23/16  4:27 PM  Result Value Ref Range   Glucose-Capillary 158 (H) 65 - 99 mg/dL   Comment 1 Notify RN   Glucose, capillary     Status: Abnormal   Collection Time: 08/23/16 11:15 PM  Result Value Ref Range   Glucose-Capillary 128 (H) 65 - 99 mg/dL   Comment 1 Notify RN    Comment 2 Document in Chart   CBC     Status: Abnormal   Collection Time: 08/24/16  3:12 AM  Result Value Ref Range   WBC 6.9 4.0 - 10.5 K/uL   RBC 2.96 (L) 4.22 - 5.81 MIL/uL   Hemoglobin  9.7 (L) 13.0 - 17.0 g/dL   HCT 29.7 (L) 39.0 - 52.0 %   MCV 100.3 (H) 78.0 - 100.0 fL   MCH 32.8 26.0 - 34.0 pg   MCHC 32.7 30.0 - 36.0 g/dL   RDW 15.4 11.5 - 15.5 %   Platelets 167 150 - 400 K/uL  Glucose, capillary     Status: Abnormal   Collection Time: 08/24/16  8:02 AM  Result Value Ref Range   Glucose-Capillary 51 (L) 65 - 99 mg/dL   Comment 1 Notify RN    Comment 2 Document in Chart   Glucose, capillary     Status: None   Collection Time: 08/24/16 11:19 AM  Result Value Ref Range   Glucose-Capillary 96 65 - 99 mg/dL   Comment 1 Notify RN    Comment 2 Document in Chart   Glucose, capillary     Status: Abnormal   Collection Time: 08/24/16  4:49 PM  Result Value Ref Range   Glucose-Capillary 164 (H) 65 - 99 mg/dL   Comment 1 Notify RN    Comment 2 Document in Chart   Glucose, capillary     Status: Abnormal   Collection Time: 08/24/16 10:09 PM  Result Value Ref Range   Glucose-Capillary 154 (H) 65 - 99 mg/dL   Comment 1 Notify RN    Comment 2 Document in Chart   Basic metabolic panel     Status: Abnormal   Collection Time: 08/25/16  5:43 AM  Result Value Ref Range   Sodium 139 135 - 145 mmol/L   Potassium 4.1 3.5 - 5.1 mmol/L   Chloride 102 101 - 111 mmol/L   CO2 29 22 - 32 mmol/L   Glucose, Bld 164 (H) 65 - 99 mg/dL   BUN 46 (H) 6 - 20 mg/dL   Creatinine, Ser 1.38 (H) 0.61 - 1.24 mg/dL   Calcium 9.1 8.9 - 10.3 mg/dL   GFR calc non Af Amer 42 (L) >60 mL/min   GFR calc Af Amer 49 (L) >60 mL/min  Comment: (NOTE) The eGFR has been calculated using the CKD EPI equation. This calculation has not been validated in all clinical situations. eGFR's persistently <60 mL/min signify possible Chronic Kidney Disease.    Anion gap 8 5 - 15  Glucose, capillary     Status: Abnormal   Collection Time: 08/25/16  5:55 AM  Result Value Ref Range   Glucose-Capillary 162 (H) 65 - 99 mg/dL   Comment 1 Notify RN    Comment 2 Document in Chart      Lipid Panel     Component  Value Date/Time   CHOL 95 08/15/2016 0314   TRIG 73 08/15/2016 0314   HDL 35 (L) 08/15/2016 0314   CHOLHDL 2.7 08/15/2016 0314   VLDL 15 08/15/2016 0314   LDLCALC 45 08/15/2016 0314     Lab Results  Component Value Date   HGBA1C 5.5 08/15/2016   HGBA1C 7.8 (H) 04/03/2014   HGBA1C 7.4 (H) 03/21/2013        HPI :* Guy [name] a 82 y.o.malewith medical history significant of combined CHF EF 50%, HTN, HLD, DM type II, COPD on 3 L oxygen at night, chronically elevated troponin, GERD, chronic indwelling Foley; who presents with complaints of shortness of breath.ED Course:Upon admission into the emergency department patient was seen to be afebrile, heart rate is 58-67, respirations 12-25, blood pressure maintained, and O2 saturations 98-100% on 2-3L of nasal cannula oxygen. Lab work revealed BNP 550.1, troponin 0.34, BUN 48, creatinine 1.19. Chest x-ray showed small left-sided pleural effusion with signs of left mid to lower lung zone opacities   HOSPITAL COURSE:  Acute on chronic Respiratory failure with hypoxia/ MILD acute on chronic combined systolic and diastolic CHF:   Patient with BNP 550and chest x-ray which showed sodium pleural effusion with mid to lower lung zone possible opacities. Patient had CT chest in May 2017 which showed left lower lobe opacity, concerning for possible aspiration pneumonia CT scan 11/23 showed bibasilar opacity, greater in the left lower lobe. No evidence of CHF Treated with  Lasix. D-dimer-elevated, VQ scan negative for  PE, Patient received IV cefepime and vancomycin for presumed pneumonia However antibiotics discontinued prior to discharge secondary to possibility of chronic aspiration, end of life measures in place Creatinine 1.38 prior to discharge  Probable recurrent aspiration pneumonia: Patient given empiric antibiotics for HCAP in ED. - Patient discharged on doxycycline for 3 days on 11/19 All antibiotics discontinued now as the  patient is being discharged home on hospice, will be at risk for chronic aspiration   History of prostrate cancer, recent Escherichia coli [id number] with chronic indwelling Foley changed over on a regular basis at [facility]. Previous urine culture revealed Escherichia coli. Per review of records it appears that his urine always has bacteria present therefore less likely acute complicated urinary tract infection. Family states that the patient's prostrate cancer has had a recurrence and currently he is receiving Lupron injections every 6 months   Elevated troponins: Acute on chronic.  Chronically elevated, recent 2-D echo did not show any wall motion abnormalities   Anemiaof chronic disease: hemoglobin appeared to have dropped from 10.5 down to 9.5 from previous discharge.. Hemoglobin A1c 9.7 prior to discharge  Thrombocytopenia-resolved, platelets 167K prior to discharge  Essential hypertension - Continue amlodipine and Metoprolol  Diabetes mellitus type 2 - Hypoglycemic protocols . Hemoglobin A1c 5.5 - Continue home Lantus 8 units subcutaneous at night    Hyperlipidemia  - Continue Zocor   Chronic kidney disease  stage III: Stable. Creatinine 1.38 prior to discharge   Prostate cancer - Continue Casodex/Lupron Family is aware that the patient has lost a significant amount of weight Patient's wife interested in hospice at home, case management consultation has been placed  Pressure ulcer stage III  - Low air loss mattress  WOC   Prolonged QTc 539 Can potentially DC Zoloft at home  GERD - Continue pharmacy substitution of Protonix   Palliative care discussion-  CT scan in [facility] shows concern for chronic aspiration Patient has severe protein calorie malnutrition recurrent hospitalization Has had a steady decline, wife states that patient's oncologist at the [facility] recommended hospice at home DO NOT RESUSCITATE DO NOT INTUBATE Recognizes that  he has dysphagia and could aspirate Wishes to pursue comfort feeds with this many safety recommendations as possible No PEG tube Desires hospice support in the home Case manager consult placed Code Status/Advance Care Planning: DNR    Discharge Exam:   Blood pressure (!) 112/57, pulse 65, temperature 97.3 F (36.3 C), temperature source Oral, resp. rate 18, height 5' 3"  (1.6 m), weight 37.8 kg (83 lb 4.8 oz), SpO2 100 %.  General exam: Cachectic Respiratory system: Clear to auscultation. Respiratory effort normal. Cardiovascular system: S1 & S2 heard, RRR. No JVD, murmurs, rubs, gallops or clicks. No pedal edema. Gastrointestinal system: Abdomen is nondistended, soft and nontender. No organomegaly or masses felt. Normal bowel sounds heard. Central nervous system: Alert and oriented. No focal neurological deficits. Extremities: Symmetric 5 x 5 power.    Follow-up Information    ALTHEIMER,MICHAEL D, MD. Call in 2 day(s).   Specialty:  Endocrinology Why:  PCP to make appointment, hospital follow-up Contact information: [place] [address] [phone]           Signed: Reyne Dumas 08/25/2016, 8:37 AM        Time spent >45 mins

## 2016-08-25 NOTE — Progress Notes (Signed)
CSW acknowledges consult for home hospice and will refer to [facility] for this floor.   No other SW interventions needed at this time.  CSW is signing off.  Orlanda Lemmerman B. Joline Maxcy Clinical Social Work Dept Weekend Social Worker [phone] 9:57 AM

## 2016-08-25 NOTE — Care Management Note (Signed)
Case Management Note  Patient Details  Name: Guy [name] MRN: [id number] Date of Birth: September 22, 1920  Subjective/Objective:                  Acute on chronic respiratory failure with hypoxia [name]) Action/Plan: Discharge planning Expected Discharge Date: 08/25/16               Expected Discharge Plan:  Home w Hospice Care  In-House Referral:     Discharge planning Services  CM Consult  Post Acute Care Choice:    Choice offered to:  Patient, Spouse  DME Arranged:  N/A DME Agency:  NA  HH Arranged:  NA HH Agency:  Other - See comment  Status of Service:  Completed, signed off  If discussed at [place] of Stay Meetings, dates discussed:    Additional Comments: CM met with pt and spouse, Guy [name] to offer choice of home hospice agency. [place] chooses [facility].  [place] requests I first check with agency to see if the [place] will still be able to provide caretaking of her husband.  Referral called to [facility] rep, Bambi who states caretaking hours from [place] will not be affected.  Guy [name] states pt already has a bed and home O2 and does not use pain medication.  CM will provide PTAR home once Bambi has notified this CM.  No other CM needs were communicated. Dellie Catholic, RN 08/25/2016, 12:58 PM

## 2016-08-28 ENCOUNTER — Ambulatory Visit (INDEPENDENT_AMBULATORY_CARE_PROVIDER_SITE_OTHER): Payer: Medicare Other | Admitting: Cardiovascular Disease

## 2016-08-28 ENCOUNTER — Encounter: Payer: Self-pay | Admitting: Cardiovascular Disease

## 2016-08-28 DIAGNOSIS — I2583 Coronary atherosclerosis due to lipid rich plaque: Secondary | ICD-10-CM

## 2016-08-28 DIAGNOSIS — I2510 Atherosclerotic heart disease of native coronary artery without angina pectoris: Secondary | ICD-10-CM

## 2016-08-28 DIAGNOSIS — I10 Essential (primary) hypertension: Secondary | ICD-10-CM

## 2016-08-28 DIAGNOSIS — E7800 Pure hypercholesterolemia, unspecified: Secondary | ICD-10-CM | POA: Diagnosis not present

## 2016-08-28 NOTE — Patient Instructions (Signed)

## 2016-08-28 NOTE — Assessment & Plan Note (Signed)
Guy [name] was recently hospitalized for respiratory distress. His BNP was moderately elevated he was diuresed. He was placed on antibiotics. His echo performed 08/14/16 revealed an EF of 50% with grade 2 diastolic dysfunction and mild aortic stenosis. He is somewhat dry on exam today. I'm going to cut back his Lasix from twice a day to once a day.

## 2016-08-28 NOTE — Assessment & Plan Note (Addendum)
History of hyperlipidemia on statin therapy with recent lipid profile performed 08/15/16 revealing a Total cholesterol 95, LDL 45 and HDL of 35

## 2016-08-28 NOTE — Assessment & Plan Note (Signed)
History of hypertension blood pressure measured today at 116/72. He is on low-dose amlodipine and metoprolol. Continue current meds at current dosing

## 2016-08-28 NOTE — Progress Notes (Signed)
08/28/2016 Tonette Lederer   Dec 07, 1919  [id number]  Primary Physician [name], MD Primary Cardiologist: Lorretta Harp MD [name]  HPI:  The patient is a very pleasant 82 year old thin and frail appearing married Caucasian male, father of 38, grandfather of 4 great grandchildren who is accompanied by his wife today who is also a patient of mine. I last saw him 01/31/16.[name] He has a history of CAD status post remote LAD stenting in 1996 in [place]. I catheterized him November 27, 2001 revealing a patent LAD stent with mild in-stent restenosis. His last Myoview performed in March of 2008 was nonischemic, as was one on June 12, 2009. His other problems include hypertension, hyperlipidemia, and non-insulin-requiring diabetes. He does have metastatic prostate cancer involving his lungs and ribs and has had multiple operations for these. He gets occasionally chest pain which is nitrate responsive. Since I saw him last he had hospitalizations for fractured vertebrae requiring kyphoplasty as well as urinary tract infection and surgery on his urethra. He has developed recurrent prostate cancer is being treated at the [facility]. He is for the most part wheelchair-bound and is extremely weak and cachectic. It sounds like he had a thoracentesis recently for pleural effusion. The chest x-ray reports shows a left lower lobe massI discussed his CODE STATUS and living will with his wife and apparently he does have a living will and wishes to be a DO NOT RESUSCITATE. Since I saw him in the office 6 months ago he was recently hospitalized with respiratory distress. He was treated with diuretics and antibiotics. His thought to have aspiration pneumonia. EF by 2-D echo was 50% with grade 2 diastolic dysfunction and mild aortic stenosis.    Current Outpatient Prescriptions  Medication Sig Dispense Refill  . acetaminophen (TYLENOL) 325 MG tablet Take 2 tablets  (650 mg total) by mouth every 4 (four) hours as needed for headache or mild pain. 60 tablet 1  . Amino Acids-Protein Hydrolys (FEEDING SUPPLEMENT, PRO-STAT SUGAR FREE 64,) LIQD Take 30 mLs by mouth daily. 900 mL 0  . amLODipine (NORVASC) 2.5 MG tablet Take 1 tablet (2.5 mg total) by mouth daily. 30 tablet 0  . aspirin 81 MG tablet Take 81 mg by mouth daily. For heart theraphy    . bicalutamide (CASODEX) 50 MG tablet Take 50 mg by mouth daily.    . Calcium Carbonate-Vitamin D (CALCIUM 600+D HIGH POTENCY) 600-400 MG-UNIT per tablet Take 1 tablet by mouth 2 (two) times daily.      [name] CRANBERRY CONCENTRATE PO Take 1 capsule by mouth 3 (three) times daily.     . cycloSPORINE (RESTASIS) 0.05 % ophthalmic emulsion Place 1 drop into both eyes 2 (two) times daily. 0.4 mL 0  . furosemide (LASIX) 20 MG tablet Take 1 tablet (20 mg total) by mouth 2 (two) times daily. 60 tablet 1  . insulin glargine (LANTUS) 100 UNIT/ML injection Inject 0.08 mLs (8 Units total) into the skin at bedtime. 10 mL 11  . latanoprost (XALATAN) 0.005 % ophthalmic solution Place 1 drop into the right eye at bedtime. 2.5 mL 12  . metoprolol succinate (TOPROL-XL) 25 MG 24 hr tablet Take 0.5 tablets (12.5 mg total) by mouth daily.    . Multiple Vitamins-Minerals (HAIR/SKIN/NAILS) TABS Take 1 tablet by mouth 3 (three) times daily.    . nitroGLYCERIN (NITROSTAT) 0.4 MG SL tablet Place 0.4 mg under the tongue every 5 (five) minutes as needed for  chest pain.    [name] omeprazole (PRILOSEC) 20 MG capsule Take 20 mg by mouth every other day. For GERD    . Pancrelipase, Lip-Prot-Amyl, 5000 units CPEP Take 5,000 Units by mouth 3 (three) times daily.    . potassium chloride SA (K-DUR,KLOR-CON) 20 MEQ tablet Take 1 tablet (20 mEq total) by mouth daily. 30 tablet 1  . senna-docusate (SENOKOT S) 8.6-50 MG per tablet Take 1 tablet by mouth at bedtime. For constipation 30 tablet 3  . simvastatin (ZOCOR) 20 MG tablet Take 10 mg by mouth every other day. Take  1/2 tablet =10 mg for HLD.     No current facility-administered medications for this visit.     Allergies  Allergen Reactions  . Albuterol Sulfate Hfa [Kdc:Albuterol] Shortness Of Breath and Swelling  . Adhesive [Tape] Other (See Comments)    REACTION: SKIN BLISTERS  . Albuterol Swelling    Per pt., side of his neck began to swell with nebulized Albuterol in doctor's office.  . Lactose Other (See Comments)    Other reaction(s): GI Upset (intolerance)  . Penicillins Itching and Other (See Comments)    REACTION: ITCHING HANDS Has patient had a PCN reaction causing immediate rash, facial/tongue/throat swelling, SOB or lightheadedness with hypotension: No Has patient had a PCN reaction causing severe rash involving mucus membranes or skin necrosis: No Has patient had a PCN reaction that required hospitalization Yes Has patient had a PCN reaction occurring within the last 10 years: No If all of the above answers are "NO", then may proceed with Cephalosporin use.   . Sulfa Drugs Cross Reactors Hives  . Budesonide-Formoterol Fumarate Rash  . Contrast Media [Iodinated Diagnostic Agents] Other (See Comments)    Reaction unknown  . Ioxaglate Other (See Comments)    Reaction unknown  . Metrizamide Other (See Comments)    Reaction unknown  . Sulfamethoxazole Rash    Social History   Social History  . Marital status: Married    Spouse name: [name]  . Number of children: 3  . Years of education: N/A   Occupational History  . Business owner, Press photographer, retired    Social History Main Topics  . Smoking status: Former Research scientist (life sciences)  . Smokeless tobacco: Never Used  . Alcohol use No  . Drug use: No  . Sexual activity: No   Other Topics Concern  . Not on file   Social History Narrative   Married.  Lives with his wife.  Ambulates with a walker and a cane.     Review of Systems: General: negative for chills, fever, night sweats or weight changes.  Cardiovascular: negative for chest pain,  dyspnea on exertion, edema, orthopnea, palpitations, paroxysmal nocturnal dyspnea or shortness of breath Dermatological: negative for rash Respiratory: negative for cough or wheezing Urologic: negative for hematuria Abdominal: negative for nausea, vomiting, diarrhea, bright red blood per rectum, melena, or hematemesis Neurologic: negative for visual changes, syncope, or dizziness All other systems reviewed and are otherwise negative except as noted above.    Blood pressure 116/72, pulse 72, height '5\' 6"'$  (1.676 m), weight 98 lb (44.5 kg).  General appearance: alert and no distress Neck: no adenopathy, no carotid bruit, no JVD, supple, symmetrical, trachea midline and thyroid not enlarged, symmetric, no tenderness/mass/nodules Lungs: clear to auscultation bilaterally Heart: regular rate and rhythm, S1, S2 normal, no murmur, click, rub or gallop Extremities: extremities normal, atraumatic, no cyanosis or edema  EKG not performed today  ASSESSMENT AND PLAN:   Essential hypertension, benign  History of hypertension blood pressure measured today at 116/72. He is on low-dose amlodipine and metoprolol. Continue current meds at current dosing  Hyperlipidemia History of hyperlipidemia on statin therapy with recent lipid profile performed 08/15/16 revealing a Total cholesterol 95, LDL 45 and HDL of 35  CAD (coronary artery disease) History of CAD status post remote LAD stenting in 1996 in [place]. I catheterized him 11/27/2001 revealing a patent LAD stent with mild in-stent restenosis. The Myoview performed March 2008 was nonischemic. He denies chest pain or shortness of breath.  CHF exacerbation [facility]) Mr. Kissoon was recently hospitalized for respiratory distress. His BNP was moderately elevated he was diuresed. He was placed on antibiotics. His echo performed 08/14/16 revealed an EF of 50% with grade 2 diastolic dysfunction and mild aortic stenosis. He is somewhat dry on exam today. I'm going  to cut back his Lasix from twice a day to once a day.      Lorretta Harp MD FACP,FACC,FAHA, [facility] 08/28/2016 2:13 PM

## 2016-08-28 NOTE — Assessment & Plan Note (Signed)
History of CAD status post remote LAD stenting in 1996 in [place]. I catheterized him 11/27/2001 revealing a patent LAD stent with mild in-stent restenosis. The Myoview performed March 2008 was nonischemic. He denies chest pain or shortness of breath.

## 2016-08-31 ENCOUNTER — Encounter (HOSPITAL_COMMUNITY): Payer: Self-pay | Admitting: Emergency Medicine

## 2016-08-31 ENCOUNTER — Emergency Department (HOSPITAL_COMMUNITY): Payer: Medicare Other

## 2016-08-31 ENCOUNTER — Inpatient Hospital Stay (HOSPITAL_COMMUNITY)
Admission: EM | Admit: 2016-08-31 | Discharge: 2016-09-02 | DRG: 189 | Disposition: A | Discharge status: Home Health | Payer: Medicare Other | Attending: Internal Medicine | Admitting: Internal Medicine

## 2016-08-31 DIAGNOSIS — I5042 Chronic combined systolic (congestive) and diastolic (congestive) heart failure: Secondary | ICD-10-CM | POA: Diagnosis present

## 2016-08-31 DIAGNOSIS — C7800 Secondary malignant neoplasm of unspecified lung: Secondary | ICD-10-CM | POA: Diagnosis present

## 2016-08-31 DIAGNOSIS — Z955 Presence of coronary angioplasty implant and graft: Secondary | ICD-10-CM

## 2016-08-31 DIAGNOSIS — E1122 Type 2 diabetes mellitus with diabetic chronic kidney disease: Secondary | ICD-10-CM | POA: Diagnosis present

## 2016-08-31 DIAGNOSIS — J189 Pneumonia, unspecified organism: Secondary | ICD-10-CM | POA: Diagnosis present

## 2016-08-31 DIAGNOSIS — Z87891 Personal history of nicotine dependence: Secondary | ICD-10-CM

## 2016-08-31 DIAGNOSIS — Z8249 Family history of ischemic heart disease and other diseases of the circulatory system: Secondary | ICD-10-CM

## 2016-08-31 DIAGNOSIS — Z91048 Other nonmedicinal substance allergy status: Secondary | ICD-10-CM

## 2016-08-31 DIAGNOSIS — R778 Other specified abnormalities of plasma proteins: Secondary | ICD-10-CM | POA: Diagnosis present

## 2016-08-31 DIAGNOSIS — J9621 Acute and chronic respiratory failure with hypoxia: Principal | ICD-10-CM | POA: Diagnosis present

## 2016-08-31 DIAGNOSIS — I248 Other forms of acute ischemic heart disease: Secondary | ICD-10-CM | POA: Diagnosis present

## 2016-08-31 DIAGNOSIS — N179 Acute kidney failure, unspecified: Secondary | ICD-10-CM | POA: Diagnosis present

## 2016-08-31 DIAGNOSIS — I130 Hypertensive heart and chronic kidney disease with heart failure and stage 1 through stage 4 chronic kidney disease, or unspecified chronic kidney disease: Secondary | ICD-10-CM | POA: Diagnosis present

## 2016-08-31 DIAGNOSIS — I2510 Atherosclerotic heart disease of native coronary artery without angina pectoris: Secondary | ICD-10-CM | POA: Diagnosis present

## 2016-08-31 DIAGNOSIS — D72829 Elevated white blood cell count, unspecified: Secondary | ICD-10-CM | POA: Diagnosis present

## 2016-08-31 DIAGNOSIS — L89153 Pressure ulcer of sacral region, stage 3: Secondary | ICD-10-CM | POA: Diagnosis present

## 2016-08-31 DIAGNOSIS — Z66 Do not resuscitate: Secondary | ICD-10-CM | POA: Diagnosis present

## 2016-08-31 DIAGNOSIS — J440 Chronic obstructive pulmonary disease with acute lower respiratory infection: Secondary | ICD-10-CM | POA: Diagnosis present

## 2016-08-31 DIAGNOSIS — Z515 Encounter for palliative care: Secondary | ICD-10-CM | POA: Diagnosis present

## 2016-08-31 DIAGNOSIS — E1121 Type 2 diabetes mellitus with diabetic nephropathy: Secondary | ICD-10-CM | POA: Diagnosis present

## 2016-08-31 DIAGNOSIS — I10 Essential (primary) hypertension: Secondary | ICD-10-CM | POA: Diagnosis present

## 2016-08-31 DIAGNOSIS — Z8673 Personal history of transient ischemic attack (TIA), and cerebral infarction without residual deficits: Secondary | ICD-10-CM

## 2016-08-31 DIAGNOSIS — R7989 Other specified abnormal findings of blood chemistry: Secondary | ICD-10-CM

## 2016-08-31 DIAGNOSIS — J9601 Acute respiratory failure with hypoxia: Secondary | ICD-10-CM | POA: Diagnosis not present

## 2016-08-31 DIAGNOSIS — E43 Unspecified severe protein-calorie malnutrition: Secondary | ICD-10-CM | POA: Diagnosis present

## 2016-08-31 DIAGNOSIS — Z8052 Family history of malignant neoplasm of bladder: Secondary | ICD-10-CM

## 2016-08-31 DIAGNOSIS — N183 Chronic kidney disease, stage 3 (moderate): Secondary | ICD-10-CM | POA: Diagnosis present

## 2016-08-31 DIAGNOSIS — Z888 Allergy status to other drugs, medicaments and biological substances status: Secondary | ICD-10-CM

## 2016-08-31 DIAGNOSIS — Z79899 Other long term (current) drug therapy: Secondary | ICD-10-CM

## 2016-08-31 DIAGNOSIS — C61 Malignant neoplasm of prostate: Secondary | ICD-10-CM | POA: Diagnosis present

## 2016-08-31 DIAGNOSIS — Z9842 Cataract extraction status, left eye: Secondary | ICD-10-CM

## 2016-08-31 DIAGNOSIS — L8993 Pressure ulcer of unspecified site, stage 3: Secondary | ICD-10-CM | POA: Diagnosis present

## 2016-08-31 DIAGNOSIS — Z9841 Cataract extraction status, right eye: Secondary | ICD-10-CM

## 2016-08-31 DIAGNOSIS — Z7982 Long term (current) use of aspirin: Secondary | ICD-10-CM

## 2016-08-31 DIAGNOSIS — Z794 Long term (current) use of insulin: Secondary | ICD-10-CM

## 2016-08-31 DIAGNOSIS — E785 Hyperlipidemia, unspecified: Secondary | ICD-10-CM | POA: Diagnosis present

## 2016-08-31 DIAGNOSIS — Z8551 Personal history of malignant neoplasm of bladder: Secondary | ICD-10-CM

## 2016-08-31 DIAGNOSIS — Z91041 Radiographic dye allergy status: Secondary | ICD-10-CM

## 2016-08-31 DIAGNOSIS — Z681 Body mass index (BMI) 19 or less, adult: Secondary | ICD-10-CM

## 2016-08-31 DIAGNOSIS — R0902 Hypoxemia: Secondary | ICD-10-CM | POA: Diagnosis not present

## 2016-08-31 DIAGNOSIS — Z961 Presence of intraocular lens: Secondary | ICD-10-CM | POA: Diagnosis present

## 2016-08-31 DIAGNOSIS — Z9981 Dependence on supplemental oxygen: Secondary | ICD-10-CM

## 2016-08-31 HISTORY — DX: Pneumonia, unspecified organism: J18.9

## 2016-08-31 LAB — COMPREHENSIVE METABOLIC PANEL
ALT: 27 U/L (ref 17–63)
ANION GAP: 11 (ref 5–15)
AST: 42 U/L — AB (ref 15–41)
Albumin: 2.6 g/dL — ABNORMAL LOW (ref 3.5–5.0)
Alkaline Phosphatase: 187 U/L — ABNORMAL HIGH (ref 38–126)
BILIRUBIN TOTAL: 1.1 mg/dL (ref 0.3–1.2)
BUN: 64 mg/dL — AB (ref 6–20)
CHLORIDE: 106 mmol/L (ref 101–111)
CO2: 24 mmol/L (ref 22–32)
Calcium: 9.2 mg/dL (ref 8.9–10.3)
Creatinine, Ser: 1.49 mg/dL — ABNORMAL HIGH (ref 0.61–1.24)
GFR, EST AFRICAN AMERICAN: 45 mL/min — AB (ref >60)
GFR, EST NON AFRICAN AMERICAN: 39 mL/min — AB (ref >60)
Glucose, Bld: 334 mg/dL — ABNORMAL HIGH (ref 65–99)
POTASSIUM: 4.1 mmol/L (ref 3.5–5.1)
Sodium: 141 mmol/L (ref 135–145)
TOTAL PROTEIN: 7.8 g/dL (ref 6.5–8.1)

## 2016-08-31 LAB — GLUCOSE, CAPILLARY
GLUCOSE-CAPILLARY: 332 mg/dL — AB (ref 65–99)
Glucose-Capillary: 334 mg/dL — ABNORMAL HIGH (ref 65–99)

## 2016-08-31 LAB — CBC WITH DIFFERENTIAL/PLATELET
Basophils Absolute: 0 10*3/uL (ref 0.0–0.1)
Basophils Relative: 0 %
EOS ABS: 0.1 10*3/uL (ref 0.0–0.7)
EOS PCT: 1 %
HCT: 31.1 % — ABNORMAL LOW (ref 39.0–52.0)
Hemoglobin: 10.5 g/dL — ABNORMAL LOW (ref 13.0–17.0)
LYMPHS ABS: 0.8 10*3/uL (ref 0.7–4.0)
LYMPHS PCT: 8 %
MCH: 34.8 pg — AB (ref 26.0–34.0)
MCHC: 33.8 g/dL (ref 30.0–36.0)
MCV: 103 fL — AB (ref 78.0–100.0)
MONO ABS: 0.6 10*3/uL (ref 0.1–1.0)
MONOS PCT: 5 %
Neutro Abs: 9.1 10*3/uL — ABNORMAL HIGH (ref 1.7–7.7)
Neutrophils Relative %: 86 %
PLATELETS: 294 10*3/uL (ref 150–400)
RBC: 3.02 MIL/uL — ABNORMAL LOW (ref 4.22–5.81)
RDW: 16.7 % — AB (ref 11.5–15.5)
WBC: 10.6 10*3/uL — AB (ref 4.0–10.5)

## 2016-08-31 LAB — BRAIN NATRIURETIC PEPTIDE: B NATRIURETIC PEPTIDE 5: 398.2 pg/mL — AB (ref 0.0–100.0)

## 2016-08-31 LAB — TROPONIN I: TROPONIN I: 0.29 ng/mL — AB (ref <0.03)

## 2016-08-31 MED ORDER — ASPIRIN 81 MG PO CHEW
81.0000 mg | CHEWABLE_TABLET | Freq: Every day | ORAL | Status: DC
  Administered 2016-09-01 – 2016-09-02 (×2): 81 mg via ORAL
  Filled 2016-08-31 (×2): qty 1

## 2016-08-31 MED ORDER — ONDANSETRON HCL 4 MG/2ML IJ SOLN: 4.0000 mg | Freq: Four times a day (QID) | INTRAMUSCULAR | Status: DC | PRN

## 2016-08-31 MED ORDER — MORPHINE SULFATE (PF) 2 MG/ML IV SOLN
1.0000 mg | INTRAVENOUS | Status: DC | PRN
  Administered 2016-09-01 (×2): 1 mg via INTRAVENOUS
  Filled 2016-08-31 (×2): qty 1

## 2016-08-31 MED ORDER — NITROGLYCERIN 2 % TD OINT
0.5000 [in_us] | TOPICAL_OINTMENT | Freq: Four times a day (QID) | TRANSDERMAL | Status: DC
  Administered 2016-08-31 – 2016-09-02 (×7): 0.5 [in_us] via TOPICAL
  Filled 2016-08-31: qty 30

## 2016-08-31 MED ORDER — INSULIN GLARGINE 100 UNIT/ML ~~LOC~~ SOLN
8.0000 [IU] | Freq: Every day | SUBCUTANEOUS | Status: DC
  Administered 2016-08-31 – 2016-09-01 (×2): 8 [IU] via SUBCUTANEOUS
  Filled 2016-08-31 (×3): qty 0.08

## 2016-08-31 MED ORDER — INSULIN ASPART 100 UNIT/ML ~~LOC~~ SOLN
0.0000 [IU] | Freq: Three times a day (TID) | SUBCUTANEOUS | Status: DC
  Administered 2016-09-01 (×2): 2 [IU] via SUBCUTANEOUS
  Administered 2016-09-01: 1 [IU] via SUBCUTANEOUS
  Administered 2016-09-02: 2 [IU] via SUBCUTANEOUS

## 2016-08-31 MED ORDER — PANCRELIPASE (LIP-PROT-AMYL) 12000-38000 UNITS PO CPEP
12000.0000 [IU] | ORAL_CAPSULE | Freq: Three times a day (TID) | ORAL | Status: DC
  Administered 2016-09-01 – 2016-09-02 (×5): 12000 [IU] via ORAL
  Filled 2016-08-31 (×5): qty 1

## 2016-08-31 MED ORDER — LATANOPROST 0.005 % OP SOLN
1.0000 [drp] | Freq: Every day | OPHTHALMIC | Status: DC
  Administered 2016-08-31 – 2016-09-01 (×2): 1 [drp] via OPHTHALMIC
  Filled 2016-08-31: qty 2.5

## 2016-08-31 MED ORDER — ACETAMINOPHEN 325 MG PO TABS
650.0000 mg | ORAL_TABLET | ORAL | Status: DC | PRN
  Administered 2016-09-01: 650 mg via ORAL
  Filled 2016-08-31: qty 2

## 2016-08-31 MED ORDER — PANCRELIPASE (LIP-PROT-AMYL) 12000-38000 UNITS PO CPEP: 5000.0000 [IU] | ORAL_CAPSULE | Freq: Three times a day (TID) | ORAL | Status: DC

## 2016-08-31 MED ORDER — ONDANSETRON HCL 4 MG PO TABS: 4.0000 mg | ORAL_TABLET | Freq: Four times a day (QID) | ORAL | Status: DC | PRN

## 2016-08-31 MED ORDER — CYCLOSPORINE 0.05 % OP EMUL
1.0000 [drp] | Freq: Two times a day (BID) | OPHTHALMIC | Status: DC
  Administered 2016-08-31 – 2016-09-02 (×4): 1 [drp] via OPHTHALMIC
  Filled 2016-08-31 (×5): qty 1

## 2016-08-31 MED ORDER — METOPROLOL SUCCINATE ER 25 MG PO TB24
12.5000 mg | ORAL_TABLET | Freq: Every day | ORAL | Status: DC
  Administered 2016-09-01 – 2016-09-02 (×2): 12.5 mg via ORAL
  Filled 2016-08-31 (×2): qty 1

## 2016-08-31 MED ORDER — INSULIN ASPART 100 UNIT/ML ~~LOC~~ SOLN
0.0000 [IU] | Freq: Every day | SUBCUTANEOUS | Status: DC
  Administered 2016-09-01: 4 [IU] via SUBCUTANEOUS

## 2016-08-31 MED ORDER — IPRATROPIUM BROMIDE 0.02 % IN SOLN: 0.5000 mg | RESPIRATORY_TRACT | Status: DC | PRN

## 2016-08-31 MED ORDER — SIMVASTATIN 10 MG PO TABS
10.0000 mg | ORAL_TABLET | ORAL | Status: DC
  Administered 2016-09-01: 10 mg via ORAL
  Filled 2016-08-31: qty 1

## 2016-08-31 NOTE — ED Notes (Signed)
Bed: WA17 Expected date:  Expected time:  Means of arrival:  Comments: EMS- end-stage CA, sats 80%

## 2016-08-31 NOTE — ED Triage Notes (Addendum)
Pt from home via EMS- Per EMS, pt c/o SOB with sats in 70% in field. EMS reports that pt intially refused transport but then agreed. EMS sts that pt had constricted lung fields and was moving little air upon assessment. Pt is allergic to albuterol but received 1 mg atrovent via neb. Pt also received 125 mg solumedrol, 2 gm mag IV en route. Pt did not have relief from tx. Pt is end stage lung CA pt. Pt is A&O x2 and in NAD

## 2016-08-31 NOTE — H&P (Signed)
History and Physical    Guy [name]:[id number] DOB: Mar 23, 1920 DOA: 08/31/2016  PCP: [name], MD   Patient coming from: Home  Chief Complaint: Shortness of breath, hypoxia  HPI: Guy [name] is a 82 y.o. gentleman with a history of Metastatic prostate cancer, CAD S/P stent, Combined systolic and diastolic heart failure, HTN, DM, COPD, and stage III sacral ulcer who was just admitted from 11/22-11/26 for HCAP and CHF exacerbation.  He was on broad spectrum IV antibiotics, which were discontinued at time of discharge because the patient was discharged home with [facility] support.  Apparently, his wife had reservations about this decision post-discharge and wanted her husband evaluated by his cardiologist again.  The patient saw Dr. Gwenlyn Found in clinic on 11/29.  He appeared dry on exam, and his lasix dose was reduced.  Otherwise, there were no new recommendations for further invasive testing and changes to his medical therapy.    Reportedly, the patient was already on 5L [place] at home.  However, his wife called 8 today for respiratory distress, and he was found to have O2 sats in the 70's in the field.  He received atrovent nebulizer treatments (allergy to albuterol), IV solumedrol 125 mg, and '2mg'$  of IV magnesium.  He was placed on a NRB which has been weaned to a Venti mask.  ED Course: Chest xray shows improvement in previously identified LLL infiltrate.  EKG shows NSR.  Troponin 0.29, which is slightly up from baseline.  The patient has evidence of progressive AKI.  He now appears comfortable on increased oxygen, but he is complaining of chest pain and shortness of breath.  He denies light-headedness and dizziness.  He is awake and knows that he is at [facility].  Per the ED attending, plan of care is keep the patient comfortable and facilitate appropriate discharge plan with hospice support.  I was unable to reach the patient's wife at time of admission.  Review of Systems: As per HPI  otherwise 10 point review of systems negative.    Past Medical History:  Diagnosis Date  . Arthritis   . Bladder cancer ([place])   . CAD (coronary artery disease) 1996   stent to the LAD   . COPD (chronic obstructive pulmonary disease) ([place])   . Decreased libido   . Depression   . Diabetes mellitus without complication ([place])   . ED (erectile dysfunction)   . Ejection fraction   . Gastric polyposis   . GERD (gastroesophageal reflux disease)   . Hepatitis A   . History of blood transfusion    1946  . Hyperlipidemia   . Hyperlipidemia   . Hypertension   . OA (osteoarthritis)   . Osteoporosis   . Prostate cancer ([place] [name])    S/P prostatectomy; Lung metastasis  . Sciatica   . Stroke ([place])   . Type 2 diabetes mellitus ([place])   . Ulcer of left heel ([place])   . Urge incontinence   . Vertigo     Past Surgical History:  Procedure Laterality Date  . APPENDECTOMY    . BLADDER SURGERY    . CARDIAC CATHETERIZATION  11/27/2001   patent stent with mild in-stent restenosis.  . CARPAL TUNNEL RELEASE    . CATARACT EXTRACTION W/ INTRAOCULAR LENS  IMPLANT, BILATERAL  1998  . CORONARY ANGIOPLASTY  1996   stenting to the LAD in [place].   [name] FINGER SURGERY     Left and right  . HERNIA REPAIR    .  Left hip surgery     Donated bone for bone graft to arm  . MIDDLE EAR SURGERY    . myoview  06/12/2009   Persantine myoview EF 76%; Normal myoview  . PENILE PROSTHESIS IMPLANT     S/P removal and re-implantation of new prosthesis  . PROSTATECTOMY    . Right arm bone graft     Pathological fracture  . URINARY SPHINCTER IMPLANT    . Urinary sphincter implant revision       reports that he has quit smoking. He has never used smokeless tobacco. He reports that he does not drink alcohol or use drugs.  Allergies  Allergen Reactions  . Albuterol Sulfate Hfa [Kdc:Albuterol] Shortness Of Breath and Swelling  . Adhesive [Tape] Other (See Comments)    REACTION: SKIN BLISTERS  . Albuterol Swelling     Per pt., side of his neck began to swell with nebulized Albuterol in doctor's office.  . Lactose Other (See Comments)    Other reaction(s): GI Upset (intolerance)  . Penicillins Itching and Other (See Comments)    REACTION: ITCHING HANDS Has patient had a PCN reaction causing immediate rash, facial/tongue/throat swelling, SOB or lightheadedness with hypotension: No Has patient had a PCN reaction causing severe rash involving mucus membranes or skin necrosis: No Has patient had a PCN reaction that required hospitalization Yes Has patient had a PCN reaction occurring within the last 10 years: No If all of the above answers are "NO", then may proceed with Cephalosporin use.   . Sulfa Drugs Cross Reactors Hives  . Budesonide-Formoterol Fumarate Rash  . Contrast Media [Iodinated Diagnostic Agents] Other (See Comments)    Reaction unknown  . Ioxaglate Other (See Comments)    Reaction unknown  . Metrizamide Other (See Comments)    Reaction unknown  . Sulfamethoxazole Rash    Family History  Problem Relation Age of Onset  . Heart failure Mother   . Heart disease Mother   . Bladder Cancer Father   . Pancreatic cancer Sister   . Lung cancer Sister   . Breast cancer Daughter   . Liver disease Son      Prior to Admission medications   Medication Sig Start Date End Date Taking? Authorizing Provider  acetaminophen (TYLENOL) 325 MG tablet Take 2 tablets (650 mg total) by mouth every 4 (four) hours as needed for headache or mild pain. 08/25/16  Yes Reyne Dumas, MD  aspirin 81 MG tablet Take 81 mg by mouth daily. For heart theraphy   Yes Historical Provider, MD  Calcium Carbonate-Vitamin D (CALCIUM 600+D HIGH POTENCY) 600-400 MG-UNIT per tablet Take 1 tablet by mouth 2 (two) times daily.     Yes Historical Provider, MD  CRANBERRY CONCENTRATE PO Take 1 capsule by mouth 3 (three) times daily.    Yes Historical Provider, MD  cycloSPORINE (RESTASIS) 0.05 % ophthalmic emulsion Place 1 drop into  both eyes 2 (two) times daily. 08/18/16  Yes Eber Jones, MD  furosemide (LASIX) 20 MG tablet Take 1 tablet (20 mg total) by mouth 2 (two) times daily. 08/25/16  Yes Reyne Dumas, MD  insulin glargine (LANTUS) 100 UNIT/ML injection Inject 0.08 mLs (8 Units total) into the skin at bedtime. 08/18/16  Yes Eber Jones, MD  latanoprost (XALATAN) 0.005 % ophthalmic solution Place 1 drop into the right eye at bedtime. 08/18/16  Yes Eber Jones, MD  metoprolol succinate (TOPROL-XL) 25 MG 24 hr tablet Take 0.5 tablets (12.5 mg total) by mouth daily.  08/19/16  Yes Eber Jones, MD  Multiple Vitamins-Minerals (HAIR/SKIN/NAILS) TABS Take 1 tablet by mouth 3 (three) times daily.   Yes Historical Provider, MD  nitroGLYCERIN (NITROSTAT) 0.4 MG SL tablet Place 0.4 mg under the tongue every 5 (five) minutes as needed for chest pain.   Yes Historical Provider, MD  omeprazole (PRILOSEC) 20 MG capsule Take 20 mg by mouth every other day. For GERD   Yes Historical Provider, MD  Pancrelipase, Lip-Prot-Amyl, 5000 units CPEP Take 5,000 Units by mouth 3 (three) times daily.   Yes Historical Provider, MD  potassium chloride SA (K-DUR,KLOR-CON) 20 MEQ tablet Take 1 tablet (20 mEq total) by mouth daily. 08/25/16  Yes Reyne Dumas, MD  senna-docusate (SENOKOT S) 8.6-50 MG per tablet Take 1 tablet by mouth at bedtime. For constipation 05/24/15  Yes Ripudeep Krystal Eaton, MD  simvastatin (ZOCOR) 20 MG tablet Take 10 mg by mouth every other day. Take 1/2 tablet =10 mg for HLD.   Yes Historical Provider, MD  Amino Acids-Protein Hydrolys (FEEDING SUPPLEMENT, PRO-STAT SUGAR FREE 64,) LIQD Take 30 mLs by mouth daily. 08/19/16   Eber Jones, MD    Physical Exam: Vitals:   08/31/16 1825 08/31/16 1855 08/31/16 1925 08/31/16 2025  BP: 149/83 139/85 146/93 155/75  Pulse: 80 84 88 89  Resp: '22 23 24 25  '$ Temp:      TempSrc:      SpO2: 100% 100% 100% 100%      Constitutional: NAD, calm,  comfortable, chronically ill appearing but he is not decompensating. Vitals:   08/31/16 1825 08/31/16 1855 08/31/16 1925 08/31/16 2025  BP: 149/83 139/85 146/93 155/75  Pulse: 80 84 88 89  Resp: '22 23 24 25  '$ Temp:      TempSrc:      SpO2: 100% 100% 100% 100%   Eyes: PERRL, lids and conjunctivae normal ENMT: Mucous membranes are DRY. Edentulous. Neck: Very thin, prominent bones/musculature Respiratory: Diminished bilaterally.  No wheezing or ronchi.  Normal respiratory effort. No accessory muscle use.  Cardiovascular: Normal rate, regular rhythm, no murmurs / rubs / gallops. No extremity edema. 2+ pedal pulses. GI: abdomen is soft and compressible.  No distention.  No tenderness.  Bowel sounds are present. Musculoskeletal:  No joint deformity in upper and lower extremities. Good ROM, no contractures. Normal muscle tone.  Skin: Warm and dry, multiple bruises present. Neurologic: no focal deficits. Psychiatric: Awake and alert.  Appropriate mood.    Labs on Admission: I have personally reviewed following labs and imaging studies  CBC:  Recent Labs Lab 08/31/16 1800  WBC 10.6*  NEUTROABS 9.1*  HGB 10.5*  HCT 31.1*  MCV 103.0*  PLT 676   Basic Metabolic Panel:  Recent Labs Lab 08/25/16 0543 08/31/16 1845  NA 139 141  K 4.1 4.1  CL 102 106  CO2 29 24  GLUCOSE 164* 334*  BUN 46* 64*  CREATININE 1.38* 1.49*  CALCIUM 9.1 9.2   GFR: Estimated Creatinine Clearance: 19.5 mL/min (by C-G formula based on SCr of 1.49 mg/dL (H)). Liver Function Tests:  Recent Labs Lab 08/31/16 1845  AST 42*  ALT 27  ALKPHOS 187*  BILITOT 1.1  PROT 7.8  ALBUMIN 2.6*   Cardiac Enzymes:  Recent Labs Lab 08/31/16 1845  TROPONINI 0.29*   CBG:  Recent Labs Lab 08/24/16 2209 08/25/16 0555 08/25/16 1140 08/25/16 1710 08/25/16 2015  GLUCAP 154* 162* 137* 151* 193*    Radiological Exams on Admission: Dg Chest 2 View  Result  Date: 08/31/2016 CLINICAL DATA:  SOB and  dyspnea. Pt is on non-rebreather mask on 10 L O2. EMS sts that pt had constricted lung fields and was moving little air upon assessment. Pt is end stage lung CA pt. Former smoker. H/o diabetes, COPD, HTN, Stroke. EXAM: CHEST  2 VIEW COMPARISON:  08/23/2016 FINDINGS: Cardiac silhouette is borderline enlarged. There is opacity at the left lung base which silhouettes the left hemidiaphragm. This is decreased when compared to the prior study. There may be a minimal associated left pleural effusion. No right pleural effusion. No new areas of lung consolidation.  No pneumothorax. No mediastinal or hilar masses. Skeletal structures are diffusely demineralized. There multiple compression fractures of the visualized spine. IMPRESSION: Left lower lobe consolidation has improved when compared the prior study although has not resolved. This is consistent with improved pneumonia. No new abnormalities. Electronically Signed   By: Lajean Manes M.D.   On: 08/31/2016 17:56    EKG: Independently reviewed. NSR.  No acute ST segment changes.  Assessment/Plan Principal Problem:   Hypoxia Active Problems:   Prostate cancer metastatic to multiple sites [facility])   Essential hypertension, benign   CAD (coronary artery disease)   Type 2 diabetes mellitus without complication (HCC)   Pressure ulcer, stage III ([place])   HCAP (healthcare-associated pneumonia)   Chronic CHF ([place])   Acute respiratory failure (HCC)      Acute hypoxic respiratory failure, likely multifactorial (recent pneumonia, CHF) --Improved on Venti mask --Supportive care, conservative measures --Respiratory therapy --Atrovent nebs q4h prn  Elevated troponin likely representing demand ischemia in the setting of profound hypoxia --Will not order serial troponins for now since the focus of care is comfort --Add 1/2 inch nitropaste to chest wall --Continue baby aspirin, beta blocker, statin for now --IV morphine prn for pain or shortness of  breath  DM --Will continue long acting insulin for now with low dose sliding scale since the patient is still eating --Aspiration precautions  CHF, appears compensated if not DRY at this point --HOLD lasix and monitor  Chronic pressure ulcer, stage III, sacrum, present on admission.  Of note, patient also has a dressing to his right heel. --Monitor  DVT prophylaxis: SCDs Code Status: DNR Family Communication: Patient alone in the ED at time of admission.  His wife did not answer at either number listed for her after multiple attempts. Disposition Plan: Resume hospice support. Consults called: NONE.   Admission status: Place in observation, med surg   TIME SPENT: 60 minutes   Eber Jones MD [facility] Pager [phone]  If 7PM-7AM, please contact night-coverage [email or web address] Password [facility]  08/31/2016, 9:18 PM

## 2016-08-31 NOTE — ED Notes (Signed)
Report attempted was told nurse was unavailable at this time and would call back for report.

## 2016-08-31 NOTE — ED Provider Notes (Signed)
[facility] DEPT Provider Note   CSN: [id number] Arrival date & time: 08/31/16  1653     History   Chief Complaint Chief Complaint  Patient presents with  . Shortness of Breath    HPI Guy [name] is a 82 y.o. male.  HPI  Patient presents in respiratory distress. Patient is wearing a supplemental oxygen mask, cannot answer questions due to dyspnea, level V caveat   Past Medical History:  Diagnosis Date  . Arthritis   . Bladder cancer ([place])   . CAD (coronary artery disease) 1996   stent to the LAD   . COPD (chronic obstructive pulmonary disease) ([place])   . Decreased libido   . Depression   . Diabetes mellitus without complication ([place])   . ED (erectile dysfunction)   . Ejection fraction   . Gastric polyposis   . GERD (gastroesophageal reflux disease)   . Hepatitis A   . History of blood transfusion    1946  . Hyperlipidemia   . Hyperlipidemia   . Hypertension   . OA (osteoarthritis)   . Osteoporosis   . Prostate cancer ([place])    S/P prostatectomy; Lung metastasis  . Sciatica   . Stroke ([place])   . Type 2 diabetes mellitus ([place])   . Ulcer of left heel ([place])   . Urge incontinence   . Vertigo     Patient Active Problem List   Diagnosis Date Noted  . Palliative care encounter   . HCAP (healthcare-associated pneumonia)   . SOB (shortness of breath) 08/22/2016  . Acute on chronic respiratory failure with hypoxia ([place]) 08/15/2016  . Acute on chronic combined systolic and diastolic congestive heart failure ([place]) 08/15/2016  . CHF exacerbation ([place]) 08/15/2016  . Pressure ulcer, stage III ([place]) 08/15/2016  . CAP (community acquired pneumonia) 08/15/2016  . Protein-calorie malnutrition, severe ([place]) 05/23/2015  . Proctitis 05/21/2015  . Ulcer of left heel ([place]) 03/14/2015  . Yeast infection 04/22/2014  . Type 2 diabetes mellitus without complication ([place]) 69/67/8938  . Coronary artery disease involving native coronary artery of native heart without angina  pectoris 04/13/2014  . Hypokalemia 04/08/2014  . Generalized weakness 04/08/2014  . GERD (gastroesophageal reflux disease) 04/08/2014  . Depression 04/08/2014  . Allergic rhinitis 04/08/2014  . RBBB 04/04/2014  . Malnutrition of moderate degree ([place]) 04/04/2014  . Hypoglycemia 04/04/2014  . Acute kidney injury ([place]) 04/04/2014  . Thrombocytopenia, unspecified 04/03/2014  . DIC (disseminated intravascular coagulation) ([place]) 04/03/2014  . Elevated troponin 04/03/2014  . Prolonged Q-T interval on ECG 04/03/2014  . Ejection fraction   . Sepsis ([place]) 04/02/2014  . UTI (lower urinary tract infection) 04/02/2014  . Chest pain 01/28/2014  . Pain 12/31/2013  . Lumbar transverse process fracture ([place]) 12/29/2013  . Back pain 12/29/2013  . Fall 12/29/2013  . Thoracic compression fracture ([place]) 12/29/2013  . Anemia of chronic disease 04/29/2013  . Urinary incontinence 04/25/2013  . CAD (coronary artery disease) 02/18/2013  . Acute venous embolism and thrombosis of deep vessels of proximal lower extremity ([place]) 02/18/2013  . Hyperlipidemia   . Essential hypertension, benign 12/28/2012  . Prostate cancer metastatic to multiple sites ([place]) 11/16/2012  . Dehydration 11/16/2012  . Leukocytosis 11/16/2012    Past Surgical History:  Procedure Laterality Date  . APPENDECTOMY    . BLADDER SURGERY    . CARDIAC CATHETERIZATION  11/27/2001   patent stent with mild in-stent restenosis.  . CARPAL TUNNEL RELEASE    . CATARACT EXTRACTION W/ INTRAOCULAR LENS  IMPLANT, BILATERAL  1998  . CORONARY ANGIOPLASTY  1996   stenting to the LAD in [place].   [name] FINGER SURGERY     Left and right  . HERNIA REPAIR    . Left hip surgery     Donated bone for bone graft to arm  . MIDDLE EAR SURGERY    . myoview  06/12/2009   Persantine myoview EF 76%; Normal myoview  . PENILE PROSTHESIS IMPLANT     S/P removal and re-implantation of new prosthesis  . PROSTATECTOMY    . Right arm bone graft      Pathological fracture  . URINARY SPHINCTER IMPLANT    . Urinary sphincter implant revision         Home Medications    Prior to Admission medications   Medication Sig Start Date End Date Taking? Authorizing Provider  acetaminophen (TYLENOL) 325 MG tablet Take 2 tablets (650 mg total) by mouth every 4 (four) hours as needed for headache or mild pain. 08/25/16   Reyne Dumas, MD  Amino Acids-Protein Hydrolys (FEEDING SUPPLEMENT, PRO-STAT SUGAR FREE 64,) LIQD Take 30 mLs by mouth daily. 08/19/16   Eber Jones, MD  amLODipine (NORVASC) 2.5 MG tablet Take 1 tablet (2.5 mg total) by mouth daily. 08/19/16   Eber Jones, MD  aspirin 81 MG tablet Take 81 mg by mouth daily. For heart theraphy    Historical Provider, MD  bicalutamide (CASODEX) 50 MG tablet Take 50 mg by mouth daily.    Historical Provider, MD  Calcium Carbonate-Vitamin D (CALCIUM 600+D HIGH POTENCY) 600-400 MG-UNIT per tablet Take 1 tablet by mouth 2 (two) times daily.      Historical Provider, MD  CRANBERRY CONCENTRATE PO Take 1 capsule by mouth 3 (three) times daily.     Historical Provider, MD  cycloSPORINE (RESTASIS) 0.05 % ophthalmic emulsion Place 1 drop into both eyes 2 (two) times daily. 08/18/16   Eber Jones, MD  furosemide (LASIX) 20 MG tablet Take 1 tablet (20 mg total) by mouth 2 (two) times daily. 08/25/16   Reyne Dumas, MD  insulin glargine (LANTUS) 100 UNIT/ML injection Inject 0.08 mLs (8 Units total) into the skin at bedtime. 08/18/16   Eber Jones, MD  latanoprost (XALATAN) 0.005 % ophthalmic solution Place 1 drop into the right eye at bedtime. 08/18/16   Eber Jones, MD  metoprolol succinate (TOPROL-XL) 25 MG 24 hr tablet Take 0.5 tablets (12.5 mg total) by mouth daily. 08/19/16   Eber Jones, MD  Multiple Vitamins-Minerals (HAIR/SKIN/NAILS) TABS Take 1 tablet by mouth 3 (three) times daily.    Historical Provider, MD  nitroGLYCERIN (NITROSTAT) 0.4 MG SL tablet  Place 0.4 mg under the tongue every 5 (five) minutes as needed for chest pain.    Historical Provider, MD  omeprazole (PRILOSEC) 20 MG capsule Take 20 mg by mouth every other day. For GERD    Historical Provider, MD  Pancrelipase, Lip-Prot-Amyl, 5000 units CPEP Take 5,000 Units by mouth 3 (three) times daily.    Historical Provider, MD  potassium chloride SA (K-DUR,KLOR-CON) 20 MEQ tablet Take 1 tablet (20 mEq total) by mouth daily. 08/25/16   Reyne Dumas, MD  senna-docusate (SENOKOT S) 8.6-50 MG per tablet Take 1 tablet by mouth at bedtime. For constipation 05/24/15   Ripudeep Krystal Eaton, MD  simvastatin (ZOCOR) 20 MG tablet Take 10 mg by mouth every other day. Take 1/2 tablet =10 mg for HLD.    Historical Provider, MD  Family History Family History  Problem Relation Age of Onset  . Heart failure Mother   . Heart disease Mother   . Bladder Cancer Father   . Pancreatic cancer Sister   . Lung cancer Sister   . Breast cancer Daughter   . Liver disease Son     Social History Social History  Substance Use Topics  . Smoking status: Former Research scientist (life sciences)  . Smokeless tobacco: Never Used  . Alcohol use No     Allergies   Albuterol sulfate hfa [kdc:albuterol]; Adhesive [tape]; Albuterol; Lactose; Penicillins; Sulfa drugs cross reactors; Budesonide-formoterol fumarate; Contrast media [iodinated diagnostic agents]; Ioxaglate; Metrizamide; and Sulfamethoxazole   Review of Systems Review of Systems  Unable to perform ROS: Acuity of condition     Physical Exam Updated Vital Signs BP 129/78 (BP Location: Left Arm)   Pulse 95   Temp 97.5 F (36.4 C) (Oral)   Resp (!) 35   SpO2 100%   Physical Exam  Constitutional: He is oriented to person, place, and time. He appears cachectic. He has a sickly appearance. He appears ill. He appears distressed.  HENT:  Head: Normocephalic and atraumatic.  Eyes: Conjunctivae and EOM are normal.  Cardiovascular: Normal rate and regular rhythm.     Pulmonary/Chest: Effort normal. No stridor. No respiratory distress.  Abdominal: He exhibits no distension.  Musculoskeletal: He exhibits no edema.  Neurological: He is alert and oriented to person, place, and time. He displays atrophy. He exhibits abnormal muscle tone.  Skin: Skin is warm and dry.  Multiple skin tears on both upper and lower extremities, with multiple areas of ecchymosis throughout the hematemesis as well.   Psychiatric: He is withdrawn. Cognition and memory are impaired.  Nursing note and vitals reviewed.    ED Treatments / Results  Labs (all labs ordered are listed, but only abnormal results are displayed) Labs Reviewed  COMPREHENSIVE METABOLIC PANEL  CBC WITH DIFFERENTIAL/PLATELET  TROPONIN I  BRAIN NATRIURETIC PEPTIDE    EKG  EKG Interpretation  Date/Time:  Saturday August 31 2016 17:11:12 EST Ventricular Rate:  87 PR Interval:    QRS Duration: 125 QT Interval:  443 QTC Calculation: 524 R Axis:   -105 Text Interpretation:  Sinus rhythm Short PR interval IVCD, consider atypical RBBB ST-t wave abnormality Artifact Abnormal ekg Confirmed by Carmin Muskrat  MD [id number]) on 08/31/2016 5:31:20 PM       Radiology Dg Chest 2 View  Result Date: 08/31/2016 CLINICAL DATA:  SOB and dyspnea. Pt is on non-rebreather mask on 10 L O2. EMS sts that pt had constricted lung fields and was moving little air upon assessment. Pt is end stage lung CA pt. Former smoker. H/o diabetes, COPD, HTN, Stroke. EXAM: CHEST  2 VIEW COMPARISON:  08/23/2016 FINDINGS: Cardiac silhouette is borderline enlarged. There is opacity at the left lung base which silhouettes the left hemidiaphragm. This is decreased when compared to the prior study. There may be a minimal associated left pleural effusion. No right pleural effusion. No new areas of lung consolidation.  No pneumothorax. No mediastinal or hilar masses. Skeletal structures are diffusely demineralized. There multiple compression  fractures of the visualized spine. IMPRESSION: Left lower lobe consolidation has improved when compared the prior study although has not resolved. This is consistent with improved pneumonia. No new abnormalities. Electronically Signed   By: Lajean Manes M.D.   On: 08/31/2016 17:56    Procedures Procedures (including critical care time)  Medications Ordered in ED Medications  acetaminophen (TYLENOL) tablet  650 mg (not administered)  cycloSPORINE (RESTASIS) 0.05 % ophthalmic emulsion 1 drop (1 drop Both Eyes Given 08/31/16 2350)  insulin glargine (LANTUS) injection 8 Units (8 Units Subcutaneous Given 08/31/16 2350)  latanoprost (XALATAN) 0.005 % ophthalmic solution 1 drop (1 drop Right Eye Given 08/31/16 2351)  metoprolol succinate (TOPROL-XL) 24 hr tablet 12.5 mg (not administered)  aspirin chewable tablet 81 mg (not administered)  simvastatin (ZOCOR) tablet 10 mg (not administered)  ondansetron (ZOFRAN) tablet 4 mg (not administered)    Or  ondansetron (ZOFRAN) injection 4 mg (not administered)  insulin aspart (novoLOG) injection 0-9 Units (not administered)  insulin aspart (novoLOG) injection 0-5 Units (4 Units Subcutaneous Given 09/01/16 0000)  lipase/protease/amylase (CREON) capsule 12,000 Units (not administered)  nitroGLYCERIN (NITROGLYN) 2 % ointment 0.5 inch (0.5 inches Topical Given 08/31/16 2352)  morphine 2 MG/ML injection 1 mg (not administered)  ipratropium (ATROVENT) nebulizer solution 0.5 mg (not administered)    Chart review notable for recent evaluation by his cardiologist with clear documentation for DO NOT RESUSCITATE status. Initial Impression / Assessment and Plan / ED Course  I have reviewed the triage vital signs and the nursing notes.  Pertinent labs & imaging results that were available during my care of the patient were reviewed by me and considered in my medical decision making (see chart for details).  Clinical Course     7:22 PM Patient's wife is now  present promotion as the patient became more short of breath today, and maximized his home oxygen, 5 L/m.  Here, the patient is on 8 L to maintain appropriate oxygen saturation. She notes the patient has no ongoing therapy for his metastatic prostate cancer, receives his care at the [facility].  Had a lengthy conversation about all findings, including the patient's notably ill appearance, elevated troponin, ongoing nontreatment for cancer. Wife states the patient has been discussed with hospice care, but is not currently a generally. However, she is amenable to pursuing this, palliative, comfort care, given his substantial illness.  Final Clinical Impressions(s) / ED Diagnoses   Final diagnoses:  Hypoxia     Carmin Muskrat, MD 09/01/16 [phone]

## 2016-09-01 ENCOUNTER — Encounter (HOSPITAL_COMMUNITY): Payer: Self-pay | Admitting: Internal Medicine

## 2016-09-01 DIAGNOSIS — I10 Essential (primary) hypertension: Secondary | ICD-10-CM | POA: Diagnosis not present

## 2016-09-01 DIAGNOSIS — C61 Malignant neoplasm of prostate: Secondary | ICD-10-CM | POA: Diagnosis present

## 2016-09-01 DIAGNOSIS — E1122 Type 2 diabetes mellitus with diabetic chronic kidney disease: Secondary | ICD-10-CM | POA: Diagnosis present

## 2016-09-01 DIAGNOSIS — J9621 Acute and chronic respiratory failure with hypoxia: Secondary | ICD-10-CM | POA: Diagnosis present

## 2016-09-01 DIAGNOSIS — C7800 Secondary malignant neoplasm of unspecified lung: Secondary | ICD-10-CM | POA: Diagnosis present

## 2016-09-01 DIAGNOSIS — J9601 Acute respiratory failure with hypoxia: Secondary | ICD-10-CM | POA: Diagnosis not present

## 2016-09-01 DIAGNOSIS — Z79899 Other long term (current) drug therapy: Secondary | ICD-10-CM | POA: Diagnosis not present

## 2016-09-01 DIAGNOSIS — Z794 Long term (current) use of insulin: Secondary | ICD-10-CM | POA: Diagnosis not present

## 2016-09-01 DIAGNOSIS — N179 Acute kidney failure, unspecified: Secondary | ICD-10-CM | POA: Diagnosis present

## 2016-09-01 DIAGNOSIS — I2510 Atherosclerotic heart disease of native coronary artery without angina pectoris: Secondary | ICD-10-CM | POA: Diagnosis present

## 2016-09-01 DIAGNOSIS — E1121 Type 2 diabetes mellitus with diabetic nephropathy: Secondary | ICD-10-CM | POA: Diagnosis present

## 2016-09-01 DIAGNOSIS — Z7982 Long term (current) use of aspirin: Secondary | ICD-10-CM | POA: Diagnosis not present

## 2016-09-01 DIAGNOSIS — R0902 Hypoxemia: Secondary | ICD-10-CM | POA: Diagnosis present

## 2016-09-01 DIAGNOSIS — Z955 Presence of coronary angioplasty implant and graft: Secondary | ICD-10-CM | POA: Diagnosis not present

## 2016-09-01 DIAGNOSIS — Z66 Do not resuscitate: Secondary | ICD-10-CM | POA: Diagnosis present

## 2016-09-01 DIAGNOSIS — E785 Hyperlipidemia, unspecified: Secondary | ICD-10-CM | POA: Diagnosis not present

## 2016-09-01 DIAGNOSIS — I248 Other forms of acute ischemic heart disease: Secondary | ICD-10-CM | POA: Diagnosis present

## 2016-09-01 DIAGNOSIS — E119 Type 2 diabetes mellitus without complications: Secondary | ICD-10-CM | POA: Diagnosis not present

## 2016-09-01 DIAGNOSIS — I130 Hypertensive heart and chronic kidney disease with heart failure and stage 1 through stage 4 chronic kidney disease, or unspecified chronic kidney disease: Secondary | ICD-10-CM | POA: Diagnosis present

## 2016-09-01 DIAGNOSIS — I5042 Chronic combined systolic (congestive) and diastolic (congestive) heart failure: Secondary | ICD-10-CM | POA: Diagnosis present

## 2016-09-01 DIAGNOSIS — L89153 Pressure ulcer of sacral region, stage 3: Secondary | ICD-10-CM | POA: Diagnosis present

## 2016-09-01 DIAGNOSIS — J440 Chronic obstructive pulmonary disease with acute lower respiratory infection: Secondary | ICD-10-CM | POA: Diagnosis present

## 2016-09-01 DIAGNOSIS — Z681 Body mass index (BMI) 19 or less, adult: Secondary | ICD-10-CM | POA: Diagnosis not present

## 2016-09-01 DIAGNOSIS — Z87891 Personal history of nicotine dependence: Secondary | ICD-10-CM | POA: Diagnosis not present

## 2016-09-01 DIAGNOSIS — J189 Pneumonia, unspecified organism: Secondary | ICD-10-CM | POA: Diagnosis present

## 2016-09-01 DIAGNOSIS — N183 Chronic kidney disease, stage 3 (moderate): Secondary | ICD-10-CM | POA: Diagnosis present

## 2016-09-01 DIAGNOSIS — Z515 Encounter for palliative care: Secondary | ICD-10-CM | POA: Diagnosis present

## 2016-09-01 DIAGNOSIS — E43 Unspecified severe protein-calorie malnutrition: Secondary | ICD-10-CM | POA: Diagnosis present

## 2016-09-01 LAB — GLUCOSE, CAPILLARY
GLUCOSE-CAPILLARY: 82 mg/dL (ref 65–99)
Glucose-Capillary: 198 mg/dL — ABNORMAL HIGH (ref 65–99)
Glucose-Capillary: 177 mg/dL — ABNORMAL HIGH (ref 65–99)
Glucose-Capillary: 132 mg/dL — ABNORMAL HIGH (ref 65–99)

## 2016-09-01 MED ORDER — NYSTATIN 100000 UNIT/GM EX POWD
Freq: Three times a day (TID) | CUTANEOUS | Status: DC
  Administered 2016-09-01 – 2016-09-02 (×3): via TOPICAL
  Filled 2016-09-01: qty 15

## 2016-09-01 MED ORDER — OXYCODONE HCL 5 MG PO TABS
10.0000 mg | ORAL_TABLET | Freq: Four times a day (QID) | ORAL | Status: DC | PRN
  Administered 2016-09-01 – 2016-09-02 (×2): 10 mg via ORAL
  Filled 2016-09-01 (×2): qty 2

## 2016-09-01 MED ORDER — CHLORHEXIDINE GLUCONATE 0.12 % MT SOLN
15.0000 mL | Freq: Two times a day (BID) | OROMUCOSAL | Status: DC
  Administered 2016-09-01 – 2016-09-02 (×3): 15 mL via OROMUCOSAL
  Filled 2016-09-01 (×3): qty 15

## 2016-09-01 MED ORDER — ORAL CARE MOUTH RINSE
15.0000 mL | Freq: Two times a day (BID) | OROMUCOSAL | Status: DC
  Administered 2016-09-01 – 2016-09-02 (×3): 15 mL via OROMUCOSAL

## 2016-09-01 NOTE — Care Management Obs Status (Signed)
[facility] NOTIFICATION   Patient Details  Name: Guy [name] MRN: [id number] Date of Birth: 1919/10/28   Medicare Observation Status Notification Given:  Yes    Erenest Rasher, RN 09/01/2016, 5:49 PM

## 2016-09-01 NOTE — Progress Notes (Addendum)
Patient ID: Guy [name], male   DOB: 1920-07-10, 82 y.o.   MRN: [id number]  PROGRESS NOTE    BERN FARE  KZS:[id number] DOB: 11/09/1919 DOA: 08/31/2016  PCP: [name], MD   Brief Narrative:  82 y.o. gentleman with a history of metastatic prostate cancer, CAD S/P stent, combined systolic and diastolic heart failure, HTN, DM, COPD, stage III sacral ulcer who was just admitted from 11/22-11/26 for HCAP and CHF exacerbation. Pt was on broad spectrum IV antibiotics, which were discontinued at time of discharge because the patient was discharged home with [facility] support.  Apparently, his wife had reservations about this decision post-discharge and wanted her husband evaluated by his cardiologist again.  The patient saw Dr. Gwenlyn Found in clinic on 11/29.  He appeared dry on exam, and his lasix dose was reduced.  There were no new recommendations for further invasive testing and changes to his medical therapy.    Patient was already on 5L [place] at home. He was brought to ED due to respiratory distress and hypoxia with O2 sats in 0's but pr pt wife she could not give more than 6L oxygen at home.  In ED, pt was hemodynamically stable. His oxygen level improved with NRM.  Assessment & Plan:   Acute on chronic hypoxic respiratory failure, oxygen dependent  - Likely multifactorial due to recent pneumonia and CHF - Pneumonia on admission CXR is improving - Resp status stable on Ventimask - Continue Atrovent every 4 hours as needed for shortness of breath or wheezing  Elevated troponin - Likely demand ischemia in the setting of profound hypoxia - No need for troponin level as the focus is comfort  - Continue baby aspirin, beta blocker, statin for now  Diabetes mellitus with diabetic nephropathy with long term insulin use - Continue Lantus 8 units at bedtime and SSI  CKD stage 3 - Recent creatinine 1.37 about 2 weeks ago  - Cr on this admission 1.49  Chronic combined systolic and  diastolic CHF - Compensated and actually appears clinically dry - Lasix on hold   Essential hypertension - Continue metoprolol   Dyslipidemia - Continue statin therapy   Chronic pressure ulcer, stage III, sacrum - Present on admission. - Per RN care   Severe protein calorie malnutrition - In the context of chronic illness - Diet as tolerated    DVT prophylaxis: SCD's bilaterally  Code Status: full code  Family Communication: no family at the bedside this am Disposition Plan: if there is no option to increase oxygen level beyond 6 L at home then pt may need to go to res hosp, SW and case management  consulted  Consultants:   SW and CM  PT  Procedures:   None   Antimicrobials:   None     Subjective: No overnight events.   Objective: Vitals:   08/31/16 2025 08/31/16 2119 09/01/16 0516 09/01/16 1359  BP: 155/75 (!) 127/50 (!) 116/38 (!) 152/77  Pulse: 89 86 72 72  Resp: 25 (!) 22 (!) 22 20  Temp:    98.8 F (37.1 C)  TempSrc:    Axillary  SpO2: 100% 100% 100% 100%    Intake/Output Summary (Last 24 hours) at 09/01/16 1507 Last data filed at 09/01/16 0945  Gross per 24 hour  Intake              240 ml  Output              450 ml  Net             -  210 ml   Filed Weights    Examination:  General exam: Appears calm and comfortable, on oxygen support, [place] and VM Respiratory system: Diminished breath sounds, no wheezing  Cardiovascular system: S1 & S2 heard, Rate controlled  Gastrointestinal system: Abdomen is nondistended, soft and nontender. No organomegaly or masses felt. Normal bowel sounds heard. Central nervous system: No focal neurological deficits. Extremities: Symmetric 5 x 5 power. Skin: No rashes, lesions or ulcers Psychiatry: Mood & affect appropriate.   Data Reviewed: I have personally reviewed following labs and imaging studies  CBC:  Recent Labs Lab 08/31/16 1800  WBC 10.6*  NEUTROABS 9.1*  HGB 10.5*  HCT 31.1*  MCV 103.0*    PLT 361   Basic Metabolic Panel:  Recent Labs Lab 08/31/16 1845  NA 141  K 4.1  CL 106  CO2 24  GLUCOSE 334*  BUN 64*  CREATININE 1.49*  CALCIUM 9.2   GFR: Estimated Creatinine Clearance: 19.5 mL/min (by C-G formula based on SCr of 1.49 mg/dL (H)). Liver Function Tests:  Recent Labs Lab 08/31/16 1845  AST 42*  ALT 27  ALKPHOS 187*  BILITOT 1.1  PROT 7.8  ALBUMIN 2.6*   No results for input(s): LIPASE, AMYLASE in the last 168 hours. No results for input(s): AMMONIA in the last 168 hours. Coagulation Profile: No results for input(s): INR, PROTIME in the last 168 hours. Cardiac Enzymes:  Recent Labs Lab 08/31/16 1845  TROPONINI 0.29*   BNP (last 3 results) No results for input(s): PROBNP in the last 8760 hours. HbA1C: No results for input(s): HGBA1C in the last 72 hours. CBG:  Recent Labs Lab 08/25/16 2015 08/31/16 2203 08/31/16 2347 09/01/16 0744 09/01/16 1214  GLUCAP 193* 332* 334* 198* 177*   Lipid Profile: No results for input(s): CHOL, HDL, LDLCALC, TRIG, CHOLHDL, LDLDIRECT in the last 72 hours. Thyroid Function Tests: No results for input(s): TSH, T4TOTAL, FREET4, T3FREE, THYROIDAB in the last 72 hours. Anemia Panel: No results for input(s): VITAMINB12, FOLATE, FERRITIN, TIBC, IRON, RETICCTPCT in the last 72 hours. Urine analysis:    Component Value Date/Time   COLORURINE YELLOW 08/21/2016 2340   APPEARANCEUR CLOUDY (A) 08/21/2016 2340   LABSPEC 1.019 08/21/2016 2340   PHURINE 5.5 08/21/2016 2340   GLUCOSEU NEGATIVE 08/21/2016 2340   HGBUR MODERATE (A) 08/21/2016 2340   BILIRUBINUR NEGATIVE 08/21/2016 2340   KETONESUR NEGATIVE 08/21/2016 2340   PROTEINUR 30 (A) 08/21/2016 2340   UROBILINOGEN 0.2 07/06/2015 1606   NITRITE NEGATIVE 08/21/2016 2340   LEUKOCYTESUR MODERATE (A) 08/21/2016 2340   Sepsis Labs: '@LABRCNTIP'$ (procalcitonin:4,lacticidven:4)    MRSA PCR Screening     Status: Abnormal   Collection Time: 08/23/16 11:23 AM   Result Value Ref Range Status   MRSA by PCR POSITIVE (A) NEGATIVE Final      Radiology Studies: Dg Chest 2 View Result Date: 08/31/2016 Left lower lobe consolidation has improved when compared the prior study although has not resolved. This is consistent with improved pneumonia. No new abnormalities.   Scheduled Meds: . aspirin  81 mg Oral Daily  . chlorhexidine  15 mL Mouth Rinse BID  . cycloSPORINE  1 drop Both Eyes BID  . insulin aspart  0-5 Units Subcutaneous QHS  . insulin aspart  0-9 Units Subcutaneous TID WC  . insulin glargine  8 Units Subcutaneous QHS  . latanoprost  1 drop Right Eye QHS  . lipase/protease/amylase  12,000 Units Oral TID WC  . mouth rinse  15 mL Mouth Rinse q12n4p  .  metoprolol succinate  12.5 mg Oral Daily  . nitroGLYCERIN  0.5 inch Topical Q6H  . simvastatin  10 mg Oral QODAY   Continuous Infusions:   LOS: 0 days    Time spent: 15 minutes  Greater than 50% of the time spent on counseling and coordinating the care.   Leisa Lenz, MD [facility] Pager [phone]  If 7PM-7AM, please contact night-coverage [email or web address] Password [email or web address] 09/01/2016, 3:07 PM

## 2016-09-01 NOTE — Plan of Care (Signed)
Problem: Skin Integrity: Goal: Risk for impaired skin integrity will decrease Maintain dressing to skin tears on bilateral arms, rt shoulder, lt scapula, vertebral column, and sacrum. Apply xeroform dressings to skin tears on arms

## 2016-09-01 NOTE — Progress Notes (Signed)
Chaplain visit the result a spiritual care consult in Mr Guy [name] record. Mr Guy [name] was indisposed when the chaplain arrived. After staff got him settled in chaplain visited. Mr Guy [name] is extremely hard of hearing and fragile to the touch. Perhaps some of what Mr Guy [name] said through this breathing mask may have been lost. He requested and was given prayer.  Follow up spiritual care required.  Guy [name]. Guy [name], [place]

## 2016-09-01 NOTE — Progress Notes (Signed)
Patient arrived on the unit at approximately 2100 from the ED. He is alert, HOH, and wears a rt hearing aid. Patient is very emaciated with multiple skin tears with some in various stages of healing. Noted multiple ecchymotic areas to bilateral upper extremities. Has thin fragile skin. See assessment for wounds. Patient was placed on an air mattress for skin breakdown prevention. Spoke with spouse later on shift and updated her on patient's status.

## 2016-09-01 NOTE — Care Management Note (Signed)
Case Management Note  Patient Details  Name: Guy [name] MRN: [id number] Date of Birth: 1920-03-19  Subjective/Objective:     CHF end-stage               Action/Plan: Discharge Planning: NCM spoke to pt's wife, Guy [name] via phone # [phone], cell # [phone]. Explained observation notice. Verbalized understanding. Copy left in room. Offered choice/Hospice provider list left in room. for Home Hospice. Wife states pt had [facility] and requested them again. [facility] Liaison and left message. Wife states she should be in room in am around 10 am. Will make Liaison aware. Will have weekday NCM follow up on referral on 09/02/2016. Will need high flow oxygen tank for home.    PCP  Lorne Skeens MD  Expected Discharge Date:                 Expected Discharge Plan:  Home w Hospice Care  In-House Referral:  NA  Discharge planning Services  CM Consult  Post Acute Care Choice:  Hospice Choice offered to:  Spouse  DME Arranged:  Oxygen DME Agency:     HH Arranged:  RN [place] Agency:  Other - See comment  Status of Service:  In process, will continue to follow  If discussed at Long Length of Stay Meetings, dates discussed:    Additional Comments:  Erenest Rasher, RN 09/01/2016, 5:41 PM

## 2016-09-02 DIAGNOSIS — E119 Type 2 diabetes mellitus without complications: Secondary | ICD-10-CM

## 2016-09-02 DIAGNOSIS — Z515 Encounter for palliative care: Secondary | ICD-10-CM

## 2016-09-02 DIAGNOSIS — I10 Essential (primary) hypertension: Secondary | ICD-10-CM

## 2016-09-02 DIAGNOSIS — Z794 Long term (current) use of insulin: Secondary | ICD-10-CM

## 2016-09-02 DIAGNOSIS — J9601 Acute respiratory failure with hypoxia: Secondary | ICD-10-CM

## 2016-09-02 DIAGNOSIS — I2510 Atherosclerotic heart disease of native coronary artery without angina pectoris: Secondary | ICD-10-CM

## 2016-09-02 LAB — GLUCOSE, CAPILLARY
GLUCOSE-CAPILLARY: 158 mg/dL — AB (ref 65–99)
GLUCOSE-CAPILLARY: 60 mg/dL — AB (ref 65–99)
Glucose-Capillary: 56 mg/dL — ABNORMAL LOW (ref 65–99)
Glucose-Capillary: 133 mg/dL — ABNORMAL HIGH (ref 65–99)

## 2016-09-02 MED ORDER — FUROSEMIDE 20 MG PO TABS: 20.0000 mg | ORAL_TABLET | Freq: Every day | ORAL | 1 refills | Status: AC | PRN

## 2016-09-02 MED ORDER — NYSTATIN 100000 UNIT/GM EX POWD: Freq: Three times a day (TID) | CUTANEOUS | 0 refills | Status: AC

## 2016-09-02 NOTE — Progress Notes (Signed)
CSW consulted for residential hospice home placement. PN reviewed. Pt discussed at rounds. Pt will d/c home with hospice. RNCM is assisting with d/c planning.  Werner Lean LCSW [phone]

## 2016-09-02 NOTE — Progress Notes (Signed)
This CM spoke with [employer] rep Bambi this am. Bambi to meet with wife at bedside at [place] to speak with wife about setting up home 02. This CM to fill out medical necessity form and print along with demographic sheet for [facility] transport if needed. CM will continue to follow. Marney Doctor RN,BSN,NCM [phone]

## 2016-09-02 NOTE — Discharge Summary (Signed)
Discharge Summary  Guy [name]:[id number] DOB: May 31, 1920  PCP: [name], MD  Admit date: 08/31/2016 Discharge date: 09/02/2016  Time spent: >33mns, more than 50% time spent on coordination of care  Recommendations for Outpatient Follow-up:  1. Patient is discharged to home with home hospice  Discharge Diagnoses:  Active Hospital Problems   Diagnosis Date Noted  . Acute respiratory failure with hypoxia ([facility] 09/01/2016  . Dyslipidemia   . Pressure ulcer, stage III ([facility] 08/15/2016  . Protein-calorie malnutrition, severe ([facility] 05/23/2015  . Coronary artery disease involving native coronary artery of native heart without angina pectoris 04/13/2014  . Elevated troponin 04/03/2014  . Essential hypertension, benign 12/28/2012  . Leukocytosis 11/16/2012    Resolved Hospital Problems   Diagnosis Date Noted Date Resolved  No resolved problems to display.    Discharge Condition: stable,   Diet recommendation: soft diet/heart healthy/carb modified  Filed Weights    History of present illness:  Patient coming from: Home  Chief Complaint: Shortness of breath, hypoxia  HPI: Guy SOLOWAYis a 82y.o. gentleman with a history of Metastatic prostate cancer, CAD S/P stent, Combined systolic and diastolic heart failure, HTN, DM, COPD, and stage III sacral ulcer who was just admitted from 11/22-11/26 for HCAP and CHF exacerbation.  He was on broad spectrum IV antibiotics, which were discontinued at time of discharge because the patient [name]  Apparently, his wife had reservations about this decision post-discharge and wanted her husband evaluated by his cardiologist again.  The patient saw Dr. [facility]wen[facility] on 11/29.  He appeared dry on exam, and his lasix dose was reduced.  Otherwise, there were no new recommendations for further invasive testing and changes to his medical therapy.    Reportedly, the patient was already on 5L [place]  at home.  However, his wife called [phone] for respiratory distress, and he was found to have O2 sats in the 70's in the field.  He received atrovent nebulizer treatments (allergy to albuterol), IV solumedrol 125 mg, and '2mg'$  of IV magnesium.  He was placed on a NRB which has been weaned to a Venti mask.  ED Course: Chest xray shows improvement in previously identified LLL infiltrate.  EKG shows NSR.  Troponin 0.29, which is slightly up from baseline.  The patient has evidence of progressive AKI.  He now appears comfortable on increased oxygen, but he is complaining of chest pain and shortness of breath.  He denies light-headedness and dizziness.  He is awake and knows that he is at [name]  Per the ED attending, plan of care is keep the patient comfortable and facilitate appropriate discharge plan with hospice support.  I was unable to reach the patient's wife at time of admission.  Hospital Course:  Active Problems:   Leukocytosis   Essential hypertension, benign   Elevated troponin   Coronary artery disease involving native coronary artery of native heart without angina pectoris   Protein-calorie malnutrition, severe (HCC)   Pressure ulcer, stage III (HCC)   Dyslipidemia   Acute respiratory failure with hypoxia (HCC)   Acute on chronic hypoxic respiratory failure, oxygen dependent  - patient was treated with pna from last hospitalization a week ago, he was not discharged on abx due to discharged home on hospice, repeat cxr this admission, though pna not completely resolved, but has improved,  - patient looks dehydrated, not consistent with chf exacerbation -etiology of acute hypoxia not clear, could be due to  mucus plug, patient has improved , now on room air, not in respiratory distress - reason for this hospitalization was not able to provide enough oxygen for patient 's comfort at home, now patient 's respiratory distress has resolved, patient is discharged home with home hospice and home  oxygen  Elevated troponin - Likely demand ischemia in the setting of profound hypoxia - No need for troponin level as the focus is comfort  - Continue baby aspirin, beta blocker, statin for now  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 - Per RN care   Severe protein calorie malnutrition - In the context of chronic illness - Diet as tolerated , per RN patient need to be fed but tolerated diet and pills     Code Status: DNR Family Communication: no family at the bedside this am Disposition Plan: home with home hospice  Consultants:   SW and CM  PT  Procedures:   None   Antimicrobials:   None      Discharge Exam: BP (!) 146/67 (BP Location: Left Leg)   Pulse 62   Temp 98 F (36.7 C) (Axillary)   Resp (!) 22   SpO2 100%   General: frail, cachectic , not following command, mumbles, but does not look in acute distress Cardiovascular: RRR Respiratory: diminished breath sounds, no wheezing, no rales, no rhonchi Extremity: no edema  Discharge Instructions You were cared for by a hospitalist during your hospital stay. If you have any questions about your discharge medications or the care you received while you were in the hospital after you are discharged, you can call the unit and asked to speak with the hospitalist on call if the hospitalist that took care of you is not available. Once you are discharged, your primary care physician will handle any further medical issues.  Please note that NO REFILLS for any discharge medications will be authorized once you are discharged, as it is imperative that you return to your primary care physician (or establish a relationship with a primary care physician if you do not have one) for your aftercare needs so that they can reassess your need for medications and monitor your lab values.  Discharge Instructions    Discharge instructions    Complete by:  As directed    Soft diet, carb modified   Increase activity slowly    Complete by:  As directed        Medication List    STOP taking these medications   HAIR/SKIN/NAILS Tabs   omeprazole 20 MG capsule Commonly known as:  PRILOSEC     TAKE these medications   acetaminophen 325 MG tablet Commonly known as:  TYLENOL Take 2 tablets (650 mg total) by mouth every 4 (four) hours as needed for headache or mild pain.   aspirin 81 MG tablet Take 81 mg by mouth daily. For heart theraphy   CALCIUM 600+D HIGH POTENCY 600-400 MG-UNIT tablet Generic drug:  Calcium Carbonate-Vitamin D Take 1 tablet by mouth 2 (two) times  daily.   CRANBERRY CONCENTRATE PO Take 1 capsule by mouth 3 (three) times daily.   cycloSPORINE 0.05 % ophthalmic emulsion Commonly known as:  RESTASIS Place 1 drop into both eyes 2 (two) times daily.   feeding supplement (PRO-STAT SUGAR FREE 64) Liqd Take 30 mLs by mouth daily.   furosemide 20 MG tablet Commonly known as:  LASIX Take 1 tablet (20 mg total) by mouth daily as needed for fluid or edema. What changed:  when to take this  reasons to take this   insulin glargine 100 UNIT/ML injection Commonly known as:  LANTUS Inject 0.08 mLs (8 Units total) into the skin at bedtime.   latanoprost 0.005 % ophthalmic solution Commonly known as:  XALATAN Place 1 drop into the right eye at bedtime.   metoprolol succinate 25 MG 24 hr tablet Commonly known as:  TOPROL-XL Take 0.5 tablets (12.5 mg total) by mouth daily.   nitroGLYCERIN 0.4 MG SL  tablet Commonly known as:  NITROSTAT Place 0.4 mg under the tongue every 5 (five) minutes as needed for chest pain.   nystatin powder Commonly known as:  MYCOSTATIN/NYSTOP Apply topically 3 (three) times daily.   Pancrelipase (Lip-Prot-Amyl) 5000 units Cpep Take 5,000 Units by mouth 3 (three) times daily.   potassium chloride SA 20 MEQ tablet Commonly known as:  K-DUR,KLOR-CON Take 1 tablet (20 mEq total) by mouth daily.   senna-docusate 8.6-50 MG tablet Commonly known as:  SENOKOT S Take 1 tablet by mouth at bedtime. For constipation   simvastatin 20 MG tablet Commonly known as:  ZOCOR Take 10 mg by mouth every other day. Take 1/2 tablet =10 mg for HLD.      Allergies  Allergen Reactions  . Albuterol Sulfate Hfa [Kdc:Albuterol] Shortness Of Breath and Swelling  . Adhesive [Tape] Other (See Comments)    REACTION: SKIN BLISTERS  . Albuterol Swelling    Per pt., side of his neck began to swell with nebulized Albuterol in doctor's office.  . Lactose Other (See Comments)    Other reaction(s): GI Upset (intolerance)  . Penicillins Itching and Other (See Comments)    REACTION: ITCHING HANDS Has patient had a PCN reaction causing immediate rash, facial/tongue/throat swelling, SOB or lightheadedness with hypotension: No Has patient had a PCN reaction causing severe rash involving mucus membranes or skin necrosis: No Has patient had a PCN reaction that required hospitalization Yes Has patient had a PCN reaction occurring within the last 10 years: No If all of the above answers are "NO", then may proceed with Cephalosporin use.   . Sulfa Drugs Cross Reactors Hives  . Budesonide-Formoterol Fumarate Rash  . Contrast Media [Iodinated Diagnostic Agents] Other (See Comments)    Reaction unknown  . Ioxaglate Other (See Comments)    Reaction unknown  . Metrizamide Other (See Comments)    Reaction unknown  . Sulfamethoxazole Rash   Follow-up Information    [facility]  Follow up.   Specialty:  Hospice Services Why:  Hospice RN will contact you arrange appointment Contact information: [place] [address] [phone]            The results of significant diagnostics from this hospitalization (including imaging, microbiology, ancillary and laboratory) are listed below for reference.    Significant Diagnostic Studies: Dg Chest 2 View  Result Date: 08/31/2016 CLINICAL DATA:  SOB and dyspnea. Pt is on non-rebreather mask on 10 L O2. EMS sts that pt had constricted lung fields and was  moving little air upon assessment. Pt is end stage lung CA pt. Former smoker. H/o diabetes, COPD, HTN, Stroke. EXAM: CHEST  2 VIEW COMPARISON:  08/23/2016 FINDINGS: Cardiac silhouette is borderline enlarged. There is opacity at the left lung base which silhouettes the left hemidiaphragm. This is decreased when compared to the prior study. There may be a minimal associated left pleural effusion. No right pleural effusion. No new areas of lung consolidation.  No pneumothorax. No mediastinal or hilar masses. Skeletal structures are diffusely demineralized. There multiple compression fractures of the visualized spine. IMPRESSION: Left lower lobe consolidation has improved when compared the prior study although has not resolved. This is consistent with improved pneumonia. No new abnormalities. Electronically Signed   By: Lajean Manes M.D.   On: 08/31/2016 17:56   Dg Chest 2 View  Result Date: 08/23/2016 CLINICAL DATA:  Shortness of Breath EXAM: CHEST  2 VIEW COMPARISON:  Chest radiograph April 20, 2016; chest CT April 21, 2016 FINDINGS: There is airspace consolidation with pleural effusion in the left lower lobe. The right lung is clear except for small granulomas in the right apex. Heart is mildly enlarged with pulmonary vascularity within normal limits. There is atherosclerotic calcification in the aorta. No adenopathy. Bones are osteoporotic. Multiple wedge  compression fractures in the thoracic and upper lumbar regions are stable. There is increase kyphosis in the upper thoracic region, stable. IMPRESSION: Left lower lobe airspace consolidation consistent with pneumonia. Small left effusion. Lungs elsewhere clear. Heart mildly prominent with pulmonary vascularity within normal limits. There is aortic atherosclerosis. There is osteoporosis with multiple stable appearing vertebral body compression fractures. Electronically Signed   By: Lowella Grip III M.D.   On: 08/23/2016 11:09   Ct Chest Wo Contrast  Result Date: 08/22/2016 CLINICAL DATA:  Shortness of breath for the past few days EXAM: CT CHEST WITHOUT CONTRAST TECHNIQUE: Multidetector CT imaging of the chest was performed following the standard protocol without IV contrast. COMPARISON:  Chest x-ray from yesterday FINDINGS: Cardiovascular: Borderline cardiomegaly. No pericardial effusion. Extensive atherosclerotic calcification. Aortic valvular calcification. Tortuosity of the great vessels. No acute vascular finding. Mediastinum/Nodes: Negative for adenopathy or mass. Lungs/Pleura: Airspace disease and volume loss in the left more than right lower lobes lingula. Small left pleural effusion posteriorly at the bases, with pleural thickening where contiguous with the lower lobe opacity. There is some patchy high density material within the left basilar opacity which could reflect aspirated material. These opacities have developed since 2016. Upper Abdomen: No acute finding. Cholecystectomy with common bile duct dilatation. Suspect main pancreatic duct dilatation. Musculoskeletal: Severe osteopenia. Cachexia. Remote posttraumatic deformity of the proximal right humerus and multiple bilateral ribs. There are chronic appearing compression fractures of T2, T3, T4, T5, T9, and L2. Height loss causes exaggerated thoracic kyphosis in the upper thoracic spine. The T9 fracture appears most recent, but is not acute.  IMPRESSION: 1. Bibasilar opacity that is greatest in the left lower lobe. Most of the opacity is attributed atelectasis, but pneumonia could be superimposed if there are infectious symptoms. Small left pleural effusion with pleural thickening in the region of the left lower lobe opacity, if persistent round atelectasis or mass is likely. Recommend follow-up radiography 2 to 3 weeks after any treatment. 2. Question main pancreatic duct dilatation. Suggest abdominal sonography initially in this patient with elevated creatinine. 3. Marked osteopenia with numerous remote compression and rib fractures. Electronically Signed   By: Monte Fantasia M.D.   On: 08/22/2016 15:21   Nm Pulmonary  Perf And Vent  Result Date: 08/23/2016 CLINICAL DATA:  Congestive heart failure, COPD on 3 L of oxygen at night. Patient complains of shortness of breath. EXAM: NUCLEAR MEDICINE VENTILATION - PERFUSION LUNG SCAN TECHNIQUE: Ventilation images were obtained in multiple projections using inhaled aerosol Tc-79mDTPA. Perfusion images were obtained in multiple projections after intravenous injection of Tc-997mAA. RADIOPHARMACEUTICALS:  32 mCi Technetium-9932mPA aerosol inhalation and 4.2 mCi Technetium-15m53m IV COMPARISON:  CT of the chest 08/22/2016 FINDINGS: Ventilation: No focal ventilation defect. Osteopenic defect in the left lower lobe likely corresponds to the airspace consolidation versus mass seen by recent CT. Perfusion: No wedge shaped peripheral perfusion defects to suggest acute pulmonary embolism. IMPRESSION: Low probability for pulmonary embolus. Electronically Signed   By: DobrFidela Salisbury.   On: 08/23/2016 10:54   Dg Chest Portable 1 View  Result Date: 08/21/2016 CLINICAL DATA:  89 y36  M; shortness of breath. EXAM: PORTABLE CHEST 1 VIEW COMPARISON:  08/15/2016 chest radiograph FINDINGS: Small left pleural effusion. Left mid and lower lung zone opacities may represent associated atelectasis or pneumonia.  Clear right lung. Stable cardiac silhouette. Aortic atherosclerosis with arch calcification. Lower thoracic kyphoplasty changes. Cholecystectomy clips in right upper quadrant. Fracture deformity of right proximal humerus. IMPRESSION: Small left pleural effusion and left mid and lower lung zone opacities which may represent associated atelectasis or pneumonia. Electronically Signed   By: LancKristine Garbe.   On: 08/21/2016 22:37   Dg Chest Port 1 View  Result Date: 08/15/2016 CLINICAL DATA:  Acute onset of shortness of breath. Initial encounter. EXAM: PORTABLE CHEST 1 VIEW COMPARISON:  Chest radiograph performed 05/20/2015 FINDINGS: The lungs are relatively well-aerated. Bilateral central airspace opacification raises concern for multifocal pneumonia, though interstitial edema might have a similar appearance. A small left pleural effusion is suspected. No pneumothorax is seen, though the lung apices are partially obscured by the patient's head. The cardiomediastinal silhouette is borderline normal in size. No acute osseous abnormalities are identified. IMPRESSION: Bilateral central airspace opacification raises concern for multifocal pneumonia, though interstitial edema might have a similar appearance. Small left pleural effusion suspected. Electronically Signed   By: JeffGarald Balding.   On: 08/15/2016 03:07    Microbiology: No results found for this or any previous visit (from the past 240 hour(s)).   Labs: Basic Metabolic Panel:  Recent Labs Lab 08/31/16 1845  NA 141  K 4.1  CL 106  CO2 24  GLUCOSE 334*  BUN 64*  CREATININE 1.49*  CALCIUM 9.2   Liver Function Tests:  Recent Labs Lab 08/31/16 1845  AST 42*  ALT 27  ALKPHOS 187*  BILITOT 1.1  PROT 7.8  ALBUMIN 2.6*   No results for input(s): LIPASE, AMYLASE in the last 168 hours. No results for input(s): AMMONIA in the last 168 hours. CBC:  Recent Labs Lab 08/31/16 1800  WBC 10.6*  NEUTROABS 9.1*  HGB 10.5*    HCT 31.1*  MCV 103.0*  PLT 294   Cardiac Enzymes:  Recent Labs Lab 08/31/16 1845  TROPONINI 0.29*   BNP: BNP (last 3 results)  Recent Labs  08/15/16 0008 08/21/16 2255 08/31/16 1800  BNP 532.2* 550.1* 398.2*    ProBNP (last 3 results) No results for input(s): PROBNP in the last 8760 hours.  CBG:  Recent Labs Lab 09/01/16 1724 09/01/16 2207 09/02/16 0739 09/02/16 1253 09/02/16 1327  GLUCAP 132* 82 158* 56* 60*       Signed:  Janaysha Depaulo MD, PhD  [facility] 09/02/2016, 1:40 PM

## 2016-09-02 NOTE — Progress Notes (Signed)
Hypoglycemic Event  CBG: 56  Treatment: 15 GM carbohydrate snack  Symptoms: None  Follow-up CBG: Time: 1328 CBG Result:60  Possible Reasons for Event: Medication regimen: insulin  Comments/MD notified:    Nancy Marus [facility]

## 2016-09-02 NOTE — Progress Notes (Signed)
Hypoglycemic Event  CBG: 60  Treatment: 15 GM carbohydrate snack  Symptoms: None  Follow-up CBG: Time: 1528 CBG Result: 133  Possible Reasons for Event: Medication regimen: insulin  Comments/MD notified:    Nancy Marus [facility]

## 2016-09-02 NOTE — Progress Notes (Signed)
Patient discharged to home, all discharge medications and instructions reviewed with spouse over the telephone.  Patient to be transported via [place].

## 2016-09-02 NOTE — Evaluation (Signed)
Physical Therapy Evaluation Patient Details Name: Guy [name] MRN: [id number] DOB: 01/17/1920 Today's Date: 09/02/2016   History of Present Illness  Pt readmitted from home (where he had Hospice) with Acute on chronic respiratory distress. Recent hospitalization 11/22-11/26/17 with HCAP, CHF.  pt with hx of CHF, HTN, DM, COPD, Depression, CVA, Prostate CA with Mets, Bladder CA, Indwelling Catheter, CAD, Hep A, L Heel Ulcer, and Vertigo.    Clinical Impression  Pt admitted with above diagnosis. Pt currently with functional limitations due to the deficits listed below (see PT Problem List). +2 assist for bed to recliner transfer.  Per RN, plan is to return home with wife and Hospice care.  Pt will benefit from skilled PT to increase their independence and safety with mobility to allow discharge to the venue listed below.       Follow Up Recommendations Home health PT;Supervision/Assistance - 24 hour    Equipment Recommendations  None recommended by PT    Recommendations for Other Services       Precautions / Restrictions Precautions Precautions: Fall Precaution Comments: pt is very HOH. Restrictions Weight Bearing Restrictions: No      Mobility  Bed Mobility Overal bed mobility: Needs Assistance Bed Mobility: Supine to Sit     Supine to sit: Max assist     General bed mobility comments: Pt required assist to initiate moving LEs off bed and to lift shoulders.    Transfers Overall transfer level: Needs assistance Equipment used: Rolling walker (2 wheeled) Transfers: Sit to/from [facility] Sit to Stand: Mod assist;+2 safety/equipment Stand pivot transfers: Mod assist       General transfer comment: assist to rise and to weight shift for stepping to recliner, increased time to initiate movement, flexed standing posture, pt had BM during transfer, assisted pt with pericare  Ambulation/Gait                Stairs            Wheelchair  Mobility    Modified Rankin (Stroke Patients Only)       Balance   Sitting-balance support: Feet supported Sitting balance-Leahy Scale: Fair     Standing balance support: Bilateral upper extremity supported Standing balance-Leahy Scale: Poor                               Pertinent Vitals/Pain Pain Assessment: Faces Faces Pain Scale: No hurt    Home Living Family/patient expects to be discharged to:: Private residence Living Arrangements: Spouse/significant other Available Help at Discharge: Family;Personal care attendant;Available 24 hours/day Type of Home: House Home Access: Ramped entrance     Home Layout: One level Home Equipment: Walker - 2 wheels;Bedside commode;Shower seat;Hand held shower head Additional Comments: aides do all bathing and dressing, toileting and help walk and exercise pt    Prior Function Level of Independence: Needs assistance   Gait / Transfers Assistance Needed: walks with hands held with aide, not as a means of mobility  ADL's / Homemaking Assistance Needed: aides put him in the shower, self feeds with supervision  Comments: info obtained from prior encounter 08/23/16     Hand Dominance   Dominant Hand: Right    Extremity/Trunk Assessment   Upper Extremity Assessment: Defer to OT evaluation             RLE Deficits / Details: Strength <3/5 LLE Deficits / Details: Strength <3/5  Cervical / Trunk Assessment: Kyphotic  Communication   Communication: HOH  Cognition Arousal/Alertness: Awake/alert Behavior During Therapy: WFL for tasks assessed/performed Overall Cognitive Status: Difficult to assess                      General Comments      Exercises     Assessment/Plan    PT Assessment Patient needs continued PT services  PT Problem List Decreased strength;Decreased activity tolerance;Decreased balance;Decreased mobility          PT Treatment Interventions DME instruction;Gait  training;Functional mobility training;Therapeutic activities;Therapeutic exercise;Balance training;Patient/family education    PT Goals (Current goals can be found in the Care Plan section)  Acute Rehab PT Goals PT Goal Formulation: Patient unable to participate in goal setting Time For Goal Achievement: 09/16/16 Potential to Achieve Goals: Fair    Frequency Min 2X/week   Barriers to discharge        Co-evaluation               End of Session Equipment Utilized During Treatment: Gait belt;Oxygen Activity Tolerance: Patient limited by fatigue Patient left: in chair;with call bell/phone within reach;with chair alarm set Nurse Communication: Mobility status (recent BM, undressed skin tear R upper medial scapula)         Time: 8638-1771 PT Time Calculation (min) (ACUTE ONLY): 18 min   Charges:   PT Evaluation $PT Eval Moderate Complexity: 1 Procedure     PT G CodesPhilomena Doheny 09/02/2016, 12:29 PM [phone]

## 2016-09-30 DEATH — deceased

## 2017-02-01 IMAGING — CT CT ABD-PELV W/O CM
2 of 4 series · 16 of 46 positions shown, 18 images · non-contrast
Comparison: 09/05/2005

CLINICAL DATA: Headache and diarrhea.

EXAM:
CT ABDOMEN AND PELVIS WITHOUT CONTRAST
TECHNIQUE: Multidetector CT imaging of the abdomen and pelvis was performed
following the standard protocol without IV contrast.

[Series 3: abd/pel w/o · axial · non-contrast · 0.74mm/px · z∈[-797,-372]mm · 13 of 93 slices shown, 15 images]
[im 4/93  soft-tissue]
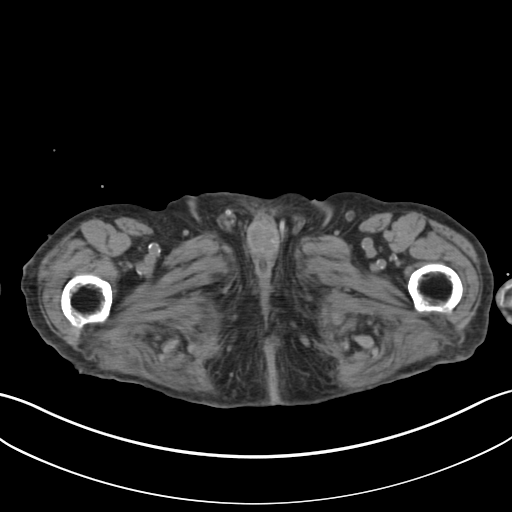
[im 4/93  bone]
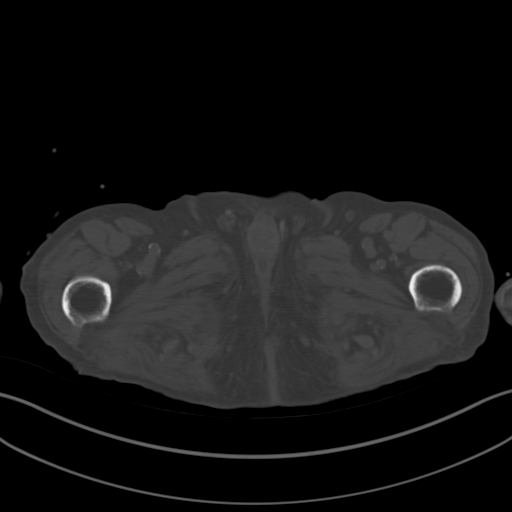
[im 12/93  soft-tissue]
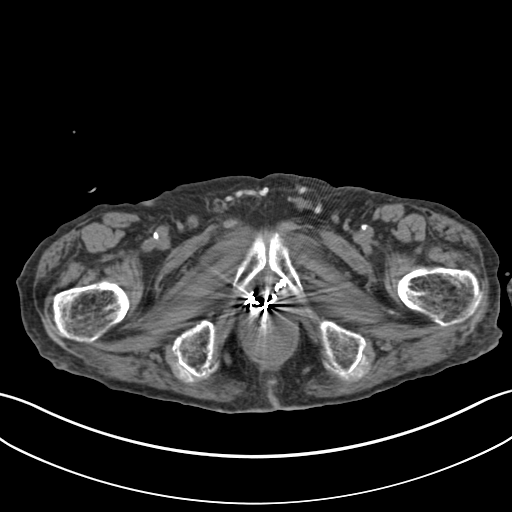
[im 20/93  soft-tissue]
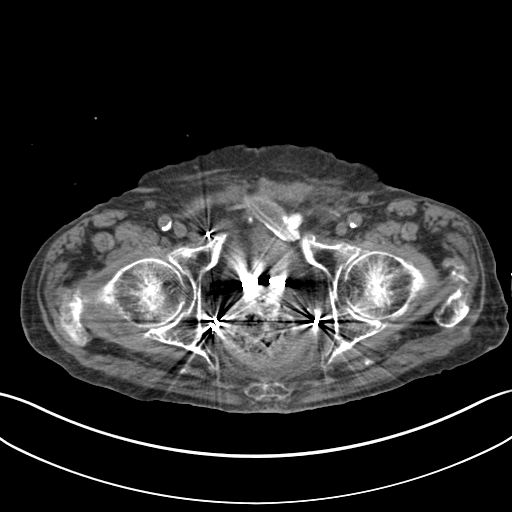
[im 27/93  soft-tissue]
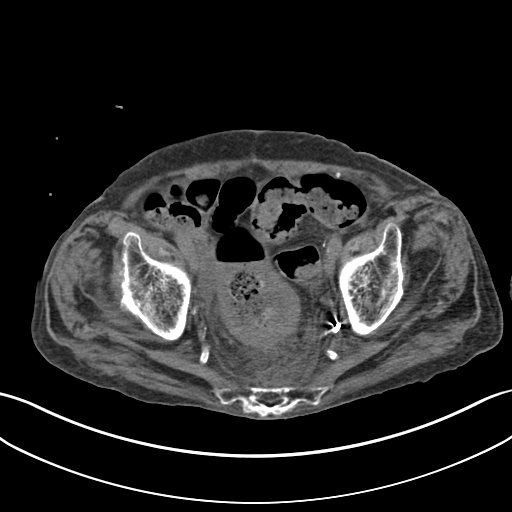
[im 31/93  soft-tissue]
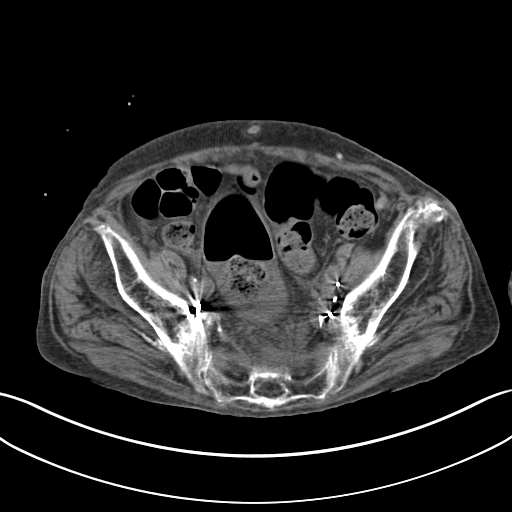
[im 39/93  soft-tissue]
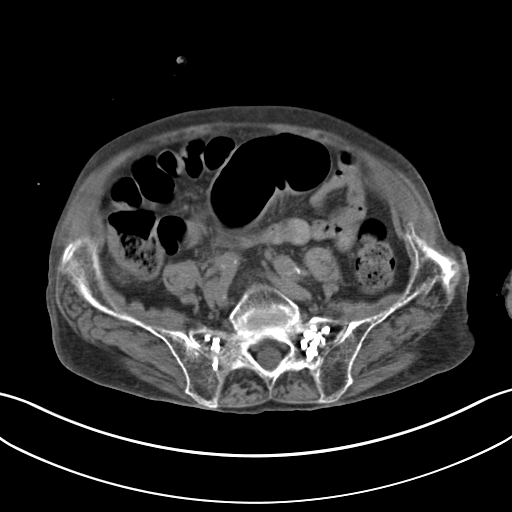
[im 47/93  soft-tissue]
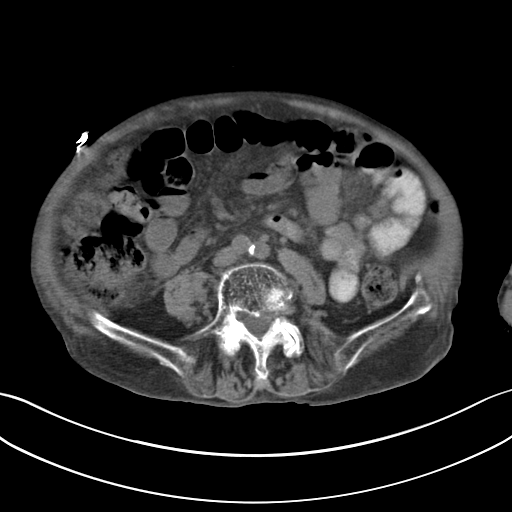
[im 54/93  soft-tissue]
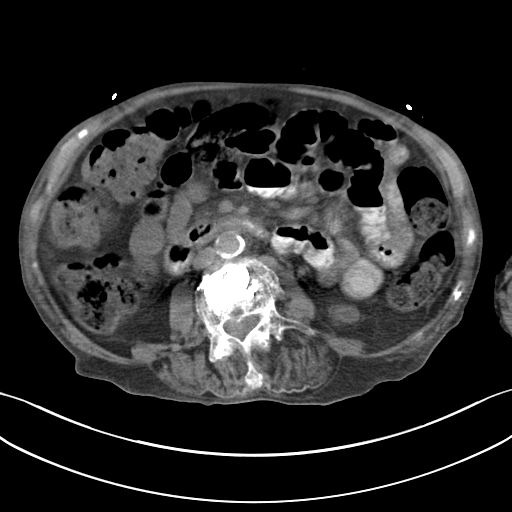
[im 62/93  soft-tissue]
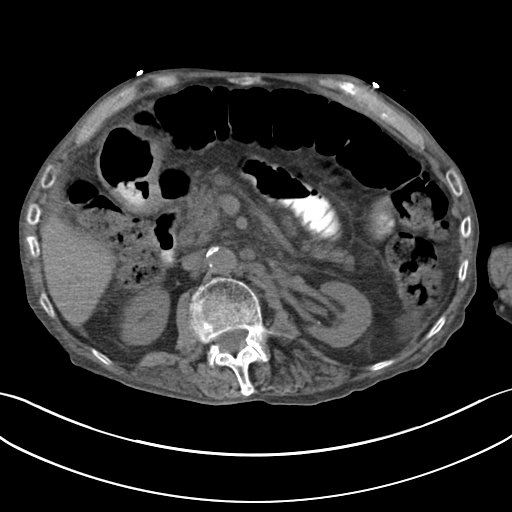
[im 62/93  bone]
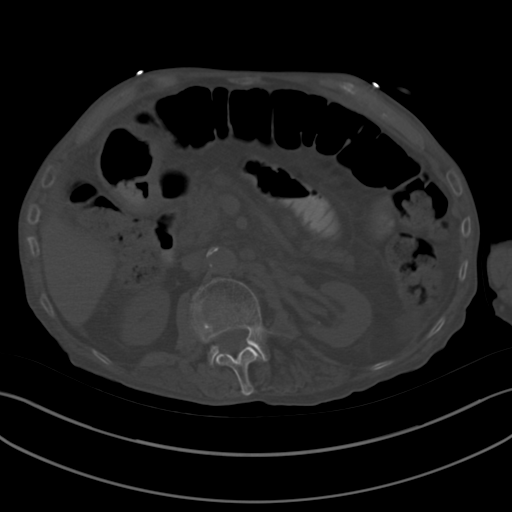
[im 66/93  soft-tissue]
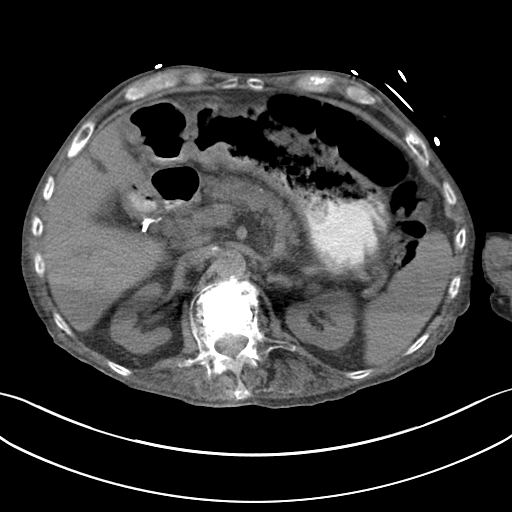
[im 73/93  soft-tissue]
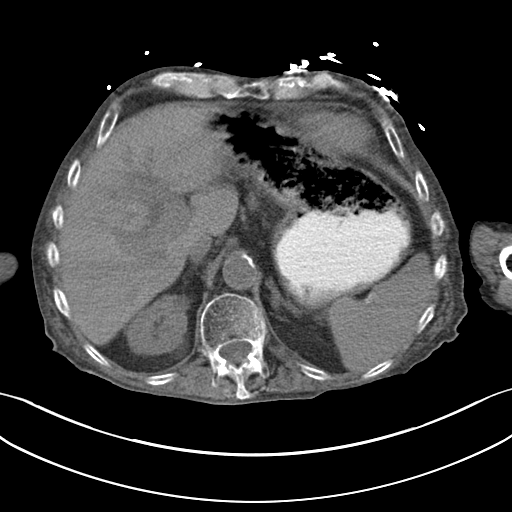
[im 81/93  soft-tissue]
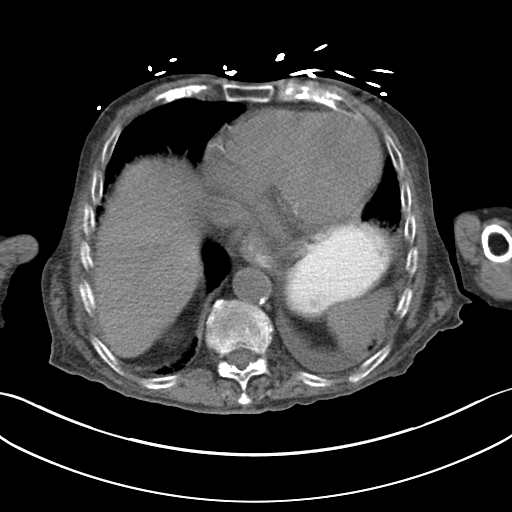
[im 89/93  soft-tissue]
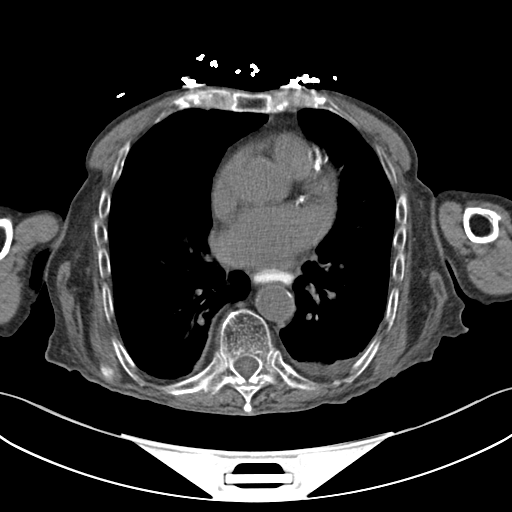

[Series 4: coronal · coronal · 0.74mm/px · 3 of 101 slices shown]
[im 34/101  soft-tissue]
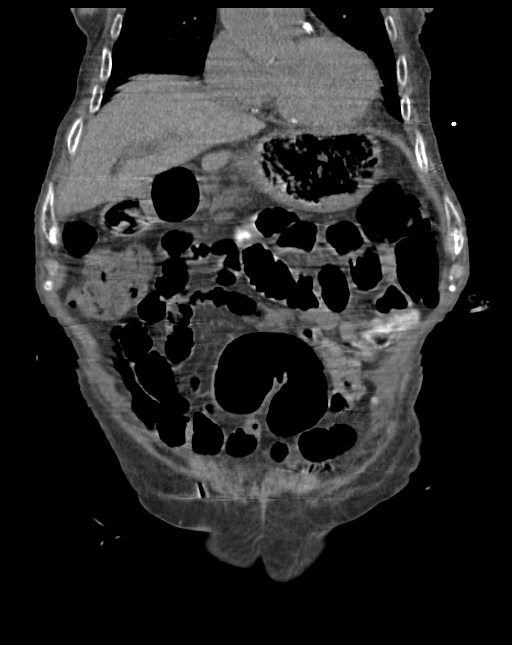
[im 45/101  soft-tissue]
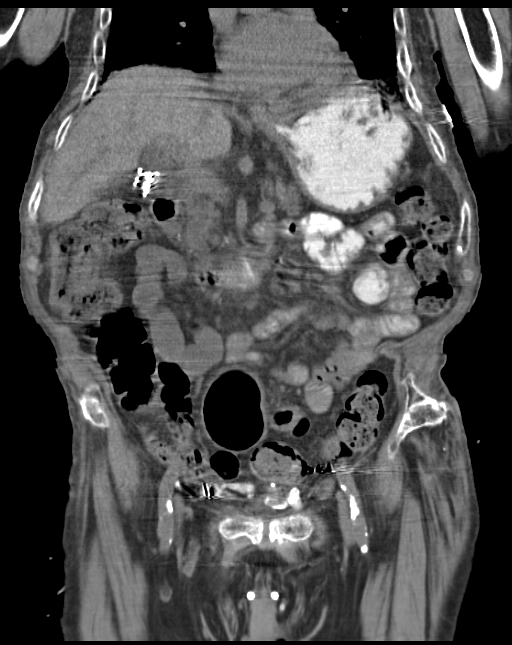
[im 56/101  soft-tissue]
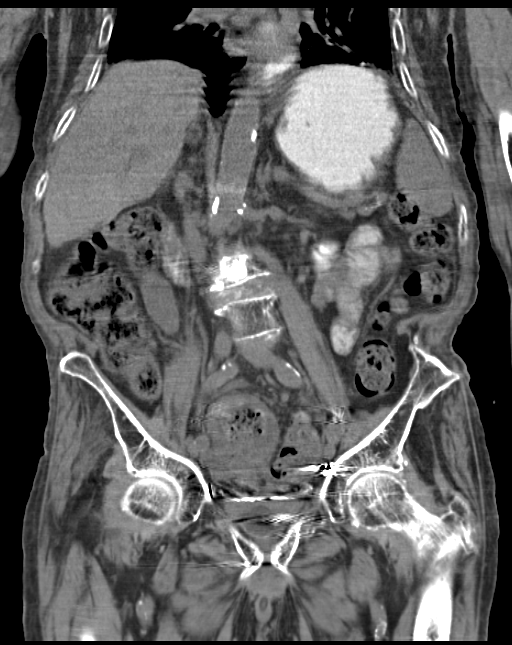

[16 of 46 positions shown; findings below may reference images not displayed]

FINDINGS: BODY WALL: Mild gynecomastia asymmetric to the left.

Mild anasarca.

LOWER CHEST: Gastroesophageal reflux. Small left pleural effusion
with dependent atelectasis.

ABDOMEN/PELVIS:

Liver: No focal abnormality.

Biliary: Cholecystectomy with mild enlargement of the common bile
duct, likely reservoir effect.

Pancreas: No acute finding.

Spleen: Unremarkable.

Adrenals: Unremarkable.

Kidneys and ureters: Bilateral renal cortical cysts. Bilateral renal
atrophy without hydronephrosis. Punctate bilateral renal
calcifications could be arterial or urinary.

Bladder: Decompressed by a Foley catheter.

Reproductive: Prostatectomy and pelvic lymphadenectomy. No evidence
of pelvic adenopathy. There is a penile prosthesis with deflated
reservoir ventrally.

Bowel: Circumferential thickening of the rectum with extensive
mesorectal edema. There is prominent colonic stool and gas above the
inflammation. No indication of previous radiation therapy. There is
stool within the inflamed rectum but not as large as usually seen
with stercoral colitis.

Retroperitoneum: Pelvic lymphadenectomy.  No evidence of adenopathy.

Peritoneum: No ascites or pneumoperitoneum.

Vascular: No acute abnormality.

OSSEOUS: Remote L1 and L3 compression fractures status post
vertebroplasty. Bilateral sacroplasty. Advanced lumbar degenerative
disc disease with dextroscoliosis. Profound osteopenia.
IMPRESSION: 1. Proctitis.
2. Moderate retention of stool and gas above the inflamed rectum.
3. Chronic findings are noted above.

## 2017-02-01 IMAGING — CR DG CHEST 1V PORT
1 series · 1 of 1 positions shown · non-contrast
Comparison: April 02, 2014

CLINICAL DATA: History hypertension and COPD. Status post fall
today.

EXAM:
PORTABLE CHEST - 1 VIEW

[AP]
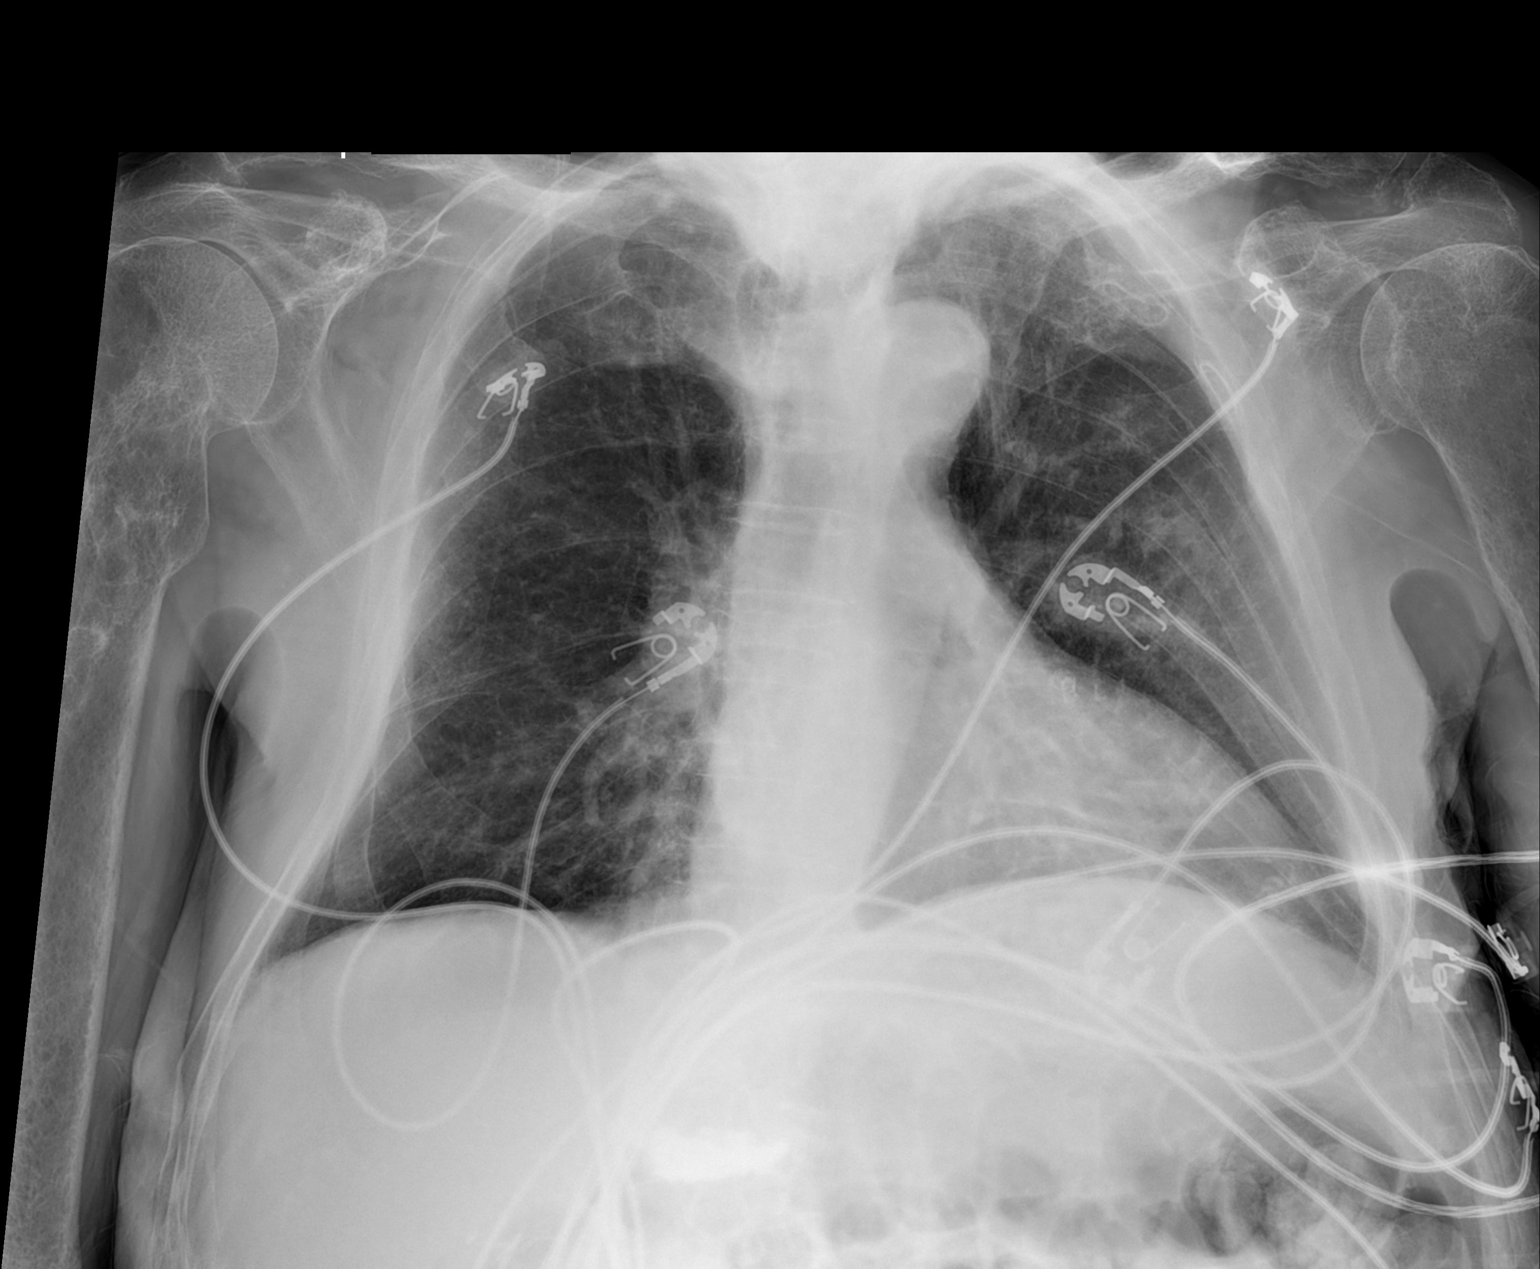

[1 of 1 positions shown; findings below may reference images not displayed]

FINDINGS: The heart size and mediastinal contours are stable. The heart size
is enlarged. There is no focal infiltrate, pulmonary edema, or
pleural effusion. Chronic mild opacity over the left mid lung
possibly calcified pleural plaques are unchanged. Chronic
deformities of the right humerus, left clavicle, multiple left ribs
are unchanged.
IMPRESSION: No active cardiopulmonary disease.  Stable chronic changes.
